# Patient Record
Sex: Female | Born: 1945 | Race: White | Hispanic: No | Marital: Married | Smoking: Never smoker
Health system: Southern US, Community
[De-identification: ages and names within clinical notes are randomized; demographics above are authoritative.]

## PROBLEM LIST (undated history)

## (undated) DIAGNOSIS — Z8489 Family history of other specified conditions: Secondary | ICD-10-CM

## (undated) DIAGNOSIS — K224 Dyskinesia of esophagus: Secondary | ICD-10-CM

## (undated) DIAGNOSIS — R011 Cardiac murmur, unspecified: Secondary | ICD-10-CM

## (undated) DIAGNOSIS — M5030 Other cervical disc degeneration, unspecified cervical region: Secondary | ICD-10-CM

## (undated) DIAGNOSIS — H18509 Unspecified hereditary corneal dystrophies, unspecified eye: Secondary | ICD-10-CM

## (undated) DIAGNOSIS — R1310 Dysphagia, unspecified: Secondary | ICD-10-CM

## (undated) DIAGNOSIS — F419 Anxiety disorder, unspecified: Secondary | ICD-10-CM

## (undated) DIAGNOSIS — B059 Measles without complication: Secondary | ICD-10-CM

## (undated) DIAGNOSIS — R112 Nausea with vomiting, unspecified: Secondary | ICD-10-CM

## (undated) DIAGNOSIS — K219 Gastro-esophageal reflux disease without esophagitis: Secondary | ICD-10-CM

## (undated) DIAGNOSIS — M5440 Lumbago with sciatica, unspecified side: Secondary | ICD-10-CM

## (undated) DIAGNOSIS — H9190 Unspecified hearing loss, unspecified ear: Secondary | ICD-10-CM

## (undated) DIAGNOSIS — G9050 Complex regional pain syndrome I, unspecified: Secondary | ICD-10-CM

## (undated) DIAGNOSIS — G629 Polyneuropathy, unspecified: Secondary | ICD-10-CM

## (undated) DIAGNOSIS — I499 Cardiac arrhythmia, unspecified: Secondary | ICD-10-CM

## (undated) DIAGNOSIS — H409 Unspecified glaucoma: Secondary | ICD-10-CM

## (undated) DIAGNOSIS — Z9889 Other specified postprocedural states: Secondary | ICD-10-CM

## (undated) DIAGNOSIS — J479 Bronchiectasis, uncomplicated: Secondary | ICD-10-CM

## (undated) DIAGNOSIS — Z8719 Personal history of other diseases of the digestive system: Secondary | ICD-10-CM

## (undated) DIAGNOSIS — Z85820 Personal history of malignant melanoma of skin: Secondary | ICD-10-CM

## (undated) DIAGNOSIS — M1990 Unspecified osteoarthritis, unspecified site: Secondary | ICD-10-CM

## (undated) DIAGNOSIS — I781 Nevus, non-neoplastic: Secondary | ICD-10-CM

## (undated) DIAGNOSIS — K5790 Diverticulosis of intestine, part unspecified, without perforation or abscess without bleeding: Secondary | ICD-10-CM

## (undated) DIAGNOSIS — H1850 Unspecified hereditary corneal dystrophies: Secondary | ICD-10-CM

## (undated) DIAGNOSIS — M810 Age-related osteoporosis without current pathological fracture: Secondary | ICD-10-CM

## (undated) DIAGNOSIS — R351 Nocturia: Secondary | ICD-10-CM

## (undated) DIAGNOSIS — I8390 Asymptomatic varicose veins of unspecified lower extremity: Secondary | ICD-10-CM

## (undated) DIAGNOSIS — F4310 Post-traumatic stress disorder, unspecified: Secondary | ICD-10-CM

## (undated) DIAGNOSIS — L719 Rosacea, unspecified: Secondary | ICD-10-CM

## (undated) DIAGNOSIS — Z8619 Personal history of other infectious and parasitic diseases: Secondary | ICD-10-CM

## (undated) DIAGNOSIS — M109 Gout, unspecified: Secondary | ICD-10-CM

## (undated) DIAGNOSIS — R32 Unspecified urinary incontinence: Secondary | ICD-10-CM

## (undated) DIAGNOSIS — E039 Hypothyroidism, unspecified: Secondary | ICD-10-CM

## (undated) DIAGNOSIS — I341 Nonrheumatic mitral (valve) prolapse: Secondary | ICD-10-CM

## (undated) DIAGNOSIS — T4145XA Adverse effect of unspecified anesthetic, initial encounter: Secondary | ICD-10-CM

## (undated) DIAGNOSIS — H269 Unspecified cataract: Secondary | ICD-10-CM

## (undated) DIAGNOSIS — T8859XA Other complications of anesthesia, initial encounter: Secondary | ICD-10-CM

## (undated) HISTORY — DX: Bronchiectasis, uncomplicated: J47.9

## (undated) HISTORY — PX: OTHER SURGICAL HISTORY: SHX169

## (undated) HISTORY — DX: Personal history of malignant melanoma of skin: Z85.820

## (undated) NOTE — *Deleted (*Deleted)
Anaphylactic shock due to food Avoid peanut, tree nuts, egg, milk, fish, shellfish,and pork. In case of an allergic reaction, give Benadryl *** {Blank single:19197::"teaspoonful","teaspoonfuls","capsules"} every {blank single:19197::"4","6"} hours, and if life-threatening symptoms occur, inject with {Blank single:19197::"EpiPen 0.3 mg","EpiPen 0.15 mg","AuviQ 0.3 mg","AuviQ 0.15 mg","AuviQ 0.10 mg"}.  Flexural atopic dermatitis Take a daily bath for 10 minutes, then pat dry and use hydrocortisone 2.5% cream twice a day as needed to red itchy areas. Wait 10 minutes and then use Eucerin cream on the bad areas of eczema and use Eucerin lotion on the other areas.  Allergic rhinitis (dust mite, cat and dog) Continue cetirizine 1 teaspoon twice day as need for itchy skin or runny nose  Celiac disease Continue to avoid gluten and also oats  Please let us know if this treatment plan is not working well for you Schedule follow up appointment in

---

## 1991-02-25 DIAGNOSIS — G9050 Complex regional pain syndrome I, unspecified: Secondary | ICD-10-CM

## 1991-02-25 HISTORY — DX: Complex regional pain syndrome I, unspecified: G90.50

## 2004-02-25 DIAGNOSIS — F4310 Post-traumatic stress disorder, unspecified: Secondary | ICD-10-CM

## 2004-02-25 HISTORY — PX: HIP FRACTURE SURGERY: SHX118

## 2004-02-25 HISTORY — DX: Post-traumatic stress disorder, unspecified: F43.10

## 2013-06-06 ENCOUNTER — Other Ambulatory Visit (HOSPITAL_COMMUNITY): Payer: Self-pay | Admitting: Internal Medicine

## 2013-06-06 DIAGNOSIS — R1310 Dysphagia, unspecified: Secondary | ICD-10-CM

## 2013-06-09 ENCOUNTER — Ambulatory Visit (HOSPITAL_COMMUNITY)
Admission: RE | Admit: 2013-06-09 | Discharge: 2013-06-09 | Disposition: A | Discharge status: Home / Self Care | Payer: Medicare Other | Source: Ambulatory Visit | Attending: Internal Medicine | Admitting: Internal Medicine

## 2013-06-09 DIAGNOSIS — K219 Gastro-esophageal reflux disease without esophagitis: Secondary | ICD-10-CM | POA: Insufficient documentation

## 2013-06-09 DIAGNOSIS — K2289 Other specified disease of esophagus: Secondary | ICD-10-CM | POA: Insufficient documentation

## 2013-06-09 DIAGNOSIS — K228 Other specified diseases of esophagus: Secondary | ICD-10-CM | POA: Insufficient documentation

## 2013-06-09 DIAGNOSIS — K449 Diaphragmatic hernia without obstruction or gangrene: Secondary | ICD-10-CM | POA: Insufficient documentation

## 2013-06-09 DIAGNOSIS — R1310 Dysphagia, unspecified: Secondary | ICD-10-CM

## 2013-08-17 ENCOUNTER — Other Ambulatory Visit (HOSPITAL_COMMUNITY): Payer: Self-pay | Admitting: Internal Medicine

## 2013-08-17 DIAGNOSIS — R1012 Left upper quadrant pain: Secondary | ICD-10-CM

## 2013-08-17 DIAGNOSIS — R1031 Right lower quadrant pain: Secondary | ICD-10-CM

## 2013-08-22 ENCOUNTER — Ambulatory Visit (HOSPITAL_COMMUNITY)
Admission: RE | Admit: 2013-08-22 | Discharge: 2013-08-22 | Disposition: A | Discharge status: Home / Self Care | Payer: Medicare Other | Source: Ambulatory Visit | Attending: Internal Medicine | Admitting: Internal Medicine

## 2013-08-22 DIAGNOSIS — R1012 Left upper quadrant pain: Secondary | ICD-10-CM

## 2013-08-22 DIAGNOSIS — R1031 Right lower quadrant pain: Secondary | ICD-10-CM

## 2013-08-22 DIAGNOSIS — K824 Cholesterolosis of gallbladder: Secondary | ICD-10-CM | POA: Insufficient documentation

## 2013-09-05 ENCOUNTER — Ambulatory Visit: Payer: Medicare Other | Attending: Orthopedic Surgery | Admitting: Physical Therapy

## 2013-09-05 DIAGNOSIS — M545 Low back pain, unspecified: Secondary | ICD-10-CM | POA: Diagnosis not present

## 2013-09-07 ENCOUNTER — Ambulatory Visit: Payer: Medicare Other | Admitting: Physical Therapy

## 2013-09-07 DIAGNOSIS — M545 Low back pain, unspecified: Secondary | ICD-10-CM | POA: Diagnosis not present

## 2013-09-13 ENCOUNTER — Ambulatory Visit: Payer: Medicare Other | Admitting: Physical Therapy

## 2013-09-15 ENCOUNTER — Ambulatory Visit: Payer: Medicare Other | Admitting: Physical Therapy

## 2013-09-15 DIAGNOSIS — M545 Low back pain, unspecified: Secondary | ICD-10-CM | POA: Diagnosis not present

## 2013-09-20 ENCOUNTER — Ambulatory Visit: Payer: Medicare Other | Admitting: Physical Therapy

## 2013-09-20 DIAGNOSIS — M545 Low back pain, unspecified: Secondary | ICD-10-CM | POA: Diagnosis not present

## 2013-09-22 ENCOUNTER — Other Ambulatory Visit: Payer: Self-pay | Admitting: Orthopedic Surgery

## 2013-09-22 ENCOUNTER — Ambulatory Visit: Payer: Medicare Other | Admitting: Physical Therapy

## 2013-09-22 DIAGNOSIS — M545 Low back pain, unspecified: Secondary | ICD-10-CM | POA: Diagnosis not present

## 2013-09-27 ENCOUNTER — Ambulatory Visit: Payer: Medicare Other | Attending: Orthopedic Surgery | Admitting: Physical Therapy

## 2013-09-27 DIAGNOSIS — M545 Low back pain, unspecified: Secondary | ICD-10-CM | POA: Insufficient documentation

## 2013-09-29 ENCOUNTER — Ambulatory Visit: Payer: Medicare Other | Admitting: Physical Therapy

## 2013-09-29 DIAGNOSIS — M545 Low back pain, unspecified: Secondary | ICD-10-CM | POA: Diagnosis not present

## 2013-10-04 ENCOUNTER — Ambulatory Visit: Payer: Medicare Other | Admitting: Physical Therapy

## 2013-10-04 ENCOUNTER — Encounter (HOSPITAL_COMMUNITY): Payer: Medicare Other | Admitting: Pharmacy Technician

## 2013-10-04 DIAGNOSIS — M545 Low back pain, unspecified: Secondary | ICD-10-CM | POA: Diagnosis not present

## 2013-10-06 ENCOUNTER — Ambulatory Visit: Payer: Medicare Other | Admitting: Physical Therapy

## 2013-10-06 DIAGNOSIS — M545 Low back pain, unspecified: Secondary | ICD-10-CM | POA: Diagnosis not present

## 2013-10-06 NOTE — Patient Instructions (Addendum)
Brooke Jimenez  10/06/2013   Your procedure is scheduled on:  10/17/2013    Report to [facility].  Follow the Signs to [place] at  105pm  Call this number if you have problems the morning of surgery: [phone]   Remember:   Do not eat food after midnite.  May have clear liquids until 0830am then npo.    Take these medicines the morning of surgery with A SIP OF WATER:    Do not wear jewelry, make-up or nail polish.  Do not wear lotions, powders, or perfumes.,  deodorant    Do not shave 48 hours prior to surgery.   Do not bring valuables to the hospital.  Contacts, dentures or bridgework may not be worn into surgery.       Patients discharged the day of surgery will not be allowed to drive  home.  Name and phone number of your driver:       Please read over the following fact sheets that you were given: [facility] - Preparing for Surgery Before surgery, you can play an important role.  Because skin is not sterile, your skin needs to be as free of germs as possible.  You can reduce the number of germs on your skin by washing with CHG (chlorahexidine gluconate) soap before surgery.  CHG is an antiseptic cleaner which kills germs and bonds with the skin to continue killing germs even after washing. Please DO NOT use if you have an allergy to CHG or antibacterial soaps.  If your skin becomes reddened/irritated stop using the CHG and inform your nurse when you arrive at Short Stay. Do not shave (including legs and underarms) for at least 48 hours prior to the first CHG shower.  You may shave your face/neck. Please follow these instructions carefully:  1.  Shower with CHG Soap the night before surgery and the  morning of Surgery.  2.  If you choose to wash your hair, wash your hair first as usual with your  normal  shampoo.  3.  After you shampoo, rinse your hair and body thoroughly to remove the  shampoo.                           4.  Use CHG as you would any other  liquid soap.  You can apply chg directly  to the skin and wash                       Gently with a scrungie or clean washcloth.  5.  Apply the CHG Soap to your body ONLY FROM THE NECK DOWN.   Do not use on face/ open                           Wound or open sores. Avoid contact with eyes, ears mouth and genitals (private parts).                       Wash face,  Genitals (private parts) with your normal soap.             6.  Wash thoroughly, paying special attention to the area where your surgery  will be performed.  7.  Thoroughly rinse your body with warm water from the neck down.  8.  DO NOT shower/wash with your normal soap after using and rinsing  off  the CHG Soap.                9.  Pat yourself dry with a clean towel.            10.  Wear clean pajamas.            11.  Place clean sheets on your bed the night of your first shower and do not  sleep with pets. Day of Surgery : Do not apply any lotions/deodorants the morning of surgery.  Please wear clean clothes to the hospital/surgery center.  FAILURE TO FOLLOW THESE INSTRUCTIONS MAY RESULT IN THE CANCELLATION OF YOUR SURGERY PATIENT SIGNATURE_________________________________  NURSE SIGNATURE__________________________________  ________________________________________________________________________   Brooke Jimenez  An incentive spirometer is a tool that can help keep your lungs clear and active. This tool measures how well you are filling your lungs with each breath. Taking long deep breaths may help reverse or decrease the chance of developing breathing (pulmonary) problems (especially infection) following:  A long period of time when you are unable to move or be active. BEFORE THE PROCEDURE   If the spirometer includes an indicator to show your best effort, your nurse or respiratory therapist will set it to a desired goal.  If possible, sit up straight or lean slightly forward. Try not to slouch.  Hold the incentive  spirometer in an upright position. INSTRUCTIONS FOR USE  1. Sit on the edge of your bed if possible, or sit up as far as you can in bed or on a chair. 2. Hold the incentive spirometer in an upright position. 3. Breathe out normally. 4. Place the mouthpiece in your mouth and seal your lips tightly around it. 5. Breathe in slowly and as deeply as possible, raising the piston or the ball toward the top of the column. 6. Hold your breath for 3-5 seconds or for as long as possible. Allow the piston or ball to fall to the bottom of the column. 7. Remove the mouthpiece from your mouth and breathe out normally. 8. Rest for a few seconds and repeat Steps 1 through 7 at least 10 times every 1-2 hours when you are awake. Take your time and take a few normal breaths between deep breaths. 9. The spirometer may include an indicator to show your best effort. Use the indicator as a goal to work toward during each repetition. 10. After each set of 10 deep breaths, practice coughing to be sure your lungs are clear. If you have an incision (the cut made at the time of surgery), support your incision when coughing by placing a pillow or rolled up towels firmly against it. Once you are able to get out of bed, walk around indoors and cough well. You may stop using the incentive spirometer when instructed by your caregiver.  RISKS AND COMPLICATIONS  Take your time so you do not get dizzy or light-headed.  If you are in pain, you may need to take or ask for pain medication before doing incentive spirometry. It is harder to take a deep breath if you are having pain. AFTER USE  Rest and breathe slowly and easily.  It can be helpful to keep track of a log of your progress. Your caregiver can provide you with a simple table to help with this. If you are using the spirometer at home, follow these instructions: [place] IF:   You are having difficultly using the spirometer.  You have trouble using the  spirometer as often as instructed.  Your pain medication is not giving enough relief while using the spirometer.  You develop fever of 100.5 F (38.1 C) or higher. SEEK IMMEDIATE MEDICAL CARE IF:   You cough up bloody sputum that had not been present before.  You develop fever of 102 F (38.9 C) or greater.  You develop worsening pain at or near the incision site. MAKE SURE YOU:   Understand these instructions.  Will watch your condition.  Will get help right away if you are not doing well or get worse. Document Released: 06/23/2006 Document Revised: 05/05/2011 Document Reviewed: 08/24/2006 ExitCare Patient Information 2014 Exit[employer], [place].   ________________________________________________________________________ , coughing and deep breathing exercises, leg exercises               CLEAR LIQUID DIET   Foods Allowed                                                                     Foods Excluded  Coffee and tea, regular and decaf                             liquids that you cannot  Plain Jell-O in any flavor                                             see through such as: Fruit ices (not with fruit pulp)                                     milk, soups, orange juice  Iced Popsicles                                    All solid food Carbonated beverages, regular and diet                                    Cranberry, grape and apple juices Sports drinks like Gatorade Lightly seasoned clear broth or consume(fat free) Sugar, honey syrup  Sample Menu Breakfast                                Lunch                                     Supper Cranberry juice                    Beef broth                            Chicken broth Jell-O  Grape juice                           Apple juice Coffee or tea                        Jell-O                                      Popsicle                                                Coffee or tea                         Coffee or tea  _____________________________________________________________________

## 2013-10-07 ENCOUNTER — Encounter (HOSPITAL_COMMUNITY): Payer: Self-pay

## 2013-10-07 ENCOUNTER — Ambulatory Visit (HOSPITAL_COMMUNITY)
Admission: RE | Admit: 2013-10-07 | Discharge: 2013-10-07 | Disposition: A | Discharge status: Home / Self Care | Payer: Medicare Other | Source: Ambulatory Visit | Attending: Orthopedic Surgery | Admitting: Orthopedic Surgery

## 2013-10-07 ENCOUNTER — Encounter (HOSPITAL_COMMUNITY)
Admission: RE | Admit: 2013-10-07 | Discharge: 2013-10-07 | Disposition: A | Discharge status: Home / Self Care | Payer: Medicare Other | Source: Ambulatory Visit | Attending: Orthopedic Surgery | Admitting: Orthopedic Surgery

## 2013-10-07 DIAGNOSIS — Z01818 Encounter for other preprocedural examination: Secondary | ICD-10-CM | POA: Diagnosis present

## 2013-10-07 HISTORY — DX: Unspecified hereditary corneal dystrophies: H18.50

## 2013-10-07 HISTORY — DX: Post-traumatic stress disorder, unspecified: F43.10

## 2013-10-07 HISTORY — DX: Personal history of other diseases of the digestive system: Z87.19

## 2013-10-07 HISTORY — DX: Gastro-esophageal reflux disease without esophagitis: K21.9

## 2013-10-07 HISTORY — DX: Anxiety disorder, unspecified: F41.9

## 2013-10-07 HISTORY — DX: Unspecified hereditary corneal dystrophies, unspecified eye: H18.509

## 2013-10-07 HISTORY — DX: Nonrheumatic mitral (valve) prolapse: I34.1

## 2013-10-07 HISTORY — DX: Dysphagia, unspecified: R13.10

## 2013-10-07 HISTORY — DX: Unspecified osteoarthritis, unspecified site: M19.90

## 2013-10-07 HISTORY — DX: Hypothyroidism, unspecified: E03.9

## 2013-10-07 LAB — BASIC METABOLIC PANEL
ANION GAP: 11 (ref 5–15)
BUN: 13 mg/dL (ref 6–23)
CHLORIDE: 102 meq/L (ref 96–112)
CO2: 25 mEq/L (ref 19–32)
Calcium: 10.4 mg/dL (ref 8.4–10.5)
Creatinine, Ser: 0.63 mg/dL (ref 0.50–1.10)
GFR calc Af Amer: >90 mL/min (ref >90)
GFR calc non Af Amer: >90 mL/min (ref >90)
Glucose, Bld: 91 mg/dL (ref 70–99)
POTASSIUM: 4.4 meq/L (ref 3.7–5.3)
SODIUM: 138 meq/L (ref 137–147)

## 2013-10-07 LAB — CBC
HCT: 42.9 % (ref 36.0–46.0)
Hemoglobin: 14 g/dL (ref 12.0–15.0)
MCH: 29 pg (ref 26.0–34.0)
MCHC: 32.6 g/dL (ref 30.0–36.0)
MCV: 88.8 fL (ref 78.0–100.0)
Platelets: 258 10*3/uL (ref 150–400)
RBC: 4.83 MIL/uL (ref 3.87–5.11)
RDW: 13.8 % (ref 11.5–15.5)
WBC: 7.5 10*3/uL (ref 4.0–10.5)

## 2013-10-07 NOTE — Progress Notes (Signed)
Please see ultrasound done 06/09/2013 in [facility].    Due to chronic dysphagia

## 2013-10-07 NOTE — Progress Notes (Signed)
Patient has allergy to Penicillin- causes rash .  Ancef ordered preop

## 2013-10-07 NOTE — Progress Notes (Signed)
EKG and CXR done 10/07/13 in [facility].

## 2013-10-11 ENCOUNTER — Ambulatory Visit: Payer: Medicare Other | Admitting: Physical Therapy

## 2013-10-11 DIAGNOSIS — M545 Low back pain, unspecified: Secondary | ICD-10-CM | POA: Diagnosis not present

## 2013-10-12 ENCOUNTER — Ambulatory Visit: Payer: Medicare Other | Admitting: Physical Therapy

## 2013-10-12 DIAGNOSIS — M545 Low back pain, unspecified: Secondary | ICD-10-CM | POA: Diagnosis not present

## 2013-10-16 NOTE — H&P (Signed)
  CC- Brooke Jimenez is a 68 y.o. female who presents with left hip pain  Hip Pain: Patient complains of left hip pain. Onset of the symptoms was several years ago. Inciting event: pinning of left hip fracture. Current symptoms include lateral hip and groin pain whic worsens with lying on left side. She also has mild OA of the hip and an intraarticular injection was performed to help differentiate intra vs extraarticular source of pain. She di not have any improvement withh the injection suggesting that the problem was not coming from her joint.She has palpable tenderness over the greqter trochanter consistent with being from the hip hardware.She presents now for hardware removal of the left hip..   Past Medical History  Diagnosis Date  . Mitral valve prolapse   . Hypothyroidism   . Anxiety   . Urinary frequency   . GERD (gastroesophageal reflux disease)   . H/O hiatal hernia   . Arthritis   . Cancer     melanoma- 1986 and 1997   . PTSD (post-traumatic stress disorder) 2006  . Corneal dystrophy   . Dysphagia     chronic- please see ultrasound done 4/16 15 in [facility]     Past Surgical History  Procedure Laterality Date  . Joint replacement      left hip replacement   . Melanoma surgery       Prior to Admission medications   Medication Sig Start Date End Date Taking? Authorizing Provider  acetaminophen (TYLENOL) 500 MG tablet Take 500 mg by mouth every 6 (six) hours as needed for mild pain.    Historical Provider, MD  clindamycin (CLEOCIN) 150 MG capsule Take 600 mg by mouth once.    Historical Provider, MD  ibuprofen (ADVIL,MOTRIN) 200 MG tablet Take 200 mg by mouth every 6 (six) hours as needed for mild pain or moderate pain.    Historical Provider, MD  levothyroxine (SYNTHROID, LEVOTHROID) 50 MCG tablet Take 50 mcg by mouth daily before breakfast.    Historical Provider, MD  minocycline (MINOCIN,DYNACIN) 50 MG capsule Take 50 mg by mouth 2 (two) times daily.    Historical Provider,  MD    Physical Examination: General appearance - alert, well appearing, and in no distress Mental status - alert, oriented to person, place, and time Chest - clear to auscultation, no wheezes, rales or rhonchi, symmetric air entry Heart - normal rate, regular rhythm, normal S1, S2, no murmurs, rubs, clicks or gallops Abdomen - soft, nontender, nondistended, no masses or organomegaly Neurological - alert, oriented, normal speech, no focal findings or movement disorder noted  A left hip exam was performed. SKIN: intact SWELLING: none WARMTH: no warmth TENDERNESS: maximal at greater trochanter ROM: normal STRENGTH: normal GAIT: antalgic  ASSESSMENT:Left hip pain- Plan removal of hardware (cannulated screws) left hip. Discussed procedure, risks, potential complications and rehab course with patient who elects to proceed.  Plan  Dione Plover. Osha Errico, MD    10/16/2013, 8:55 PM

## 2013-10-17 ENCOUNTER — Ambulatory Visit (HOSPITAL_COMMUNITY): Payer: Medicare Other | Admitting: Anesthesiology

## 2013-10-17 ENCOUNTER — Ambulatory Visit (HOSPITAL_COMMUNITY)
Admission: RE | Admit: 2013-10-17 | Discharge: 2013-10-17 | Disposition: A | Discharge status: Home / Self Care | Payer: Medicare Other | Source: Ambulatory Visit | Attending: Orthopedic Surgery | Admitting: Orthopedic Surgery

## 2013-10-17 ENCOUNTER — Encounter (HOSPITAL_COMMUNITY)
Admission: RE | Disposition: A | Discharge status: Home / Self Care | Payer: Self-pay | Source: Ambulatory Visit | Attending: Orthopedic Surgery

## 2013-10-17 ENCOUNTER — Encounter (HOSPITAL_COMMUNITY): Payer: Medicare Other | Admitting: Anesthesiology

## 2013-10-17 ENCOUNTER — Encounter (HOSPITAL_COMMUNITY): Payer: Self-pay | Admitting: *Deleted

## 2013-10-17 DIAGNOSIS — K449 Diaphragmatic hernia without obstruction or gangrene: Secondary | ICD-10-CM | POA: Insufficient documentation

## 2013-10-17 DIAGNOSIS — F430 Acute stress reaction: Secondary | ICD-10-CM | POA: Insufficient documentation

## 2013-10-17 DIAGNOSIS — Y9210 Unspecified residential institution as the place of occurrence of the external cause: Secondary | ICD-10-CM | POA: Insufficient documentation

## 2013-10-17 DIAGNOSIS — Y831 Surgical operation with implant of artificial internal device as the cause of abnormal reaction of the patient, or of later complication, without mention of misadventure at the time of the procedure: Secondary | ICD-10-CM | POA: Diagnosis not present

## 2013-10-17 DIAGNOSIS — F411 Generalized anxiety disorder: Secondary | ICD-10-CM | POA: Diagnosis not present

## 2013-10-17 DIAGNOSIS — E039 Hypothyroidism, unspecified: Secondary | ICD-10-CM | POA: Insufficient documentation

## 2013-10-17 DIAGNOSIS — I059 Rheumatic mitral valve disease, unspecified: Secondary | ICD-10-CM | POA: Insufficient documentation

## 2013-10-17 DIAGNOSIS — Z79899 Other long term (current) drug therapy: Secondary | ICD-10-CM | POA: Insufficient documentation

## 2013-10-17 DIAGNOSIS — T8489XA Other specified complication of internal orthopedic prosthetic devices, implants and grafts, initial encounter: Secondary | ICD-10-CM | POA: Diagnosis not present

## 2013-10-17 DIAGNOSIS — K219 Gastro-esophageal reflux disease without esophagitis: Secondary | ICD-10-CM | POA: Insufficient documentation

## 2013-10-17 DIAGNOSIS — T8484XA Pain due to internal orthopedic prosthetic devices, implants and grafts, initial encounter: Secondary | ICD-10-CM | POA: Diagnosis present

## 2013-10-17 DIAGNOSIS — M25559 Pain in unspecified hip: Secondary | ICD-10-CM | POA: Insufficient documentation

## 2013-10-17 DIAGNOSIS — T8484XD Pain due to internal orthopedic prosthetic devices, implants and grafts, subsequent encounter: Secondary | ICD-10-CM

## 2013-10-17 HISTORY — DX: Unspecified hearing loss, unspecified ear: H91.90

## 2013-10-17 HISTORY — PX: HARDWARE REMOVAL: SHX979

## 2013-10-17 SURGERY — REMOVAL, HARDWARE
Anesthesia: General | Site: Hip | Laterality: Left

## 2013-10-17 MED ORDER — HYDROMORPHONE HCL PF 1 MG/ML IJ SOLN: 0.2500 mg | INTRAMUSCULAR | Status: DC | PRN

## 2013-10-17 MED ORDER — SUCCINYLCHOLINE CHLORIDE 20 MG/ML IJ SOLN
INTRAMUSCULAR | Status: DC | PRN
  Administered 2013-10-17: 100 mg via INTRAVENOUS

## 2013-10-17 MED ORDER — LIDOCAINE HCL (CARDIAC) 20 MG/ML IV SOLN
INTRAVENOUS | Status: AC
  Filled 2013-10-17: qty 5

## 2013-10-17 MED ORDER — DEXAMETHASONE SODIUM PHOSPHATE 10 MG/ML IJ SOLN: 10.0000 mg | Freq: Once | INTRAMUSCULAR | Status: DC

## 2013-10-17 MED ORDER — SODIUM CHLORIDE 0.9 % IJ SOLN
INTRAMUSCULAR | Status: AC
  Filled 2013-10-17: qty 50

## 2013-10-17 MED ORDER — CEFAZOLIN SODIUM-DEXTROSE 2-3 GM-% IV SOLR
INTRAVENOUS | Status: AC
  Filled 2013-10-17: qty 50

## 2013-10-17 MED ORDER — TRAMADOL HCL 50 MG PO TABS: 50.0000 mg | ORAL_TABLET | Freq: Four times a day (QID) | ORAL | Status: DC | PRN

## 2013-10-17 MED ORDER — MIDAZOLAM HCL 5 MG/5ML IJ SOLN
INTRAMUSCULAR | Status: DC | PRN
  Administered 2013-10-17: 2 mg via INTRAVENOUS

## 2013-10-17 MED ORDER — FENTANYL CITRATE 0.05 MG/ML IJ SOLN
INTRAMUSCULAR | Status: DC | PRN
  Administered 2013-10-17 (×2): 50 ug via INTRAVENOUS

## 2013-10-17 MED ORDER — MIDAZOLAM HCL 2 MG/2ML IJ SOLN
INTRAMUSCULAR | Status: AC
  Filled 2013-10-17: qty 2

## 2013-10-17 MED ORDER — DEXAMETHASONE SODIUM PHOSPHATE 10 MG/ML IJ SOLN
INTRAMUSCULAR | Status: DC | PRN
  Administered 2013-10-17: 10 mg via INTRAVENOUS

## 2013-10-17 MED ORDER — PROPOFOL 10 MG/ML IV BOLUS
INTRAVENOUS | Status: DC | PRN
  Administered 2013-10-17: 150 mg via INTRAVENOUS

## 2013-10-17 MED ORDER — DEXAMETHASONE SODIUM PHOSPHATE 10 MG/ML IJ SOLN
INTRAMUSCULAR | Status: AC
  Filled 2013-10-17: qty 1

## 2013-10-17 MED ORDER — PROPOFOL 10 MG/ML IV BOLUS
INTRAVENOUS | Status: AC
  Filled 2013-10-17: qty 20

## 2013-10-17 MED ORDER — ONDANSETRON HCL 4 MG/2ML IJ SOLN
INTRAMUSCULAR | Status: DC | PRN
  Administered 2013-10-17: 4 mg via INTRAVENOUS

## 2013-10-17 MED ORDER — EPHEDRINE SULFATE 50 MG/ML IJ SOLN
INTRAMUSCULAR | Status: DC | PRN
  Administered 2013-10-17 (×3): 5 mg via INTRAVENOUS

## 2013-10-17 MED ORDER — FENTANYL CITRATE 0.05 MG/ML IJ SOLN
INTRAMUSCULAR | Status: AC
  Filled 2013-10-17: qty 5

## 2013-10-17 MED ORDER — LACTATED RINGERS IV SOLN
INTRAVENOUS | Status: DC | PRN
  Administered 2013-10-17: 15:00:00 via INTRAVENOUS

## 2013-10-17 MED ORDER — ONDANSETRON HCL 4 MG/2ML IJ SOLN
INTRAMUSCULAR | Status: AC
  Filled 2013-10-17: qty 2

## 2013-10-17 MED ORDER — PROMETHAZINE HCL 25 MG/ML IJ SOLN: 6.2500 mg | INTRAMUSCULAR | Status: DC | PRN

## 2013-10-17 MED ORDER — BUPIVACAINE HCL (PF) 0.25 % IJ SOLN
INTRAMUSCULAR | Status: AC
  Filled 2013-10-17: qty 30

## 2013-10-17 MED ORDER — METHOCARBAMOL 500 MG PO TABS: 500.0000 mg | ORAL_TABLET | Freq: Four times a day (QID) | ORAL | Status: DC

## 2013-10-17 MED ORDER — ACETAMINOPHEN 10 MG/ML IV SOLN
1000.0000 mg | Freq: Once | INTRAVENOUS | Status: AC
  Administered 2013-10-17: 1000 mg via INTRAVENOUS
  Filled 2013-10-17: qty 100

## 2013-10-17 MED ORDER — 0.9 % SODIUM CHLORIDE (POUR BTL) OPTIME
TOPICAL | Status: DC | PRN
  Administered 2013-10-17: 1000 mL

## 2013-10-17 MED ORDER — LIDOCAINE HCL (CARDIAC) 20 MG/ML IV SOLN
INTRAVENOUS | Status: DC | PRN
  Administered 2013-10-17: 80 mg via INTRAVENOUS

## 2013-10-17 MED ORDER — CEFAZOLIN SODIUM-DEXTROSE 2-3 GM-% IV SOLR
2.0000 g | INTRAVENOUS | Status: AC
  Administered 2013-10-17: 2 g via INTRAVENOUS

## 2013-10-17 MED ORDER — SODIUM CHLORIDE 0.9 % IV SOLN: INTRAVENOUS | Status: DC

## 2013-10-17 MED ORDER — CHLORHEXIDINE GLUCONATE 4 % EX LIQD: 60.0000 mL | Freq: Once | CUTANEOUS | Status: DC

## 2013-10-17 SURGICAL SUPPLY — 43 items
BANDAGE ELASTIC 6 VELCRO ST LF (GAUZE/BANDAGES/DRESSINGS) IMPLANT
BANDAGE ESMARK 6X9 LF (GAUZE/BANDAGES/DRESSINGS) IMPLANT
BNDG ESMARK 6X9 LF (GAUZE/BANDAGES/DRESSINGS)
CUFF TOURN SGL QUICK 18 (TOURNIQUET CUFF) IMPLANT
CUFF TOURN SGL QUICK 34 (TOURNIQUET CUFF)
CUFF TRNQT CYL 34X4X40X1 (TOURNIQUET CUFF) IMPLANT
DRAPE C-ARM 42X120 X-RAY (DRAPES) IMPLANT
DRAPE C-ARMOR (DRAPES) IMPLANT
DRAPE EXTREMITY T 121X128X90 (DRAPE) ×2 IMPLANT
DRAPE INCISE IOBAN 66X45 STRL (DRAPES) ×2 IMPLANT
DRAPE ORTHO SPLIT 77X108 STRL (DRAPES) ×2
DRAPE SURG ORHT 6 SPLT 77X108 (DRAPES) ×2 IMPLANT
DRSG ADAPTIC 3X8 NADH LF (GAUZE/BANDAGES/DRESSINGS) IMPLANT
DRSG MEPILEX BORDER 4X4 (GAUZE/BANDAGES/DRESSINGS) ×2 IMPLANT
DRSG PAD ABDOMINAL 8X10 ST (GAUZE/BANDAGES/DRESSINGS) IMPLANT
DURAPREP 26ML APPLICATOR (WOUND CARE) ×2 IMPLANT
ELECT REM PT RETURN 9FT ADLT (ELECTROSURGICAL) ×2
ELECTRODE REM PT RTRN 9FT ADLT (ELECTROSURGICAL) ×1 IMPLANT
GAUZE SPONGE 4X4 12PLY STRL (GAUZE/BANDAGES/DRESSINGS) IMPLANT
GLOVE BIO SURGEON STRL SZ7.5 (GLOVE) IMPLANT
GLOVE BIO SURGEON STRL SZ8 (GLOVE) IMPLANT
GLOVE BIOGEL PI IND STRL 8 (GLOVE) ×3 IMPLANT
GLOVE BIOGEL PI INDICATOR 8 (GLOVE) ×3
GOWN STRL REUS W/TWL LRG LVL3 (GOWN DISPOSABLE) ×2 IMPLANT
GOWN STRL REUS W/TWL XL LVL3 (GOWN DISPOSABLE) ×2 IMPLANT
KIT BASIN OR (CUSTOM PROCEDURE TRAY) ×2 IMPLANT
MANIFOLD NEPTUNE II (INSTRUMENTS) ×2 IMPLANT
NEEDLE HYPO 22GX1.5 SAFETY (NEEDLE) ×2 IMPLANT
NS IRRIG 1000ML POUR BTL (IV SOLUTION) ×2 IMPLANT
PACK TOTAL JOINT (CUSTOM PROCEDURE TRAY) ×2 IMPLANT
PADDING CAST COTTON 6X4 STRL (CAST SUPPLIES) IMPLANT
POSITIONER SURGICAL ARM (MISCELLANEOUS) ×2 IMPLANT
STAPLER VISISTAT 35W (STAPLE) IMPLANT
STRIP CLOSURE SKIN 1/2X4 (GAUZE/BANDAGES/DRESSINGS) ×2 IMPLANT
SUT MNCRL AB 4-0 PS2 18 (SUTURE) ×2 IMPLANT
SUT VIC AB 0 CT1 36 (SUTURE) IMPLANT
SUT VIC AB 1 CT1 36 (SUTURE) ×2 IMPLANT
SUT VIC AB 2-0 CT1 27 (SUTURE) ×2
SUT VIC AB 2-0 CT1 TAPERPNT 27 (SUTURE) ×2 IMPLANT
SYRINGE 20CC LL (MISCELLANEOUS) ×2 IMPLANT
TOWEL OR 17X26 10 PK STRL BLUE (TOWEL DISPOSABLE) ×2 IMPLANT
UNDERPAD 30X30 INCONTINENT (UNDERPADS AND DIAPERS) IMPLANT
WATER STERILE IRR 1500ML POUR (IV SOLUTION) IMPLANT

## 2013-10-17 NOTE — Brief Op Note (Signed)
10/17/2013  5:14 PM  PATIENT:  Brooke Jimenez  68 y.o. female  PRE-OPERATIVE DIAGNOSIS:  PAINFUL HARDWARE OF LEFT HIP  POST-OPERATIVE DIAGNOSIS:  Hardware removal of left hip  PROCEDURE:  Procedure(s): HARDWARE REMOVAL LEFT HIP (Left)  SURGEON:  Surgeon(s) and Role:    Gearlean Alf, MD - Primary  PHYSICIAN ASSISTANT:   ASSISTANTS: none   ANESTHESIA:   general  EBL:  Total I/O In: 1000 [I.V.:1000] Out: 25 [Blood:25]  DRAINS: none   LOCAL MEDICATIONS USED:  MARCAINE     COUNTS:  YES  TOURNIQUET:  * No tourniquets in log *  DICTATION: .Other Dictation: Dictation Number [id number]  PLAN OF CARE: Discharge to home after PACU  PATIENT DISPOSITION:  PACU - hemodynamically stable.

## 2013-10-17 NOTE — Interval H&P Note (Signed)
History and Physical Interval Note:  10/17/2013 3:44 PM  Brooke Jimenez  has presented today for surgery, with the diagnosis of PAINFUL HARDWARE OF LEFT HIP  The various methods of treatment have been discussed with the patient and family. After consideration of risks, benefits and other options for treatment, the patient has consented to  Procedure(s): HARDWARE REMOVAL LEFT HIP (Left) as a surgical intervention .  The patient's history has been reviewed, patient examined, no change in status, stable for surgery.  I have reviewed the patient's chart and labs.  Questions were answered to the patient's satisfaction.     Gearlean Alf

## 2013-10-17 NOTE — Progress Notes (Signed)
[facility] is providing the following services: RW  If patient discharges after hours, please call [phone].   Linward Headland 10/17/2013, 4:00 PM

## 2013-10-17 NOTE — Op Note (Signed)
NAMEADDELYN, Brooke Jimenez NO.:  [id number]  MEDICAL RECORD NO.:  [id number]  LOCATION:  [facility]                         FACILITY:  [facility]  PHYSICIAN:  Gaynelle Arabian, M.D.    DATE OF BIRTH:  Jan 30, 1946  DATE OF PROCEDURE:  10/17/2013 DATE OF DISCHARGE:                              OPERATIVE REPORT   PREOPERATIVE DIAGNOSIS:  Painful hardware, left hip.  POSTOPERATIVE DIAGNOSIS:  Painful hardware, left hip.  PROCEDURE:  Hardware removal, left hip.  SURGEON:  Gaynelle Arabian, M.D.  ASSISTANT:  No assistant.  ANESTHESIA:  General.  ESTIMATED BLOOD LOSS:  Minimal.  DRAINS:  None.  COMPLICATIONS:  None.  CONDITION:  Stable to recovery.  BRIEF CLINICAL NOTE:  [name] is a 68 year old female, who had a left hip pinning several years ago due to a femoral neck fracture.  She has had significant hip pain and it has been questionable as to whether it is coming from the joint or related to the hardware.  Intra-articular injection did not provide much benefit, thus it was felt that the pain was hardware related.  We decided to remove the screws to address this issue.  She presents now for hardware removal.  PROCEDURE IN DETAIL:  After successful administration of general anesthetic, the patient was placed in the right lateral decubitus position on the left side up and held with the hip positioner.  The left lower extremity was isolated from perineum with plastic drapes and prepped and draped in usual sterile fashion.  About a 2 inch incision was made over the lateral aspect of the femur starting around the level of the vastus ridge of the femur.  Small incision made with a 10 blade in the subcutaneous tissue to the fascia lata which incised in line with the skin incision.  Two of the screws were palpable.  I identified the screw heads and removed them with a screwdriver.  Third screw was buried in the bone.  I could see a tiny piece of screw and I removed  bone overlying it to get to the screw and that was also removed.  The wound was then subsequently copiously irrigated with saline solution and the fascia lata closed with interrupted #1 Vicryl.  30 mL of 0.25% Marcaine had been injected into the fascia lata, subcu tissues.  Subcu was closed with #1 and 2-0 Vicryl and subcuticular running 4-0 Monocryl.  Incision was then cleaned and dried and Steri- Strips and a sterile dressing applied.  She was then awakened and transported to recovery in stable condition.     Gaynelle Arabian, M.D.     FA/MEDQ  D:  10/17/2013  T:  10/17/2013  Job:  [id number]

## 2013-10-17 NOTE — Transfer of Care (Signed)
Immediate Anesthesia Transfer of Care Note  Patient: Brooke Jimenez  Procedure(s) Performed: Procedure(s): HARDWARE REMOVAL LEFT HIP (Left)  Patient Location: PACU  Anesthesia Type:General  Level of Consciousness: awake, alert , oriented and patient cooperative  Airway & Oxygen Therapy: Patient Spontanous Breathing and Patient connected to face mask oxygen  Post-op Assessment: Report given to PACU RN, Post -op Vital signs reviewed and stable and Patient moving all extremities  Post vital signs: Reviewed and stable  Complications: No apparent anesthesia complications

## 2013-10-17 NOTE — Progress Notes (Signed)
Bilateral hearing aides returned to patient.

## 2013-10-17 NOTE — Anesthesia Preprocedure Evaluation (Addendum)
Anesthesia Evaluation  Patient identified by MRN, date of birth, ID band Patient awake    Reviewed: Allergy & Precautions, H&P , NPO status , Patient's Chart, lab work & pertinent test results  Airway Mallampati: II TM Distance: >3 FB Neck ROM: Full    Dental no notable dental hx.    Pulmonary neg pulmonary ROS,  breath sounds clear to auscultation  Pulmonary exam normal       Cardiovascular negative cardio ROS  Rhythm:Regular Rate:Normal     Neuro/Psych PSYCHIATRIC DISORDERS Anxiety negative neurological ROS     GI/Hepatic Neg liver ROS, hiatal hernia, GERD-  ,  Endo/Other  Hypothyroidism   Renal/GU negative Renal ROS  negative genitourinary   Musculoskeletal negative musculoskeletal ROS (+)   Abdominal   Peds negative pediatric ROS (+)  Hematology negative hematology ROS (+)   Anesthesia Other Findings   Reproductive/Obstetrics negative OB ROS                         Anesthesia Physical Anesthesia Plan  ASA: II  Anesthesia Plan: General   Post-op Pain Management:    Induction: Intravenous  Airway Management Planned: Oral ETT  Additional Equipment:   Intra-op Plan:   Post-operative Plan: Extubation in OR  Informed Consent: I have reviewed the patients History and Physical, chart, labs and discussed the procedure including the risks, benefits and alternatives for the proposed anesthesia with the patient or authorized representative who has indicated his/her understanding and acceptance.   Dental advisory given  Plan Discussed with: CRNA  Anesthesia Plan Comments: (Discussed general and spinal. Plan general. No foley catheter. Plans to go home today.)       Anesthesia Quick Evaluation

## 2013-10-17 NOTE — Anesthesia Postprocedure Evaluation (Signed)
  Anesthesia Post-op Note  Patient: Brooke Jimenez  Procedure(s) Performed: Procedure(s) (LRB): HARDWARE REMOVAL LEFT HIP (Left)  Patient Location: PACU  Anesthesia Type: General  Level of Consciousness: awake and alert   Airway and Oxygen Therapy: Patient Spontanous Breathing  Post-op Pain: mild  Post-op Assessment: Post-op Vital signs reviewed, Patient's Cardiovascular Status Stable, Respiratory Function Stable, Patent Airway and No signs of Nausea or vomiting  Last Vitals:  Filed Vitals:   10/17/13 1832  BP: 140/69  Pulse: 96  Temp:   Resp: 20    Post-op Vital Signs: stable   Complications: No apparent anesthesia complications

## 2013-10-18 ENCOUNTER — Encounter (HOSPITAL_COMMUNITY): Payer: Self-pay | Admitting: Orthopedic Surgery

## 2013-11-04 ENCOUNTER — Ambulatory Visit: Payer: Medicare Other | Attending: Orthopedic Surgery | Admitting: Physical Therapy

## 2013-11-04 DIAGNOSIS — M545 Low back pain, unspecified: Secondary | ICD-10-CM | POA: Insufficient documentation

## 2013-11-08 ENCOUNTER — Ambulatory Visit: Payer: Medicare Other | Admitting: Physical Therapy

## 2013-11-08 DIAGNOSIS — M545 Low back pain, unspecified: Secondary | ICD-10-CM | POA: Diagnosis not present

## 2013-11-10 ENCOUNTER — Ambulatory Visit: Payer: Medicare Other | Admitting: Physical Therapy

## 2013-11-10 DIAGNOSIS — M545 Low back pain, unspecified: Secondary | ICD-10-CM | POA: Diagnosis not present

## 2013-11-14 ENCOUNTER — Ambulatory Visit: Payer: Medicare Other | Admitting: Physical Therapy

## 2013-11-14 DIAGNOSIS — M545 Low back pain, unspecified: Secondary | ICD-10-CM | POA: Diagnosis not present

## 2013-11-17 ENCOUNTER — Ambulatory Visit: Payer: Medicare Other | Admitting: Physical Therapy

## 2013-11-17 DIAGNOSIS — M545 Low back pain, unspecified: Secondary | ICD-10-CM | POA: Diagnosis not present

## 2013-11-21 ENCOUNTER — Ambulatory Visit: Payer: Medicare Other | Admitting: Physical Therapy

## 2013-11-21 DIAGNOSIS — M545 Low back pain, unspecified: Secondary | ICD-10-CM | POA: Diagnosis not present

## 2013-11-23 ENCOUNTER — Encounter: Payer: Medicare Other | Admitting: Physical Therapy

## 2013-11-23 ENCOUNTER — Ambulatory Visit: Payer: Medicare Other | Admitting: Physical Therapy

## 2013-11-23 DIAGNOSIS — M545 Low back pain, unspecified: Secondary | ICD-10-CM | POA: Diagnosis not present

## 2013-11-28 ENCOUNTER — Ambulatory Visit: Payer: Medicare Other | Attending: Orthopedic Surgery | Admitting: Physical Therapy

## 2013-11-28 DIAGNOSIS — M810 Age-related osteoporosis without current pathological fracture: Secondary | ICD-10-CM | POA: Diagnosis not present

## 2013-11-28 DIAGNOSIS — R262 Difficulty in walking, not elsewhere classified: Secondary | ICD-10-CM | POA: Diagnosis not present

## 2013-11-28 DIAGNOSIS — M25552 Pain in left hip: Secondary | ICD-10-CM | POA: Insufficient documentation

## 2013-11-28 DIAGNOSIS — Z9889 Other specified postprocedural states: Secondary | ICD-10-CM | POA: Diagnosis not present

## 2013-11-28 DIAGNOSIS — M1990 Unspecified osteoarthritis, unspecified site: Secondary | ICD-10-CM | POA: Diagnosis not present

## 2013-12-01 ENCOUNTER — Ambulatory Visit: Payer: Medicare Other | Admitting: Physical Therapy

## 2013-12-01 DIAGNOSIS — M25552 Pain in left hip: Secondary | ICD-10-CM | POA: Diagnosis not present

## 2013-12-02 ENCOUNTER — Encounter: Payer: Self-pay | Admitting: *Deleted

## 2013-12-05 ENCOUNTER — Ambulatory Visit: Payer: Medicare Other | Admitting: Physical Therapy

## 2013-12-05 DIAGNOSIS — M25552 Pain in left hip: Secondary | ICD-10-CM | POA: Diagnosis not present

## 2013-12-08 ENCOUNTER — Ambulatory Visit: Payer: Medicare Other | Admitting: Physical Therapy

## 2013-12-08 DIAGNOSIS — M25552 Pain in left hip: Secondary | ICD-10-CM | POA: Diagnosis not present

## 2013-12-09 DIAGNOSIS — E039 Hypothyroidism, unspecified: Secondary | ICD-10-CM | POA: Insufficient documentation

## 2013-12-12 ENCOUNTER — Ambulatory Visit: Payer: Medicare Other | Admitting: Physical Therapy

## 2013-12-12 DIAGNOSIS — M25552 Pain in left hip: Secondary | ICD-10-CM | POA: Diagnosis not present

## 2013-12-15 ENCOUNTER — Ambulatory Visit: Payer: Medicare Other | Admitting: Physical Therapy

## 2013-12-15 DIAGNOSIS — M25552 Pain in left hip: Secondary | ICD-10-CM | POA: Diagnosis not present

## 2013-12-19 ENCOUNTER — Ambulatory Visit: Payer: Medicare Other | Admitting: Physical Therapy

## 2013-12-22 ENCOUNTER — Ambulatory Visit: Payer: Medicare Other | Admitting: Physical Therapy

## 2014-01-11 ENCOUNTER — Encounter (INDEPENDENT_AMBULATORY_CARE_PROVIDER_SITE_OTHER): Payer: Self-pay | Admitting: General Surgery

## 2014-01-11 NOTE — Progress Notes (Signed)
Patient ID: Brooke Jimenez, female   DOB: September 05, 1945, 68 y.o.   MRN: [id number]  Brooke Jimenez 01/11/2014 10:32 AM Location: Brooke [facility] Patient #: [id number] DOB: 1945-10-22 Married / Language: English / Race: White Female History of Present Illness Brooke Hollingshead MD; 01/11/2014 12:55 PM) Patient words: umbil hernia.  The patient is a 68 year old female    Note:She is referred by Dr. Adline Jimenez because of a symptomatic umbilical hernia. She reports that she's had the hernia ever since she was born. More recently, however, it is starting to become uncomfortable as she has been doing PT following left hip Jimenez in August of this year.Marland Kitchen She was seen by Dr. Jerrel Jimenez and referred here to discuss treatment. No symptoms of bowel obstruction. Has some occasional constipation. No difficulty with urination. No chronic cough.  Other Problems Brooke Jimenez, CMA; 01/11/2014 10:32 AM) Anxiety Disorder Arthritis Back Pain Diverticulosis Gastroesophageal Reflux Disease Heart murmur Melanoma Other disease, cancer, significant illness Thyroid Disease Umbilical Hernia Repair  Past Surgical History Brooke Jimenez, [place]; 01/11/2014 10:32 AM) Hip Jimenez Left. Oral Jimenez  Diagnostic Studies History Brooke Jimenez, [place]; 01/11/2014 10:32 AM) Colonoscopy 1-5 years ago Mammogram 1-3 years ago Pap Smear 1-5 years ago  Allergies Brooke Jimenez, [place]; 01/11/2014 10:35 AM) Codeine/Codeine Derivatives Epinephrine & Chlorpheniramine *VASOPRESSORS* Erythromycin (Ophth) *OPHTHALMIC AGENTS* Levaquin *FLUOROQUINOLONES* Penicillins Sulfa Drugs  Medication History Brooke Jimenez, CMA; 01/11/2014 10:35 AM) Levothyroxine Sodium (50MCG Tablet, Oral) Active. Minocycline HCl (50MG  Tablet, Oral) Active.  Social History Brooke Jimenez, [place]; 01/11/2014 10:32 AM) Alcohol use Occasional alcohol use. Caffeine use Coffee. No drug use Tobacco use  Never smoker.  Family History Brooke Jimenez, [place]; 01/11/2014 10:32 AM) Arthritis Mother. Breast Cancer Family Members In General, Mother. Cerebrovascular Accident Mother. Colon Cancer Mother. Heart Disease Family Members In General, Father, Mother. Hypertension Mother. Malignant Neoplasm Of Pancreas Family Members In General. Thyroid problems Mother.  Pregnancy / Birth History Brooke Jimenez, CMA; 01/11/2014 10:32 AM) Age at menarche 23 years. Age of menopause 76-50 Contraceptive History Oral contraceptives. Gravida 2 Maternal age 14-30 Para 2     Review of Systems Brooke Jimenez CMA; 01/11/2014 10:32 AM) General Present- Fatigue. Not Present- Appetite Loss, Chills, Fever, Night Sweats, Weight Gain and Weight Loss. Skin Present- Dryness. Not Present- Change in Wart/Mole, Hives, Jaundice, New Lesions, Non-Healing Wounds, Rash and Ulcer. HEENT Present- Hearing Loss and Wears glasses/contact lenses. Not Present- Earache, Hoarseness, Nose Bleed, Oral Ulcers, Ringing in the Ears, Seasonal Allergies, Sinus Pain, Sore Throat, Visual Disturbances and Yellow Eyes. Respiratory Present- Snoring. Not Present- Bloody sputum, Chronic Cough, Difficulty Breathing and Wheezing. Breast Not Present- Breast Mass, Breast Pain, Nipple Discharge and Skin Changes. Cardiovascular Present- Leg Cramps and Swelling of Extremities. Not Present- Chest Pain, Difficulty Breathing Lying Down, Palpitations, Rapid Heart Rate and Shortness of Breath. Gastrointestinal Present- Abdominal Pain. Not Present- Bloating, Bloody Stool, Change in Bowel Habits, Chronic diarrhea, Constipation, Difficulty Swallowing, Excessive gas, Gets full quickly at meals, Hemorrhoids, Indigestion, Nausea, Rectal Pain and Vomiting. Female Genitourinary Present- Frequency, Nocturia and Urgency. Not Present- Painful Urination and Pelvic Pain. Musculoskeletal Present- Joint Pain, Joint Stiffness and Muscle Pain. Not Present-  Back Pain, Muscle Weakness and Swelling of Extremities. Psychiatric Present- Anxiety. Not Present- Bipolar, Change in Sleep Pattern, Depression, Fearful and Frequent crying. Endocrine Present- Hair Changes. Not Present- Cold Intolerance, Excessive Hunger, Heat Intolerance, Hot flashes and New Diabetes. Hematology Not Present- Easy Bruising, Excessive bleeding, Gland problems, HIV and Persistent Infections.  Vitals Brooke Jimenez CMA; 01/11/2014  10:36 AM) 01/11/2014 10:36 AM Weight: 165 lb Height: 65in Body Surface Area: 1.85 m Body Mass Index: 27.46 kg/m Temp.: 98.36F  Pulse: 94 (Regular)  BP: 160/90 (Sitting, Left Arm, Standard)     Physical Exam Brooke Hollingshead MD; 01/11/2014 12:55 PM)  The physical exam findings are as follows: Note:General: Overweight female in NAD. Pleasant and cooperative.  CV: RRR, no murmur, no JVD.  CHEST: Breath sounds equal and clear. Respirations nonlabored.  ABDOMEN: Soft, nontender, nondistended, no masses, no organomegaly, active bowel sounds, small partially reducible umbilical hernia  MUSCULOSKELETAL: FROM, good muscle tone, no edema, no venous stasis changes  NEUROLOGIC: Alert and oriented, answers questions appropriately, normal gait and station.  PSYCHIATRIC: Normal mood, affect , and behavior.    Assessment & Plan Brooke Hollingshead MD; 04/18/3610 24:49 PM)  UMBILICAL HERNIA WITHOUT OBSTRUCTION AND WITHOUT GANGRENE (553.1  K42.9) Impression: The hernia is symptomatic. We discussed repair and she would like to proceed.  Plan: Umbilical hernia repair with mesh. I have discussed the procedure, risks, and aftercare. The risks include but are not limited to bleeding, infection, wound healing problems, anesthesia, recurrence, cosmetic deformity, injury to intra-abdominal organs. He/she seems to understand and would like to proceed.  Brooke Jimenez

## 2014-01-18 ENCOUNTER — Inpatient Hospital Stay (HOSPITAL_COMMUNITY): Admission: RE | Admit: 2014-01-18 | Payer: Medicare Other | Source: Ambulatory Visit

## 2014-01-23 ENCOUNTER — Ambulatory Visit (HOSPITAL_COMMUNITY): Admission: RE | Admit: 2014-01-23 | Payer: Medicare Other | Source: Ambulatory Visit | Admitting: General Surgery

## 2014-01-23 ENCOUNTER — Encounter (HOSPITAL_COMMUNITY): Admission: RE | Payer: Self-pay | Source: Ambulatory Visit

## 2014-01-23 SURGERY — REPAIR, HERNIA, UMBILICAL, ADULT
Anesthesia: General

## 2014-01-25 ENCOUNTER — Encounter: Payer: Self-pay | Admitting: Cardiology

## 2014-02-01 NOTE — Patient Instructions (Addendum)
Brooke Jimenez  02/01/2014   Your procedure is scheduled on:  02/08/2014    Come thru the [place] Entrance.    Follow the Signs to [place] at 0630       am  Call this number if you have problems the morning of surgery: [phone]   Remember:   Do not eat food or drink liquids after midnight.   Take these medicines the morning of surgery with A SIP OF WATER: Synthroid    Do not wear jewelry, make-up or nail polish.  Do not wear lotions, powders, or perfumes.  deodorant.  Do not shave 48 hours prior to surgery.   Do not bring valuables to the hospital.  Contacts, dentures or bridgework may not be worn into surgery.     Patients discharged the day of surgery will not be allowed to drive  home.  Name and phone number of your driver:       Please read over the following fact sheets that you were given: , coughing and deep breathing exercises, leg exercises            [facility] - Preparing for Surgery Before surgery, you can play an important role.  Because skin is not sterile, your skin needs to be as free of germs as possible.  You can reduce the number of germs on your skin by washing with CHG (chlorahexidine gluconate) soap before surgery.  CHG is an antiseptic cleaner which kills germs and bonds with the skin to continue killing germs even after washing. Please DO NOT use if you have an allergy to CHG or antibacterial soaps.  If your skin becomes reddened/irritated stop using the CHG and inform your nurse when you arrive at Short Stay. Do not shave (including legs and underarms) for at least 48 hours prior to the first CHG shower.  You may shave your face/neck. Please follow these instructions carefully:  1.  Shower with CHG Soap the night before surgery and the  morning of Surgery.  2.  If you choose to wash your hair, wash your hair first as usual with your  normal  shampoo.  3.  After you shampoo, rinse your hair and body thoroughly to remove the  shampoo.                            4.  Use CHG as you would any other liquid soap.  You can apply chg directly  to the skin and wash                       Gently with a scrungie or clean washcloth.  5.  Apply the CHG Soap to your body ONLY FROM THE NECK DOWN.   Do not use on face/ open                           Wound or open sores. Avoid contact with eyes, ears mouth and genitals (private parts).                       Wash face,  Genitals (private parts) with your normal soap.             6.  Wash thoroughly, paying special attention to the area where your surgery  will be performed.  7.  Thoroughly rinse your body with warm water from  the neck down.  8.  DO NOT shower/wash with your normal soap after using and rinsing off  the CHG Soap.                9.  Pat yourself dry with a clean towel.            10.  Wear clean pajamas.            11.  Place clean sheets on your bed the night of your first shower and do not  sleep with pets. Day of Surgery : Do not apply any lotions/deodorants the morning of surgery.  Please wear clean clothes to the hospital/surgery center.  FAILURE TO FOLLOW THESE INSTRUCTIONS MAY RESULT IN THE CANCELLATION OF YOUR SURGERY PATIENT SIGNATURE_________________________________  NURSE SIGNATURE__________________________________  ________________________________________________________________________

## 2014-02-02 ENCOUNTER — Encounter (HOSPITAL_COMMUNITY): Payer: Self-pay

## 2014-02-02 ENCOUNTER — Encounter (HOSPITAL_COMMUNITY)
Admission: RE | Admit: 2014-02-02 | Discharge: 2014-02-02 | Disposition: A | Discharge status: Home / Self Care | Payer: Medicare Other | Source: Ambulatory Visit | Attending: General Surgery | Admitting: General Surgery

## 2014-02-02 DIAGNOSIS — Z01812 Encounter for preprocedural laboratory examination: Secondary | ICD-10-CM | POA: Insufficient documentation

## 2014-02-02 DIAGNOSIS — K429 Umbilical hernia without obstruction or gangrene: Secondary | ICD-10-CM | POA: Insufficient documentation

## 2014-02-02 HISTORY — DX: Cardiac murmur, unspecified: R01.1

## 2014-02-02 HISTORY — DX: Family history of other specified conditions: Z84.89

## 2014-02-02 HISTORY — DX: Polyneuropathy, unspecified: G62.9

## 2014-02-02 HISTORY — DX: Cardiac arrhythmia, unspecified: I49.9

## 2014-02-02 HISTORY — DX: Nocturia: R35.1

## 2014-02-02 LAB — CBC WITH DIFFERENTIAL/PLATELET
Basophils Absolute: 0 10*3/uL (ref 0.0–0.1)
Basophils Relative: 1 % (ref 0–1)
EOS ABS: 0.2 10*3/uL (ref 0.0–0.7)
EOS PCT: 3 % (ref 0–5)
HEMATOCRIT: 40.3 % (ref 36.0–46.0)
Hemoglobin: 12.8 g/dL (ref 12.0–15.0)
LYMPHS ABS: 1.9 10*3/uL (ref 0.7–4.0)
LYMPHS PCT: 30 % (ref 12–46)
MCH: 29 pg (ref 26.0–34.0)
MCHC: 31.8 g/dL (ref 30.0–36.0)
MCV: 91.4 fL (ref 78.0–100.0)
MONO ABS: 0.7 10*3/uL (ref 0.1–1.0)
Monocytes Relative: 12 % (ref 3–12)
Neutro Abs: 3.5 10*3/uL (ref 1.7–7.7)
Neutrophils Relative %: 54 % (ref 43–77)
Platelets: 252 10*3/uL (ref 150–400)
RBC: 4.41 MIL/uL (ref 3.87–5.11)
RDW: 14.1 % (ref 11.5–15.5)
WBC: 6.3 10*3/uL (ref 4.0–10.5)

## 2014-02-02 LAB — COMPREHENSIVE METABOLIC PANEL
ALT: 18 U/L (ref 0–35)
AST: 23 U/L (ref 0–37)
Albumin: 4.1 g/dL (ref 3.5–5.2)
Alkaline Phosphatase: 84 U/L (ref 39–117)
Anion gap: 11 (ref 5–15)
BUN: 16 mg/dL (ref 6–23)
CALCIUM: 10.2 mg/dL (ref 8.4–10.5)
CO2: 25 meq/L (ref 19–32)
Chloride: 105 mEq/L (ref 96–112)
Creatinine, Ser: 0.68 mg/dL (ref 0.50–1.10)
GFR, EST NON AFRICAN AMERICAN: 88 mL/min — AB (ref >90)
GLUCOSE: 95 mg/dL (ref 70–99)
Potassium: 4.5 mEq/L (ref 3.7–5.3)
Sodium: 141 mEq/L (ref 137–147)
Total Bilirubin: 0.2 mg/dL — ABNORMAL LOW (ref 0.3–1.2)
Total Protein: 7.3 g/dL (ref 6.0–8.3)

## 2014-02-02 NOTE — Progress Notes (Signed)
CXR- 10/07/13- EPIC  EKG- 10/07/13-EPIC  EKG- 01/26/14 on chart  DrTilley- LOV- 01/26/2014  On chart

## 2014-02-06 NOTE — Progress Notes (Signed)
Patient called today and wanted to make sure that no students were involved in her surgery like she did with her last surgery in 09/2013.  I informed patient that I would place a new consent on chart and patient could sign a new consent form day of surgery and specifically state that she wanted no students involved in her care.  I also informed patient that she could call surgeon's office and let them be aware of prior to surgery.  Patient voiced understanding.  Patient also wanted to know how she stated it on her OR consent from 09/2013.  I printed OR consent from 09/2013 and placed on chart so patient could see am of surgery when she signs new consent.  Note also placed on front of chart.

## 2014-02-07 ENCOUNTER — Encounter (HOSPITAL_COMMUNITY): Payer: Self-pay | Admitting: Anesthesiology

## 2014-02-07 NOTE — Anesthesia Preprocedure Evaluation (Addendum)
Anesthesia Evaluation  Patient identified by MRN, date of birth, ID band Patient awake    Reviewed: Allergy & Precautions, NPO status , Patient's Chart, lab work & pertinent test results  History of Anesthesia Complications (+) Family history of anesthesia reaction  Airway Mallampati: II  TM Distance: >3 FB Neck ROM: Full    Dental no notable dental hx.    Pulmonary neg pulmonary ROS,  breath sounds clear to auscultation  Pulmonary exam normal       Cardiovascular Exercise Tolerance: Good + dysrhythmias + Valvular Problems/Murmurs MVP Rhythm:Regular Rate:Normal  ECG: 10-17-13: Normal. CXR: 10-17-13: No acute disease.   Neuro/Psych PSYCHIATRIC DISORDERS Anxiety PTSD Neuromuscular disease    GI/Hepatic negative GI ROS, Neg liver ROS,   Endo/Other  Hypothyroidism   Renal/GU negative Renal ROS  negative genitourinary   Musculoskeletal  (+) Arthritis -,   Abdominal   Peds negative pediatric ROS (+)  Hematology negative hematology ROS (+)   Anesthesia Other Findings   Reproductive/Obstetrics negative OB ROS                            Anesthesia Physical Anesthesia Plan  ASA: II  Anesthesia Plan: General   Post-op Pain Management:    Induction: Intravenous  Airway Management Planned:   Additional Equipment:   Intra-op Plan:   Post-operative Plan: Extubation in OR  Informed Consent: I have reviewed the patients History and Physical, chart, labs and discussed the procedure including the risks, benefits and alternatives for the proposed anesthesia with the patient or authorized representative who has indicated his/her understanding and acceptance.   Dental advisory given  Plan Discussed with: CRNA  Anesthesia Plan Comments: (ETT or LMA per Dr. Bertrum Sol needs.)        Anesthesia Quick Evaluation

## 2014-02-08 ENCOUNTER — Encounter (HOSPITAL_COMMUNITY)
Admission: RE | Disposition: A | Discharge status: Home / Self Care | Payer: Self-pay | Source: Ambulatory Visit | Attending: General Surgery

## 2014-02-08 ENCOUNTER — Ambulatory Visit (HOSPITAL_COMMUNITY)
Admission: RE | Admit: 2014-02-08 | Discharge: 2014-02-08 | Disposition: A | Discharge status: Home / Self Care | Payer: Medicare Other | Source: Ambulatory Visit | Attending: General Surgery | Admitting: General Surgery

## 2014-02-08 ENCOUNTER — Encounter (HOSPITAL_COMMUNITY): Payer: Self-pay | Admitting: *Deleted

## 2014-02-08 ENCOUNTER — Ambulatory Visit (HOSPITAL_COMMUNITY): Payer: Medicare Other | Admitting: Anesthesiology

## 2014-02-08 DIAGNOSIS — Z6827 Body mass index (BMI) 27.0-27.9, adult: Secondary | ICD-10-CM | POA: Diagnosis not present

## 2014-02-08 DIAGNOSIS — Z880 Allergy status to penicillin: Secondary | ICD-10-CM | POA: Diagnosis not present

## 2014-02-08 DIAGNOSIS — Z882 Allergy status to sulfonamides status: Secondary | ICD-10-CM | POA: Diagnosis not present

## 2014-02-08 DIAGNOSIS — E079 Disorder of thyroid, unspecified: Secondary | ICD-10-CM | POA: Insufficient documentation

## 2014-02-08 DIAGNOSIS — Z888 Allergy status to other drugs, medicaments and biological substances status: Secondary | ICD-10-CM | POA: Diagnosis not present

## 2014-02-08 DIAGNOSIS — F419 Anxiety disorder, unspecified: Secondary | ICD-10-CM | POA: Insufficient documentation

## 2014-02-08 DIAGNOSIS — E663 Overweight: Secondary | ICD-10-CM | POA: Diagnosis not present

## 2014-02-08 DIAGNOSIS — Z91048 Other nonmedicinal substance allergy status: Secondary | ICD-10-CM | POA: Insufficient documentation

## 2014-02-08 DIAGNOSIS — M1990 Unspecified osteoarthritis, unspecified site: Secondary | ICD-10-CM | POA: Diagnosis not present

## 2014-02-08 DIAGNOSIS — Z803 Family history of malignant neoplasm of breast: Secondary | ICD-10-CM | POA: Diagnosis not present

## 2014-02-08 DIAGNOSIS — Z8261 Family history of arthritis: Secondary | ICD-10-CM | POA: Diagnosis not present

## 2014-02-08 DIAGNOSIS — R011 Cardiac murmur, unspecified: Secondary | ICD-10-CM | POA: Diagnosis not present

## 2014-02-08 DIAGNOSIS — Z885 Allergy status to narcotic agent status: Secondary | ICD-10-CM | POA: Insufficient documentation

## 2014-02-08 DIAGNOSIS — K219 Gastro-esophageal reflux disease without esophagitis: Secondary | ICD-10-CM | POA: Insufficient documentation

## 2014-02-08 DIAGNOSIS — Z8249 Family history of ischemic heart disease and other diseases of the circulatory system: Secondary | ICD-10-CM | POA: Diagnosis not present

## 2014-02-08 DIAGNOSIS — K429 Umbilical hernia without obstruction or gangrene: Secondary | ICD-10-CM | POA: Insufficient documentation

## 2014-02-08 DIAGNOSIS — Z8349 Family history of other endocrine, nutritional and metabolic diseases: Secondary | ICD-10-CM | POA: Insufficient documentation

## 2014-02-08 DIAGNOSIS — C439 Malignant melanoma of skin, unspecified: Secondary | ICD-10-CM | POA: Diagnosis not present

## 2014-02-08 DIAGNOSIS — Z823 Family history of stroke: Secondary | ICD-10-CM | POA: Insufficient documentation

## 2014-02-08 DIAGNOSIS — Z800 Family history of malignant neoplasm of digestive organs: Secondary | ICD-10-CM | POA: Insufficient documentation

## 2014-02-08 DIAGNOSIS — K5790 Diverticulosis of intestine, part unspecified, without perforation or abscess without bleeding: Secondary | ICD-10-CM | POA: Diagnosis not present

## 2014-02-08 HISTORY — PX: UMBILICAL HERNIA REPAIR: SHX196

## 2014-02-08 HISTORY — PX: INSERTION OF MESH: SHX5868

## 2014-02-08 SURGERY — REPAIR, HERNIA, UMBILICAL, ADULT
Anesthesia: General | Site: Abdomen

## 2014-02-08 MED ORDER — ONDANSETRON HCL 4 MG/2ML IJ SOLN
INTRAMUSCULAR | Status: AC
  Filled 2014-02-08: qty 2

## 2014-02-08 MED ORDER — LIDOCAINE HCL (CARDIAC) 20 MG/ML IV SOLN
INTRAVENOUS | Status: DC | PRN
  Administered 2014-02-08: 80 mg via INTRAVENOUS

## 2014-02-08 MED ORDER — FENTANYL CITRATE 0.05 MG/ML IJ SOLN
INTRAMUSCULAR | Status: DC | PRN
  Administered 2014-02-08 (×4): 50 ug via INTRAVENOUS

## 2014-02-08 MED ORDER — DEXAMETHASONE SODIUM PHOSPHATE 10 MG/ML IJ SOLN
INTRAMUSCULAR | Status: AC
  Filled 2014-02-08: qty 1

## 2014-02-08 MED ORDER — SODIUM CHLORIDE 0.9 % IJ SOLN
INTRAMUSCULAR | Status: AC
  Filled 2014-02-08: qty 10

## 2014-02-08 MED ORDER — BUPIVACAINE-EPINEPHRINE (PF) 0.5% -1:200000 IJ SOLN
INTRAMUSCULAR | Status: AC
  Filled 2014-02-08: qty 30

## 2014-02-08 MED ORDER — PROMETHAZINE HCL 25 MG/ML IJ SOLN: 6.2500 mg | INTRAMUSCULAR | Status: DC | PRN

## 2014-02-08 MED ORDER — EPHEDRINE SULFATE 50 MG/ML IJ SOLN
INTRAMUSCULAR | Status: DC | PRN
  Administered 2014-02-08: 10 mg via INTRAVENOUS
  Administered 2014-02-08: 5 mg via INTRAVENOUS

## 2014-02-08 MED ORDER — ATROPINE SULFATE 0.4 MG/ML IJ SOLN
INTRAMUSCULAR | Status: AC
  Filled 2014-02-08: qty 2

## 2014-02-08 MED ORDER — EPHEDRINE SULFATE 50 MG/ML IJ SOLN
INTRAMUSCULAR | Status: AC
  Filled 2014-02-08: qty 1

## 2014-02-08 MED ORDER — LACTATED RINGERS IV SOLN
INTRAVENOUS | Status: DC | PRN
  Administered 2014-02-08: 08:00:00 via INTRAVENOUS

## 2014-02-08 MED ORDER — MIDAZOLAM HCL 2 MG/2ML IJ SOLN
INTRAMUSCULAR | Status: AC
  Filled 2014-02-08: qty 2

## 2014-02-08 MED ORDER — ACETAMINOPHEN 325 MG PO TABS: 650.0000 mg | ORAL_TABLET | ORAL | Status: DC | PRN

## 2014-02-08 MED ORDER — FENTANYL CITRATE 0.05 MG/ML IJ SOLN
INTRAMUSCULAR | Status: AC
  Filled 2014-02-08: qty 5

## 2014-02-08 MED ORDER — LIDOCAINE HCL (CARDIAC) 20 MG/ML IV SOLN
INTRAVENOUS | Status: AC
  Filled 2014-02-08: qty 5

## 2014-02-08 MED ORDER — PROPOFOL 10 MG/ML IV BOLUS
INTRAVENOUS | Status: DC | PRN
  Administered 2014-02-08: 150 mg via INTRAVENOUS

## 2014-02-08 MED ORDER — ACETAMINOPHEN 650 MG RE SUPP
650.0000 mg | RECTAL | Status: DC | PRN
  Filled 2014-02-08: qty 1

## 2014-02-08 MED ORDER — LACTATED RINGERS IV SOLN: INTRAVENOUS | Status: DC

## 2014-02-08 MED ORDER — NEOSTIGMINE METHYLSULFATE 10 MG/10ML IV SOLN
INTRAVENOUS | Status: AC
  Filled 2014-02-08: qty 1

## 2014-02-08 MED ORDER — FENTANYL CITRATE 0.05 MG/ML IJ SOLN
25.0000 ug | INTRAMUSCULAR | Status: DC | PRN
  Administered 2014-02-08 (×2): 25 ug via INTRAVENOUS

## 2014-02-08 MED ORDER — GLYCOPYRROLATE 0.2 MG/ML IJ SOLN
INTRAMUSCULAR | Status: DC | PRN
  Administered 2014-02-08: .6 mg via INTRAVENOUS

## 2014-02-08 MED ORDER — OXYCODONE HCL 5 MG PO TABS: 5.0000 mg | ORAL_TABLET | ORAL | Status: DC | PRN

## 2014-02-08 MED ORDER — ONDANSETRON HCL 4 MG PO TABS: 4.0000 mg | ORAL_TABLET | ORAL | Status: DC | PRN

## 2014-02-08 MED ORDER — BUPIVACAINE HCL 0.5 % IJ SOLN
INTRAMUSCULAR | Status: DC | PRN
  Administered 2014-02-08: 19 mL

## 2014-02-08 MED ORDER — 0.9 % SODIUM CHLORIDE (POUR BTL) OPTIME
TOPICAL | Status: DC | PRN
  Administered 2014-02-08: 1000 mL

## 2014-02-08 MED ORDER — PHENYLEPHRINE 40 MCG/ML (10ML) SYRINGE FOR IV PUSH (FOR BLOOD PRESSURE SUPPORT)
PREFILLED_SYRINGE | INTRAVENOUS | Status: AC
  Filled 2014-02-08: qty 10

## 2014-02-08 MED ORDER — FENTANYL CITRATE 0.05 MG/ML IJ SOLN
INTRAMUSCULAR | Status: AC
  Filled 2014-02-08: qty 2

## 2014-02-08 MED ORDER — DEXAMETHASONE SODIUM PHOSPHATE 10 MG/ML IJ SOLN
INTRAMUSCULAR | Status: DC | PRN
  Administered 2014-02-08: 10 mg via INTRAVENOUS

## 2014-02-08 MED ORDER — GLYCOPYRROLATE 0.2 MG/ML IJ SOLN
INTRAMUSCULAR | Status: AC
  Filled 2014-02-08: qty 3

## 2014-02-08 MED ORDER — SODIUM CHLORIDE 0.9 % IJ SOLN: 3.0000 mL | INTRAMUSCULAR | Status: DC | PRN

## 2014-02-08 MED ORDER — ACETAMINOPHEN 160 MG/5ML PO SOLN
650.0000 mg | Freq: Four times a day (QID) | ORAL | Status: DC | PRN
  Administered 2014-02-08: 650 mg via ORAL
  Filled 2014-02-08 (×2): qty 20.3

## 2014-02-08 MED ORDER — NEOSTIGMINE METHYLSULFATE 10 MG/10ML IV SOLN
INTRAVENOUS | Status: DC | PRN
  Administered 2014-02-08: 4 mg via INTRAVENOUS

## 2014-02-08 MED ORDER — PROPOFOL 10 MG/ML IV BOLUS
INTRAVENOUS | Status: AC
  Filled 2014-02-08: qty 20

## 2014-02-08 MED ORDER — VANCOMYCIN HCL IN DEXTROSE 1-5 GM/200ML-% IV SOLN
1000.0000 mg | INTRAVENOUS | Status: AC
  Administered 2014-02-08: 1000 mg via INTRAVENOUS

## 2014-02-08 MED ORDER — VANCOMYCIN HCL IN DEXTROSE 1-5 GM/200ML-% IV SOLN
INTRAVENOUS | Status: AC
  Filled 2014-02-08: qty 200

## 2014-02-08 MED ORDER — BUPIVACAINE HCL (PF) 0.5 % IJ SOLN
INTRAMUSCULAR | Status: AC
  Filled 2014-02-08: qty 30

## 2014-02-08 MED ORDER — ONDANSETRON HCL 4 MG/2ML IJ SOLN
INTRAMUSCULAR | Status: DC | PRN
Start: 2014-02-08 — End: 2014-02-08
  Administered 2014-02-08: 4 mg via INTRAVENOUS

## 2014-02-08 MED ORDER — MIDAZOLAM HCL 5 MG/5ML IJ SOLN
INTRAMUSCULAR | Status: DC | PRN
Start: 2014-02-08 — End: 2014-02-08
  Administered 2014-02-08: 2 mg via INTRAVENOUS

## 2014-02-08 MED ORDER — SUCCINYLCHOLINE CHLORIDE 20 MG/ML IJ SOLN
INTRAMUSCULAR | Status: DC | PRN
  Administered 2014-02-08: 100 mg via INTRAVENOUS

## 2014-02-08 MED ORDER — PHENYLEPHRINE HCL 10 MG/ML IJ SOLN
INTRAMUSCULAR | Status: DC | PRN
  Administered 2014-02-08 (×2): 40 ug via INTRAVENOUS

## 2014-02-08 MED ORDER — ROCURONIUM BROMIDE 100 MG/10ML IV SOLN
INTRAVENOUS | Status: DC | PRN
  Administered 2014-02-08: 30 mg via INTRAVENOUS

## 2014-02-08 SURGICAL SUPPLY — 37 items
BENZOIN TINCTURE PRP APPL 2/3 (GAUZE/BANDAGES/DRESSINGS) IMPLANT
BLADE HEX COATED 2.75 (ELECTRODE) ×3 IMPLANT
BLADE SURG 15 STRL LF DISP TIS (BLADE) ×2 IMPLANT
BLADE SURG 15 STRL SS (BLADE) ×1
CHLORAPREP W/TINT 26ML (MISCELLANEOUS) ×3 IMPLANT
DECANTER SPIKE VIAL GLASS SM (MISCELLANEOUS) IMPLANT
DERMABOND ADVANCED (GAUZE/BANDAGES/DRESSINGS) ×1
DERMABOND ADVANCED .7 DNX12 (GAUZE/BANDAGES/DRESSINGS) ×2 IMPLANT
DRAPE INCISE IOBAN 66X45 STRL (DRAPES) ×3 IMPLANT
DRAPE LAPAROTOMY T 102X78X121 (DRAPES) ×3 IMPLANT
DRSG TEGADERM 4X4.75 (GAUZE/BANDAGES/DRESSINGS) IMPLANT
ELECT REM PT RETURN 9FT ADLT (ELECTROSURGICAL) ×3
ELECTRODE REM PT RTRN 9FT ADLT (ELECTROSURGICAL) ×2 IMPLANT
GLOVE ECLIPSE 8.0 STRL XLNG CF (GLOVE) ×3 IMPLANT
GLOVE INDICATOR 8.0 STRL GRN (GLOVE) ×3 IMPLANT
GOWN STRL REUS W/TWL XL LVL3 (GOWN DISPOSABLE) ×6 IMPLANT
KIT BASIN OR (CUSTOM PROCEDURE TRAY) ×3 IMPLANT
MESH HERNIA 3X6 (Mesh General) ×3 IMPLANT
NEEDLE HYPO 25X1 1.5 SAFETY (NEEDLE) ×3 IMPLANT
PACK BASIC VI WITH GOWN DISP (CUSTOM PROCEDURE TRAY) ×3 IMPLANT
PAD TELFA 2X3 NADH STRL (GAUZE/BANDAGES/DRESSINGS) ×3 IMPLANT
PENCIL BUTTON HOLSTER BLD 10FT (ELECTRODE) ×3 IMPLANT
SOL PREP POV-IOD 4OZ 10% (MISCELLANEOUS) IMPLANT
SPONGE LAP 4X18 X RAY DECT (DISPOSABLE) ×3 IMPLANT
STRIP CLOSURE SKIN 1/2X4 (GAUZE/BANDAGES/DRESSINGS) IMPLANT
SUT MNCRL AB 4-0 PS2 18 (SUTURE) ×3 IMPLANT
SUT PROLENE 0 CT 2 (SUTURE) ×3 IMPLANT
SUT PROLENE 1 CT 1 30 (SUTURE) IMPLANT
SUT SURG 0 T 19/GS 22 1969 62 (SUTURE) ×3 IMPLANT
SUT VIC AB 3-0 SH 18 (SUTURE) ×3 IMPLANT
SUT VIC AB 3-0 SH 27 (SUTURE)
SUT VIC AB 3-0 SH 27XBRD (SUTURE) IMPLANT
SYR BULB IRRIGATION 50ML (SYRINGE) ×3 IMPLANT
SYR CONTROL 10ML LL (SYRINGE) ×6 IMPLANT
TAPE PAPER 3X10 WHT MICROPORE (GAUZE/BANDAGES/DRESSINGS) ×3 IMPLANT
TOWEL OR 17X26 10 PK STRL BLUE (TOWEL DISPOSABLE) ×3 IMPLANT
YANKAUER SUCT BULB TIP 10FT TU (MISCELLANEOUS) IMPLANT

## 2014-02-08 NOTE — Anesthesia Procedure Notes (Signed)
Procedure Name: Intubation Date/Time: 02/08/2014 8:56 AM Performed by: Carleene Cooper A Pre-anesthesia Checklist: Patient identified, Emergency Drugs available, Suction available, Patient being monitored and Timeout performed Patient Re-evaluated:Patient Re-evaluated prior to inductionOxygen Delivery Method: Circle system utilized Preoxygenation: Pre-oxygenation with 100% oxygen Intubation Type: IV induction Ventilation: Mask ventilation without difficulty Laryngoscope Size: Mac and 4 Grade View: Grade I Tube type: Oral Number of attempts: 1 Airway Equipment and Method: Stylet Placement Confirmation: ETT inserted through vocal cords under direct vision,  positive ETCO2 and breath sounds checked- equal and bilateral Secured at: 21 cm Tube secured with: Tape Dental Injury: Teeth and Oropharynx as per pre-operative assessment

## 2014-02-08 NOTE — Progress Notes (Signed)
Patient c/o dizzines and nausea instructed to hold off on PO's and close eyes and rest 20 so minutes.

## 2014-02-08 NOTE — Progress Notes (Signed)
Patient very tearful upon arrival to short stay from PACU. Emotional support given. She does not want her family to come to her room yet. She is scared of getting nauseated but denies nausea at present.

## 2014-02-08 NOTE — H&P (View-Only) (Signed)
Patient ID: Brooke Jimenez, female   DOB: [birth year], 68 y.o.   MRN: [id number]  Brooke Jimenez 01/11/2014 10:32 AM Location: [facility] Patient #: [id number] DOB: [birth year] Married / Language: English / Race: White Female History of Present Illness Brooke Hollingshead MD; 01/11/2014 12:55 PM) Patient words: umbil hernia.  The patient is a 68 year old female    Note:She is referred by Dr. Adline Mango because of a symptomatic umbilical hernia. She reports that she's had the hernia ever since she was born. More recently, however, it is starting to become uncomfortable as she has been doing PT following left hip surgery in August of this year.Marland Kitchen She was seen by Dr. Jerrel Ivory and referred here to discuss treatment. No symptoms of bowel obstruction. Has some occasional constipation. No difficulty with urination. No chronic cough.  Other Problems Briant Cedar, CMA; 01/11/2014 10:32 AM) Anxiety Disorder Arthritis Back Pain Diverticulosis Gastroesophageal Reflux Disease Heart murmur Melanoma Other disease, cancer, significant illness Thyroid Disease Umbilical Hernia Repair  Past Surgical History Briant Cedar, [place]; 01/11/2014 10:32 AM) Hip Surgery Left. Oral Surgery  Diagnostic Studies History Briant Cedar, [place]; 01/11/2014 10:32 AM) Colonoscopy 1-5 years ago Mammogram 1-3 years ago Pap Smear 1-5 years ago  Allergies Briant Cedar, [place]; 01/11/2014 10:35 AM) Codeine/Codeine Derivatives Epinephrine & Chlorpheniramine *VASOPRESSORS* Erythromycin (Ophth) *OPHTHALMIC AGENTS* Levaquin *FLUOROQUINOLONES* Penicillins Sulfa Drugs  Medication History Briant Cedar, CMA; 01/11/2014 10:35 AM) Levothyroxine Sodium (50MCG Tablet, Oral) Active. Minocycline HCl (50MG  Tablet, Oral) Active.  Social History Briant Cedar, [place]; 01/11/2014 10:32 AM) Alcohol use Occasional alcohol use. Caffeine use Coffee. No drug use Tobacco use  Never smoker.  Family History Briant Cedar, [place]; 01/11/2014 10:32 AM) Arthritis Mother. Breast Cancer Family Members In General, Mother. Cerebrovascular Accident Mother. Colon Cancer Mother. Heart Disease Family Members In General, Father, Mother. Hypertension Mother. Malignant Neoplasm Of Pancreas Family Members In General. Thyroid problems Mother.  Pregnancy / Birth History Briant Cedar, CMA; 01/11/2014 10:32 AM) Age at menarche 62 years. Age of menopause 24-50 Contraceptive History Oral contraceptives. Gravida 2 Maternal age 27-30 Para 2     Review of Systems Briant Cedar CMA; 01/11/2014 10:32 AM) General Present- Fatigue. Not Present- Appetite Loss, Chills, Fever, Night Sweats, Weight Gain and Weight Loss. Skin Present- Dryness. Not Present- Change in Wart/Mole, Hives, Jaundice, New Lesions, Non-Healing Wounds, Rash and Ulcer. HEENT Present- Hearing Loss and Wears glasses/contact lenses. Not Present- Earache, Hoarseness, Nose Bleed, Oral Ulcers, Ringing in the Ears, Seasonal Allergies, Sinus Pain, Sore Throat, Visual Disturbances and Yellow Eyes. Respiratory Present- Snoring. Not Present- Bloody sputum, Chronic Cough, Difficulty Breathing and Wheezing. Breast Not Present- Breast Mass, Breast Pain, Nipple Discharge and Skin Changes. Cardiovascular Present- Leg Cramps and Swelling of Extremities. Not Present- Chest Pain, Difficulty Breathing Lying Down, Palpitations, Rapid Heart Rate and Shortness of Breath. Gastrointestinal Present- Abdominal Pain. Not Present- Bloating, Bloody Stool, Change in Bowel Habits, Chronic diarrhea, Constipation, Difficulty Swallowing, Excessive gas, Gets full quickly at meals, Hemorrhoids, Indigestion, Nausea, Rectal Pain and Vomiting. Female Genitourinary Present- Frequency, Nocturia and Urgency. Not Present- Painful Urination and Pelvic Pain. Musculoskeletal Present- Joint Pain, Joint Stiffness and Muscle Pain. Not Present-  Back Pain, Muscle Weakness and Swelling of Extremities. Psychiatric Present- Anxiety. Not Present- Bipolar, Change in Sleep Pattern, Depression, Fearful and Frequent crying. Endocrine Present- Hair Changes. Not Present- Cold Intolerance, Excessive Hunger, Heat Intolerance, Hot flashes and New Diabetes. Hematology Not Present- Easy Bruising, Excessive bleeding, Gland problems, HIV and Persistent Infections.  Vitals Briant Cedar CMA; 01/11/2014  10:36 AM) 01/11/2014 10:36 AM Weight: 165 lb Height: 65in Body Surface Area: 1.85 m Body Mass Index: 27.46 kg/m Temp.: 98.58F  Pulse: 94 (Regular)  BP: 160/90 (Sitting, Left Arm, Standard)     Physical Exam Brooke Hollingshead MD; 01/11/2014 12:55 PM)  The physical exam findings are as follows: Note:General: Overweight female in NAD. Pleasant and cooperative.  CV: RRR, no murmur, no JVD.  CHEST: Breath sounds equal and clear. Respirations nonlabored.  ABDOMEN: Soft, nontender, nondistended, no masses, no organomegaly, active bowel sounds, small partially reducible umbilical hernia  MUSCULOSKELETAL: FROM, good muscle tone, no edema, no venous stasis changes  NEUROLOGIC: Alert and oriented, answers questions appropriately, normal gait and station.  PSYCHIATRIC: Normal mood, affect , and behavior.    Assessment & Plan Brooke Hollingshead MD; 59/29/2446 28:63 PM)  UMBILICAL HERNIA WITHOUT OBSTRUCTION AND WITHOUT GANGRENE (553.1  K42.9) Impression: The hernia is symptomatic. We discussed repair and she would like to proceed.  Plan: Umbilical hernia repair with mesh. I have discussed the procedure, risks, and aftercare. The risks include but are not limited to bleeding, infection, wound healing problems, anesthesia, recurrence, cosmetic deformity, injury to intra-abdominal organs. He/she seems to understand and would like to proceed.  Jackolyn Confer

## 2014-02-08 NOTE — Discharge Instructions (Addendum)
[facility] _______[facility]  UMBILICAL  HERNIA REPAIR: POST OP INSTRUCTIONS  Always review your discharge instruction sheet given to you by the facility where your surgery was performed. IF YOU HAVE DISABILITY OR FAMILY LEAVE FORMS, YOU MUST BRING THEM TO THE OFFICE FOR PROCESSING.   DO NOT GIVE THEM TO YOUR DOCTOR.  1. A  prescription for pain medication may be given to you upon discharge.  Take your pain medication as prescribed, if needed.  If narcotic pain medicine is not needed, then you may take acetaminophen (Tylenol) or ibuprofen (Advil) as needed. 2. Take your usually prescribed medications unless otherwise directed. 3. If you need a refill on your pain medication, please contact your pharmacy.  They will contact our office to request authorization. Prescriptions will not be filled after 5 pm or on week-ends. 4. You should follow a light diet the first 24 hours after arrival home, such as soup and crackers, etc.  Be sure to include lots of fluids daily.  Resume your normal diet the day after surgery. 5. Most patients will experience some swelling and bruising around the umbilicus.  Ice packs and reclining will help.  Swelling and bruising can take several days to resolve.  6. It is common to experience some constipation if taking pain medication after surgery.  Increasing fluid intake and taking a stool softener (such as Colace) will usually help or prevent this problem from occurring.  A mild laxative (Milk of Magnesia or Miralax) should be taken according to package directions if there are no bowel movements after 48 hours. 7. Unless discharge instructions indicate otherwise, you may remove your bandages 48 hours after surgery, and you may shower at that time.  You may have steri-strips (small skin tapes) in place directly over the incision.  These strips should be left on the skin for 7-10 days.  If your surgeon used skin glue on the incision, you may shower in 24 hours.  The  glue will flake off over the next 2-3 weeks.  Any sutures or staples will be removed at the office during your follow-up visit. 8. ACTIVITIES:  You may resume regular (light) daily activities beginning the next day--such as daily self-care, walking, climbing stairs--gradually increasing activities as tolerated.  You may have sexual intercourse when it is comfortable.  Refrain from any heavy lifting or straining-nothing over 10 pounds for 6 weeks.  a. You may drive when you are no longer taking prescription pain medication, you can comfortably wear a seatbelt, and you can safely maneuver your car and apply brakes. b. RETURN TO WORK:  __________________________________________________________ 9. You should see your doctor in the office for a follow-up appointment approximately 2-3 weeks after your surgery.  Make sure that you call for this appointment within a day or two after you arrive home to insure a convenient appointment time. 10. OTHER INSTRUCTIONS:  __________________________________________________________________________________________________________________________________________________________________________________________  WHEN TO CALL YOUR DOCTOR: 1. Fever over 101.0 2. Inability to urinate 3. Nausea and/or vomiting 4. Extreme swelling or bruising 5. Continued bleeding from incision. 6. Increased pain, redness, or drainage from the incision  The clinic staff is available to answer your questions during regular business hours.  Please dont hesitate to call and ask to speak to one of the nurses for clinical concerns.  If you have a medical emergency, go to the nearest emergency room or call 911.  A surgeon from [facility] is always on call at the hospital   [address] [employer]  [address], [place], [place]  [address] ?  [address], [place], [place]   [address] [phone] ? [phone] ? FAX [phone] Web site:  [email or web address]          General Anesthesia, Care After Refer to this sheet in the next few weeks. These instructions provide you with information on caring for yourself after your procedure. Your health care provider may also give you more specific instructions. Your treatment has been planned according to current medical practices, but problems sometimes occur. Call your health care provider if you have any problems or questions after your procedure. WHAT TO EXPECT AFTER THE PROCEDURE After the procedure, it is typical to experience:  Sleepiness.  Nausea and vomiting. HOME CARE INSTRUCTIONS  For the first 24 hours after general anesthesia:  Have a responsible person with you.  Do not drive a car. If you are alone, do not take public transportation.  Do not drink alcohol.  Do not take medicine that has not been prescribed by your health care provider.  Do not sign important papers or make important decisions.  You may resume a normal diet and activities as directed by your health care provider.  Change bandages (dressings) as directed.  If you have questions or problems that seem related to general anesthesia, call the hospital and ask for the anesthetist or anesthesiologist on call. SEEK MEDICAL CARE IF:  You have nausea and vomiting that continue the day after anesthesia.  You develop a rash. SEEK IMMEDIATE MEDICAL CARE IF:   You have difficulty breathing.  You have chest pain.  You have any allergic problems. Document Released: 05/19/2000 Document Revised: 02/15/2013 Document Reviewed: 08/26/2012 [facility] Patient Information 2015 [place], [place]. This information is not intended to replace advice given to you by your health care provider. Make sure you discuss any questions you have with your health care provider.

## 2014-02-08 NOTE — Op Note (Signed)
Preoperative diagnosis: Umbilical hernia  Postoperative diagnosis: Same  Procedure: Umbilical hernia repair with mesh.  Surgeon: Jackolyn Confer M.D.  Anesthesia: General plus Marcaine (1/2 % plain) local   Indication:  This is a 68 year old female with symptomatic umbilical hernia who presents for repair.  Technique: She was seen in the holding room and then brought to the operating room, placed supine on the operating table, and a general anesthetic was given.  The periumbilical area was sterilely prepped and draped.  Marcaine solution was infiltrated superficially and deep in the periumbilical area. A subumbilical transverse incision was made through the skin and subcutaneous tissue until the fascia was identified. The subcutaneous tissue was mobilized free from the fascia inferiorly and laterally. The umbilical stalk was mobilized and dissected free from the fascia exposing the underlying hernia defect. The hernia sac and tissue within it were reduced back into the abdominal cavity.  The subcutaneous tissue was freed from the fascia for 3-4 cm around the primary defect. The primary defect was then closed with interrupted 0 Surgilon sutures. The sutures were left long.  A piece of polypropylene mesh was brought into the field. The primary repair sutures were threaded up through the mesh and tied down anchoring the mesh directly over the primary repair. The periphery of the mesh was then anchored to the fascia with a running 0 Prolene suture to allow for 3-4 cm of mesh overlap. The excess mesh was trimmed and removed.  Hemostasis was adequate at this time. The umbilicus was reimplanted onto the mesh and fascia with 3-0 Vicryl suture. The subcutaneous tissue was closed over the mesh with a running 3-0 Vicryl suture. The skin was closed with a 4-0 Monocryl subcuticular stitch. Dermabond and a sterile dressing was applied.  She tolerated the procedure well without any apparent complications and  was taken to the recovery room in satisfactory condition.

## 2014-02-08 NOTE — Transfer of Care (Signed)
Immediate Anesthesia Transfer of Care Note  Patient: Brooke Jimenez  Procedure(s) Performed: Procedure(s): HERNIA REPAIR UMBILICAL ADULT WITH MESH (N/A) INSERTION OF MESH  Patient Location: PACU  Anesthesia Type:General  Level of Consciousness: awake, alert , oriented and patient cooperative  Airway & Oxygen Therapy: Patient Spontanous Breathing and Patient connected to face mask oxygen  Post-op Assessment: Report given to PACU RN, Post -op Vital signs reviewed and stable and Patient moving all extremities  Post vital signs: Reviewed and stable  Complications: No apparent anesthesia complications

## 2014-02-08 NOTE — Anesthesia Postprocedure Evaluation (Signed)
  Anesthesia Post-op Note  Patient: Brooke Jimenez  Procedure(s) Performed: Procedure(s) (LRB): HERNIA REPAIR UMBILICAL ADULT WITH MESH (N/A) INSERTION OF MESH  Patient Location: PACU  Anesthesia Type: General  Level of Consciousness: awake and alert   Airway and Oxygen Therapy: Patient Spontanous Breathing  Post-op Pain: mild  Post-op Assessment: Post-op Vital signs reviewed, Patient's Cardiovascular Status Stable, Respiratory Function Stable, Patent Airway and No signs of Nausea or vomiting  Last Vitals:  Filed Vitals:   02/08/14 1123  BP: 157/70  Pulse: 87  Temp: 36.5 C  Resp: 15    Post-op Vital Signs: stable   Complications: No apparent anesthesia complications

## 2014-02-08 NOTE — Interval H&P Note (Signed)
History and Physical Interval Note:  02/08/2014 8:37 AM  Brooke Jimenez  has presented today for surgery, with the diagnosis of UMBILICAL HERNIA  The various methods of treatment have been discussed with the patient and family. After consideration of risks, benefits and other options for treatment, the patient has consented to  Procedure(s): HERNIA REPAIR UMBILICAL ADULT WITH MESH (N/A) as a surgical intervention .  The patient's history has been reviewed, patient examined, no change in status, stable for surgery.  I have reviewed the patient's chart and labs.  Questions were answered to the patient's satisfaction.     Delainey Winstanley Lenna Sciara

## 2014-02-09 ENCOUNTER — Encounter (HOSPITAL_COMMUNITY): Payer: Self-pay | Admitting: General Surgery

## 2014-03-29 NOTE — Progress Notes (Signed)
Please put orders in Epic surgery 04-10-14 pre op 04-03-14 Thanks

## 2014-03-30 ENCOUNTER — Ambulatory Visit: Payer: Self-pay | Admitting: Orthopedic Surgery

## 2014-03-30 NOTE — Progress Notes (Signed)
Preoperative surgical orders have been place into the Epic hospital system for Brooke Jimenez on 03/30/2014, 1:12 PM  by Mickel Crow for surgery on 04-10-2014.  Preop Total Hip - Anterior Approach orders including Experel Injecion, IV Tylenol, and IV Decadron as long as there are no contraindications to the above medications. Arlee Muslim, PA-C

## 2014-04-03 ENCOUNTER — Encounter (HOSPITAL_COMMUNITY): Payer: Self-pay

## 2014-04-03 ENCOUNTER — Encounter (HOSPITAL_COMMUNITY)
Admission: RE | Admit: 2014-04-03 | Discharge: 2014-04-03 | Disposition: A | Discharge status: Home / Self Care | Payer: Medicare Other | Source: Ambulatory Visit | Attending: Orthopedic Surgery | Admitting: Orthopedic Surgery

## 2014-04-03 DIAGNOSIS — M87052 Idiopathic aseptic necrosis of left femur: Secondary | ICD-10-CM | POA: Diagnosis not present

## 2014-04-03 DIAGNOSIS — Z01818 Encounter for other preprocedural examination: Secondary | ICD-10-CM | POA: Diagnosis not present

## 2014-04-03 HISTORY — DX: Diverticulosis of intestine, part unspecified, without perforation or abscess without bleeding: K57.90

## 2014-04-03 HISTORY — DX: Other complications of anesthesia, initial encounter: T88.59XA

## 2014-04-03 HISTORY — DX: Age-related osteoporosis without current pathological fracture: M81.0

## 2014-04-03 HISTORY — DX: Complex regional pain syndrome I, unspecified: G90.50

## 2014-04-03 HISTORY — DX: Other cervical disc degeneration, unspecified cervical region: M50.30

## 2014-04-03 HISTORY — DX: Nevus, non-neoplastic: I78.1

## 2014-04-03 HISTORY — DX: Asymptomatic varicose veins of unspecified lower extremity: I83.90

## 2014-04-03 HISTORY — DX: Other specified postprocedural states: R11.2

## 2014-04-03 HISTORY — DX: Adverse effect of unspecified anesthetic, initial encounter: T41.45XA

## 2014-04-03 HISTORY — DX: Gout, unspecified: M10.9

## 2014-04-03 HISTORY — DX: Personal history of other infectious and parasitic diseases: Z86.19

## 2014-04-03 HISTORY — DX: Measles without complication: B05.9

## 2014-04-03 HISTORY — DX: Unspecified glaucoma: H40.9

## 2014-04-03 HISTORY — DX: Rosacea, unspecified: L71.9

## 2014-04-03 HISTORY — DX: Other specified postprocedural states: Z98.890

## 2014-04-03 HISTORY — DX: Dyskinesia of esophagus: K22.4

## 2014-04-03 HISTORY — DX: Unspecified cataract: H26.9

## 2014-04-03 HISTORY — DX: Unspecified urinary incontinence: R32

## 2014-04-03 HISTORY — DX: Lumbago with sciatica, unspecified side: M54.40

## 2014-04-03 LAB — URINALYSIS, ROUTINE W REFLEX MICROSCOPIC
Bilirubin Urine: NEGATIVE
Glucose, UA: NEGATIVE mg/dL
HGB URINE DIPSTICK: NEGATIVE
KETONES UR: NEGATIVE mg/dL
LEUKOCYTES UA: NEGATIVE
Nitrite: NEGATIVE
PH: 5.5 (ref 5.0–8.0)
Protein, ur: NEGATIVE mg/dL
Specific Gravity, Urine: 1.019 (ref 1.005–1.030)
UROBILINOGEN UA: 0.2 mg/dL (ref 0.0–1.0)

## 2014-04-03 LAB — PROTIME-INR
INR: 0.98 (ref 0.00–1.49)
Prothrombin Time: 13.1 seconds (ref 11.6–15.2)

## 2014-04-03 LAB — SURGICAL PCR SCREEN
MRSA, PCR: NEGATIVE
Staphylococcus aureus: NEGATIVE

## 2014-04-03 LAB — APTT: aPTT: 27 seconds (ref 24–37)

## 2014-04-03 NOTE — Progress Notes (Signed)
   04/03/14 1506  OBSTRUCTIVE SLEEP APNEA  Have you ever been diagnosed with sleep apnea through a sleep study? No  Do you snore loudly (loud enough to be heard through closed doors)?  1  Do you often feel tired, fatigued, or sleepy during the daytime? 1  Has anyone observed you stop breathing during your sleep? 1 ("throat closes in the morning")  Do you have, or are you being treated for high blood pressure? 0  BMI more than 35 kg/m2? 0  Age over 23 years old? 1  Neck circumference greater than 40 cm/16 inches? 0  Gender: 0  Obstructive Sleep Apnea Score 4  Score 4 or greater  Results sent to PCP

## 2014-04-03 NOTE — Progress Notes (Signed)
PTH results 03/30/14 on chart, Ionized Calcium results 03/31/14 on chart, surgery clearance note 03/28/14 Dr. [name] on chart, LOV note 03/27/14 Dr. [name] on chart, CBC with diff 03/27/14 results on chart, CMET results 03/27/14 on chart, EKG 21/1/15 on chart, EKG 10/07/13 on EPIC, Chest x-ray 10/07/13 on EPIC

## 2014-04-03 NOTE — Patient Instructions (Signed)
Kamee Bobst  04/03/2014   Your procedure is scheduled on: Monday 04/10/14  Report to [facility]  Entrance and follow signs to               [place] at 1:00 PM.  Call this number if you have problems the morning of surgery [phone]   Remember:  Do not eat food After Midnight. Clear liquids from midnight until 10:00am on 04/10/14 then nothing.      Take these medicines the morning of surgery with A SIP OF WATER: levothyroxine                                You may not have any metal on your body including hair pins and              piercings  Do not wear jewelry, make-up, lotions, powders or perfumes.             Do not wear nail polish.  Do not shave  48 hours prior to surgery.              Men may shave face and neck.  Do not bring valuables to the hospital. [place].  Contacts, dentures or bridgework may not be worn into surgery.  Leave suitcase in the car. After surgery it may be brought to your room.               Please read over the following fact sheets you were given: MRSA information  _____________________________________________________________________             [facility] - Preparing for Surgery Before surgery, you can play an important role.  Because skin is not sterile, your skin needs to be as free of germs as possible.  You can reduce the number of germs on your skin by washing with CHG (chlorahexidine gluconate) soap before surgery.  CHG is an antiseptic cleaner which kills germs and bonds with the skin to continue killing germs even after washing. Please DO NOT use if you have an allergy to CHG or antibacterial soaps.  If your skin becomes reddened/irritated stop using the CHG and inform your nurse when you arrive at Short Stay. Do not shave (including legs and underarms) for at least 48 hours prior to the first CHG shower.  You may shave your face/neck. Please follow  these instructions carefully:  1.  Shower with CHG Soap the night before surgery and the  morning of Surgery.  2.  If you choose to wash your hair, wash your hair first as usual with your  normal  shampoo.  3.  After you shampoo, rinse your hair and body thoroughly to remove the  shampoo.                            4.  Use CHG as you would any other liquid soap.  You can apply chg directly  to the skin and wash                       Gently with a scrungie or clean washcloth.  5.  Apply the CHG Soap to your body ONLY  FROM THE NECK DOWN.   Do not use on face/ open                           Wound or open sores. Avoid contact with eyes, ears mouth and genitals (private parts).                       Wash face,  Genitals (private parts) with your normal soap.             6.  Wash thoroughly, paying special attention to the area where your surgery  will be performed.  7.  Thoroughly rinse your body with warm water from the neck down.  8.  DO NOT shower/wash with your normal soap after using and rinsing off  the CHG Soap.                9.  Pat yourself dry with a clean towel.            10.  Wear clean pajamas.            11.  Place clean sheets on your bed the night of your first shower and do not  sleep with pets. Day of Surgery : Do not apply any lotions/deodorants the morning of surgery.  Please wear clean clothes to the hospital/surgery center.  FAILURE TO FOLLOW THESE INSTRUCTIONS MAY RESULT IN THE CANCELLATION OF YOUR SURGERY PATIENT SIGNATURE_________________________________  NURSE SIGNATURE__________________________________  ________________________________________________________________________  WHAT IS A BLOOD TRANSFUSION? Blood Transfusion Information  A transfusion is the replacement of blood or some of its parts. Blood is made up of multiple cells which provide different functions.  Red blood cells carry oxygen and are used for blood loss replacement.  White blood cells fight  against infection.  Platelets control bleeding.  Plasma helps clot blood.  Other blood products are available for specialized needs, such as hemophilia or other clotting disorders. BEFORE THE TRANSFUSION  Who gives blood for transfusions?   Healthy volunteers who are fully evaluated to make sure their blood is safe. This is blood bank blood. Transfusion therapy is the safest it has ever been in the practice of medicine. Before blood is taken from a donor, a complete history is taken to make sure that person has no history of diseases nor engages in risky social behavior (examples are intravenous drug use or sexual activity with multiple partners). The donor's travel history is screened to minimize risk of transmitting infections, such as malaria. The donated blood is tested for signs of infectious diseases, such as HIV and hepatitis. The blood is then tested to be sure it is compatible with you in order to minimize the chance of a transfusion reaction. If you or a relative donates blood, this is often done in anticipation of surgery and is not appropriate for emergency situations. It takes many days to process the donated blood. RISKS AND COMPLICATIONS Although transfusion therapy is very safe and saves many lives, the main dangers of transfusion include:  1. Getting an infectious disease. 2. Developing a transfusion reaction. This is an allergic reaction to something in the blood you were given. Every precaution is taken to prevent this. The decision to have a blood transfusion has been considered carefully by your caregiver before blood is given. Blood is not given unless the benefits outweigh the risks. AFTER THE TRANSFUSION  Right after receiving a blood transfusion, you will usually feel much better  and more energetic. This is especially true if your red blood cells have gotten low (anemic). The transfusion raises the level of the red blood cells which carry oxygen, and this usually causes an  energy increase.  The nurse administering the transfusion will monitor you carefully for complications. HOME CARE INSTRUCTIONS  No special instructions are needed after a transfusion. You may find your energy is better. Speak with your caregiver about any limitations on activity for underlying diseases you may have. SEEK MEDICAL CARE IF:   Your condition is not improving after your transfusion.  You develop redness or irritation at the intravenous (IV) site. SEEK IMMEDIATE MEDICAL CARE IF:  Any of the following symptoms occur over the next 12 hours:  Shaking chills.  You have a temperature by mouth above 102 F (38.9 C), not controlled by medicine.  Chest, back, or muscle pain.  People around you feel you are not acting correctly or are confused.  Shortness of breath or difficulty breathing.  Dizziness and fainting.  You get a rash or develop hives.  You have a decrease in urine output.  Your urine turns a dark color or changes to pink, red, or brown. Any of the following symptoms occur over the next 10 days:  You have a temperature by mouth above 102 F (38.9 C), not controlled by medicine.  Shortness of breath.  Weakness after normal activity.  The white part of the eye turns yellow (jaundice).  You have a decrease in the amount of urine or are urinating less often.  Your urine turns a dark color or changes to pink, red, or brown. Document Released: 02/08/2000 Document Revised: 05/05/2011 Document Reviewed: 09/27/2007 ExitCare Patient Information 2014 [place].  _______________________________________________________________________  Incentive Spirometer  An incentive spirometer is a tool that can help keep your lungs clear and active. This tool measures how well you are filling your lungs with each breath. Taking long deep breaths may help reverse or decrease the chance of developing breathing (pulmonary) problems (especially infection) following:  A  long period of time when you are unable to move or be active. BEFORE THE PROCEDURE   If the spirometer includes an indicator to show your best effort, your nurse or respiratory therapist will set it to a desired goal.  If possible, sit up straight or lean slightly forward. Try not to slouch.  Hold the incentive spirometer in an upright position. INSTRUCTIONS FOR USE  3. Sit on the edge of your bed if possible, or sit up as far as you can in bed or on a chair. 4. Hold the incentive spirometer in an upright position. 5. Breathe out normally. 6. Place the mouthpiece in your mouth and seal your lips tightly around it. 7. Breathe in slowly and as deeply as possible, raising the piston or the ball toward the top of the column. 8. Hold your breath for 3-5 seconds or for as long as possible. Allow the piston or ball to fall to the bottom of the column. 9. Remove the mouthpiece from your mouth and breathe out normally. 10. Rest for a few seconds and repeat Steps 1 through 7 at least 10 times every 1-2 hours when you are awake. Take your time and take a few normal breaths between deep breaths. 11. The spirometer may include an indicator to show your best effort. Use the indicator as a goal to work toward during each repetition. 12. After each set of 10 deep breaths, practice coughing to be sure your  lungs are clear. If you have an incision (the cut made at the time of surgery), support your incision when coughing by placing a pillow or rolled up towels firmly against it. Once you are able to get out of bed, walk around indoors and cough well. You may stop using the incentive spirometer when instructed by your caregiver.  RISKS AND COMPLICATIONS  Take your time so you do not get dizzy or light-headed.  If you are in pain, you may need to take or ask for pain medication before doing incentive spirometry. It is harder to take a deep breath if you are having pain. AFTER USE  Rest and breathe slowly and  easily.  It can be helpful to keep track of a log of your progress. Your caregiver can provide you with a simple table to help with this. If you are using the spirometer at home, follow these instructions: [place] IF:   You are having difficultly using the spirometer.  You have trouble using the spirometer as often as instructed.  Your pain medication is not giving enough relief while using the spirometer.  You develop fever of 100.5 F (38.1 C) or higher. SEEK IMMEDIATE MEDICAL CARE IF:   You cough up bloody sputum that had not been present before.  You develop fever of 102 F (38.9 C) or greater.  You develop worsening pain at or near the incision site. MAKE SURE YOU:   Understand these instructions.  Will watch your condition.  Will get help right away if you are not doing well or get worse. Document Released: 06/23/2006 Document Revised: 05/05/2011 Document Reviewed: 08/24/2006 ExitCare Patient Information 2014 [place].   ________________________________________________________________________    CLEAR LIQUID DIET   Foods Allowed                                                                     Foods Excluded  Coffee and tea, regular and decaf                             liquids that you cannot  Plain Jell-O in any flavor                                             see through such as: Fruit ices (not with fruit pulp)                                     milk, soups, orange juice  Iced Popsicles                                    All solid food Carbonated beverages, regular and diet                                    Cranberry, grape and apple juices Sports drinks like Gatorade Lightly  seasoned clear broth or consume(fat free) Sugar, honey syrup  Sample Menu Breakfast                                Lunch                                     Supper Cranberry juice                    Beef broth                            Chicken broth Jell-O                                      Grape juice                           Apple juice Coffee or tea                        Jell-O                                      Popsicle                                                Coffee or tea                        Coffee or tea  _____________________________________________________________________

## 2014-04-04 LAB — ABO/RH: ABO/RH(D): O POS

## 2014-04-08 NOTE — Anesthesia Preprocedure Evaluation (Addendum)
Anesthesia Evaluation  Patient identified by MRN, date of birth, ID band Patient awake    Reviewed: Allergy & Precautions, NPO status , Patient's Chart, lab work & pertinent test results  History of Anesthesia Complications (+) PONV and history of anesthetic complications  Airway Mallampati: III  TM Distance: >3 FB Neck ROM: Full    Dental no notable dental hx. (+) Dental Advisory Given, Poor Dentition   Pulmonary neg pulmonary ROS,  breath sounds clear to auscultation  Pulmonary exam normal       Cardiovascular + Peripheral Vascular Disease negative cardio ROS  + dysrhythmias + Valvular Problems/Murmurs MVP Rhythm:Regular Rate:Normal     Neuro/Psych PSYCHIATRIC DISORDERS Anxiety Depression    GI/Hepatic Neg liver ROS, hiatal hernia, GERD-  Controlled and Medicated,  Endo/Other  Hypothyroidism   Renal/GU negative Renal ROS  negative genitourinary   Musculoskeletal  (+) Arthritis -, Osteoarthritis,    Abdominal   Peds negative pediatric ROS (+)  Hematology negative hematology ROS (+)   Anesthesia Other Findings   Reproductive/Obstetrics negative OB ROS                            Anesthesia Physical Anesthesia Plan  ASA: II  Anesthesia Plan: Spinal   Post-op Pain Management:    Induction:   Airway Management Planned:   Additional Equipment:   Intra-op Plan:   Post-operative Plan: Extubation in OR  Informed Consent: I have reviewed the patients History and Physical, chart, labs and discussed the procedure including the risks, benefits and alternatives for the proposed anesthesia with the patient or authorized representative who has indicated his/her understanding and acceptance.   Dental advisory given  Plan Discussed with: CRNA  Anesthesia Plan Comments:        Anesthesia Quick Evaluation

## 2014-04-09 ENCOUNTER — Ambulatory Visit: Payer: Self-pay | Admitting: Orthopedic Surgery

## 2014-04-09 NOTE — H&P (Signed)
Brooke Jimenez. Bergen DOB: [birth year] Married / Language: English / Race: White Female Date of Admission:  04/10/2014 CC:  Left Hip Pain History of Present Illness The patient is a 69 year old female who comes in for a preoperative History and Physical. The patient is scheduled for a left total hip arthroplasty (anterior approach) to be performed by Dr. Dione Plover. Aluisio, MD at [facility] on 04-10-2014. The patient is being followed for their left hip pain and osteoarthritis. They are several months out from hardware removal of the left hip) out. Symptoms reported today include: pain. The patient feels that they are doing poorly and report their pain level to be moderate. Unfortunately, her pain is persisting. When she rests, she tends to do pretty well, but she is definitely severely limited in what she can and cannot do. She is not doing things she desires because of the pain associated with that. She is at a stage now where she is willing to entertain any options to get her feeling better. She is ready to proceed with left hip replacement at this time. They have been treated conservatively in the past for the above stated problem and despite conservative measures, they continue to have progressive pain and severe functional limitations and dysfunction. They have failed non-operative management including home exercise, medications, and also hardware removal. It is felt that they would benefit from undergoing total joint replacement. Risks and benefits of the procedure have been discussed with the patient and they elect to proceed with surgery. There are no active contraindications to surgery such as ongoing infection or rapidly progressive neurological disease.   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 Erythromycin (Ophth) *OPHTHALMIC AGENTS* Nausea. Levaquin *Fluoroquinolones** Nausea. Sulfa Drugs Rash. Codeine Phosphate *ANALGESICS - OPIOID* Nausea. EPINEPHrine *Vasopressors** Rapid heart rate  Family History Cancer Mother. mother Kidney disease mother Severe allergy mother Cerebrovascular Accident mother and grandmother mothers side Congestive Heart Failure mother and grandmother mothers side Heart Disease mother, father, grandmother mothers side and grandfather fathers side Osteoarthritis mother Hypertension mother and grandmother mothers side  Social History Tobacco / smoke exposure no Previously in rehab no Pain Contract no Exercise Exercises weekly; does individual sport Alcohol use former drinker Marital status married Tobacco use Never smoker. never smoker Number of flights of stairs before winded 1 Current work status retired Children 2 Drug/Alcohol Rehab (Currently) no Illicit drug use no Living situation live with spouse  Medication History Ibuprofen (200MG  Tablet, Oral as needed) Active. Levothyroxine Sodium (50MCG Tablet, Oral) Active. Minocycline HCl (50MG  Tablet, Oral) Active. Vitamin B Complex (Oral as needed) Active. Magnesium Aspartate (Oral as needed) Specific dose unknown -  Active. Calcium Carbonate (Oral as needed) Specific dose unknown - Active. Multivitamin (Oral as needed) Active. Tylenol (1 Oral as needed) Specific dose unknown - Active.  Past Surgical History  Hip Fracture and Surgery left  Review of Systems General Not Present- Chills, Fatigue, Fever, Memory Loss,  Night Sweats, Weight Gain and Weight Loss. Skin Not Present- Eczema, Hives, Itching, Lesions and Rash. HEENT Present- Hearing Loss. Not Present- Dentures, Double Vision, Headache, Tinnitus and Visual Loss. Respiratory Not Present- Allergies, Chronic Cough, Coughing up blood, Shortness of breath at rest and Shortness of breath with exertion. Cardiovascular Present- Palpitations. Not Present- Chest Pain, Difficulty Breathing Lying Down, Murmur, Racing/skipping heartbeats and Swelling. Gastrointestinal Present- Heartburn. Not Present- Abdominal Pain, Bloody Stool, Constipation, Diarrhea, Difficulty Swallowing, Jaundice, Loss of appetitie, Nausea and Vomiting. Female Genitourinary Present- Incontinence, Urinary frequency and Urinating at Night. Not Present- Blood in Urine, Discharge, Flank Pain, Painful Urination, Urgency, Urinary Retention and Weak urinary stream. Musculoskeletal Present- Back Pain (lower), Joint Pain and Morning Stiffness. Not Present- Joint Swelling, Muscle Pain, Muscle Weakness and Spasms. Neurological Not Present- Blackout spells, Difficulty with balance, Dizziness, Paralysis, Tremor and Weakness. Psychiatric Not Present- Insomnia.  Vitals  Weight: 163 lb Height: 65in Weight was reported by patient. Height was reported by patient. Body Surface Area: 1.81 m Body Mass Index: 27.12 kg/m  BP: 144/82 (Sitting, Left Arm, Standard)  Physical Exam General Mental Status -Alert, cooperative and good historian. General Appearance-pleasant, Not in acute distress. Orientation-Oriented X3. Build & Nutrition-Well nourished and Well developed.  Head and  Neck Head-normocephalic, atraumatic . Neck Global Assessment - supple, no bruit auscultated on the right, no bruit auscultated on the left.  Eye Pupil - Bilateral-Regular and Round. Motion - Bilateral-EOMI.  Chest and Lung Exam Auscultation Breath sounds - clear at anterior chest wall and clear at posterior chest wall. Adventitious sounds - No Adventitious sounds.  Cardiovascular Auscultation Rhythm - Regular rate and rhythm. Heart Sounds - S1 WNL and S2 WNL. Murmurs & Other Heart Sounds - Auscultation of the heart reveals - No Murmurs.  Abdomen Palpation/Percussion Tenderness - Abdomen is non-tender to palpation. Rigidity (guarding) - Abdomen is soft. Auscultation Auscultation of the abdomen reveals - Bowel sounds normal.  Female Genitourinary Note: Not done, not pertinent to present illness   Musculoskeletal Note: On examination, she is alert and oriented in no apparent distress. She still walks with a significantly antalgic gait with an abductor lurch on the left. She has limited range of motion of that left hip compared to the right. She has had some slight trochanteric tenderness.  We reviewed her MRI scan. She has a large avascular area in the femoral head which does not appear acute as there is no surrounding edema. She does have a large cyst in the superior acetabulum as well as some bone-on-bone change superolateral.   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

## 2014-04-10 ENCOUNTER — Inpatient Hospital Stay (HOSPITAL_COMMUNITY): Payer: Medicare Other | Admitting: Anesthesiology

## 2014-04-10 ENCOUNTER — Inpatient Hospital Stay (HOSPITAL_COMMUNITY): Payer: Medicare Other

## 2014-04-10 ENCOUNTER — Encounter (HOSPITAL_COMMUNITY)
Admission: RE | Disposition: A | Discharge status: Home / Self Care | Payer: Self-pay | Source: Ambulatory Visit | Attending: Orthopedic Surgery

## 2014-04-10 ENCOUNTER — Encounter (HOSPITAL_COMMUNITY): Payer: Self-pay

## 2014-04-10 ENCOUNTER — Inpatient Hospital Stay (HOSPITAL_COMMUNITY)
Admission: RE | Admit: 2014-04-10 | Discharge: 2014-04-12 | DRG: 470 | Disposition: A | Discharge status: Home / Self Care | Payer: Medicare Other | Source: Ambulatory Visit | Attending: Orthopedic Surgery | Admitting: Orthopedic Surgery

## 2014-04-10 DIAGNOSIS — M169 Osteoarthritis of hip, unspecified: Secondary | ICD-10-CM | POA: Diagnosis present

## 2014-04-10 DIAGNOSIS — H9193 Unspecified hearing loss, bilateral: Secondary | ICD-10-CM | POA: Diagnosis present

## 2014-04-10 DIAGNOSIS — F4310 Post-traumatic stress disorder, unspecified: Secondary | ICD-10-CM | POA: Diagnosis present

## 2014-04-10 DIAGNOSIS — Z882 Allergy status to sulfonamides status: Secondary | ICD-10-CM | POA: Diagnosis not present

## 2014-04-10 DIAGNOSIS — H409 Unspecified glaucoma: Secondary | ICD-10-CM | POA: Diagnosis present

## 2014-04-10 DIAGNOSIS — I739 Peripheral vascular disease, unspecified: Secondary | ICD-10-CM | POA: Diagnosis not present

## 2014-04-10 DIAGNOSIS — M25552 Pain in left hip: Secondary | ICD-10-CM | POA: Diagnosis present

## 2014-04-10 DIAGNOSIS — H547 Unspecified visual loss: Secondary | ICD-10-CM | POA: Diagnosis present

## 2014-04-10 DIAGNOSIS — M87852 Other osteonecrosis, left femur: Secondary | ICD-10-CM | POA: Diagnosis not present

## 2014-04-10 DIAGNOSIS — Z888 Allergy status to other drugs, medicaments and biological substances status: Secondary | ICD-10-CM | POA: Diagnosis not present

## 2014-04-10 DIAGNOSIS — M87052 Idiopathic aseptic necrosis of left femur: Secondary | ICD-10-CM | POA: Diagnosis present

## 2014-04-10 DIAGNOSIS — Z96649 Presence of unspecified artificial hip joint: Secondary | ICD-10-CM

## 2014-04-10 DIAGNOSIS — F419 Anxiety disorder, unspecified: Secondary | ICD-10-CM | POA: Diagnosis present

## 2014-04-10 DIAGNOSIS — Z419 Encounter for procedure for purposes other than remedying health state, unspecified: Secondary | ICD-10-CM

## 2014-04-10 DIAGNOSIS — M545 Low back pain: Secondary | ICD-10-CM | POA: Diagnosis present

## 2014-04-10 DIAGNOSIS — H269 Unspecified cataract: Secondary | ICD-10-CM | POA: Diagnosis present

## 2014-04-10 DIAGNOSIS — Z880 Allergy status to penicillin: Secondary | ICD-10-CM | POA: Diagnosis not present

## 2014-04-10 DIAGNOSIS — Z85820 Personal history of malignant melanoma of skin: Secondary | ICD-10-CM

## 2014-04-10 DIAGNOSIS — E039 Hypothyroidism, unspecified: Secondary | ICD-10-CM | POA: Diagnosis present

## 2014-04-10 DIAGNOSIS — G629 Polyneuropathy, unspecified: Secondary | ICD-10-CM | POA: Diagnosis present

## 2014-04-10 DIAGNOSIS — Z886 Allergy status to analgesic agent status: Secondary | ICD-10-CM

## 2014-04-10 DIAGNOSIS — Z881 Allergy status to other antibiotic agents status: Secondary | ICD-10-CM | POA: Diagnosis not present

## 2014-04-10 DIAGNOSIS — M1612 Unilateral primary osteoarthritis, left hip: Secondary | ICD-10-CM | POA: Diagnosis present

## 2014-04-10 DIAGNOSIS — K219 Gastro-esophageal reflux disease without esophagitis: Secondary | ICD-10-CM | POA: Diagnosis present

## 2014-04-10 DIAGNOSIS — M109 Gout, unspecified: Secondary | ICD-10-CM | POA: Diagnosis present

## 2014-04-10 HISTORY — PX: TOTAL HIP ARTHROPLASTY: SHX124

## 2014-04-10 LAB — TYPE AND SCREEN
ABO/RH(D): O POS
Antibody Screen: NEGATIVE

## 2014-04-10 SURGERY — ARTHROPLASTY, HIP, TOTAL, ANTERIOR APPROACH
Anesthesia: Spinal | Site: Hip | Laterality: Left

## 2014-04-10 MED ORDER — DIPHENHYDRAMINE HCL 12.5 MG/5ML PO ELIX: 12.5000 mg | ORAL_SOLUTION | ORAL | Status: DC | PRN

## 2014-04-10 MED ORDER — LACTATED RINGERS IV SOLN
INTRAVENOUS | Status: DC
  Administered 2014-04-10: 1000 mL via INTRAVENOUS
  Administered 2014-04-10: 16:00:00 via INTRAVENOUS

## 2014-04-10 MED ORDER — VANCOMYCIN HCL IN DEXTROSE 1-5 GM/200ML-% IV SOLN
1000.0000 mg | INTRAVENOUS | Status: AC
  Administered 2014-04-10: 1000 mg via INTRAVENOUS

## 2014-04-10 MED ORDER — ACETAMINOPHEN 650 MG RE SUPP: 650.0000 mg | Freq: Four times a day (QID) | RECTAL | Status: DC | PRN

## 2014-04-10 MED ORDER — HYDROMORPHONE HCL 2 MG PO TABS
2.0000 mg | ORAL_TABLET | ORAL | Status: DC | PRN
  Administered 2014-04-12 (×4): 2 mg via ORAL
  Filled 2014-04-10 (×4): qty 1

## 2014-04-10 MED ORDER — DEXAMETHASONE SODIUM PHOSPHATE 10 MG/ML IJ SOLN
INTRAMUSCULAR | Status: DC | PRN
  Administered 2014-04-10: 10 mg via INTRAVENOUS

## 2014-04-10 MED ORDER — TRAMADOL HCL 50 MG PO TABS
50.0000 mg | ORAL_TABLET | Freq: Four times a day (QID) | ORAL | Status: DC | PRN
  Administered 2014-04-11: 50 mg via ORAL
  Filled 2014-04-10: qty 2

## 2014-04-10 MED ORDER — METOCLOPRAMIDE HCL 5 MG/ML IJ SOLN
INTRAMUSCULAR | Status: DC | PRN
  Administered 2014-04-10: 10 mg via INTRAVENOUS

## 2014-04-10 MED ORDER — PROPOFOL INFUSION 10 MG/ML OPTIME
INTRAVENOUS | Status: DC | PRN
  Administered 2014-04-10: 180 ug/kg/min via INTRAVENOUS

## 2014-04-10 MED ORDER — VANCOMYCIN HCL IN DEXTROSE 1-5 GM/200ML-% IV SOLN
1000.0000 mg | Freq: Two times a day (BID) | INTRAVENOUS | Status: AC
  Administered 2014-04-11: 1000 mg via INTRAVENOUS
  Filled 2014-04-10: qty 200

## 2014-04-10 MED ORDER — DEXAMETHASONE SODIUM PHOSPHATE 10 MG/ML IJ SOLN
10.0000 mg | Freq: Once | INTRAMUSCULAR | Status: AC
  Administered 2014-04-11: 10 mg via INTRAVENOUS
  Filled 2014-04-10: qty 1

## 2014-04-10 MED ORDER — ONDANSETRON HCL 4 MG/2ML IJ SOLN: 4.0000 mg | Freq: Once | INTRAMUSCULAR | Status: DC | PRN

## 2014-04-10 MED ORDER — BUPIVACAINE HCL (PF) 0.5 % IJ SOLN
INTRAMUSCULAR | Status: AC
  Filled 2014-04-10: qty 30

## 2014-04-10 MED ORDER — PROPOFOL 10 MG/ML IV BOLUS
INTRAVENOUS | Status: AC
  Filled 2014-04-10: qty 20

## 2014-04-10 MED ORDER — PHENYLEPHRINE HCL 10 MG/ML IJ SOLN
INTRAMUSCULAR | Status: DC | PRN
  Administered 2014-04-10: 80 ug via INTRAVENOUS
  Administered 2014-04-10: 120 ug via INTRAVENOUS

## 2014-04-10 MED ORDER — DEXTROSE-NACL 5-0.9 % IV SOLN: INTRAVENOUS | Status: DC

## 2014-04-10 MED ORDER — MIDAZOLAM HCL 2 MG/2ML IJ SOLN
INTRAMUSCULAR | Status: AC
  Filled 2014-04-10: qty 2

## 2014-04-10 MED ORDER — BUPIVACAINE HCL (PF) 0.25 % IJ SOLN
INTRAMUSCULAR | Status: AC
  Filled 2014-04-10: qty 30

## 2014-04-10 MED ORDER — DEXAMETHASONE SODIUM PHOSPHATE 10 MG/ML IJ SOLN
INTRAMUSCULAR | Status: AC
  Filled 2014-04-10: qty 1

## 2014-04-10 MED ORDER — BUPIVACAINE LIPOSOME 1.3 % IJ SUSP
20.0000 mL | Freq: Once | INTRAMUSCULAR | Status: DC
  Filled 2014-04-10: qty 20

## 2014-04-10 MED ORDER — POLYETHYLENE GLYCOL 3350 17 G PO PACK
17.0000 g | PACK | Freq: Every day | ORAL | Status: DC | PRN
  Administered 2014-04-11: 17 g via ORAL
  Filled 2014-04-10: qty 1

## 2014-04-10 MED ORDER — RIVAROXABAN 10 MG PO TABS
10.0000 mg | ORAL_TABLET | Freq: Every day | ORAL | Status: DC
Start: 2014-04-11 — End: 2014-04-12
  Administered 2014-04-11 – 2014-04-12 (×2): 10 mg via ORAL
  Filled 2014-04-10 (×3): qty 1

## 2014-04-10 MED ORDER — MIDAZOLAM HCL 5 MG/5ML IJ SOLN
INTRAMUSCULAR | Status: DC | PRN
  Administered 2014-04-10: 2 mg via INTRAVENOUS

## 2014-04-10 MED ORDER — PHENYLEPHRINE 40 MCG/ML (10ML) SYRINGE FOR IV PUSH (FOR BLOOD PRESSURE SUPPORT)
PREFILLED_SYRINGE | INTRAVENOUS | Status: AC
  Filled 2014-04-10: qty 10

## 2014-04-10 MED ORDER — MENTHOL 3 MG MT LOZG
1.0000 | LOZENGE | OROMUCOSAL | Status: DC | PRN
  Filled 2014-04-10: qty 9

## 2014-04-10 MED ORDER — ACETAMINOPHEN 10 MG/ML IV SOLN
1000.0000 mg | Freq: Once | INTRAVENOUS | Status: AC
  Administered 2014-04-10: 1000 mg via INTRAVENOUS
  Filled 2014-04-10: qty 100

## 2014-04-10 MED ORDER — ACETAMINOPHEN 325 MG PO TABS
650.0000 mg | ORAL_TABLET | Freq: Four times a day (QID) | ORAL | Status: DC | PRN
  Administered 2014-04-11: 650 mg via ORAL
  Filled 2014-04-10: qty 2

## 2014-04-10 MED ORDER — FENTANYL CITRATE 0.05 MG/ML IJ SOLN
INTRAMUSCULAR | Status: DC | PRN
  Administered 2014-04-10: 50 ug via INTRAVENOUS

## 2014-04-10 MED ORDER — ACETAMINOPHEN 500 MG PO TABS
1000.0000 mg | ORAL_TABLET | Freq: Four times a day (QID) | ORAL | Status: AC
  Administered 2014-04-10 – 2014-04-11 (×4): 1000 mg via ORAL
  Filled 2014-04-10 (×4): qty 2

## 2014-04-10 MED ORDER — FENTANYL CITRATE 0.05 MG/ML IJ SOLN: 25.0000 ug | INTRAMUSCULAR | Status: DC | PRN

## 2014-04-10 MED ORDER — PROPOFOL 10 MG/ML IV BOLUS
INTRAVENOUS | Status: DC | PRN
  Administered 2014-04-10: 30 mg via INTRAVENOUS

## 2014-04-10 MED ORDER — PHENYLEPHRINE HCL 10 MG/ML IJ SOLN
INTRAMUSCULAR | Status: AC
  Filled 2014-04-10: qty 1

## 2014-04-10 MED ORDER — DOCUSATE SODIUM 100 MG PO CAPS
100.0000 mg | ORAL_CAPSULE | Freq: Two times a day (BID) | ORAL | Status: DC
  Administered 2014-04-10 – 2014-04-12 (×4): 100 mg via ORAL

## 2014-04-10 MED ORDER — BISACODYL 10 MG RE SUPP: 10.0000 mg | Freq: Every day | RECTAL | Status: DC | PRN

## 2014-04-10 MED ORDER — METOCLOPRAMIDE HCL 5 MG/ML IJ SOLN: 5.0000 mg | Freq: Three times a day (TID) | INTRAMUSCULAR | Status: DC | PRN

## 2014-04-10 MED ORDER — ONDANSETRON HCL 4 MG/2ML IJ SOLN
INTRAMUSCULAR | Status: AC
  Filled 2014-04-10: qty 2

## 2014-04-10 MED ORDER — PHENYLEPHRINE HCL 10 MG/ML IJ SOLN
10.0000 mg | INTRAVENOUS | Status: DC | PRN
  Administered 2014-04-10: 25 ug/min via INTRAVENOUS

## 2014-04-10 MED ORDER — PHENOL 1.4 % MT LIQD
1.0000 | OROMUCOSAL | Status: DC | PRN
  Filled 2014-04-10: qty 177

## 2014-04-10 MED ORDER — HYDROMORPHONE HCL 1 MG/ML IJ SOLN: 0.5000 mg | INTRAMUSCULAR | Status: DC | PRN

## 2014-04-10 MED ORDER — BUPIVACAINE HCL (PF) 0.5 % IJ SOLN
INTRAMUSCULAR | Status: DC | PRN
  Administered 2014-04-10: 3 mL

## 2014-04-10 MED ORDER — VANCOMYCIN HCL IN DEXTROSE 1-5 GM/200ML-% IV SOLN
INTRAVENOUS | Status: AC
  Filled 2014-04-10: qty 200

## 2014-04-10 MED ORDER — SODIUM CHLORIDE 0.9 % IJ SOLN
INTRAMUSCULAR | Status: AC
  Filled 2014-04-10: qty 50

## 2014-04-10 MED ORDER — TRANEXAMIC ACID 100 MG/ML IV SOLN
2000.0000 mg | Freq: Once | INTRAVENOUS | Status: DC
  Filled 2014-04-10: qty 20

## 2014-04-10 MED ORDER — ONDANSETRON HCL 4 MG/2ML IJ SOLN
INTRAMUSCULAR | Status: DC | PRN
Start: 2014-04-10 — End: 2014-04-10
  Administered 2014-04-10: 4 mg via INTRAVENOUS

## 2014-04-10 MED ORDER — METHOCARBAMOL 500 MG PO TABS
500.0000 mg | ORAL_TABLET | Freq: Four times a day (QID) | ORAL | Status: DC | PRN
  Administered 2014-04-11: 500 mg via ORAL
  Filled 2014-04-10: qty 1

## 2014-04-10 MED ORDER — KETOROLAC TROMETHAMINE 15 MG/ML IJ SOLN
7.5000 mg | Freq: Four times a day (QID) | INTRAMUSCULAR | Status: AC | PRN
  Administered 2014-04-10 – 2014-04-11 (×2): 7.5 mg via INTRAVENOUS
  Filled 2014-04-10 (×2): qty 1

## 2014-04-10 MED ORDER — FLEET ENEMA 7-19 GM/118ML RE ENEM: 1.0000 | ENEMA | Freq: Once | RECTAL | Status: AC | PRN

## 2014-04-10 MED ORDER — FENTANYL CITRATE 0.05 MG/ML IJ SOLN
INTRAMUSCULAR | Status: AC
  Filled 2014-04-10: qty 2

## 2014-04-10 MED ORDER — METOCLOPRAMIDE HCL 5 MG/ML IJ SOLN
INTRAMUSCULAR | Status: AC
  Filled 2014-04-10: qty 2

## 2014-04-10 MED ORDER — LEVOTHYROXINE SODIUM 50 MCG PO TABS
50.0000 ug | ORAL_TABLET | Freq: Every day | ORAL | Status: DC
  Administered 2014-04-11 – 2014-04-12 (×2): 50 ug via ORAL
  Filled 2014-04-10 (×4): qty 1

## 2014-04-10 MED ORDER — ONDANSETRON HCL 4 MG/2ML IJ SOLN: 4.0000 mg | Freq: Four times a day (QID) | INTRAMUSCULAR | Status: DC | PRN

## 2014-04-10 MED ORDER — ONDANSETRON HCL 4 MG PO TABS
4.0000 mg | ORAL_TABLET | Freq: Four times a day (QID) | ORAL | Status: DC | PRN
  Administered 2014-04-12: 4 mg via ORAL
  Filled 2014-04-10: qty 1

## 2014-04-10 MED ORDER — METOCLOPRAMIDE HCL 10 MG PO TABS: 5.0000 mg | ORAL_TABLET | Freq: Three times a day (TID) | ORAL | Status: DC | PRN

## 2014-04-10 MED ORDER — METHOCARBAMOL 1000 MG/10ML IJ SOLN
500.0000 mg | Freq: Four times a day (QID) | INTRAVENOUS | Status: DC | PRN
  Administered 2014-04-10 – 2014-04-11 (×2): 500 mg via INTRAVENOUS
  Filled 2014-04-10 (×4): qty 5

## 2014-04-10 SURGICAL SUPPLY — 41 items
BAG ZIPLOCK 12X15 (MISCELLANEOUS) IMPLANT
BLADE EXTENDED COATED 6.5IN (ELECTRODE) ×2 IMPLANT
BLADE SAG 18X100X1.27 (BLADE) ×2 IMPLANT
CAPT HIP TOTAL 2 ×2 IMPLANT
COVER PERINEAL POST (MISCELLANEOUS) ×2 IMPLANT
DECANTER SPIKE VIAL GLASS SM (MISCELLANEOUS) ×4 IMPLANT
DEVICE SECURE STRAP 25 ABSORB (INSTRUMENTS) IMPLANT
DRAPE C-ARM 42X120 X-RAY (DRAPES) ×2 IMPLANT
DRAPE STERI IOBAN 125X83 (DRAPES) ×2 IMPLANT
DRAPE U-SHAPE 47X51 STRL (DRAPES) ×6 IMPLANT
DRSG ADAPTIC 3X8 NADH LF (GAUZE/BANDAGES/DRESSINGS) ×2 IMPLANT
DRSG MEPILEX BORDER 4X4 (GAUZE/BANDAGES/DRESSINGS) ×2 IMPLANT
DRSG MEPILEX BORDER 4X8 (GAUZE/BANDAGES/DRESSINGS) ×2 IMPLANT
DURAPREP 26ML APPLICATOR (WOUND CARE) ×2 IMPLANT
ELECT REM PT RETURN 9FT ADLT (ELECTROSURGICAL) ×2
ELECTRODE REM PT RTRN 9FT ADLT (ELECTROSURGICAL) ×1 IMPLANT
EVACUATOR 1/8 PVC DRAIN (DRAIN) ×2 IMPLANT
FACESHIELD WRAPAROUND (MASK) ×8 IMPLANT
GLOVE BIO SURGEON STRL SZ7.5 (GLOVE) ×2 IMPLANT
GLOVE BIO SURGEON STRL SZ8 (GLOVE) ×4 IMPLANT
GLOVE BIOGEL PI IND STRL 8 (GLOVE) ×2 IMPLANT
GLOVE BIOGEL PI INDICATOR 8 (GLOVE) ×2
GOWN STRL REUS W/TWL LRG LVL3 (GOWN DISPOSABLE) ×2 IMPLANT
GOWN STRL REUS W/TWL XL LVL3 (GOWN DISPOSABLE) ×2 IMPLANT
HOLDER FOLEY CATH W/STRAP (MISCELLANEOUS) ×2 IMPLANT
KIT BASIN OR (CUSTOM PROCEDURE TRAY) ×2 IMPLANT
NDL SAFETY ECLIPSE 18X1.5 (NEEDLE) ×1 IMPLANT
NEEDLE HYPO 18GX1.5 SHARP (NEEDLE) ×1
PACK TOTAL JOINT (CUSTOM PROCEDURE TRAY) ×2 IMPLANT
PEN SKIN MARKING BROAD (MISCELLANEOUS) ×2 IMPLANT
STRIP CLOSURE SKIN 1/2X4 (GAUZE/BANDAGES/DRESSINGS) ×4 IMPLANT
SUT ETHIBOND NAB CT1 #1 30IN (SUTURE) ×2 IMPLANT
SUT MNCRL AB 4-0 PS2 18 (SUTURE) ×2 IMPLANT
SUT VIC AB 2-0 CT1 27 (SUTURE) ×2
SUT VIC AB 2-0 CT1 TAPERPNT 27 (SUTURE) ×2 IMPLANT
SUT VLOC 180 0 24IN GS25 (SUTURE) ×2 IMPLANT
SYR 20CC LL (SYRINGE) ×2 IMPLANT
SYR 50ML LL SCALE MARK (SYRINGE) ×2 IMPLANT
TOWEL OR 17X26 10 PK STRL BLUE (TOWEL DISPOSABLE) ×2 IMPLANT
TOWEL OR NON WOVEN STRL DISP B (DISPOSABLE) IMPLANT
TRAY FOLEY CATH 14FRSI W/METER (CATHETERS) ×2 IMPLANT

## 2014-04-10 NOTE — H&P (View-Only) (Signed)
Brooke Jimenez. Brooke Jimenez DOB: 05/08/1945 Married / Language: English / Race: White Female Date of Admission:  04/10/2014 CC:  Left Hip Pain History of Present Illness The patient is a 69 year old female who comes in for a preoperative History and Physical. The patient is scheduled for a left total hip arthroplasty (anterior approach) to be performed by Dr. Dione Plover. Aluisio, MD at [facility] on 04-10-2014. The patient is being followed for their left hip pain and osteoarthritis. They are several months out from hardware removal of the left hip) out. Symptoms reported today include: pain. The patient feels that they are doing poorly and report their pain level to be moderate. Unfortunately, her pain is persisting. When she rests, she tends to do pretty well, but she is definitely severely limited in what she can and cannot do. She is not doing things she desires because of the pain associated with that. She is at a stage now where she is willing to entertain any options to get her feeling better. She is ready to proceed with left hip replacement at this time. They have been treated conservatively in the past for the above stated problem and despite conservative measures, they continue to have progressive pain and severe functional limitations and dysfunction. They have failed non-operative management including home exercise, medications, and also hardware removal. It is felt that they would benefit from undergoing total joint replacement. Risks and benefits of the procedure have been discussed with the patient and they elect to proceed with surgery. There are no active contraindications to surgery such as ongoing infection or rapidly progressive neurological disease.   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 Erythromycin (Ophth) *OPHTHALMIC AGENTS* Nausea. Levaquin *Fluoroquinolones** Nausea. Sulfa Drugs Rash. Codeine Phosphate *ANALGESICS - OPIOID* Nausea. EPINEPHrine *Vasopressors** Rapid heart rate  Family History Cancer Mother. mother Kidney disease mother Severe allergy mother Cerebrovascular Accident mother and grandmother mothers side Congestive Heart Failure mother and grandmother mothers side Heart Disease mother, father, grandmother mothers side and grandfather fathers side Osteoarthritis mother Hypertension mother and grandmother mothers side  Social History Tobacco / smoke exposure no Previously in rehab no Pain Contract no Exercise Exercises weekly; does individual sport Alcohol use former drinker Marital status married Tobacco use Never smoker. never smoker Number of flights of stairs before winded 1 Current work status retired Children 2 Drug/Alcohol Rehab (Currently) no Illicit drug use no Living situation live with spouse  Medication History Ibuprofen (200MG  Tablet, Oral as needed) Active. Levothyroxine Sodium (50MCG Tablet, Oral) Active. Minocycline HCl (50MG  Tablet, Oral) Active. Vitamin B Complex (Oral as needed) Active. Magnesium Aspartate (Oral as needed) Specific dose unknown -  Active. Calcium Carbonate (Oral as needed) Specific dose unknown - Active. Multivitamin (Oral as needed) Active. Tylenol (1 Oral as needed) Specific dose unknown - Active.  Past Surgical History  Hip Fracture and Surgery left  Review of Systems General Not Present- Chills, Fatigue, Fever, Memory Loss,  Night Sweats, Weight Gain and Weight Loss. Skin Not Present- Eczema, Hives, Itching, Lesions and Rash. HEENT Present- Hearing Loss. Not Present- Dentures, Double Vision, Headache, Tinnitus and Visual Loss. Respiratory Not Present- Allergies, Chronic Cough, Coughing up blood, Shortness of breath at rest and Shortness of breath with exertion. Cardiovascular Present- Palpitations. Not Present- Chest Pain, Difficulty Breathing Lying Down, Murmur, Racing/skipping heartbeats and Swelling. Gastrointestinal Present- Heartburn. Not Present- Abdominal Pain, Bloody Stool, Constipation, Diarrhea, Difficulty Swallowing, Jaundice, Loss of appetitie, Nausea and Vomiting. Female Genitourinary Present- Incontinence, Urinary frequency and Urinating at Night. Not Present- Blood in Urine, Discharge, Flank Pain, Painful Urination, Urgency, Urinary Retention and Weak urinary stream. Musculoskeletal Present- Back Pain (lower), Joint Pain and Morning Stiffness. Not Present- Joint Swelling, Muscle Pain, Muscle Weakness and Spasms. Neurological Not Present- Blackout spells, Difficulty with balance, Dizziness, Paralysis, Tremor and Weakness. Psychiatric Not Present- Insomnia.  Vitals  Weight: 163 lb Height: 65in Weight was reported by patient. Height was reported by patient. Body Surface Area: 1.81 m Body Mass Index: 27.12 kg/m  BP: 144/82 (Sitting, Left Arm, Standard)  Physical Exam General Mental Status -Alert, cooperative and good historian. General Appearance-pleasant, Not in acute distress. Orientation-Oriented X3. Build & Nutrition-Well nourished and Well developed.  Head and  Neck Head-normocephalic, atraumatic . Neck Global Assessment - supple, no bruit auscultated on the right, no bruit auscultated on the left.  Eye Pupil - Bilateral-Regular and Round. Motion - Bilateral-EOMI.  Chest and Lung Exam Auscultation Breath sounds - clear at anterior chest wall and clear at posterior chest wall. Adventitious sounds - No Adventitious sounds.  Cardiovascular Auscultation Rhythm - Regular rate and rhythm. Heart Sounds - S1 WNL and S2 WNL. Murmurs & Other Heart Sounds - Auscultation of the heart reveals - No Murmurs.  Abdomen Palpation/Percussion Tenderness - Abdomen is non-tender to palpation. Rigidity (guarding) - Abdomen is soft. Auscultation Auscultation of the abdomen reveals - Bowel sounds normal.  Female Genitourinary Note: Not done, not pertinent to present illness   Musculoskeletal Note: On examination, she is alert and oriented in no apparent distress. She still walks with a significantly antalgic gait with an abductor lurch on the left. She has limited range of motion of that left hip compared to the right. She has had some slight trochanteric tenderness.  We reviewed her MRI scan. She has a large avascular area in the femoral head which does not appear acute as there is no surrounding edema. She does have a large cyst in the superior acetabulum as well as some bone-on-bone change superolateral.   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

## 2014-04-10 NOTE — Anesthesia Postprocedure Evaluation (Signed)
  Anesthesia Post-op Note  Patient: Brooke Jimenez  Procedure(s) Performed: Procedure(s) (LRB): LEFT TOTAL HIP ARTHROPLASTY ANTERIOR APPROACH (Left)  Patient Location: PACU  Anesthesia Type: Spinal  Level of Consciousness: awake and alert   Airway and Oxygen Therapy: Patient Spontanous Breathing  Post-op Pain: mild  Post-op Assessment: Post-op Vital signs reviewed, Patient's Cardiovascular Status Stable, Respiratory Function Stable, Patent Airway and No signs of Nausea or vomiting  Last Vitals:  Filed Vitals:   04/10/14 1636  BP:   Pulse: 64  Temp: 36.4 C  Resp:     Post-op Vital Signs: stable   Complications: No apparent anesthesia complications

## 2014-04-10 NOTE — Anesthesia Procedure Notes (Signed)
Spinal Patient location during procedure: OR Start time: 04/10/2014 2:45 PM Staffing Anesthesiologist: Lauretta Grill JENNETTE Performed by: anesthesiologist  Preanesthetic Checklist Completed: patient identified, site marked, surgical consent, pre-op evaluation, timeout performed, IV checked, risks and benefits discussed and monitors and equipment checked Spinal Block Patient position: sitting Prep: Betadine Patient monitoring: heart rate, continuous pulse ox and blood pressure Location: L3-4 Injection technique: single-shot Needle Needle type: Sprotte  Needle gauge: 24 G Needle length: 9 cm Additional Notes Functioning IV was confirmed and monitors were applied. Sterile prep and drape, including hand hygiene, mask and sterile gloves were used. The patient was positioned and the spine was prepped. The skin was anesthetized with lidocaine.  Free flow of clear CSF was obtained prior to injecting local anesthetic into the CSF.  The spinal needle aspirated freely following injection.  The needle was carefully withdrawn.  The patient tolerated the procedure well. Consent was obtained prior to procedure with all questions answered and concerns addressed. Risks including but not limited to bleeding, infection, nerve damage, paralysis, failed block, inadequate analgesia, allergic reaction, high spinal, itching and headache were discussed and the patient wished to proceed.   Lauretta Grill, MD

## 2014-04-10 NOTE — Op Note (Signed)
OPERATIVE REPORT  PREOPERATIVE DIAGNOSIS: Avascular necrosis of the Left hip.   POSTOPERATIVE DIAGNOSIS: Avascular necrosis of the Left  hip.   PROCEDURE: Left total hip arthroplasty, anterior approach.   SURGEON: Gaynelle Arabian, MD   ASSISTANT: Arlee Muslim, PA-C  ANESTHESIA:  Spinal  ESTIMATED BLOOD LOSS:-350 ml    DRAINS: Hemovac x1.   COMPLICATIONS: None   CONDITION: PACU - hemodynamically stable.   BRIEF CLINICAL NOTE: Brooke Jimenez is a 69 y.o. 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   PROCEDURE IN DETAIL: After successful administration of spinal  anesthetic, the traction boots for the [facility] bed were placed on both  feet and the patient was placed onto the [facility] bed, boots placed into the leg  holders. The Left hip was then isolated from the perineum with plastic  drapes and prepped and draped in the usual sterile fashion. ASIS and  greater trochanter were marked and a oblique incision was made, starting  at about 1 cm lateral and 2 cm distal to the ASIS and coursing towards  the anterior cortex of the femur. The skin was cut with a 10 blade  through subcutaneous tissue to the level of the fascia overlying the  tensor fascia lata muscle. The fascia was then incised in line with the  incision at the junction of the anterior third and posterior 2/3rd. The  muscle was teased off the fascia and then the interval between the TFL  and the rectus was developed. The Hohmann retractor was then placed at  the top of the femoral neck over the capsule. The vessels overlying the  capsule were cauterized and the fat on top of the capsule was removed.  A Hohmann retractor was then placed anterior underneath the rectus  femoris to give exposure to the entire anterior capsule. A T-shaped  capsulotomy was  performed. The edges were tagged and the femoral head  was identified.       Osteophytes are removed off the superior acetabulum.  The femoral neck was then cut in situ with an oscillating saw. Traction  was then applied to the left lower extremity utilizing the [facility]  traction. The femoral head was then removed. Retractors were placed  around the acetabulum and then circumferential removal of the labrum was  performed. Osteophytes were also removed. Reaming starts at 45 mm to  medialize and  Increased in 2 mm increments to 49 mm. We reamed in  approximately 40 degrees of abduction, 20 degrees anteversion. A 50 mm  pinnacle acetabular shell was then impacted in anatomic position under  fluoroscopic guidance with excellent purchase. We did not need to place  any additional dome screws. A 32 mm neutral + 4 marathon liner was then  placed into the acetabular shell.       The femoral lift was then placed along the lateral aspect of the femur  just distal to the vastus ridge. The leg was  externally rotated and capsule  was stripped off the inferior aspect of the femoral neck down to the  level of the lesser trochanter, this was done with electrocautery. The femur was lifted after this was performed. The  leg was then placed and extended in adducted position to essentially delivering the femur. We also removed the capsule superiorly and the  piriformis from  the piriformis fossa to gain excellent exposure of the  proximal femur. Rongeur was used to remove some cancellous bone to get  into the lateral portion of the proximal femur for placement of the  initial starter reamer. The starter broaches was placed  the starter broach  and was shown to go down the center of the canal. Broaching  with the  Corail system was then performed starting at size 8, coursing  Up to size 12. A size 12 had excellent torsional and rotational  and axial stability. The trial standard offset neck was then placed  with a  32 + 1 trial head. The hip was then reduced. We confirmed that  the stem was in the canal both on AP and lateral x-rays. It also has excellent sizing. The hip was reduced with outstanding stability through full extension, full external rotation,  and then flexion in adduction internal rotation. AP pelvis was taken  and the leg lengths were measured and found to be exactly equal. Hip  was then dislocated again and the femoral head and neck removed. The  femoral broach was removed. Size 12 Corail stem with a standard offset  neck was then impacted into the femur following native anteversion. Has  excellent purchase in the canal. Excellent torsional and rotational and  axial stability. It is confirmed to be in the canal on AP and lateral  fluoroscopic views. The 32 + 1 ceramic head was placed and the hip  reduced with outstanding stability. Again AP pelvis was taken and it  confirmed that the leg lengths were equal. The wound was then copiously  irrigated with saline solution and the capsule reattached and repaired  with Ethibond suture.  20 mL of Exparel mixed with 50 mL of saline then additional 20 ml of .25% Bupivicaine injected into the capsule and into the edge of the tensor fascia lata as well as subcutaneous tissue. The fascia overlying the tensor fascia lata was  then closed with a running #1 V-Loc. Subcu was closed with interrupted  2-0 Vicryl and subcuticular running 4-0 Monocryl. Incision was cleaned  and dried. Steri-Strips and a bulky sterile dressing applied. Hemovac  drain was hooked to suction and then he was awakened and transported to  recovery in stable condition.        Please note that a surgical assistant was a medical necessity for this procedure to perform it in a safe and expeditious manner. Assistant was necessary to provide appropriate retraction of vital neurovascular structures and to prevent femoral fracture and allow for anatomic placement of the prosthesis.  Gaynelle Arabian, M.D.

## 2014-04-10 NOTE — Interval H&P Note (Signed)
History and Physical Interval Note:  04/10/2014 2:45 PM  Brooke Jimenez  has presented today for surgery, with the diagnosis of avascular neucrosis of the left hip  The various methods of treatment have been discussed with the patient and family. After consideration of risks, benefits and other options for treatment, the patient has consented to  Procedure(s): LEFT TOTAL HIP ARTHROPLASTY ANTERIOR APPROACH (Left) as a surgical intervention .  The patient's history has been reviewed, patient examined, no change in status, stable for surgery.  I have reviewed the patient's chart and labs.  Questions were answered to the patient's satisfaction.     Gearlean Alf

## 2014-04-10 NOTE — Transfer of Care (Signed)
Immediate Anesthesia Transfer of Care Note  Patient: Brooke Jimenez  Procedure(s) Performed: Procedure(s): LEFT TOTAL HIP ARTHROPLASTY ANTERIOR APPROACH (Left)  Patient Location: PACU  Anesthesia Type:Regional and Spinal  Level of Consciousness: awake, sedated and patient cooperative  Airway & Oxygen Therapy: Patient Spontanous Breathing and Patient connected to face mask oxygen  Post-op Assessment: Report given to RN and Post -op Vital signs reviewed and stable  Post vital signs: Reviewed and stable  Last Vitals:  Filed Vitals:   04/10/14 1244  BP: 138/74  Pulse:   Temp:   Resp: 16    Complications: No apparent anesthesia complications

## 2014-04-11 ENCOUNTER — Encounter (HOSPITAL_COMMUNITY): Payer: Self-pay | Admitting: Orthopedic Surgery

## 2014-04-11 LAB — CBC
HEMATOCRIT: 33.3 % — AB (ref 36.0–46.0)
Hemoglobin: 10.7 g/dL — ABNORMAL LOW (ref 12.0–15.0)
MCH: 29.2 pg (ref 26.0–34.0)
MCHC: 32.1 g/dL (ref 30.0–36.0)
MCV: 90.7 fL (ref 78.0–100.0)
PLATELETS: 187 10*3/uL (ref 150–400)
RBC: 3.67 MIL/uL — ABNORMAL LOW (ref 3.87–5.11)
RDW: 13.9 % (ref 11.5–15.5)
WBC: 8.5 10*3/uL (ref 4.0–10.5)

## 2014-04-11 LAB — BASIC METABOLIC PANEL
Anion gap: 5 (ref 5–15)
BUN: 8 mg/dL (ref 6–23)
CO2: 25 mmol/L (ref 19–32)
Calcium: 9.2 mg/dL (ref 8.4–10.5)
Chloride: 111 mmol/L (ref 96–112)
Creatinine, Ser: 0.67 mg/dL (ref 0.50–1.10)
GFR calc Af Amer: >90 mL/min (ref >90)
GFR calc non Af Amer: 88 mL/min — ABNORMAL LOW (ref >90)
Glucose, Bld: 178 mg/dL — ABNORMAL HIGH (ref 70–99)
POTASSIUM: 3.5 mmol/L (ref 3.5–5.1)
SODIUM: 141 mmol/L (ref 135–145)

## 2014-04-11 MED ORDER — METHOCARBAMOL 500 MG PO TABS: 500.0000 mg | ORAL_TABLET | Freq: Four times a day (QID) | ORAL | Status: DC | PRN

## 2014-04-11 MED ORDER — TRAMADOL HCL 50 MG PO TABS: 50.0000 mg | ORAL_TABLET | Freq: Four times a day (QID) | ORAL | Status: DC | PRN

## 2014-04-11 MED ORDER — MINOCYCLINE HCL 50 MG PO CAPS
50.0000 mg | ORAL_CAPSULE | Freq: Two times a day (BID) | ORAL | Status: DC
  Administered 2014-04-11 – 2014-04-12 (×3): 50 mg via ORAL
  Filled 2014-04-11 (×4): qty 1

## 2014-04-11 MED ORDER — HYDROMORPHONE HCL 2 MG PO TABS: 2.0000 mg | ORAL_TABLET | ORAL | Status: DC | PRN

## 2014-04-11 MED ORDER — RIVAROXABAN 10 MG PO TABS: 10.0000 mg | ORAL_TABLET | Freq: Every day | ORAL | Status: DC

## 2014-04-11 NOTE — Discharge Instructions (Addendum)
°Dr. Frank Aluisio °Total Joint Specialist °[facility] °[address] °[place], [place] [address] °[phone] ° ° ° °ANTERIOR APPROACH TOTAL HIP REPLACEMENT POSTOPERATIVE DIRECTIONS ° ° °Hip Rehabilitation, Guidelines Following Surgery  °The results of a hip operation are greatly improved after range of motion and muscle strengthening exercises. Follow all safety measures which are given to protect your hip. If any of these exercises cause increased pain or swelling in your joint, decrease the amount until you are comfortable again. Then slowly increase the exercises. Call your caregiver if you have problems or questions.  °HOME CARE INSTRUCTIONS  °Most of the following instructions are designed to prevent the dislocation of your new hip.  °Remove items at home which could result in a fall. This includes throw rugs or furniture in walking pathways.  °Continue medications as instructed at time of discharge. °· You may have some home medications which will be placed on hold until you complete the course of blood thinner medication. °· You may start showering once you are discharged home but do not submerge the incision under water. Just pat the incision dry and apply a dry gauze dressing on daily. °Do not put on socks or shoes without following the instructions of your caregivers.  °Sit on high chairs which makes it easier to stand.  °Sit on chairs with arms. Use the chair arms to help push yourself up when arising.  °Keep your leg on the side of the operation out in front of you when standing up.  °Arrange for the use of a toilet seat elevator so you are not sitting low.   °· Walk with walker as instructed.  °You may resume a sexual relationship in one month or when given the OK by your caregiver.  °Use walker as long as suggested by your caregivers.  °You may put full weight on your legs and walk as much as is comfortable. °Avoid periods of inactivity such as sitting longer than an hour  when not asleep. This helps prevent blood clots.  °You may return to work once you are cleared by your surgeon.  °Do not drive a car for 6 weeks or until released by your surgeon.  °Do not drive while taking narcotics.  °Wear elastic stockings for three weeks following surgery during the day but you may remove then at night.  °Make sure you keep all of your appointments after your operation with all of your doctors and caregivers. You should call the office at the above phone number and make an appointment for approximately two weeks after the date of your surgery. °Change the dressing daily and reapply a dry dressing each time. °Please pick up a stool softener and laxative for home use as long as you are requiring pain medications. °· ICE to the affected hip every three hours for 30 minutes at a time and then as needed for pain and swelling.  Continue to use ice on the hip for pain and swelling from surgery. You may notice swelling that will progress down to the foot and ankle.  This is normal after surgery.  Elevate the leg when you are not up walking on it.   °It is important for you to complete the blood thinner medication as prescribed by your doctor. °· Continue to use the breathing machine which will help keep your temperature down.  It is common for your temperature to cycle up and down following surgery, especially at night when you are not up moving around   and exerting yourself.  The breathing machine keeps your lungs expanded and your temperature down. ° °RANGE OF MOTION AND STRENGTHENING EXERCISES  °These exercises are designed to help you keep full movement of your hip joint. Follow your caregiver's or physical therapist's instructions. Perform all exercises about fifteen times, three times per day or as directed. Exercise both hips, even if you have had only one joint replacement. These exercises can be done on a training (exercise) mat, on the floor, on a table or on a bed. Use whatever works the best  and is most comfortable for you. Use music or television while you are exercising so that the exercises are a pleasant break in your day. This will make your life better with the exercises acting as a break in routine you can look forward to.  °Lying on your back, slowly slide your foot toward your buttocks, raising your knee up off the floor. Then slowly slide your foot back down until your leg is straight again.  °Lying on your back spread your legs as far apart as you can without causing discomfort.  °Lying on your side, raise your upper leg and foot straight up from the floor as far as is comfortable. Slowly lower the leg and repeat.  °Lying on your back, tighten up the muscle in the front of your thigh (quadriceps muscles). You can do this by keeping your leg straight and trying to raise your heel off the floor. This helps strengthen the largest muscle supporting your knee.  °Lying on your back, tighten up the muscles of your buttocks both with the legs straight and with the knee bent at a comfortable angle while keeping your heel on the floor.  ° °SKILLED REHAB INSTRUCTIONS: °If the patient is transferred to a skilled rehab facility following release from the hospital, a list of the current medications will be sent to the facility for the patient to continue.  When discharged from the skilled rehab facility, please have the facility set up the patient's Home Health Physical Therapy prior to being released. Also, the skilled facility will be responsible for providing the patient with their medications at time of release from the facility to include their pain medication, the muscle relaxants, and their blood thinner medication. If the patient is still at the rehab facility at time of the two week follow up appointment, the skilled rehab facility will also need to assist the patient in arranging follow up appointment in our office and any transportation needs. ° °MAKE SURE YOU:  °Understand these instructions.    °Will watch your condition.  °Will get help right away if you are not doing well or get worse. ° °Pick up stool softner and laxative for home use following surgery while on pain medications. °Do not submerge incision under water. °Please use good hand washing techniques while changing dressing each day. °May shower starting three days after surgery. °Please use a clean towel to pat the incision dry following showers. °Continue to use ice for pain and swelling after surgery. °Do not use any lotions or creams on the incision until instructed by your surgeon. °Total Hip Protocol. ° °Take Xarelto for two and a half more weeks, then discontinue Xarelto. °Once the patient has completed the blood thinner regimen, then take a Baby 81 mg Aspirin daily for three more weeks. ° ° °Postoperative Constipation Protocol ° °Constipation - defined medically as fewer than three stools per week and severe constipation as less than one stool per week. ° °  One of the most common issues patients have following surgery is constipation.  Even if you have a regular bowel pattern at home, your normal regimen is likely to be disrupted due to multiple reasons following surgery.  Combination of anesthesia, postoperative narcotics, change in appetite and fluid intake all can affect your bowels.  In order to avoid complications following surgery, here are some recommendations in order to help you during your recovery period. ° °Colace (docusate) - Pick up an over-the-counter form of Colace or another stool softener and take twice a day as long as you are requiring postoperative pain medications.  Take with a full glass of water daily.  If you experience loose stools or diarrhea, hold the colace until you stool forms back up.  If your symptoms do not get better within 1 week or if they get worse, check with your doctor. ° °Dulcolax (bisacodyl) - Pick up over-the-counter and take as directed by the product packaging as needed to assist with the  movement of your bowels.  Take with a full glass of water.  Use this product as needed if not relieved by Colace only.  ° °MiraLax (polyethylene glycol) - Pick up over-the-counter to have on hand.  MiraLax is a solution that will increase the amount of water in your bowels to assist with bowel movements.  Take as directed and can mix with a glass of water, juice, soda, coffee, or tea.  Take if you go more than two days without a movement. °Do not use MiraLax more than once per day. Call your doctor if you are still constipated or irregular after using this medication for 7 days in a row. ° °If you continue to have problems with postoperative constipation, please contact the office for further assistance and recommendations.  If you experience "the worst abdominal pain ever" or develop nausea or vomiting, please contact the office immediatly for further recommendations for treatment. ° ° °Information on my medicine - XARELTO® (Rivaroxaban) ° °This medication education was reviewed with me or my healthcare representative as part of my discharge preparation.  The pharmacist that spoke with me during my hospital stay was:  Gadhia, Jigna M, RPH ° °Why was Xarelto® prescribed for you? °Xarelto® was prescribed for you to reduce the risk of blood clots forming after orthopedic surgery. The medical term for these abnormal blood clots is venous thromboembolism (VTE). ° °What do you need to know about xarelto® ? °Take your Xarelto® ONCE DAILY at the same time every day. °You may take it either with or without food. ° °If you have difficulty swallowing the tablet whole, you may crush it and mix in applesauce just prior to taking your dose. ° °Take Xarelto® exactly as prescribed by your doctor and DO NOT stop taking Xarelto® without talking to the doctor who prescribed the medication.  Stopping without other VTE prevention medication to take the place of Xarelto® may increase your risk of developing a clot. ° °After discharge,  you should have regular check-up appointments with your healthcare provider that is prescribing your Xarelto®.   ° °What do you do if you miss a dose? °If you miss a dose, take it as soon as you remember on the same day then continue your regularly scheduled once daily regimen the next day. Do not take two doses of Xarelto® on the same day.  ° °Important Safety Information °A possible side effect of Xarelto® is bleeding. You should call your healthcare provider right away if you experience   any of the following: °? Bleeding from an injury or your nose that does not stop. °? Unusual colored urine (red or dark brown) or unusual colored stools (red or black). °? Unusual bruising for unknown reasons. °? A serious fall or if you hit your head (even if there is no bleeding). ° °Some medicines may interact with Xarelto® and might increase your risk of bleeding while on Xarelto®. To help avoid this, consult your healthcare provider or pharmacist prior to using any new prescription or non-prescription medications, including herbals, vitamins, non-steroidal anti-inflammatory drugs (NSAIDs) and supplements. ° °This website has more information on Xarelto®: www.xarelto.com. ° ° ° °

## 2014-04-11 NOTE — Evaluation (Signed)
Physical Therapy Evaluation Patient Details Name: Brooke Jimenez MRN: [id number] DOB: 1945-11-26 Today's Date: 04/11/2014   History of Present Illness  [name]  Clinical Impression  Patient tolerated very well. Patient will benefit from PT to address problems listed in note below.    Follow Up Recommendations Home health PT;Supervision/Assistance - 24 hour    Equipment Recommendations  None recommended by PT    Recommendations for Other Services       Precautions / Restrictions Precautions Precautions: Fall Restrictions Weight Bearing Restrictions: No      Mobility  Bed Mobility Overal bed mobility: Needs Assistance Bed Mobility: Rolling Rolling: Supervision         General bed mobility comments: pt requested to  roll and then sit up, required no assistance  Transfers Overall transfer level: Needs assistance Equipment used: Rolling walker (2 wheeled) Transfers: Sit to/from Stand Sit to Stand: Min guard         General transfer comment: cues for hand and L leg position  Ambulation/Gait Ambulation/Gait assistance: Min assist Ambulation Distance (Feet): 225 Feet Assistive device: Rolling walker (2 wheeled) Gait Pattern/deviations: Step-to pattern;Step-through pattern;Antalgic     General Gait Details: cues for sequence  Stairs            Wheelchair Mobility    Modified Rankin (Stroke Patients Only)       Balance                                             Pertinent Vitals/Pain Pain Assessment: 0-10 Pain Score: 1  Pain Location: L thigh Pain Descriptors / Indicators: Burning Pain Intervention(s): Monitored during session;Ice applied    Home Living Family/patient expects to be discharged to:: Private residence Living Arrangements: Spouse/significant other Available Help at Discharge: Family Type of Home: House Home Access: Stairs to enter Entrance Stairs-Rails: Psychiatric nurse of Steps: 3 Home  Layout: One level Home Equipment: Environmental consultant - 2 wheels      Prior Function Level of Independence: Independent               Hand Dominance        Extremity/Trunk Assessment               Lower Extremity Assessment: LLE deficits/detail   LLE Deficits / Details: able to flex hip w/out assistance     Communication   Communication: No difficulties  Cognition Arousal/Alertness: Awake/alert Behavior During Therapy: WFL for tasks assessed/performed                        General Comments      Exercises Total Joint Exercises Ankle Circles/Pumps: AROM;Both;10 reps;Supine Short Arc Quad: AROM;Left;10 reps;Supine Heel Slides: AROM;Left;10 reps;Supine Hip ABduction/ADduction: AROM;Left;10 reps;Supine Long Arc Quad: AROM;Left;10 reps;Seated      Assessment/Plan    PT Assessment Patient needs continued PT services  PT Diagnosis Difficulty walking   PT Problem List Decreased strength;Decreased range of motion;Decreased activity tolerance;Decreased mobility;Decreased knowledge of precautions;Decreased safety awareness;Decreased knowledge of use of DME  PT Treatment Interventions DME instruction;Gait training;Stair training;Functional mobility training;Therapeutic activities;Therapeutic exercise;Patient/family education   PT Goals (Current goals can be found in the Care Plan section) Acute Rehab PT Goals Patient Stated Goal: to walk without pain PT Goal Formulation: With patient/family Time For Goal Achievement: 04/15/14 Potential to Achieve Goals: Good    Frequency 7X/week  Barriers to discharge        Co-evaluation               End of Session   Activity Tolerance: Patient tolerated treatment well Patient left: in chair;with call bell/phone within reach Nurse Communication: Mobility status         Time: 5361-4431 PT Time Calculation (min) (ACUTE ONLY): 37 min   Charges:   PT Evaluation $Initial PT Evaluation Tier I: 1 Procedure PT  Treatments $Gait Training: 8-22 mins   PT G Codes:        Brooke Jimenez 04/11/2014, 11:14 AM

## 2014-04-11 NOTE — Progress Notes (Signed)
Physical Therapy Treatment Patient Details Name: Shariya Gaster MRN: [id number] DOB: [birth year] Today's Date: 04/11/2014    History of Present Illness [name]    PT Comments    Patient is progressing very well. No c/o pain.  Follow Up Recommendations  Home health PT;Supervision/Assistance - 24 hour     Equipment Recommendations  None recommended by PT    Recommendations for Other Services       Precautions / Restrictions Precautions Precautions: Fall    Mobility  Bed Mobility   Bed Mobility: Sit to Supine       Sit to supine: Min assist   General bed mobility comments: assist with legs onto bed.  Transfers   Equipment used: Rolling walker (2 wheeled) Transfers: Sit to/from Stand Sit to Stand: Supervision         General transfer comment: min cues for hand placement and LE management.   Ambulation/Gait Ambulation/Gait assistance: Supervision Ambulation Distance (Feet): 200 Feet   Gait Pattern/deviations: Step-through pattern;Antalgic     General Gait Details: cues for sequence   Stairs            Wheelchair Mobility    Modified Rankin (Stroke Patients Only)       Balance                                    Cognition Arousal/Alertness: Awake/alert                          Exercises      General Comments        Pertinent Vitals/Pain Pain Score: 1  Pain Location: L hip Pain Descriptors / Indicators: Sore Pain Intervention(s): Monitored during session;Ice applied    Home Living                      Prior Function            PT Goals (current goals can now be found in the care plan section) Progress towards PT goals: Progressing toward goals    Frequency  7X/week    PT Plan Current plan remains appropriate    Co-evaluation             End of Session   Activity Tolerance: Patient tolerated treatment well Patient left: in bed;with call bell/phone within reach     Time:  1403-1420 PT Time Calculation (min) (ACUTE ONLY): 17 min  Charges:  $Gait Training: 8-22 mins                    G Codes:      [name] 04/11/2014, 6:34 PM

## 2014-04-11 NOTE — Progress Notes (Addendum)
Subjective: 1 Day Post-Op Procedure(s) (LRB): LEFT TOTAL HIP ARTHROPLASTY ANTERIOR APPROACH (Left) Patient reports pain as mild and moderate.   Patient seen in rounds with Dr. Wynelle Link. Husband in room. Patient is well, but has had some minor complaints of pain in the hip, requiring pain medications We will start therapy today.  Plan is to go Home after hospital stay.  Objective: Vital signs in last 24 hours: Temp:  [97.5 F (36.4 C)-98.7 F (37.1 C)] 97.9 F (36.6 C) (02/16 0616) Pulse Rate:  [64-91] 65 (02/16 0616) Resp:  [14-18] 18 (02/16 0752) BP: (105-138)/(53-92) 111/74 mmHg (02/16 0616) SpO2:  [97 %-100 %] 97 % (02/16 0752) Weight:  [74.39 kg (164 lb)] 74.39 kg (164 lb) (02/15 1744)  Intake/Output from previous day:  Intake/Output Summary (Last 24 hours) at 04/11/14 0810 Last data filed at 04/11/14 0600  Gross per 24 hour  Intake 4163.75 ml  Output   5075 ml  Net -911.25 ml    Labs:  Recent Labs  04/11/14 0451  HGB 10.7*    Recent Labs  04/11/14 0451  WBC 8.5  RBC 3.67*  HCT 33.3*  PLT 187    Recent Labs  04/11/14 0451  NA 141  K 3.5  CL 111  CO2 25  BUN 8  CREATININE 0.67  GLUCOSE 178*  CALCIUM 9.2   No results for input(s): LABPT, INR in the last 72 hours.  EXAM General - Patient is Alert, Appropriate and Oriented Extremity - Neurovascular intact Sensation intact distally Dorsiflexion/Plantar flexion intact Dressing - dressing C/D/I Motor Function - intact, moving foot and toes well on exam.  Hemovac pulled without difficulty.  Past Medical History  Diagnosis Date  . Mitral valve prolapse   . Hypothyroidism   . Anxiety   . H/O hiatal hernia   . History of melanoma     melanoma- 1986 and 1997   . PTSD (post-traumatic stress disorder) 2006  . Corneal dystrophy   . Dysphagia     chronic- please see ultrasound done 4/16 15 in [facility]   . Hearing loss     bilateral due to nerve loss. wears hearing aides  . Family history of  adverse reaction to anesthesia     mother slow to wake up   . Dysrhythmia     sometimes patient has extra beats per patient   . Heart murmur     no problem   . Nocturia   . Arthritis     arthritis in hip   . Peripheral neuropathy     feet  . GERD (gastroesophageal reflux disease)     under control  . Esophageal dysmotility   . Low back pain with sciatica   . DDD (degenerative disc disease), cervical     and lower back  . Glaucoma     "suspect"  . Cataract     beginning  . Varicose veins   . Spider veins   . Diverticulosis   . Osteoporosis   . Urinary incontinence     occasional  . Gout     one finger x1 attack  . Measles     hx of  . History of chicken pox   . Acne rosacea   . Complication of anesthesia     "really sore throat"  . PONV (postoperative nausea and vomiting)   . RSD (reflex sympathetic dystrophy) 1993    Assessment/Plan: 1 Day Post-Op Procedure(s) (LRB): LEFT TOTAL HIP ARTHROPLASTY ANTERIOR APPROACH (Left) Principal Problem:  Avascular necrosis of femur head, left Active Problems:   OA (osteoarthritis) of hip  Estimated body mass index is 26.48 kg/(m^2) as calculated from the following:   Height as of this encounter: 5\' 6"  (1.676 m).   Weight as of this encounter: 74.39 kg (164 lb). Advance diet Up with therapy Plan for discharge tomorrow Discharge home with home health  DVT Prophylaxis - Xarelto Weight Bearing As Tolerated left Leg Hemovac Pulled Begin Therapy  Patient is asking about her minocycline which she takes for her rosacea.  Will restart at this time.  In order to satisfy SCIP issues, documenting that she is taking this for other preexisting condition and not related to her recent surgery.  Arlee Muslim, PA-C Orthopaedic Surgery 04/11/2014, 8:10 AM

## 2014-04-11 NOTE — Evaluation (Signed)
Occupational Therapy Evaluation Patient Details Name: Brooke Jimenez MRN: [id number] DOB: 03/12/1945 Today's Date: 04/11/2014    History of Present Illness [name]   Clinical Impression   Pt doing well and overall at min to mod assist with ADL. Will follow to progress ADL independence for return home with spouse.     Follow Up Recommendations  No OT follow up;Supervision/Assistance - 24 hour    Equipment Recommendations  None recommended by OT    Recommendations for Other Services       Precautions / Restrictions Precautions Precautions: Fall Restrictions Weight Bearing Restrictions: No      Mobility Bed Mobility             Transfers Overall transfer level: Needs assistance Equipment used: Rolling walker (2 wheeled) Transfers: Sit to/from Stand Sit to Stand: Min assist         General transfer comment: min cues for hand placement and LE management.     Balance                                            ADL Overall ADL's : Needs assistance/impaired Eating/Feeding: Independent;Sitting   Grooming: Wash/dry hands;Set up;Sitting   Upper Body Bathing: Set up;Sitting   Lower Body Bathing: Minimal assistance;Sit to/from stand   Upper Body Dressing : Set up;Sitting   Lower Body Dressing: Moderate assistance;Sit to/from stand   Toilet Transfer: Minimal assistance;Ambulation;BSC;RW   Toileting- Clothing Manipulation and Hygiene: Minimal assistance;Sit to/from stand         General ADL Comments: Educated pt on AE options for LB self care. She has a reacher and shoe horn and would like to do as much as she can for herself. She needs min assist and mod cues for hand placement and LE management for functional transfers.       Vision     Perception     Praxis      Pertinent Vitals/Pain Pain Assessment: 0-10 Pain Score: 1  Pain Location: L thigh Pain Descriptors / Indicators: Burning Pain Intervention(s): Monitored during  session;Ice applied     Hand Dominance     Extremity/Trunk Assessment Upper Extremity Assessment Upper Extremity Assessment: Overall WFL for tasks assessed          Communication Communication Communication: No difficulties   Cognition Arousal/Alertness: Awake/alert Behavior During Therapy: WFL for tasks assessed/performed Overall Cognitive Status: Within Functional Limits for tasks assessed                     General Comments       Exercises       Shoulder Instructions      Home Living Family/patient expects to be discharged to:: Private residence Living Arrangements: Spouse/significant other Available Help at Discharge: Family Type of Home: House Home Access: Stairs to enter Technical brewer of Steps: 3 Entrance Stairs-Rails: Right;Left Home Layout: One level     Bathroom Shower/Tub: Occupational psychologist: Standard     Home Equipment: Environmental consultant - 2 wheels;Bedside commode;Adaptive equipment Adaptive Equipment: Sock aid;Long-handled shoe horn        Prior Functioning/Environment Level of Independence: Independent             OT Diagnosis: Generalized weakness   OT Problem List: Decreased strength;Decreased knowledge of use of DME or AE   OT Treatment/Interventions: Self-care/ADL training;Patient/family education;Therapeutic activities;DME and/or AE  instruction    OT Goals(Current goals can be found in the care plan section) Acute Rehab OT Goals Patient Stated Goal: move better OT Goal Formulation: With patient Time For Goal Achievement: 04/18/14 Potential to Achieve Goals: Good  OT Frequency: Min 2X/week   Barriers to D/C:            Co-evaluation              End of Session Equipment Utilized During Treatment: Rolling walker  Activity Tolerance: Patient tolerated treatment well Patient left: in chair;with call bell/phone within reach   Time: 1045-1110 OT Time Calculation (min): 25 min Charges:  OT  General Charges $OT Visit: 1 Procedure OT Evaluation $Initial OT Evaluation Tier I: 1 Procedure OT Treatments $Therapeutic Activity: 8-22 mins G-Codes:    Jules Schick  [phone] 04/11/2014, 11:47 AM

## 2014-04-11 NOTE — Care Management (Signed)
CARE MANAGEMENT NOTE 04/11/2014  Patient:  Elbert,Ysidra   Account Number:  [id number]  Date Initiated:  04/11/2014  Documentation initiated by:  DAVIS,RHONDA  Subjective/Objective Assessment:   Left total hip arthroplasty, anterior approach.     Action/Plan:   hhc   Anticipated DC Date:  04/12/2014   Anticipated DC Plan:  [place]  In-house referral  NA      DC Planning Services  CM consult      [facility] Choice  NA   Choice offered to / List presented to:  C-1 Patient   DME arranged  NA      DME agency  NA     [facility] arranged  HH-2 PT      [place]   Status of service:  In process, will continue to follow Medicare Important Message given?   (If response is "NO", the following Medicare IM given date fields will be blank) Date Medicare IM given:   Medicare IM given by:   Date Additional Medicare IM given:   Additional Medicare IM given by:    Discharge Disposition:    Per UR Regulation:  Reviewed for med. necessity/level of care/duration of stay  If discussed at [place] of Stay Meetings, dates discussed:    Comments:  Suanne Marker Davis,RN,BSN,CCM

## 2014-04-11 NOTE — Progress Notes (Signed)
Utilization review completed.  

## 2014-04-12 LAB — CBC
HCT: 32.1 % — ABNORMAL LOW (ref 36.0–46.0)
HEMOGLOBIN: 10.3 g/dL — AB (ref 12.0–15.0)
MCH: 29.3 pg (ref 26.0–34.0)
MCHC: 32.1 g/dL (ref 30.0–36.0)
MCV: 91.2 fL (ref 78.0–100.0)
Platelets: 198 10*3/uL (ref 150–400)
RBC: 3.52 MIL/uL — AB (ref 3.87–5.11)
RDW: 14.2 % (ref 11.5–15.5)
WBC: 11.2 10*3/uL — AB (ref 4.0–10.5)

## 2014-04-12 LAB — BASIC METABOLIC PANEL
ANION GAP: 6 (ref 5–15)
BUN: 8 mg/dL (ref 6–23)
CHLORIDE: 111 mmol/L (ref 96–112)
CO2: 26 mmol/L (ref 19–32)
CREATININE: 0.55 mg/dL (ref 0.50–1.10)
Calcium: 9 mg/dL (ref 8.4–10.5)
GFR calc Af Amer: >90 mL/min (ref >90)
GFR calc non Af Amer: >90 mL/min (ref >90)
GLUCOSE: 111 mg/dL — AB (ref 70–99)
Potassium: 3.5 mmol/L (ref 3.5–5.1)
Sodium: 143 mmol/L (ref 135–145)

## 2014-04-12 MED ORDER — ONDANSETRON HCL 4 MG PO TABS: 4.0000 mg | ORAL_TABLET | ORAL | Status: DC | PRN

## 2014-04-12 NOTE — Progress Notes (Signed)
Subjective: 2 Days Post-Op Procedure(s) (LRB): LEFT TOTAL HIP ARTHROPLASTY ANTERIOR APPROACH (Left) Patient reports pain as mild and moderate.   Patient seen in rounds for Dr. Wynelle Link.  Requiring Zofran with pain meds. Patient is well, but has had some minor complaints of pain in the hip, requiring pain medications Patient is ready to go home today  Objective: Vital signs in last 24 hours: Temp:  [98.3 F (36.8 C)-100.1 F (37.8 C)] 100.1 F (37.8 C) (02/17 0459) Pulse Rate:  [68-96] 84 (02/17 0459) Resp:  [16-18] 16 (02/17 0800) BP: (110-145)/(56-66) 136/56 mmHg (02/17 0459) SpO2:  [95 %-99 %] 95 % (02/17 0459)  Intake/Output from previous day:  Intake/Output Summary (Last 24 hours) at 04/12/14 1054 Last data filed at 04/12/14 0900  Gross per 24 hour  Intake   1080 ml  Output   2600 ml  Net  -1520 ml    Intake/Output this shift: Total I/O In: 240 [P.O.:240] Out: 500 [Urine:500]  Labs:  Recent Labs  04/11/14 0451 04/12/14 0448  HGB 10.7* 10.3*    Recent Labs  04/11/14 0451 04/12/14 0448  WBC 8.5 11.2*  RBC 3.67* 3.52*  HCT 33.3* 32.1*  PLT 187 198    Recent Labs  04/11/14 0451 04/12/14 0448  NA 141 143  K 3.5 3.5  CL 111 111  CO2 25 26  BUN 8 8  CREATININE 0.67 0.55  GLUCOSE 178* 111*  CALCIUM 9.2 9.0   No results for input(s): LABPT, INR in the last 72 hours.  EXAM: General - Patient is Alert, Appropriate and Oriented Extremity - Neurovascular intact Sensation intact distally Dorsiflexion/Plantar flexion intact Incision - clean, dry, no drainage Motor Function - intact, moving foot and toes well on exam.   Assessment/Plan: 2 Days Post-Op Procedure(s) (LRB): LEFT TOTAL HIP ARTHROPLASTY ANTERIOR APPROACH (Left) Procedure(s) (LRB): LEFT TOTAL HIP ARTHROPLASTY ANTERIOR APPROACH (Left) Past Medical History  Diagnosis Date  . Mitral valve prolapse   . Hypothyroidism   . Anxiety   . H/O hiatal hernia   . History of melanoma    melanoma- 1986 and 1997   . PTSD (post-traumatic stress disorder) 2006  . Corneal dystrophy   . Dysphagia     chronic- please see ultrasound done 4/16 15 in [facility]   . Hearing loss     bilateral due to nerve loss. wears hearing aides  . Family history of adverse reaction to anesthesia     mother slow to wake up   . Dysrhythmia     sometimes patient has extra beats per patient   . Heart murmur     no problem   . Nocturia   . Arthritis     arthritis in hip   . Peripheral neuropathy     feet  . GERD (gastroesophageal reflux disease)     under control  . Esophageal dysmotility   . Low back pain with sciatica   . DDD (degenerative disc disease), cervical     and lower back  . Glaucoma     "suspect"  . Cataract     beginning  . Varicose veins   . Spider veins   . Diverticulosis   . Osteoporosis   . Urinary incontinence     occasional  . Gout     one finger x1 attack  . Measles     hx of  . History of chicken pox   . Acne rosacea   . Complication of anesthesia     "really  sore throat"  . PONV (postoperative nausea and vomiting)   . RSD (reflex sympathetic dystrophy) 1993   Principal Problem:   Avascular necrosis of femur head, left Active Problems:   OA (osteoarthritis) of hip  Estimated body mass index is 26.48 kg/(m^2) as calculated from the following:   Height as of this encounter: 5\' 6"  (1.676 m).   Weight as of this encounter: 74.39 kg (164 lb). Up with therapy Diet - Regular diet Follow up - in 2 weeks Activity - WBAT Disposition - Home Condition Upon Discharge - Good D/C Meds - See DC Summary DVT Prophylaxis - Xarelto  Arlee Muslim, PA-C Orthopaedic Surgery 04/12/2014, 10:54 AM

## 2014-04-12 NOTE — Progress Notes (Signed)
Pt to d/c home with [facility]. No DME needs. AVS reviewed and "My Chart" discussed with pt. Pt capable of verbalizing medications, dressing changes, signs and symptoms of infection, and follow-up appointments. Remains hemodynamically stable. No signs and symptoms of distress. Educated pt to return to ER in the case of SOB, dizziness, or chest pain.

## 2014-04-12 NOTE — Progress Notes (Signed)
Physical Therapy Treatment Patient Details Name: Brooke Jimenez MRN: [id number] DOB: [birth year] Today's Date: 04/12/2014    History of Present Illness [name]    PT Comments    Patient reports that she had an increase ion pain last PM. After  practicing steps pt c/o feeling dizzy-" like i will pass out." Chair brought up. BP 133/94. sats 100, HR 62. Will allow rest and lunch and practice walking again.  Follow Up Recommendations  Home health PT;Supervision/Assistance - 24 hour     Equipment Recommendations  None recommended by PT    Recommendations for Other Services       Precautions / Restrictions Precautions Precautions: Fall    Mobility  Bed Mobility Overal bed mobility: Needs Assistance Bed Mobility: Sidelying to Sit Rolling: Min assist Sidelying to sit: Min assist Supine to sit: Min assist     General bed mobility comments: assist with legs off of the bed..and into the bed.  Transfers Overall transfer level: Needs assistance Equipment used: Rolling walker (2 wheeled) Transfers: Sit to/from Stand Sit to Stand: Min assist         General transfer comment: from bed and [facility]  Ambulation/Gait Ambulation/Gait assistance: Min assist Ambulation Distance (Feet): 50 Feet Assistive device: Rolling walker (2 wheeled) Gait Pattern/deviations: Step-through pattern;Step-to pattern;Antalgic;Decreased step length - left     General Gait Details: cues for sequence   Stairs Stairs: Yes Stairs assistance: Mod assist Stair Management: One rail Right;Two rails;One rail Left;Step to pattern;Backwards;Forwards Number of Stairs: 3 General stair comments: practiced forward and backward with the RW. with spouse present.  Wheelchair Mobility    Modified Rankin (Stroke Patients Only)       Balance                                    Cognition   Behavior During Therapy: Anxious                        Exercises      General Comments         Pertinent Vitals/Pain Pain Score: 6  Pain Location: L hip Pain Descriptors / Indicators: Aching Pain Intervention(s): Limited activity within patient's tolerance;Monitored during session;Premedicated before session;Ice applied    Home Living                      Prior Function            PT Goals (current goals can now be found in the care plan section) Progress towards PT goals: Progressing toward goals    Frequency       PT Plan Current plan remains appropriate    Co-evaluation             End of Session   Activity Tolerance: Patient limited by fatigue;Treatment limited secondary to medical complications (Comment) (pt c/o feeling like she was going to pass out, chair brought up.) Patient left: in bed     Time: 1159-1231 PT Time Calculation (min) (ACUTE ONLY): 32 min  Charges:  $Gait Training: 23-37 mins                    G Codes:      [name] 04/12/2014, 2:14 PM

## 2014-04-12 NOTE — Discharge Summary (Signed)
Physician Discharge Summary   Patient ID: Brooke Jimenez MRN: [id number] DOB/AGE: [birth year] 69 y.o.  Admit date: 04/10/2014 Discharge date: 04/12/2014  Primary Diagnosis:  Avascular necrosis of the Left hip.  Admission Diagnoses:  Past Medical History  Diagnosis Date  . Mitral valve prolapse   . Hypothyroidism   . Anxiety   . H/O hiatal hernia   . History of melanoma     melanoma- 1986 and 1997   . PTSD (post-traumatic stress disorder) 2006  . Corneal dystrophy   . Dysphagia     chronic- please see ultrasound done 4/16 15 in [facility]   . Hearing loss     bilateral due to nerve loss. wears hearing aides  . Family history of adverse reaction to anesthesia     mother slow to wake up   . Dysrhythmia     sometimes patient has extra beats per patient   . Heart murmur     no problem   . Nocturia   . Arthritis     arthritis in hip   . Peripheral neuropathy     feet  . GERD (gastroesophageal reflux disease)     under control  . Esophageal dysmotility   . Low back pain with sciatica   . DDD (degenerative disc disease), cervical     and lower back  . Glaucoma     "suspect"  . Cataract     beginning  . Varicose veins   . Spider veins   . Diverticulosis   . Osteoporosis   . Urinary incontinence     occasional  . Gout     one finger x1 attack  . Measles     hx of  . History of chicken pox   . Acne rosacea   . Complication of anesthesia     "really sore throat"  . PONV (postoperative nausea and vomiting)   . RSD (reflex sympathetic dystrophy) 1993   Discharge Diagnoses:   Principal Problem:   Avascular necrosis of femur head, left Active Problems:   OA (osteoarthritis) of hip  Estimated body mass index is 26.48 kg/(m^2) as calculated from the following:   Height as of this encounter: _0  (1.676 m).   Weight as of this encounter: 74.39 kg (164 lb).  Procedure(s) (LRB): LEFT TOTAL HIP ARTHROPLASTY ANTERIOR APPROACH (Left)   Consults: None  HPI: Brooke Jimenez is a 69 y.o. female who has advanced end-  stage arthritis of her Left hip with progressively worsening pain and  dysfunction.The patient has failed nonoperative management and presents for  total hip arthroplasty.   Laboratory Data: Admission on 04/10/2014, Discharged on 04/12/2014  Component Date Value Ref Range Status  . WBC 04/11/2014 8.5  4.0 - 10.5 K/uL Final  . RBC 04/11/2014 3.67* 3.87 - 5.11 MIL/uL Final  . Hemoglobin 04/11/2014 10.7* 12.0 - 15.0 g/dL Final  . HCT 04/11/2014 33.3* 36.0 - 46.0 % Final  . MCV 04/11/2014 90.7  78.0 - 100.0 fL Final  . MCH 04/11/2014 29.2  26.0 - 34.0 pg Final  . MCHC 04/11/2014 32.1  30.0 - 36.0 g/dL Final  . RDW 04/11/2014 13.9  11.5 - 15.5 % Final  . Platelets 04/11/2014 187  150 - 400 K/uL Final  . Sodium 04/11/2014 141  135 - 145 mmol/L Final  . Potassium 04/11/2014 3.5  3.5 - 5.1 mmol/L Final  . Chloride 04/11/2014 111  96 - 112 mmol/L Final  . CO2 04/11/2014 25  19 - 32 mmol/L  Final  . Glucose, Bld 04/11/2014 178* 70 - 99 mg/dL Final  . BUN 04/11/2014 8  6 - 23 mg/dL Final  . Creatinine, Ser 04/11/2014 0.67  0.50 - 1.10 mg/dL Final  . Calcium 04/11/2014 9.2  8.4 - 10.5 mg/dL Final  . GFR calc non Af Amer 04/11/2014 88* >90 mL/min Final  . GFR calc Af Amer 04/11/2014 >90  >90 mL/min Final   Comment: (NOTE) The eGFR has been calculated using the CKD EPI equation. This calculation has not been validated in all clinical situations. eGFR's persistently <90 mL/min signify possible Chronic Kidney Disease.   . Anion gap 04/11/2014 5  5 - 15 Final  . WBC 04/12/2014 11.2* 4.0 - 10.5 K/uL Final  . RBC 04/12/2014 3.52* 3.87 - 5.11 MIL/uL Final  . Hemoglobin 04/12/2014 10.3* 12.0 - 15.0 g/dL Final  . HCT 04/12/2014 32.1* 36.0 - 46.0 % Final  . MCV 04/12/2014 91.2  78.0 - 100.0 fL Final  . MCH 04/12/2014 29.3  26.0 - 34.0 pg Final  . MCHC 04/12/2014 32.1  30.0 - 36.0 g/dL Final  . RDW 04/12/2014 14.2  11.5 - 15.5 % Final  .  Platelets 04/12/2014 198  150 - 400 K/uL Final  . Sodium 04/12/2014 143  135 - 145 mmol/L Final  . Potassium 04/12/2014 3.5  3.5 - 5.1 mmol/L Final  . Chloride 04/12/2014 111  96 - 112 mmol/L Final  . CO2 04/12/2014 26  19 - 32 mmol/L Final  . Glucose, Bld 04/12/2014 111* 70 - 99 mg/dL Final  . BUN 04/12/2014 8  6 - 23 mg/dL Final  . Creatinine, Ser 04/12/2014 0.55  0.50 - 1.10 mg/dL Final  . Calcium 04/12/2014 9.0  8.4 - 10.5 mg/dL Final  . GFR calc non Af Amer 04/12/2014 >90  >90 mL/min Final  . GFR calc Af Amer 04/12/2014 >90  >90 mL/min Final   Comment: (NOTE) The eGFR has been calculated using the CKD EPI equation. This calculation has not been validated in all clinical situations. eGFR's persistently <90 mL/min signify possible Chronic Kidney Disease.   Georgiann Hahn gap 04/12/2014 6  5 - 15 Final  Hospital Outpatient Visit on 04/03/2014  Component Date Value Ref Range Status  . MRSA, PCR 04/03/2014 NEGATIVE  NEGATIVE Final  . Staphylococcus aureus 04/03/2014 NEGATIVE  NEGATIVE Final   Comment:        The Xpert SA Assay (FDA approved for NASAL specimens in patients over 15 years of age), is one component of a comprehensive surveillance program.  Test performance has been validated by [facility] for patients greater than or equal to 45 year old. It is not intended to diagnose infection nor to guide or monitor treatment.   Marland Kitchen aPTT 04/03/2014 27  24 - 37 seconds Final  . Prothrombin Time 04/03/2014 13.1  11.6 - 15.2 seconds Final  . INR 04/03/2014 0.98  0.00 - 1.49 Final  . Color, Urine 04/03/2014 YELLOW  YELLOW Final  . APPearance 04/03/2014 CLEAR  CLEAR Final  . Specific Gravity, Urine 04/03/2014 1.019  1.005 - 1.030 Final  . pH 04/03/2014 5.5  5.0 - 8.0 Final  . Glucose, UA 04/03/2014 NEGATIVE  NEGATIVE mg/dL Final  . Hgb urine dipstick 04/03/2014 NEGATIVE  NEGATIVE Final  . Bilirubin Urine 04/03/2014 NEGATIVE  NEGATIVE Final  . Ketones, ur 04/03/2014 NEGATIVE  NEGATIVE  mg/dL Final  . Protein, ur 04/03/2014 NEGATIVE  NEGATIVE mg/dL Final  . Urobilinogen, UA 04/03/2014 0.2  0.0 - 1.0  mg/dL Final  . Nitrite 04/03/2014 NEGATIVE  NEGATIVE Final  . Leukocytes, UA 04/03/2014 NEGATIVE  NEGATIVE Final   MICROSCOPIC NOT DONE ON URINES WITH NEGATIVE PROTEIN, BLOOD, LEUKOCYTES, NITRITE, OR GLUCOSE <1000 mg/dL.  . ABO/RH(D) 04/03/2014 O POS   Final  . Antibody Screen 04/03/2014 NEG   Final  . Sample Expiration 04/03/2014 04/13/2014   Final  . ABO/RH(D) 04/03/2014 O POS   Final     X-Rays:Dg Pelvis Portable  04/10/2014   CLINICAL DATA:  Hip arthroplasty  EXAM: DG C-ARM 1-60 MIN - NRPT [facility]; PORTABLE PELVIS 1-2 VIEWS  COMPARISON:  Portable exam 1641 hours without priors for comparison.  FLUOROSCOPY TIME:  Radiation Exposure Index (as provided by the fluoroscopic device): Not provided  If the device does not provide the exposure index:  Fluoroscopy Time:  0 minutes 12 seconds  Number of Acquired Images:  None  FINDINGS: Diffuse osseous demineralization.  Acetabular and femoral components of a LEFT hip prosthesis are identified.  No acute fracture or dislocation.  SI joints and RIGHT hip joint spaces preserved.  Pelvis intact.  Postsurgical changes of the soft tissues in LEFT hip region with note of a surgical drain.  IMPRESSION: LEFT hip prosthesis and osseous demineralization without acute abnormalities.   Electronically Signed   By: Lavonia Dana M.D.   On: 04/10/2014 17:40   Dg C-arm 1-60 Min-no Report  04/10/2014   CLINICAL DATA:  Hip arthroplasty  EXAM: DG C-ARM 1-60 MIN - NRPT [facility]; PORTABLE PELVIS 1-2 VIEWS  COMPARISON:  Portable exam 1641 hours without priors for comparison.  FLUOROSCOPY TIME:  Radiation Exposure Index (as provided by the fluoroscopic device): Not provided  If the device does not provide the exposure index:  Fluoroscopy Time:  0 minutes 12 seconds  Number of Acquired Images:  None  FINDINGS: Diffuse osseous demineralization.  Acetabular and femoral  components of a LEFT hip prosthesis are identified.  No acute fracture or dislocation.  SI joints and RIGHT hip joint spaces preserved.  Pelvis intact.  Postsurgical changes of the soft tissues in LEFT hip region with note of a surgical drain.  IMPRESSION: LEFT hip prosthesis and osseous demineralization without acute abnormalities.   Electronically Signed   By: Lavonia Dana M.D.   On: 04/10/2014 17:40    EKG: Orders placed or performed during the hospital encounter of 10/17/13  . EKG 12-Lead  . EKG 12-Lead     Hospital Course: Patient was admitted to [facility] and taken to the OR and underwent the above state procedure without complications.  Patient tolerated the procedure well and was later transferred to the recovery room and then to the orthopaedic floor for postoperative care.  They were given PO and IV analgesics for pain control following their surgery.  They were given 24 hours of postoperative antibiotics of  Anti-infectives    Start     Dose/Rate Route Frequency Ordered Stop   04/11/14 1000  minocycline (MINOCIN,DYNACIN) capsule 50 mg  Status:  Discontinued     50 mg Oral 2 times daily 04/11/14 0815 04/12/14 1732   04/11/14 0600  vancomycin (VANCOCIN) IVPB 1000 mg/200 mL premix     1,000 mg 200 mL/hr over 60 Minutes Intravenous On call to O.R. 04/10/14 1421 04/10/14 1524   04/11/14 0200  vancomycin (VANCOCIN) IVPB 1000 mg/200 mL premix     1,000 mg 200 mL/hr over 60 Minutes Intravenous Every 12 hours 04/10/14 1750 04/11/14 0240     and started on DVT  prophylaxis in the form of Xarelto.   PT and OT were ordered for total hip protocol.  The patient was allowed to be WBAT with therapy. Discharge planning was consulted to help with postop disposition and equipment needs.  Patient had a decent night on the evening of surgery.  They started to get up OOB with therapy on day one.  Hemovac drain was pulled without difficulty.  Continued to work with therapy into day two.  Dressing  was changed on day two and the incision was healing well.  Patient was seen in rounds and was ready to go home.  Diet - Regular diet Follow up - in 2 weeks Activity - WBAT Disposition - Home Condition Upon Discharge - Good D/C Meds - See DC Summary DVT Prophylaxis - Xarelto      Discharge Instructions    Call MD / Call 911    Complete by:  As directed   If you experience chest pain or shortness of breath, CALL 911 and be transported to the hospital emergency room.  If you develope a fever above 101 F, pus (white drainage) or increased drainage or redness at the wound, or calf pain, call your surgeon's office.     Change dressing    Complete by:  As directed   You may change your dressing dressing daily with sterile 4 x 4 inch gauze dressing and paper tape.  Do not submerge the incision under water.     Constipation Prevention    Complete by:  As directed   Drink plenty of fluids.  Prune juice may be helpful.  You may use a stool softener, such as Colace (over the counter) 100 mg twice a day.  Use MiraLax (over the counter) for constipation as needed.     Diet - low sodium heart healthy    Complete by:  As directed      Discharge instructions    Complete by:  As directed   Pick up stool softner and laxative for home use following surgery while on pain medications. Do not submerge incision under water. Please use good hand washing techniques while changing dressing each day. May shower starting three days after surgery. Please use a clean towel to pat the incision dry following showers. Continue to use ice for pain and swelling after surgery. Do not use any lotions or creams on the incision until instructed by your surgeon.  Total Hip Protocol.  Take Xarelto for two and a half more weeks, then discontinue Xarelto. Once the patient has completed the blood thinner regimen, then take a Baby 81 mg Aspirin daily for three more weeks.  Postoperative Constipation Protocol  Constipation  - defined medically as fewer than three stools per week and severe constipation as less than one stool per week.  One of the most common issues patients have following surgery is constipation.  Even if you have a regular bowel pattern at home, your normal regimen is likely to be disrupted due to multiple reasons following surgery.  Combination of anesthesia, postoperative narcotics, change in appetite and fluid intake all can affect your bowels.  In order to avoid complications following surgery, here are some recommendations in order to help you during your recovery period.  Colace (docusate) - Pick up an over-the-counter form of Colace or another stool softener and take twice a day as long as you are requiring postoperative pain medications.  Take with a full glass of water daily.  If you experience loose stools or  diarrhea, hold the colace until you stool forms back up.  If your symptoms do not get better within 1 week or if they get worse, check with your doctor.  Dulcolax (bisacodyl) - Pick up over-the-counter and take as directed by the product packaging as needed to assist with the movement of your bowels.  Take with a full glass of water.  Use this product as needed if not relieved by Colace only.   MiraLax (polyethylene glycol) - Pick up over-the-counter to have on hand.  MiraLax is a solution that will increase the amount of water in your bowels to assist with bowel movements.  Take as directed and can mix with a glass of water, juice, soda, coffee, or tea.  Take if you go more than two days without a movement. Do not use MiraLax more than once per day. Call your doctor if you are still constipated or irregular after using this medication for 7 days in a row.  If you continue to have problems with postoperative constipation, please contact the office for further assistance and recommendations.  If you experience "the worst abdominal pain ever" or develop nausea or vomiting, please contact the  office immediatly for further recommendations for treatment.     Do not sit on low chairs, stoools or toilet seats, as it may be difficult to get up from low surfaces    Complete by:  As directed      Driving restrictions    Complete by:  As directed   No driving until released by the physician.     Increase activity slowly as tolerated    Complete by:  As directed      Lifting restrictions    Complete by:  As directed   No lifting until released by the physician.     Patient may shower    Complete by:  As directed   You may shower without a dressing once there is no drainage.  Do not wash over the wound.  If drainage remains, do not shower until drainage stops.     TED hose    Complete by:  As directed   Use stockings (TED hose) for 3 weeks on both leg(s).  You may remove them at night for sleeping.     Weight bearing as tolerated    Complete by:  As directed   Laterality:  left  Extremity:  Lower            Medication List    STOP taking these medications        b complex vitamins tablet     calcium carbonate 500 MG chewable tablet  Commonly known as:  TUMS - dosed in mg elemental calcium     CHEWABLES MULTIVITAMIN Chew     ibuprofen 200 MG tablet  Commonly known as:  ADVIL,MOTRIN     oxyCODONE 5 MG immediate release tablet  Commonly known as:  Oxy IR/ROXICODONE      TAKE these medications        acetaminophen 500 MG tablet  Commonly known as:  TYLENOL  Take 500 mg by mouth every 6 (six) hours as needed for mild pain.     clindamycin 150 MG capsule  Commonly known as:  CLEOCIN  Take 600 mg by mouth. One hour before dental appointments     COLACE PO  Take 1 tablet by mouth 3 (three) times daily as needed.     HYDROmorphone 2 MG tablet  Commonly known as:  DILAUDID  Take 1-2 tablets (2-4 mg total) by mouth every 4 (four) hours as needed for moderate pain or severe pain.     levothyroxine 50 MCG tablet  Commonly known as:  SYNTHROID, LEVOTHROID  Take 50  mcg by mouth daily before breakfast.     Magnesium 250 MG Tabs  Take 125 mg by mouth daily.     methocarbamol 500 MG tablet  Commonly known as:  ROBAXIN  Take 1 tablet (500 mg total) by mouth every 6 (six) hours as needed for muscle spasms.     minocycline 50 MG capsule  Commonly known as:  MINOCIN,DYNACIN  Take 50 mg by mouth 2 (two) times daily.     MURO 128 5 % ophthalmic ointment  Generic drug:  sodium chloride  Place 1 application into both eyes at bedtime.     ondansetron 4 MG tablet  Commonly known as:  ZOFRAN  Take 1 tablet (4 mg total) by mouth every 4 (four) hours as needed for nausea.     rivaroxaban 10 MG Tabs tablet  Commonly known as:  XARELTO  - Take 1 tablet (10 mg total) by mouth daily with breakfast. Take Xarelto for two and a half more weeks, then discontinue Xarelto.  - Once the patient has completed the blood thinner regimen, then take a Baby 81 mg Aspirin daily for three more weeks.     traMADol 50 MG tablet  Commonly known as:  ULTRAM  Take 1-2 tablets (50-100 mg total) by mouth every 6 (six) hours as needed (mild pain).       Follow-up Information    Follow up with Gearlean Alf, MD. Schedule an appointment as soon as possible for a visit on 04/25/2014.   Specialty:  Orthopedic Surgery   Why:  Call office at [phone] to set up appointment with Dr. Denman George information:   [address] [place] [address] [phone]       Follow up with [facility].   Why:  home health physical therapy   Contact information:   [address] [place] [address] [phone]       Signed: Arlee Muslim, PA-C Orthopaedic Surgery 04/26/2014, 9:23 PM

## 2014-04-12 NOTE — Care Management Note (Signed)
    Page 1 of 2   04/12/2014     11:57:00 AM CARE MANAGEMENT NOTE 04/12/2014  Patient:  Brooke Jimenez,Brooke Jimenez   Account Number:  [id number]  Date Initiated:  04/11/2014  Documentation initiated by:  DAVIS,RHONDA  Subjective/Objective Assessment:   Left total hip arthroplasty, anterior approach.     Action/Plan:   hhc   Anticipated DC Date:  04/12/2014   Anticipated DC Plan:  [place]  In-house referral  NA      DC Planning Services  CM consult      [facility] Choice  NA   Choice offered to / List presented to:  C-1 Patient   DME arranged  NA      DME agency  NA     [place] arranged  HH-2 PT      [place]   Status of service:  Completed, signed off Medicare Important Message given?   (If response is "NO", the following Medicare IM given date fields will be blank) Date Medicare IM given:   Medicare IM given by:   Date Additional Medicare IM given:   Additional Medicare IM given by:    Discharge Disposition:  [place]  Per UR Regulation:  Reviewed for med. necessity/level of care/duration of stay  If discussed at [place] of Stay Meetings, dates discussed:    Comments:  04/12/14 09:15 CM met with pt in room to offer choice of home health agency. Pt chooses [facility] to render HHPT.  Pt has RW at home and does not need 3n1.  Address and contact information verified by pt.  Referral emailed to [employer], Tim.  No other CM needs were communicated.  Mariane Masters, BSN, CM [phone].  Rhonda Davis,RN,BSN,CCM

## 2014-04-12 NOTE — Progress Notes (Signed)
Physical Therapy Treatment Patient Details Name: Brooke Jimenez MRN: [id number] DOB: 01-05-1946 Today's Date: 04/12/2014    History of Present Illness Brooke Jimenez    PT Comments    Patient feeling better, not dizzy, moving much better. Ready for DC.  Follow Up Recommendations  Home health PT;Supervision/Assistance - 24 hour     Equipment Recommendations  None recommended by PT    Recommendations for Other Services       Precautions / Restrictions Precautions Precautions: Fall    Mobility  Bed Mobility Overal bed mobility: Needs Assistance Bed Mobility: Sidelying to Sit Rolling: Min assist Sidelying to sit: Min assist Supine to sit: Min assist     General bed mobility comments: assist with legs off of the bed.  Transfers Overall transfer level: Needs assistance Equipment used: Rolling walker (2 wheeled) Transfers: Sit to/from Stand Sit to Stand: Min assist         General transfer comment: from bed and [facility]  Ambulation/Gait Ambulation/Gait assistance: Min assist Ambulation Distance (Feet): 20 Feet (x2) Assistive device: Rolling walker (2 wheeled) Gait Pattern/deviations: Step-through pattern     General Gait Details: cues for sequence   Stairs Stairs: Yes Stairs assistance: Mod assist Stair Management: One rail Right;Two rails;One rail Left;Step to pattern;Backwards;Forwards Number of Stairs: 3 General stair comments: practiced forward and backward with the RW. with spouse present.  Wheelchair Mobility    Modified Rankin (Stroke Patients Only)       Balance                                    Cognition   Behavior During Therapy: Anxious                        Exercises      General Comments        Pertinent Vitals/Pain Pain Score: 6  Pain Location: Brooke hip Pain Descriptors / Indicators: Aching Pain Intervention(s): Limited activity within patient's tolerance;Monitored during session;Premedicated before  session;Ice applied    Home Living                      Prior Function            PT Goals (current goals can now be found in the care plan section) Progress towards PT goals: Progressing toward goals    Frequency       PT Plan Current plan remains appropriate    Co-evaluation             End of Session   Activity Tolerance: Patient tolerated treatment well Patient left: in chair;with family/visitor present;with nursing/sitter in room     Time: 9977-4142 PT Time Calculation (min) (ACUTE ONLY): 13 min  Charges:  $Gait Training: 8-22 mins                    G Codes:      Brooke Jimenez 04/12/2014, 2:20 PM

## 2014-04-12 NOTE — Progress Notes (Signed)
Occupational Therapy Treatment Patient Details Name: Brooke Jimenez MRN: [id number] DOB: 1946-01-22 Today's Date: 04/12/2014    History of present illness [name]   OT comments  Pt was fatigued after practicing 3in1 transfer, bathing, dressing tasks and use of AE. Discussed importance of taking plenty of rest breaks and breaking up tasks into smaller steps. Feel pt will do fine at home with spouse assist and increased time to complete tasks. Pt pleased with AE practice for this session. Educated further on where to obtain AE. Pt supposed to d/c today.   Follow Up Recommendations  No OT follow up;Supervision/Assistance - 24 hour    Equipment Recommendations  None recommended by OT    Recommendations for Other Services      Precautions / Restrictions Precautions Precautions: Fall Restrictions Weight Bearing Restrictions: No       Mobility Bed Mobility                  Transfers Overall transfer level: Needs assistance Equipment used: Rolling walker (2 wheeled) Transfers: Sit to/from Stand Sit to Stand: Min guard         General transfer comment: verbal cues for hand placement.    Balance                                   ADL                           Toilet Transfer: Min guard;Ambulation;BSC;RW   Toileting- Water quality scientist and Hygiene: Min guard;Sit to/from stand         General ADL Comments: Pt was fatigued after practicing 3in1 transfer and bathing/dressing so pt declined practicing stepping in and out of the shower. Demonstrated the technique for shower transfer and pt/spouse verbalize understanding of how to perform it when pt is ready to shower. Advised pt to wait a day or two extra to shower as she fatigues easily and is limited by hip, back and stomach discomfort/pain. Discussed at length how to arrange 3in1 in shower for safety and not have to bring in the walker into the shower due to space limitations. Pt used the  reacher to don pants and underwear and sock aid to don socks with min assist and mod cues. Feel pt will improve with practice with AE.       Vision                     Perception     Praxis      Cognition   Behavior During Therapy: WFL for tasks assessed/performed Overall Cognitive Status: Within Functional Limits for tasks assessed                       Extremity/Trunk Assessment               Exercises     Shoulder Instructions       General Comments      Pertinent Vitals/ Pain       Pain Assessment: 0-10 Pain Score: 6  Pain Location: L hip Pain Descriptors / Indicators: Aching Pain Intervention(s): Repositioned;Ice applied  Home Living  Prior Functioning/Environment              Frequency Min 2X/week     Progress Toward Goals  OT Goals(current goals can now be found in the care plan section)  Progress towards OT goals: Progressing toward goals     Plan Discharge plan remains appropriate    Co-evaluation                 End of Session Equipment Utilized During Treatment: Rolling walker   Activity Tolerance Patient limited by fatigue   Patient Left in chair;with call bell/phone within reach;with family/visitor present   Nurse Communication          Time: 1021-1173 OT Time Calculation (min): 53 min  Charges: OT General Charges $OT Visit: 1 Procedure OT Treatments $Self Care/Home Management : 23-37 mins $Therapeutic Activity: 23-37 mins  Jules Schick  [phone] 04/12/2014, 10:02 AM

## 2014-05-03 ENCOUNTER — Encounter: Payer: Self-pay | Admitting: Physical Therapy

## 2014-05-03 ENCOUNTER — Ambulatory Visit: Payer: Medicare Other | Attending: Orthopedic Surgery | Admitting: Physical Therapy

## 2014-05-03 DIAGNOSIS — R262 Difficulty in walking, not elsewhere classified: Secondary | ICD-10-CM | POA: Insufficient documentation

## 2014-05-03 DIAGNOSIS — M25551 Pain in right hip: Secondary | ICD-10-CM

## 2014-05-03 NOTE — Therapy (Signed)
[place], [place], [address] Phone: [phone]   Fax:  [phone]  Physical Therapy Evaluation  Patient Details  Name: Brooke Jimenez MRN: [id number] Date of Birth: [birth year] Referring Provider:  Gaynelle Arabian, MD  Encounter Date: 05/03/2014      PT End of Session - 05/03/14 1626    Visit Number 1   Number of Visits 12   Date for PT Re-Evaluation 07/02/14   PT Start Time 1524   PT Stop Time 1630   PT Time Calculation (min) 66 min      Past Medical History  Diagnosis Date  . Mitral valve prolapse   . Hypothyroidism   . Anxiety   . H/O hiatal hernia   . History of melanoma     melanoma- 1986 and 1997   . PTSD (post-traumatic stress disorder) 2006  . Corneal dystrophy   . Dysphagia     chronic- please see ultrasound done 4/16 15 in [facility]   . Hearing loss     bilateral due to nerve loss. wears hearing aides  . Family history of adverse reaction to anesthesia     mother slow to wake up   . Dysrhythmia     sometimes patient has extra beats per patient   . Heart murmur     no problem   . Nocturia   . Arthritis     arthritis in hip   . Peripheral neuropathy     feet  . GERD (gastroesophageal reflux disease)     under control  . Esophageal dysmotility   . Low back pain with sciatica   . DDD (degenerative disc disease), cervical     and lower back  . Glaucoma     "suspect"  . Cataract     beginning  . Varicose veins   . Spider veins   . Diverticulosis   . Osteoporosis   . Urinary incontinence     occasional  . Gout     one finger x1 attack  . Measles     hx of  . History of chicken pox   . Acne rosacea   . Complication of anesthesia     "really sore throat"  . PONV (postoperative nausea and vomiting)   . RSD (reflex sympathetic dystrophy) 1993    Past Surgical History  Procedure Laterality Date  . Melanoma surgery   1986, 1997  . Hardware removal Left  10/17/2013    Procedure: HARDWARE REMOVAL LEFT HIP;  Surgeon: Gearlean Alf, MD;  Location: [facility];  Service: Orthopedics;  Laterality: Left;  . Umbilical hernia repair N/A 02/08/2014    Procedure: HERNIA REPAIR UMBILICAL ADULT WITH MESH;  Surgeon: Jackolyn Confer, MD;  Location: [facility];  Service: General;  Laterality: N/A;  . Insertion of mesh  02/08/2014    Procedure: INSERTION OF MESH;  Surgeon: Jackolyn Confer, MD;  Location: [facility];  Service: General;;  . Hip fracture surgery Left 2006    3 screws  . Total hip arthroplasty Left 04/10/2014    Procedure: LEFT TOTAL HIP ARTHROPLASTY ANTERIOR APPROACH;  Surgeon: Gearlean Alf, MD;  Location: [facility];  Service: Orthopedics;  Laterality: Left;    There were no vitals taken for this visit.  Visit Diagnosis:  Right hip pain - Plan: PT plan of care cert/re-cert  Difficulty walking - Plan: PT plan of care cert/re-cert      Subjective Assessment - 05/03/14 1532  Symptoms Patient had a left THR on 03/10/14.  Reports that she has left hip pain, weakness and difficulty walking.  She had some home PT.  Reports some increased pain in the left hip with some of the home PT exercises.   Pertinent History HOH, lumbar DDD, had PT here after hardware removal last year in the left hip   Limitations Sitting;Standing;Walking;House hold activities   How long can you sit comfortably? 20   How long can you stand comfortably? 5   How long can you walk comfortably? 3   Patient Stated Goals walk without any assistive device, and go up stairs step over step and have no pain   Currently in Pain? Yes   Pain Score 5    Pain Location Hip   Pain Orientation Left   Pain Descriptors / Indicators Aching   Pain Type Surgical pain   Pain Onset 1 to 4 weeks ago   Pain Frequency Constant   Aggravating Factors  standing on the left leg pain will increase to 9/10   Pain Relieving Factors rest and gentle motions will help   Effect of Pain on Daily Activities limits  everything          [facility] PT Assessment - 05/03/14 0001    Assessment   Medical Diagnosis S/P left THR   Onset Date 03/10/14   Next MD Visit 05/16/14   Prior Therapy home PT   Precautions   Precautions Anterior Hip   Restrictions   Weight Bearing Restrictions No   Balance Screen   Has the patient fallen in the past 6 months No   Has the patient had a decrease in activity level because of a fear of falling?  No   Is the patient reluctant to leave their home because of a fear of falling?  No   Home Environment   Living Enviornment Private residence   Living Arrangements Spouse/significant other   Type of [place] to enter   Additional Comments has stairs at home   Prior Function   Level of [place] Independent with basic ADLs;Independent with homemaking with ambulation   Leisure was doing some exercises at Y   AROM   Right Hip Flexion --   Right Hip ABduction --   Left Hip Flexion 60   Left Hip ABduction 10   Strength   Overall Strength --  left hip strength 3+/5 with pain   Palpation   Palpation scar is tight and tender, tender along the thigh anteriorly and laterally   Transfers   Transfers Sit to Stand   Sit to Stand With upper extremity assist   Ambulation/Gait   Ambulation/Gait Yes   Ambulation/Gait Assistance 6: Modified independent (Device/Increase time)   Assistive device Straight cane   Gait Pattern Step-to pattern   Gait Comments trendelenberg on the left with stance phase, has fear when stepping onto the left leg   Timed Up and Go Test   Normal TUG (seconds) 25                  [facility] Adult PT Treatment/Exercise - 05/03/14 0001    Knee/Hip Exercises: Machines for Strengthening   Cybex Knee Extension 5#   Cybex Knee Flexion 20#   Cybex Leg Press no weight   Total Gym Leg Press NUSTEP Level 4 x 5 minutes                PT Education - 05/03/14 1626  Education provided Yes   Education Details educated on  very small bridges and supine marching   Person(s) Educated Patient   Methods Explanation;Demonstration   Comprehension Verbalized understanding          PT Short Term Goals - 05-20-2014 1645    PT SHORT TERM GOAL #1   Title independent with HEP   Time 2   Period Weeks   Status New           PT Long Term Goals - 2014/05/20 1645    PT LONG TERM GOAL #1   Title go up and down stairs step over step   Time 8   Period Weeks   Status New   PT LONG TERM GOAL #2   Title decrease pain 50%   Time 8   Period Weeks   Status New   PT LONG TERM GOAL #3   Title walk without assistive device for all distances   Time 8   Period Weeks   Status New   PT LONG TERM GOAL #4   Title increase ROM of the left hip flexion to 90 degrees while standing               Plan - 05-20-2014 1627    Clinical Impression Statement Patient with Left THR on 02/24/14 with an anterior approach.  She hsa a recent history of a hernia surgery and past back pain.  She would like to return to walking without assistive device and do all ADL's without difficulty   Pt will benefit from skilled therapeutic intervention in order to improve on the following deficits Abnormal gait;Decreased activity tolerance;Decreased balance;Decreased endurance;Decreased mobility;Decreased strength;Decreased range of motion;Difficulty walking;Impaired flexibility;Pain   Rehab Potential Good   PT Frequency 3x / week   PT Duration 4 weeks   PT Treatment/Interventions Electrical Stimulation;Moist Heat;Ultrasound;Gait training;Stair training;Therapeutic activities;Balance training;Therapeutic exercise;Patient/family education;Manual techniques   PT Next Visit Plan Add exercises, gait   Consulted and Agree with Plan of Care Patient          G-Codes - 05/20/2014 1649    Functional Assessment Tool Used FOTO   Functional Limitation Mobility: Walking and moving around   Mobility: Walking and Moving Around Current Status [phone]) At least  60 percent but less than 80 percent impaired, limited or restricted       Problem List Patient Active Problem List   Diagnosis Date Noted  . Avascular necrosis of femur head, left 04/10/2014  . OA (osteoarthritis) of hip 04/10/2014  . Painful orthopaedic hardware 10/17/2013    Sumner Boast, PT 2014/05/20, 5:01 PM  [place], [place], [address] Phone: [phone]   Fax:  [phone]

## 2014-05-08 ENCOUNTER — Ambulatory Visit: Payer: Medicare Other | Admitting: Physical Therapy

## 2014-05-08 ENCOUNTER — Encounter: Payer: Self-pay | Admitting: Physical Therapy

## 2014-05-08 DIAGNOSIS — M25551 Pain in right hip: Secondary | ICD-10-CM | POA: Diagnosis not present

## 2014-05-08 DIAGNOSIS — R262 Difficulty in walking, not elsewhere classified: Secondary | ICD-10-CM

## 2014-05-08 NOTE — Therapy (Signed)
[place], [place], [address] Phone: [phone]   Fax:  [phone]  Physical Therapy Treatment  Patient Details  Name: Brooke Jimenez MRN: [id number] Date of Birth: 09/09/1945 Referring Provider:  Gaynelle Arabian, MD  Encounter Date: 05/08/2014      PT End of Session - 05/08/14 1750    Visit Number 2   Number of Visits 12   Date for PT Re-Evaluation 07/02/14   PT Start Time 1702   PT Stop Time 1752   PT Time Calculation (min) 50 min      Past Medical History  Diagnosis Date  . Mitral valve prolapse   . Hypothyroidism   . Anxiety   . H/O hiatal hernia   . History of melanoma     melanoma- 1986 and 1997   . PTSD (post-traumatic stress disorder) 2006  . Corneal dystrophy   . Dysphagia     chronic- please see ultrasound done 4/16 15 in [facility]   . Hearing loss     bilateral due to nerve loss. wears hearing aides  . Family history of adverse reaction to anesthesia     mother slow to wake up   . Dysrhythmia     sometimes patient has extra beats per patient   . Heart murmur     no problem   . Nocturia   . Arthritis     arthritis in hip   . Peripheral neuropathy     feet  . GERD (gastroesophageal reflux disease)     under control  . Esophageal dysmotility   . Low back pain with sciatica   . DDD (degenerative disc disease), cervical     and lower back  . Glaucoma     "suspect"  . Cataract     beginning  . Varicose veins   . Spider veins   . Diverticulosis   . Osteoporosis   . Urinary incontinence     occasional  . Gout     one finger x1 attack  . Measles     hx of  . History of chicken pox   . Acne rosacea   . Complication of anesthesia     "really sore throat"  . PONV (postoperative nausea and vomiting)   . RSD (reflex sympathetic dystrophy) 1993    Past Surgical History  Procedure Laterality Date  . Melanoma surgery   1986, 1997  . Hardware removal Left  10/17/2013    Procedure: HARDWARE REMOVAL LEFT HIP;  Surgeon: Gearlean Alf, MD;  [facility]: [facility];  Service: Orthopedics;  Laterality: Left;  . Umbilical hernia repair N/A 02/08/2014    Procedure: HERNIA REPAIR UMBILICAL ADULT WITH MESH;  Surgeon: Jackolyn Confer, MD;  Location: [facility];  Service: General;  Laterality: N/A;  . Insertion of mesh  02/08/2014    Procedure: INSERTION OF MESH;  Surgeon: Jackolyn Confer, MD;  Location: [facility];  Service: General;;  . Hip fracture surgery Left 2006    3 screws  . Total hip arthroplasty Left 04/10/2014    Procedure: LEFT TOTAL HIP ARTHROPLASTY ANTERIOR APPROACH;  Surgeon: Gearlean Alf, MD;  [facility];  Service: Orthopedics;  Laterality: Left;    There were no vitals filed for this visit.  Visit Diagnosis:  Right hip pain  Difficulty walking      Subjective Assessment - 05/08/14 1713    Symptoms I was pretty active over the weekend and was really sore,  in the hip and in the groin   Pertinent History HOH, lumbar DDD, had PT here after hardware removal last year in the left hip   Limitations Sitting;Standing;Walking;House hold activities   Patient Stated Goals walk without any assistive device, and go up stairs step over step and have no pain                       [facility] Adult PT Treatment/Exercise - 05/08/14 0001    Bed Mobility   Bed Mobility --  worked with patient on mobility and problem solving   Knee/Hip Exercises: Hydrologist 20 seconds;4 reps   Piriformis Stretch 20 seconds;4 reps   Knee/Hip Exercises: Machines for Strengthening   Cybex Knee Extension 5#   Cybex Knee Flexion 25# 2x15   Total Gym Leg Press NUSTEP Level 6 x 5 minutes   Knee/Hip Exercises: Standing   Heel Raises 2 sets;15 reps   Knee Flexion 2 sets;15 reps  2# ankle weight   Hip ADduction 2 sets;10 reps  2# ankle weights                  PT Short Term Goals - 05/03/14 1645    PT SHORT TERM GOAL #1    Title independent with HEP   Time 2   Period Weeks   Status New           PT Long Term Goals - 05/03/14 1645    PT LONG TERM GOAL #1   Title go up and down stairs step over step   Time 8   Period Weeks   Status New   PT LONG TERM GOAL #2   Title decrease pain 50%   Time 8   Period Weeks   Status New   PT LONG TERM GOAL #3   Title walk without assistive device for all distances   Time 8   Period Weeks   Status New   PT LONG TERM GOAL #4   Title increase ROM of the left hip flexion to 90 degrees while standing               Plan - 05/08/14 1750    Clinical Impression Statement Had left THR anterior approach on 04/10/14.  She also had a recent hernia surgery.     Pt will benefit from skilled therapeutic intervention in order to improve on the following deficits Abnormal gait;Decreased activity tolerance;Decreased balance;Decreased endurance;Decreased mobility;Decreased strength;Decreased range of motion;Difficulty walking;Impaired flexibility;Pain   PT Frequency 3x / week   PT Duration 4 weeks   PT Treatment/Interventions Electrical Stimulation;Moist Heat;Ultrasound;Gait training;Stair training;Therapeutic activities;Balance training;Therapeutic exercise;Patient/family education;Manual techniques   PT Next Visit Plan Add exercises, gait   Consulted and Agree with Plan of Care Patient        Problem List Patient Active Problem List   Diagnosis Date Noted  . Avascular necrosis of femur head, left 04/10/2014  . OA (osteoarthritis) of hip 04/10/2014  . Painful orthopaedic hardware 10/17/2013    Sumner Boast, PT 05/08/2014, 5:53 PM  [place] [address] [place], [place], [address] Phone: [phone]   Fax:  [phone]

## 2014-05-10 ENCOUNTER — Ambulatory Visit: Payer: Medicare Other | Admitting: Physical Therapy

## 2014-05-10 ENCOUNTER — Encounter: Payer: Self-pay | Admitting: Physical Therapy

## 2014-05-10 DIAGNOSIS — R262 Difficulty in walking, not elsewhere classified: Secondary | ICD-10-CM

## 2014-05-10 DIAGNOSIS — M25551 Pain in right hip: Secondary | ICD-10-CM | POA: Diagnosis not present

## 2014-05-10 NOTE — Therapy (Signed)
[place], [place], [address] Phone: [phone]   Fax:  [phone]  Physical Therapy Treatment  Patient Details  Name: Brooke Jimenez MRN: [id number] Date of Birth: 1945/11/06 Referring Provider:  Gaynelle Arabian, MD  Encounter Date: 05/10/2014      PT End of Session - 05/10/14 5929    Visit Number 3   Number of Visits 12   Date for PT Re-Evaluation 07/02/14   PT Start Time 2446   PT Stop Time 1746   PT Time Calculation (min) 42 min      Past Medical History  Diagnosis Date  . Mitral valve prolapse   . Hypothyroidism   . Anxiety   . H/O hiatal hernia   . History of melanoma     melanoma- 1986 and 1997   . PTSD (post-traumatic stress disorder) 2006  . Corneal dystrophy   . Dysphagia     chronic- please see ultrasound done 4/16 15 in [facility]   . Hearing loss     bilateral due to nerve loss. wears hearing aides  . Family history of adverse reaction to anesthesia     mother slow to wake up   . Dysrhythmia     sometimes patient has extra beats per patient   . Heart murmur     no problem   . Nocturia   . Arthritis     arthritis in hip   . Peripheral neuropathy     feet  . GERD (gastroesophageal reflux disease)     under control  . Esophageal dysmotility   . Low back pain with sciatica   . DDD (degenerative disc disease), cervical     and lower back  . Glaucoma     "suspect"  . Cataract     beginning  . Varicose veins   . Spider veins   . Diverticulosis   . Osteoporosis   . Urinary incontinence     occasional  . Gout     one finger x1 attack  . Measles     hx of  . History of chicken pox   . Acne rosacea   . Complication of anesthesia     "really sore throat"  . PONV (postoperative nausea and vomiting)   . RSD (reflex sympathetic dystrophy) 1993    Past Surgical History  Procedure Laterality Date  . Melanoma surgery   1986, 1997  . Hardware removal Left  10/17/2013    Procedure: HARDWARE REMOVAL LEFT HIP;  Surgeon: Gearlean Alf, MD;  [facility]: [facility];  Service: Orthopedics;  Laterality: Left;  . Umbilical hernia repair N/A 02/08/2014    Procedure: HERNIA REPAIR UMBILICAL ADULT WITH MESH;  Surgeon: Jackolyn Confer, MD;  Location: [facility];  Service: General;  Laterality: N/A;  . Insertion of mesh  02/08/2014    Procedure: INSERTION OF MESH;  Surgeon: Jackolyn Confer, MD;  Location: [facility];  Service: General;;  . Hip fracture surgery Left 2006    3 screws  . Total hip arthroplasty Left 04/10/2014    Procedure: LEFT TOTAL HIP ARTHROPLASTY ANTERIOR APPROACH;  Surgeon: Gearlean Alf, MD;  [facility];  Service: Orthopedics;  Laterality: Left;    There were no vitals filed for this visit.  Visit Diagnosis:  Right hip pain  Difficulty walking      Subjective Assessment - 05/10/14 1707    Symptoms I was very sore after last treatment, left groin, back and  knee   Pertinent History HOH, lumbar DDD, had PT here after hardware removal last year in the left hip   Limitations Sitting;Standing;Walking;House hold activities   Patient Stated Goals walk without any assistive device, and go up stairs step over step and have no pain   Currently in Pain? Yes   Pain Score 3    Pain Location Hip   Pain Orientation Left   Pain Descriptors / Indicators Aching   Pain Type Surgical pain   Pain Onset 1 to 4 weeks ago                       [facility] Adult PT Treatment/Exercise - 05/10/14 0001    Knee/Hip Exercises: Machines for Strengthening   Cybex Knee Extension 5#   Cybex Knee Flexion 25# 2x15   Cybex Leg Press no weight   Total Gym Leg Press NUSTEP Level 5 x 5 minutes   Knee/Hip Exercises: Standing   Stairs reciprocally 1 flight, 4 " step ups fwd and side to side   Walking with Sports Cord black tband fwd    Gait Training side stepping   Other Standing Knee Exercises 2# hip abduction and marching   Other Standing Knee Exercises  sitting ball b/n knees isometric squeeze                  PT Short Term Goals - 05/03/14 1645    PT SHORT TERM GOAL #1   Title independent with HEP   Time 2   Period Weeks   Status New           PT Long Term Goals - 05/10/14 1749    PT LONG TERM GOAL #1   Title go up and down stairs step over step   Status Partially Met   PT LONG TERM GOAL #2   Title decrease pain 50%   Status On-going   PT LONG TERM GOAL #3   Title walk without assistive device for all distances   Status On-going               Plan - 05/10/14 1748    Clinical Impression Statement Patient was sore after last treatment, progress with strength and function, bigger steps, be careful with recent hernia surgery and back pain   Pt will benefit from skilled therapeutic intervention in order to improve on the following deficits Abnormal gait;Decreased activity tolerance;Decreased balance;Decreased endurance;Decreased mobility;Decreased strength;Decreased range of motion;Difficulty walking;Impaired flexibility;Pain   Rehab Potential Good   PT Frequency 3x / week   PT Duration 3 weeks   PT Treatment/Interventions Electrical Stimulation;Moist Heat;Ultrasound;Gait training;Stair training;Therapeutic activities;Balance training;Therapeutic exercise;Patient/family education;Manual techniques   PT Next Visit Plan Add exercises, gait   Consulted and Agree with Plan of Care Patient        Problem List Patient Active Problem List   Diagnosis Date Noted  . Avascular necrosis of femur head, left 04/10/2014  . OA (osteoarthritis) of hip 04/10/2014  . Painful orthopaedic hardware 10/17/2013    Sumner Boast, PT 05/10/2014, 5:50 PM  [place], [place], [address] Phone: [phone]   Fax:  [phone]

## 2014-05-15 ENCOUNTER — Ambulatory Visit: Payer: Medicare Other | Admitting: Physical Therapy

## 2014-05-15 DIAGNOSIS — M25551 Pain in right hip: Secondary | ICD-10-CM | POA: Diagnosis not present

## 2014-05-15 DIAGNOSIS — R262 Difficulty in walking, not elsewhere classified: Secondary | ICD-10-CM

## 2014-05-15 NOTE — Therapy (Signed)
[place], [place], [address] Phone: [phone]   Fax:  [phone]  Physical Therapy Treatment  Patient Details  Name: Brooke Jimenez MRN: [id number] Date of Birth: 1945-04-15 Referring Provider:  Gaynelle Arabian, MD  Encounter Date: 05/15/2014      PT End of Session - 05/15/14 1604    Visit Number 4   Number of Visits 12   Date for PT Re-Evaluation 07/02/14   PT Start Time 0323   PT Stop Time 0420   PT Time Calculation (min) 57 min      Past Medical History  Diagnosis Date  . Mitral valve prolapse   . Hypothyroidism   . Anxiety   . H/O hiatal hernia   . History of melanoma     melanoma- 1986 and 1997   . PTSD (post-traumatic stress disorder) 2006  . Corneal dystrophy   . Dysphagia     chronic- please see ultrasound done 4/16 15 in [facility]   . Hearing loss     bilateral due to nerve loss. wears hearing aides  . Family history of adverse reaction to anesthesia     mother slow to wake up   . Dysrhythmia     sometimes patient has extra beats per patient   . Heart murmur     no problem   . Nocturia   . Arthritis     arthritis in hip   . Peripheral neuropathy     feet  . GERD (gastroesophageal reflux disease)     under control  . Esophageal dysmotility   . Low back pain with sciatica   . DDD (degenerative disc disease), cervical     and lower back  . Glaucoma     "suspect"  . Cataract     beginning  . Varicose veins   . Spider veins   . Diverticulosis   . Osteoporosis   . Urinary incontinence     occasional  . Gout     one finger x1 attack  . Measles     hx of  . History of chicken pox   . Acne rosacea   . Complication of anesthesia     "really sore throat"  . PONV (postoperative nausea and vomiting)   . RSD (reflex sympathetic dystrophy) 1993    Past Surgical History  Procedure Laterality Date  . Melanoma surgery   1986, 1997  . Hardware removal Left  10/17/2013    Procedure: HARDWARE REMOVAL LEFT HIP;  Surgeon: Gearlean Alf, MD;  [facility]: [facility];  Service: Orthopedics;  Laterality: Left;  . Umbilical hernia repair N/A 02/08/2014    Procedure: HERNIA REPAIR UMBILICAL ADULT WITH MESH;  Surgeon: Jackolyn Confer, MD;  Location: [facility];  Service: General;  Laterality: N/A;  . Insertion of mesh  02/08/2014    Procedure: INSERTION OF MESH;  Surgeon: Jackolyn Confer, MD;  Location: [facility];  Service: General;;  . Hip fracture surgery Left 2006    3 screws  . Total hip arthroplasty Left 04/10/2014    Procedure: LEFT TOTAL HIP ARTHROPLASTY ANTERIOR APPROACH;  Surgeon: Gearlean Alf, MD;  [facility]: [facility];  Service: Orthopedics;  Laterality: Left;    There were no vitals filed for this visit.  Visit Diagnosis:  Right hip pain  Difficulty walking      Subjective Assessment - 05/15/14 1535    Symptoms Lt shin is bothering her with walking without cane.  Pertinent History HOH, lumbar DDD, had PT here after hardware removal last year in the left hip   Limitations Sitting;Standing;Walking;House hold activities   How long can you sit comfortably? 20   How long can you stand comfortably? 5   Currently in Pain? No/denies                       [facility] Adult PT Treatment/Exercise - 05/15/14 0001    Ambulation/Gait   Ambulation/Gait Yes   Ambulation/Gait Assistance 4: Min guard;5: Supervision   Ambulation/Gait Assistance Details 50 X 2   Gait Pattern Step-through pattern;Decreased trunk rotation  step lenght increased with eyes up   Exercises   Exercises Knee/Hip   Knee/Hip Exercises: Stretches   Active Hamstring Stretch 2 reps   Knee/Hip Exercises: Machines for Strengthening   Cybex Knee Extension 5#    Cybex Knee Flexion 25#   Knee/Hip Exercises: Standing   Forward Lunges Both;2 sets;10 reps  functional lunges short step   Side Lunges Both;2 sets;10 reps   Modalities   Modalities Moist Heat   Moist Heat Therapy    Number Minutes Moist Heat 15 Minutes   Moist Heat Location Other (comment)  lumbar                  PT Short Term Goals - 05/03/14 1645    PT SHORT TERM GOAL #1   Title independent with HEP   Time 2   Period Weeks   Status New           PT Long Term Goals - 05/10/14 1749    PT LONG TERM GOAL #1   Title go up and down stairs step over step   Status Partially Met   PT LONG TERM GOAL #2   Title decrease pain 50%   Status On-going   PT LONG TERM GOAL #3   Title walk without assistive device for all distances   Status On-going               Problem List Patient Active Problem List   Diagnosis Date Noted  . Avascular necrosis of femur head, left 04/10/2014  . OA (osteoarthritis) of hip 04/10/2014  . Painful orthopaedic hardware 10/17/2013     Natividad Brood, PTA  05/15/2014, 4:05 PM  [place], [place], [address] Phone: [phone]   Fax:  [phone]

## 2014-05-18 ENCOUNTER — Ambulatory Visit: Payer: Medicare Other | Admitting: Physical Therapy

## 2014-05-18 DIAGNOSIS — R262 Difficulty in walking, not elsewhere classified: Secondary | ICD-10-CM

## 2014-05-18 DIAGNOSIS — M25551 Pain in right hip: Secondary | ICD-10-CM | POA: Diagnosis not present

## 2014-05-18 NOTE — Therapy (Signed)
[place], [place], [address] Phone: [phone]   Fax:  [phone]  Physical Therapy Treatment  Patient Details  Name: Brooke Jimenez MRN: [id number] Date of Birth: [birth year] Referring Provider:  Gaynelle Arabian, MD  Encounter Date: 05/18/2014      PT End of Session - 05/18/14 1541    Visit Number 5   Number of Visits 12   Date for PT Re-Evaluation 07/02/14   PT Start Time 1503   PT Stop Time 1541   PT Time Calculation (min) 38 min   Activity Tolerance Patient tolerated treatment well   Behavior During Therapy [facility] for tasks assessed/performed      Past Medical History  Diagnosis Date  . Mitral valve prolapse   . Hypothyroidism   . Anxiety   . H/O hiatal hernia   . History of melanoma     melanoma- 1986 and 1997   . PTSD (post-traumatic stress disorder) 2006  . Corneal dystrophy   . Dysphagia     chronic- please see ultrasound done 4/16 15 in [facility]   . Hearing loss     bilateral due to nerve loss. wears hearing aides  . Family history of adverse reaction to anesthesia     mother slow to wake up   . Dysrhythmia     sometimes patient has extra beats per patient   . Heart murmur     no problem   . Nocturia   . Arthritis     arthritis in hip   . Peripheral neuropathy     feet  . GERD (gastroesophageal reflux disease)     under control  . Esophageal dysmotility   . Low back pain with sciatica   . DDD (degenerative disc disease), cervical     and lower back  . Glaucoma     "suspect"  . Cataract     beginning  . Varicose veins   . Spider veins   . Diverticulosis   . Osteoporosis   . Urinary incontinence     occasional  . Gout     one finger x1 attack  . Measles     hx of  . History of chicken pox   . Acne rosacea   . Complication of anesthesia     "really sore throat"  . PONV (postoperative nausea and vomiting)   . RSD (reflex sympathetic dystrophy) 1993     Past Surgical History  Procedure Laterality Date  . Melanoma surgery   1986, 1997  . Hardware removal Left 10/17/2013    Procedure: HARDWARE REMOVAL LEFT HIP;  Surgeon: Gearlean Alf, MD;  [facility];  Service: Orthopedics;  Laterality: Left;  . Umbilical hernia repair N/A 02/08/2014    Procedure: HERNIA REPAIR UMBILICAL ADULT WITH MESH;  Surgeon: Jackolyn Confer, MD;  [facility];  Service: General;  Laterality: N/A;  . Insertion of mesh  02/08/2014    Procedure: INSERTION OF MESH;  Surgeon: Jackolyn Confer, MD;  [facility];  Service: General;;  . Hip fracture surgery Left 2006    3 screws  . Total hip arthroplasty Left 04/10/2014    Procedure: LEFT TOTAL HIP ARTHROPLASTY ANTERIOR APPROACH;  Surgeon: Gearlean Alf, MD;  [facility];  Service: Orthopedics;  Laterality: Left;    There were no vitals filed for this visit.  Visit Diagnosis:  Right hip pain  Difficulty walking      Subjective Assessment -  05/18/14 1509    Symptoms Saw Alusio on Tues; MD pleased with progress so far   Pertinent History HOH, lumbar DDD, had PT here after hardware removal last year in the left hip   Limitations Sitting;Standing;Walking;House hold activities   Currently in Pain? Yes   Pain Score 3    Pain Location Hip  and knee   Pain Orientation Left   Pain Descriptors / Indicators Aching                       [facility] Adult PT Treatment/Exercise - 05/18/14 1510    Knee/Hip Exercises: Aerobic   Stationary Bike NuStep Level 6 x 6 min   Knee/Hip Exercises: Machines for Strengthening   Cybex Knee Extension 10# 2x10   Cybex Knee Flexion 25# 2x10   Cybex Leg Press Plate 1; 7O47   Knee/Hip Exercises: Standing   SLS marching with min A   Other Standing Knee Exercises hip flexion, abdct, ext 2x10 with 2#   Other Standing Knee Exercises resistive walking with red theraband laterally/backwards                  PT Short Term Goals - 05/03/14 1645     PT SHORT TERM GOAL #1   Title independent with HEP   Time 2   Period Weeks   Status New           PT Long Term Goals - 05/10/14 1749    PT LONG TERM GOAL #1   Title go up and down stairs step over step   Status Partially Met   PT LONG TERM GOAL #2   Title decrease pain 50%   Status On-going   PT LONG TERM GOAL #3   Title walk without assistive device for all distances   Status On-going               Plan - 05/18/14 1541    Clinical Impression Statement Pt tolerated session well, reports fatigue after exercises.  Declined modalities.   PT Next Visit Plan progress strengthening and gait        Problem List Patient Active Problem List   Diagnosis Date Noted  . Avascular necrosis of femur head, left 04/10/2014  . OA (osteoarthritis) of hip 04/10/2014  . Painful orthopaedic hardware 10/17/2013   Laureen Abrahams, PT, DPT 05/18/2014 3:42 PM  [place], [place], [address] Phone: [phone]   Fax:  [phone]

## 2014-05-22 ENCOUNTER — Encounter: Payer: Self-pay | Admitting: Physical Therapy

## 2014-05-22 ENCOUNTER — Ambulatory Visit: Payer: Medicare Other | Admitting: Physical Therapy

## 2014-05-22 DIAGNOSIS — M25551 Pain in right hip: Secondary | ICD-10-CM | POA: Diagnosis not present

## 2014-05-22 DIAGNOSIS — R262 Difficulty in walking, not elsewhere classified: Secondary | ICD-10-CM

## 2014-05-22 NOTE — Therapy (Signed)
[place], [place], [address] Phone: [phone]   Fax:  [phone]  Physical Therapy Treatment  Patient Details  Name: Brooke Jimenez MRN: [id number] Date of Birth: 11-27-1945 Referring Provider:  Gaynelle Arabian, MD  Encounter Date: 05/22/2014      PT End of Session - 05/22/14 1622    Visit Number 6   Number of Visits 12   Date for PT Re-Evaluation 07/02/14   PT Start Time 1532   PT Stop Time 5093   PT Time Calculation (min) 52 min      Past Medical History  Diagnosis Date  . Mitral valve prolapse   . Hypothyroidism   . Anxiety   . H/O hiatal hernia   . History of melanoma     melanoma- 1986 and 1997   . PTSD (post-traumatic stress disorder) 2006  . Corneal dystrophy   . Dysphagia     chronic- please see ultrasound done 4/16 15 in [facility]   . Hearing loss     bilateral due to nerve loss. wears hearing aides  . Family history of adverse reaction to anesthesia     mother slow to wake up   . Dysrhythmia     sometimes patient has extra beats per patient   . Heart murmur     no problem   . Nocturia   . Arthritis     arthritis in hip   . Peripheral neuropathy     feet  . GERD (gastroesophageal reflux disease)     under control  . Esophageal dysmotility   . Low back pain with sciatica   . DDD (degenerative disc disease), cervical     and lower back  . Glaucoma     "suspect"  . Cataract     beginning  . Varicose veins   . Spider veins   . Diverticulosis   . Osteoporosis   . Urinary incontinence     occasional  . Gout     one finger x1 attack  . Measles     hx of  . History of chicken pox   . Acne rosacea   . Complication of anesthesia     "really sore throat"  . PONV (postoperative nausea and vomiting)   . RSD (reflex sympathetic dystrophy) 1993    Past Surgical History  Procedure Laterality Date  . Melanoma surgery   1986, 1997  . Hardware removal Left  10/17/2013    Procedure: HARDWARE REMOVAL LEFT HIP;  Surgeon: Gearlean Alf, MD;  [facility];  Service: Orthopedics;  Laterality: Left;  . Umbilical hernia repair N/A 02/08/2014    Procedure: HERNIA REPAIR UMBILICAL ADULT WITH MESH;  Surgeon: Jackolyn Confer, MD;  [facility]: [facility];  Service: General;  Laterality: N/A;  . Insertion of mesh  02/08/2014    Procedure: INSERTION OF MESH;  Surgeon: Jackolyn Confer, MD;  [facility];  Service: General;;  . Hip fracture surgery Left 2006    3 screws  . Total hip arthroplasty Left 04/10/2014    Procedure: LEFT TOTAL HIP ARTHROPLASTY ANTERIOR APPROACH;  Surgeon: Gearlean Alf, MD;  [facility];  Service: Orthopedics;  Laterality: Left;    There were no vitals filed for this visit.  Visit Diagnosis:  Right hip pain  Difficulty walking      Subjective Assessment - 05/22/14 1541    Symptoms A little sore after last treatment, I am now driving  Pertinent History HOH, lumbar DDD, had PT here after hardware removal last year in the left hip   Limitations Sitting;Standing;Walking;House hold activities   Patient Stated Goals walk without any assistive device, and go up stairs step over step and have no pain   Currently in Pain? Yes   Pain Score 2    Pain Location Hip   Pain Orientation Left   Pain Descriptors / Indicators Aching   Pain Type Surgical pain   Pain Onset 1 to 4 weeks ago   Pain Frequency Intermittent   Aggravating Factors  sitting, standing and walking   Pain Relieving Factors rest                       [facility] Adult PT Treatment/Exercise - 05/22/14 0001    Ambulation/Gait   Ambulation/Gait Assistance Details 150 x 2   Gait Pattern Decreased stride length   Knee/Hip Exercises: Aerobic   Stationary Bike NuStep Level 6 x 6 min   Knee/Hip Exercises: Machines for Strengthening   Cybex Knee Extension 10# 2x10   Cybex Knee Flexion 25# 2x10   Cybex Leg Press 30# 2x10   Knee/Hip Exercises: Standing    SLS marching with min A   Other Standing Knee Exercises hip flexion, abdct, ext 2x10 with 2#   Other Standing Knee Exercises resisted gait all directions   Knee/Hip Exercises: Seated   Other Seated Knee Exercises ball b/n knees squeeze                  PT Short Term Goals - 05/03/14 1645    PT SHORT TERM GOAL #1   Title independent with HEP   Time 2   Period Weeks   Status New           PT Long Term Goals - 05/10/14 1749    PT LONG TERM GOAL #1   Title go up and down stairs step over step   Status Partially Met   PT LONG TERM GOAL #2   Title decrease pain 50%   Status On-going   PT LONG TERM GOAL #3   Title walk without assistive device for all distances   Status On-going               Plan - 05/22/14 1623    Clinical Impression Statement Still with weakness int he hips, she has difficulty maintaining normal gait as she tends to take a large step and a shuffling step, and is unsteady   Pt will benefit from skilled therapeutic intervention in order to improve on the following deficits Abnormal gait;Decreased activity tolerance;Decreased balance;Decreased endurance;Decreased mobility;Decreased strength;Decreased range of motion;Difficulty walking;Impaired flexibility;Pain   Rehab Potential Good   PT Frequency 3x / week   PT Duration 2 weeks   PT Treatment/Interventions Electrical Stimulation;Moist Heat;Ultrasound;Gait training;Stair training;Therapeutic activities;Balance training;Therapeutic exercise;Patient/family education;Manual techniques   PT Next Visit Plan progress strengthening and gait   Consulted and Agree with Plan of Care Patient        Problem List Patient Active Problem List   Diagnosis Date Noted  . Avascular necrosis of femur head, left 04/10/2014  . OA (osteoarthritis) of hip 04/10/2014  . Painful orthopaedic hardware 10/17/2013    Sumner Boast, PT 05/22/2014, 4:25 PM  [place], [place], [address] Phone: [phone]   Fax:  [phone]

## 2014-05-24 ENCOUNTER — Ambulatory Visit: Payer: Medicare Other | Admitting: Physical Therapy

## 2014-05-24 ENCOUNTER — Encounter: Payer: Self-pay | Admitting: Physical Therapy

## 2014-05-24 DIAGNOSIS — M25551 Pain in right hip: Secondary | ICD-10-CM

## 2014-05-24 DIAGNOSIS — R262 Difficulty in walking, not elsewhere classified: Secondary | ICD-10-CM

## 2014-05-24 NOTE — Therapy (Signed)
[place], [place], [address] Phone: [phone]   Fax:  [phone]  Physical Therapy Treatment  Patient Details  Name: Brooke Jimenez MRN: [id number] Date of Birth: 08/08/1945 Referring Provider:  Gaynelle Arabian, MD  Encounter Date: 05/24/2014      PT End of Session - 05/24/14 1619    Visit Number 7   Date for PT Re-Evaluation 07/02/14   PT Start Time 4270   PT Stop Time 1630   PT Time Calculation (min) 56 min      Past Medical History  Diagnosis Date  . Mitral valve prolapse   . Hypothyroidism   . Anxiety   . H/O hiatal hernia   . History of melanoma     melanoma- 1986 and 1997   . PTSD (post-traumatic stress disorder) 2006  . Corneal dystrophy   . Dysphagia     chronic- please see ultrasound done 4/16 15 in [facility]   . Hearing loss     bilateral due to nerve loss. wears hearing aides  . Family history of adverse reaction to anesthesia     mother slow to wake up   . Dysrhythmia     sometimes patient has extra beats per patient   . Heart murmur     no problem   . Nocturia   . Arthritis     arthritis in hip   . Peripheral neuropathy     feet  . GERD (gastroesophageal reflux disease)     under control  . Esophageal dysmotility   . Low back pain with sciatica   . DDD (degenerative disc disease), cervical     and lower back  . Glaucoma     "suspect"  . Cataract     beginning  . Varicose veins   . Spider veins   . Diverticulosis   . Osteoporosis   . Urinary incontinence     occasional  . Gout     one finger x1 attack  . Measles     hx of  . History of chicken pox   . Acne rosacea   . Complication of anesthesia     "really sore throat"  . PONV (postoperative nausea and vomiting)   . RSD (reflex sympathetic dystrophy) 1993    Past Surgical History  Procedure Laterality Date  . Melanoma surgery   1986, 1997  . Hardware removal Left 10/17/2013    Procedure:  HARDWARE REMOVAL LEFT HIP;  Surgeon: Gearlean Alf, MD;  [facility];  Service: Orthopedics;  Laterality: Left;  . Umbilical hernia repair N/A 02/08/2014    Procedure: HERNIA REPAIR UMBILICAL ADULT WITH MESH;  Surgeon: Jackolyn Confer, MD;  [facility];  Service: General;  Laterality: N/A;  . Insertion of mesh  02/08/2014    Procedure: INSERTION OF MESH;  Surgeon: Jackolyn Confer, MD;  [facility];  Service: General;;  . Hip fracture surgery Left 2006    3 screws  . Total hip arthroplasty Left 04/10/2014    Procedure: LEFT TOTAL HIP ARTHROPLASTY ANTERIOR APPROACH;  Surgeon: Gearlean Alf, MD;  [facility];  Service: Orthopedics;  Laterality: Left;    There were no vitals filed for this visit.  Visit Diagnosis:  Right hip pain  Difficulty walking      Subjective Assessment - 05/24/14 1535    Symptoms I was very sore after the last treatment, pain in the lft buttock   Pertinent History  HOH, lumbar DDD, had PT here after hardware removal last year in the left hip   Limitations Sitting;Standing;Walking;House hold activities   Patient Stated Goals walk without any assistive device, and go up stairs step over step and have no pain   Currently in Pain? Yes   Pain Score 5    Pain Location Buttocks   Pain Orientation Left   Pain Descriptors / Indicators Aching   Pain Onset 1 to 4 weeks ago                       [facility] Adult PT Treatment/Exercise - 05/24/14 0001    Ambulation/Gait   Ambulation/Gait Assistance Details 700 feet   Assistive device Straight cane   Ambulation Surface Unlevel;Paved   High Level Balance   High Level Balance Activities Side stepping;Backward walking;Tandem walking;Weight-shifting turns   Knee/Hip Exercises: Stretches   Active Hamstring Stretch 2 reps;20 seconds   Knee/Hip Exercises: Aerobic   Stationary Bike NuStep Level 6 x 6 min   Knee/Hip Exercises: Machines for Strengthening   Cybex Knee Extension 10# 3x10   Cybex  Knee Flexion 25# 3x10   Cybex Leg Press 30# 2x10   Knee/Hip Exercises: Standing   Hip ADduction 2 sets;10 reps   Moist Heat Therapy   Number Minutes Moist Heat 15 Minutes   Moist Heat Location Other (comment)   Electrical Stimulation   Electrical Stimulation Location left buttock   Electrical Stimulation Parameters IFC   Electrical Stimulation Goals Pain                  PT Short Term Goals - 05/03/14 1645    PT SHORT TERM GOAL #1   Title independent with HEP   Time 2   Period Weeks   Status New           PT Long Term Goals - 05/10/14 1749    PT LONG TERM GOAL #1   Title go up and down stairs step over step   Status Partially Met   PT LONG TERM GOAL #2   Title decrease pain 50%   Status On-going   PT LONG TERM GOAL #3   Title walk without assistive device for all distances   Status On-going               Plan - 05/24/14 1621    Clinical Impression Statement Some difficulty iwth walking   Pt will benefit from skilled therapeutic intervention in order to improve on the following deficits Abnormal gait;Decreased activity tolerance;Decreased balance;Decreased endurance;Decreased mobility;Decreased strength;Decreased range of motion;Difficulty walking;Impaired flexibility;Pain   Rehab Potential Good   PT Frequency 3x / week   PT Duration 2 weeks        Problem List Patient Active Problem List   Diagnosis Date Noted  . Avascular necrosis of femur head, left 04/10/2014  . OA (osteoarthritis) of hip 04/10/2014  . Painful orthopaedic hardware 10/17/2013    Sumner Boast, PT 05/24/2014, 4:23 PM  [place], [place], [address] Phone: [phone]   Fax:  [phone]

## 2014-05-30 ENCOUNTER — Emergency Department (HOSPITAL_COMMUNITY)
Admission: EM | Admit: 2014-05-30 | Discharge: 2014-05-30 | Disposition: A | Discharge status: Home / Self Care | Payer: Medicare Other | Attending: Emergency Medicine | Admitting: Emergency Medicine

## 2014-05-30 ENCOUNTER — Encounter (HOSPITAL_COMMUNITY): Payer: Self-pay

## 2014-05-30 ENCOUNTER — Ambulatory Visit: Payer: Medicare Other | Admitting: Physical Therapy

## 2014-05-30 DIAGNOSIS — Z8619 Personal history of other infectious and parasitic diseases: Secondary | ICD-10-CM | POA: Diagnosis not present

## 2014-05-30 DIAGNOSIS — S60562A Insect bite (nonvenomous) of left hand, initial encounter: Secondary | ICD-10-CM | POA: Diagnosis present

## 2014-05-30 DIAGNOSIS — W57XXXA Bitten or stung by nonvenomous insect and other nonvenomous arthropods, initial encounter: Secondary | ICD-10-CM | POA: Insufficient documentation

## 2014-05-30 DIAGNOSIS — F419 Anxiety disorder, unspecified: Secondary | ICD-10-CM | POA: Diagnosis not present

## 2014-05-30 DIAGNOSIS — Z880 Allergy status to penicillin: Secondary | ICD-10-CM | POA: Insufficient documentation

## 2014-05-30 DIAGNOSIS — Y998 Other external cause status: Secondary | ICD-10-CM | POA: Diagnosis not present

## 2014-05-30 DIAGNOSIS — Y9289 Other specified places as the place of occurrence of the external cause: Secondary | ICD-10-CM | POA: Insufficient documentation

## 2014-05-30 DIAGNOSIS — H409 Unspecified glaucoma: Secondary | ICD-10-CM | POA: Diagnosis not present

## 2014-05-30 DIAGNOSIS — R011 Cardiac murmur, unspecified: Secondary | ICD-10-CM | POA: Insufficient documentation

## 2014-05-30 DIAGNOSIS — Z79899 Other long term (current) drug therapy: Secondary | ICD-10-CM | POA: Diagnosis not present

## 2014-05-30 DIAGNOSIS — H9190 Unspecified hearing loss, unspecified ear: Secondary | ICD-10-CM | POA: Insufficient documentation

## 2014-05-30 DIAGNOSIS — Z792 Long term (current) use of antibiotics: Secondary | ICD-10-CM | POA: Diagnosis not present

## 2014-05-30 DIAGNOSIS — M1990 Unspecified osteoarthritis, unspecified site: Secondary | ICD-10-CM | POA: Diagnosis not present

## 2014-05-30 DIAGNOSIS — Z8719 Personal history of other diseases of the digestive system: Secondary | ICD-10-CM | POA: Insufficient documentation

## 2014-05-30 DIAGNOSIS — Y9389 Activity, other specified: Secondary | ICD-10-CM | POA: Insufficient documentation

## 2014-05-30 DIAGNOSIS — Z85820 Personal history of malignant melanoma of skin: Secondary | ICD-10-CM | POA: Insufficient documentation

## 2014-05-30 DIAGNOSIS — E039 Hypothyroidism, unspecified: Secondary | ICD-10-CM | POA: Diagnosis not present

## 2014-05-30 NOTE — ED Provider Notes (Signed)
CSN: [id number]     Arrival date & time 05/30/14  1202 History  This chart was scribed for non-physician practitioner, Waynetta Pean, working with Pamella Pert, MD by Molli Posey, ED Scribe. This patient was seen in room WTR6/WTR6 and the patient's care was started at 3:10 PM.   Chief Complaint  Patient presents with  . Insect Bite   The history is provided by the patient. No language interpreter was used.   HPI Comments: Brooke Jimenez is a 69 y.o. female with a history of hypothyroidism, GERD and arthritis who presents to the Emergency Department complaining of an insect bite on the top of her left hand that she noticed this morning. Pt reports associated swelling to the area and says that she has pain when she moves her fingers or makes a fist but has no pain at rest. She says she is unsure what type of insect bit her. She never saw the insect. She denies itching.  Pt states she has taken no medications PTA. Pt reports no alleviating factors at this time. She denies any itching to the area or any trouble breathing.  Past Medical History  Diagnosis Date  . Mitral valve prolapse   . Hypothyroidism   . Anxiety   . H/O hiatal hernia   . History of melanoma     melanoma- 1986 and 1997   . PTSD (post-traumatic stress disorder) 2006  . Corneal dystrophy   . Dysphagia     chronic- please see ultrasound done 4/16 15 in [facility]   . Hearing loss     bilateral due to nerve loss. wears hearing aides  . Family history of adverse reaction to anesthesia     mother slow to wake up   . Dysrhythmia     sometimes patient has extra beats per patient   . Heart murmur     no problem   . Nocturia   . Arthritis     arthritis in hip   . Peripheral neuropathy     feet  . GERD (gastroesophageal reflux disease)     under control  . Esophageal dysmotility   . Low back pain with sciatica   . DDD (degenerative disc disease), cervical     and lower back  . Glaucoma     "suspect"  . Cataract      beginning  . Varicose veins   . Spider veins   . Diverticulosis   . Osteoporosis   . Urinary incontinence     occasional  . Gout     one finger x1 attack  . Measles     hx of  . History of chicken pox   . Acne rosacea   . Complication of anesthesia     "really sore throat"  . PONV (postoperative nausea and vomiting)   . RSD (reflex sympathetic dystrophy) 1993   Past Surgical History  Procedure Laterality Date  . Melanoma surgery   1986, 1997  . Hardware removal Left 10/17/2013    Procedure: HARDWARE REMOVAL LEFT HIP;  Surgeon: Gearlean Alf, MD;  Location: [facility];  Service: Orthopedics;  Laterality: Left;  . Umbilical hernia repair N/A 02/08/2014    Procedure: HERNIA REPAIR UMBILICAL ADULT WITH MESH;  Surgeon: Jackolyn Confer, MD;  Location: [facility];  Service: General;  Laterality: N/A;  . Insertion of mesh  02/08/2014    Procedure: INSERTION OF MESH;  Surgeon: Jackolyn Confer, MD;  Location: [facility];  Service: General;;  . Hip fracture surgery  Left 2006    3 screws  . Total hip arthroplasty Left 04/10/2014    Procedure: LEFT TOTAL HIP ARTHROPLASTY ANTERIOR APPROACH;  Surgeon: Gearlean Alf, MD;  Location: [facility];  Service: Orthopedics;  Laterality: Left;   Family History  Problem Relation Age of Onset  . Cancer Mother   . Heart failure Father    History  Substance Use Topics  . Smoking status: Never Smoker   . Smokeless tobacco: Never Used  . Alcohol Use: No   OB History    No data available     Review of Systems  Constitutional: Negative for fever.  Respiratory: Negative for shortness of breath.   Gastrointestinal: Negative for abdominal pain.  Skin: Positive for color change and rash. Negative for wound.      Allergies  Codeine; Doxycycline; Epinephrine; Erythromycin; Levaquin; Adhesive; Penicillins; and Sulfa antibiotics  Home Medications   Prior to Admission medications   Medication Sig Start Date End Date Taking? Authorizing Provider  acetaminophen  (TYLENOL) 500 MG tablet Take 500 mg by mouth every 6 (six) hours as needed for mild pain.   Yes Historical Provider, MD  clindamycin (CLEOCIN) 150 MG capsule Take 600 mg by mouth. One hour before dental appointments   Yes Historical Provider, MD  Docusate Sodium (COLACE PO) Take 1 tablet by mouth 2 (two) times daily.    Yes Historical Provider, MD  ibuprofen (ADVIL,MOTRIN) 200 MG tablet Take 200 mg by mouth 2 (two) times daily as needed for headache or moderate pain.   Yes Historical Provider, MD  levothyroxine (SYNTHROID, LEVOTHROID) 50 MCG tablet Take 50 mcg by mouth daily before breakfast.   Yes Historical Provider, MD  Magnesium 250 MG TABS Take 250 mg by mouth daily.    Yes Historical Provider, MD  minocycline (MINOCIN,DYNACIN) 50 MG capsule Take 50 mg by mouth 2 (two) times daily.   Yes Historical Provider, MD  Multiple Vitamin (MULTIVITAMIN WITH MINERALS) TABS tablet Take 1 tablet by mouth daily.   Yes Historical Provider, MD  sodium chloride (MURO 128) 5 % ophthalmic ointment Place 1 application into both eyes at bedtime.   Yes Historical Provider, MD  HYDROmorphone (DILAUDID) 2 MG tablet Take 1-2 tablets (2-4 mg total) by mouth every 4 (four) hours as needed for moderate pain or severe pain. 04/11/14   Arlee Muslim, PA-C  methocarbamol (ROBAXIN) 500 MG tablet Take 1 tablet (500 mg total) by mouth every 6 (six) hours as needed for muscle spasms. Patient not taking: Reported on 05/30/2014 04/11/14   Arlee Muslim, PA-C  ondansetron (ZOFRAN) 4 MG tablet Take 1 tablet (4 mg total) by mouth every 4 (four) hours as needed for nausea. Patient not taking: Reported on 05/30/2014 04/12/14   Arlee Muslim, PA-C  rivaroxaban (XARELTO) 10 MG TABS tablet Take 1 tablet (10 mg total) by mouth daily with breakfast. Take Xarelto for two and a half more weeks, then discontinue Xarelto. Once the patient has completed the blood thinner regimen, then take a Baby 81 mg Aspirin daily for three more weeks. Patient not  taking: Reported on 05/30/2014 04/11/14   Arlee Muslim, PA-C  traMADol (ULTRAM) 50 MG tablet Take 1-2 tablets (50-100 mg total) by mouth every 6 (six) hours as needed (mild pain). Patient not taking: Reported on 05/30/2014 04/11/14   Arlee Muslim, PA-C   BP 155/81 mmHg  Pulse 80  Temp(Src) 98 F (36.7 C) (Oral)  Resp 16  SpO2 96% Physical Exam  Constitutional: She is oriented to person,  place, and time. She appears well-developed and well-nourished. No distress.  Nontoxic appearing.  HENT:  Head: Normocephalic and atraumatic.  Eyes: Conjunctivae are normal. Pupils are equal, round, and reactive to light. Right eye exhibits no discharge. Left eye exhibits no discharge.  Neck: Neck supple. No tracheal deviation present.  Cardiovascular: Normal rate, regular rhythm, normal heart sounds and intact distal pulses.   Bilateral radial pulses are intact. Capillary refill is normal in her bilateral digits.   Pulmonary/Chest: Effort normal and breath sounds normal. No respiratory distress. She has no wheezes. She has no rales.  Abdominal: Soft. She exhibits no distension. There is no tenderness.  Musculoskeletal: Normal range of motion.  The patient has full and normal range of motion of her left hand and wrist.  Lymphadenopathy:    She has no cervical adenopathy.  Neurological: She is alert and oriented to person, place, and time. Coordination normal.  Skin: Skin is warm and dry. She is not diaphoretic. There is erythema.  There is erythema and mild edema overlying the patient's dorsal aspect of her left hand. There is no evidence of insect bite. The area is mildly tender to palpation.  Psychiatric: She has a normal mood and affect. Her behavior is normal.  Nursing note and vitals reviewed.   ED Course  Procedures   DIAGNOSTIC STUDIES: Oxygen Saturation is 97% on RA, normal by my interpretation.    COORDINATION OF CARE: 3:17 PM Discussed treatment plan with pt at bedside and pt agreed to  plan.   Labs Review Labs Reviewed - No data to display  Imaging Review No results found.   EKG Interpretation None      Filed Vitals:   05/30/14 1221 05/30/14 1535  BP: 155/81   Pulse: 84 80  Temp: 98 F (36.7 C)   TempSrc: Oral   Resp: 16   SpO2: 97% 96%     MDM   Meds given in ED:  Medications - No data to display  Discharge Medication List as of 05/30/2014  3:31 PM      Final diagnoses:  Insect bite hand, left, initial encounter   This is a 69 year old female who presents to the emergency department complaining of swelling and redness over the dorsal aspect of her left hand. Patient reports she was working in a closet earlier today and then a while later noticed that her left hand was slightly swollen and red. She believes she was bitten by an insect however she never saw the insect. On exam the patient is afebrile and nontoxic appearing. She has mild edema and erythema overlying the dorsal aspect of her left hand. The patient reports to me that this has slightly improved since she first noticed it. The area is mildly tender to palpation. She has good capillary refill and normal range of motion of her hand and wrist. There is no obvious sign of insect bite. This does not appear to be cellulitis. She has an atypical presentation for this to be an infection or cellulitis.  I discussed this patient with Dr. Aline Brochure who came and evaluated the patient. Plan is to use rest, ice and ibuprofen to help with her pain and swelling. The patient advises that she has an appointment with her primary care doctor tomorrow. Was decided we would hold off on any antibiotics and have her be reevaluated tomorrow by her primary care doctor. Strict return precautions provided. I advised patient to return if she has worsening pain or the area of redness increases.  I advised the patient to follow-up with their primary care provider tomorrow. I advised the patient to return to the emergency department  with new or worsening symptoms or new concerns. The patient verbalized understanding and agreement with plan.   I personally performed the services described in this documentation, which was scribed in my presence. The recorded information has been reviewed and is accurate.  This patient was discussed with and evaluated by Dr. Aline Brochure who agrees with assessment and plan.      Waynetta Pean, PA-C 05/30/14 [id number]  Pamella Pert, MD 05/31/14 0001

## 2014-05-30 NOTE — ED Notes (Signed)
Patient states she noticed her left hand was swollen and discolored after reaching into a closet. Patient denies any itching, but does c/o soreness of the left hand.

## 2014-05-30 NOTE — Discharge Instructions (Signed)
Please follow-up with your primary care provider tomorrow as discussed. If this worsens please return sooner. Please keep an eye on the area of redness and return if this worsens. Use ice and elevation for the next several days as tolerable.  Insect Bite Mosquitoes, flies, fleas, bedbugs, and many other insects can bite. Insect bites are different from insect stings. A sting is when venom is injected into the skin. Some insect bites can transmit infectious diseases. SYMPTOMS  Insect bites usually turn red, swell, and itch for 2 to 4 days. They often go away on their own. TREATMENT  Your caregiver may prescribe antibiotic medicines if a bacterial infection develops in the bite. HOME CARE INSTRUCTIONS  Do not scratch the bite area.  Keep the bite area clean and dry. Wash the bite area thoroughly with soap and water.  Put ice or cool compresses on the bite area.  Put ice in a plastic bag.  Place a towel between your skin and the bag.  Leave the ice on for 20 minutes, 4 times a day for the first 2 to 3 days, or as directed.  You may apply a baking soda paste, cortisone cream, or calamine lotion to the bite area as directed by your caregiver. This can help reduce itching and swelling.  Only take over-the-counter or prescription medicines as directed by your caregiver.  If you are given antibiotics, take them as directed. Finish them even if you start to feel better. You may need a tetanus shot if:  You cannot remember when you had your last tetanus shot.  You have never had a tetanus shot.  The injury broke your skin. If you get a tetanus shot, your arm may swell, get red, and feel warm to the touch. This is common and not a problem. If you need a tetanus shot and you choose not to have one, there is a rare chance of getting tetanus. Sickness from tetanus can be serious. SEEK IMMEDIATE MEDICAL CARE IF:   You have increased pain, redness, or swelling in the bite area.  You see a red  line on the skin coming from the bite.  You have a fever.  You have joint pain.  You have a headache or neck pain.  You have unusual weakness.  You have a rash.  You have chest pain or shortness of breath.  You have abdominal pain, nausea, or vomiting.  You feel unusually tired or sleepy. MAKE SURE YOU:   Understand these instructions.  Will watch your condition.  Will get help right away if you are not doing well or get worse. Document Released: 03/20/2004 Document Revised: 05/05/2011 Document Reviewed: 09/11/2010 [facility] Patient Information 2015 [place], [place]. This information is not intended to replace advice given to you by your health care provider. Make sure you discuss any questions you have with your health care provider.

## 2014-05-31 ENCOUNTER — Ambulatory Visit: Payer: Medicare Other | Attending: Orthopedic Surgery | Admitting: Physical Therapy

## 2014-05-31 ENCOUNTER — Encounter: Payer: Self-pay | Admitting: Physical Therapy

## 2014-05-31 DIAGNOSIS — M25551 Pain in right hip: Secondary | ICD-10-CM | POA: Diagnosis present

## 2014-05-31 DIAGNOSIS — R262 Difficulty in walking, not elsewhere classified: Secondary | ICD-10-CM

## 2014-05-31 NOTE — Therapy (Signed)
[place], [place], [address] Phone: [phone]   Fax:  [phone]  Physical Therapy Treatment  Patient Details  Name: Trang Bouse MRN: [id number] Date of Birth: 1945-04-10 Referring Provider:  Gaynelle Arabian, MD  Encounter Date: 05/31/2014      PT End of Session - 05/31/14 1620    Visit Number 8   Date for PT Re-Evaluation 07/02/14   PT Start Time 1632      Past Medical History  Diagnosis Date  . Mitral valve prolapse   . Hypothyroidism   . Anxiety   . H/O hiatal hernia   . History of melanoma     melanoma- 1986 and 1997   . PTSD (post-traumatic stress disorder) 2006  . Corneal dystrophy   . Dysphagia     chronic- please see ultrasound done 4/16 15 in [facility]   . Hearing loss     bilateral due to nerve loss. wears hearing aides  . Family history of adverse reaction to anesthesia     mother slow to wake up   . Dysrhythmia     sometimes patient has extra beats per patient   . Heart murmur     no problem   . Nocturia   . Arthritis     arthritis in hip   . Peripheral neuropathy     feet  . GERD (gastroesophageal reflux disease)     under control  . Esophageal dysmotility   . Low back pain with sciatica   . DDD (degenerative disc disease), cervical     and lower back  . Glaucoma     "suspect"  . Cataract     beginning  . Varicose veins   . Spider veins   . Diverticulosis   . Osteoporosis   . Urinary incontinence     occasional  . Gout     one finger x1 attack  . Measles     hx of  . History of chicken pox   . Acne rosacea   . Complication of anesthesia     "really sore throat"  . PONV (postoperative nausea and vomiting)   . RSD (reflex sympathetic dystrophy) 1993    Past Surgical History  Procedure Laterality Date  . Melanoma surgery   1986, 1997  . Hardware removal Left 10/17/2013    Procedure: HARDWARE REMOVAL LEFT HIP;  Surgeon: Gearlean Alf, MD;   Location: [facility];  Service: Orthopedics;  Laterality: Left;  . Umbilical hernia repair N/A 02/08/2014    Procedure: HERNIA REPAIR UMBILICAL ADULT WITH MESH;  Surgeon: Jackolyn Confer, MD;  Location: [facility];  Service: General;  Laterality: N/A;  . Insertion of mesh  02/08/2014    Procedure: INSERTION OF MESH;  Surgeon: Jackolyn Confer, MD;  Location: [facility];  Service: General;;  . Hip fracture surgery Left 2006    3 screws  . Total hip arthroplasty Left 04/10/2014    Procedure: LEFT TOTAL HIP ARTHROPLASTY ANTERIOR APPROACH;  Surgeon: Gearlean Alf, MD;  Location: [facility];  Service: Orthopedics;  Laterality: Left;    There were no vitals filed for this visit.  Visit Diagnosis:  Right hip pain  Difficulty walking      Subjective Assessment - 05/31/14 1529    Subjective I felt better after the last treatment   Pertinent History HOH, lumbar DDD, had PT here after hardware removal last year in the left hip   Limitations Sitting;Standing;Walking;House hold  activities   Patient Stated Goals walk without any assistive device, and go up stairs step over step and have no pain   Currently in Pain? Yes   Pain Score 2    Pain Location Hip   Pain Orientation Left   Pain Descriptors / Indicators Aching;Sore   Pain Type Acute pain   Pain Onset 1 to 4 weeks ago   Pain Frequency Intermittent   Aggravating Factors  A lot of activity   Pain Relieving Factors the heat really felt good after last time                       [facility] Adult PT Treatment/Exercise - 05/31/14 0001    Ambulation/Gait   Ambulation/Gait Assistance Details 500   Assistive device None   Gait Pattern Decreased stride length   Gait Comments cues needed for step length   High Level Balance   High Level Balance Activities Side stepping;Backward walking;Tandem walking;Weight-shifting turns   Knee/Hip Exercises: Stretches   Passive Hamstring Stretch 20 seconds;4 reps   Piriformis Stretch 20 seconds;4 reps    Knee/Hip Exercises: Aerobic   Stationary Bike NuStep Level 6 x 6 min   Knee/Hip Exercises: Machines for Strengthening   Cybex Knee Extension 10# 3x10   Cybex Knee Flexion 25# 3x10   Cybex Leg Press 30# 2x10   Knee/Hip Exercises: Standing   Hip ADduction Strengthening;Both;2 sets;10 reps  5#   Gait Training practice on the stairs   Other Standing Knee Exercises resisted gait all directions   Knee/Hip Exercises: Sidelying   Other Sidelying Knee Exercises hip flexor stretch   Moist Heat Therapy   Number Minutes Moist Heat 15 Minutes   Moist Heat Location Other (comment)   Electrical Stimulation   Electrical Stimulation Location left buttock   Electrical Stimulation Parameters IFC   Electrical Stimulation Goals Pain                  PT Short Term Goals - 05/03/14 1645    PT SHORT TERM GOAL #1   Title independent with HEP   Time 2   Period Weeks   Status New           PT Long Term Goals - 05/10/14 1749    PT LONG TERM GOAL #1   Title go up and down stairs step over step   Status Partially Met   PT LONG TERM GOAL #2   Title decrease pain 50%   Status On-going   PT LONG TERM GOAL #3   Title walk without assistive device for all distances   Status On-going               Plan - 05/31/14 1621    Clinical Impression Statement doing better, when concentrating on gait she does worse, needs to just go and move   Pt will benefit from skilled therapeutic intervention in order to improve on the following deficits Abnormal gait;Decreased activity tolerance;Decreased balance;Decreased endurance;Decreased mobility;Decreased strength;Decreased range of motion;Difficulty walking;Impaired flexibility;Pain   PT Next Visit Plan progress strengthening and gait        Problem List Patient Active Problem List   Diagnosis Date Noted  . Avascular necrosis of femur head, left 04/10/2014  . OA (osteoarthritis) of hip 04/10/2014  . Painful orthopaedic hardware 10/17/2013     Sumner Boast, PT 05/31/2014, 4:22 PM  [place], [place], [address] Phone: [phone]  Fax:  [phone]

## 2014-06-01 ENCOUNTER — Encounter: Payer: Self-pay | Admitting: Physical Therapy

## 2014-06-01 ENCOUNTER — Ambulatory Visit: Payer: Medicare Other | Admitting: Physical Therapy

## 2014-06-01 DIAGNOSIS — M25551 Pain in right hip: Secondary | ICD-10-CM | POA: Diagnosis not present

## 2014-06-01 DIAGNOSIS — R262 Difficulty in walking, not elsewhere classified: Secondary | ICD-10-CM

## 2014-06-01 NOTE — Therapy (Signed)
[place], [place], [address] Phone: [phone]   Fax:  [phone]  Physical Therapy Treatment  Patient Details  Name: Brooke Jimenez MRN: [id number] Date of Birth: Nov 29, 1945 Referring Provider:  Gaynelle Arabian, MD  Encounter Date: 06/01/2014      PT End of Session - 06/01/14 1541    Visit Number 9   Date for PT Re-Evaluation 07/02/14   PT Start Time 1450   PT Stop Time 1555   PT Time Calculation (min) 65 min      Past Medical History  Diagnosis Date  . Mitral valve prolapse   . Hypothyroidism   . Anxiety   . H/O hiatal hernia   . History of melanoma     melanoma- 1986 and 1997   . PTSD (post-traumatic stress disorder) 2006  . Corneal dystrophy   . Dysphagia     chronic- please see ultrasound done 4/16 15 in [facility]   . Hearing loss     bilateral due to nerve loss. wears hearing aides  . Family history of adverse reaction to anesthesia     mother slow to wake up   . Dysrhythmia     sometimes patient has extra beats per patient   . Heart murmur     no problem   . Nocturia   . Arthritis     arthritis in hip   . Peripheral neuropathy     feet  . GERD (gastroesophageal reflux disease)     under control  . Esophageal dysmotility   . Low back pain with sciatica   . DDD (degenerative disc disease), cervical     and lower back  . Glaucoma     "suspect"  . Cataract     beginning  . Varicose veins   . Spider veins   . Diverticulosis   . Osteoporosis   . Urinary incontinence     occasional  . Gout     one finger x1 attack  . Measles     hx of  . History of chicken pox   . Acne rosacea   . Complication of anesthesia     "really sore throat"  . PONV (postoperative nausea and vomiting)   . RSD (reflex sympathetic dystrophy) 1993    Past Surgical History  Procedure Laterality Date  . Melanoma surgery   1986, 1997  . Hardware removal Left 10/17/2013    Procedure:  HARDWARE REMOVAL LEFT HIP;  Surgeon: Gearlean Alf, MD;  [facility]: [facility];  Service: Orthopedics;  Laterality: Left;  . Umbilical hernia repair N/A 02/08/2014    Procedure: HERNIA REPAIR UMBILICAL ADULT WITH MESH;  Surgeon: Jackolyn Confer, MD;  Location: [facility];  Service: General;  Laterality: N/A;  . Insertion of mesh  02/08/2014    Procedure: INSERTION OF MESH;  Surgeon: Jackolyn Confer, MD;  Location: [facility];  Service: General;;  . Hip fracture surgery Left 2006    3 screws  . Total hip arthroplasty Left 04/10/2014    Procedure: LEFT TOTAL HIP ARTHROPLASTY ANTERIOR APPROACH;  Surgeon: Gearlean Alf, MD;  [facility];  Service: Orthopedics;  Laterality: Left;    There were no vitals filed for this visit.  Visit Diagnosis:  Right hip pain  Difficulty walking      Subjective Assessment - 06/01/14 1451    Subjective I was fine after last treatment, but then I took the wrong medication and could not sleep,  now I don't feel so good.   Pertinent History HOH, lumbar DDD, had PT here after hardware removal last year in the left hip   Patient Stated Goals walk without any assistive device, and go up stairs step over step and have no pain                       [facility] Adult PT Treatment/Exercise - 06/01/14 0001    Ambulation/Gait   Ambulation/Gait Assistance Details 400   Assistive device None   Gait Comments cues needed for step length   Knee/Hip Exercises: Stretches   Passive Hamstring Stretch 20 seconds;4 reps   Knee/Hip Exercises: Aerobic   Stationary Bike Level 0 x 5 minutes   Knee/Hip Exercises: Machines for Strengthening   Cybex Knee Extension 10# 3x10   Cybex Knee Flexion 25# 3x10   Cybex Leg Press 30# 2x10   Total Gym Leg Press NUSTEP Level 5 x 5 minutes   Knee/Hip Exercises: Standing   Other Standing Knee Exercises hip flexion, abdct, ext 2x10 with 4#   Knee/Hip Exercises: Seated   Other Seated Knee Exercises ball b/n knees squeeze   Moist Heat  Therapy   Number Minutes Moist Heat 15 Minutes   Moist Heat Location Other (comment)   Electrical Stimulation   Electrical Stimulation Location left buttock   Electrical Stimulation Parameters IFC   Electrical Stimulation Goals Pain                  PT Short Term Goals - 05/03/14 1645    PT SHORT TERM GOAL #1   Title independent with HEP   Time 2   Period Weeks   Status New           PT Long Term Goals - 05/10/14 1749    PT LONG TERM GOAL #1   Title go up and down stairs step over step   Status Partially Met   PT LONG TERM GOAL #2   Title decrease pain 50%   Status On-going   PT LONG TERM GOAL #3   Title walk without assistive device for all distances   Status On-going               Plan - 06/01/14 1542    Clinical Impression Statement First few steps are very antalgic on the left, has difficulty doing correctly   Pt will benefit from skilled therapeutic intervention in order to improve on the following deficits Abnormal gait;Decreased activity tolerance;Decreased balance;Decreased endurance;Decreased mobility;Decreased strength;Decreased range of motion;Difficulty walking;Impaired flexibility;Pain   PT Next Visit Plan progress strengthening and gait   Consulted and Agree with Plan of Care Patient        Problem List Patient Active Problem List   Diagnosis Date Noted  . Avascular necrosis of femur head, left 04/10/2014  . OA (osteoarthritis) of hip 04/10/2014  . Painful orthopaedic hardware 10/17/2013    Sumner Boast, PT 06/01/2014, 4:11 PM  [place], [place], [address] Phone: [phone]   Fax:  [phone]

## 2014-06-05 ENCOUNTER — Ambulatory Visit: Payer: Medicare Other | Admitting: Physical Therapy

## 2014-06-05 ENCOUNTER — Encounter: Payer: Self-pay | Admitting: Physical Therapy

## 2014-06-05 DIAGNOSIS — M25551 Pain in right hip: Secondary | ICD-10-CM

## 2014-06-05 DIAGNOSIS — R262 Difficulty in walking, not elsewhere classified: Secondary | ICD-10-CM

## 2014-06-05 NOTE — Therapy (Signed)
[place], [place], [address] Phone: [phone]   Fax:  [phone]  Physical Therapy Treatment  Patient Details  Name: Brooke Jimenez MRN: [id number] Date of Birth: [birth year] Referring Provider:  Gaynelle Arabian, MD  Encounter Date: 06/05/2014      PT End of Session - 06/05/14 1152    Visit Number 10   Date for PT Re-Evaluation 07/02/14   PT Start Time 1102   PT Stop Time 1153   PT Time Calculation (min) 51 min      Past Medical History  Diagnosis Date  . Mitral valve prolapse   . Hypothyroidism   . Anxiety   . H/O hiatal hernia   . History of melanoma     melanoma- 1986 and 1997   . PTSD (post-traumatic stress disorder) 2006  . Corneal dystrophy   . Dysphagia     chronic- please see ultrasound done 4/16 15 in [facility]   . Hearing loss     bilateral due to nerve loss. wears hearing aides  . Family history of adverse reaction to anesthesia     mother slow to wake up   . Dysrhythmia     sometimes patient has extra beats per patient   . Heart murmur     no problem   . Nocturia   . Arthritis     arthritis in hip   . Peripheral neuropathy     feet  . GERD (gastroesophageal reflux disease)     under control  . Esophageal dysmotility   . Low back pain with sciatica   . DDD (degenerative disc disease), cervical     and lower back  . Glaucoma     "suspect"  . Cataract     beginning  . Varicose veins   . Spider veins   . Diverticulosis   . Osteoporosis   . Urinary incontinence     occasional  . Gout     one finger x1 attack  . Measles     hx of  . History of chicken pox   . Acne rosacea   . Complication of anesthesia     "really sore throat"  . PONV (postoperative nausea and vomiting)   . RSD (reflex sympathetic dystrophy) 1993    Past Surgical History  Procedure Laterality Date  . Melanoma surgery   1986, 1997  . Hardware removal Left 10/17/2013    Procedure:  HARDWARE REMOVAL LEFT HIP;  Surgeon: Gearlean Alf, MD;  [facility];  Service: Orthopedics;  Laterality: Left;  . Umbilical hernia repair N/A 02/08/2014    Procedure: HERNIA REPAIR UMBILICAL ADULT WITH MESH;  Surgeon: Jackolyn Confer, MD;  [facility];  Service: General;  Laterality: N/A;  . Insertion of mesh  02/08/2014    Procedure: INSERTION OF MESH;  Surgeon: Jackolyn Confer, MD;  [facility];  Service: General;;  . Hip fracture surgery Left 2006    3 screws  . Total hip arthroplasty Left 04/10/2014    Procedure: LEFT TOTAL HIP ARTHROPLASTY ANTERIOR APPROACH;  Surgeon: Gearlean Alf, MD;  [facility];  Service: Orthopedics;  Laterality: Left;    There were no vitals filed for this visit.  Visit Diagnosis:  Right hip pain  Difficulty walking      Subjective Assessment - 06/05/14 1150    Subjective I had a great weekend, some soreness but not bad   Patient Stated Goals walk without  any assistive device, and go up stairs step over step and have no pain                       [facility] Adult PT Treatment/Exercise - 06/11/2014 0001    Ambulation/Gait   Ambulation/Gait Assistance Details 350   Assistive device None   Gait Pattern Decreased stride length   Ambulation Surface Level   High Level Balance   High Level Balance Activities Side stepping;Backward walking;Tandem walking;Weight-shifting turns   Knee/Hip Exercises: Machines for Strengthening   Cybex Knee Extension 10# 3x10   Cybex Knee Flexion 25# 3x10   Cybex Leg Press 30# 2x10   Total Gym Leg Press NUSTEP Level 5 x 5 minutes   Knee/Hip Exercises: Standing   Other Standing Knee Exercises hip flexion, abdct, ext 2x10 with 4#   Other Standing Knee Exercises resisted gait all directions   Knee/Hip Exercises: Seated   Other Seated Knee Exercises ball b/n knees squeeze   Knee/Hip Exercises: Sidelying   Other Sidelying Knee Exercises clams 2x15                  PT Short Term Goals  - 05/03/14 1645    PT SHORT TERM GOAL #1   Title independent with HEP   Time 2   Period Weeks   Status New           PT Long Term Goals - 05/10/14 1749    PT LONG TERM GOAL #1   Title go up and down stairs step over step   Status Partially Met   PT LONG TERM GOAL #2   Title decrease pain 50%   Status On-going   PT LONG TERM GOAL #3   Title walk without assistive device for all distances   Status On-going               Plan - June 11, 2014 1153    Clinical Impression Statement Much improved gait, less "wobble"   Pt will benefit from skilled therapeutic intervention in order to improve on the following deficits Abnormal gait;Decreased activity tolerance;Decreased balance;Decreased endurance;Decreased mobility;Decreased strength;Decreased range of motion;Difficulty walking;Impaired flexibility;Pain   Rehab Potential Good   PT Frequency 2x / week   PT Duration 2 weeks   PT Treatment/Interventions Electrical Stimulation;Moist Heat;Ultrasound;Gait training;Stair training;Therapeutic activities;Balance training;Therapeutic exercise;Patient/family education;Manual techniques   PT Next Visit Plan continue to work on functional strength and gait   Consulted and Agree with Plan of Care Patient          G-Codes - 06/11/14 1156    Functional Assessment Tool Used FOTO   Functional Limitation Mobility: Walking and moving around   Mobility: Walking and Moving Around Goal Status [phone]) At least 40 percent but less than 60 percent impaired, limited or restricted   Mobility: Walking and Moving Around Discharge Status [phone]) At least 40 percent but less than 60 percent impaired, limited or restricted      Problem List Patient Active Problem List   Diagnosis Date Noted  . Avascular necrosis of femur head, left 04/10/2014  . OA (osteoarthritis) of hip 04/10/2014  . Painful orthopaedic hardware 10/17/2013    Sumner Boast, PT 06/11/2014, 11:57 AM  [place] [address] [place], [place], [address] Phone: [phone]   Fax:  [phone]

## 2014-06-07 ENCOUNTER — Encounter: Payer: Self-pay | Admitting: Physical Therapy

## 2014-06-07 ENCOUNTER — Ambulatory Visit: Payer: Medicare Other | Admitting: Physical Therapy

## 2014-06-07 DIAGNOSIS — R262 Difficulty in walking, not elsewhere classified: Secondary | ICD-10-CM

## 2014-06-07 DIAGNOSIS — M25551 Pain in right hip: Secondary | ICD-10-CM | POA: Diagnosis not present

## 2014-06-07 NOTE — Therapy (Signed)
[place], [place], [address] Phone: [phone]   Fax:  [phone]  Physical Therapy Treatment  Patient Details  Name: Brooke Jimenez MRN: [id number] Date of Birth: Apr 21, 1945 Referring Provider:  Gaynelle Arabian, MD  Encounter Date: 06/07/2014      PT End of Session - 06/07/14 1554    Visit Number 11   Date for PT Re-Evaluation 07/02/14   PT Start Time 6962   PT Stop Time 1540   PT Time Calculation (min) 53 min      Past Medical History  Diagnosis Date  . Mitral valve prolapse   . Hypothyroidism   . Anxiety   . H/O hiatal hernia   . History of melanoma     melanoma- 1986 and 1997   . PTSD (post-traumatic stress disorder) 2006  . Corneal dystrophy   . Dysphagia     chronic- please see ultrasound done 4/16 15 in [facility]   . Hearing loss     bilateral due to nerve loss. wears hearing aides  . Family history of adverse reaction to anesthesia     mother slow to wake up   . Dysrhythmia     sometimes patient has extra beats per patient   . Heart murmur     no problem   . Nocturia   . Arthritis     arthritis in hip   . Peripheral neuropathy     feet  . GERD (gastroesophageal reflux disease)     under control  . Esophageal dysmotility   . Low back pain with sciatica   . DDD (degenerative disc disease), cervical     and lower back  . Glaucoma     "suspect"  . Cataract     beginning  . Varicose veins   . Spider veins   . Diverticulosis   . Osteoporosis   . Urinary incontinence     occasional  . Gout     one finger x1 attack  . Measles     hx of  . History of chicken pox   . Acne rosacea   . Complication of anesthesia     "really sore throat"  . PONV (postoperative nausea and vomiting)   . RSD (reflex sympathetic dystrophy) 1993    Past Surgical History  Procedure Laterality Date  . Melanoma surgery   1986, 1997  . Hardware removal Left 10/17/2013    Procedure:  HARDWARE REMOVAL LEFT HIP;  Surgeon: Gearlean Alf, MD;  [facility];  Service: Orthopedics;  Laterality: Left;  . Umbilical hernia repair N/A 02/08/2014    Procedure: HERNIA REPAIR UMBILICAL ADULT WITH MESH;  Surgeon: Jackolyn Confer, MD;  [facility];  Service: General;  Laterality: N/A;  . Insertion of mesh  02/08/2014    Procedure: INSERTION OF MESH;  Surgeon: Jackolyn Confer, MD;  [facility];  Service: General;;  . Hip fracture surgery Left 2006    3 screws  . Total hip arthroplasty Left 04/10/2014    Procedure: LEFT TOTAL HIP ARTHROPLASTY ANTERIOR APPROACH;  Surgeon: Gearlean Alf, MD;  [facility];  Service: Orthopedics;  Laterality: Left;    There were no vitals filed for this visit.  Visit Diagnosis:  Right hip pain  Difficulty walking      Subjective Assessment - 06/07/14 1452    Subjective A little sore after last treatment, also used stairs last night that were steep.   Pertinent  History HOH, lumbar DDD, had PT here after hardware removal last year in the left hip   Patient Stated Goals walk without any assistive device, and go up stairs step over step and have no pain   Currently in Pain? Yes   Pain Score 2    Pain Location Hip   Pain Orientation Left   Pain Descriptors / Indicators Aching;Sore   Pain Type Acute pain   Pain Onset 1 to 4 weeks ago   Aggravating Factors  stairs   Pain Relieving Factors rest and the heat help                       [facility] Adult PT Treatment/Exercise - 06/07/14 0001    Ambulation/Gait   Ambulation/Gait Assistance Details 500   Gait Comments also did step over step up and down the stairs   High Level Balance   High Level Balance Activities Side stepping;Backward walking;Tandem walking;Weight-shifting turns   Knee/Hip Exercises: Machines for Strengthening   Cybex Knee Extension 10# 3x10   Cybex Knee Flexion 25# 3x10   Cybex Leg Press 30# 2x10   Total Gym Leg Press NUSTEP Level 6 x 6 minutes    Knee/Hip Exercises: Standing   Walking with Sports Cord black tband fwd    Gait Training practice on the stairs   Other Standing Knee Exercises resisted gait all directions   Knee/Hip Exercises: Seated   Other Seated Knee Exercises ball b/n knees squeeze   Knee/Hip Exercises: Sidelying   Other Sidelying Knee Exercises clams 2x15                  PT Short Term Goals - 05/03/14 1645    PT SHORT TERM GOAL #1   Title independent with HEP   Time 2   Period Weeks   Status New           PT Long Term Goals - 05/10/14 1749    PT LONG TERM GOAL #1   Title go up and down stairs step over step   Status Partially Met   PT LONG TERM GOAL #2   Title decrease pain 50%   Status On-going   PT LONG TERM GOAL #3   Title walk without assistive device for all distances   Status On-going               Plan - 06/07/14 1555    Clinical Impression Statement Much less gait deviation.  Very good on the stairs   Rehab Potential Good   PT Frequency 2x / week   PT Duration 2 weeks   PT Next Visit Plan continue to work on functional strength and gait   Consulted and Agree with Plan of Care Patient        Problem List Patient Active Problem List   Diagnosis Date Noted  . Avascular necrosis of femur head, left 04/10/2014  . OA (osteoarthritis) of hip 04/10/2014  . Painful orthopaedic hardware 10/17/2013    Sumner Boast, PT 06/07/2014, 3:57 PM  [place] [address] [place], [place], [address] Phone: [phone]   Fax:  [phone]

## 2014-06-12 ENCOUNTER — Ambulatory Visit: Payer: Medicare Other | Admitting: Physical Therapy

## 2014-06-12 ENCOUNTER — Encounter: Payer: Self-pay | Admitting: Physical Therapy

## 2014-06-12 DIAGNOSIS — M25551 Pain in right hip: Secondary | ICD-10-CM | POA: Diagnosis not present

## 2014-06-12 DIAGNOSIS — R262 Difficulty in walking, not elsewhere classified: Secondary | ICD-10-CM

## 2014-06-12 NOTE — Therapy (Signed)
[place], [place], [address] Phone: [phone]   Fax:  [phone]  Physical Therapy Treatment  Patient Details  Name: Brooke Jimenez MRN: [id number] Date of Birth: 11-12-1945 Referring Provider:  Karleen Hampshire., MD  Encounter Date: 06/12/2014      PT End of Session - 06/12/14 1801    Visit Number 12   Date for PT Re-Evaluation 07/02/14   PT Start Time 1093   PT Stop Time 2355   PT Time Calculation (min) 57 min      Past Medical History  Diagnosis Date  . Mitral valve prolapse   . Hypothyroidism   . Anxiety   . H/O hiatal hernia   . History of melanoma     melanoma- 1986 and 1997   . PTSD (post-traumatic stress disorder) 2006  . Corneal dystrophy   . Dysphagia     chronic- please see ultrasound done 4/16 15 in [facility]   . Hearing loss     bilateral due to nerve loss. wears hearing aides  . Family history of adverse reaction to anesthesia     mother slow to wake up   . Dysrhythmia     sometimes patient has extra beats per patient   . Heart murmur     no problem   . Nocturia   . Arthritis     arthritis in hip   . Peripheral neuropathy     feet  . GERD (gastroesophageal reflux disease)     under control  . Esophageal dysmotility   . Low back pain with sciatica   . DDD (degenerative disc disease), cervical     and lower back  . Glaucoma     "suspect"  . Cataract     beginning  . Varicose veins   . Spider veins   . Diverticulosis   . Osteoporosis   . Urinary incontinence     occasional  . Gout     one finger x1 attack  . Measles     hx of  . History of chicken pox   . Acne rosacea   . Complication of anesthesia     "really sore throat"  . PONV (postoperative nausea and vomiting)   . RSD (reflex sympathetic dystrophy) 1993    Past Surgical History  Procedure Laterality Date  . Melanoma surgery   1986, 1997  . Hardware removal Left 10/17/2013    Procedure:  HARDWARE REMOVAL LEFT HIP;  Surgeon: Gearlean Alf, MD;  [facility];  Service: Orthopedics;  Laterality: Left;  . Umbilical hernia repair N/A 02/08/2014    Procedure: HERNIA REPAIR UMBILICAL ADULT WITH MESH;  Surgeon: Jackolyn Confer, MD;  [facility];  Service: General;  Laterality: N/A;  . Insertion of mesh  02/08/2014    Procedure: INSERTION OF MESH;  Surgeon: Jackolyn Confer, MD;  [facility];  Service: General;;  . Hip fracture surgery Left 2006    3 screws  . Total hip arthroplasty Left 04/10/2014    Procedure: LEFT TOTAL HIP ARTHROPLASTY ANTERIOR APPROACH;  Surgeon: Gearlean Alf, MD;  [facility];  Service: Orthopedics;  Laterality: Left;    There were no vitals filed for this visit.  Visit Diagnosis:  Right hip pain  Difficulty walking      Subjective Assessment - 06/12/14 1702    Subjective I did well until Saturday morning and then had pain up to 8-9/10.  She reports  that she sat a lot on Friday   Patient Stated Goals walk without any assistive device, and go up stairs step over step and have no pain   Currently in Pain? Yes   Pain Score 2    Pain Location Hip   Pain Orientation Left   Pain Descriptors / Indicators Aching   Pain Type Acute pain                         [facility] Adult PT Treatment/Exercise - 06/12/14 0001    High Level Balance   High Level Balance Activities Side stepping;Backward walking;Tandem walking;Weight-shifting turns   Knee/Hip Exercises: Stretches   Piriformis Stretch 20 seconds;4 reps   Knee/Hip Exercises: Aerobic   Elliptical 2 minutes   Knee/Hip Exercises: Machines for Strengthening   Cybex Knee Extension 10# 3x10   Cybex Knee Flexion 25# 3x10   Cybex Leg Press 30# 2x10   Total Gym Leg Press NUSTEP Level 6 x 6 minutes   Knee/Hip Exercises: Standing   Gait Training practice on the stairs   Knee/Hip Exercises: Seated   Other Seated Knee Exercises ball b/n knees squeeze   Knee/Hip Exercises:  Sidelying   Other Sidelying Knee Exercises clams 2x15                  PT Short Term Goals - 05/03/14 1645    PT SHORT TERM GOAL #1   Title independent with HEP   Time 2   Period Weeks   Status New           PT Long Term Goals - 05/10/14 1749    PT LONG TERM GOAL #1   Title go up and down stairs step over step   Status Partially Met   PT LONG TERM GOAL #2   Title decrease pain 50%   Status On-going   PT LONG TERM GOAL #3   Title walk without assistive device for all distances   Status On-going               Plan - 06/12/14 1803    Clinical Impression Statement Some pain in the left thigh   Consulted and Agree with Plan of Care Patient        Problem List Patient Active Problem List   Diagnosis Date Noted  . Avascular necrosis of femur head, left 04/10/2014  . OA (osteoarthritis) of hip 04/10/2014  . Painful orthopaedic hardware 10/17/2013    Sumner Boast, PT 06/12/2014, 6:04 PM  [place] [address] [place], [place], [address] Phone: [phone]   Fax:  [phone]

## 2014-06-14 ENCOUNTER — Ambulatory Visit: Payer: Medicare Other | Admitting: Physical Therapy

## 2014-06-14 ENCOUNTER — Encounter: Payer: Self-pay | Admitting: Physical Therapy

## 2014-06-14 DIAGNOSIS — M25551 Pain in right hip: Secondary | ICD-10-CM | POA: Diagnosis not present

## 2014-06-14 DIAGNOSIS — R262 Difficulty in walking, not elsewhere classified: Secondary | ICD-10-CM

## 2014-06-14 NOTE — Therapy (Signed)
[facility] [place], [place], [address] Phone: [phone]   Fax:  [phone]  Physical Therapy Treatment  Patient Details  Name: Brooke Jimenez MRN: [id number] Date of Birth: 02-Oct-1945 Referring Provider:  Gaynelle Arabian, MD  Encounter Date: 06/14/2014      PT End of Session - 06/14/14 1703    Visit Number 13   Date for PT Re-Evaluation 07/02/14   PT Start Time 7858   PT Stop Time 8502   PT Time Calculation (min) 45 min      Past Medical History  Diagnosis Date  . Mitral valve prolapse   . Hypothyroidism   . Anxiety   . H/O hiatal hernia   . History of melanoma     melanoma- 1986 and 1997   . PTSD (post-traumatic stress disorder) 2006  . Corneal dystrophy   . Dysphagia     chronic- please see ultrasound done 4/16 15 in [facility]   . Hearing loss     bilateral due to nerve loss. wears hearing aides  . Family history of adverse reaction to anesthesia     mother slow to wake up   . Dysrhythmia     sometimes patient has extra beats per patient   . Heart murmur     no problem   . Nocturia   . Arthritis     arthritis in hip   . Peripheral neuropathy     feet  . GERD (gastroesophageal reflux disease)     under control  . Esophageal dysmotility   . Low back pain with sciatica   . DDD (degenerative disc disease), cervical     and lower back  . Glaucoma     "suspect"  . Cataract     beginning  . Varicose veins   . Spider veins   . Diverticulosis   . Osteoporosis   . Urinary incontinence     occasional  . Gout     one finger x1 attack  . Measles     hx of  . History of chicken pox   . Acne rosacea   . Complication of anesthesia     "really sore throat"  . PONV (postoperative nausea and vomiting)   . RSD (reflex sympathetic dystrophy) 1993    Past Surgical History  Procedure Laterality Date  . Melanoma surgery   1986, 1997  . Hardware removal Left 10/17/2013    Procedure:  HARDWARE REMOVAL LEFT HIP;  Surgeon: Gearlean Alf, MD;  Location: [facility];  Service: Orthopedics;  Laterality: Left;  . Umbilical hernia repair N/A 02/08/2014    Procedure: HERNIA REPAIR UMBILICAL ADULT WITH MESH;  Surgeon: Jackolyn Confer, MD;  Location: [facility];  Service: General;  Laterality: N/A;  . Insertion of mesh  02/08/2014    Procedure: INSERTION OF MESH;  Surgeon: Jackolyn Confer, MD;  Location: [facility];  Service: General;;  . Hip fracture surgery Left 2006    3 screws  . Total hip arthroplasty Left 04/10/2014    Procedure: LEFT TOTAL HIP ARTHROPLASTY ANTERIOR APPROACH;  Surgeon: Gearlean Alf, MD;  Location: [facility];  Service: Orthopedics;  Laterality: Left;    There were no vitals filed for this visit.  Visit Diagnosis:  Right hip pain  Difficulty walking      Subjective Assessment - 06/14/14 1540    Subjective I have felt good.   Currently in Pain? Yes   Pain Score 1  Pain Location Hip   Pain Orientation Left   Pain Descriptors / Indicators Aching   Pain Type Acute pain   Pain Onset 1 to 4 weeks ago   Pain Frequency Intermittent   Aggravating Factors  walking, stairs   Pain Relieving Factors rest, heat                         [facility] Adult PT Treatment/Exercise - Jun 20, 2014 0001    High Level Balance   High Level Balance Activities Side stepping;Backward walking;Tandem walking;Weight-shifting turns   Knee/Hip Exercises: Stretches   Passive Hamstring Stretch 3 reps;30 seconds   Quad Stretch 3 reps;20 seconds   Hip Flexor Stretch 20 seconds;3 reps   Piriformis Stretch 20 seconds;4 reps   Knee/Hip Exercises: Aerobic   Elliptical 2 minutes   Knee/Hip Exercises: Machines for Strengthening   Cybex Knee Extension 10# 3x10   Cybex Knee Flexion 25# 3x10   Cybex Leg Press 30# 2x10   Total Gym Leg Press NUSTEP Level 6 x 6 minutes   Knee/Hip Exercises: Standing   Other Standing Knee Exercises 5# flexion, abduction and extension                   PT Short Term Goals - 05/03/14 1645    PT SHORT TERM GOAL #1   Title independent with HEP   Time 2   Period Weeks   Status New           PT Long Term Goals - 05/10/14 1749    PT LONG TERM GOAL #1   Title go up and down stairs step over step   Status Partially Met   PT LONG TERM GOAL #2   Title decrease pain 50%   Status On-going   PT LONG TERM GOAL #3   Title walk without assistive device for all distances   Status On-going               Plan - 06/20/14 1704    Clinical Impression Statement Felt better, less pain, and better walking   PT Next Visit Plan continue to work on functional strength and gait          G-Codes - 2014/06/20 0847    Functional Assessment Tool Used FOTO   Functional Limitation Mobility: Walking and moving around   Mobility: Walking and Moving Around Current Status [phone]) At least 40 percent but less than 60 percent impaired, limited or restricted   Mobility: Walking and Moving Around Goal Status [phone]) At least 40 percent but less than 60 percent impaired, limited or restricted      Problem List Patient Active Problem List   Diagnosis Date Noted  . Avascular necrosis of femur head, left 04/10/2014  . OA (osteoarthritis) of hip 04/10/2014  . Painful orthopaedic hardware 10/17/2013    Sumner Boast, PT Jun 20, 2014, 5:05 PM  [facility] [address] [place], [place], [address] Phone: [phone]   Fax:  [phone]

## 2014-06-19 ENCOUNTER — Ambulatory Visit: Payer: Medicare Other | Admitting: Physical Therapy

## 2014-06-19 ENCOUNTER — Encounter: Payer: Self-pay | Admitting: Physical Therapy

## 2014-06-19 DIAGNOSIS — R262 Difficulty in walking, not elsewhere classified: Secondary | ICD-10-CM

## 2014-06-19 DIAGNOSIS — M25551 Pain in right hip: Secondary | ICD-10-CM

## 2014-06-19 NOTE — Therapy (Signed)
[facility] [place], [place], [address] Phone: [phone]   Fax:  [phone]  Physical Therapy Treatment  Patient Details  Name: Brooke Jimenez MRN: [id number] Date of Birth: 09-19-1945 Referring Provider:  Gaynelle Arabian, MD  Encounter Date: 06/19/2014      PT End of Session - 06/19/14 1746    Visit Number 14   PT Start Time 6659   PT Stop Time 9357   PT Time Calculation (min) 49 min      Past Medical History  Diagnosis Date  . Mitral valve prolapse   . Hypothyroidism   . Anxiety   . H/O hiatal hernia   . History of melanoma     melanoma- 1986 and 1997   . PTSD (post-traumatic stress disorder) 2006  . Corneal dystrophy   . Dysphagia     chronic- please see ultrasound done 4/16 15 in [facility]   . Hearing loss     bilateral due to nerve loss. wears hearing aides  . Family history of adverse reaction to anesthesia     mother slow to wake up   . Dysrhythmia     sometimes patient has extra beats per patient   . Heart murmur     no problem   . Nocturia   . Arthritis     arthritis in hip   . Peripheral neuropathy     feet  . GERD (gastroesophageal reflux disease)     under control  . Esophageal dysmotility   . Low back pain with sciatica   . DDD (degenerative disc disease), cervical     and lower back  . Glaucoma     "suspect"  . Cataract     beginning  . Varicose veins   . Spider veins   . Diverticulosis   . Osteoporosis   . Urinary incontinence     occasional  . Gout     one finger x1 attack  . Measles     hx of  . History of chicken pox   . Acne rosacea   . Complication of anesthesia     "really sore throat"  . PONV (postoperative nausea and vomiting)   . RSD (reflex sympathetic dystrophy) 1993    Past Surgical History  Procedure Laterality Date  . Melanoma surgery   1986, 1997  . Hardware removal Left 10/17/2013    Procedure: HARDWARE REMOVAL LEFT HIP;  Surgeon: Gearlean Alf, MD;  [facility];  Service: Orthopedics;  Laterality: Left;  . Umbilical hernia repair N/A 02/08/2014    Procedure: HERNIA REPAIR UMBILICAL ADULT WITH MESH;  Surgeon: Jackolyn Confer, MD;  [facility]: [facility];  Service: General;  Laterality: N/A;  . Insertion of mesh  02/08/2014    Procedure: INSERTION OF MESH;  Surgeon: Jackolyn Confer, MD;  [facility]: [facility];  Service: General;;  . Hip fracture surgery Left 2006    3 screws  . Total hip arthroplasty Left 04/10/2014    Procedure: LEFT TOTAL HIP ARTHROPLASTY ANTERIOR APPROACH;  Surgeon: Gearlean Alf, MD;  [facility];  Service: Orthopedics;  Laterality: Left;    There were no vitals filed for this visit.  Visit Diagnosis:  Right hip pain  Difficulty walking      Subjective Assessment - 06/19/14 1655    Subjective Not bad.  Not doing too bad.  Feeling better and stronger, walked over a 1/4 mile on Saturday and I was really sore  for the next day   Currently in Pain? No/denies                         [facility] Adult PT Treatment/Exercise - 06/19/14 0001    High Level Balance   High Level Balance Activities Side stepping;Backward walking;Tandem walking;Weight-shifting turns   Knee/Hip Exercises: Stretches   Passive Hamstring Stretch 3 reps;30 seconds   Quad Stretch 3 reps;20 seconds   Hip Flexor Stretch 20 seconds;3 reps   Piriformis Stretch 20 seconds;4 reps   Knee/Hip Exercises: Aerobic   Elliptical 3 minutes   Knee/Hip Exercises: Machines for Strengthening   Cybex Knee Extension 10# 3x10   Cybex Knee Flexion 25# 3x10   Cybex Leg Press 40# 2x10   Total Gym Leg Press NUSTEP Level 6 x 6 minutes   Knee/Hip Exercises: Standing   Walking with Sports Cord black tband fwd backward and side stepping   Other Standing Knee Exercises 5# flexion, abduction and extension   Knee/Hip Exercises: Seated   Other Seated Knee Exercises iso ball crunches   Knee/Hip Exercises: Sidelying   Other Sidelying Knee  Exercises hip flexor stretch                  PT Short Term Goals - 05/03/14 1645    PT SHORT TERM GOAL #1   Title independent with HEP   Time 2   Period Weeks   Status New           PT Long Term Goals - 05/10/14 1749    PT LONG TERM GOAL #1   Title go up and down stairs step over step   Status Partially Met   PT LONG TERM GOAL #2   Title decrease pain 50%   Status On-going   PT LONG TERM GOAL #3   Title walk without assistive device for all distances   Status On-going               Plan - 06/19/14 1746    Clinical Impression Statement Patient remains tight, I feel that we will focus on function and flexibility and look to D/C next week   PT Next Visit Plan continue to work on functional strength and gait   Consulted and Agree with Plan of Care Patient        Problem List Patient Active Problem List   Diagnosis Date Noted  . Avascular necrosis of femur head, left 04/10/2014  . OA (osteoarthritis) of hip 04/10/2014  . Painful orthopaedic hardware 10/17/2013    Sumner Boast, PT 06/19/2014, 5:48 PM  [facility] [address] [place], [place], [address] Phone: [phone]   Fax:  [phone]

## 2014-06-21 ENCOUNTER — Encounter: Payer: Medicare Other | Admitting: Physical Therapy

## 2014-06-22 ENCOUNTER — Ambulatory Visit: Payer: Medicare Other | Admitting: Physical Therapy

## 2014-06-28 ENCOUNTER — Encounter: Payer: Self-pay | Admitting: Physical Therapy

## 2014-06-28 ENCOUNTER — Ambulatory Visit: Payer: Medicare Other | Attending: Orthopedic Surgery | Admitting: Physical Therapy

## 2014-06-28 DIAGNOSIS — R262 Difficulty in walking, not elsewhere classified: Secondary | ICD-10-CM | POA: Diagnosis not present

## 2014-06-28 DIAGNOSIS — M25551 Pain in right hip: Secondary | ICD-10-CM

## 2014-06-28 NOTE — Therapy (Signed)
[facility], [place], [address] Phone: [phone]   Fax:  [phone]  Physical Therapy Treatment  Patient Details  Name: Brooke Jimenez MRN: [id number] Date of Birth: 03-27-1945 Referring Provider:  Gaynelle Arabian, MD  Encounter Date: 06/28/2014      PT End of Session - 06/28/14 1615    Visit Number 15   PT Start Time 1572   PT Stop Time 6203   PT Time Calculation (min) 60 min      Past Medical History  Diagnosis Date  . Mitral valve prolapse   . Hypothyroidism   . Anxiety   . H/O hiatal hernia   . History of melanoma     melanoma- 1986 and 1997   . PTSD (post-traumatic stress disorder) 2006  . Corneal dystrophy   . Dysphagia     chronic- please see ultrasound done 4/16 15 in [facility]   . Hearing loss     bilateral due to nerve loss. wears hearing aides  . Family history of adverse reaction to anesthesia     mother slow to wake up   . Dysrhythmia     sometimes patient has extra beats per patient   . Heart murmur     no problem   . Nocturia   . Arthritis     arthritis in hip   . Peripheral neuropathy     feet  . GERD (gastroesophageal reflux disease)     under control  . Esophageal dysmotility   . Low back pain with sciatica   . DDD (degenerative disc disease), cervical     and lower back  . Glaucoma     "suspect"  . Cataract     beginning  . Varicose veins   . Spider veins   . Diverticulosis   . Osteoporosis   . Urinary incontinence     occasional  . Gout     one finger x1 attack  . Measles     hx of  . History of chicken pox   . Acne rosacea   . Complication of anesthesia     "really sore throat"  . PONV (postoperative nausea and vomiting)   . RSD (reflex sympathetic dystrophy) 1993    Past Surgical History  Procedure Laterality Date  . Melanoma surgery   1986, 1997  . Hardware removal Left 10/17/2013    Procedure: HARDWARE REMOVAL LEFT HIP;  Surgeon: Gearlean Alf, MD;  [facility]: [facility];  Service: Orthopedics;  Laterality: Left;  . Umbilical hernia repair N/A 02/08/2014    Procedure: HERNIA REPAIR UMBILICAL ADULT WITH MESH;  Surgeon: Jackolyn Confer, MD;  [facility]: [facility];  Service: General;  Laterality: N/A;  . Insertion of mesh  02/08/2014    Procedure: INSERTION OF MESH;  Surgeon: Jackolyn Confer, MD;  [facility];  Service: General;;  . Hip fracture surgery Left 2006    3 screws  . Total hip arthroplasty Left 04/10/2014    Procedure: LEFT TOTAL HIP ARTHROPLASTY ANTERIOR APPROACH;  Surgeon: Gearlean Alf, MD;  [facility];  Service: Orthopedics;  Laterality: Left;    There were no vitals filed for this visit.  Visit Diagnosis:  Difficulty walking - Plan: PT plan of care cert/re-cert  Right hip pain - Plan: PT plan of care cert/re-cert      Subjective Assessment - 06/28/14 1458    Subjective I am doing better, I have less pain.  Saw  the MD and he is very pleased, I do not go back to him for over 6 months   Currently in Pain? No/denies                         [facility] Adult PT Treatment/Exercise - Jun 29, 2014 0001    Ambulation/Gait   Ambulation/Gait Assistance Details 750   Gait Comments minimal cues for step length and the antalgic gait    High Level Balance   High Level Balance Activities Side stepping;Backward walking;Tandem walking;Weight-shifting turns   Knee/Hip Exercises: Stretches   Passive Hamstring Stretch 3 reps;30 seconds   Quad Stretch 3 reps;20 seconds   Hip Flexor Stretch 20 seconds;3 reps   Piriformis Stretch 20 seconds;4 reps   Knee/Hip Exercises: Aerobic   Elliptical 4 minutes   Knee/Hip Exercises: Machines for Strengthening   Total Gym Leg Press NUSTEP Level 6 x 6 minutes   Knee/Hip Exercises: Standing   Other Standing Knee Exercises 5# flexion, abduction and extension                PT Education - 29-Jun-2014 1614    Education provided Yes   Education Details reviewed all  gym and HEP, safety, weights, reps and sets, how to add and what to be aware of   Person(s) Educated Patient   Methods Explanation;Demonstration   Comprehension Verbalized understanding       All Goals met. PT will discharge           G-Codes - 29-Jun-2014 1725    Functional Assessment Tool Used FOTO   Functional Limitation Mobility: Walking and moving around   Mobility: Walking and Moving Around Current Status [phone]) At least 20 percent but less than 40 percent impaired, limited or restricted   Mobility: Walking and Moving Around Goal Status [phone]) At least 40 percent but less than 60 percent impaired, limited or restricted   Mobility: Walking and Moving Around Discharge Status [phone]) At least 20 percent but less than 40 percent impaired, limited or restricted      Problem List Patient Active Problem List   Diagnosis Date Noted  . Avascular necrosis of femur head, left 04/10/2014  . OA (osteoarthritis) of hip 04/10/2014  . Painful orthopaedic hardware 10/17/2013    Sumner Boast, PT Jun 29, 2014, 5:27 PM  [place], [place], [address] Phone: [phone]   Fax:  [phone]

## 2014-09-09 ENCOUNTER — Emergency Department (INDEPENDENT_AMBULATORY_CARE_PROVIDER_SITE_OTHER)
Admission: EM | Admit: 2014-09-09 | Discharge: 2014-09-09 | Disposition: A | Discharge status: Home / Self Care | Payer: Medicare Other | Source: Home / Self Care | Attending: Emergency Medicine | Admitting: Emergency Medicine

## 2014-09-09 ENCOUNTER — Encounter (HOSPITAL_COMMUNITY): Payer: Self-pay | Admitting: Emergency Medicine

## 2014-09-09 DIAGNOSIS — R21 Rash and other nonspecific skin eruption: Secondary | ICD-10-CM | POA: Diagnosis not present

## 2014-09-09 MED ORDER — DOXYCYCLINE HYCLATE 100 MG PO CAPS: 100.0000 mg | ORAL_CAPSULE | Freq: Two times a day (BID) | ORAL | Status: DC

## 2014-09-09 NOTE — ED Notes (Signed)
C/o rash on right thigh onset this am that she noticed when shaving... Rash is subsiding Denies fevers, chills Taking Minocycline Alert, no signs of acute distress

## 2014-09-09 NOTE — Discharge Instructions (Signed)
With the rash and fatigue and joint aches, we are going to treat you presumptively for Lyme disease. Take doxycycline 1 pill twice a day for 10 days. Do not take the minocycline while you are taking doxycycline. Follow-up with your primary care doctor in 2 weeks for recheck.

## 2014-09-09 NOTE — ED Provider Notes (Signed)
CSN: [id number]     Arrival date & time 09/09/14  1359 History   First MD Initiated Contact with Patient 09/09/14 1500     Chief Complaint  Patient presents with  . Rash   (Consider location/radiation/quality/duration/timing/severity/associated sxs/prior Treatment) HPI  She is a 69 year old woman here for evaluation of rash. She states she was shaving her legs in the shower this morning when she noticed a circular red rash on her right side. There is central clearing. It has gradually improved over the course of the day. She denies any itching. No known tick bite, but she was working in the yard of an abandoned house 5 days ago. She also reports increased fatigue and joint aches over the last several days. No fevers or chills.  She does take minocycline 50 mg twice a day for acne rosacea.  Past Medical History  Diagnosis Date  . Mitral valve prolapse   . Hypothyroidism   . Anxiety   . H/O hiatal hernia   . History of melanoma     melanoma- 1986 and 1997   . PTSD (post-traumatic stress disorder) 2006  . Corneal dystrophy   . Dysphagia     chronic- please see ultrasound done 4/16 15 in [facility]   . Hearing loss     bilateral due to nerve loss. wears hearing aides  . Family history of adverse reaction to anesthesia     mother slow to wake up   . Dysrhythmia     sometimes patient has extra beats per patient   . Heart murmur     no problem   . Nocturia   . Arthritis     arthritis in hip   . Peripheral neuropathy     feet  . GERD (gastroesophageal reflux disease)     under control  . Esophageal dysmotility   . Low back pain with sciatica   . DDD (degenerative disc disease), cervical     and lower back  . Glaucoma     "suspect"  . Cataract     beginning  . Varicose veins   . Spider veins   . Diverticulosis   . Osteoporosis   . Urinary incontinence     occasional  . Gout     one finger x1 attack  . Measles     hx of  . History of chicken pox   . Acne rosacea   .  Complication of anesthesia     "really sore throat"  . PONV (postoperative nausea and vomiting)   . RSD (reflex sympathetic dystrophy) 1993   Past Surgical History  Procedure Laterality Date  . Melanoma surgery   1986, 1997  . Hardware removal Left 10/17/2013    Procedure: HARDWARE REMOVAL LEFT HIP;  Surgeon: Gearlean Alf, MD;  [facility];  Service: Orthopedics;  Laterality: Left;  . Umbilical hernia repair N/A 02/08/2014    Procedure: HERNIA REPAIR UMBILICAL ADULT WITH MESH;  Surgeon: Jackolyn Confer, MD;  [facility];  Service: General;  Laterality: N/A;  . Insertion of mesh  02/08/2014    Procedure: INSERTION OF MESH;  Surgeon: Jackolyn Confer, MD;  [facility];  Service: General;;  . Hip fracture surgery Left 2006    3 screws  . Total hip arthroplasty Left 04/10/2014    Procedure: LEFT TOTAL HIP ARTHROPLASTY ANTERIOR APPROACH;  Surgeon: Gearlean Alf, MD;  [facility];  Service: Orthopedics;  Laterality: Left;   Family History  Problem Relation Age of  Onset  . Cancer Mother   . Heart failure Father    History  Substance Use Topics  . Smoking status: Never Smoker   . Smokeless tobacco: Never Used  . Alcohol Use: No   OB History    No data available     Review of Systems As in history of present illness Allergies  Codeine; Doxycycline; Epinephrine; Erythromycin; Levaquin; Adhesive; Penicillins; and Sulfa antibiotics  Home Medications   Prior to Admission medications   Medication Sig Start Date End Date Taking? Authorizing Provider  clindamycin (CLEOCIN) 150 MG capsule Take 600 mg by mouth. One hour before dental appointments   Yes Historical Provider, MD  levothyroxine (SYNTHROID, LEVOTHROID) 50 MCG tablet Take 50 mcg by mouth daily before breakfast.   Yes Historical Provider, MD  minocycline (MINOCIN,DYNACIN) 50 MG capsule Take 50 mg by mouth 2 (two) times daily.   Yes Historical Provider, MD  acetaminophen (TYLENOL) 500 MG tablet Take 500 mg  by mouth every 6 (six) hours as needed for mild pain.    Historical Provider, MD  Docusate Sodium (COLACE PO) Take 1 tablet by mouth 2 (two) times daily.     Historical Provider, MD  doxycycline (VIBRAMYCIN) 100 MG capsule Take 1 capsule (100 mg total) by mouth 2 (two) times daily. 09/09/14   Melony Overly, MD  HYDROmorphone (DILAUDID) 2 MG tablet Take 1-2 tablets (2-4 mg total) by mouth every 4 (four) hours as needed for moderate pain or severe pain. 04/11/14   Arlee Muslim, PA-C  ibuprofen (ADVIL,MOTRIN) 200 MG tablet Take 200 mg by mouth 2 (two) times daily as needed for headache or moderate pain.    Historical Provider, MD  Magnesium 250 MG TABS Take 250 mg by mouth daily.     Historical Provider, MD  methocarbamol (ROBAXIN) 500 MG tablet Take 1 tablet (500 mg total) by mouth every 6 (six) hours as needed for muscle spasms. Patient not taking: Reported on 05/30/2014 04/11/14   Arlee Muslim, PA-C  Multiple Vitamin (MULTIVITAMIN WITH MINERALS) TABS tablet Take 1 tablet by mouth daily.    Historical Provider, MD  ondansetron (ZOFRAN) 4 MG tablet Take 1 tablet (4 mg total) by mouth every 4 (four) hours as needed for nausea. Patient not taking: Reported on 05/30/2014 04/12/14   Arlee Muslim, PA-C  rivaroxaban (XARELTO) 10 MG TABS tablet Take 1 tablet (10 mg total) by mouth daily with breakfast. Take Xarelto for two and a half more weeks, then discontinue Xarelto. Once the patient has completed the blood thinner regimen, then take a Baby 81 mg Aspirin daily for three more weeks. Patient not taking: Reported on 05/30/2014 04/11/14   Arlee Muslim, PA-C  sodium chloride (MURO 128) 5 % ophthalmic ointment Place 1 application into both eyes at bedtime.    Historical Provider, MD  traMADol (ULTRAM) 50 MG tablet Take 1-2 tablets (50-100 mg total) by mouth every 6 (six) hours as needed (mild pain). Patient not taking: Reported on 05/30/2014 04/11/14   Arlee Muslim, PA-C   BP 149/73 mmHg  Pulse 79  Temp(Src) 98.1 F (36.7  C) (Oral)  Resp 16  SpO2 97% Physical Exam  Constitutional: She is oriented to person, place, and time. She appears well-developed and well-nourished. No distress.  Cardiovascular: Normal rate.   Pulmonary/Chest: Effort normal.  Neurological: She is alert and oriented to person, place, and time.  Skin:  Faint erythema on right anterior thigh. No visualized tick.    ED Course  Procedures (including critical care  time) Labs Review Labs Reviewed  B. BURGDORFI ANTIBODIES    Imaging Review No results found.   MDM   1. Rash    Will check Lyme titers. Given rash with associated fatigue and joint aches, will treat presumptively with doxycycline 100 mg twice a day for 10 days. Recommended follow-up with PCP in 2 weeks for recheck.    Melony Overly, MD 09/09/14 [phone]

## 2014-09-10 LAB — B. BURGDORFI ANTIBODIES: B burgdorferi Ab IgG+IgM: <0.91 {ISR} (ref 0.00–0.90)

## 2014-09-15 NOTE — ED Notes (Signed)
Final report of titer negative/non-suggestive of diseases. Left message on patient's VM

## 2014-09-21 NOTE — ED Notes (Signed)
Patient called to discuss lab findings

## 2014-10-24 IMAGING — CR DG CHEST 2V
2 series · 2 of 2 positions shown · non-contrast
Comparison: None.

CLINICAL DATA: Preop.  Left hip orthopedic hardware rib.

EXAM:
CHEST  2 VIEW

[w chest pa]
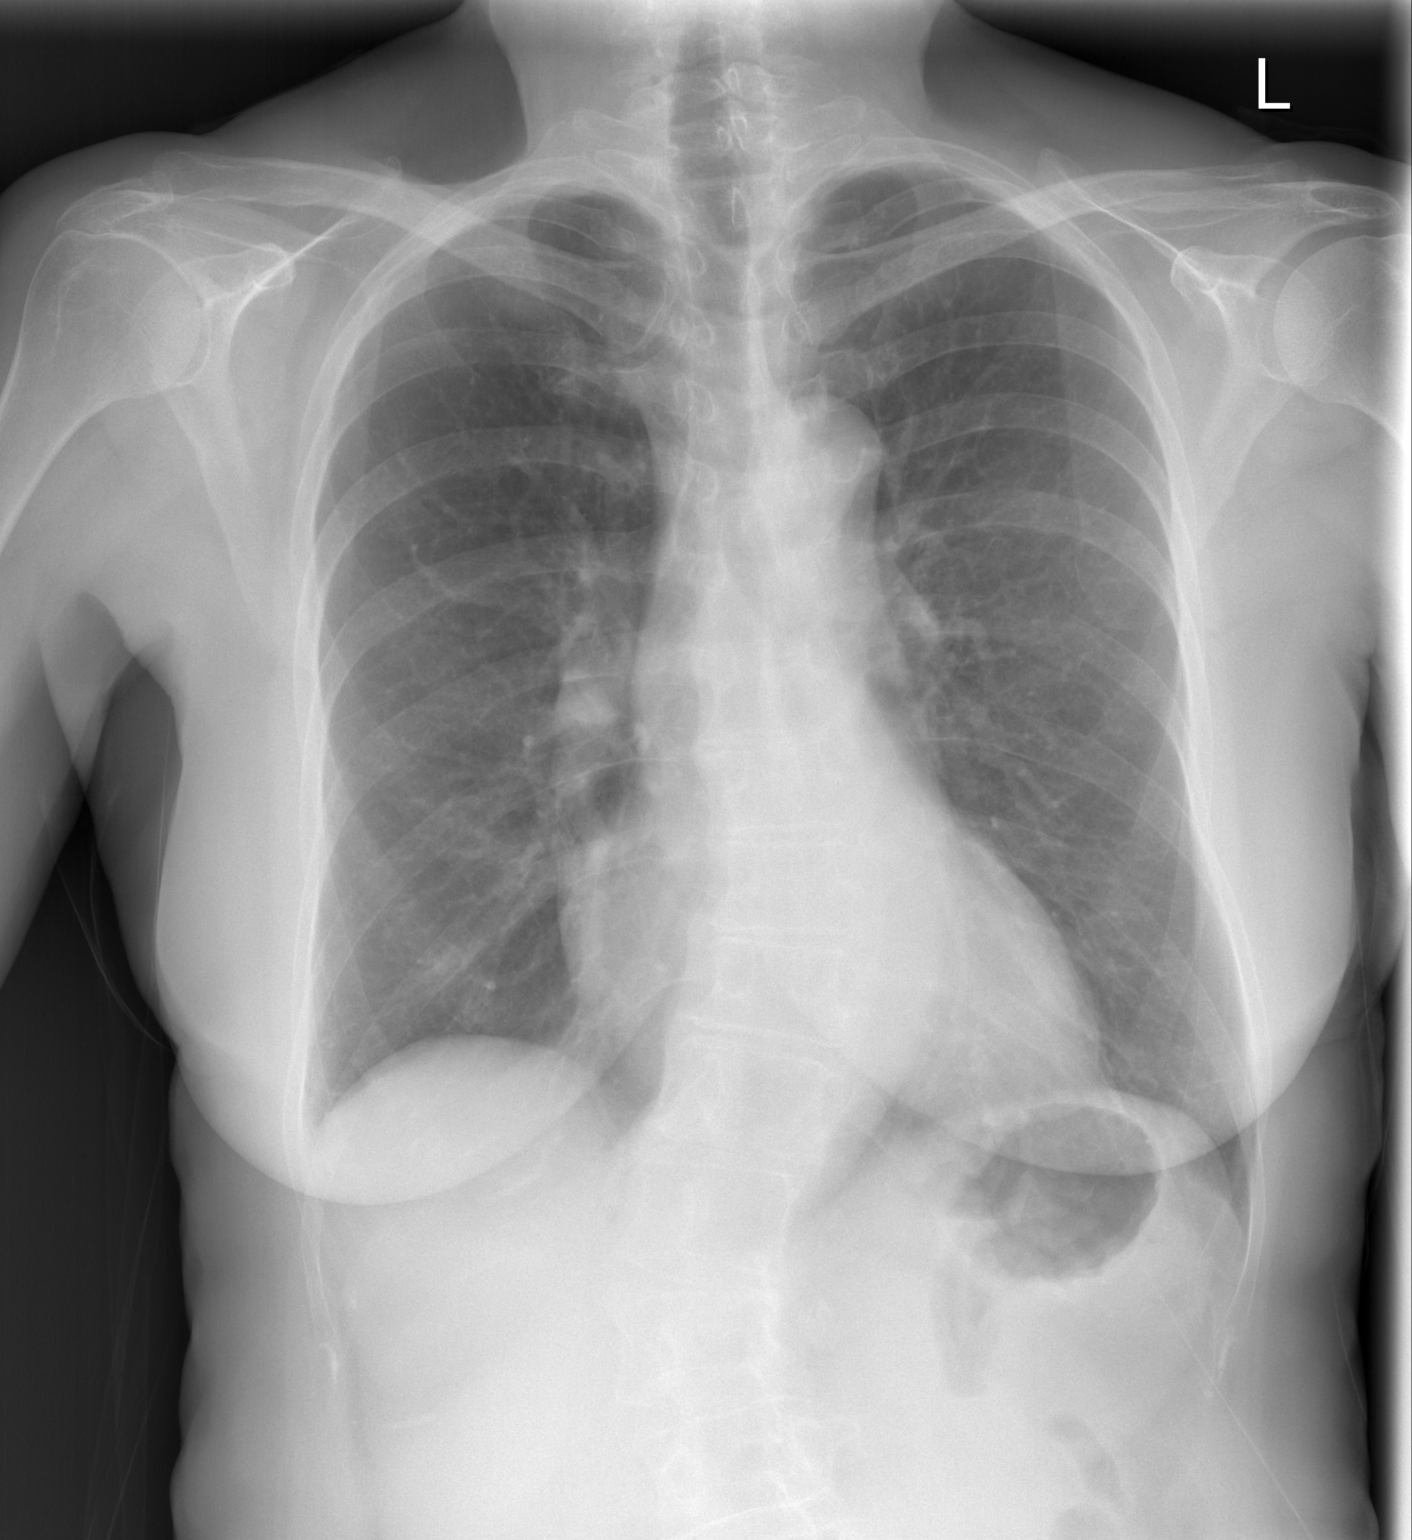

[w chest lat]
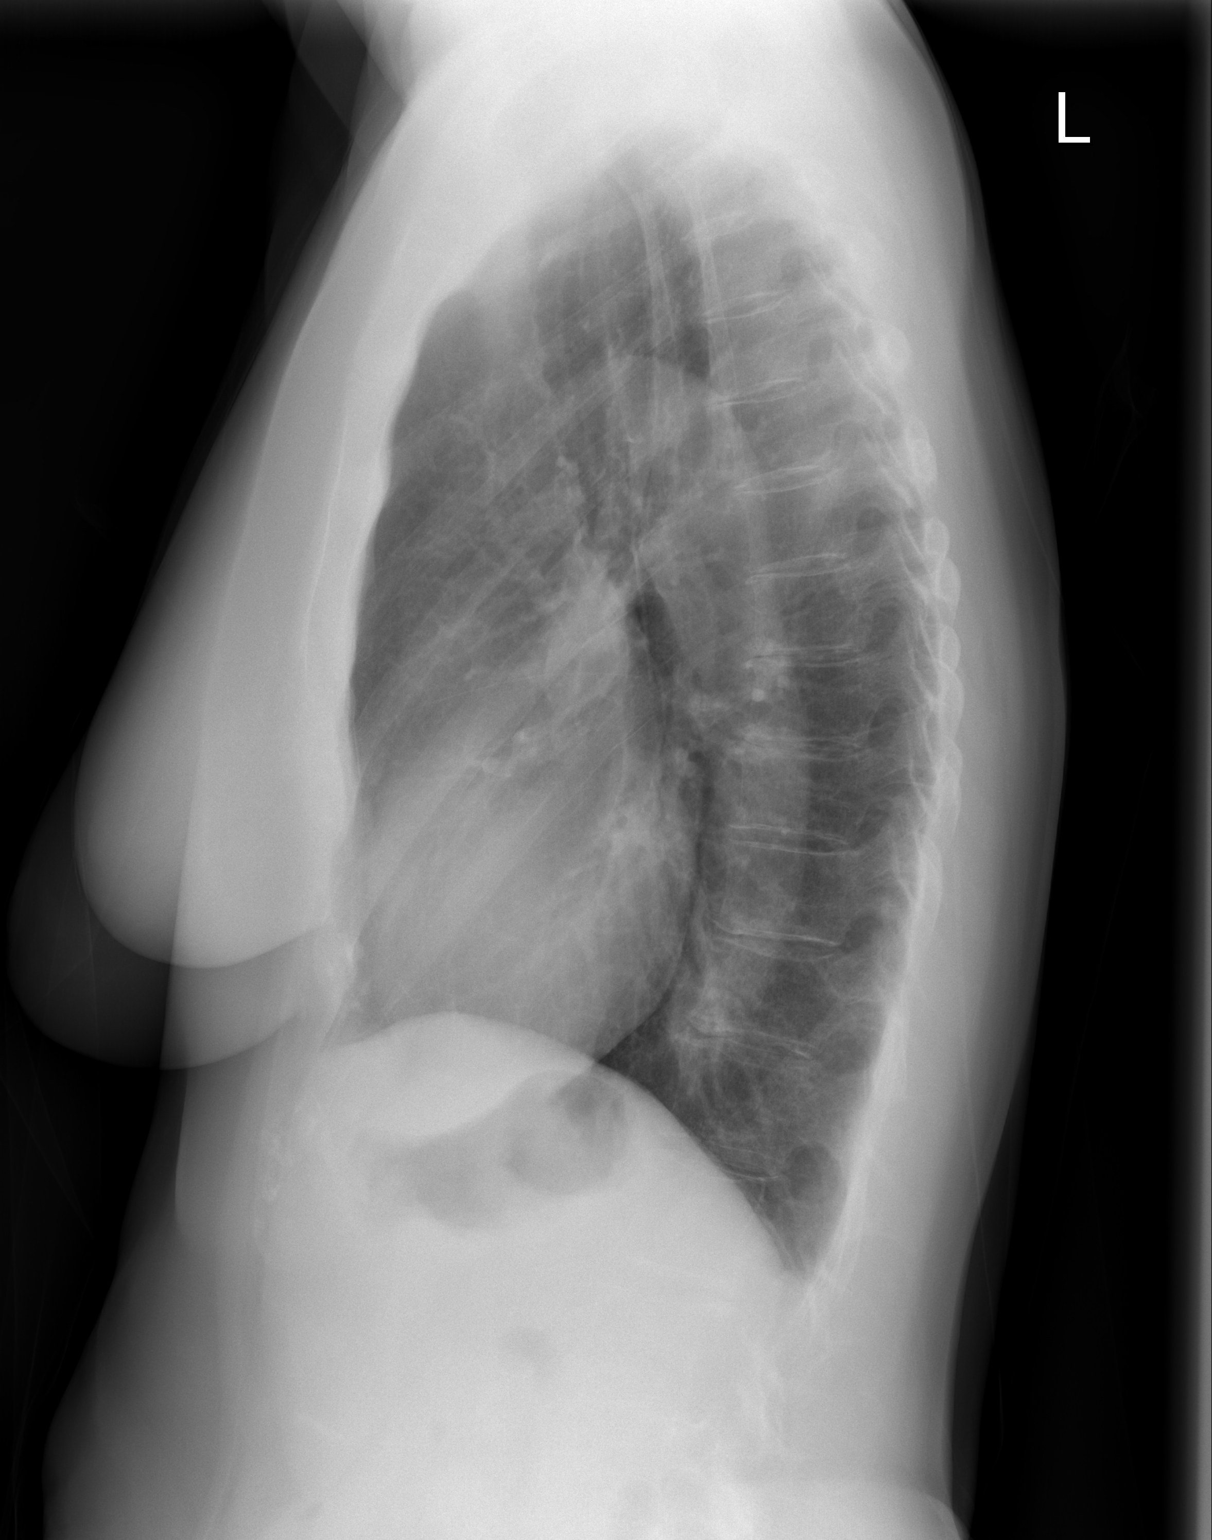

[2 of 2 positions shown; findings below may reference images not displayed]

FINDINGS: Cardiac silhouette is normal in size. Aorta is mildly tortuous. No
mediastinal or hilar masses. No evidence of adenopathy.

Clear lungs.  No pleural effusion or pneumothorax.

Bony thorax is demineralized. There is a scoliosis of the lower
thoracic and upper lumbar spine.
IMPRESSION: No active cardiopulmonary disease.

## 2015-04-02 HISTORY — PX: PARATHYROIDECTOMY: SHX19

## 2015-04-27 ENCOUNTER — Encounter (HOSPITAL_COMMUNITY): Payer: Self-pay

## 2015-04-27 ENCOUNTER — Encounter (HOSPITAL_COMMUNITY)
Admission: RE | Admit: 2015-04-27 | Discharge: 2015-04-27 | Disposition: A | Discharge status: Home / Self Care | Payer: Medicare Other | Source: Ambulatory Visit | Attending: Pulmonary Disease | Admitting: Pulmonary Disease

## 2015-04-27 VITALS — BP 124/70 | HR 85 | Resp 18 | Ht 65.0 in | Wt 172.2 lb

## 2015-04-27 DIAGNOSIS — J471 Bronchiectasis with (acute) exacerbation: Secondary | ICD-10-CM

## 2015-04-27 NOTE — Progress Notes (Signed)
Pulmonary Individual Treatment Plan  Patient Details  Name: Brooke Jimenez MRN: [id number] Date of Birth: November 24, 1945 Referring Provider:  Karleen Hampshire., MD  Initial Encounter Date:   Visit Diagnosis: Bronchiectasis with acute exacerbation [facility])  Patient's Home Medications on Admission:   Current outpatient prescriptions:  .  acetaminophen (TYLENOL) 500 MG tablet, Take 500 mg by mouth every 6 (six) hours as needed for mild pain., Disp: , Rfl:  .  B Complex Vitamins (VITAMIN-B COMPLEX PO), Take 1 tablet by mouth daily., Disp: , Rfl:  .  calcium carbonate (TUMS - DOSED IN MG ELEMENTAL CALCIUM) 500 MG chewable tablet, Chew 3 tablets by mouth daily., Disp: , Rfl:  .  clindamycin (CLEOCIN) 150 MG capsule, Take 600 mg by mouth. One hour before dental appointments, Disp: , Rfl:  .  ibuprofen (ADVIL,MOTRIN) 200 MG tablet, Take 200 mg by mouth 2 (two) times daily as needed for headache or moderate pain., Disp: , Rfl:  .  levothyroxine (SYNTHROID, LEVOTHROID) 50 MCG tablet, Take 50 mcg by mouth daily before breakfast., Disp: , Rfl:  .  Lutein 20 MG TABS, Take 1 tablet by mouth daily., Disp: , Rfl:  .  Magnesium 250 MG TABS, Take 250 mg by mouth daily. , Disp: , Rfl:  .  minocycline (MINOCIN,DYNACIN) 50 MG capsule, Take 50 mg by mouth 2 (two) times daily., Disp: , Rfl:  .  Multiple Vitamin (MULTIVITAMIN WITH MINERALS) TABS tablet, Take 1 tablet by mouth daily., Disp: , Rfl:   Past Medical History: Past Medical History  Diagnosis Date  . Mitral valve prolapse   . Hypothyroidism   . Anxiety   . H/O hiatal hernia   . History of melanoma     melanoma- 1986 and 1997   . PTSD (post-traumatic stress disorder) 2006  . Corneal dystrophy   . Dysphagia     chronic- please see ultrasound done 4/16 15 in [facility]   . Hearing loss     bilateral due to nerve loss. wears hearing aides  . Family history of adverse reaction to anesthesia     mother slow to wake up   . Dysrhythmia     sometimes patient has  extra beats per patient   . Heart murmur     no problem   . Nocturia   . Arthritis     arthritis in hip   . Peripheral neuropathy (HCC)     feet  . GERD (gastroesophageal reflux disease)     under control  . Esophageal dysmotility   . Low back pain with sciatica   . DDD (degenerative disc disease), cervical     and lower back  . Glaucoma     "suspect"  . Cataract     beginning  . Varicose veins   . Spider veins   . Diverticulosis   . Osteoporosis   . Urinary incontinence     occasional  . Gout     one finger x1 attack  . Measles     hx of  . History of chicken pox   . Acne rosacea   . Complication of anesthesia     "really sore throat"  . PONV (postoperative nausea and vomiting)   . RSD (reflex sympathetic dystrophy) 1993    Tobacco Use: History  Smoking status  . Never Smoker   Smokeless tobacco  . Never Used    Labs:     Recent Review Flowsheet Data    There is no flowsheet data to  display.      Capillary Blood Glucose: No results found for: GLUCAP   ADL UCSD:   Pulmonary Function Assessment:     Pulmonary Function Assessment - 04/27/15 1512    Breath   Bilateral Breath Sounds Clear   Shortness of Breath Yes;Limiting activity      Exercise Target Goals:    Exercise Program Goal: Individual exercise prescription set with THRR, safety & activity barriers. Participant demonstrates ability to understand and report RPE using BORG scale, to self-measure pulse accurately, and to acknowledge the importance of the exercise prescription.  Exercise Prescription Goal: Starting with aerobic activity 30 plus minutes a day, 3 days per week for initial exercise prescription. Provide home exercise prescription and guidelines that participant acknowledges understanding prior to discharge.  Activity Barriers & Risk Stratification:     Activity Barriers & Cardiac Risk Stratification - 04/27/15 1511    Activity Barriers & Cardiac Risk Stratification    Activity Barriers Deconditioning;Left Hip Replacement;Shortness of Breath      6 Minute Walk:     6 Minute Walk      05/01/15 1628       6 Minute Walk   Phase Initial     Distance 1408 feet     Walk Time 6 minutes     # of Rest Breaks 0     MPH 2.7     METS 3.12     RPE 13     Perceived Dyspnea  2     VO2 Peak 11.1     Symptoms No     Resting HR 75 bpm     Resting BP 118/72 mmHg     Max Ex. HR 111 bpm     Max Ex. BP 144/86 mmHg     Pre/Post BP   Baseline BP 118/72 mmHg     6 Minute BP 144/86 mmHg     2 Minute Post BP 114/80 mmHg     Pre/Post BP? Yes     Interval HR   Baseline HR 75     1 Minute HR 106     2 Minute HR 109     3 Minute HR 107     4 Minute HR 108     5 Minute HR 111     6 Minute HR 110     2 Minute Post HR 85     Interval Heart Rate? Yes     Interval Oxygen   Interval Oxygen? Yes     Baseline Oxygen Saturation % 98 %     Baseline Liters of Oxygen 0 L  Room Air     1 Minute Oxygen Saturation % 98 %     1 Minute Liters of Oxygen 0 L     2 Minute Oxygen Saturation % 98 %     2 Minute Liters of Oxygen 0 L     3 Minute Oxygen Saturation % 98 %     3 Minute Liters of Oxygen 0 L     4 Minute Oxygen Saturation % 98 %     4 Minute Liters of Oxygen 0 L     5 Minute Oxygen Saturation % 98 %     5 Minute Liters of Oxygen 0 L     6 Minute Oxygen Saturation % 98 %     6 Minute Liters of Oxygen 0 L     2 Minute Post Oxygen Saturation % 100 %     2 Minute  Post Liters of Oxygen 0 L        Initial Exercise Prescription:     Initial Exercise Prescription - 05/01/15 1600    Date of Initial Exercise Prescription   Date 05/01/15   Oxygen   Oxygen --  Room Air   Bike   Level 0.9   Minutes 15   NuStep   Level 1   Minutes 15   METs 2   Track   Laps 8   Minutes 15   Prescription Details   Frequency (times per week) 2   Duration Progress to 45 minutes of aerobic exercise without signs/symptoms of physical distress   Intensity   THRR 40-80% of  Max Heartrate 60-120   Ratings of Perceived Exertion 11-13   Perceived Dyspnea 0-4   Progression   Progression Continue to progress workloads to maintain intensity without signs/symptoms of physical distress.   Resistance Training   Training Prescription Yes   Weight Orange Bands   Reps 10-12      Perform Capillary Blood Glucose checks as needed.  Exercise Prescription Changes:   Discharge Exercise Prescription (Final Exercise Prescription Changes):    Nutrition:  Target Goals: Understanding of nutrition guidelines, daily intake of sodium 1500mg , cholesterol 200mg , calories 30% from fat and 7% or less from saturated fats, daily to have 5 or more servings of fruits and vegetables.  Biometrics:     Pre Biometrics - 04/27/15 1610    Pre Biometrics   Grip Strength --       Nutrition Therapy Plan and Nutrition Goals:   Nutrition Discharge: Rate Your Plate Scores:   Psychosocial: Target Goals: Acknowledge presence or absence of depression, maximize coping skills, provide positive support system. Participant is able to verbalize types and ability to use techniques and skills needed for reducing stress and depression.  Initial Review & Psychosocial Screening:     Initial Psych Review & Screening - 04/27/15 1531    Screening Interventions   Interventions --      Quality of Life Scores:   PHQ-9:     Recent Review Flowsheet Data    Depression screen [facility] 2/9 04/27/2015   Decreased Interest 0   Down, Depressed, Hopeless 0   PHQ - 2 Score 0      Psychosocial Evaluation and Intervention:     Psychosocial Evaluation - 04/27/15 1532    Psychosocial Evaluation & Interventions   Interventions Encouraged to exercise with the program and follow exercise prescription   Comments Patient encouraged to exercise for mental and physical health to assist with a better quality of sleep.      Psychosocial Re-Evaluation:  Education: Education Goals: Education classes  will be provided on a weekly basis, covering required topics. Participant will state understanding/return demonstration of topics presented.  Learning Barriers/Preferences:     Learning Barriers/Preferences - 04/27/15 1512    Learning Barriers/Preferences   Learning Barriers None   Learning Preferences Written Material      Education Topics: Risk Factor Reduction:  -Group instruction that is supported by a PowerPoint presentation. Instructor discusses the definition of a risk factor, different risk factors for pulmonary disease, and how the heart and lungs work together.     Nutrition for Pulmonary Patient:  -Group instruction provided by PowerPoint slides, verbal discussion, and written materials to support subject matter. The instructor gives an explanation and review of healthy diet recommendations, which includes a discussion on weight management, recommendations for fruit and vegetable consumption, as well as protein, fluid,  caffeine, fiber, sodium, sugar, and alcohol. Tips for eating when patients are short of breath are discussed.   Pursed Lip Breathing:  -Group instruction that is supported by demonstration and informational handouts. Instructor discusses the benefits of pursed lip and diaphragmatic breathing and detailed demonstration on how to preform both.     Oxygen Safety:  -Group instruction provided by PowerPoint, verbal discussion, and written material to support subject matter. There is an overview of "What is Oxygen" and "Why do we need it".  Instructor also reviews how to create a safe environment for oxygen use, the importance of using oxygen as prescribed, and the risks of noncompliance. There is a brief discussion on traveling with oxygen and resources the patient may utilize.   Oxygen Equipment:  -Group instruction provided by [facility] Staff utilizing handouts, written materials, and equipment demonstrations.   Signs and Symptoms:  -Group instruction  provided by written material and verbal discussion to support subject matter. Warning signs and symptoms of infection, stroke, and heart attack are reviewed and when to call the physician/911 reinforced. Tips for preventing the spread of infection discussed.   Advanced Directives:  -Group instruction provided by verbal instruction and written material to support subject matter. Instructor reviews Advanced Directive laws and proper instruction for filling out document.   Pulmonary Video:  -Group video education that reviews the importance of medication and oxygen compliance, exercise, good nutrition, pulmonary hygiene, and pursed lip and diaphragmatic breathing for the pulmonary patient.   Exercise for the Pulmonary Patient:  -Group instruction that is supported by a PowerPoint presentation. Instructor discusses benefits of exercise, core components of exercise, frequency, duration, and intensity of an exercise routine, importance of utilizing pulse oximetry during exercise, safety while exercising, and options of places to exercise outside of rehab.     Pulmonary Medications:  -Verbally interactive group education provided by instructor with focus on inhaled medications and proper administration.   Anatomy and Physiology of the Respiratory System and Intimacy:  -Group instruction provided by PowerPoint, verbal discussion, and written material to support subject matter. Instructor reviews respiratory cycle and anatomical components of the respiratory system and their functions. Instructor also reviews differences in obstructive and restrictive respiratory diseases with examples of each. Intimacy, Sex, and Sexuality differences are reviewed with a discussion on how relationships can change when diagnosed with pulmonary disease. Common sexual concerns are reviewed.   Knowledge Questionnaire Score:   Personal Goals and Risk Factors at Admission:     Personal Goals and Risk Factors at  Admission - 04/27/15 1517    Core Components/Risk Factors/Patient Goals on Admission    Weight Management Yes   Intervention Weight Management: Develop a combined nutrition and exercise program designed to reach desired caloric intake, while maintaining appropriate intake of nutrient and fiber, sodium and fats, and appropriate energy expenditure required for the weight goal.;Weight Management: Provide education and appropriate resources to help participant work on and attain dietary goals.   Admit Weight 172 lb 2.9 oz (78.1 kg)   Expected Outcomes Short Term: Continue to assess and modify interventions until short term weight is achieved.;Long Term: Adherence to nutrition and physical activity/exercise program aimed toward attainment of established weight goal.   Sedentary Yes   Intervention Provide advice, education, support and counseling about physical activity/exercise needs.;Develop an individualized exercise prescription for aerobic and resistive training based on initial evaluation findings, risk stratification, comorbidities and participant's personal goals.   Expected Outcomes Achievement of increased cardiorespiratory fitness and enhanced flexibility, muscular endurance  and strength shown through measurements of functional capaciy and personal statement of participant.   Improve shortness of breath with ADL's Yes   Intervention Provide education, individualized exercise plan and daily activity instruction to help decrease symptoms of SOB with activities of daily living.   Expected Outcomes Short Term: Achieves a reduction of symptoms when performing activities of daily living.   Develop more efficient breathing techniques such as purse lipped breathing and diaphragmatic breathing; and practicing self-pacing with activity Yes   Intervention Provide education, demonstration and support about specific breathing techniuqes utilized for more efficient breathing. Include techniques such as pursed  lipped breathing, diaphragmatic breathing and self-pacing activity.   Expected Outcomes Short Term: Participant will be able to demonstrate and use breathing techniques as needed throughout daily activities.   Stress Yes   Personal Goal Other Yes   Personal Goal Patient hopes to improve her quality and quantity of sleep each night   Intervention help patient establish an exercise routein to induce physical and mental health, encourage patient to develop a consitant nighttime routein with a definite bedtime and awake time, encourage patient to decrease lighting in bedroom   Expected Outcomes patient reports a better nights sleep      Personal Goals and Risk Factors Review:    Personal Goals Discharge (Final Personal Goals and Risk Factors Review):    ITP Comments:   Comments:  Brooke Jimenez 70 y.o. female Pulmonary Rehab Orientation Note Patient arrived today in Cardiac and Pulmonary Rehab for orientation to Pulmonary Rehab. She ambulated from [place] parking with minimal shortness of breath. Denied rest breaks. She has not been prescribed home oxygen for use. Color good, skin warm and dry. Patient is oriented to time and place. Patient's medical history, psychosocial health, and medications reviewed. Psychosocial assessment reveals pt lives with their spouse. Pt is currently retired. Pt hobbies include reading and cooking. She hopes to be able to travel [place] once she has rebuilt her stamina and strength. Pt reports her stress level is low. Areas of stress/anxiety include Health.  Pt does not exhibit signs of depression.Pt shows good  coping skills with positive outlook . She is  offered emotional support and reassurance. Will continue to monitor and evaluate progress toward psychosocial goal(s) of remaining positive about her future health. Physical assessment reveals heart rate is normal, breath sounds clear to auscultation, no wheezes, rales, or rhonchi. Grip strength equal, strong. Distal  pulses palpable. Patient reports she does take medications as prescribed. Patient states she follows a Regular diet. The patient reports no specific efforts to gain or lose weight.. Patient's weight will be monitored closely. Demonstration and practice of PLB using pulse oximeter. Patient able to return demonstration satisfactorily. Safety and hand hygiene in the exercise area reviewed with patient. Patient voices understanding of the information reviewed. Department expectations discussed with patient and achievable goals were set. The patient shows enthusiasm about attending the program and we look forward to working with this nice lady. The patient is scheduled for a 6 min walk test on Tuesday 3/7 at 3:30 and to begin exercise on Thursday 3/9 in the 1030 class.

## 2015-05-01 ENCOUNTER — Encounter (HOSPITAL_COMMUNITY)
Admission: RE | Admit: 2015-05-01 | Discharge: 2015-05-01 | Disposition: A | Discharge status: Home / Self Care | Payer: Medicare Other | Source: Ambulatory Visit | Attending: Pulmonary Disease | Admitting: Pulmonary Disease

## 2015-05-01 DIAGNOSIS — J471 Bronchiectasis with (acute) exacerbation: Secondary | ICD-10-CM | POA: Diagnosis not present

## 2015-05-03 ENCOUNTER — Encounter (HOSPITAL_COMMUNITY)
Admission: RE | Admit: 2015-05-03 | Discharge: 2015-05-03 | Disposition: A | Discharge status: Home / Self Care | Payer: Medicare Other | Source: Ambulatory Visit | Attending: Pulmonary Disease | Admitting: Pulmonary Disease

## 2015-05-03 VITALS — Wt 174.4 lb

## 2015-05-03 DIAGNOSIS — J471 Bronchiectasis with (acute) exacerbation: Secondary | ICD-10-CM

## 2015-05-03 NOTE — Progress Notes (Signed)
Daily Session Note  Patient Details  Name: Brooke Jimenez MRN: [id number] Date of Birth: 01/22/1946 Referring Provider:  Karleen Hampshire., MD  Encounter Date: 05/03/2015  Check In:     Session Check In - 05/03/15 1027    Check-In   Location [facility]   Staff Present Rosebud Poles, RN, Luisa Hart, RN, Levie Heritage, MA, ACSM RCEP, Exercise Physiologist;Annedrea Rosezella [place], RN, [facility]   Supervising physician immediately available to respond to emergencies [facility] immediately available   Physician(s)  Dr. Waldron Labs   Medication changes reported     No   Fall or balance concerns reported    No   Warm-up and Cool-down Performed as group-led instruction   Resistance Training Performed Yes   VAD Patient? No   Pain Assessment   Currently in Pain? No/denies      Capillary Blood Glucose: No results found for this or any previous visit (from the past 24 hour(s)).      Exercise Prescription Changes - 05/03/15 1200    Exercise Review   Progression No   Response to Exercise   Blood Pressure (Admit) 108/72 mmHg   Blood Pressure (Exercise) 160/84 mmHg   Blood Pressure (Exit) 108/60 mmHg   Heart Rate (Admit) 74 bpm   Heart Rate (Exercise) 86 bpm   Heart Rate (Exit) 83 bpm   Oxygen Saturation (Admit) 94 %   Oxygen Saturation (Exercise) 96 %   Oxygen Saturation (Exit) 97 %   Rating of Perceived Exertion (Exercise) 12   Perceived Dyspnea (Exercise) 2   Duration Progress to 45 minutes of aerobic exercise without signs/symptoms of physical distress   Intensity Other (comment)   Progression   Progression --  40-80% HRR   Resistance Training   Training Prescription Yes   Weight Orange Bands   Reps 10-12   Interval Training   Interval Training No   NuStep   Level 1   Minutes 15   Track   Laps 12   Minutes 15     Goals Met:  Proper associated with RPD/PD & O2 Sat Exercise tolerated well Queuing for purse lip breathing Strength training  completed today  Goals Unmet:  Not Applicable  Comments: Service time is from 1030to 1215    Dr. Rush Farmer is Medical Director for Pulmonary Rehab at [facility].

## 2015-05-08 ENCOUNTER — Encounter (HOSPITAL_COMMUNITY)
Admission: RE | Admit: 2015-05-08 | Discharge: 2015-05-08 | Disposition: A | Discharge status: Home / Self Care | Payer: Medicare Other | Source: Ambulatory Visit | Attending: Pulmonary Disease | Admitting: Pulmonary Disease

## 2015-05-08 ENCOUNTER — Encounter (HOSPITAL_COMMUNITY): Payer: Medicare Other

## 2015-05-08 DIAGNOSIS — J471 Bronchiectasis with (acute) exacerbation: Secondary | ICD-10-CM | POA: Diagnosis not present

## 2015-05-08 NOTE — Progress Notes (Signed)
Daily Session Note  Patient Details  Name: Brooke Jimenez MRN: [id number] Date of Birth: December 07, 1945 Referring Provider:  Karleen Hampshire., MD  Encounter Date: 05/08/2015  Check In:   Capillary Blood Glucose: No results found for this or any previous visit (from the past 24 hour(s)).      Exercise Prescription Changes - 05/08/15 1600    Exercise Review   Progression Yes   Response to Exercise   Blood Pressure (Admit) 140/60 mmHg   Blood Pressure (Exercise) 154/74 mmHg   Blood Pressure (Exit) 126/84 mmHg   Heart Rate (Admit) 93 bpm   Heart Rate (Exercise) 100 bpm   Heart Rate (Exit) 86 bpm   Oxygen Saturation (Admit) 96 %   Oxygen Saturation (Exercise) 92 %   Oxygen Saturation (Exit) 97 %   Rating of Perceived Exertion (Exercise) 12   Perceived Dyspnea (Exercise) 1   Duration Progress to 45 minutes of aerobic exercise without signs/symptoms of physical distress   Intensity THRR unchanged   Progression   Progression Continue to progress workloads to maintain intensity without signs/symptoms of physical distress.   Resistance Training   Training Prescription Yes   Weight Orange Bands   Reps 10-12   Interval Training   Interval Training No   NuStep   Level 2   Minutes 15   METs 1.5   Arm Ergometer   Level 1   Minutes 15   Track   Laps 12   Minutes 15     Goals Met:  Proper associated with RPD/PD & O2 Sat Exercise tolerated well Queuing for purse lip breathing Strength training completed today  Goals Unmet:  Not Applicable  Comments: Service time is from 1330 to 1530    Dr. Rush Farmer is Medical Director for Pulmonary Rehab at [facility].

## 2015-05-10 ENCOUNTER — Encounter (HOSPITAL_COMMUNITY)
Admission: RE | Admit: 2015-05-10 | Discharge: 2015-05-10 | Disposition: A | Discharge status: Home / Self Care | Payer: Medicare Other | Source: Ambulatory Visit | Attending: Pulmonary Disease | Admitting: Pulmonary Disease

## 2015-05-10 DIAGNOSIS — J471 Bronchiectasis with (acute) exacerbation: Secondary | ICD-10-CM | POA: Diagnosis not present

## 2015-05-10 NOTE — Progress Notes (Signed)
Daily Session Note  Patient Details  Name: Alley Neils MRN: [id number] Date of Birth: [birth year] Referring Provider:  Karleen Hampshire., MD  Encounter Date: 05/10/2015  Check In:     Session Check In - 05/10/15 1056    Check-In   Location [facility]   Staff Present Rosebud Poles, RN, Fletcher Anon, M.Ed., RD, LDN, CDE, Clinical Nutritionist Lamar Sprinkles, MA, ACSM RCEP, Exercise Physiologist;Yamir Carignan Ysidro Evert, Felipe Drone, RN, MHA;Portia Rollene Rotunda, RN, BSN   Supervising physician immediately available to respond to emergencies [facility] immediately available   Physician(s) Dr. Jerilee Hoh   Medication changes reported     No   Fall or balance concerns reported    No   Warm-up and Cool-down Performed as group-led instruction   Resistance Training Performed Yes   VAD Patient? No   Pain Assessment   Currently in Pain? No/denies   Multiple Pain Sites No      Capillary Blood Glucose: No results found for this or any previous visit (from the past 24 hour(s)).      Exercise Prescription Changes - 05/10/15 1300    Exercise Review   Progression No   Response to Exercise   Blood Pressure (Admit) 116/70 mmHg   Blood Pressure (Exercise) 136/86 mmHg   Blood Pressure (Exit) 118/60 mmHg   Heart Rate (Admit) 74 bpm   Heart Rate (Exercise) 105 bpm   Heart Rate (Exit) 79 bpm   Oxygen Saturation (Admit) 94 %   Oxygen Saturation (Exercise) 98 %   Oxygen Saturation (Exit) 92 %   Rating of Perceived Exertion (Exercise) 11   Perceived Dyspnea (Exercise) 1   Duration Progress to 45 minutes of aerobic exercise without signs/symptoms of physical distress   Intensity THRR unchanged   Progression   Progression Continue to progress workloads to maintain intensity without signs/symptoms of physical distress.   Resistance Training   Training Prescription Yes   Weight orange bands   Reps 10-12   Interval Training   Interval Training No   NuStep   Level 2   Minutes 15   METs 1.7   Track   Laps 15   Minutes 15     Goals Met:  Queuing for purse lip breathing No report of cardiac concerns or symptoms Strength training completed today  Goals Unmet:  Not Applicable  Comments:Service time is from 1030 to 1240    Dr. Rush Farmer is Medical Director for Pulmonary Rehab at [facility].

## 2015-05-15 ENCOUNTER — Encounter (HOSPITAL_COMMUNITY)
Admission: RE | Admit: 2015-05-15 | Discharge: 2015-05-15 | Disposition: A | Discharge status: Home / Self Care | Payer: Medicare Other | Source: Ambulatory Visit | Attending: Pulmonary Disease | Admitting: Pulmonary Disease

## 2015-05-15 DIAGNOSIS — J471 Bronchiectasis with (acute) exacerbation: Secondary | ICD-10-CM | POA: Diagnosis not present

## 2015-05-15 NOTE — Progress Notes (Signed)
Daily Session Note  Patient Details  Name: Brooke Jimenez MRN: [id number] Date of Birth: [birth year] Referring Provider:  Karleen Hampshire., MD  Encounter Date: 05/15/2015  Check In:     Session Check In - 05/15/15 1225    Check-In   Location [facility]   Staff Present Rosebud Poles, RN, BSN;Ramon Dredge, RN, MHA;Portia Rollene Rotunda, RN, Levie Heritage, MA, ACSM RCEP, Exercise Physiologist   Supervising physician immediately available to respond to emergencies [facility] immediately available   Physician(s) Dr. Marily Memos   Fall or balance concerns reported    No   Warm-up and Cool-down Performed as group-led instruction   Resistance Training Performed Yes   VAD Patient? No   Pain Assessment   Currently in Pain? No/denies   Multiple Pain Sites No      Capillary Blood Glucose: No results found for this or any previous visit (from the past 24 hour(s)).      Exercise Prescription Changes - 05/15/15 1100    Exercise Review   Progression Yes   Response to Exercise   Blood Pressure (Admit) 120/64 mmHg   Blood Pressure (Exercise) 120/72 mmHg   Blood Pressure (Exit) 102/60 mmHg   Heart Rate (Admit) 82 bpm   Heart Rate (Exercise) 92 bpm   Heart Rate (Exit) 82 bpm   Oxygen Saturation (Admit) 96 %   Oxygen Saturation (Exercise) 94 %   Oxygen Saturation (Exit) 95 %   Rating of Perceived Exertion (Exercise) 8   Perceived Dyspnea (Exercise) 1   Symptoms none   Comments Reviewed home exercise on 05/15/15   Frequency Add 1 additional day to program exercise sessions.   Duration Progress to 45 minutes of aerobic exercise without signs/symptoms of physical distress   Intensity THRR unchanged   Progression   Progression Continue to progress workloads to maintain intensity without signs/symptoms of physical distress.   Resistance Training   Training Prescription Yes   Weight orange bands   Reps 10-12   Interval Training   Interval Training No   NuStep   Level 2   Minutes 15   METs 1.6   Arm Ergometer   Level 1   Minutes 15   Track   Laps 15   Minutes 15   Home Exercise Plan   Plans to continue exercise at [employer] (comment)  [facility] and walk at home     Goals Met:  Exercise tolerated well Personal goals reviewed  Goals Unmet:  Not Applicable  Comments: I have reviewed a Home Exercise Prescription with Brooke Jimenez . Brooke Jimenez is walking some currently exercising at home.  The patient was advised to walk 2-3   days a week for 30-45 minutes.  Brooke Jimenez and I discussed how to progress their exercise prescription.  The patient stated that their goals were to be able to walk further more easily and able to do dance aerobics.  She also wants to improve her ability to do ADLs at home.  The patient stated that they understand the exercise prescription.  We reviewed exercise guidelines, target heart rate during exercise, oxygen use, weather, home pulse oximeter, endpoints for exercise, and goals.  Patient is encouraged to come to me with any questions. I will continue to follow up with the patient to assist them with progression and safety.  [id number] Service time is from 1030 to 1215     Dr. Rush Farmer is Medical Director for Pulmonary Rehab at [facility].

## 2015-05-17 ENCOUNTER — Encounter (HOSPITAL_COMMUNITY)
Admission: RE | Admit: 2015-05-17 | Discharge: 2015-05-17 | Disposition: A | Discharge status: Home / Self Care | Payer: Medicare Other | Source: Ambulatory Visit | Attending: Pulmonary Disease | Admitting: Pulmonary Disease

## 2015-05-17 VITALS — Wt 172.6 lb

## 2015-05-17 DIAGNOSIS — J471 Bronchiectasis with (acute) exacerbation: Secondary | ICD-10-CM | POA: Diagnosis not present

## 2015-05-17 NOTE — Progress Notes (Signed)
Daily Session Note  Patient Details  Name: Brooke Jimenez MRN: [id number] Date of Birth: June 09, 1945 Referring Provider:  Karleen Hampshire., MD  Encounter Date: 05/17/2015  Check In:     Session Check In - 05/17/15 1040    Check-In   Location [facility]   Staff Present Rosebud Poles, RN, Luisa Hart, RN, BSN;Ramon Dredge, RN, MHA;Jessica Luan Pulling, MA, ACSM RCEP, Exercise Physiologist;Maria Venetia Maxon, RN, BSN   Physician(s) Dr. Waldron Labs   Medication changes reported     No   Fall or balance concerns reported    No   Warm-up and Cool-down Performed as group-led instruction   Resistance Training Performed Yes   VAD Patient? No   Pain Assessment   Currently in Pain? No/denies   Multiple Pain Sites No      Capillary Blood Glucose: No results found for this or any previous visit (from the past 24 hour(s)).      Exercise Prescription Changes - 05/17/15 1200    Exercise Review   Progression No   Response to Exercise   Blood Pressure (Admit) 114/72 mmHg   Blood Pressure (Exercise) 132/70 mmHg   Blood Pressure (Exit) 120/74 mmHg   Heart Rate (Admit) 75 bpm   Heart Rate (Exercise) 102 bpm   Heart Rate (Exit) 78 bpm   Oxygen Saturation (Admit) 98 %   Oxygen Saturation (Exercise) 96 %   Oxygen Saturation (Exit) 95 %   Rating of Perceived Exertion (Exercise) 12   Perceived Dyspnea (Exercise) 1   Frequency Add 1 additional day to program exercise sessions.   Duration Progress to 45 minutes of aerobic exercise without signs/symptoms of physical distress   Intensity THRR unchanged   Progression   Progression Continue to progress workloads to maintain intensity without signs/symptoms of physical distress.   Resistance Training   Training Prescription Yes   Weight orange bands   Reps 10-12   Interval Training   Interval Training No   NuStep   Level 2   Minutes 15   METs 2   Arm Ergometer   Level 1   Minutes 15     Goals Met:  Exercise tolerated  well Queuing for purse lip breathing Strength training completed today  Goals Unmet:  Not Applicable  Comments: Service time is from 1030 to [place] attended the meditation and Mindfullness class today. Dr. Rush Farmer is Medical Director for Pulmonary Rehab at [facility].

## 2015-05-22 ENCOUNTER — Encounter (HOSPITAL_COMMUNITY)
Admission: RE | Admit: 2015-05-22 | Discharge: 2015-05-22 | Disposition: A | Discharge status: Home / Self Care | Payer: Medicare Other | Source: Ambulatory Visit | Attending: Pulmonary Disease | Admitting: Pulmonary Disease

## 2015-05-22 VITALS — Wt 174.4 lb

## 2015-05-22 DIAGNOSIS — J471 Bronchiectasis with (acute) exacerbation: Secondary | ICD-10-CM | POA: Diagnosis not present

## 2015-05-22 NOTE — Progress Notes (Addendum)
Pulmonary Individual Treatment Plan  Patient Details  Name: Brooke Jimenez MRN: [id number] Date of Birth: [birth year] Referring Provider:  Karleen Hampshire., MD  Initial Encounter Date:       Pulmonary Rehab Walk Test from 05/01/2015 in [place]   Date  05/01/15      Visit Diagnosis: Bronchiectasis with acute exacerbation ([place])  Patient's Home Medications on Admission:   Current outpatient prescriptions:  .  acetaminophen (TYLENOL) 500 MG tablet, Take 500 mg by mouth every 6 (six) hours as needed for mild pain., Disp: , Rfl:  .  B Complex Vitamins (VITAMIN-B COMPLEX PO), Take 1 tablet by mouth daily., Disp: , Rfl:  .  calcium carbonate (TUMS - DOSED IN MG ELEMENTAL CALCIUM) 500 MG chewable tablet, Chew 3 tablets by mouth daily., Disp: , Rfl:  .  clindamycin (CLEOCIN) 150 MG capsule, Take 600 mg by mouth. One hour before dental appointments, Disp: , Rfl:  .  ibuprofen (ADVIL,MOTRIN) 200 MG tablet, Take 200 mg by mouth 2 (two) times daily as needed for headache or moderate pain., Disp: , Rfl:  .  levothyroxine (SYNTHROID, LEVOTHROID) 50 MCG tablet, Take 50 mcg by mouth daily before breakfast., Disp: , Rfl:  .  Lutein 20 MG TABS, Take 1 tablet by mouth daily., Disp: , Rfl:  .  Magnesium 250 MG TABS, Take 250 mg by mouth daily. , Disp: , Rfl:  .  minocycline (MINOCIN,DYNACIN) 50 MG capsule, Take 50 mg by mouth 2 (two) times daily., Disp: , Rfl:  .  Multiple Vitamin (MULTIVITAMIN WITH MINERALS) TABS tablet, Take 1 tablet by mouth daily., Disp: , Rfl:   Past Medical History: Past Medical History  Diagnosis Date  . Mitral valve prolapse   . Hypothyroidism   . Anxiety   . H/O hiatal hernia   . History of melanoma     melanoma- 1986 and 1997   . PTSD (post-traumatic stress disorder) 2006  . Corneal dystrophy   . Dysphagia     chronic- please see ultrasound done 4/16 15 in [facility]   . Hearing loss     bilateral due to nerve loss. wears hearing aides  . Family  history of adverse reaction to anesthesia     mother slow to wake up   . Dysrhythmia     sometimes patient has extra beats per patient   . Heart murmur     no problem   . Nocturia   . Arthritis     arthritis in hip   . Peripheral neuropathy (HCC)     feet  . GERD (gastroesophageal reflux disease)     under control  . Esophageal dysmotility   . Low back pain with sciatica   . DDD (degenerative disc disease), cervical     and lower back  . Glaucoma     "suspect"  . Cataract     beginning  . Varicose veins   . Spider veins   . Diverticulosis   . Osteoporosis   . Urinary incontinence     occasional  . Gout     one finger x1 attack  . Measles     hx of  . History of chicken pox   . Acne rosacea   . Complication of anesthesia     "really sore throat"  . PONV (postoperative nausea and vomiting)   . RSD (reflex sympathetic dystrophy) 1993    Tobacco Use: History  Smoking status  . Never Smoker  Smokeless tobacco  . Never Used    Labs: Recent Review Flowsheet Data    There is no flowsheet data to display.      Capillary Blood Glucose: No results found for: GLUCAP   ADL UCSD:     Pulmonary Assessment Scores      05/10/15 1443       ADL UCSD   SOB Score total 9        Pulmonary Function Assessment:     Pulmonary Function Assessment - 04/27/15 1512    Breath   Bilateral Breath Sounds Clear   Shortness of Breath Yes;Limiting activity      Exercise Target Goals:    Exercise Program Goal: Individual exercise prescription set with THRR, safety & activity barriers. Participant demonstrates ability to understand and report RPE using BORG scale, to self-measure pulse accurately, and to acknowledge the importance of the exercise prescription.  Exercise Prescription Goal: Starting with aerobic activity 30 plus minutes a day, 3 days per week for initial exercise prescription. Provide home exercise prescription and guidelines that participant  acknowledges understanding prior to discharge.  Activity Barriers & Risk Stratification:     Activity Barriers & Cardiac Risk Stratification - 04/27/15 1511    Activity Barriers & Cardiac Risk Stratification   Activity Barriers Deconditioning;Left Hip Replacement;Shortness of Breath      6 Minute Walk:     6 Minute Walk      05/01/15 1628       6 Minute Walk   Phase Initial     Distance 1408 feet     Walk Time 6 minutes     # of Rest Breaks 0     MPH 2.7     METS 3.12     RPE 13     Perceived Dyspnea  2     VO2 Peak 11.1     Symptoms No     Resting HR 75 bpm     Resting BP 118/72 mmHg     Max Ex. HR 111 bpm     Max Ex. BP 144/86 mmHg     2 Minute Post BP 114/80 mmHg     Interval HR   Baseline HR 75     1 Minute HR 106     2 Minute HR 109     3 Minute HR 107     4 Minute HR 108     5 Minute HR 111     6 Minute HR 110     2 Minute Post HR 85     Interval Heart Rate? Yes     Interval Oxygen   Interval Oxygen? Yes     Baseline Oxygen Saturation % 98 %     Baseline Liters of Oxygen 0 L  Room Air     1 Minute Oxygen Saturation % 98 %     1 Minute Liters of Oxygen 0 L     2 Minute Oxygen Saturation % 98 %     2 Minute Liters of Oxygen 0 L     3 Minute Oxygen Saturation % 98 %     3 Minute Liters of Oxygen 0 L     4 Minute Oxygen Saturation % 98 %     4 Minute Liters of Oxygen 0 L     5 Minute Oxygen Saturation % 98 %     5 Minute Liters of Oxygen 0 L     6 Minute Oxygen Saturation % 98 %  6 Minute Liters of Oxygen 0 L     2 Minute Post Oxygen Saturation % 100 %     2 Minute Post Liters of Oxygen 0 L     Pre/Post BP   Baseline BP 118/72 mmHg     6 Minute BP 144/86 mmHg     Pre/Post BP? Yes        Initial Exercise Prescription:     Initial Exercise Prescription - 05/01/15 1600    Date of Initial Exercise Prescription   Date 05/01/15   Oxygen   Oxygen --  Room Air   Bike   Level 0.9   Minutes 15   NuStep   Level 1   Minutes 15   METs 2    Track   Laps 8   Minutes 15   Prescription Details   Frequency (times per week) 2   Duration Progress to 45 minutes of aerobic exercise without signs/symptoms of physical distress   Intensity   THRR 40-80% of Max Heartrate 60-120   Ratings of Perceived Exertion 11-13   Perceived Dyspnea 0-4   Progression   Progression Continue to progress workloads to maintain intensity without signs/symptoms of physical distress.   Resistance Training   Training Prescription Yes   Weight Orange Bands   Reps 10-12      Perform Capillary Blood Glucose checks as needed.  Exercise Prescription Changes:     Exercise Prescription Changes      05/03/15 1200 05/08/15 1600 05/10/15 1300 05/15/15 1100 05/17/15 1200   Exercise Review   Progression No Yes No Yes No   Response to Exercise   Blood Pressure (Admit) 108/72 mmHg 140/60 mmHg 116/70 mmHg 120/64 mmHg 114/72 mmHg   Blood Pressure (Exercise) 160/84 mmHg 154/74 mmHg 136/86 mmHg 120/72 mmHg 132/70 mmHg   Blood Pressure (Exit) 108/60 mmHg 126/84 mmHg 118/60 mmHg 102/60 mmHg 120/74 mmHg   Heart Rate (Admit) 74 bpm 93 bpm 74 bpm 82 bpm 75 bpm   Heart Rate (Exercise) 86 bpm 100 bpm 105 bpm 92 bpm 102 bpm   Heart Rate (Exit) 83 bpm 86 bpm 79 bpm 82 bpm 78 bpm   Oxygen Saturation (Admit) 94 % 96 % 94 % 96 % 98 %   Oxygen Saturation (Exercise) 96 % 92 % 98 % 94 % 96 %   Oxygen Saturation (Exit) 97 % 97 % 92 % 95 % 95 %   Rating of Perceived Exertion (Exercise) 12 12 11 8 12    Perceived Dyspnea (Exercise) 2 1 1 1 1    Symptoms    none    Comments    Reviewed home exercise on 05/15/15    Duration Progress to 45 minutes of aerobic exercise without signs/symptoms of physical distress Progress to 45 minutes of aerobic exercise without signs/symptoms of physical distress Progress to 45 minutes of aerobic exercise without signs/symptoms of physical distress Progress to 45 minutes of aerobic exercise without signs/symptoms of physical distress Progress to 45  minutes of aerobic exercise without signs/symptoms of physical distress   Intensity Other (comment) THRR unchanged THRR unchanged THRR unchanged THRR unchanged   Progression   Progression --  40-80% HRR Continue to progress workloads to maintain intensity without signs/symptoms of physical distress. Continue to progress workloads to maintain intensity without signs/symptoms of physical distress. Continue to progress workloads to maintain intensity without signs/symptoms of physical distress. Continue to progress workloads to maintain intensity without signs/symptoms of physical distress.   Resistance Training   Training Prescription Yes  Yes Yes Yes Yes   Weight Orange Bands Orange Bands orange bands orange bands orange bands   Reps 10-12 10-12 10-12 10-12 10-12   Interval Training   Interval Training No No No No No   NuStep   Level 1 2 2 2 2    Minutes 15 15 15 15 15    METs  1.5 1.7 1.6 2   Arm Ergometer   Level  1  1 1    Minutes  15  15 15    Track   Laps 12 12 15 15     Minutes 15 15 15 15     Home Exercise Plan   Plans to continue exercise at    [employer] (comment)  [facility] and walk at home    Frequency    Add 1 additional day to program exercise sessions. Add 1 additional day to program exercise sessions.     05/22/15 1200           Exercise Review   Progression Yes       Response to Exercise   Blood Pressure (Admit) 124/70 mmHg       Blood Pressure (Exercise) 140/80 mmHg       Blood Pressure (Exit) 130/66 mmHg       Heart Rate (Admit) 81 bpm       Heart Rate (Exercise) 81 bpm       Heart Rate (Exit) 88 bpm       Oxygen Saturation (Admit) 98 %       Oxygen Saturation (Exercise) 97 %       Oxygen Saturation (Exit) 96 %       Rating of Perceived Exertion (Exercise) 12       Perceived Dyspnea (Exercise) 1       Duration Progress to 45 minutes of aerobic exercise without signs/symptoms of physical distress       Intensity THRR unchanged       Progression    Progression Continue to progress workloads to maintain intensity without signs/symptoms of physical distress.       Resistance Training   Training Prescription Yes       Weight orange bands       Reps 10-12       Interval Training   Interval Training No       NuStep   Level 2       Minutes 15       METs 2.2       Arm Ergometer   Level 3       Minutes 15       Track   Laps 5  nutrition consult       Minutes 5  nutrition consult for additional 10 min of exercise time          Exercise Comments:     Exercise Comments      05/21/15 1421           Exercise Comments Doing well with exercise tolerance, will continue to follow exericse progression          Discharge Exercise Prescription (Final Exercise Prescription Changes):     Exercise Prescription Changes - 05/22/15 1200    Exercise Review   Progression Yes   Response to Exercise   Blood Pressure (Admit) 124/70 mmHg   Blood Pressure (Exercise) 140/80 mmHg   Blood Pressure (Exit) 130/66 mmHg   Heart Rate (Admit) 81 bpm   Heart Rate (Exercise) 81 bpm   Heart Rate (Exit)  88 bpm   Oxygen Saturation (Admit) 98 %   Oxygen Saturation (Exercise) 97 %   Oxygen Saturation (Exit) 96 %   Rating of Perceived Exertion (Exercise) 12   Perceived Dyspnea (Exercise) 1   Duration Progress to 45 minutes of aerobic exercise without signs/symptoms of physical distress   Intensity THRR unchanged   Progression   Progression Continue to progress workloads to maintain intensity without signs/symptoms of physical distress.   Resistance Training   Training Prescription Yes   Weight orange bands   Reps 10-12   Interval Training   Interval Training No   NuStep   Level 2   Minutes 15   METs 2.2   Arm Ergometer   Level 3   Minutes 15   Track   Laps 5  nutrition consult   Minutes 5  nutrition consult for additional 10 min of exercise time       Nutrition:  Target Goals: Understanding of nutrition guidelines, daily intake of  sodium <1572m, cholesterol <2025m calories 30% from fat and 7% or less from saturated fats, daily to have 5 or more servings of fruits and vegetables.  Biometrics:     Pre Biometrics - 04/27/15 1610    Pre Biometrics   Grip Strength --       Nutrition Therapy Plan and Nutrition Goals:     Nutrition Therapy & Goals - 05/22/15 1214    Nutrition Therapy   Diet 1200-1500 kcal weight loss   Personal Nutrition Goals   Personal Goal #1 Wt loss of 6-12 lb at graduation from Pulmonary Rehab. Pt goal wt is 160-161 lb.   Intervention Plan   Intervention Prescribe, educate and counsel regarding individualized specific dietary modifications aiming towards targeted core components such as weight, hypertension, lipid management, diabetes, heart failure and other comorbidities.;Nutrition handout(s) given to patient.   Expected Outcomes Short Term Goal: Understand basic principles of dietary content, such as calories, fat, sodium, cholesterol and nutrients.;Long Term Goal: Adherence to prescribed nutrition plan.      Nutrition Discharge: Rate Your Plate Scores:     Nutrition Assessments - 05/22/15 1214    Rate Your Plate Scores   Pre Score 63      Psychosocial: Target Goals: Acknowledge presence or absence of depression, maximize coping skills, provide positive support system. Participant is able to verbalize types and ability to use techniques and skills needed for reducing stress and depression.  Initial Review & Psychosocial Screening:     Initial Psych Review & Screening - 04/27/15 1531    Screening Interventions   Interventions --      Quality of Life Scores:     Quality of Life - 05/10/15 1442    Quality of Life Scores   Health/Function Pre 24.86 %   Socioeconomic Pre 24 %   Psych/Spiritual Pre 25.2 %   Family Pre 23.63 %   GLOBAL Pre 24.55 %      PHQ-9:     Recent Review Flowsheet Data    [facility]   Decreased Interest 0   Down,  Depressed, Hopeless 0   PHQ - 2 Score 0      Psychosocial Evaluation and Intervention:     Psychosocial Evaluation - 04/27/15 1532    Psychosocial Evaluation & Interventions   Interventions Encouraged to exercise with the program and follow exercise prescription   Comments Patient encouraged to exercise for mental and physical health to assist with a better quality of sleep.  Psychosocial Re-Evaluation:     Psychosocial Re-Evaluation      05/22/15 1422           Psychosocial Re-Evaluation   Interventions Stress management education;Relaxation education;Encouraged to attend Pulmonary Rehabilitation for the exercise       Comments patient attended meditation and mindfulness education and encouraged to use at home for stress reduction and relaxation       Continued Psychosocial Services Needed Yes         Education: Education Goals: Education classes will be provided on a weekly basis, covering required topics. Participant will state understanding/return demonstration of topics presented.  Learning Barriers/Preferences:     Learning Barriers/Preferences - 04/27/15 1512    Learning Barriers/Preferences   Learning Barriers None   Learning Preferences Written Material      Education Topics: Risk Factor Reduction:  -Group instruction that is supported by a PowerPoint presentation. Instructor discusses the definition of a risk factor, different risk factors for pulmonary disease, and how the heart and lungs work together.            PULMONARY REHAB OTHER RESPIRATORY from 05/03/2015 in [place]   Date  05/03/15   Educator  EP   Instruction Review Code  2- meets goals/outcomes      Nutrition for Pulmonary Patient:  -Group instruction provided by PowerPoint slides, verbal discussion, and written materials to support subject matter. The instructor gives an explanation and review of healthy diet recommendations, which includes a  discussion on weight management, recommendations for fruit and vegetable consumption, as well as protein, fluid, caffeine, fiber, sodium, sugar, and alcohol. Tips for eating when patients are short of breath are discussed.      PULMONARY REHAB OTHER RESPIRATORY from 05/10/2015 in [place]   Date  05/10/15   Educator  RD   Instruction Review Code  2- meets goals/outcomes      Pursed Lip Breathing:  -Group instruction that is supported by demonstration and informational handouts. Instructor discusses the benefits of pursed lip and diaphragmatic breathing and detailed demonstration on how to preform both.     Oxygen Safety:  -Group instruction provided by PowerPoint, verbal discussion, and written material to support subject matter. There is an overview of "What is Oxygen" and "Why do we need it".  Instructor also reviews how to create a safe environment for oxygen use, the importance of using oxygen as prescribed, and the risks of noncompliance. There is a brief discussion on traveling with oxygen and resources the patient may utilize.   Oxygen Equipment:  -Group instruction provided by [facility] Staff utilizing handouts, written materials, and equipment demonstrations.   Signs and Symptoms:  -Group instruction provided by written material and verbal discussion to support subject matter. Warning signs and symptoms of infection, stroke, and heart attack are reviewed and when to call the physician/911 reinforced. Tips for preventing the spread of infection discussed.   Advanced Directives:  -Group instruction provided by verbal instruction and written material to support subject matter. Instructor reviews Advanced Directive laws and proper instruction for filling out document.   Pulmonary Video:  -Group video education that reviews the importance of medication and oxygen compliance, exercise, good nutrition, pulmonary hygiene, and pursed lip and  diaphragmatic breathing for the pulmonary patient.   Exercise for the Pulmonary Patient:  -Group instruction that is supported by a PowerPoint presentation. Instructor discusses benefits of exercise, core components of exercise, frequency, duration, and intensity of  an exercise routine, importance of utilizing pulse oximetry during exercise, safety while exercising, and options of places to exercise outside of rehab.     Pulmonary Medications:  -Verbally interactive group education provided by instructor with focus on inhaled medications and proper administration.   Anatomy and Physiology of the Respiratory System and Intimacy:  -Group instruction provided by PowerPoint, verbal discussion, and written material to support subject matter. Instructor reviews respiratory cycle and anatomical components of the respiratory system and their functions. Instructor also reviews differences in obstructive and restrictive respiratory diseases with examples of each. Intimacy, Sex, and Sexuality differences are reviewed with a discussion on how relationships can change when diagnosed with pulmonary disease. Common sexual concerns are reviewed.   Knowledge Questionnaire Score:     Knowledge Questionnaire Score - 05/10/15 1442    Knowledge Questionnaire Score   Pre Score 10/13      Core Components/Risk Factors/Patient Goals at Admission:     Personal Goals and Risk Factors at Admission - 05/22/15 1423    Core Components/Risk Factors/Patient Goals on Admission   Increase Strength and Stamina Yes   Intervention Provide advice, education, support and counseling about physical activity/exercise needs.;Develop an individualized exercise prescription for aerobic and resistive training based on initial evaluation findings, risk stratification, comorbidities and participant's personal goals.   Expected Outcomes Achievement of increased cardiorespiratory fitness and enhanced flexibility, muscular endurance and  strength shown through measurements of functional capacity and personal statement of participant.      Core Components/Risk Factors/Patient Goals Review:      Goals and Risk Factor Review      05/22/15 1428           Core Components/Risk Factors/Patient Goals Review   Personal Goals Review Weight Management/Obesity;Sedentary;Increase Strength and Stamina;Improve shortness of breath with ADL's;Other;Stress;Develop more efficient breathing techniques such as purse lipped breathing and diaphragmatic breathing and practicing self-pacing with activity.       Review see "comments" sections on ITP       Expected Outcomes see Admission expected outcomes          Core Components/Risk Factors/Patient Goals at Discharge (Final Review):      Goals and Risk Factor Review - 05/22/15 1428    Core Components/Risk Factors/Patient Goals Review   Personal Goals Review Weight Management/Obesity;Sedentary;Increase Strength and Stamina;Improve shortness of breath with ADL's;Other;Stress;Develop more efficient breathing techniques such as purse lipped breathing and diaphragmatic breathing and practicing self-pacing with activity.   Review see "comments" sections on ITP   Expected Outcomes see Admission expected outcomes      ITP Comments:     ITP Comments      05/08/15 1635           ITP Comments see chart for paper documentation          Comments: Brooke Jimenez's weight remains the same as at admission. She had a nutrition consult with RD to discuss weight loss goals. She is now exercising 1 day at home and continues to exercise 2X/week in pulmonary rehab. She states her stamina and strength are beginning to improve. She described more fatigue at todays exercise session but contributes that to having to have her husband at the airport at 3:30am and could not fall back asleep upon her return home. She is utilizing pursed lip breathing to decrease her SOB with exertion with ADLs. As she learns more  about her disease, her stress level regarding her diagnosis of bronchiectasis is easing. She is still in the process of  developing a bedtime routein to assist with better sleep quality. She is encouraged to continue exercising to improve her physical and mental health.

## 2015-05-22 NOTE — Progress Notes (Signed)
Brooke Jimenez 70 y.o. female Nutrition Note Spoke with pt. Pt is overweight and wants to lose wt. Pt is in the preparation phase of change. Pt states she has previously done the [employer]. Healthy weight loss programs discussed. Wt loss tips reviewed. Pt is making healthy food choices the majority of the time. Pt feels her barrier to wt loss includes portion control and eating out frequently. Pt's Rate Your Plate results reviewed with pt. Pt many salty foods; uses some canned food.  Pt does not add salt to food.  The role of sodium in lung disease reviewed with pt. Pt expressed understanding of the information reviewed via feedback method.    No results found for: HGBA1C  Nutrition Diagnosis ? Food-and nutrition-related knowledge deficit related to lack of exposure to information as related to diagnosis of pulmonary disease ? Overweight related to excessive energy intake as evidenced by a BMI of 28.7  Nutrition Rx/Estimated Daily Nutr Needs for wt loss 1200-1500 Kcal  65-75 gm protein   1500 mg or less sodium Nutrition Intervention ? Pt's individual nutrition plan and goals reviewed with pt. ? Benefits of adopting healthy eating habits discussed when pt's Rate Your Plate reviewed. ? Handout given for 1200 kcal, 5 day menu ideas ? Pt to attend the Nutrition and Lung Disease class - met ? Continual client-centered nutrition education by RD, as part of interdisciplinary care. Goal(s) 1. Identify food quantities necessary to achieve wt loss of  -2# per week to a goal wt of 161-166 lb at graduation from pulmonary rehab. Monitor and Evaluate progress toward nutrition goal with team.   Derek Mound, M.Ed, RD, LDN, CDE 05/22/2015 12:24 PM

## 2015-05-22 NOTE — Progress Notes (Signed)
Daily Session Note  Patient Details  Name: Brooke Jimenez MRN: [id number] Date of Birth: August 20, 1945 Referring Provider:  Karleen Hampshire., MD  Encounter Date: 05/22/2015  Check In:     Session Check In - 05/22/15 1409    Check-In   Location [facility]   Staff Present Rosebud Poles, RN, BSN;Ramon Dredge, RN, MHA;Helyn Schwan Rollene Rotunda, RN, Levie Heritage, MA, ACSM RCEP, Exercise Physiologist   Supervising physician immediately available to respond to emergencies [facility] immediately available   Physician(s) Dr. Marily Memos   Medication changes reported     No   Fall or balance concerns reported    No   Warm-up and Cool-down Performed as group-led instruction   Resistance Training Performed Yes   VAD Patient? No   Pain Assessment   Currently in Pain? No/denies      Capillary Blood Glucose: No results found for this or any previous visit (from the past 24 hour(s)).      Exercise Prescription Changes - 05/22/15 1200    Exercise Review   Progression Yes   Response to Exercise   Blood Pressure (Admit) 124/70 mmHg   Blood Pressure (Exercise) 140/80 mmHg   Blood Pressure (Exit) 130/66 mmHg   Heart Rate (Admit) 81 bpm   Heart Rate (Exercise) 81 bpm   Heart Rate (Exit) 88 bpm   Oxygen Saturation (Admit) 98 %   Oxygen Saturation (Exercise) 97 %   Oxygen Saturation (Exit) 96 %   Rating of Perceived Exertion (Exercise) 12   Perceived Dyspnea (Exercise) 1   Duration Progress to 45 minutes of aerobic exercise without signs/symptoms of physical distress   Intensity THRR unchanged   Progression   Progression Continue to progress workloads to maintain intensity without signs/symptoms of physical distress.   Resistance Training   Training Prescription Yes   Weight orange bands   Reps 10-12   Interval Training   Interval Training No   NuStep   Level 2   Minutes 15   METs 2.2   Arm Ergometer   Level 3   Minutes 15   Track   Laps 5  nutrition consult    Minutes 5  nutrition consult for additional 10 min of exercise time     Goals Met:  Improved SOB with ADL's Using PLB without cueing & demonstrates good technique Changing diet to healthy choices, watching portion sizes Exercise tolerated well Personal goals reviewed No report of cardiac concerns or symptoms  Goals Unmet:  Not Applicable  Comments: Service time is from 1030 to 1200   Dr. Rush Farmer is Medical Director for Pulmonary Rehab at [facility].

## 2015-05-24 ENCOUNTER — Encounter (HOSPITAL_COMMUNITY)
Admission: RE | Admit: 2015-05-24 | Discharge: 2015-05-24 | Disposition: A | Discharge status: Home / Self Care | Payer: Medicare Other | Source: Ambulatory Visit | Attending: Pulmonary Disease | Admitting: Pulmonary Disease

## 2015-05-24 VITALS — Wt 173.1 lb

## 2015-05-24 DIAGNOSIS — J471 Bronchiectasis with (acute) exacerbation: Secondary | ICD-10-CM | POA: Diagnosis not present

## 2015-05-24 NOTE — Progress Notes (Signed)
Daily Session Note  Patient Details  Name: Brooke Jimenez MRN: [id number] Date of Birth: [birth year] Referring Provider:  Karleen Hampshire., MD  Encounter Date: 05/24/2015  Check In:     Session Check In - 05/24/15 1345    Check-In   Location [facility]   Staff Present Rosebud Poles, RN, BSN;Lisa Ysidro Evert, RN;Xanthe Couillard, MS, ACSM RCEP, Exercise Physiologist;Portia Rollene Rotunda, RN, BSN   Supervising physician immediately available to respond to emergencies [facility] immediately available   Physician(s) Dr. Marily Memos   Medication changes reported     No   Fall or balance concerns reported    No   Warm-up and Cool-down Performed as group-led instruction   Resistance Training Performed Yes   VAD Patient? No   Pain Assessment   Currently in Pain? No/denies   Multiple Pain Sites No      Capillary Blood Glucose: No results found for this or any previous visit (from the past 24 hour(s)).      Exercise Prescription Changes - 05/24/15 1300    Exercise Review   Progression No   Response to Exercise   Blood Pressure (Admit) 110/70 mmHg   Blood Pressure (Exercise) 130/74 mmHg   Blood Pressure (Exit) 104/60 mmHg   Heart Rate (Admit) 80 bpm   Heart Rate (Exercise) 93 bpm   Heart Rate (Exit) 93 bpm   Oxygen Saturation (Admit) 97 %   Oxygen Saturation (Exercise) 95 %   Oxygen Saturation (Exit) 96 %   Rating of Perceived Exertion (Exercise) 11   Perceived Dyspnea (Exercise) 2   Duration Progress to 45 minutes of aerobic exercise without signs/symptoms of physical distress   Intensity THRR unchanged   Progression   Progression Continue to progress workloads to maintain intensity without signs/symptoms of physical distress.   Resistance Training   Training Prescription Yes   Weight orange   Reps 10-12   Interval Training   Interval Training No   Oxygen   Oxygen --  room air   Arm Ergometer   Level 2   Minutes 15   Track   Laps 15   Minutes 15     Goals  Met:  Improved SOB with ADL's Using PLB without cueing & demonstrates good technique Exercise tolerated well No report of cardiac concerns or symptoms Strength training completed today  Goals Unmet:  Not Applicable  Comments: Service time is from 10:30am to 12:20pm    Dr. Rush Farmer is Medical Director for Pulmonary Rehab at [facility].

## 2015-05-29 ENCOUNTER — Encounter (HOSPITAL_COMMUNITY)
Admission: RE | Admit: 2015-05-29 | Discharge: 2015-05-29 | Disposition: A | Discharge status: Home / Self Care | Payer: Medicare Other | Source: Ambulatory Visit | Attending: Pulmonary Disease | Admitting: Pulmonary Disease

## 2015-05-29 VITALS — Wt 174.8 lb

## 2015-05-29 DIAGNOSIS — J471 Bronchiectasis with (acute) exacerbation: Secondary | ICD-10-CM | POA: Insufficient documentation

## 2015-05-29 NOTE — Progress Notes (Signed)
Daily Session Note  Patient Details  Name: Brooke Jimenez MRN: [id number] Date of Birth: 1946/01/11 Referring Provider:  Karleen Hampshire., MD  Encounter Date: 05/29/2015  Check In:     Session Check In - 05/29/15 1023    Check-In   Location [facility]   Staff Present Rodney Langton, Felipe Drone, RN, MHA;Portia Rollene Rotunda, RN, Levie Heritage, MA, ACSM RCEP, Exercise Physiologist   Supervising physician immediately available to respond to emergencies [facility] immediately available   Physician(s) Dr. Marily Memos   Medication changes reported     No   Fall or balance concerns reported    No   Warm-up and Cool-down Performed as group-led instruction   Resistance Training Performed Yes   VAD Patient? No   Pain Assessment   Currently in Pain? No/denies   Multiple Pain Sites No      Capillary Blood Glucose: No results found for this or any previous visit (from the past 24 hour(s)).      Exercise Prescription Changes - 05/29/15 1200    Exercise Review   Progression Yes   Response to Exercise   Blood Pressure (Admit) 124/78 mmHg   Blood Pressure (Exercise) 160/86 mmHg   Blood Pressure (Exit) 120/64 mmHg   Heart Rate (Admit) 84 bpm   Heart Rate (Exercise) 92 bpm   Heart Rate (Exit) 88 bpm   Oxygen Saturation (Admit) 97 %   Oxygen Saturation (Exercise) 98 %   Oxygen Saturation (Exit) 96 %   Rating of Perceived Exertion (Exercise) 12   Perceived Dyspnea (Exercise) 1.5   Duration Progress to 45 minutes of aerobic exercise without signs/symptoms of physical distress   Intensity THRR unchanged   Progression   Progression Continue to progress workloads to maintain intensity without signs/symptoms of physical distress.   Resistance Training   Training Prescription Yes   Weight orange bands   Reps 10-12   Interval Training   Interval Training No   NuStep   Level 3   Minutes 15   METs 2.2   Arm Ergometer   Level 3   Minutes 15   Track   Laps 14    Minutes 15     Goals Met:  Exercise tolerated well No report of cardiac concerns or symptoms Strength training completed today  Goals Unmet:  Not Applicable  Comments: Service time is from 1030 to 1210    Dr. Rush Farmer is Medical Director for Pulmonary Rehab at [facility]

## 2015-05-31 ENCOUNTER — Encounter (HOSPITAL_COMMUNITY)
Admission: RE | Admit: 2015-05-31 | Discharge: 2015-05-31 | Disposition: A | Discharge status: Home / Self Care | Payer: Medicare Other | Source: Ambulatory Visit | Attending: Pulmonary Disease | Admitting: Pulmonary Disease

## 2015-05-31 VITALS — Wt 174.4 lb

## 2015-05-31 DIAGNOSIS — J471 Bronchiectasis with (acute) exacerbation: Secondary | ICD-10-CM

## 2015-05-31 NOTE — Progress Notes (Signed)
Daily Session Note  Patient Details  Name: Brooke Jimenez MRN: [id number] Date of Birth: 03/12/1945 Referring Provider:  Karleen Hampshire., MD  Encounter Date: 05/31/2015  Check In:     Session Check In - 05/31/15 1212    Check-In   [facility]   Staff Present Rosebud Poles, RN, BSN;Molly diVincenzo, MS, ACSM RCEP, Exercise Physiologist;Basir Niven Ysidro Evert, RN;Portia Rollene Rotunda, RN, BSN   Supervising physician immediately available to respond to emergencies [facility] immediately available   Physician(s) Dr. Loleta Books   Medication changes reported     No   Fall or balance concerns reported    No   Warm-up and Cool-down Performed as group-led instruction   Resistance Training Performed Yes   VAD Patient? No   Pain Assessment   Currently in Pain? No/denies   Multiple Pain Sites No      Capillary Blood Glucose: No results found for this or any previous visit (from the past 24 hour(s)).      Exercise Prescription Changes - 05/31/15 1300    Exercise Review   Progression Yes   Response to Exercise   Blood Pressure (Admit) 120/74 mmHg   Blood Pressure (Exercise) 132/76 mmHg   Blood Pressure (Exit) 120/80 mmHg   Heart Rate (Admit) 81 bpm   Heart Rate (Exercise) 96 bpm   Heart Rate (Exit) 81 bpm   Oxygen Saturation (Admit) 96 %   Oxygen Saturation (Exercise) 94 %   Oxygen Saturation (Exit) 94 %   Rating of Perceived Exertion (Exercise) 12   Perceived Dyspnea (Exercise) 2   Duration Progress to 45 minutes of aerobic exercise without signs/symptoms of physical distress   Intensity THRR unchanged   Progression   Progression Continue to progress workloads to maintain intensity without signs/symptoms of physical distress.   Resistance Training   Training Prescription Yes   Weight orange bands   Reps 10-12   Interval Training   Interval Training No   Arm Ergometer   Level 3   Minutes 15   Track   Laps 16   Minutes 15     Goals Met:  Exercise tolerated  well No report of cardiac concerns or symptoms Strength training completed today  Goals Unmet:  Not Applicable  Comments: **Service time is from 1030 to 1230    Dr. Rush Farmer is Medical Director for Pulmonary Rehab at [facility].

## 2015-06-05 ENCOUNTER — Encounter (HOSPITAL_COMMUNITY)
Admission: RE | Admit: 2015-06-05 | Discharge: 2015-06-05 | Disposition: A | Discharge status: Home / Self Care | Payer: Medicare Other | Source: Ambulatory Visit | Attending: Pulmonary Disease | Admitting: Pulmonary Disease

## 2015-06-05 VITALS — Wt 173.7 lb

## 2015-06-05 DIAGNOSIS — J471 Bronchiectasis with (acute) exacerbation: Secondary | ICD-10-CM | POA: Diagnosis not present

## 2015-06-05 NOTE — Progress Notes (Signed)
Daily Session Note  Patient Details  Name: Brooke Jimenez MRN: [id number] Date of Birth: 05-Feb-1946 Referring Provider:  Rush Farmer, MD  Encounter Date: 06/05/2015  Check In:     Session Check In - 06/05/15 1106    Check-In   [facility]   Staff Present Rodney Langton, RN;Portia Rollene Rotunda, RN, Deland Pretty, MS, ACSM CEP, Exercise Physiologist;Molly diVincenzo, MS, ACSM RCEP, Exercise Physiologist   Supervising physician immediately available to respond to emergencies [facility] immediately available   Physician(s) Dr. Marily Memos   Medication changes reported     No   Fall or balance concerns reported    No   Warm-up and Cool-down Performed as group-led instruction   Resistance Training Performed Yes   VAD Patient? No   Pain Assessment   Currently in Pain? No/denies   Multiple Pain Sites No      Capillary Blood Glucose: No results found for this or any previous visit (from the past 24 hour(s)).      Exercise Prescription Changes - 06/05/15 1200    Exercise Review   Progression Yes   Response to Exercise   Blood Pressure (Admit) 132/78 mmHg   Blood Pressure (Exercise) 130/82 mmHg   Blood Pressure (Exit) 115/76 mmHg   Heart Rate (Admit) 77 bpm   Heart Rate (Exercise) 106 bpm   Heart Rate (Exit) 78 bpm   Oxygen Saturation (Admit) 95 %   Oxygen Saturation (Exercise) 96 %   Oxygen Saturation (Exit) 94 %   Rating of Perceived Exertion (Exercise) 12   Perceived Dyspnea (Exercise) 1   Duration Progress to 45 minutes of aerobic exercise without signs/symptoms of physical distress   Intensity THRR unchanged   Progression   Progression Continue to progress workloads to maintain intensity without signs/symptoms of physical distress.   Resistance Training   Training Prescription Yes   Weight orange bands   Reps 10-12   Interval Training   Interval Training No   NuStep   Level 4   Minutes 15   METs 2.2   Arm Ergometer   Level 3   Minutes 15   Track   Laps 17   Minutes 15     Goals Met:  Exercise tolerated well No report of cardiac concerns or symptoms Strength training completed today  Goals Unmet:  Not Applicable  Comments: Service time is from 1030 to 1205     Dr. Rush Farmer is Medical Director for Pulmonary Rehab at [facility].

## 2015-06-07 ENCOUNTER — Encounter (HOSPITAL_COMMUNITY)
Admission: RE | Admit: 2015-06-07 | Discharge: 2015-06-07 | Disposition: A | Discharge status: Home / Self Care | Payer: Medicare Other | Source: Ambulatory Visit | Attending: Pulmonary Disease | Admitting: Pulmonary Disease

## 2015-06-07 VITALS — Wt 173.9 lb

## 2015-06-07 DIAGNOSIS — J471 Bronchiectasis with (acute) exacerbation: Secondary | ICD-10-CM

## 2015-06-07 NOTE — Progress Notes (Signed)
Daily Session Note  Patient Details  Name: Brooke Jimenez MRN: [id number] Date of Birth: 1945/04/19 Referring Provider:    Encounter Date: 06/07/2015  Check In:     Session Check In - 06/07/15 1244    Check-In   Location [facility]   Staff Present Su Hilt, MS, ACSM RCEP, Exercise Physiologist;Portia Rollene Rotunda, Therapist, sports, BSN;Ramon Dredge, RN, MHA;Maria Whitaker, RN, BSN   Supervising physician immediately available to respond to emergencies [facility] immediately available   Physician(s) Dr. Aggie Moats   Medication changes reported     No   Fall or balance concerns reported    No   Warm-up and Cool-down Performed as group-led instruction   Resistance Training Performed Yes   VAD Patient? No   Pain Assessment   Currently in Pain? No/denies   Multiple Pain Sites No      Capillary Blood Glucose: No results found for this or any previous visit (from the past 24 hour(s)).      Exercise Prescription Changes - 06/07/15 1200    Exercise Review   Progression No   Response to Exercise   Blood Pressure (Admit) 124/64 mmHg   Blood Pressure (Exercise) 128/70 mmHg   Blood Pressure (Exit) 110/70 mmHg   Heart Rate (Admit) 83 bpm   Heart Rate (Exercise) 97 bpm   Heart Rate (Exit) 87 bpm   Oxygen Saturation (Admit) 99 %   Oxygen Saturation (Exercise) 96 %   Oxygen Saturation (Exit) 94 %   Rating of Perceived Exertion (Exercise) 12   Perceived Dyspnea (Exercise) 1   Duration Progress to 45 minutes of aerobic exercise without signs/symptoms of physical distress   Intensity THRR unchanged   Progression   Progression Continue to progress workloads to maintain intensity without signs/symptoms of physical distress.   Resistance Training   Training Prescription Yes   Weight blue bands   Reps 10-12   Interval Training   Interval Training No   NuStep   Level 4   Minutes 15   METs 2.5   Track   Laps 15   Minutes 15     Goals Met:  Using PLB without  cueing & demonstrates good technique Exercise tolerated well No report of cardiac concerns or symptoms Strength training completed today  Goals Unmet:  Not Applicable  Comments: Service time is from 1030 to 1215    Dr. Rush Farmer is Medical Director for Pulmonary Rehab at [facility].

## 2015-06-12 ENCOUNTER — Encounter (HOSPITAL_COMMUNITY)
Admission: RE | Admit: 2015-06-12 | Discharge: 2015-06-12 | Disposition: A | Discharge status: Home / Self Care | Payer: Medicare Other | Source: Ambulatory Visit | Attending: Pulmonary Disease | Admitting: Pulmonary Disease

## 2015-06-12 DIAGNOSIS — J471 Bronchiectasis with (acute) exacerbation: Secondary | ICD-10-CM | POA: Diagnosis not present

## 2015-06-12 NOTE — Progress Notes (Signed)
Daily Session Note  Patient Details  Name: Brooke Jimenez MRN: [id number] Date of Birth: 18-Jan-1946 Referring Provider:    Encounter Date: 06/12/2015  Check In:     Session Check In - 06/12/15 1220    Check-In   Location [facility]   Staff Present Rosebud Poles, RN, Luisa Hart, RN, BSN;Ramon Dredge, RN, MHA;Molly diVincenzo, MS, ACSM RCEP, Exercise Physiologist   Supervising physician immediately available to respond to emergencies [facility] immediately available   Physician(s) Dr. Marily Memos   Medication changes reported     No   Fall or balance concerns reported    No   Warm-up and Cool-down Performed as group-led instruction   Resistance Training Performed Yes   VAD Patient? No   Pain Assessment   Currently in Pain? No/denies   Multiple Pain Sites No      Capillary Blood Glucose: No results found for this or any previous visit (from the past 24 hour(s)).      Exercise Prescription Changes - 06/12/15 1200    Response to Exercise   Blood Pressure (Admit) 120/66 mmHg   Blood Pressure (Exercise) 120/68 mmHg   Blood Pressure (Exit) 110/76 mmHg   Heart Rate (Admit) 87 bpm   Heart Rate (Exercise) 105 bpm   Heart Rate (Exit) 84 bpm   Oxygen Saturation (Admit) 96 %   Oxygen Saturation (Exercise) 98 %   Oxygen Saturation (Exit) 98 %   Rating of Perceived Exertion (Exercise) 13   Perceived Dyspnea (Exercise) 1   Duration Progress to 45 minutes of aerobic exercise without signs/symptoms of physical distress   Intensity THRR unchanged   Progression   Progression Continue to progress workloads to maintain intensity without signs/symptoms of physical distress.   Resistance Training   Training Prescription Yes   Weight blue bands   Reps 10-12   Interval Training   Interval Training No   NuStep   Level 4   Minutes 15   METs 2.2   Arm Ergometer   Level 3   Minutes 15   Track   Laps 12   Minutes 15     Goals Met:  Independence  with exercise equipment Improved SOB with ADL's Exercise tolerated well Strength training completed today  Goals Unmet:  Not Applicable  Comments: Service time is from 1030 to 1210   Dr. Rush Farmer is Medical Director for Pulmonary Rehab at [facility].

## 2015-06-14 ENCOUNTER — Encounter (HOSPITAL_COMMUNITY)
Admission: RE | Admit: 2015-06-14 | Discharge: 2015-06-14 | Disposition: A | Discharge status: Home / Self Care | Payer: Medicare Other | Source: Ambulatory Visit | Attending: Pulmonary Disease | Admitting: Pulmonary Disease

## 2015-06-14 VITALS — Wt 175.5 lb

## 2015-06-14 DIAGNOSIS — J471 Bronchiectasis with (acute) exacerbation: Secondary | ICD-10-CM

## 2015-06-14 NOTE — Progress Notes (Signed)
Daily Session Note  Patient Details  Name: Brooke Jimenez MRN: [id number] Date of Birth: 1945/09/08 Referring Provider:    Encounter Date: 06/14/2015  Check In:     Session Check In - 06/14/15 1106    Check-In   Location [facility]   Staff Present Rosebud Poles, RN, BSN;Molly diVincenzo, MS, ACSM RCEP, Exercise Physiologist;Veron Senner Ysidro Evert, RN;Portia Rollene Rotunda, RN, BSN   Supervising physician immediately available to respond to emergencies [facility] immediately available   Physician(s) Dr. Marily Memos   Medication changes reported     No   Fall or balance concerns reported    No   Warm-up and Cool-down Performed as group-led instruction   Resistance Training Performed Yes   VAD Patient? No   Pain Assessment   Currently in Pain? No/denies   Multiple Pain Sites No      Capillary Blood Glucose: No results found for this or any previous visit (from the past 24 hour(s)).      Exercise Prescription Changes - 06/14/15 1200    Exercise Review   Progression Yes   Response to Exercise   Blood Pressure (Admit) 104/60 mmHg   Blood Pressure (Exercise) 150/80 mmHg   Blood Pressure (Exit) 126/79 mmHg   Heart Rate (Admit) 80 bpm   Heart Rate (Exercise) 99 bpm   Heart Rate (Exit) 81 bpm   Oxygen Saturation (Admit) 96 %   Oxygen Saturation (Exercise) 96 %   Oxygen Saturation (Exit) 92 %   Rating of Perceived Exertion (Exercise) 12   Perceived Dyspnea (Exercise) 0   Duration Progress to 45 minutes of aerobic exercise without signs/symptoms of physical distress   Intensity THRR unchanged   Progression   Progression Continue to progress workloads to maintain intensity without signs/symptoms of physical distress.   Resistance Training   Training Prescription Yes   Weight blue bands   Reps 10-12   Interval Training   Interval Training No   NuStep   Level 5   Minutes 15   Arm Ergometer   Level 4   Minutes 15     Goals Met:  Exercise tolerated well No report of  cardiac concerns or symptoms Strength training completed today  Goals Unmet:  Not Applicable  Comments: Service time is from 1030 to 1230    Dr. Rush Farmer is Medical Director for Pulmonary Rehab at [facility]

## 2015-06-14 NOTE — Progress Notes (Signed)
Pulmonary Individual Treatment Plan  Patient Details  Name: Brooke Jimenez MRN: [id number] Date of Birth: 06/25/1945 Referring Provider:    Initial Encounter Date:       Pulmonary Rehab Walk Test from 05/01/2015 in [place]   Date  05/01/15      Visit Diagnosis: No diagnosis found.  Patient's Home Medications on Admission:   Current outpatient prescriptions:  .  acetaminophen (TYLENOL) 500 MG tablet, Take 500 mg by mouth every 6 (six) hours as needed for mild pain., Disp: , Rfl:  .  B Complex Vitamins (VITAMIN-B COMPLEX PO), Take 1 tablet by mouth daily., Disp: , Rfl:  .  calcium carbonate (TUMS - DOSED IN MG ELEMENTAL CALCIUM) 500 MG chewable tablet, Chew 3 tablets by mouth daily., Disp: , Rfl:  .  clindamycin (CLEOCIN) 150 MG capsule, Take 600 mg by mouth. One hour before dental appointments, Disp: , Rfl:  .  ibuprofen (ADVIL,MOTRIN) 200 MG tablet, Take 200 mg by mouth 2 (two) times daily as needed for headache or moderate pain., Disp: , Rfl:  .  levothyroxine (SYNTHROID, LEVOTHROID) 50 MCG tablet, Take 50 mcg by mouth daily before breakfast., Disp: , Rfl:  .  Lutein 20 MG TABS, Take 1 tablet by mouth daily., Disp: , Rfl:  .  Magnesium 250 MG TABS, Take 250 mg by mouth daily. , Disp: , Rfl:  .  minocycline (MINOCIN,DYNACIN) 50 MG capsule, Take 50 mg by mouth 2 (two) times daily., Disp: , Rfl:  .  Multiple Vitamin (MULTIVITAMIN WITH MINERALS) TABS tablet, Take 1 tablet by mouth daily., Disp: , Rfl:   Past Medical History: Past Medical History  Diagnosis Date  . Mitral valve prolapse   . Hypothyroidism   . Anxiety   . H/O hiatal hernia   . History of melanoma     melanoma- 1986 and 1997   . PTSD (post-traumatic stress disorder) 2006  . Corneal dystrophy   . Dysphagia     chronic- please see ultrasound done 4/16 15 in [facility]   . Hearing loss     bilateral due to nerve loss. wears hearing aides  . Family history of adverse reaction to anesthesia      mother slow to wake up   . Dysrhythmia     sometimes patient has extra beats per patient   . Heart murmur     no problem   . Nocturia   . Arthritis     arthritis in hip   . Peripheral neuropathy (HCC)     feet  . GERD (gastroesophageal reflux disease)     under control  . Esophageal dysmotility   . Low back pain with sciatica   . DDD (degenerative disc disease), cervical     and lower back  . Glaucoma     "suspect"  . Cataract     beginning  . Varicose veins   . Spider veins   . Diverticulosis   . Osteoporosis   . Urinary incontinence     occasional  . Gout     one finger x1 attack  . Measles     hx of  . History of chicken pox   . Acne rosacea   . Complication of anesthesia     "really sore throat"  . PONV (postoperative nausea and vomiting)   . RSD (reflex sympathetic dystrophy) 1993    Tobacco Use: History  Smoking status  . Never Smoker   Smokeless tobacco  . Never  Used    Labs: Recent Review Flowsheet Data    There is no flowsheet data to display.      Capillary Blood Glucose: No results found for: GLUCAP   ADL UCSD:     Pulmonary Assessment Scores      05/10/15 1443       ADL UCSD   SOB Score total 9        Pulmonary Function Assessment:     Pulmonary Function Assessment - 04/27/15 1512    Breath   Bilateral Breath Sounds Clear   Shortness of Breath Yes;Limiting activity      Exercise Target Goals:    Exercise Program Goal: Individual exercise prescription set with THRR, safety & activity barriers. Participant demonstrates ability to understand and report RPE using BORG scale, to self-measure pulse accurately, and to acknowledge the importance of the exercise prescription.  Exercise Prescription Goal: Starting with aerobic activity 30 plus minutes a day, 3 days per week for initial exercise prescription. Provide home exercise prescription and guidelines that participant acknowledges understanding prior to  discharge.  Activity Barriers & Risk Stratification:     Activity Barriers & Cardiac Risk Stratification - 04/27/15 1511    Activity Barriers & Cardiac Risk Stratification   Activity Barriers Deconditioning;Left Hip Replacement;Shortness of Breath      6 Minute Walk:     6 Minute Walk      05/01/15 1628       6 Minute Walk   Phase Initial     Distance 1408 feet     Walk Time 6 minutes     # of Rest Breaks 0     MPH 2.7     METS 3.12     RPE 13     Perceived Dyspnea  2     VO2 Peak 11.1     Symptoms No     Resting HR 75 bpm     Resting BP 118/72 mmHg     Max Ex. HR 111 bpm     Max Ex. BP 144/86 mmHg     2 Minute Post BP 114/80 mmHg     Interval HR   Baseline HR 75     1 Minute HR 106     2 Minute HR 109     3 Minute HR 107     4 Minute HR 108     5 Minute HR 111     6 Minute HR 110     2 Minute Post HR 85     Interval Heart Rate? Yes     Interval Oxygen   Interval Oxygen? Yes     Baseline Oxygen Saturation % 98 %     Baseline Liters of Oxygen 0 L  Room Air     1 Minute Oxygen Saturation % 98 %     1 Minute Liters of Oxygen 0 L     2 Minute Oxygen Saturation % 98 %     2 Minute Liters of Oxygen 0 L     3 Minute Oxygen Saturation % 98 %     3 Minute Liters of Oxygen 0 L     4 Minute Oxygen Saturation % 98 %     4 Minute Liters of Oxygen 0 L     5 Minute Oxygen Saturation % 98 %     5 Minute Liters of Oxygen 0 L     6 Minute Oxygen Saturation % 98 %     6 Minute  Liters of Oxygen 0 L     2 Minute Post Oxygen Saturation % 100 %     2 Minute Post Liters of Oxygen 0 L     Pre/Post BP   Baseline BP 118/72 mmHg     6 Minute BP 144/86 mmHg     Pre/Post BP? Yes        Initial Exercise Prescription:     Initial Exercise Prescription - 05/01/15 1600    Date of Initial Exercise RX and Referring Provider   Date 05/01/15   Oxygen   Oxygen --  Room Air   Bike   Level 0.9   Minutes 15   NuStep   Level 1   Minutes 15   METs 2   Track   Laps 8    Minutes 15   Prescription Details   Frequency (times per week) 2   Duration Progress to 45 minutes of aerobic exercise without signs/symptoms of physical distress   Intensity   THRR 40-80% of Max Heartrate 60-120   Ratings of Perceived Exertion 11-13   Perceived Dyspnea 0-4   Progression   Progression Continue to progress workloads to maintain intensity without signs/symptoms of physical distress.   Resistance Training   Training Prescription Yes   Weight Orange Bands   Reps 10-12      Perform Capillary Blood Glucose checks as needed.  Exercise Prescription Changes:     Exercise Prescription Changes      05/03/15 1200 05/08/15 1600 05/10/15 1300 05/15/15 1100 05/17/15 1200   Exercise Review   Progression No Yes No Yes No   Response to Exercise   Blood Pressure (Admit) 108/72 mmHg 140/60 mmHg 116/70 mmHg 120/64 mmHg 114/72 mmHg   Blood Pressure (Exercise) 160/84 mmHg 154/74 mmHg 136/86 mmHg 120/72 mmHg 132/70 mmHg   Blood Pressure (Exit) 108/60 mmHg 126/84 mmHg 118/60 mmHg 102/60 mmHg 120/74 mmHg   Heart Rate (Admit) 74 bpm 93 bpm 74 bpm 82 bpm 75 bpm   Heart Rate (Exercise) 86 bpm 100 bpm 105 bpm 92 bpm 102 bpm   Heart Rate (Exit) 83 bpm 86 bpm 79 bpm 82 bpm 78 bpm   Oxygen Saturation (Admit) 94 % 96 % 94 % 96 % 98 %   Oxygen Saturation (Exercise) 96 % 92 % 98 % 94 % 96 %   Oxygen Saturation (Exit) 97 % 97 % 92 % 95 % 95 %   Rating of Perceived Exertion (Exercise) _0 Perceived Dyspnea (Exercise) _1 Symptoms    none    Comments    Reviewed home exercise on 05/15/15    Duration Progress to 45 minutes of aerobic exercise without signs/symptoms of physical distress Progress to 45 minutes of aerobic exercise without signs/symptoms of physical distress Progress to 45 minutes of aerobic exercise without signs/symptoms of physical distress Progress to 45 minutes of aerobic exercise without signs/symptoms of physical distress Progress to 45 minutes of aerobic  exercise without signs/symptoms of physical distress   Intensity Other (comment) THRR unchanged THRR unchanged THRR unchanged THRR unchanged   Progression   Progression --  40-80% HRR Continue to progress workloads to maintain intensity without signs/symptoms of physical distress. Continue to progress workloads to maintain intensity without signs/symptoms of physical distress. Continue to progress workloads to maintain intensity without signs/symptoms of physical distress. Continue to progress workloads to maintain intensity without signs/symptoms of physical distress.   Horticulturist, commercial Prescription  _0    Weight _1    Reps 10-12 10-12 10-12 10-12 10-12   Interval Training   Interval Training _2    NuStep   Level _3 Minutes _4 METs  1.5 1.7 1.6 2   Arm Ergometer   Level  _5 Minutes  _6 Track   Laps _7 Minutes _8 Home Exercise Plan   Plans to continue exercise at    [employer] (comment)  [facility] and walk at home    Frequency    Add 1 additional day to program exercise sessions. Add 1 additional day to program exercise sessions.     05/22/15 1200 05/24/15 1300 05/29/15 1200 05/31/15 1300 06/05/15 1200   Exercise Review   Progression Yes No Yes Yes Yes   Response to Exercise   Blood Pressure (Admit) 124/70 mmHg 110/70 mmHg 124/78 mmHg 120/74 mmHg 132/78 mmHg   Blood Pressure (Exercise) 140/80 mmHg 130/74 mmHg 160/86 mmHg 132/76 mmHg 130/82 mmHg   Blood Pressure (Exit) 130/66 mmHg 104/60 mmHg 120/64 mmHg 120/80 mmHg 115/76 mmHg   Heart Rate (Admit) 81 bpm 80 bpm 84 bpm 81 bpm 77 bpm   Heart Rate (Exercise) 81 bpm 93 bpm 92 bpm 96 bpm 106 bpm   Heart Rate (Exit) 88 bpm 93 bpm 88 bpm 81 bpm 78 bpm   Oxygen Saturation (Admit) 98 % 97 % 97 % 96 % 95 %   Oxygen Saturation (Exercise) 97 % 95 % 98 % 94 % 96 %    Oxygen Saturation (Exit) 96 % 96 % 96 % 94 % 94 %   Rating of Perceived Exertion (Exercise) _9 Perceived Dyspnea (Exercise) 1 2 1._10 Duration Progress to 45 minutes of aerobic exercise without signs/symptoms of physical distress Progress to 45 minutes of aerobic exercise without signs/symptoms of physical distress Progress to 45 minutes of aerobic exercise without signs/symptoms of physical distress Progress to 45 minutes of aerobic exercise without signs/symptoms of physical distress Progress to 45 minutes of aerobic exercise without signs/symptoms of physical distress   Intensity _11    Progression   Progression Continue to progress workloads to maintain intensity without signs/symptoms of physical distress. Continue to progress workloads to maintain intensity without signs/symptoms of physical distress. Continue to progress workloads to maintain intensity without signs/symptoms of physical distress. Continue to progress workloads to maintain intensity without signs/symptoms of physical distress. Continue to progress workloads to maintain intensity without signs/symptoms of physical distress.   Resistance Training   Training Prescription _12    Weight orange bands orange orange bands orange bands orange bands   Reps 10-12 10-12 10-12 10-12 10-12   Interval Training   Interval Training _13    Oxygen   Oxygen  --  room air      NuStep   Level _14 Minutes _15 METs 2.2  2.2  2.2   Arm Ergometer   Level _16 Minutes _17 Track   Laps 5  nutrition  consult '15 14 16 17   '$ Minutes 5  nutrition consult for additional 10 min of exercise time '15 15 15 15     '$ 06/07/15 1200 06/12/15 1200         Exercise Review   Progression No       Response to Exercise   Blood Pressure (Admit) 124/64 mmHg 120/66 mmHg      Blood Pressure (Exercise) 128/70 mmHg  120/68 mmHg      Blood Pressure (Exit) 110/70 mmHg 110/76 mmHg      Heart Rate (Admit) 83 bpm 87 bpm      Heart Rate (Exercise) 97 bpm 105 bpm      Heart Rate (Exit) 87 bpm 84 bpm      Oxygen Saturation (Admit) 99 % 96 %      Oxygen Saturation (Exercise) 96 % 98 %      Oxygen Saturation (Exit) 94 % 98 %      Rating of Perceived Exertion (Exercise) 12 13      Perceived Dyspnea (Exercise) 1 1      Duration Progress to 45 minutes of aerobic exercise without signs/symptoms of physical distress Progress to 45 minutes of aerobic exercise without signs/symptoms of physical distress      Intensity THRR unchanged THRR unchanged      Progression   Progression Continue to progress workloads to maintain intensity without signs/symptoms of physical distress. Continue to progress workloads to maintain intensity without signs/symptoms of physical distress.      Resistance Training   Training Prescription Yes Yes      Weight blue bands blue bands      Reps 10-12 10-12      Interval Training   Interval Training No No      NuStep   Level 4 4      Minutes 15 15      METs 2.5 2.2      Arm Ergometer   Level  3      Minutes  15      Track   Laps 15 12      Minutes 15 15         Exercise Comments:     Exercise Comments      05/21/15 1421 06/14/15 0828         Exercise Comments Doing well with exercise tolerance, will continue to follow exericse progression Doing well with exercise tolerance, will continue to follow exercise progression.         Discharge Exercise Prescription (Final Exercise Prescription Changes):     Exercise Prescription Changes - 06/12/15 1200    Response to Exercise   Blood Pressure (Admit) 120/66 mmHg   Blood Pressure (Exercise) 120/68 mmHg   Blood Pressure (Exit) 110/76 mmHg   Heart Rate (Admit) 87 bpm   Heart Rate (Exercise) 105 bpm   Heart Rate (Exit) 84 bpm   Oxygen Saturation (Admit) 96 %   Oxygen Saturation (Exercise) 98 %   Oxygen Saturation (Exit) 98 %    Rating of Perceived Exertion (Exercise) 13   Perceived Dyspnea (Exercise) 1   Duration Progress to 45 minutes of aerobic exercise without signs/symptoms of physical distress   Intensity THRR unchanged   Progression   Progression Continue to progress workloads to maintain intensity without signs/symptoms of physical distress.   Resistance Training   Training Prescription Yes   Weight blue bands   Reps 10-12   Interval Training   Interval Training No   NuStep  Level 4   Minutes 15   METs 2.2   Arm Ergometer   Level 3   Minutes 15   Track   Laps 12   Minutes 15       Nutrition:  Target Goals: Understanding of nutrition guidelines, daily intake of sodium '1500mg'$ , cholesterol '200mg'$ , calories 30% from fat and 7% or less from saturated fats, daily to have 5 or more servings of fruits and vegetables.  Biometrics:     Pre Biometrics - 04/27/15 1610    Pre Biometrics   Grip Strength --       Nutrition Therapy Plan and Nutrition Goals:     Nutrition Therapy & Goals - 05/22/15 1525    Personal Nutrition Goals   Personal Goal #1 Wt loss of 6-12 lb at graduation from Pulmonary Rehab. Pt goal wt is 161-166 lb.      Nutrition Discharge: Rate Your Plate Scores:     Nutrition Assessments - 05/22/15 1214    Rate Your Plate Scores   Pre Score 63      Psychosocial: Target Goals: Acknowledge presence or absence of depression, maximize coping skills, provide positive support system. Participant is able to verbalize types and ability to use techniques and skills needed for reducing stress and depression.  Initial Review & Psychosocial Screening:     Initial Psych Review & Screening - 04/27/15 1531    Screening Interventions   Interventions --      Quality of Life Scores:     Quality of Life - 05/10/15 1442    Quality of Life Scores   Health/Function Pre 24.86 %   Socioeconomic Pre 24 %   Psych/Spiritual Pre 25.2 %   Family Pre 23.63 %   GLOBAL Pre 24.55 %       PHQ-9:     Recent Review Flowsheet Data    Depression screen [facility] 2/9 04/27/2015   Decreased Interest 0   Down, Depressed, Hopeless 0   PHQ - 2 Score 0      Psychosocial Evaluation and Intervention:     Psychosocial Evaluation - 04/27/15 1532    Psychosocial Evaluation & Interventions   Interventions Encouraged to exercise with the program and follow exercise prescription   Comments Patient encouraged to exercise for mental and physical health to assist with a better quality of sleep.      Psychosocial Re-Evaluation:     Psychosocial Re-Evaluation      05/22/15 1422 06/14/15 0758         Psychosocial Re-Evaluation   Interventions Stress management education;Relaxation education;Encouraged to attend Pulmonary Rehabilitation for the exercise Encouraged to attend Pulmonary Rehabilitation for the exercise      Comments patient attended meditation and mindfulness education and encouraged to use at home for stress reduction and relaxation       Continued Psychosocial Services Needed Yes         Education: Education Goals: Education classes will be provided on a weekly basis, covering required topics. Participant will state understanding/return demonstration of topics presented.  Learning Barriers/Preferences:     Learning Barriers/Preferences - 04/27/15 1512    Learning Barriers/Preferences   Learning Barriers None   Learning Preferences Written Material      Education Topics: Risk Factor Reduction:  -Group instruction that is supported by a PowerPoint presentation. Instructor discusses the definition of a risk factor, different risk factors for pulmonary disease, and how the heart and lungs work together.  PULMONARY REHAB OTHER RESPIRATORY from 06/07/2015 in [place]   Date  05/03/15   Educator  EP   Instruction Review Code  2- meets goals/outcomes      Nutrition for Pulmonary Patient:  -Group instruction provided  by PowerPoint slides, verbal discussion, and written materials to support subject matter. The instructor gives an explanation and review of healthy diet recommendations, which includes a discussion on weight management, recommendations for fruit and vegetable consumption, as well as protein, fluid, caffeine, fiber, sodium, sugar, and alcohol. Tips for eating when patients are short of breath are discussed.      PULMONARY REHAB OTHER RESPIRATORY from 06/07/2015 in [place]   Date  05/10/15   Educator  RD   Instruction Review Code  2- meets goals/outcomes      Pursed Lip Breathing:  -Group instruction that is supported by demonstration and informational handouts. Instructor discusses the benefits of pursed lip and diaphragmatic breathing and detailed demonstration on how to preform both.     Oxygen Safety:  -Group instruction provided by PowerPoint, verbal discussion, and written material to support subject matter. There is an overview of "What is Oxygen" and "Why do we need it".  Instructor also reviews how to create a safe environment for oxygen use, the importance of using oxygen as prescribed, and the risks of noncompliance. There is a brief discussion on traveling with oxygen and resources the patient may utilize.      PULMONARY REHAB OTHER RESPIRATORY from 06/07/2015 in [place]   Date  05/31/15   Educator  RN   Instruction Review Code  2- meets goals/outcomes      Oxygen Equipment:  -Group instruction provided by [facility] Staff utilizing handouts, written materials, and equipment demonstrations.   Signs and Symptoms:  -Group instruction provided by written material and verbal discussion to support subject matter. Warning signs and symptoms of infection, stroke, and heart attack are reviewed and when to call the physician/911 reinforced. Tips for preventing the spread of infection discussed.   Advanced Directives:   -Group instruction provided by verbal instruction and written material to support subject matter. Instructor reviews Advanced Directive laws and proper instruction for filling out document.   Pulmonary Video:  -Group video education that reviews the importance of medication and oxygen compliance, exercise, good nutrition, pulmonary hygiene, and pursed lip and diaphragmatic breathing for the pulmonary patient.   Exercise for the Pulmonary Patient:  -Group instruction that is supported by a PowerPoint presentation. Instructor discusses benefits of exercise, core components of exercise, frequency, duration, and intensity of an exercise routine, importance of utilizing pulse oximetry during exercise, safety while exercising, and options of places to exercise outside of rehab.     Pulmonary Medications:  -Verbally interactive group education provided by instructor with focus on inhaled medications and proper administration.      PULMONARY REHAB OTHER RESPIRATORY from 06/07/2015 in [place]   Date  05/24/15   Educator  Pharm D   Instruction Review Code  2- meets goals/outcomes      Anatomy and Physiology of the Respiratory System and Intimacy:  -Group instruction provided by PowerPoint, verbal discussion, and written material to support subject matter. Instructor reviews respiratory cycle and anatomical components of the respiratory system and their functions. Instructor also reviews differences in obstructive and restrictive respiratory diseases with examples of each. Intimacy, Sex, and Sexuality differences are reviewed with a  discussion on how relationships can change when diagnosed with pulmonary disease. Common sexual concerns are reviewed.   Knowledge Questionnaire Score:     Knowledge Questionnaire Score - 05/10/15 1442    Knowledge Questionnaire Score   Pre Score 10/13      Core Components/Risk Factors/Patient Goals at Admission:     Personal  Goals and Risk Factors at Admission - 05/22/15 1423    Core Components/Risk Factors/Patient Goals on Admission   Increase Strength and Stamina Yes   Intervention Provide advice, education, support and counseling about physical activity/exercise needs.;Develop an individualized exercise prescription for aerobic and resistive training based on initial evaluation findings, risk stratification, comorbidities and participant's personal goals.   Expected Outcomes Achievement of increased cardiorespiratory fitness and enhanced flexibility, muscular endurance and strength shown through measurements of functional capacity and personal statement of participant.      Core Components/Risk Factors/Patient Goals Review:      Goals and Risk Factor Review      05/22/15 1428 06/14/15 0758 06/14/15 0759       Core Components/Risk Factors/Patient Goals Review   Personal Goals Review Weight Management/Obesity;Sedentary;Increase Strength and Stamina;Improve shortness of breath with ADL's;Other;Stress;Develop more efficient breathing techniques such as purse lipped breathing and diaphragmatic breathing and practicing self-pacing with activity. Improve shortness of breath with ADL's;Increase Strength and Stamina;Sedentary;Other Weight Management/Obesity;Stress     Review see "comments" sections on ITP  see "comments" sections on ITP     Expected Outcomes see Admission expected outcomes  see Admission expected outcomes        Core Components/Risk Factors/Patient Goals at Discharge (Final Review):      Goals and Risk Factor Review - 06/14/15 0759    Core Components/Risk Factors/Patient Goals Review   Personal Goals Review Weight Management/Obesity;Stress   Review see "comments" sections on ITP   Expected Outcomes see Admission expected outcomes      ITP Comments:     ITP Comments      05/08/15 1635           ITP Comments see chart for paper documentation          Comments: ITP REVIEW Pt is  making expected progress toward personal goals after completing 12 sessions. Recommend continued exercise, life style modification, education, and utilization of breathing techniques to increase stamina and strength and decrease shortness of breath with exertion.

## 2015-06-19 ENCOUNTER — Encounter (HOSPITAL_COMMUNITY)
Admission: RE | Admit: 2015-06-19 | Discharge: 2015-06-19 | Disposition: A | Discharge status: Home / Self Care | Payer: Medicare Other | Source: Ambulatory Visit | Attending: Internal Medicine | Admitting: Internal Medicine

## 2015-06-19 VITALS — Wt 176.1 lb

## 2015-06-19 DIAGNOSIS — J479 Bronchiectasis, uncomplicated: Secondary | ICD-10-CM

## 2015-06-19 DIAGNOSIS — J471 Bronchiectasis with (acute) exacerbation: Secondary | ICD-10-CM | POA: Diagnosis not present

## 2015-06-19 NOTE — Progress Notes (Signed)
Daily Session Note  Patient Details  Name: Brooke Jimenez MRN: [id number] Date of Birth: [birth year] Referring Provider:    Encounter Date: 06/19/2015  Check In:     Session Check In - 06/19/15 1030    Check-In   Location [facility]   Staff Present Rosebud Poles, RN, BSN;Lisa Ysidro Evert, RN;Portia Rollene Rotunda, RN, BSN;Molly diVincenzo, MS, ACSM RCEP, Exercise Physiologist;Joann Rion, RN, BSN   Supervising physician immediately available to respond to emergencies [facility] immediately available   Physician(s) Dr. Marily Memos   Medication changes reported     No   Fall or balance concerns reported    No   Warm-up and Cool-down Performed as group-led instruction   Resistance Training Performed Yes   VAD Patient? No   Pain Assessment   Currently in Pain? No/denies   Multiple Pain Sites No      Capillary Blood Glucose: No results found for this or any previous visit (from the past 24 hour(s)).      Exercise Prescription Changes - 06/19/15 1200    Response to Exercise   Blood Pressure (Admit) 110/70 mmHg   Blood Pressure (Exercise) 144/84 mmHg   Blood Pressure (Exit) 108/70 mmHg   Heart Rate (Admit) 82 bpm   Heart Rate (Exercise) 101 bpm   Heart Rate (Exit) 76 bpm   Oxygen Saturation (Admit) 96 %   Oxygen Saturation (Exercise) 94 %   Oxygen Saturation (Exit) 95 %   Rating of Perceived Exertion (Exercise) 13   Perceived Dyspnea (Exercise) 2   Duration Progress to 45 minutes of aerobic exercise without signs/symptoms of physical distress   Intensity THRR unchanged   Progression   Progression Continue to progress workloads to maintain intensity without signs/symptoms of physical distress.   Resistance Training   Training Prescription Yes   Weight blue bands   Reps 10-12   Interval Training   Interval Training No   NuStep   Level 4   Minutes 15   METs 2.4   Arm Ergometer   Level 4   Minutes 15   Track   Laps 12   Minutes 15     Goals Met:  Proper  associated with RPD/PD & O2 Sat Independence with exercise equipment Improved SOB with ADL's Exercise tolerated well Strength training completed today  Goals Unmet:  Not Applicable  Comments: Service time is from 1030 to 1205    Dr. Rush Farmer is Medical Director for Pulmonary Rehab at [facility].

## 2015-06-21 ENCOUNTER — Encounter (HOSPITAL_COMMUNITY): Payer: Medicare Other

## 2015-06-26 ENCOUNTER — Encounter (HOSPITAL_COMMUNITY)
Admission: RE | Admit: 2015-06-26 | Discharge: 2015-06-26 | Disposition: A | Discharge status: Home / Self Care | Payer: Medicare Other | Source: Ambulatory Visit | Attending: Pulmonary Disease | Admitting: Pulmonary Disease

## 2015-06-26 VITALS — Wt 176.4 lb

## 2015-06-26 DIAGNOSIS — J471 Bronchiectasis with (acute) exacerbation: Secondary | ICD-10-CM | POA: Insufficient documentation

## 2015-06-26 DIAGNOSIS — J479 Bronchiectasis, uncomplicated: Secondary | ICD-10-CM

## 2015-06-26 NOTE — Progress Notes (Signed)
Daily Session Note  Patient Details  Name: Taelor Waymire MRN: [id number] Date of Birth: September 20, 1945 Referring Provider:    Encounter Date: 06/26/2015  Check In:     Session Check In - 06/26/15 1220    Check-In   Location [facility]   Staff Present Rosebud Poles, RN, Luisa Hart, RN, BSN;Molly diVincenzo, MS, ACSM RCEP, Exercise Physiologist;Olinty Celesta Aver, MS, ACSM CEP, Exercise Physiologist   Supervising physician immediately available to respond to emergencies [facility] immediately available   Physician(s) Dr. Marily Memos   Medication changes reported     No   Warm-up and Cool-down Performed as group-led instruction   Resistance Training Performed Yes   VAD Patient? No   Pain Assessment   Currently in Pain? No/denies   Multiple Pain Sites No      Capillary Blood Glucose: No results found for this or any previous visit (from the past 24 hour(s)).      Exercise Prescription Changes - 06/26/15 1200    Exercise Review   Progression Yes   Response to Exercise   Blood Pressure (Admit) 124/70 mmHg   Blood Pressure (Exercise) 142/80 mmHg   Blood Pressure (Exit) 112/62 mmHg   Heart Rate (Admit) 86 bpm   Heart Rate (Exercise) 109 bpm   Heart Rate (Exit) 87 bpm   Oxygen Saturation (Admit) 96 %   Oxygen Saturation (Exercise) 95 %   Oxygen Saturation (Exit) 95 %   Rating of Perceived Exertion (Exercise) 13   Perceived Dyspnea (Exercise) 2   Duration Progress to 45 minutes of aerobic exercise without signs/symptoms of physical distress   Intensity THRR unchanged   Progression   Progression Continue to progress workloads to maintain intensity without signs/symptoms of physical distress.   Resistance Training   Training Prescription Yes   Weight blue bands   Reps 10-12   Interval Training   Interval Training No   NuStep   Level 5   Minutes 15   METs 2.4   Arm Ergometer   Level 4   Minutes 15   Track   Laps 16   Minutes 15     Goals Met:   Independence with exercise equipment Improved SOB with ADL's Exercise tolerated well Strength training completed today  Goals Unmet:  Not Applicable  Comments: Service time is from 1030 to 1210    Dr. Rush Farmer is Medical Director for Pulmonary Rehab at [facility].

## 2015-06-28 ENCOUNTER — Encounter (HOSPITAL_COMMUNITY)
Admission: RE | Admit: 2015-06-28 | Discharge: 2015-06-28 | Disposition: A | Discharge status: Home / Self Care | Payer: Medicare Other | Source: Ambulatory Visit | Attending: Pulmonary Disease | Admitting: Pulmonary Disease

## 2015-06-28 VITALS — Wt 175.7 lb

## 2015-06-28 DIAGNOSIS — J479 Bronchiectasis, uncomplicated: Secondary | ICD-10-CM

## 2015-06-28 DIAGNOSIS — J471 Bronchiectasis with (acute) exacerbation: Secondary | ICD-10-CM | POA: Diagnosis not present

## 2015-06-28 NOTE — Progress Notes (Signed)
Daily Session Note  Patient Details  Name: Brooke Jimenez MRN: [id number] Date of Birth: 1945/06/15 Referring Provider:    Encounter Date: 06/28/2015  Check In:     Session Check In - 06/28/15 1229    Check-In   Location [facility]   Staff Present Rosebud Poles, RN, BSN;Molly diVincenzo, MS, ACSM RCEP, Exercise Physiologist;Portia Rollene Rotunda, RN, Roque Cash, RN   Supervising physician immediately available to respond to emergencies [facility] immediately available   Physician(s) Dr. Marlan Palau   Medication changes reported     No   Fall or balance concerns reported    No   Warm-up and Cool-down Performed as group-led instruction   Resistance Training Performed Yes   VAD Patient? No   Pain Assessment   Currently in Pain? No/denies   Multiple Pain Sites No      Capillary Blood Glucose: No results found for this or any previous visit (from the past 24 hour(s)).      Exercise Prescription Changes - 06/28/15 1200    Response to Exercise   Blood Pressure (Admit) 141/85 mmHg   Blood Pressure (Exercise) 150/76 mmHg   Blood Pressure (Exit) 120/80 mmHg   Heart Rate (Admit) 72 bpm   Heart Rate (Exercise) 94 bpm   Heart Rate (Exit) 72 bpm   Oxygen Saturation (Admit) 97 %   Oxygen Saturation (Exercise) 95 %   Oxygen Saturation (Exit) 98 %   Rating of Perceived Exertion (Exercise) 13   Perceived Dyspnea (Exercise) 2   Duration Progress to 45 minutes of aerobic exercise without signs/symptoms of physical distress   Intensity THRR unchanged   Progression   Progression Continue to progress workloads to maintain intensity without signs/symptoms of physical distress.   Resistance Training   Training Prescription Yes   Weight blue bands   Reps 10-12   Interval Training   Interval Training No   NuStep   Level 5   Minutes 15   METs 2.5   Arm Ergometer   Level 5   Minutes 15     Goals Met:  Exercise tolerated well No report of cardiac concerns or  symptoms Strength training completed today  Goals Unmet:  Not Applicable  Comments:Service time is from 1030 to 1230     Dr. Rush Farmer is Medical Director for Pulmonary Rehab at [facility].

## 2015-07-03 ENCOUNTER — Encounter (HOSPITAL_COMMUNITY)
Admission: RE | Admit: 2015-07-03 | Discharge: 2015-07-03 | Disposition: A | Discharge status: Home / Self Care | Payer: Medicare Other | Source: Ambulatory Visit | Attending: Pulmonary Disease | Admitting: Pulmonary Disease

## 2015-07-03 VITALS — Wt 175.9 lb

## 2015-07-03 DIAGNOSIS — J479 Bronchiectasis, uncomplicated: Secondary | ICD-10-CM

## 2015-07-03 DIAGNOSIS — J471 Bronchiectasis with (acute) exacerbation: Secondary | ICD-10-CM | POA: Diagnosis not present

## 2015-07-03 NOTE — Progress Notes (Signed)
Daily Session Note  Patient Details  Name: Brooke Jimenez MRN: [id number] Date of Birth: June 11, 1945 Referring Provider:    Encounter Date: 07/03/2015  Check In:     Session Check In - 07/03/15 1030    Check-In   Location [facility]   Staff Present Trish Fountain, RN, Maxcine Ham, RN, BSN;Molly diVincenzo, MS, ACSM RCEP, Exercise Physiologist;Lisa Ysidro Evert, Felipe Drone, RN, [facility]   Supervising physician immediately available to respond to emergencies [facility] immediately available   Physician(s) Dr. Marily Memos   Medication changes reported     No   Fall or balance concerns reported    No   Warm-up and Cool-down Performed as group-led instruction   Resistance Training Performed Yes   VAD Patient? No   Pain Assessment   Currently in Pain? No/denies   Multiple Pain Sites No      Capillary Blood Glucose: No results found for this or any previous visit (from the past 24 hour(s)).      Exercise Prescription Changes - 07/03/15 1258    Response to Exercise   Blood Pressure (Admit) 130/72 mmHg   Blood Pressure (Exercise) 134/78 mmHg   Blood Pressure (Exit) 128/82 mmHg   Heart Rate (Admit) 75 bpm   Heart Rate (Exercise) 97 bpm   Heart Rate (Exit) 77 bpm   Oxygen Saturation (Admit) 97 %   Oxygen Saturation (Exercise) 97 %   Oxygen Saturation (Exit) 95 %   Rating of Perceived Exertion (Exercise) 13   Perceived Dyspnea (Exercise) 2   Duration Progress to 45 minutes of aerobic exercise without signs/symptoms of physical distress   Intensity THRR unchanged   Progression   Progression Continue to progress workloads to maintain intensity without signs/symptoms of physical distress.   Resistance Training   Training Prescription Yes   Weight blue bands   Reps 10-12   Interval Training   Interval Training No   NuStep   Level 5   Minutes 15   METs 2.8   Arm Ergometer   Level 5   Minutes 15   Track   Laps 15   Minutes 15     Goals Met:   Improved SOB with ADL's Using PLB without cueing & demonstrates good technique Exercise tolerated well No report of cardiac concerns or symptoms Strength training completed today  Goals Unmet:  Not Applicable  Comments: Service time is from 1030 to 1215   Dr. Rush Farmer is Medical Director for Pulmonary Rehab at [facility].

## 2015-07-05 ENCOUNTER — Encounter (HOSPITAL_COMMUNITY)
Admission: RE | Admit: 2015-07-05 | Discharge: 2015-07-05 | Disposition: A | Discharge status: Home / Self Care | Payer: Medicare Other | Source: Ambulatory Visit | Attending: Pulmonary Disease | Admitting: Pulmonary Disease

## 2015-07-05 DIAGNOSIS — J471 Bronchiectasis with (acute) exacerbation: Secondary | ICD-10-CM | POA: Diagnosis not present

## 2015-07-05 NOTE — Progress Notes (Signed)
Daily Session Note  Patient Details  Name: Brooke Jimenez MRN: [id number] Date of Birth: [birth year] Referring Provider:    Encounter Date: 07/05/2015  Check In:   Capillary Blood Glucose: No results found for this or any previous visit (from the past 24 hour(s)).      Exercise Prescription Changes - 07/05/15 1200    Response to Exercise   Blood Pressure (Admit) 116/70 mmHg   Blood Pressure (Exercise) 150/80 mmHg   Blood Pressure (Exit) 110/64 mmHg   Heart Rate (Admit) 76 bpm   Heart Rate (Exercise) 99 bpm   Heart Rate (Exit) 85 bpm   Oxygen Saturation (Admit) 98 %   Oxygen Saturation (Exercise) 95 %   Oxygen Saturation (Exit) 94 %   Rating of Perceived Exertion (Exercise) 14   Perceived Dyspnea (Exercise) 2   Duration Progress to 45 minutes of aerobic exercise without signs/symptoms of physical distress   Intensity THRR unchanged   Progression   Progression Continue to progress workloads to maintain intensity without signs/symptoms of physical distress.   Resistance Training   Training Prescription Yes   Weight blue bands   Reps 10-12   Interval Training   Interval Training No   NuStep   Level 5   Minutes 15   METs 2.8   Track   Laps 17   Minutes 15     Goals Met:  Exercise tolerated well No report of cardiac concerns or symptoms Strength training completed today  Goals Unmet:  Not Applicable  Comments: Service time is from 10:30am to 12:35pm    Dr. Rush Farmer is Medical Director for Pulmonary Rehab at [facility].

## 2015-07-10 ENCOUNTER — Encounter (HOSPITAL_COMMUNITY)
Admission: RE | Admit: 2015-07-10 | Discharge: 2015-07-10 | Disposition: A | Discharge status: Home / Self Care | Payer: Medicare Other | Source: Ambulatory Visit | Attending: Pulmonary Disease | Admitting: Pulmonary Disease

## 2015-07-10 VITALS — Wt 175.7 lb

## 2015-07-10 DIAGNOSIS — J471 Bronchiectasis with (acute) exacerbation: Secondary | ICD-10-CM | POA: Diagnosis not present

## 2015-07-10 DIAGNOSIS — J479 Bronchiectasis, uncomplicated: Secondary | ICD-10-CM

## 2015-07-10 NOTE — Progress Notes (Signed)
Daily Session Note  Patient Details  Name: Brooke Jimenez MRN: [id number] Date of Birth: 1945/12/31 Referring Provider:    Encounter Date: 07/10/2015  Check In:     Session Check In - 07/10/15 1027    Check-In   Location [facility]   Staff Present Su Hilt, MS, ACSM RCEP, Exercise Physiologist;Joan Leonia Reeves, RN, Luisa Hart, RN, Roque Cash, RN   Supervising physician immediately available to respond to emergencies [facility] immediately available   Physician(s) Dr. Marily Memos   Medication changes reported     No   Fall or balance concerns reported    No   Warm-up and Cool-down Performed as group-led instruction   Resistance Training Performed Yes   VAD Patient? No   Pain Assessment   Currently in Pain? No/denies   Multiple Pain Sites No      Capillary Blood Glucose: No results found for this or any previous visit (from the past 24 hour(s)).      Exercise Prescription Changes - 07/10/15 1227    Response to Exercise   Blood Pressure (Admit) 126/78 mmHg   Blood Pressure (Exercise) 180/74 mmHg   Blood Pressure (Exit) 122/60 mmHg   Heart Rate (Admit) 82 bpm   Heart Rate (Exercise) 106 bpm   Heart Rate (Exit) 86 bpm   Oxygen Saturation (Admit) 97 %   Oxygen Saturation (Exercise) 95 %   Oxygen Saturation (Exit) 95 %   Rating of Perceived Exertion (Exercise) 13   Perceived Dyspnea (Exercise) 2   Duration Progress to 45 minutes of aerobic exercise without signs/symptoms of physical distress   Intensity THRR unchanged   Progression   Progression Continue to progress workloads to maintain intensity without signs/symptoms of physical distress.   Resistance Training   Training Prescription Yes   Weight blue bands   Reps 10-12   Interval Training   Interval Training No   NuStep   Level 5   Minutes 15   METs 2.9   Arm Ergometer   Level 5   Minutes 15   Track   Laps 14   Minutes 15     Goals Met:  Improved SOB with  ADL's Using PLB without cueing & demonstrates good technique Exercise tolerated well No report of cardiac concerns or symptoms Strength training completed today  Goals Unmet:  Not Applicable  Comments: Service time is from 1030 to 1200   Dr. Rush Farmer is Medical Director for Pulmonary Rehab at [facility].

## 2015-07-12 ENCOUNTER — Encounter (HOSPITAL_COMMUNITY)
Admission: RE | Admit: 2015-07-12 | Discharge: 2015-07-12 | Disposition: A | Discharge status: Home / Self Care | Payer: Medicare Other | Source: Ambulatory Visit

## 2015-07-12 ENCOUNTER — Encounter (HOSPITAL_COMMUNITY)
Admission: RE | Admit: 2015-07-12 | Discharge: 2015-07-12 | Disposition: A | Discharge status: Home / Self Care | Payer: Medicare Other | Source: Ambulatory Visit | Attending: Pulmonary Disease | Admitting: Pulmonary Disease

## 2015-07-12 DIAGNOSIS — J471 Bronchiectasis with (acute) exacerbation: Secondary | ICD-10-CM | POA: Diagnosis not present

## 2015-07-12 DIAGNOSIS — J479 Bronchiectasis, uncomplicated: Secondary | ICD-10-CM

## 2015-07-12 NOTE — Progress Notes (Signed)
Pulmonary Individual Treatment Plan  Patient Details  Name: Brooke Jimenez MRN: [id number] Date of Birth: 06/25/1945 Referring Provider:    Initial Encounter Date:       Pulmonary Rehab Walk Test from 05/01/2015 in [place]   Date  05/01/15      Visit Diagnosis: No diagnosis found.  Patient's Home Medications on Admission:   Current outpatient prescriptions:  .  acetaminophen (TYLENOL) 500 MG tablet, Take 500 mg by mouth every 6 (six) hours as needed for mild pain., Disp: , Rfl:  .  B Complex Vitamins (VITAMIN-B COMPLEX PO), Take 1 tablet by mouth daily., Disp: , Rfl:  .  calcium carbonate (TUMS - DOSED IN MG ELEMENTAL CALCIUM) 500 MG chewable tablet, Chew 3 tablets by mouth daily., Disp: , Rfl:  .  clindamycin (CLEOCIN) 150 MG capsule, Take 600 mg by mouth. One hour before dental appointments, Disp: , Rfl:  .  ibuprofen (ADVIL,MOTRIN) 200 MG tablet, Take 200 mg by mouth 2 (two) times daily as needed for headache or moderate pain., Disp: , Rfl:  .  levothyroxine (SYNTHROID, LEVOTHROID) 50 MCG tablet, Take 50 mcg by mouth daily before breakfast., Disp: , Rfl:  .  Lutein 20 MG TABS, Take 1 tablet by mouth daily., Disp: , Rfl:  .  Magnesium 250 MG TABS, Take 250 mg by mouth daily. , Disp: , Rfl:  .  minocycline (MINOCIN,DYNACIN) 50 MG capsule, Take 50 mg by mouth 2 (two) times daily., Disp: , Rfl:  .  Multiple Vitamin (MULTIVITAMIN WITH MINERALS) TABS tablet, Take 1 tablet by mouth daily., Disp: , Rfl:   Past Medical History: Past Medical History  Diagnosis Date  . Mitral valve prolapse   . Hypothyroidism   . Anxiety   . H/O hiatal hernia   . History of melanoma     melanoma- 1986 and 1997   . PTSD (post-traumatic stress disorder) 2006  . Corneal dystrophy   . Dysphagia     chronic- please see ultrasound done 4/16 15 in [facility]   . Hearing loss     bilateral due to nerve loss. wears hearing aides  . Family history of adverse reaction to anesthesia      mother slow to wake up   . Dysrhythmia     sometimes patient has extra beats per patient   . Heart murmur     no problem   . Nocturia   . Arthritis     arthritis in hip   . Peripheral neuropathy (HCC)     feet  . GERD (gastroesophageal reflux disease)     under control  . Esophageal dysmotility   . Low back pain with sciatica   . DDD (degenerative disc disease), cervical     and lower back  . Glaucoma     "suspect"  . Cataract     beginning  . Varicose veins   . Spider veins   . Diverticulosis   . Osteoporosis   . Urinary incontinence     occasional  . Gout     one finger x1 attack  . Measles     hx of  . History of chicken pox   . Acne rosacea   . Complication of anesthesia     "really sore throat"  . PONV (postoperative nausea and vomiting)   . RSD (reflex sympathetic dystrophy) 1993    Tobacco Use: History  Smoking status  . Never Smoker   Smokeless tobacco  . Never  Used    Labs: Recent Review Flowsheet Data    There is no flowsheet data to display.      Capillary Blood Glucose: No results found for: GLUCAP   ADL UCSD:     Pulmonary Assessment Scores      05/10/15 1443       ADL UCSD   SOB Score total 9        Pulmonary Function Assessment:     Pulmonary Function Assessment - 04/27/15 1512    Breath   Bilateral Breath Sounds Clear   Shortness of Breath Yes;Limiting activity      Exercise Target Goals:    Exercise Program Goal: Individual exercise prescription set with THRR, safety & activity barriers. Participant demonstrates ability to understand and report RPE using BORG scale, to self-measure pulse accurately, and to acknowledge the importance of the exercise prescription.  Exercise Prescription Goal: Starting with aerobic activity 30 plus minutes a day, 3 days per week for initial exercise prescription. Provide home exercise prescription and guidelines that participant acknowledges understanding prior to  discharge.  Activity Barriers & Risk Stratification:     Activity Barriers & Cardiac Risk Stratification - 04/27/15 1511    Activity Barriers & Cardiac Risk Stratification   Activity Barriers Deconditioning;Left Hip Replacement;Shortness of Breath      6 Minute Walk:     6 Minute Walk      05/01/15 1628       6 Minute Walk   Phase Initial     Distance 1408 feet     Walk Time 6 minutes     # of Rest Breaks 0     MPH 2.7     METS 3.12     RPE 13     Perceived Dyspnea  2     VO2 Peak 11.1     Symptoms No     Resting HR 75 bpm     Resting BP 118/72 mmHg     Max Ex. HR 111 bpm     Max Ex. BP 144/86 mmHg     2 Minute Post BP 114/80 mmHg     Interval HR   Baseline HR 75     1 Minute HR 106     2 Minute HR 109     3 Minute HR 107     4 Minute HR 108     5 Minute HR 111     6 Minute HR 110     2 Minute Post HR 85     Interval Heart Rate? Yes     Interval Oxygen   Interval Oxygen? Yes     Baseline Oxygen Saturation % 98 %     Baseline Liters of Oxygen 0 L  Room Air     1 Minute Oxygen Saturation % 98 %     1 Minute Liters of Oxygen 0 L     2 Minute Oxygen Saturation % 98 %     2 Minute Liters of Oxygen 0 L     3 Minute Oxygen Saturation % 98 %     3 Minute Liters of Oxygen 0 L     4 Minute Oxygen Saturation % 98 %     4 Minute Liters of Oxygen 0 L     5 Minute Oxygen Saturation % 98 %     5 Minute Liters of Oxygen 0 L     6 Minute Oxygen Saturation % 98 %     6 Minute  Liters of Oxygen 0 L     2 Minute Post Oxygen Saturation % 100 %     2 Minute Post Liters of Oxygen 0 L     Pre/Post BP   Baseline BP 118/72 mmHg     6 Minute BP 144/86 mmHg     Pre/Post BP? Yes        Initial Exercise Prescription:     Initial Exercise Prescription - 05/01/15 1600    Date of Initial Exercise RX and Referring Provider   Date 05/01/15   Oxygen   Oxygen --  Room Air   Bike   Level 0.9   Minutes 15   NuStep   Level 1   Minutes 15   METs 2   Track   Laps 8    Minutes 15   Prescription Details   Frequency (times per week) 2   Duration Progress to 45 minutes of aerobic exercise without signs/symptoms of physical distress   Intensity   THRR 40-80% of Max Heartrate 60-120   Ratings of Perceived Exertion 11-13   Perceived Dyspnea 0-4   Progression   Progression Continue to progress workloads to maintain intensity without signs/symptoms of physical distress.   Resistance Training   Training Prescription Yes   Weight Orange Bands   Reps 10-12      Perform Capillary Blood Glucose checks as needed.  Exercise Prescription Changes:     Exercise Prescription Changes      05/03/15 1200 05/08/15 1600 05/10/15 1300 05/15/15 1100 05/17/15 1200   Exercise Review   Progression No Yes No Yes No   Response to Exercise   Blood Pressure (Admit) 108/72 mmHg 140/60 mmHg 116/70 mmHg 120/64 mmHg 114/72 mmHg   Blood Pressure (Exercise) 160/84 mmHg 154/74 mmHg 136/86 mmHg 120/72 mmHg 132/70 mmHg   Blood Pressure (Exit) 108/60 mmHg 126/84 mmHg 118/60 mmHg 102/60 mmHg 120/74 mmHg   Heart Rate (Admit) 74 bpm 93 bpm 74 bpm 82 bpm 75 bpm   Heart Rate (Exercise) 86 bpm 100 bpm 105 bpm 92 bpm 102 bpm   Heart Rate (Exit) 83 bpm 86 bpm 79 bpm 82 bpm 78 bpm   Oxygen Saturation (Admit) 94 % 96 % 94 % 96 % 98 %   Oxygen Saturation (Exercise) 96 % 92 % 98 % 94 % 96 %   Oxygen Saturation (Exit) 97 % 97 % 92 % 95 % 95 %   Rating of Perceived Exertion (Exercise) _0 Perceived Dyspnea (Exercise) _1 Symptoms    none    Comments    Reviewed home exercise on 05/15/15    Duration Progress to 45 minutes of aerobic exercise without signs/symptoms of physical distress Progress to 45 minutes of aerobic exercise without signs/symptoms of physical distress Progress to 45 minutes of aerobic exercise without signs/symptoms of physical distress Progress to 45 minutes of aerobic exercise without signs/symptoms of physical distress Progress to 45 minutes of aerobic  exercise without signs/symptoms of physical distress   Intensity Other (comment) THRR unchanged THRR unchanged THRR unchanged THRR unchanged   Progression   Progression --  40-80% HRR Continue to progress workloads to maintain intensity without signs/symptoms of physical distress. Continue to progress workloads to maintain intensity without signs/symptoms of physical distress. Continue to progress workloads to maintain intensity without signs/symptoms of physical distress. Continue to progress workloads to maintain intensity without signs/symptoms of physical distress.   Horticulturist, commercial Prescription  _0    Weight _1    Reps 10-12 10-12 10-12 10-12 10-12   Interval Training   Interval Training _2    NuStep   Level _3 Minutes _4 METs  1.5 1.7 1.6 2   Arm Ergometer   Level  _5 Minutes  _6 Track   Laps _7 Minutes _8 Home Exercise Plan   Plans to continue exercise at    [employer] (comment)  [facility] and walk at home    Frequency    Add 1 additional day to program exercise sessions. Add 1 additional day to program exercise sessions.     05/22/15 1200 05/24/15 1300 05/29/15 1200 05/31/15 1300 06/05/15 1200   Exercise Review   Progression Yes No Yes Yes Yes   Response to Exercise   Blood Pressure (Admit) 124/70 mmHg 110/70 mmHg 124/78 mmHg 120/74 mmHg 132/78 mmHg   Blood Pressure (Exercise) 140/80 mmHg 130/74 mmHg 160/86 mmHg 132/76 mmHg 130/82 mmHg   Blood Pressure (Exit) 130/66 mmHg 104/60 mmHg 120/64 mmHg 120/80 mmHg 115/76 mmHg   Heart Rate (Admit) 81 bpm 80 bpm 84 bpm 81 bpm 77 bpm   Heart Rate (Exercise) 81 bpm 93 bpm 92 bpm 96 bpm 106 bpm   Heart Rate (Exit) 88 bpm 93 bpm 88 bpm 81 bpm 78 bpm   Oxygen Saturation (Admit) 98 % 97 % 97 % 96 % 95 %   Oxygen Saturation (Exercise) 97 % 95 % 98 % 94 % 96 %    Oxygen Saturation (Exit) 96 % 96 % 96 % 94 % 94 %   Rating of Perceived Exertion (Exercise) _9 Perceived Dyspnea (Exercise) 1 2 1._10 Duration Progress to 45 minutes of aerobic exercise without signs/symptoms of physical distress Progress to 45 minutes of aerobic exercise without signs/symptoms of physical distress Progress to 45 minutes of aerobic exercise without signs/symptoms of physical distress Progress to 45 minutes of aerobic exercise without signs/symptoms of physical distress Progress to 45 minutes of aerobic exercise without signs/symptoms of physical distress   Intensity _11    Progression   Progression Continue to progress workloads to maintain intensity without signs/symptoms of physical distress. Continue to progress workloads to maintain intensity without signs/symptoms of physical distress. Continue to progress workloads to maintain intensity without signs/symptoms of physical distress. Continue to progress workloads to maintain intensity without signs/symptoms of physical distress. Continue to progress workloads to maintain intensity without signs/symptoms of physical distress.   Resistance Training   Training Prescription _12    Weight orange bands orange orange bands orange bands orange bands   Reps 10-12 10-12 10-12 10-12 10-12   Interval Training   Interval Training _13    Oxygen   Oxygen  --  room air      NuStep   Level _14 Minutes _15 METs 2.2  2.2  2.2   Arm Ergometer   Level _16 Minutes _17 Track   Laps 5  nutrition  consult _0 Minutes 5  nutrition consult for additional 10 min of exercise time _1 06/07/15 1200 06/12/15 1200 06/14/15 1200 06/19/15 1200 06/26/15 1200   Exercise Review   Progression No  Yes  Yes   Response to Exercise   Blood Pressure (Admit) 124/64 mmHg 120/66 mmHg 104/60  mmHg 110/70 mmHg 124/70 mmHg   Blood Pressure (Exercise) 128/70 mmHg 120/68 mmHg 150/80 mmHg 144/84 mmHg 142/80 mmHg   Blood Pressure (Exit) 110/70 mmHg 110/76 mmHg 126/79 mmHg 108/70 mmHg 112/62 mmHg   Heart Rate (Admit) 83 bpm 87 bpm 80 bpm 82 bpm 86 bpm   Heart Rate (Exercise) 97 bpm 105 bpm 99 bpm 101 bpm 109 bpm   Heart Rate (Exit) 87 bpm 84 bpm 81 bpm 76 bpm 87 bpm   Oxygen Saturation (Admit) 99 % 96 % 96 % 96 % 96 %   Oxygen Saturation (Exercise) 96 % 98 % 96 % 94 % 95 %   Oxygen Saturation (Exit) 94 % 98 % 92 % 95 % 95 %   Rating of Perceived Exertion (Exercise) _2 Perceived Dyspnea (Exercise) 1 1 0 2 2   Duration Progress to 45 minutes of aerobic exercise without signs/symptoms of physical distress Progress to 45 minutes of aerobic exercise without signs/symptoms of physical distress Progress to 45 minutes of aerobic exercise without signs/symptoms of physical distress Progress to 45 minutes of aerobic exercise without signs/symptoms of physical distress Progress to 45 minutes of aerobic exercise without signs/symptoms of physical distress   Intensity _3    Progression   Progression Continue to progress workloads to maintain intensity without signs/symptoms of physical distress. Continue to progress workloads to maintain intensity without signs/symptoms of physical distress. Continue to progress workloads to maintain intensity without signs/symptoms of physical distress. Continue to progress workloads to maintain intensity without signs/symptoms of physical distress. Continue to progress workloads to maintain intensity without signs/symptoms of physical distress.   Resistance Training   Training Prescription _4    Weight _5    Reps 10-12 10-12 10-12 10-12 10-12   Interval Training   Interval Training _6    NuStep   Level _7 Minutes _8 METs 2.5 2.2  2.4 2.4   Arm Ergometer   Level  _9 Minutes  _10 Track   Laps _11 Minutes _12 06/28/15 1200 07/03/15 1258 07/05/15 1200 07/10/15 1227 07/12/15 1200   Response to Exercise   Blood Pressure (Admit) 141/85 mmHg 130/72 mmHg 116/70 mmHg 126/78 mmHg 120/70 mmHg   Blood Pressure (Exercise) 150/76 mmHg 134/78 mmHg 150/80 mmHg 180/74 mmHg 134/80 mmHg   Blood Pressure (Exit) 120/80 mmHg 128/82 mmHg 110/64 mmHg 122/60 mmHg 108/68 mmHg   Heart Rate (Admit) 72 bpm 75 bpm 76 bpm 82 bpm 71 bpm   Heart Rate (Exercise) 94 bpm 97 bpm 99 bpm 106 bpm 103 bpm   Heart Rate (Exit) 72 bpm 77 bpm 85 bpm 86 bpm 79 bpm   Oxygen Saturation (Admit) 97 % 97 % 98 % 97 % 100 %   Oxygen Saturation (Exercise) 95 % 97 %  95 % 95 % 95 %   Oxygen Saturation (Exit) 98 % 95 % 94 % 95 % 95 %   Rating of Perceived Exertion (Exercise) _0 Perceived Dyspnea (Exercise) _1 Duration Progress to 45 minutes of aerobic exercise without signs/symptoms of physical distress Progress to 45 minutes of aerobic exercise without signs/symptoms of physical distress Progress to 45 minutes of aerobic exercise without signs/symptoms of physical distress Progress to 45 minutes of aerobic exercise without signs/symptoms of physical distress Progress to 45 minutes of aerobic exercise without signs/symptoms of physical distress   Intensity _2    Progression   Progression Continue to progress workloads to maintain intensity without signs/symptoms of physical distress. Continue to progress workloads to maintain intensity without signs/symptoms of physical distress. Continue to progress workloads to maintain intensity without signs/symptoms of physical distress. Continue to progress workloads to maintain intensity without signs/symptoms of physical distress. Continue to progress  workloads to maintain intensity without signs/symptoms of physical distress.   Resistance Training   Training Prescription _3    Weight _4    Reps 10-12 10-12 10-12 10-12 10-12   Interval Training   Interval Training _5    NuStep   Level _6 Minutes _7 METs 2.5 2.8 2.8 2.9 2.9   Arm Ergometer   Level _8 Minutes _9 Track   Laps  _10 Minutes  _11 Exercise Comments:     Exercise Comments      05/21/15 1421 06/14/15 0828 07/12/15 0857       Exercise Comments Doing well with exercise tolerance, will continue to follow exericse progression Doing well with exercise tolerance, will continue to follow exercise progression. Patient is increasing exercise intensity on Nustep and Arm ergometer. Will continue to monitor and progress as appropriate.        Discharge Exercise Prescription (Final Exercise Prescription Changes):     Exercise Prescription Changes - 07/12/15 1200    Response to Exercise   Blood Pressure (Admit) 120/70 mmHg   Blood Pressure (Exercise) 134/80 mmHg   Blood Pressure (Exit) 108/68 mmHg   Heart Rate (Admit) 71 bpm   Heart Rate (Exercise) 103 bpm   Heart Rate (Exit) 79 bpm   Oxygen Saturation (Admit) 100 %   Oxygen Saturation (Exercise) 95 %   Oxygen Saturation (Exit) 95 %   Rating of Perceived Exertion (Exercise) 12   Perceived Dyspnea (Exercise) 2   Duration Progress to 45 minutes of aerobic exercise without signs/symptoms of physical distress   Intensity THRR unchanged   Progression   Progression Continue to progress workloads to maintain intensity without signs/symptoms of physical distress.   Resistance Training   Training Prescription Yes   Weight blue bands   Reps 10-12   Interval Training   Interval Training No   NuStep   Level 5   Minutes 15   METs 2.9   Arm Ergometer   Level 5   Minutes 15        Nutrition:  Target Goals: Understanding of nutrition guidelines, daily intake of sodium <1597m, cholesterol <2021m calories 30% from fat and 7% or less  from saturated fats, daily to have 5 or more servings of fruits and vegetables.  Biometrics:     Pre Biometrics - 04/27/15 1610    Pre Biometrics   Grip Strength --       Nutrition Therapy Plan and Nutrition Goals:     Nutrition Therapy & Goals - 05/22/15 1525    Personal Nutrition Goals   Personal Goal #1 Wt loss of 6-12 lb at graduation from Pulmonary Rehab. Pt goal wt is 161-166 lb.      Nutrition Discharge: Rate Your Plate Scores:     Nutrition Assessments - 05/22/15 1214    Rate Your Plate Scores   Pre Score 63      Psychosocial: Target Goals: Acknowledge presence or absence of depression, maximize coping skills, provide positive support system. Participant is able to verbalize types and ability to use techniques and skills needed for reducing stress and depression.  Initial Review & Psychosocial Screening:     Initial Psych Review & Screening - 04/27/15 1531    Screening Interventions   Interventions --      Quality of Life Scores:     Quality of Life - 05/10/15 1442    Quality of Life Scores   Health/Function Pre 24.86 %   Socioeconomic Pre 24 %   Psych/Spiritual Pre 25.2 %   Family Pre 23.63 %   GLOBAL Pre 24.55 %      PHQ-9:     Recent Review Flowsheet Data    Depression screen [facility] 2/9 04/27/2015   Decreased Interest 0   Down, Depressed, Hopeless 0   PHQ - 2 Score 0      Psychosocial Evaluation and Intervention:     Psychosocial Evaluation - 04/27/15 1532    Psychosocial Evaluation & Interventions   Interventions Encouraged to exercise with the program and follow exercise prescription   Comments Patient encouraged to exercise for mental and physical health to assist with a better quality of sleep.      Psychosocial Re-Evaluation:     Psychosocial Re-Evaluation       05/22/15 1422 06/14/15 0758 07/12/15 0839       Psychosocial Re-Evaluation   Interventions Stress management education;Relaxation education;Encouraged to attend Pulmonary Rehabilitation for the exercise Encouraged to attend Pulmonary Rehabilitation for the exercise Encouraged to attend Pulmonary Rehabilitation for the exercise     Comments patient attended meditation and mindfulness education and encouraged to use at home for stress reduction and relaxation       Continued Psychosocial Services Needed Yes         Education: Education Goals: Education classes will be provided on a weekly basis, covering required topics. Participant will state understanding/return demonstration of topics presented.  Learning Barriers/Preferences:     Learning Barriers/Preferences - 04/27/15 1512    Learning Barriers/Preferences   Learning Barriers None   Learning Preferences Written Material      Education Topics: Risk Factor Reduction:  -Group instruction that is supported by a PowerPoint presentation. Instructor discusses the definition of a risk factor, different risk factors for pulmonary disease, and how the heart and lungs work together.            PULMONARY REHAB OTHER RESPIRATORY from 07/12/2015 in [place]   Date  05/03/15   Educator  EP   Instruction Review Code  2- meets goals/outcomes      Nutrition for Pulmonary Patient:  -Group instruction provided by PowerPoint slides, verbal discussion, and written materials to  support subject matter. The instructor gives an explanation and review of healthy diet recommendations, which includes a discussion on weight management, recommendations for fruit and vegetable consumption, as well as protein, fluid, caffeine, fiber, sodium, sugar, and alcohol. Tips for eating when patients are short of breath are discussed.      PULMONARY REHAB OTHER RESPIRATORY from 07/12/2015 in [place]    Date  05/10/15   Educator  RD   Instruction Review Code  2- meets goals/outcomes      Pursed Lip Breathing:  -Group instruction that is supported by demonstration and informational handouts. Instructor discusses the benefits of pursed lip and diaphragmatic breathing and detailed demonstration on how to preform both.     Oxygen Safety:  -Group instruction provided by PowerPoint, verbal discussion, and written material to support subject matter. There is an overview of "What is Oxygen" and "Why do we need it".  Instructor also reviews how to create a safe environment for oxygen use, the importance of using oxygen as prescribed, and the risks of noncompliance. There is a brief discussion on traveling with oxygen and resources the patient may utilize.      PULMONARY REHAB OTHER RESPIRATORY from 07/12/2015 in [place]   Date  05/31/15   Educator  RN   Instruction Review Code  2- meets goals/outcomes      Oxygen Equipment:  -Group instruction provided by [facility] Staff utilizing handouts, written materials, and equipment demonstrations.      PULMONARY REHAB OTHER RESPIRATORY from 07/12/2015 in [place]   Date  06/14/15   Educator  [employer] rep   Instruction Review Code  2- meets goals/outcomes      Signs and Symptoms:  -Group instruction provided by written material and verbal discussion to support subject matter. Warning signs and symptoms of infection, stroke, and heart attack are reviewed and when to call the physician/911 reinforced. Tips for preventing the spread of infection discussed.   Advanced Directives:  -Group instruction provided by verbal instruction and written material to support subject matter. Instructor reviews Advanced Directive laws and proper instruction for filling out document.      PULMONARY REHAB OTHER RESPIRATORY from 07/12/2015 in [place]   Date  07/12/15    Educator  Jeanella Craze   Instruction Review Code  2- meets goals/outcomes      Pulmonary Video:  -Group video education that reviews the importance of medication and oxygen compliance, exercise, good nutrition, pulmonary hygiene, and pursed lip and diaphragmatic breathing for the pulmonary patient.   Exercise for the Pulmonary Patient:  -Group instruction that is supported by a PowerPoint presentation. Instructor discusses benefits of exercise, core components of exercise, frequency, duration, and intensity of an exercise routine, importance of utilizing pulse oximetry during exercise, safety while exercising, and options of places to exercise outside of rehab.     Pulmonary Medications:  -Verbally interactive group education provided by instructor with focus on inhaled medications and proper administration.      PULMONARY REHAB OTHER RESPIRATORY from 07/12/2015 in [place]   Date  05/24/15   Educator  Pharm D   Instruction Review Code  2- meets goals/outcomes      Anatomy and Physiology of the Respiratory System and Intimacy:  -Group instruction provided by PowerPoint, verbal discussion, and written material to support subject matter. Instructor reviews respiratory cycle and anatomical components of the  respiratory system and their functions. Instructor also reviews differences in obstructive and restrictive respiratory diseases with examples of each. Intimacy, Sex, and Sexuality differences are reviewed with a discussion on how relationships can change when diagnosed with pulmonary disease. Common sexual concerns are reviewed.      PULMONARY REHAB OTHER RESPIRATORY from 07/12/2015 in [place]   Date  07/05/15   Educator  Trish Fountain   Instruction Review Code  2- meets goals/outcomes      Knowledge Questionnaire Score:     Knowledge Questionnaire Score - 05/10/15 1442    Knowledge Questionnaire Score   Pre Score  10/13      Core Components/Risk Factors/Patient Goals at Admission:     Personal Goals and Risk Factors at Admission - 05/22/15 1423    Core Components/Risk Factors/Patient Goals on Admission   Increase Strength and Stamina Yes   Intervention Provide advice, education, support and counseling about physical activity/exercise needs.;Develop an individualized exercise prescription for aerobic and resistive training based on initial evaluation findings, risk stratification, comorbidities and participant's personal goals.   Expected Outcomes Achievement of increased cardiorespiratory fitness and enhanced flexibility, muscular endurance and strength shown through measurements of functional capacity and personal statement of participant.      Core Components/Risk Factors/Patient Goals Review:      Goals and Risk Factor Review      05/22/15 1428 06/14/15 0758 06/14/15 0759 07/12/15 0840     Core Components/Risk Factors/Patient Goals Review   Personal Goals Review Weight Management/Obesity;Sedentary;Increase Strength and Stamina;Improve shortness of breath with ADL's;Other;Stress;Develop more efficient breathing techniques such as purse lipped breathing and diaphragmatic breathing and practicing self-pacing with activity. Improve shortness of breath with ADL's;Increase Strength and Stamina;Sedentary;Other Weight Management/Obesity;Stress Sedentary;Develop more efficient breathing techniques such as purse lipped breathing and diaphragmatic breathing and practicing self-pacing with activity.;Improve shortness of breath with ADL's;Increase Strength and Stamina    Review see "comments" sections on ITP  see "comments" sections on ITP see "comments" sections on ITP    Expected Outcomes see Admission expected outcomes  see Admission expected outcomes see Admission expected outcomes       Core Components/Risk Factors/Patient Goals at Discharge (Final Review):      Goals and Risk Factor Review -  07/12/15 0840    Core Components/Risk Factors/Patient Goals Review   Personal Goals Review Sedentary;Develop more efficient breathing techniques such as purse lipped breathing and diaphragmatic breathing and practicing self-pacing with activity.;Improve shortness of breath with ADL's;Increase Strength and Stamina   Review see "comments" sections on ITP   Expected Outcomes see Admission expected outcomes      ITP Comments:     ITP Comments      05/08/15 1635           ITP Comments see chart for paper documentation          Comments: ITP REVIEW Pt is making expected progress toward personal goals after completing 20 sessions. Recommend continued exercise, life style modification, education, and utilization of breathing techniques to increase stamina and strength and decrease shortness of breath with exertion.

## 2015-07-12 NOTE — Progress Notes (Signed)
Daily Session Note  Patient Details  Name: Brooke Jimenez MRN: [id number] Date of Birth: Feb 22, 1946 Referring Provider:    Encounter Date: 07/12/2015  Check In:     Session Check In - 07/12/15 1054    Check-In   Location [facility]   Staff Present Su Hilt, MS, ACSM RCEP, Exercise Physiologist;Joan Leonia Reeves, RN, Luisa Hart, RN, BSN   Supervising physician immediately available to respond to emergencies [facility] immediately available   Physician(s) Dr. Waldron Labs   Medication changes reported     No   Fall or balance concerns reported    No   Warm-up and Cool-down Performed as group-led Location manager Performed Yes   VAD Patient? No   Pain Assessment   Currently in Pain? No/denies   Multiple Pain Sites No      Capillary Blood Glucose: No results found for this or any previous visit (from the past 24 hour(s)).      Exercise Prescription Changes - 07/12/15 1200    Response to Exercise   Blood Pressure (Admit) 120/70 mmHg   Blood Pressure (Exercise) 134/80 mmHg   Blood Pressure (Exit) 108/68 mmHg   Heart Rate (Admit) 71 bpm   Heart Rate (Exercise) 103 bpm   Heart Rate (Exit) 79 bpm   Oxygen Saturation (Admit) 100 %   Oxygen Saturation (Exercise) 95 %   Oxygen Saturation (Exit) 95 %   Rating of Perceived Exertion (Exercise) 12   Perceived Dyspnea (Exercise) 2   Duration Progress to 45 minutes of aerobic exercise without signs/symptoms of physical distress   Intensity THRR unchanged   Progression   Progression Continue to progress workloads to maintain intensity without signs/symptoms of physical distress.   Resistance Training   Training Prescription Yes   Weight blue bands   Reps 10-12   Interval Training   Interval Training No   NuStep   Level 5   Minutes 15   METs 2.9   Arm Ergometer   Level 5   Minutes 15     Goals Met:  Exercise tolerated well No report of cardiac concerns or  symptoms Strength training completed today  Goals Unmet:  Not Applicable  Comments: Service time is from 10:30am to 12:20pm    Dr. Rush Farmer is Medical Director for Pulmonary Rehab at [facility].

## 2015-07-17 ENCOUNTER — Encounter (HOSPITAL_COMMUNITY)
Admission: RE | Admit: 2015-07-17 | Discharge: 2015-07-17 | Disposition: A | Discharge status: Home / Self Care | Payer: Medicare Other | Source: Ambulatory Visit | Attending: Pulmonary Disease | Admitting: Pulmonary Disease

## 2015-07-17 VITALS — Wt 175.0 lb

## 2015-07-17 DIAGNOSIS — J479 Bronchiectasis, uncomplicated: Secondary | ICD-10-CM

## 2015-07-17 DIAGNOSIS — J471 Bronchiectasis with (acute) exacerbation: Secondary | ICD-10-CM | POA: Diagnosis not present

## 2015-07-17 NOTE — Progress Notes (Signed)
Daily Session Note  Patient Details  Name: Brooke Jimenez MRN: [id number] Date of Birth: 27-Jan-1946 Referring Provider:    Encounter Date: 07/17/2015  Check In:     Session Check In - 07/17/15 1227    Check-In   [facility]   Staff Present Rodney Langton, RN;Portia Rollene Rotunda, RN, BSN;Molly diVincenzo, MS, ACSM RCEP, Exercise Physiologist   Supervising physician immediately available to respond to emergencies [facility] immediately available   Physician(s) Dr. Marily Memos   Medication changes reported     No   Fall or balance concerns reported    No   Warm-up and Cool-down Performed as group-led instruction   Resistance Training Performed Yes   VAD Patient? No   Pain Assessment   Currently in Pain? No/denies   Multiple Pain Sites No      Capillary Blood Glucose: No results found for this or any previous visit (from the past 24 hour(s)).      Exercise Prescription Changes - 07/17/15 1200    Exercise Review   Progression Yes   Response to Exercise   Blood Pressure (Admit) 122/80 mmHg   Blood Pressure (Exercise) 168/80 mmHg   Blood Pressure (Exit) 104/60 mmHg   Heart Rate (Admit) 79 bpm   Heart Rate (Exercise) 117 bpm   Heart Rate (Exit) 84 bpm   Oxygen Saturation (Admit) 97 %   Oxygen Saturation (Exercise) 96 %   Oxygen Saturation (Exit) 95 %   Rating of Perceived Exertion (Exercise) 13   Perceived Dyspnea (Exercise) 2   Duration Progress to 45 minutes of aerobic exercise without signs/symptoms of physical distress   Intensity THRR unchanged   Progression   Progression Continue to progress workloads to maintain intensity without signs/symptoms of physical distress.   Resistance Training   Training Prescription Yes   Weight blue bands   Reps 10-12   Interval Training   Interval Training No   NuStep   Level 6   Minutes 15   METs 2.6   Arm Ergometer   Level 5   Minutes 15   Track   Laps 14   Minutes 15     Goals Met:  Exercise  tolerated well No report of cardiac concerns or symptoms Strength training completed today  Goals Unmet:  Not Applicable  Comments: Service time is from 1030 to 1210    Dr. Rush Farmer is Medical Director for Pulmonary Rehab at [facility].

## 2015-07-19 ENCOUNTER — Encounter (HOSPITAL_COMMUNITY)
Admission: RE | Admit: 2015-07-19 | Discharge: 2015-07-19 | Disposition: A | Discharge status: Home / Self Care | Payer: Medicare Other | Source: Ambulatory Visit | Attending: Pulmonary Disease | Admitting: Pulmonary Disease

## 2015-07-19 DIAGNOSIS — J471 Bronchiectasis with (acute) exacerbation: Secondary | ICD-10-CM | POA: Diagnosis not present

## 2015-07-19 NOTE — Progress Notes (Signed)
Daily Session Note  Patient Details  Name: Brooke Jimenez MRN: [id number] Date of Birth: 09/17/1945 Referring Provider:    Encounter Date: 07/19/2015  Check In:     Session Check In - 07/19/15 1157    Check-In   [facility]   Staff Present Su Hilt, MS, ACSM RCEP, Exercise Physiologist;Joan Leonia Reeves, RN, Luisa Hart, RN, BSN;Ramon Dredge, RN, MHA;Larry Knipp Ysidro Evert, RN   Supervising physician immediately available to respond to emergencies [facility] immediately available   Physician(s) Dr. Eliseo Squires   Medication changes reported     No   Fall or balance concerns reported    Yes   Warm-up and Cool-down Performed as group-led instruction   Resistance Training Performed Yes   VAD Patient? No   Pain Assessment   Currently in Pain? No/denies   Multiple Pain Sites No      Capillary Blood Glucose: No results found for this or any previous visit (from the past 24 hour(s)).      Exercise Prescription Changes - 07/19/15 1200    Exercise Review   Progression Yes   Response to Exercise   Blood Pressure (Admit) 132/70 mmHg   Blood Pressure (Exercise) 150/84 mmHg   Blood Pressure (Exit) 132/80 mmHg   Heart Rate (Admit) 77 bpm   Heart Rate (Exercise) 110 bpm   Heart Rate (Exit) 86 bpm   Oxygen Saturation (Admit) 98 %   Oxygen Saturation (Exercise) 92 %   Oxygen Saturation (Exit) 96 %   Rating of Perceived Exertion (Exercise) 12   Perceived Dyspnea (Exercise) 2   Duration Progress to 45 minutes of aerobic exercise without signs/symptoms of physical distress   Intensity THRR unchanged   Progression   Progression Continue to progress workloads to maintain intensity without signs/symptoms of physical distress.   Resistance Training   Training Prescription Yes   Weight blue bands   Reps 10-12   Interval Training   Interval Training No   Arm Ergometer   Level 6   Minutes 15   Track   Laps 14   Minutes 15     Goals Met:  Exercise  tolerated well No report of cardiac concerns or symptoms Strength training completed today  Goals Unmet:  Not Applicable  Comments: Service time is from 1030 to 1200    Dr. Rush Farmer is Medical Director for Pulmonary Rehab at [facility].

## 2015-07-24 ENCOUNTER — Encounter (HOSPITAL_COMMUNITY)
Admission: RE | Admit: 2015-07-24 | Discharge: 2015-07-24 | Disposition: A | Discharge status: Home / Self Care | Payer: Medicare Other | Source: Ambulatory Visit | Attending: Pulmonary Disease | Admitting: Pulmonary Disease

## 2015-07-24 VITALS — Wt 173.7 lb

## 2015-07-24 DIAGNOSIS — J479 Bronchiectasis, uncomplicated: Secondary | ICD-10-CM

## 2015-07-24 DIAGNOSIS — J471 Bronchiectasis with (acute) exacerbation: Secondary | ICD-10-CM | POA: Diagnosis not present

## 2015-07-24 NOTE — Progress Notes (Signed)
Daily Session Note  Patient Details  Name: Brooke Jimenez MRN: [id number] Date of Birth: 1945-11-06 Referring Provider:    Encounter Date: 07/24/2015  Check In:     Session Check In - 07/24/15 1045    Check-In   [facility]   Staff Present Rosebud Poles, RN, BSN;Molly diVincenzo, MS, ACSM RCEP, Exercise Physiologist;Annedrea Rosezella [place], RN, MHA;Jaleya Pebley Rollene Rotunda, RN, BSN   Supervising physician immediately available to respond to emergencies [facility] immediately available   Physician(s) Dr. Marily Memos   Medication changes reported     No   Fall or balance concerns reported    No   Warm-up and Cool-down Performed as group-led instruction   Resistance Training Performed Yes   VAD Patient? No   Pain Assessment   Currently in Pain? No/denies   Multiple Pain Sites No      Capillary Blood Glucose: No results found for this or any previous visit (from the past 24 hour(s)).      Exercise Prescription Changes - 07/24/15 1212    Response to Exercise   Blood Pressure (Admit) 118/72 mmHg   Blood Pressure (Exercise) 140/68 mmHg   Blood Pressure (Exit) 120/70 mmHg   Heart Rate (Admit) 75 bpm   Heart Rate (Exercise) 95 bpm   Heart Rate (Exit) 83 bpm   Oxygen Saturation (Admit) 97 %   Oxygen Saturation (Exercise) 96 %   Oxygen Saturation (Exit) 94 %   Rating of Perceived Exertion (Exercise) 12   Perceived Dyspnea (Exercise) 2   Duration Progress to 45 minutes of aerobic exercise without signs/symptoms of physical distress   Intensity THRR unchanged   Progression   Progression Continue to progress workloads to maintain intensity without signs/symptoms of physical distress.   Resistance Training   Training Prescription Yes   Weight blue bands   Reps 10-12   Interval Training   Interval Training No   NuStep   Level 6   Minutes 15   METs 2.7   Arm Ergometer   Level 6   Minutes 15   Track   Laps 13   Minutes 15     Goals Met:  Independence  with exercise equipment Improved SOB with ADL's Using PLB without cueing & demonstrates good technique Exercise tolerated well No report of cardiac concerns or symptoms Strength training completed today  Goals Unmet:  Not Applicable  Comments: Service time is from 1030 to 1205   Dr. Rush Farmer is Medical Director for Pulmonary Rehab at [facility].

## 2015-07-26 ENCOUNTER — Encounter (HOSPITAL_COMMUNITY)
Admission: RE | Admit: 2015-07-26 | Discharge: 2015-07-26 | Disposition: A | Discharge status: Home / Self Care | Payer: Medicare Other | Source: Ambulatory Visit | Attending: Pulmonary Disease | Admitting: Pulmonary Disease

## 2015-07-26 DIAGNOSIS — J479 Bronchiectasis, uncomplicated: Secondary | ICD-10-CM

## 2015-07-26 DIAGNOSIS — J471 Bronchiectasis with (acute) exacerbation: Secondary | ICD-10-CM | POA: Diagnosis not present

## 2015-07-31 ENCOUNTER — Encounter (HOSPITAL_COMMUNITY): Payer: Medicare Other

## 2015-08-02 ENCOUNTER — Encounter (HOSPITAL_COMMUNITY): Payer: Medicare Other

## 2015-08-06 NOTE — Progress Notes (Signed)
Discharge Summary  Patient Details  Name: Brooke Jimenez MRN: [id number] Date of Birth: 1946-01-10 Referring Provider:     Number of Visits: 24  Reason for Discharge:  Patient reached a stable level of exercise. Patient independent in their exercise.  Smoking History:  History  Smoking status  . Never Smoker   Smokeless tobacco  . Never Used    Diagnosis:  Bronchiectasis without complication ([place])  ADL UCSD:     Pulmonary Assessment Scores      05/10/15 1443       ADL UCSD   SOB Score total 9        Initial Exercise Prescription:     Initial Exercise Prescription - 05/01/15 1600    Date of Initial Exercise RX and Referring Provider   Date 05/01/15   Oxygen   Oxygen --  Room Air   Bike   Level 0.9   Minutes 15   NuStep   Level 1   Minutes 15   METs 2   Track   Laps 8   Minutes 15   Prescription Details   Frequency (times per week) 2   Duration Progress to 45 minutes of aerobic exercise without signs/symptoms of physical distress   Intensity   THRR 40-80% of Max Heartrate 60-120   Ratings of Perceived Exertion 11-13   Perceived Dyspnea 0-4   Progression   Progression Continue to progress workloads to maintain intensity without signs/symptoms of physical distress.   Resistance Training   Training Prescription Yes   Weight Orange Bands   Reps 10-12      Discharge Exercise Prescription (Final Exercise Prescription Changes):     Exercise Prescription Changes - 07/24/15 1212    Response to Exercise   Blood Pressure (Admit) 118/72 mmHg   Blood Pressure (Exercise) 140/68 mmHg   Blood Pressure (Exit) 120/70 mmHg   Heart Rate (Admit) 75 bpm   Heart Rate (Exercise) 95 bpm   Heart Rate (Exit) 83 bpm   Oxygen Saturation (Admit) 97 %   Oxygen Saturation (Exercise) 96 %   Oxygen Saturation (Exit) 94 %   Rating of Perceived Exertion (Exercise) 12   Perceived Dyspnea (Exercise) 2   Duration Progress to 45 minutes of aerobic exercise without  signs/symptoms of physical distress   Intensity THRR unchanged   Progression   Progression Continue to progress workloads to maintain intensity without signs/symptoms of physical distress.   Resistance Training   Training Prescription Yes   Weight blue bands   Reps 10-12   Interval Training   Interval Training No   NuStep   Level 6   Minutes 15   METs 2.7   Arm Ergometer   Level 6   Minutes 15   Track   Laps 13   Minutes 15      Functional Capacity:     6 Minute Walk      05/01/15 1628 07/26/15 1604     6 Minute Walk   Phase Initial     Distance 1408 feet 1725 feet    Walk Time 6 minutes 6 minutes    # of Rest Breaks 0 0    MPH 2.7     METS 3.12     RPE 13 13    Perceived Dyspnea  2 3    VO2 Peak 11.1     Symptoms No     Resting HR 75 bpm 86 bpm    Resting BP 118/72 mmHg 110/70 mmHg    Max  Ex. HR 111 bpm 130 bpm    Max Ex. BP 144/86 mmHg 170/80 mmHg    2 Minute Post BP 114/80 mmHg 140/80 mmHg    Interval HR   Baseline HR 75     1 Minute HR 106     2 Minute HR 109     3 Minute HR 107     4 Minute HR 108     5 Minute HR 111     6 Minute HR 110     2 Minute Post HR 85     Interval Heart Rate? Yes     Interval Oxygen   Interval Oxygen? Yes     Baseline Oxygen Saturation % 98 %     Baseline Liters of Oxygen 0 L  Room Air     1 Minute Oxygen Saturation % 98 %     1 Minute Liters of Oxygen 0 L     2 Minute Oxygen Saturation % 98 %     2 Minute Liters of Oxygen 0 L     3 Minute Oxygen Saturation % 98 %     3 Minute Liters of Oxygen 0 L     4 Minute Oxygen Saturation % 98 %     4 Minute Liters of Oxygen 0 L     5 Minute Oxygen Saturation % 98 %     5 Minute Liters of Oxygen 0 L     6 Minute Oxygen Saturation % 98 %     6 Minute Liters of Oxygen 0 L     2 Minute Post Oxygen Saturation % 100 %     2 Minute Post Liters of Oxygen 0 L     Pre/Post BP   Baseline BP 118/72 mmHg     6 Minute BP 144/86 mmHg     Pre/Post BP? Yes        Psychological, QOL,  Others - Outcomes: PHQ 2/9: Depression screen PHQ 2/9 04/27/2015  Decreased Interest 0  Down, Depressed, Hopeless 0  PHQ - 2 Score 0    Quality of Life:     Quality of Life - 05/10/15 1442    Quality of Life Scores   Health/Function Pre 24.86 %   Socioeconomic Pre 24 %   Psych/Spiritual Pre 25.2 %   Family Pre 23.63 %   GLOBAL Pre 24.55 %      Personal Goals: Goals established at orientation with interventions provided to work toward goal.     Personal Goals and Risk Factors at Admission - 05/22/15 1423    Core Components/Risk Factors/Patient Goals on Admission   Increase Strength and Stamina Yes   Intervention Provide advice, education, support and counseling about physical activity/exercise needs.;Develop an individualized exercise prescription for aerobic and resistive training based on initial evaluation findings, risk stratification, comorbidities and participant's personal goals.   Expected Outcomes Achievement of increased cardiorespiratory fitness and enhanced flexibility, muscular endurance and strength shown through measurements of functional capacity and personal statement of participant.       Personal Goals Discharge:     Goals and Risk Factor Review      05/22/15 1428 06/14/15 0758 06/14/15 0759 07/12/15 0840 07/31/15 1510   Core Components/Risk Factors/Patient Goals Review   Personal Goals Review Weight Management/Obesity;Sedentary;Increase Strength and Stamina;Improve shortness of breath with ADL's;Other;Stress;Develop more efficient breathing techniques such as purse lipped breathing and diaphragmatic breathing and practicing self-pacing with activity. Improve shortness of breath with ADL's;Increase Strength and Stamina;Sedentary;Other Weight Management/Obesity;Stress  Sedentary;Develop more efficient breathing techniques such as purse lipped breathing and diaphragmatic breathing and practicing self-pacing with activity.;Improve shortness of breath with  ADL's;Increase Strength and Stamina Weight Management/Obesity   Review see "comments" sections on ITP  see "comments" sections on ITP see "comments" sections on ITP Pt wt stable during Pulmonary Rehab. Wt loss goal not met   Expected Outcomes see Admission expected outcomes  see Admission expected outcomes see Admission expected outcomes see Admission expected outcomes     08/02/15 0851           Core Components/Risk Factors/Patient Goals Review   Personal Goals Review Sedentary;Increase Strength and Stamina;Improve shortness of breath with ADL's;Other;Develop more efficient breathing techniques such as purse lipped breathing and diaphragmatic breathing and practicing self-pacing with activity.       Review patient has met all of goals identified       Expected Outcomes see Admission expected outcomes          Nutrition & Weight - Outcomes:     Pre Biometrics - 04/27/15 1610    Pre Biometrics   Grip Strength --       Nutrition:     Nutrition Therapy & Goals - 05/22/15 1525    Personal Nutrition Goals   Personal Goal #1 Wt loss of 6-12 lb at graduation from Pulmonary Rehab. Pt goal wt is 161-166 lb.      Nutrition Discharge:     Nutrition Assessments - 07/31/15 1507    Rate Your Plate Scores   Pre Score 63   Post Score --  post-survey not returned      Education Questionnaire Score:     Knowledge Questionnaire Score - 05/10/15 1442    Knowledge Questionnaire Score   Pre Score 10/13      Patient met all of her personal goals. She did not return post program homework which includes quality of life survey, UCSD, and Education Questionnaire.

## 2015-08-07 ENCOUNTER — Encounter (HOSPITAL_COMMUNITY): Payer: Medicare Other

## 2015-08-09 ENCOUNTER — Encounter (HOSPITAL_COMMUNITY): Payer: Medicare Other

## 2015-08-14 ENCOUNTER — Encounter: Payer: Self-pay | Admitting: Allergy and Immunology

## 2015-08-14 ENCOUNTER — Encounter (HOSPITAL_COMMUNITY): Payer: Medicare Other

## 2015-08-14 ENCOUNTER — Institutional Professional Consult (permissible substitution): Payer: Medicare Other | Admitting: Internal Medicine

## 2015-08-14 ENCOUNTER — Ambulatory Visit (INDEPENDENT_AMBULATORY_CARE_PROVIDER_SITE_OTHER): Payer: Medicare Other | Admitting: Allergy and Immunology

## 2015-08-14 VITALS — BP 130/70 | HR 83 | Temp 98.3°F | Resp 16 | Ht 63.5 in | Wt 171.0 lb

## 2015-08-14 DIAGNOSIS — J479 Bronchiectasis, uncomplicated: Secondary | ICD-10-CM | POA: Diagnosis not present

## 2015-08-14 DIAGNOSIS — J40 Bronchitis, not specified as acute or chronic: Secondary | ICD-10-CM | POA: Diagnosis not present

## 2015-08-14 DIAGNOSIS — J310 Chronic rhinitis: Secondary | ICD-10-CM

## 2015-08-14 DIAGNOSIS — J329 Chronic sinusitis, unspecified: Secondary | ICD-10-CM | POA: Diagnosis not present

## 2015-08-14 MED ORDER — FLUTICASONE PROPIONATE 50 MCG/ACT NA SUSP: NASAL | Status: DC

## 2015-08-14 NOTE — Patient Instructions (Addendum)
Bronchiectasis ([place]) We will rule out alpha-1 antitrypsin deficiency and immunodeficiency with labs.  The following labs have been ordered: Alpha-1 antitrypsin levels, IgG, IgA, IgM, as well as tetanus IgG and pneumococcal IgG titers. Post-vaccination titers will be drawn to assess response if IgG and pre-vaccination titers are low.    The patient will be called with further recommendations and follow-up instructions once the labs have returned.  Chronic rhinitis Non-allergic rhinitis.  All seasonal and perennial aeroallergen skin tests are negative despite a positive histamine control.  Intranasal steroids and intranasal antihistamines are effective for symptoms associated with non-allergic rhinitis, whereas second generation antihistamines such as cetirizine, loratadine and fexofenadine have been found to be ineffective for this condition.  A prescription has been provided for fluticasone nasal spray, one spray per nostril 1-2 times daily as needed. Proper nasal spray technique has been discussed and demonstrated.  I have also recommended nasal saline spray (i.e., Simply Saline) or nasal saline lavage (i.e., NeilMed) as needed prior to medicated nasal sprays.    When lab results have returned the patient will be called with further recommendations and follow up structure and's.

## 2015-08-14 NOTE — Progress Notes (Addendum)
New Patient Note  RE: Brooke Jimenez MRN: [id number] DOB: 02/05/1946 Date of Office Visit: 08/14/2015  Referring provider: Karleen Hampshire., MD Primary care provider: Adline Mango, MD  Chief Complaint: Breathing Problem; Nasal Congestion; and Sinus Problem   History of present illness: HPI Comments: Brooke Jimenez is a 70 y.o. female presenting today for her initial consultation.  She reports that she was diagnosed with bronchiectasis in February 2017.  She experiences dyspnea with mild/moderate exertion.  She is under the care of a pulmonologist at [facility] and is followed by a respiratory therapist.  She is born premature and had recurrent bronchitis and croup throughout childhood.  She was diagnosed with COPD at one point, though she has never been a cigarette smoker.  However, she was exposed to secondhand cigarette smoke in her household growing up as well as during her marriage.  She was diagnosed with pneumonia in 2004, has had multiple sinus infections and urinary tract infections in the past.  She frequently experiences nasal congestion, rhinorrhea, postnasal drainage, irritated throat, and occasional sinus pressure over the forehead and cheekbones. No significant seasonal symptom variation has been noted nor have specific environmental triggers been identified. She is curious if there is anything, from an allergy standpoint, that can slow or halt the progression of the bronchiectasis.   Assessment and plan: Bronchiectasis ([place]) We will rule out alpha-1 antitrypsin deficiency and immunodeficiency with labs.  The following labs have been ordered: Alpha-1 antitrypsin levels, IgG, IgA, IgM, as well as tetanus IgG and pneumococcal IgG titers. Post-vaccination titers will be drawn to assess response if IgG and pre-vaccination titers are low.    The patient will be called with further recommendations and follow-up instructions once the labs have returned.  Chronic rhinitis Non-allergic  rhinitis.  All seasonal and perennial aeroallergen skin tests are negative despite a positive histamine control.  Intranasal steroids and intranasal antihistamines are effective for symptoms associated with non-allergic rhinitis, whereas second generation antihistamines such as cetirizine, loratadine and fexofenadine have been found to be ineffective for this condition.  A prescription has been provided for fluticasone nasal spray, one spray per nostril 1-2 times daily as needed. Proper nasal spray technique has been discussed and demonstrated.  I have also recommended nasal saline spray (i.e., Simply Saline) or nasal saline lavage (i.e., NeilMed) as needed prior to medicated nasal sprays.    Meds ordered this encounter  Medications  . fluticasone (FLONASE) 50 MCG/ACT nasal spray    Sig: USE ONE SPRAY IN EACH NOSTRIL ONCE OR TWICE DAILY    Dispense:  16 g    Refill:  5    Diagnositics: Spirometry: FVC was 1.37 L (48% predicted) and FEV1 was 1.17 L (53% predicted).  Severe restriction.   Please see scanned spirometry results for details. Epicutaneous testing: Negative despite a positive histamine control. Intradermal testing: Negative despite a positive histamine control.    Physical examination: Blood pressure 130/70, pulse 83, temperature 98.3 F (36.8 C), temperature source Oral, resp. rate 16, height 5' 3.5" (1.613 m), weight 171 lb (77.565 kg), SpO2 98 %.  General: Alert, interactive, in no acute distress. HEENT: TMs pearly gray, turbinates moderately edematous without discharge, post-pharynx erythematous. Neck: Supple without lymphadenopathy. Lungs: Mildly decreased breath sounds bilaterally without wheezing, rhonchi or rales. CV: Normal S1, S2 without murmurs. Abdomen: Nondistended, nontender. Skin: Warm and dry, without lesions or rashes. Extremities:  No clubbing, cyanosis or edema. Neuro:   Grossly intact.  Review of systems:  Review of Systems  Constitutional: Negative  for fever, chills and weight loss.  HENT: Positive for congestion. Negative for nosebleeds.   Eyes: Negative for blurred vision.  Respiratory: Positive for cough and shortness of breath. Negative for hemoptysis.   Cardiovascular: Negative for chest pain.  Gastrointestinal: Negative for diarrhea and constipation.  Genitourinary: Negative for dysuria.  Musculoskeletal: Negative for myalgias and joint pain.  Skin: Negative for itching and rash.  Neurological: Positive for headaches. Negative for dizziness.  Endo/Heme/Allergies: Does not bruise/bleed easily.    Past medical history:  Past Medical History  Diagnosis Date  . Mitral valve prolapse   . Hypothyroidism   . Anxiety   . H/O hiatal hernia   . History of melanoma     melanoma- 1986 and 1997   . PTSD (post-traumatic stress disorder) 2006  . Corneal dystrophy   . Dysphagia     chronic- please see ultrasound done 4/16 15 in [facility]   . Hearing loss     bilateral due to nerve loss. wears hearing aides  . Family history of adverse reaction to anesthesia     mother slow to wake up   . Dysrhythmia     sometimes patient has extra beats per patient   . Heart murmur     no problem   . Nocturia   . Arthritis     arthritis in hip   . Peripheral neuropathy (HCC)     feet  . GERD (gastroesophageal reflux disease)     under control  . Esophageal dysmotility   . Low back pain with sciatica   . DDD (degenerative disc disease), cervical     and lower back  . Glaucoma     "suspect"  . Cataract     beginning  . Varicose veins   . Spider veins   . Diverticulosis   . Osteoporosis   . Urinary incontinence     occasional  . Gout     one finger x1 attack  . Measles     hx of  . History of chicken pox   . Acne rosacea   . Complication of anesthesia     "really sore throat"  . PONV (postoperative nausea and vomiting)   . RSD (reflex sympathetic dystrophy) 1993  . Bronchiectasis [facility])     Past surgical history:  Past Surgical  History  Procedure Laterality Date  . Melanoma surgery   1986, 1997  . Hardware removal Left 10/17/2013    Procedure: HARDWARE REMOVAL LEFT HIP;  Surgeon: Gearlean Alf, MD;  Location: [facility];  Service: Orthopedics;  Laterality: Left;  . Umbilical hernia repair N/A 02/08/2014    Procedure: HERNIA REPAIR UMBILICAL ADULT WITH MESH;  Surgeon: Jackolyn Confer, MD;  Location: [facility];  Service: General;  Laterality: N/A;  . Insertion of mesh  02/08/2014    Procedure: INSERTION OF MESH;  Surgeon: Jackolyn Confer, MD;  Location: [facility];  Service: General;;  . Hip fracture surgery Left 2006    3 screws  . Total hip arthroplasty Left 04/10/2014    Procedure: LEFT TOTAL HIP ARTHROPLASTY ANTERIOR APPROACH;  Surgeon: Gearlean Alf, MD;  Location: [facility];  Service: Orthopedics;  Laterality: Left;  . Parathyroidectomy  04/02/15    Family history: Family History  Problem Relation Age of Onset  . Cancer Mother   . Hypothyroidism Mother   . Congestive Heart Failure Mother   . Hypertension Mother   . Heart failure Father     Social history: Social  History   Social History  . Marital Status: Married    Spouse Name: N/A  . Number of Children: N/A  . Years of Education: N/A   Occupational History  . Not on file.   Social History Main Topics  . Smoking status: Never Smoker   . Smokeless tobacco: Never Used  . Alcohol Use: No  . Drug Use: No  . Sexual Activity: Not on file   Other Topics Concern  . Not on file   Social History Narrative   Environmental History: The patient lives in a house with hardwood floors throughout and carpeting in the bedroom, central air/heat.  There are 2 cats in the house which have access to her bedroom.  She is a nonsmoker but has been exposed to significant secondhand cigarette smoke throughout her lifetime.    Medication List       This list is accurate as of: 08/14/15  6:46 PM.  Always use your most recent med list.               acetaminophen 500  MG tablet  Commonly known as:  TYLENOL  Take 500 mg by mouth every 6 (six) hours as needed for mild pain.     calcium carbonate 500 MG chewable tablet  Commonly known as:  TUMS - dosed in mg elemental calcium  Chew 3 tablets by mouth daily.     CALCIUM PO  Take by mouth.     clindamycin 150 MG capsule  Commonly known as:  CLEOCIN  Take 600 mg by mouth. One hour before dental appointments     fluticasone 50 MCG/ACT nasal spray  Commonly known as:  FLONASE  USE ONE SPRAY IN EACH NOSTRIL ONCE OR TWICE DAILY     ibuprofen 200 MG tablet  Commonly known as:  ADVIL,MOTRIN  Take 200 mg by mouth 2 (two) times daily as needed for headache or moderate pain.     levothyroxine 50 MCG tablet  Commonly known as:  SYNTHROID, LEVOTHROID  Take 50 mcg by mouth daily before breakfast.     Lutein 20 MG Tabs  Take 1 tablet by mouth daily.     Magnesium 250 MG Tabs  Take 250 mg by mouth daily.     multivitamin with minerals Tabs tablet  Take 1 tablet by mouth daily.     VITAMIN D-3 PO  Take by mouth.     VITAMIN-B COMPLEX PO  Take 1 tablet by mouth daily.        Known medication allergies: Allergies  Allergen Reactions  . Codeine Nausea And Vomiting    DILAUDID AND TRAMADOL ARE OKAY.   Marland Kitchen Doxycycline Nausea And Vomiting  . Epinephrine     Heart pounds very hard  . Erythromycin Other (See Comments)    Abdominal pain   . Guaifenesin Nausea Only  . Levaquin [Levofloxacin In D5w] Nausea And Vomiting  . Neosporin  [Neomycin-Bacitracin Zn-Polymyx]     Other reaction(s): Other (See Comments) Blisters, Erythema  . Adhesive [Tape] Rash  . Nickel Rash  . Nsaids Nausea And Vomiting  . Penicillins Rash  . Sulfa Antibiotics Rash    I appreciate the opportunity to take part in Brooke Jimenez's care. Please do not hesitate to contact me with questions.  Sincerely,   R. Edgar Frisk, MD

## 2015-08-14 NOTE — Assessment & Plan Note (Signed)
Non-allergic rhinitis.  All seasonal and perennial aeroallergen skin tests are negative despite a positive histamine control.  Intranasal steroids and intranasal antihistamines are effective for symptoms associated with non-allergic rhinitis, whereas second generation antihistamines such as cetirizine, loratadine and fexofenadine have been found to be ineffective for this condition.  A prescription has been provided for fluticasone nasal spray, one spray per nostril 1-2 times daily as needed. Proper nasal spray technique has been discussed and demonstrated.  I have also recommended nasal saline spray (i.e., Simply Saline) or nasal saline lavage (i.e., NeilMed) as needed prior to medicated nasal sprays.

## 2015-08-14 NOTE — Assessment & Plan Note (Addendum)
We will rule out alpha-1 antitrypsin deficiency and immunodeficiency with labs.  The following labs have been ordered: Alpha-1 antitrypsin levels, IgG, IgA, IgM, as well as tetanus IgG and pneumococcal IgG titers. Post-vaccination titers will be drawn to assess response if IgG and pre-vaccination titers are low.    The patient will be called with further recommendations and follow-up instructions once the labs have returned.

## 2015-08-15 ENCOUNTER — Telehealth: Payer: Self-pay | Admitting: Allergy and Immunology

## 2015-08-15 NOTE — Telephone Encounter (Signed)
Pt called and wants a nurse to call her she seen Dr.Bobbitt yesterday and he is sending her for labs  And she wanted to know if the test could have any thing to do with  Her been on doxycycline and finish on June 10. She is going for IgG,IgA,IgM,IgE. Tetanus antibody IgG, Pneumococcal IM at [facility]. [phone]. Also need to let you know that she had place to come up on her arm,.

## 2015-08-16 ENCOUNTER — Encounter (HOSPITAL_COMMUNITY): Payer: Medicare Other

## 2015-08-16 NOTE — Telephone Encounter (Signed)
Pt is calling to get an update from her message on yesterday. Pt is very concerned.  Please Advise

## 2015-08-16 NOTE — Telephone Encounter (Signed)
Informed patient that the delayed reaction on the arm is not significant and she is not allergic to these things, that she may apply some Hydrocortisone cream and take an antihistamine for the itching. Patient states that she didn't finish the Doxycycline until June 12 and she had read that it could lower the results to the blood test that Dr. Verlin Fester requested. I informed the patient that if she would like that she could wait an additional week before having the blood work drawn. Patient agreed and will have blood test drawn next week.

## 2015-08-21 ENCOUNTER — Encounter (HOSPITAL_COMMUNITY): Payer: Medicare Other

## 2015-08-23 ENCOUNTER — Encounter (HOSPITAL_COMMUNITY): Payer: Medicare Other

## 2015-08-28 ENCOUNTER — Encounter (HOSPITAL_COMMUNITY): Payer: Medicare Other

## 2015-08-30 ENCOUNTER — Encounter: Payer: Self-pay | Admitting: Pulmonary Disease

## 2015-08-30 ENCOUNTER — Encounter (INDEPENDENT_AMBULATORY_CARE_PROVIDER_SITE_OTHER): Payer: Self-pay

## 2015-08-30 ENCOUNTER — Ambulatory Visit (INDEPENDENT_AMBULATORY_CARE_PROVIDER_SITE_OTHER): Payer: Medicare Other | Admitting: Pulmonary Disease

## 2015-08-30 VITALS — BP 126/78 | HR 94 | Ht 63.5 in | Wt 172.0 lb

## 2015-08-30 DIAGNOSIS — J479 Bronchiectasis, uncomplicated: Secondary | ICD-10-CM | POA: Diagnosis not present

## 2015-08-30 DIAGNOSIS — K224 Dyskinesia of esophagus: Secondary | ICD-10-CM | POA: Diagnosis not present

## 2015-08-30 LAB — IGG, IGA, IGM
IgA/Immunoglobulin A, Serum: 152 mg/dL (ref 87–352)
IgG (Immunoglobin G), Serum: 863 mg/dL (ref 700–1600)
IgM (Immunoglobulin M), Srm: 16 mg/dL — ABNORMAL LOW (ref 26–217)

## 2015-08-30 LAB — PNEUMOCOCCAL IM (14 SEROTYPE)
Pneumo Ab Type 1*: <0.3 ug/mL — ABNORMAL LOW (ref >1.3)
Pneumo Ab Type 12 (12F)*: <0.3 ug/mL — ABNORMAL LOW (ref >1.3)
Pneumo Ab Type 14*: 2.6 ug/mL (ref >1.3)
Pneumo Ab Type 19 (19F)*: >34.9 ug/mL (ref >1.3)
Pneumo Ab Type 23 (23F)*: 2.2 ug/mL (ref >1.3)
Pneumo Ab Type 26 (6B)*: 0.7 ug/mL — ABNORMAL LOW (ref >1.3)
Pneumo Ab Type 3*: 2.4 ug/mL (ref >1.3)
Pneumo Ab Type 4*: 1.1 ug/mL — ABNORMAL LOW (ref >1.3)
Pneumo Ab Type 51 (7F)*: 2.8 ug/mL (ref >1.3)
Pneumo Ab Type 56 (18C)*: 2 ug/mL (ref >1.3)
Pneumo Ab Type 57 (19A)*: >23.2 ug/mL (ref >1.3)
Pneumo Ab Type 68 (9V)*: 0.7 ug/mL — ABNORMAL LOW (ref >1.3)
Pneumo Ab Type 8*: <0.3 ug/mL — ABNORMAL LOW (ref >1.3)
Pneumo Ab Type 9 (9N)*: 0.3 ug/mL — ABNORMAL LOW (ref >1.3)

## 2015-08-30 LAB — IGE: IgE (Immunoglobulin E), Serum: 5 IU/mL (ref 0–100)

## 2015-08-30 LAB — TETANUS ANTIBODY, IGG: Tetanus Ab, IgG: 4.28 IU/mL (ref <0.10)

## 2015-08-30 LAB — ALPHA-1 ANTITRYPSIN PHENOTYPE: A-1 Antitrypsin: 132 mg/dL (ref 90–200)

## 2015-08-30 NOTE — Progress Notes (Signed)
Subjective:    Patient ID: Brooke Jimenez, female    DOB: [birth year], 70 y.o.   MRN: [id number]  HPI Chief Complaint  Patient presents with  . Advice Only    Pt was dx'ed with bronchiectasis in February 2017, has been going to pulm rehab for this.  Pt would like to establish with pulmonologist for this problem.      Brooke Jimenez has been referred to me for evaluation of bronchiectasis.  She knows that she has bronchiectasis since February 2017 when she was being evaluated for a parathyroidectomy.  She tells me that in August 2015 she had some chest X-rays around the time of some orthopedic surgery and an umbilical hernia repair. Those apparently were read as normal.  In the Spring of 2016 around the time of hip surgery she noticed dyspnea with physical therapy.  This persisted over the year and she talked to her PCP who evaluated with a 6 minute walk and a pulmonary referral at [facility].  She had PFTs and a CXR which showed "severe scarring".  She was diagnosed with COPD and was prescribed some inhalers.  Around this time she was diagnosed with hyperparathyroidism.  Anesthesiology felt she needed pulmonary evaluation and she had PFTs and was cleared for surgery.  She ended up having a CT scan that showed (she thinks) bronchiectasis.    She was born prematurely and had bronchitis "croup" when she was a child.  She had frequently had bronchitis but no pneumonia.  No tuberculus.  Her uncle had emphysema, but none with bronchiectasis.  She never smoked, but her father smoked around her as a child.  She had pneumonia in 2004 and was treated as an outpatient.  She has had bronchitis but she doesn't recall it being a recurrent or severe problem.    She has worked in Neurosurgeon and counseling.  Never worked directly with chemicals or toxic fumes that she is aware of.  She moved to the [place] of [place] in [place] which she says has a lot of air pollution.  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  She recalls cleaning a bathroom once with scrubbing bubbles and feeling worse with this, but not particularly with a breathing problem.  She has had some dust exposure associated with wallpaper and tile changes in her home.    She has had problems with esophageal dysmotility over the years and GERD. She has had an upper endoscopy in the past and was told that her biopsies were normal.  She was told she had a hiatal hernia.  She recalls a period in 2015 when she had a lot of choking "strangling" and she had a barium swallow evaluation 05/2013 (esophogram) that showed silent aspiration.    Past Medical History  Diagnosis Date  . Mitral valve prolapse   . Hypothyroidism   . Anxiety   . H/O hiatal hernia   . History of melanoma     melanoma- 1986 and 1997   . PTSD (post-traumatic stress disorder) 2006  . Corneal dystrophy   . Dysphagia     chronic- please see ultrasound done 4/16 15 in [facility]   . Hearing loss     bilateral due to nerve loss. wears hearing aides  . Family history of adverse reaction to anesthesia     mother slow to wake up   . Dysrhythmia     sometimes patient has extra beats per patient   . Heart murmur  no problem   . Nocturia   . Arthritis     arthritis in hip   . Peripheral neuropathy (HCC)     feet  . GERD (gastroesophageal reflux disease)     under control  . Esophageal dysmotility   . Low back pain with sciatica   . DDD (degenerative disc disease), cervical     and lower back  . Glaucoma     "suspect"  . Cataract     beginning  . Varicose veins   . Spider veins   . Diverticulosis   . Osteoporosis   . Urinary incontinence     occasional  . Gout     one finger x1 attack  . Measles     hx of  . History of chicken pox   . Acne rosacea   . Complication of anesthesia     "really sore throat"  . PONV (postoperative nausea and vomiting)   . RSD (reflex sympathetic dystrophy) 1993  . Bronchiectasis ([place])      Family History    Problem Relation Age of Onset  . Cancer Mother   . Hypothyroidism Mother   . Congestive Heart Failure Mother   . Hypertension Mother   . Heart failure Father      Social History   Social History  . Marital Status: Married    Spouse Name: N/A  . Number of Children: N/A  . Years of Education: N/A   Occupational History  . Not on file.   Social History Main Topics  . Smoking status: Never Smoker   . Smokeless tobacco: Never Used  . Alcohol Use: No  . Drug Use: No  . Sexual Activity: Not on file   Other Topics Concern  . Not on file   Social History Narrative     Allergies  Allergen Reactions  . Codeine Nausea And Vomiting    DILAUDID AND TRAMADOL ARE OKAY.   Marland Kitchen Epinephrine     Heart pounds very hard  . Erythromycin Other (See Comments)    Abdominal pain   . Guaifenesin Nausea Only  . Levaquin [Levofloxacin In D5w] Nausea And Vomiting  . Minocycline     Skin discoloration on ankles per pt  . Neosporin  [Neomycin-Bacitracin Zn-Polymyx]     Other reaction(s): Other (See Comments) Blisters, Erythema  . Adhesive [Tape] Rash  . Nickel Rash  . Nsaids Nausea And Vomiting  . Penicillins Rash  . Sulfa Antibiotics Rash     Outpatient Prescriptions Prior to Visit  Medication Sig Dispense Refill  . acetaminophen (TYLENOL) 500 MG tablet Take 500 mg by mouth every 6 (six) hours as needed for mild pain.    . B Complex Vitamins (VITAMIN-B COMPLEX PO) Take 1 tablet by mouth daily.    . calcium carbonate (TUMS - DOSED IN MG ELEMENTAL CALCIUM) 500 MG chewable tablet Chew 3 tablets by mouth daily as needed.     Marland Kitchen CALCIUM PO Take 1 tablet by mouth daily.     . Cholecalciferol (VITAMIN D-3 PO) Take by mouth.    . clindamycin (CLEOCIN) 150 MG capsule Take 600 mg by mouth. One hour before dental appointments    . ibuprofen (ADVIL,MOTRIN) 200 MG tablet Take 200 mg by mouth 2 (two) times daily as needed for headache or moderate pain.    Marland Kitchen levothyroxine (SYNTHROID, LEVOTHROID) 50 MCG  tablet Take 50 mcg by mouth daily before breakfast.    . Lutein 20 MG TABS Take 1 tablet by mouth  daily.    . Magnesium 250 MG TABS Take 250 mg by mouth daily.     . Multiple Vitamin (MULTIVITAMIN WITH MINERALS) TABS tablet Take 1 tablet by mouth daily.    . fluticasone (FLONASE) 50 MCG/ACT nasal spray USE ONE SPRAY IN EACH NOSTRIL ONCE OR TWICE DAILY (Patient not taking: Reported on 08/30/2015) 16 g 5   No facility-administered medications prior to visit.       Review of Systems  Constitutional: Negative for fever and unexpected weight change.  HENT: Positive for congestion. Negative for dental problem, ear pain, nosebleeds, postnasal drip, rhinorrhea, sinus pressure, sneezing, sore throat and trouble swallowing.   Eyes: Negative for redness and itching.  Respiratory: Positive for cough and shortness of breath. Negative for chest tightness and wheezing.   Cardiovascular: Negative for palpitations and leg swelling.  Gastrointestinal: Negative for nausea and vomiting.  Genitourinary: Negative for dysuria.  Musculoskeletal: Negative for joint swelling.  Skin: Negative for rash.  Neurological: Negative for headaches.  Hematological: Does not bruise/bleed easily.  Psychiatric/Behavioral: Negative for dysphoric mood. The patient is not nervous/anxious.        Objective:   Physical Exam Filed Vitals:   08/30/15 1655  BP: 126/78  Pulse: 94  Height: 5' 3.5" (1.613 m)  Weight: 172 lb (78.019 kg)  SpO2: 96%    Gen: well appearing, no acute distress HENT: NCAT, OP clear, neck supple without masses Eyes: PERRL, EOMi Lymph: no cervical lymphadenopathy PULM: Crackles bases bilaterally, no wheezing, good air movement CV: RRR, no mgr, no JVD GI: BS+, soft, nontender, no hsm Derm: no rash or skin breakdown MSK: normal bulk and tone Neuro: A&Ox4, CN II-XII intact, strength 5/5 in all 4 extremities Psyche: normal mood and affect       Assessment & Plan:  Bronchiectasis without acute  exacerbation (HCC) A CT scan at [facility] showed evidence of mild bronchiectasis in the bases of her lungs with a patulous esophagus. She had moderate airflow obstruction on pulmonary function testing at that time. Fortunately, despite having bronchiectasis this doesn't seem to have caused much problems that she reports no significant chest congestion or mucus production and no recurrent episodes of bronchitis or pneumonia in the last few years. However, she has had bronchitis and pneumonia throughout her life but it doesn't seem to have been a recurrent problem recently.  In general, she has mild bronchiectasis.  As to the cause, I think the most likely cause is recurrent and silent aspiration as this was witnessed on a barium swallow test in May 2014. She's had a negative speech therapy evaluation at [facility].  I reviewed the records from her recent evaluation by allergy and immunology where she had no significant evidence of an underlying immunodeficiency with the exception of a mildly low IgM. She should follow-up with immunology regarding this. Her alpha-1 test was negative that time. Because of a lack of family history of bronchiectasis the likelihood of cystic fibrosis is very low.  Plan: I explained to her today that I think the primary focus from today's visit should be prevention of any sort of worsening of this. As I believe that this is due to her recurrent aspiration in the setting of what sounds like pharyngeal dysphagia and esophageal dysmotility I recommend that she see a speech therapist and an esophagus specialist. We will make consultation request for both. She can follow-up with me on an as-needed basis.  Greater than 50 minutes were spent in direct consultation  with this patient and over 60 minute visit today.     Current outpatient prescriptions:  .  acetaminophen (TYLENOL) 500 MG tablet, Take 500 mg by mouth every 6 (six) hours as needed for mild  pain., Disp: , Rfl:  .  B Complex Vitamins (VITAMIN-B COMPLEX PO), Take 1 tablet by mouth daily., Disp: , Rfl:  .  calcium carbonate (TUMS - DOSED IN MG ELEMENTAL CALCIUM) 500 MG chewable tablet, Chew 3 tablets by mouth daily as needed. , Disp: , Rfl:  .  CALCIUM PO, Take 1 tablet by mouth daily. , Disp: , Rfl:  .  Cholecalciferol (VITAMIN D-3 PO), Take by mouth., Disp: , Rfl:  .  clindamycin (CLEOCIN) 150 MG capsule, Take 600 mg by mouth. One hour before dental appointments, Disp: , Rfl:  .  ibuprofen (ADVIL,MOTRIN) 200 MG tablet, Take 200 mg by mouth 2 (two) times daily as needed for headache or moderate pain., Disp: , Rfl:  .  levothyroxine (SYNTHROID, LEVOTHROID) 50 MCG tablet, Take 50 mcg by mouth daily before breakfast., Disp: , Rfl:  .  Lutein 20 MG TABS, Take 1 tablet by mouth daily., Disp: , Rfl:  .  Magnesium 250 MG TABS, Take 250 mg by mouth daily. , Disp: , Rfl:  .  Multiple Vitamin (MULTIVITAMIN WITH MINERALS) TABS tablet, Take 1 tablet by mouth daily., Disp: , Rfl:  .  sodium chloride (MURO 128) 2 % ophthalmic solution, Place 1 drop into both eyes at bedtime., Disp: , Rfl:

## 2015-08-30 NOTE — Assessment & Plan Note (Addendum)
A CT scan at [facility] showed evidence of mild bronchiectasis in the bases of her lungs with a patulous esophagus. She had moderate airflow obstruction on pulmonary function testing at that time. Fortunately, despite having bronchiectasis this doesn't seem to have caused much problems that she reports no significant chest congestion or mucus production and no recurrent episodes of bronchitis or pneumonia in the last few years. However, she has had bronchitis and pneumonia throughout her life but it doesn't seem to have been a recurrent problem recently.  In general, she has mild bronchiectasis.  As to the cause, I think the most likely cause is recurrent and silent aspiration as this was witnessed on a barium swallow test in May 2014. She's had a negative speech therapy evaluation at [facility].  I reviewed the records from her recent evaluation by allergy and immunology where she had no significant evidence of an underlying immunodeficiency with the exception of a mildly low IgM. She should follow-up with immunology regarding this. Her alpha-1 test was negative that time. Because of a lack of family history of bronchiectasis the likelihood of cystic fibrosis is very low.  Plan: I explained to her today that I think the primary focus from today's visit should be prevention of any sort of worsening of this. As I believe that this is due to her recurrent aspiration in the setting of what sounds like pharyngeal dysphagia and esophageal dysmotility I recommend that she see a speech therapist and an esophagus specialist. We will make consultation request for both. She can follow-up with me on an as-needed basis.  Greater than 50 minutes were spent in direct consultation with this patient and over 60 minute visit today.

## 2015-08-30 NOTE — Patient Instructions (Signed)
We will refer you to gastroenterology for esophageal dysmotility We will refer you to speech therapy for modified barium swallow test Follow-up with [place] if you develop respiratory symptoms

## 2015-08-31 ENCOUNTER — Other Ambulatory Visit (HOSPITAL_COMMUNITY): Payer: Self-pay | Admitting: Pulmonary Disease

## 2015-08-31 DIAGNOSIS — R1310 Dysphagia, unspecified: Secondary | ICD-10-CM

## 2015-09-03 ENCOUNTER — Telehealth: Payer: Self-pay | Admitting: *Deleted

## 2015-09-03 NOTE — Telephone Encounter (Signed)
Left message for patient to call office.  

## 2015-09-03 NOTE — Telephone Encounter (Signed)
-----   Message from Adelina Mings, MD sent at 09/03/2015  1:45 PM EDT ----- If the patient has not received Pneumovax in the past 2 years, have her receive Pneumovax from her primary care physician and then recheck pneumococcal titers 6 weeks later.  Thanks.

## 2015-09-03 NOTE — Telephone Encounter (Signed)
Patient has had vaccine within last 2 years Dr Verlin Fester please advise

## 2015-09-03 NOTE — Telephone Encounter (Signed)
No need to repeat the Pneumovax.  Alpha-1 antitrypsin was normal and IgG level is normal.  This does not appear to be an immunodeficiency.

## 2015-09-04 NOTE — Telephone Encounter (Signed)
Patient informed. 

## 2015-09-05 ENCOUNTER — Ambulatory Visit (HOSPITAL_COMMUNITY)
Admission: RE | Admit: 2015-09-05 | Discharge: 2015-09-05 | Disposition: A | Discharge status: Home / Self Care | Payer: Medicare Other | Source: Ambulatory Visit | Attending: Pulmonary Disease | Admitting: Pulmonary Disease

## 2015-09-05 DIAGNOSIS — J479 Bronchiectasis, uncomplicated: Secondary | ICD-10-CM

## 2015-09-05 DIAGNOSIS — K224 Dyskinesia of esophagus: Secondary | ICD-10-CM | POA: Insufficient documentation

## 2015-09-05 DIAGNOSIS — R1310 Dysphagia, unspecified: Secondary | ICD-10-CM

## 2015-09-07 ENCOUNTER — Other Ambulatory Visit: Payer: Self-pay

## 2015-09-07 DIAGNOSIS — R1310 Dysphagia, unspecified: Secondary | ICD-10-CM

## 2015-09-07 NOTE — Progress Notes (Signed)
Brooke Jimenez

## 2015-09-12 ENCOUNTER — Telehealth: Payer: Self-pay | Admitting: Pulmonary Disease

## 2015-09-12 NOTE — Telephone Encounter (Signed)
Called spoke with pt. She reports at her OV, Dr. Lake Bells mentioned a GI doc for her to see. She does not recall the name and it is not in the OV note. Pt reports she was also told by Dr. Anastasia Pall nurse that she remembers him mentioning a GI doc for her to see but did not recall name either. Please advise Dr. Lake Bells thanks

## 2015-09-13 ENCOUNTER — Ambulatory Visit: Payer: Medicare Other

## 2015-09-14 ENCOUNTER — Telehealth: Payer: Self-pay | Admitting: Gastroenterology

## 2015-09-14 NOTE — Telephone Encounter (Signed)
Called spoke with pt. Reviewed BQ's recs. She voiced understanding and had no further questions.

## 2015-09-14 NOTE — Telephone Encounter (Signed)
Nandigam

## 2015-09-18 ENCOUNTER — Encounter: Payer: Self-pay | Admitting: Gastroenterology

## 2015-09-18 NOTE — Telephone Encounter (Signed)
Dr. Silverio Decamp reviewed records and has accepted patient. Ok to schedule OV. Appointment scheduled.

## 2015-12-03 ENCOUNTER — Encounter (INDEPENDENT_AMBULATORY_CARE_PROVIDER_SITE_OTHER): Payer: Self-pay

## 2015-12-03 ENCOUNTER — Ambulatory Visit (INDEPENDENT_AMBULATORY_CARE_PROVIDER_SITE_OTHER): Payer: Medicare Other | Admitting: Gastroenterology

## 2015-12-03 ENCOUNTER — Encounter: Payer: Self-pay | Admitting: Gastroenterology

## 2015-12-03 VITALS — BP 120/70 | HR 80 | Ht 63.5 in | Wt 170.6 lb

## 2015-12-03 DIAGNOSIS — K824 Cholesterolosis of gallbladder: Secondary | ICD-10-CM | POA: Diagnosis not present

## 2015-12-03 DIAGNOSIS — R1310 Dysphagia, unspecified: Secondary | ICD-10-CM | POA: Diagnosis not present

## 2015-12-03 NOTE — Patient Instructions (Signed)
You have been scheduled for an abdominal ultrasound at [facility] (1st floor of hospital) on 12/11/2015 at 8:30am. Please arrive 15 minutes prior to your appointment for registration. Make certain not to have anything to eat or drink After midnight  prior to your appointment. Should you need to reschedule your appointment, please contact radiology at [phone]. This test typically takes about 30 minutes to perform.    You have been scheduled for an esophageal manometry/PH Impedance  at [facility] on 12/17/2015 at 8:30am. Please arrive 30 minutes prior to your procedure for registration. You will need to go to outpatient registration (1st floor of the hospital) first. Make certain to bring your insurance cards as well as a complete list of medications.  Please remember the following:  1) Do not take any muscle relaxants, xanax (alprazolam) or ativan for 1 day prior to your     test as well as the day of the test.  2) Nothing to eat or drink after 12:00 midnight on the night before your test.  3) Hold all diabetic medications/insulin the morning of the test. You may eat and take             your medications after the test.  4) Hold Stomach meds 7 days prior to test  It will take at least 2 weeks to receive the results of this test from your physician. ------------------------------------------ ABOUT ESOPHAGEAL MANOMETRY Esophageal manometry (muh-NOM-uh-tree) is a test that gauges how well your esophagus works. Your esophagus is the long, muscular tube that connects your throat to your stomach. Esophageal manometry measures the rhythmic muscle contractions (peristalsis) that occur in your esophagus when you swallow. Esophageal manometry also measures the coordination and force exerted by the muscles of your esophagus.  During esophageal manometry, a thin, flexible tube (catheter) that contains sensors is passed through your nose, down your esophagus and into your stomach.  Esophageal manometry can be helpful in diagnosing some mostly uncommon disorders that affect your esophagus.  Why it's done Esophageal manometry is used to evaluate the movement (motility) of food through the esophagus and into the stomach. The test measures how well the circular bands of muscle (sphincters) at the top and bottom of your esophagus open and close, as well as the pressure, strength and pattern of the wave of esophageal muscle contractions that moves food along.  What you can expect Esophageal manometry is an outpatient procedure done without sedation. Most people tolerate it well. You may be asked to change into a hospital gown before the test starts.  During esophageal manometry  While you are sitting up, a member of your health care team sprays your throat with a numbing medication or puts numbing gel in your nose or both.  A catheter is guided through your nose into your esophagus. The catheter may be sheathed in a water-filled sleeve. It doesn't interfere with your breathing. However, your eyes may water, and you may gag. You may have a slight nosebleed from irritation.  After the catheter is in place, you may be asked to lie on your back on an exam table, or you may be asked to remain seated.  You then swallow small sips of water. As you do, a computer connected to the catheter records the pressure, strength and pattern of your esophageal muscle contractions.  During the test, you'll be asked to breathe slowly and smoothly, remain as still as possible, and swallow only when you're asked to do so.  A member  of your health care team may move the catheter down into your stomach while the catheter continues its measurements.  The catheter then is slowly withdrawn. The test usually lasts 20 to 30 minutes.  After esophageal manometry  When your esophageal manometry is complete, you may return to your normal activities  This test typically takes 30-45 minutes to  complete. ________________________________________________________________________________

## 2015-12-11 ENCOUNTER — Ambulatory Visit (HOSPITAL_COMMUNITY)
Admission: RE | Admit: 2015-12-11 | Discharge: 2015-12-11 | Disposition: A | Discharge status: Home / Self Care | Payer: Medicare Other | Source: Ambulatory Visit | Attending: Gastroenterology | Admitting: Gastroenterology

## 2015-12-11 DIAGNOSIS — K824 Cholesterolosis of gallbladder: Secondary | ICD-10-CM | POA: Diagnosis not present

## 2015-12-17 ENCOUNTER — Ambulatory Visit (HOSPITAL_COMMUNITY)
Admission: RE | Admit: 2015-12-17 | Discharge: 2015-12-17 | Disposition: A | Discharge status: Home / Self Care | Payer: Medicare Other | Source: Ambulatory Visit | Attending: Gastroenterology | Admitting: Gastroenterology

## 2015-12-17 ENCOUNTER — Encounter (HOSPITAL_COMMUNITY)
Admission: RE | Disposition: A | Discharge status: Home / Self Care | Payer: Self-pay | Source: Ambulatory Visit | Attending: Gastroenterology

## 2015-12-17 DIAGNOSIS — R1310 Dysphagia, unspecified: Secondary | ICD-10-CM | POA: Insufficient documentation

## 2015-12-17 HISTORY — PX: PH IMPEDANCE STUDY: SHX5565

## 2015-12-17 HISTORY — PX: ESOPHAGEAL MANOMETRY: SHX5429

## 2015-12-17 SURGERY — MANOMETRY, ESOPHAGUS

## 2015-12-17 MED ORDER — LIDOCAINE VISCOUS 2 % MT SOLN
OROMUCOSAL | Status: AC
  Filled 2015-12-17: qty 15

## 2015-12-17 SURGICAL SUPPLY — 2 items
FACESHIELD LNG OPTICON STERILE (SAFETY) IMPLANT
GLOVE BIO SURGEON STRL SZ8 (GLOVE) ×4 IMPLANT

## 2015-12-17 NOTE — Progress Notes (Signed)
Esophageal Manometry done per protocol. Pt tolerated well and without complication. PH with impedance probe placed at 41cm without difficulty. Pt instructed on digitrapper use and study using teach back. Pt verbalized understanding. Pt will return 945 or after on 12/18/2015 to [facility] endoscopy unit.  Report to be sent to Dr.Nandigam

## 2015-12-19 ENCOUNTER — Encounter (HOSPITAL_COMMUNITY): Payer: Self-pay | Admitting: Gastroenterology

## 2015-12-25 DIAGNOSIS — R1310 Dysphagia, unspecified: Secondary | ICD-10-CM

## 2016-01-07 NOTE — Progress Notes (Signed)
Brooke Jimenez    [id number]    09-24-1945  Primary Care Physician:Jimenez,SARA, MD  Referring Physician: Cleone Slim. [name], MD [id number] [place], [place] [address]  Chief complaint:  Dysphagia  HPI: 70 year old female with history of bronchiectasis and obstructive lung disease followed by Dr. Lake Bells at [facility] pulmonology. Patient reports to have intermittent difficulty swallowing especially with pills and has persistent cough. She does report having postnasal drip intermittently and sinus problems. No difficulty with swallowing liquids or solids. He has globus sensation back of her throat. Barium esophagram in April 2015 showed silent aspiration into the tracheobronchial tree and also mild presbyesophagus and small hiatal hernia Recent modified barium swallow in July 2017 showed no pharyngeal residue post swallow and trace penetration of thin liquids when patient was asked to consume large consecutive boluses. No recent weight loss. EGD in September 2016 was unremarkable with biopsies negative for eosinophilic esophagitis, no obvious hiatal hernia. Colonoscopy showed left-sided diverticulosis otherwise negative study.   Outpatient Encounter Prescriptions as of 12/03/2015  Medication Sig  . acetaminophen (TYLENOL) 500 MG tablet Take 500 mg by mouth every 6 (six) hours as needed for mild pain.  . B Complex Vitamins (VITAMIN-B COMPLEX PO) Take 1 tablet by mouth daily.  . calcium carbonate (TUMS - DOSED IN MG ELEMENTAL CALCIUM) 500 MG chewable tablet Chew 3 tablets by mouth daily as needed.   Marland Kitchen CALCIUM PO Take 1 tablet by mouth daily.   . clindamycin (CLEOCIN) 150 MG capsule Take 600 mg by mouth. One hour before dental appointments  . ibuprofen (ADVIL,MOTRIN) 200 MG tablet Take 200 mg by mouth 2 (two) times daily as needed for headache or moderate pain.  Marland Kitchen levothyroxine (SYNTHROID, LEVOTHROID) 50 MCG tablet Take 75 mcg by mouth daily before breakfast.   . Lutein 20 MG  TABS Take 1 tablet by mouth daily.  . Multiple Vitamin (MULTIVITAMIN WITH MINERALS) TABS tablet Take 1 tablet by mouth daily.  . [DISCONTINUED] Cholecalciferol (VITAMIN D-3 PO) Take by mouth.  . [DISCONTINUED] Magnesium 250 MG TABS Take 250 mg by mouth daily.   . [DISCONTINUED] sodium chloride (MURO 128) 2 % ophthalmic solution Place 1 drop into both eyes at bedtime.   No facility-administered encounter medications on file as of 12/03/2015.     Allergies as of 12/03/2015 - Review Complete 12/03/2015  Allergen Reaction Noted  . Codeine Nausea And Vomiting 10/07/2013  . Epinephrine  10/17/2013  . Erythromycin Other (See Comments) 10/07/2013  . Guaifenesin Nausea Only 04/27/2015  . Levaquin [levofloxacin in d5w] Nausea And Vomiting 10/07/2013  . Minocycline  08/30/2015  . Neosporin  [neomycin-bacitracin zn-polymyx]  04/27/2015  . Adhesive [tape] Rash 10/04/2013  . Nickel Rash 04/27/2015  . Nsaids Nausea And Vomiting 04/27/2015  . Penicillins Rash 10/07/2013  . Sulfa antibiotics Rash 10/07/2013    Past Medical History:  Diagnosis Date  . Acne rosacea   . Anxiety   . Arthritis    arthritis in hip   . Bronchiectasis ([place])   . Cataract    beginning  . Complication of anesthesia    "really sore throat"  . Corneal dystrophy   . DDD (degenerative disc disease), cervical    and lower back  . Diverticulosis   . Dysphagia    chronic- please see ultrasound done 4/16 15 in [facility]   . Dysrhythmia    sometimes patient has extra beats per patient   . Esophageal dysmotility   .  Family history of adverse reaction to anesthesia    mother slow to wake up   . GERD (gastroesophageal reflux disease)    under control  . Glaucoma    "suspect"  . Gout    one finger x1 attack  . H/O hiatal hernia   . 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 wears hearing aides  . Heart murmur    no problem   . History of chicken pox   . History of melanoma    melanoma- 1986 and 1997   .  Hypothyroidism   . Low back pain with sciatica   . Measles    hx of  . Mitral valve prolapse   . Nocturia   . Osteoporosis   . Peripheral neuropathy (HCC)    feet  . PONV (postoperative nausea and vomiting)   . PTSD (post-traumatic stress disorder) 2006  . RSD (reflex sympathetic dystrophy) 1993  . Spider veins   . Urinary incontinence    occasional  . Varicose veins     Past Surgical History:  Procedure Laterality Date  . ESOPHAGEAL MANOMETRY N/A 12/17/2015   Procedure: ESOPHAGEAL MANOMETRY (EM);  Surgeon: Mauri Pole, MD;  Location: [facility];  Service: Endoscopy;  Laterality: N/A;  . HARDWARE REMOVAL Left 10/17/2013   Procedure: HARDWARE REMOVAL LEFT HIP;  Surgeon: Gearlean Alf, MD;  Location: [facility];  Service: Orthopedics;  Laterality: Left;  . HIP FRACTURE SURGERY Left 2006   3 screws  . INSERTION OF MESH  02/08/2014   Procedure: INSERTION OF MESH;  Surgeon: Jackolyn Confer, MD;  Location: [facility];  Service: General;;  . melanoma surgery   1986, 1997  . PARATHYROIDECTOMY  04/02/15  . [place] IMPEDANCE STUDY N/A 12/17/2015   Procedure: [place] IMPEDANCE STUDY;  Surgeon: Mauri Pole, MD;  Location: [facility];  Service: Endoscopy;  Laterality: N/A;  . TOTAL HIP ARTHROPLASTY Left 04/10/2014   Procedure: LEFT TOTAL HIP ARTHROPLASTY ANTERIOR APPROACH;  Surgeon: Gearlean Alf, MD;  Location: [facility];  Service: Orthopedics;  Laterality: Left;  . UMBILICAL HERNIA REPAIR N/A 02/08/2014   Procedure: HERNIA REPAIR UMBILICAL ADULT WITH MESH;  Surgeon: Jackolyn Confer, MD;  Location: [facility];  Service: General;  Laterality: N/A;    Family History  Problem Relation Age of Onset  . Cancer Mother   . Hypothyroidism Mother   . Congestive Heart Failure Mother   . Hypertension Mother   . Heart failure Father     Social History   Social History  . Marital status: Married    Spouse name: N/A  . Number of children: N/A  . Years of education: N/A   Occupational History    . Not on file.   Social History Main Topics  . Smoking status: Never Smoker  . Smokeless tobacco: Never Used  . Alcohol use No  . Drug use: No  . Sexual activity: Not on file   Other Topics Concern  . Not on file   Social History Narrative  . No narrative on file      Review of systems: Review of Systems  Constitutional: Negative for fever and chills.  HENT: Negative.   Eyes: Negative for blurred vision.  Respiratory: Positive for cough, shortness of breath and wheezing.   Cardiovascular: Negative for chest pain and palpitations.  Gastrointestinal: as per HPI Genitourinary: Negative for dysuria, urgency, frequency and hematuria.  Musculoskeletal: Negative for myalgias, back pain and joint pain.  Skin: Negative for itching  and rash.  Neurological: Negative for dizziness, tremors, focal weakness, seizures and loss of consciousness.  Endo/Heme/Allergies: Positive for seasonal allergies.  Psychiatric/Behavioral: Negative for depression, suicidal ideas and hallucinations.  All other systems reviewed and are negative.   Physical Exam: Vitals:   12/03/15 1416  BP: 120/70  Pulse: 80   Body mass index is 29.75 kg/m. Gen:      No acute distress HEENT:  EOMI, sclera anicteric Neck:     No masses; no thyromegaly Lungs:    Clear to auscultation bilaterally; normal respiratory effort CV:         Regular rate and rhythm; no murmurs Abd:      + bowel sounds; soft, non-tender; no palpable masses, no distension Ext:    No edema; adequate peripheral perfusion Skin:      Warm and dry; no rash Neuro: alert and oriented x 3 Psych: normal mood and affect  Data Reviewed:  Reviewed labs, radiology imaging, old records and pertinent past GI work up   Assessment and Plan/Recommendations: 70 year old female with history of chronic dysphagia, bronchiectasis here for evaluation Patient does have a complement of oropharyngeal dysphagia with laryngeal penetration on swallowing large  amount of thin liquids on consecutive boluses Patient has tried multiple PPI with no difference in her symptoms We'll schedule for esophageal manometry and 24 pH impedance off PPI She has history of gallbladder polyp, agile for abdominal ultrasound for surveillance Due for colonoscopy in 2021 for colorectal cancerscreening  Greater than 50% of the time used for counseling as well as treatment plan and follow-up. She had multiple questions which were answered to her satisfaction  Damaris Hippo , MD [phone] Mon-Fri 8a-5p [phone] after 5p, weekends, holidays  CC: Karleen Hampshire., MD

## 2016-01-28 ENCOUNTER — Telehealth: Payer: Self-pay | Admitting: Gastroenterology

## 2016-01-28 NOTE — Telephone Encounter (Signed)
Please schedule for a office visit for evaluation. 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

## 2016-01-28 NOTE — Telephone Encounter (Signed)
Brooke Jimenez is scheduled to come in for a follow up of her mano and u/s of the abdomen tomorrow.  She is calling with complaints of RUQ pain that is intense and then not too bad. Waxes and wanes. She says the discomfort is not new, but the intensity and duration are. She vomited repeatedly on Sunday. She has had this pain since. Sharp and awakened her early in the morning. Took Ibuprofen. Helped enough to help her go back to sleep. She has eaten a banana, crackers, rice and broth over the past several hours without increasing the pain or vomiting. She is being still in her. Any suggestions? I have asked her to try heat to the area.

## 2016-01-28 NOTE — Telephone Encounter (Signed)
Patient advised.

## 2016-01-29 ENCOUNTER — Ambulatory Visit (INDEPENDENT_AMBULATORY_CARE_PROVIDER_SITE_OTHER): Payer: Medicare Other | Admitting: Gastroenterology

## 2016-01-29 ENCOUNTER — Encounter: Payer: Self-pay | Admitting: Gastroenterology

## 2016-01-29 VITALS — BP 100/60 | HR 60 | Ht <= 58 in | Wt 167.5 lb

## 2016-01-29 DIAGNOSIS — R1011 Right upper quadrant pain: Secondary | ICD-10-CM

## 2016-01-29 DIAGNOSIS — M7918 Myalgia, other site: Secondary | ICD-10-CM

## 2016-01-29 DIAGNOSIS — M791 Myalgia: Secondary | ICD-10-CM

## 2016-01-29 NOTE — Progress Notes (Signed)
Brooke Jimenez    [id number]    [birth year]  Primary Care Physician:FURR,SARA, MD  Referring Physician: Cleone Slim. [name], MD [id number] [place], [place] [address]  Chief complaint:  Abdominal pain  HPI: 70 year old female with history of bronchiectasis and obstructive lung disease , last seen in office in October 2017 is here for follow-up visit with complaints of abdominal pain . She was last seen with complaints of intermittent dysphagia and globus sensation . Underwent esophageal manometry and 24-hour pH impedance which showed normal esophageal motility and no evidence of increased gastroesophageal acid reflux events .Barium esophagram in April 2015 showed silent aspiration into the tracheobronchial tree and also mild presbyesophagus and small hiatal hernia. Recent modified barium swallow in July 2017 showed no pharyngeal residue post swallow and trace penetration of thin liquids when patient was asked to consume large consecutive boluses. No recent weight loss. EGD in September 2016 was unremarkable with biopsies negative for eosinophilic esophagitis, no obvious hiatal hernia. Colonoscopy showed left-sided diverticulosis otherwise negative study.   Abdominal pain started after she had intractable vomiting over the weekend. Patient was out walking Friday night for festival of lights and reports not eating or drinking anything whole day.  Large breakfast the following day and started having nausea and vomiting. No diarrhea, melena or blood per rectum. Nausea and vomiting has resolved since, lasted less than 24 hours.   Outpatient Encounter Prescriptions as of 01/29/2016  Medication Sig  . acetaminophen (TYLENOL) 500 MG tablet Take 500 mg by mouth every 6 (six) hours as needed for mild pain.  . B Complex Vitamins (VITAMIN-B COMPLEX PO) Take 1 tablet by mouth daily.  . calcium carbonate (TUMS - DOSED IN MG ELEMENTAL CALCIUM) 500 MG chewable tablet Chew 3 tablets by mouth  daily as needed.   Marland Kitchen CALCIUM PO Take 1 tablet by mouth daily.   . clindamycin (CLEOCIN) 150 MG capsule Take 600 mg by mouth. One hour before dental appointments  . ibuprofen (ADVIL,MOTRIN) 200 MG tablet Take 200 mg by mouth 2 (two) times daily as needed for headache or moderate pain.  Marland Kitchen levothyroxine (SYNTHROID, LEVOTHROID) 75 MCG tablet Take 75 mcg by mouth daily before breakfast.  . Lutein 20 MG TABS Take 1 tablet by mouth daily.  . Multiple Vitamin (MULTIVITAMIN WITH MINERALS) TABS tablet Take 1 tablet by mouth daily.  . [DISCONTINUED] levothyroxine (SYNTHROID, LEVOTHROID) 50 MCG tablet Take 75 mcg by mouth daily before breakfast.    No facility-administered encounter medications on file as of 01/29/2016.     Allergies as of 01/29/2016 - Review Complete 12/17/2015  Allergen Reaction Noted  . Codeine Nausea And Vomiting 10/07/2013  . Epinephrine  10/17/2013  . Erythromycin Other (See Comments) 10/07/2013  . Guaifenesin Nausea Only 04/27/2015  . Levaquin [levofloxacin in d5w] Nausea And Vomiting 10/07/2013  . Minocycline  08/30/2015  . Neosporin  [neomycin-bacitracin zn-polymyx]  04/27/2015  . Adhesive [tape] Rash 10/04/2013  . Nickel Rash 04/27/2015  . Nsaids Nausea And Vomiting 04/27/2015  . Penicillins Rash 10/07/2013  . Sulfa antibiotics Rash 10/07/2013    Past Medical History:  Diagnosis Date  . Acne rosacea   . Anxiety   . Arthritis    arthritis in hip   . Bronchiectasis ([place])   . Cataract    beginning  . Complication of anesthesia    "really sore throat"  . Corneal dystrophy   . DDD (degenerative disc disease), cervical  and lower back  . Diverticulosis   . Dysphagia    chronic- please see ultrasound done 4/16 15 in [facility]   . Dysrhythmia    sometimes patient has extra beats per patient   . Esophageal dysmotility   . Family history of adverse reaction to anesthesia    mother slow to wake up   . GERD (gastroesophageal reflux disease)    under control  .  Glaucoma    "suspect"  . Gout    one finger x1 attack  . H/O hiatal hernia   . 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 wears hearing aides  . Heart murmur    no problem   . History of chicken pox   . History of melanoma    melanoma- 1986 and 1997   . Hypothyroidism   . Low back pain with sciatica   . Measles    hx of  . Mitral valve prolapse   . Nocturia   . Osteoporosis   . Peripheral neuropathy (HCC)    feet  . PONV (postoperative nausea and vomiting)   . PTSD (post-traumatic stress disorder) 2006  . RSD (reflex sympathetic dystrophy) 1993  . Spider veins   . Urinary incontinence    occasional  . Varicose veins     Past Surgical History:  Procedure Laterality Date  . ESOPHAGEAL MANOMETRY N/A 12/17/2015   Procedure: ESOPHAGEAL MANOMETRY (EM);  Surgeon: Mauri Pole, MD;  Location: [facility];  Service: Endoscopy;  Laterality: N/A;  . HARDWARE REMOVAL Left 10/17/2013   Procedure: HARDWARE REMOVAL LEFT HIP;  Surgeon: Gearlean Alf, MD;  Location: [facility];  Service: Orthopedics;  Laterality: Left;  . HIP FRACTURE SURGERY Left 2006   3 screws  . INSERTION OF MESH  02/08/2014   Procedure: INSERTION OF MESH;  Surgeon: Jackolyn Confer, MD;  Location: [facility];  Service: General;;  . melanoma surgery   1986, 1997  . PARATHYROIDECTOMY  04/02/15  . [place] IMPEDANCE STUDY N/A 12/17/2015   Procedure: [place] IMPEDANCE STUDY;  Surgeon: Mauri Pole, MD;  Location: [facility];  Service: Endoscopy;  Laterality: N/A;  . TOTAL HIP ARTHROPLASTY Left 04/10/2014   Procedure: LEFT TOTAL HIP ARTHROPLASTY ANTERIOR APPROACH;  Surgeon: Gearlean Alf, MD;  Location: [facility];  Service: Orthopedics;  Laterality: Left;  . UMBILICAL HERNIA REPAIR N/A 02/08/2014   Procedure: HERNIA REPAIR UMBILICAL ADULT WITH MESH;  Surgeon: Jackolyn Confer, MD;  Location: [facility];  Service: General;  Laterality: N/A;    Family History  Problem Relation Age of Onset  . Cancer Mother   . Hypothyroidism  Mother   . Congestive Heart Failure Mother   . Hypertension Mother   . Heart failure Father     Social History   Social History  . Marital status: Married    Spouse name: N/A  . Number of children: N/A  . Years of education: N/A   Occupational History  . Not on file.   Social History Main Topics  . Smoking status: Never Smoker  . Smokeless tobacco: Never Used  . Alcohol use No  . Drug use: No  . Sexual activity: Not on file   Other Topics Concern  . Not on file   Social History Narrative  . No narrative on file      Review of systems: Review of Systems  Constitutional: Negative for fever and chills.  HENT: Negative.   Eyes: Negative for blurred vision.  Respiratory: Positive for  cough, shortness of breath and wheezing.   Cardiovascular: Negative for chest pain and palpitations.  Gastrointestinal: as per HPI Genitourinary: Negative for dysuria, urgency, frequency and hematuria.  Musculoskeletal: Positive for myalgias, back pain and joint pain.  Skin: Negative for itching and rash.  Neurological: Negative for dizziness, tremors, focal weakness, seizures and loss of consciousness.  Endo/Heme/Allergies: Negative for seasonal allergies.  Psychiatric/Behavioral: Negative for depression, suicidal ideas and hallucinations.  All other systems reviewed and are negative.   Physical Exam: Vitals:   01/29/16 1450  BP: 100/60  Pulse: 60   Body mass index is 41.14 kg/m. Gen:      No acute distress HEENT:  EOMI, sclera anicteric Neck:     No masses; no thyromegaly Lungs:    Clear to auscultation bilaterally; normal respiratory effort CV:         Regular rate and rhythm; no murmurs Abd:      + bowel sounds; soft, non-tender; no palpable masses, no distension Ext:    No edema; adequate peripheral perfusion Skin:      Warm and dry; no rash Neuro: alert and oriented x 3 Psych: normal mood and affect  Data Reviewed:  Reviewed labs, radiology imaging, old records and  pertinent past GI work up   Assessment and Plan/Recommendations:  70 year old female with bronchiectasis and COPD here for follow-up visit after an acute episode of nausea and vomiting likely viral gastroenteritis or secondary to his severe dehydration and exhaustion, has since resolved Abdominal pain in the right upper quadrant and epigastric region likely secondary to recurrent episodes of vomiting, musculoskeletal in nature Advised patient to use alternating heat and cold pack; topical analgesic and anti-inflammatory cream If continues to have persistent pain in the next 2-4 weeks,  will consider further workup  15 minutes was spent face-to-face with the patient. Greater than 50% of the time used for counseling as well as treatment plan and follow-up. She had multiple questions which were answered to her satisfaction  Damaris Hippo , MD [phone] Mon-Fri 8a-5p [phone] after 5p, weekends, holidays  CC: Karleen Hampshire., MD

## 2016-01-29 NOTE — Patient Instructions (Signed)
Call in 1 month if pain is worse, we will consider a CT scan at that time

## 2016-02-12 ENCOUNTER — Ambulatory Visit: Payer: Medicare Other | Admitting: Gastroenterology

## 2016-04-01 ENCOUNTER — Ambulatory Visit (INDEPENDENT_AMBULATORY_CARE_PROVIDER_SITE_OTHER): Payer: Medicare Other | Admitting: Adult Health

## 2016-04-01 ENCOUNTER — Encounter: Payer: Self-pay | Admitting: Adult Health

## 2016-04-01 DIAGNOSIS — J479 Bronchiectasis, uncomplicated: Secondary | ICD-10-CM | POA: Diagnosis not present

## 2016-04-01 DIAGNOSIS — Z20828 Contact with and (suspected) exposure to other viral communicable diseases: Secondary | ICD-10-CM | POA: Diagnosis not present

## 2016-04-01 MED ORDER — OSELTAMIVIR PHOSPHATE 75 MG PO CAPS: 75.0000 mg | ORAL_CAPSULE | Freq: Every day | ORAL | 0 refills | Status: AC

## 2016-04-01 NOTE — Assessment & Plan Note (Signed)
Appears stable  Cont w/ GERD/Aspiration prevention  No sign of active flu or flare of bronchiectasis  Cont to control for triggers  Plan  Continue on current regimen

## 2016-04-01 NOTE — Progress Notes (Signed)
@Patient  ID: Brooke Jimenez, female    DOB: 1945-05-04, 71 y.o.   MRN: [id number]  Chief Complaint  Patient presents with  . Acute Visit    Husband has flu     Referring provider: Karleen Hampshire., MD  HPI: 71 yo female seen for pulmonary consult for bronchiectasis in 08/2015  TEST  [facility] scan at [facility] showed evidence of mild bronchiectasis in the bases of her lungs with a patulous esophagus. She had moderate airflow obstruction on pulmonary function testing at that time silent aspiration as this was witnessed on a barium swallow test in May 2014. She's had a negative speech therapy evaluation at [facility]. allergy and immunology where she had no significant evidence of an underlying immunodeficiency with the exception of a mildly low IgM. She should follow-up with immunology regarding this. Her alpha-1 test was negative that time. Because of a lack of family history of bronchiectasis the likelihood of cystic fibrosis is very low.  04/01/2016 Acute OV  Pt presents today per recommendations of her doctor. Her husband was dx with Influenza yesterday , started on Tamiflu . He had 3 days of URI sx prior to visit. She is very concerned about developing the flu . She was recommended to start on prophylaxtic Tamiflu . She has not fever, body aches or discolored mucus. Good appetite w/ no n/v/d.   Overall her breathing has been doing better over last 6 months. Does have some occasional cough and post nasal drainage.   She had the carpet removed in her home and feels this has helped .    Allergies  Allergen Reactions  . Codeine Nausea And Vomiting    DILAUDID AND TRAMADOL ARE OKAY.   Marland Kitchen Epinephrine     Heart pounds very hard  . Erythromycin Other (See Comments)    Abdominal pain   . Guaifenesin Nausea Only  . Levaquin [Levofloxacin In D5w] Nausea And Vomiting  . Minocycline     Skin discoloration on ankles per pt  . Neosporin  [Neomycin-Bacitracin Zn-Polymyx]    Other reaction(s): Other (See Comments) Blisters, Erythema  . Adhesive [Tape] Rash  . Nickel Rash  . Nsaids     Stomach aches  . Penicillins Rash  . Sulfa Antibiotics Rash    Immunization History  Administered Date(s) Administered  . Influenza Split 10/31/2014, 10/31/2015  . Pneumococcal Conjugate-13 10/31/2014    Past Medical History:  Diagnosis Date  . Acne rosacea   . Anxiety   . Arthritis    arthritis in hip   . Bronchiectasis ([place])   . Cataract    beginning  . Complication of anesthesia    "really sore throat"  . Corneal dystrophy   . DDD (degenerative disc disease), cervical    and lower back  . Diverticulosis   . Dysphagia    chronic- please see ultrasound done 4/16 15 in [facility]   . Dysrhythmia    sometimes patient has extra beats per patient   . Esophageal dysmotility   . Family history of adverse reaction to anesthesia    mother slow to wake up   . GERD (gastroesophageal reflux disease)    under control  . Glaucoma    "suspect"  . Gout    one finger x1 attack  . H/O hiatal hernia   . 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 wears hearing aides  . Heart murmur    no problem   . History of chicken  pox   . History of melanoma    melanoma- 1986 and 1997   . Hypothyroidism   . Low back pain with sciatica   . Measles    hx of  . Mitral valve prolapse   . Nocturia   . Osteoporosis   . Peripheral neuropathy (HCC)    feet  . PONV (postoperative nausea and vomiting)   . PTSD (post-traumatic stress disorder) 2006  . RSD (reflex sympathetic dystrophy) 1993  . Spider veins   . Urinary incontinence    occasional  . Varicose veins     Tobacco History: History  Smoking Status  . Never Smoker  Smokeless Tobacco  . Never Used   Counseling given: Not Answered   Outpatient Encounter Prescriptions as of 04/01/2016  Medication Sig  . acetaminophen (TYLENOL) 500 MG tablet Take 500 mg by mouth every 6 (six) hours as needed for mild pain.  . B  Complex Vitamins (VITAMIN-B COMPLEX PO) Take 1 tablet by mouth daily.  . calcium carbonate (TUMS - DOSED IN MG ELEMENTAL CALCIUM) 500 MG chewable tablet Chew 3 tablets by mouth daily as needed.   Marland Kitchen CALCIUM PO Take 1 tablet by mouth daily.   . clindamycin (CLEOCIN) 150 MG capsule Take 600 mg by mouth. One hour before dental appointments  . ibuprofen (ADVIL,MOTRIN) 200 MG tablet Take 200 mg by mouth 2 (two) times daily as needed for headache or moderate pain.  Marland Kitchen levothyroxine (SYNTHROID, LEVOTHROID) 75 MCG tablet Take 75 mcg by mouth daily before breakfast.  . Lutein 20 MG TABS Take 1 tablet by mouth daily.  . Multiple Vitamin (MULTIVITAMIN WITH MINERALS) TABS tablet Take 1 tablet by mouth daily.  Marland Kitchen oseltamivir (TAMIFLU) 75 MG capsule Take 1 capsule (75 mg total) by mouth daily.   No facility-administered encounter medications on file as of 04/01/2016.      Review of Systems  Constitutional:   No  weight loss, night sweats,  Fevers, chills, fatigue, or  lassitude.  HEENT:   No headaches,  Difficulty swallowing,  Tooth/dental problems, or  Sore throat,                No sneezing, itching, ear ache, nasal congestion, post nasal drip,   CV:  No chest pain,  Orthopnea, PND, swelling in lower extremities, anasarca, dizziness, palpitations, syncope.   GI  No heartburn, indigestion, abdominal pain, nausea, vomiting, diarrhea, change in bowel habits, loss of appetite, bloody stools.   Resp: No shortness of breath with exertion or at rest.  No excess mucus, no productive cough,  No non-productive cough,  No coughing up of blood.  No change in color of mucus.  No wheezing.  No chest wall deformity  Skin: no rash or lesions.  GU: no dysuria, change in color of urine, no urgency or frequency.  No flank pain, no hematuria   MS:  No joint pain or swelling.  No decreased range of motion.  No back pain.    Physical Exam  BP 128/76 (BP Location: Left Arm, Cuff Size: Normal)   Pulse 75   Temp 99.5  F (37.5 C) (Oral)   Ht 5' 3.5" (1.613 m)   Wt 171 lb 12.8 oz (77.9 kg)   SpO2 99%   BMI 29.96 kg/m   GEN: A/Ox3; pleasant , NAD, well nourished    HEENT:  [place]/AT,  EACs-clear, TMs-wnl, NOSE-clear, THROAT-clear, no lesions, no postnasal drip or exudate noted.   NECK:  Supple w/ fair ROM; no JVD;  normal carotid impulses w/o bruits; no thyromegaly or nodules palpated; no lymphadenopathy.    RESP  Clear  P & A; w/o, wheezes/ rales/ or rhonchi. no accessory muscle use, no dullness to percussion  CARD:  RRR, no m/r/g, no peripheral edema, pulses intact, no cyanosis or clubbing.  GI:   Soft & nt; nml bowel sounds; no organomegaly or masses detected.   Musco: Warm bil, no deformities or joint swelling noted.   Neuro: alert, no focal deficits noted.    Skin: Warm, no lesions or rashes  Psych:  No change in mood or affect. No depression or anxiety.  No memory loss.  Lab Results: BNP No results found for: BNP  ProBNP No results found for: PROBNP  Imaging: No results found.   Assessment & Plan:   Bronchiectasis ([place]) Appears stable  Cont w/ GERD/Aspiration prevention  No sign of active flu or flare of bronchiectasis  Cont to control for triggers  Plan  Continue on current regimen   Exposure to influenza Discussed Flu symptoms and supportive care and prevention  She would like to start Tamiflu , pt education on Tamiflu given  Plan  tamiflu 75mg  daily x 7 d.      Rexene Edison, NP 04/01/2016

## 2016-04-01 NOTE — Assessment & Plan Note (Signed)
Discussed Flu symptoms and supportive care and prevention  She would like to start Tamiflu , pt education on Tamiflu given  Plan  tamiflu 75mg  daily x 7 d.

## 2016-04-01 NOTE — Progress Notes (Signed)
Reviewed, agree with this plan of care 

## 2016-04-01 NOTE — Patient Instructions (Signed)
Begin Tamiflu 75mg  daily for 7 days -take w/ food.  Push fluids  Follow up Dr. Lake Bells in 6 months and As needed   Please contact office for sooner follow up if symptoms do not improve or worsen or seek emergency care

## 2016-04-10 ENCOUNTER — Telehealth: Payer: Self-pay | Admitting: Adult Health

## 2016-04-10 NOTE — Telephone Encounter (Signed)
Spoke with pt. She is aware of TP's recommendations. Nothing further was needed.

## 2016-04-10 NOTE — Telephone Encounter (Signed)
Stop Tamiflu .  May take 3 day of Zyrtec for allergic reaction  Make sure she has no difficulty breathing , swelling of lips.tongue Bonnita Nasuti.  Push fluids  Call back if not improivng  Please contact office for sooner follow up if symptoms do not improve or worsen or seek emergency care

## 2016-04-10 NOTE — Telephone Encounter (Signed)
Called and spoke to pt. Pt states she was given Tamiflu on 2.6.18. Pt states she was having itching on her nose and around her mouth, with minimal redness. Pt denies increase in SOB, swelling CP/tightness, dizziness. Pt states her s/s started on 2.13.18. Pt was given 10 capsule by the pharmacy but the rx only states to take Tamiflu daily for 7 days. Called and spoke pharmacist, Opal Sidles, at [facility] and was advised the pt was given 10 tbs to take daily. Opal Sidles states the Tamiflu comes in a prefilled pack and there isnt a way for them to dispense only 7 capsules.   Tammy please advise if you would like for pt to take last Tamiflu capsule today 04/10/16 and advise on pt's recent s/s on her nose and mouth. Thanks.

## 2016-06-02 ENCOUNTER — Encounter: Payer: Self-pay | Admitting: Pulmonary Disease

## 2016-06-02 ENCOUNTER — Ambulatory Visit (INDEPENDENT_AMBULATORY_CARE_PROVIDER_SITE_OTHER): Payer: Medicare Other | Admitting: Pulmonary Disease

## 2016-06-02 DIAGNOSIS — J479 Bronchiectasis, uncomplicated: Secondary | ICD-10-CM | POA: Diagnosis not present

## 2016-06-02 NOTE — Assessment & Plan Note (Signed)
Mild disease with out exacerbation.  Likely due to recurrent aspiration, possibly post prandial based on swallowing evaluation.     She has significant anxiety about her lung disease. I reassured her today that she has mild lung disease at best.    Plan: f/u in one year Stay active Hand hygiene encouraged

## 2016-06-02 NOTE — Progress Notes (Signed)
Subjective:    Patient ID: Brooke Jimenez, female    DOB: [birth year], 71 y.o.   MRN: [id number]  Synopsis: Referred in 2017 for bronchiectasis in the setting of likely chronic aspiration and dysphagia.  HPI  Chief Complaint  Patient presents with  . Follow-up    Breathing is okay since seeing TP in Feb. Occassionally SOB and mucus production with cough    Brooke Jimenez had significant flu exposure in February and took tamiflu for it.  She says that she has been doing well.  She notes that when she walks she will sometimes have mild dyspnea.  She notes intermittent dyspnea when she climbs a flight of stairs. She notes tightness in her chest intermittently.  She says that she can't detect any triggers in the cause for the chest tightness.    Past Medical History:  Diagnosis Date  . Acne rosacea   . Anxiety   . Arthritis    arthritis in hip   . Bronchiectasis ([place])   . Cataract    beginning  . Complication of anesthesia    "really sore throat"  . Corneal dystrophy   . DDD (degenerative disc disease), cervical    and lower back  . Diverticulosis   . Dysphagia    chronic- please see ultrasound done 4/16 15 in [facility]   . Dysrhythmia    sometimes patient has extra beats per patient   . Esophageal dysmotility   . Family history of adverse reaction to anesthesia    mother slow to wake up   . GERD (gastroesophageal reflux disease)    under control  . Glaucoma    "suspect"  . Gout    one finger x1 attack  . H/O hiatal hernia   . 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 wears hearing aides  . Heart murmur    no problem   . History of chicken pox   . History of melanoma    melanoma- 1986 and 1997   . Hypothyroidism   . Low back pain with sciatica   . Measles    hx of  . Mitral valve prolapse   . Nocturia   . Osteoporosis   . Peripheral neuropathy (HCC)    feet  . PONV (postoperative nausea and vomiting)   . PTSD (post-traumatic stress disorder) 2006  . RSD (reflex  sympathetic dystrophy) 1993  . Spider veins   . Urinary incontinence    occasional  . Varicose veins      Review of Systems     Objective:   Physical Exam Vitals:   06/02/16 1153  BP: 120/80  Pulse: 73  SpO2: 100%  Weight: 172 lb 6.4 oz (78.2 kg)  Height: 5' 3.5" (1.613 m)   RA  Gen: well appearing HENT: OP clear, TM's clear, neck supple PULM: CTA B, normal percussion CV: RRR, no mgr, trace edema GI: BS+, soft, nontender Derm: no cyanosis or rash Psyche: normal mood and affect   Records from her last visit with [place] reviewed where she was treated with tamiflu for a flu exposure.  2017 CT chest [facility] reviewed showing mild bronchiectasis in the bases, some mucus impaction  Modified barium swallow test reviewed showing no pharyngeal aspiration but likely postprandial aspiration  2017 spirometry flow volume loop from [facility] reviewed showing airflow obstruction but no volume parameters listed.     Assessment & Plan:  Bronchiectasis ([place]) Mild disease with out exacerbation.  Likely  due to recurrent aspiration, possibly post prandial based on swallowing evaluation.     She has significant anxiety about her lung disease. I reassured her today that she has mild lung disease at best.    Plan: f/u in one year Stay active Hand hygiene encouraged  > 50% of this 30 minute visit spent answering numerous questions face to face   Current Outpatient Prescriptions:  .  acetaminophen (TYLENOL) 500 MG tablet, Take 500 mg by mouth every 6 (six) hours as needed for mild pain., Disp: , Rfl:  .  B Complex Vitamins (VITAMIN-B COMPLEX PO), Take 1 tablet by mouth daily., Disp: , Rfl:  .  calcium carbonate (TUMS - DOSED IN MG ELEMENTAL CALCIUM) 500 MG chewable tablet, Chew 3 tablets by mouth daily as needed. , Disp: , Rfl:  .  CALCIUM PO, Take 1 tablet by mouth daily. , Disp: , Rfl:  .  clindamycin (CLEOCIN) 150 MG capsule, Take 600 mg by mouth.  One hour before dental appointments, Disp: , Rfl:  .  ibuprofen (ADVIL,MOTRIN) 200 MG tablet, Take 200 mg by mouth 2 (two) times daily as needed for headache or moderate pain., Disp: , Rfl:  .  levothyroxine (SYNTHROID, LEVOTHROID) 75 MCG tablet, Take 75 mcg by mouth daily before breakfast., Disp: , Rfl:  .  Lutein 20 MG TABS, Take 1 tablet by mouth daily., Disp: , Rfl:  .  Multiple Vitamin (MULTIVITAMIN WITH MINERALS) TABS tablet, Take 1 tablet by mouth daily., Disp: , Rfl:

## 2016-06-02 NOTE — Patient Instructions (Signed)
Stay active Practice good hand hygiene when you are out in public Follow up in one year or sooner if needed

## 2016-07-25 IMAGING — CR DG PORTABLE PELVIS
1 series · 1 of 1 positions shown · non-contrast
Comparison: Portable exam 4734 hours without priors for comparison.

CLINICAL DATA: Hip arthroplasty

EXAM:
DG C-ARM 1-60 MIN - NRPT [facility]; PORTABLE PELVIS 1-2 VIEWS

[AP]
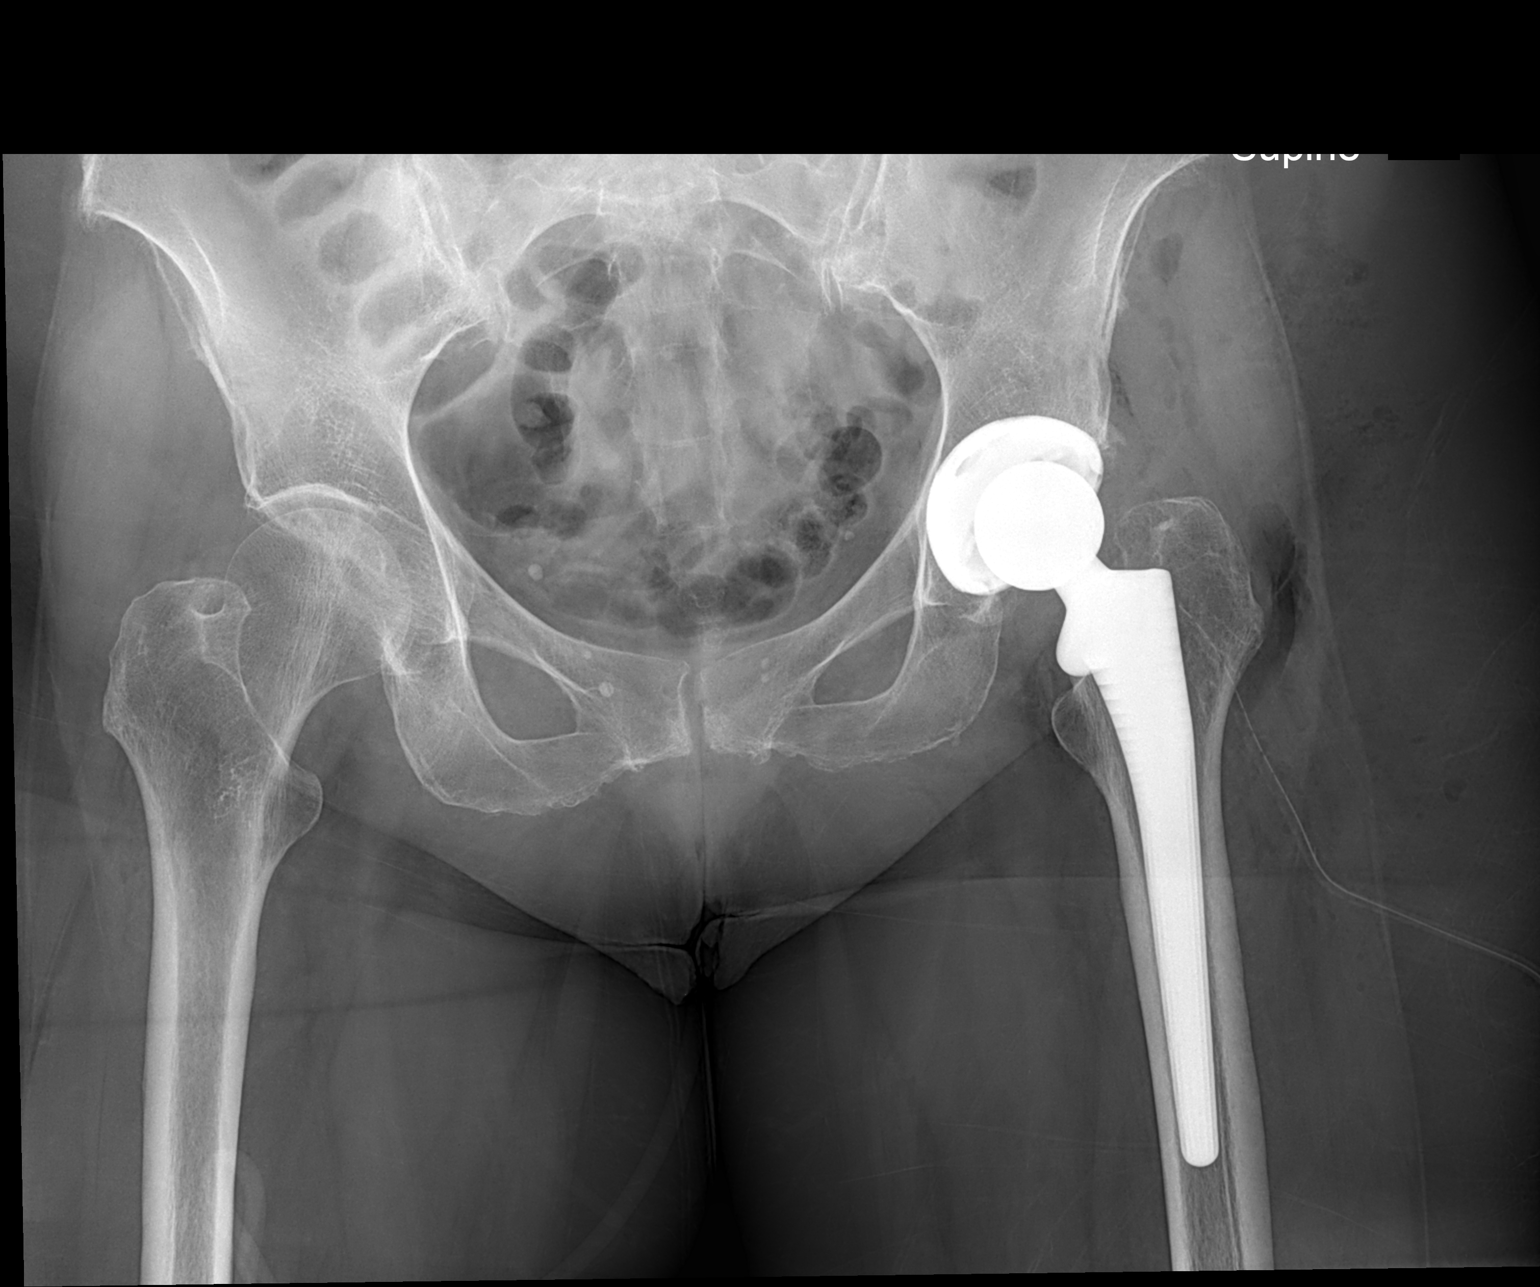

[1 of 1 positions shown; findings below may reference images not displayed]

FLUOROSCOPY TIME:  Radiation Exposure Index (as provided by the
fluoroscopic device): Not provided

If the device does not provide the exposure index:

Fluoroscopy Time:  0 minutes 12 seconds

Number of Acquired Images:  None
FINDINGS: Diffuse osseous demineralization.

Acetabular and femoral components of a LEFT hip prosthesis are
identified.

No acute fracture or dislocation.

SI joints and RIGHT hip joint spaces preserved.

Pelvis intact.

Postsurgical changes of the soft tissues in LEFT hip region with
note of a surgical drain.
IMPRESSION: LEFT hip prosthesis and osseous demineralization without acute
abnormalities.

## 2016-10-13 ENCOUNTER — Encounter (HOSPITAL_COMMUNITY): Payer: Self-pay | Admitting: Emergency Medicine

## 2016-10-13 ENCOUNTER — Emergency Department (HOSPITAL_COMMUNITY)
Admission: EM | Admit: 2016-10-13 | Discharge: 2016-10-13 | Disposition: A | Discharge status: Home / Self Care | Payer: Medicare Other | Attending: Emergency Medicine | Admitting: Emergency Medicine

## 2016-10-13 ENCOUNTER — Emergency Department (HOSPITAL_COMMUNITY): Payer: Medicare Other

## 2016-10-13 DIAGNOSIS — R112 Nausea with vomiting, unspecified: Secondary | ICD-10-CM | POA: Diagnosis present

## 2016-10-13 DIAGNOSIS — Z79899 Other long term (current) drug therapy: Secondary | ICD-10-CM | POA: Insufficient documentation

## 2016-10-13 DIAGNOSIS — B029 Zoster without complications: Secondary | ICD-10-CM | POA: Insufficient documentation

## 2016-10-13 DIAGNOSIS — R1032 Left lower quadrant pain: Secondary | ICD-10-CM | POA: Diagnosis not present

## 2016-10-13 DIAGNOSIS — R197 Diarrhea, unspecified: Secondary | ICD-10-CM | POA: Diagnosis not present

## 2016-10-13 LAB — COMPREHENSIVE METABOLIC PANEL
ALBUMIN: 4.4 g/dL (ref 3.5–5.0)
ALK PHOS: 40 U/L (ref 38–126)
ALT: 26 U/L (ref 14–54)
ANION GAP: 9 (ref 5–15)
AST: 33 U/L (ref 15–41)
BUN: 7 mg/dL (ref 6–20)
CALCIUM: 9 mg/dL (ref 8.9–10.3)
CO2: 25 mmol/L (ref 22–32)
Chloride: 102 mmol/L (ref 101–111)
Creatinine, Ser: 0.85 mg/dL (ref 0.44–1.00)
GFR calc Af Amer: >60 mL/min (ref >60)
GFR calc non Af Amer: >60 mL/min (ref >60)
Glucose, Bld: 107 mg/dL — ABNORMAL HIGH (ref 65–99)
POTASSIUM: 3.4 mmol/L — AB (ref 3.5–5.1)
SODIUM: 136 mmol/L (ref 135–145)
Total Bilirubin: 0.6 mg/dL (ref 0.3–1.2)
Total Protein: 7.2 g/dL (ref 6.5–8.1)

## 2016-10-13 LAB — CBC
HCT: 41.2 % (ref 36.0–46.0)
HEMOGLOBIN: 14.1 g/dL (ref 12.0–15.0)
MCH: 30.2 pg (ref 26.0–34.0)
MCHC: 34.2 g/dL (ref 30.0–36.0)
MCV: 88.2 fL (ref 78.0–100.0)
Platelets: 219 10*3/uL (ref 150–400)
RBC: 4.67 MIL/uL (ref 3.87–5.11)
RDW: 14.5 % (ref 11.5–15.5)
WBC: 6.3 10*3/uL (ref 4.0–10.5)

## 2016-10-13 LAB — URINALYSIS, ROUTINE W REFLEX MICROSCOPIC
Bilirubin Urine: NEGATIVE
Glucose, UA: NEGATIVE mg/dL
HGB URINE DIPSTICK: NEGATIVE
Ketones, ur: 5 mg/dL — AB
Leukocytes, UA: NEGATIVE
NITRITE: NEGATIVE
Protein, ur: NEGATIVE mg/dL
SPECIFIC GRAVITY, URINE: 1.004 — AB (ref 1.005–1.030)
pH: 6 (ref 5.0–8.0)

## 2016-10-13 LAB — LIPASE, BLOOD: LIPASE: 22 U/L (ref 11–51)

## 2016-10-13 MED ORDER — SODIUM CHLORIDE 0.9 % IV BOLUS (SEPSIS)
1000.0000 mL | Freq: Once | INTRAVENOUS | Status: AC
  Administered 2016-10-13: 1000 mL via INTRAVENOUS

## 2016-10-13 MED ORDER — IOPAMIDOL (ISOVUE-300) INJECTION 61%
100.0000 mL | Freq: Once | INTRAVENOUS | Status: AC | PRN
  Administered 2016-10-13: 100 mL via INTRAVENOUS

## 2016-10-13 MED ORDER — ONDANSETRON HCL 4 MG/2ML IJ SOLN
4.0000 mg | Freq: Once | INTRAMUSCULAR | Status: AC
  Administered 2016-10-13: 4 mg via INTRAVENOUS
  Filled 2016-10-13: qty 2

## 2016-10-13 MED ORDER — ONDANSETRON 4 MG PO TBDP: 4.0000 mg | ORAL_TABLET | Freq: Three times a day (TID) | ORAL | 0 refills | Status: DC | PRN

## 2016-10-13 MED ORDER — IOPAMIDOL (ISOVUE-300) INJECTION 61%
INTRAVENOUS | Status: AC
  Filled 2016-10-13: qty 100

## 2016-10-13 MED ORDER — GABAPENTIN 300 MG PO CAPS
300.0000 mg | ORAL_CAPSULE | Freq: Once | ORAL | Status: AC
  Administered 2016-10-13: 300 mg via ORAL
  Filled 2016-10-13: qty 1

## 2016-10-13 MED ORDER — DOXYCYCLINE HYCLATE 100 MG PO CAPS: 100.0000 mg | ORAL_CAPSULE | Freq: Two times a day (BID) | ORAL | 0 refills | Status: AC

## 2016-10-13 MED ORDER — GABAPENTIN 100 MG PO CAPS: 300.0000 mg | ORAL_CAPSULE | Freq: Two times a day (BID) | ORAL | 0 refills | Status: DC

## 2016-10-13 MED ORDER — ACETAMINOPHEN 325 MG PO TABS
650.0000 mg | ORAL_TABLET | Freq: Once | ORAL | Status: AC
  Administered 2016-10-13: 650 mg via ORAL
  Filled 2016-10-13: qty 2

## 2016-10-13 NOTE — Discharge Instructions (Signed)
There were no signs of diverticulitis on the CT scan today. Your lab results today were also reassuring. You do have evidence of the shingles infection. It is thought that you may have a skin infection at the same time.  Please stop taking the Cipro and Flagyl. We will be starting a different antibiotic, Doxycycline, for the possible skin infection.  Continue taking the valacyclovir. May use Tylenol or the Neurontin for pain. Zofran as needed for nausea.  Follow-up with your PCP for recheck in about 3 days. Return to the ED should symptoms worsen.

## 2016-10-13 NOTE — ED Notes (Signed)
Provided patient apple juice and graham crackers.

## 2016-10-13 NOTE — ED Provider Notes (Signed)
[facility] DEPT Provider Note   CSN: [id number] Arrival date & time: 10/13/16  1210     History   Chief Complaint Chief Complaint  Patient presents with  . Emesis  . Diarrhea    HPI Brooke Jimenez is a 71 y.o. female.  HPI   Brooke Jimenez is a 71 y.o. female, with a history of anxiety, GERD, and mitral valve prolapse, presenting to the ED with nausea, diarrhea, and abdominal pain beginning August 15. Was seen by her PCP on the same day and diagnosed with mild diverticulosis versus mild diverticulitis via CT scan. Patient was prescribed Cipro, Flagyl, and phenergan and states she has been compliant. Diarrhea began after starting Cipro and Flagyl. She continues to have left lower quadrant abdominal pain and diarrhea that she feels may be worsening. She endorses between 4 and 7 soft stools a day. Also endorses vomiting and fever. She states she has been on largely a clear liquid diet, but did drink some milk and eat some eggs over the last couple days.   Patient also endorses a diagnosis of shingles that was made on Saturday, August 18. This rash covers the left lower quadrant of the abdomen, left flank, and left lower back. She states she feels as though it has been worsening. She has been taking Valacyclovir.   Her left lower quadrant and left flank pain is 7/10, described as "just uncomfortable," radiates around to the left lower back at times.   Denies hematochezia/melena, shortness of breath, chest pain, vision changes, headache, neuro deficits, dizziness, or any other complaints.  Has previously seen Dr. Silverio Decamp for her GERD.    Past Medical History:  Diagnosis Date  . Acne rosacea   . Anxiety   . Arthritis    arthritis in hip   . Bronchiectasis ([place])   . Cataract    beginning  . Complication of anesthesia    "really sore throat"  . Corneal dystrophy   . DDD (degenerative disc disease), cervical    and lower back  . Diverticulosis   . Dysphagia    chronic-  please see ultrasound done 4/16 15 in [facility]   . Dysrhythmia    sometimes patient has extra beats per patient   . Esophageal dysmotility   . Family history of adverse reaction to anesthesia    mother slow to wake up   . GERD (gastroesophageal reflux disease)    under control  . Glaucoma    "suspect"  . Gout    one finger x1 attack  . H/O hiatal hernia   . 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 wears hearing aides  . Heart murmur    no problem   . History of chicken pox   . History of melanoma    melanoma- 1986 and 1997   . Hypothyroidism   . Low back pain with sciatica   . Measles    hx of  . Mitral valve prolapse   . Nocturia   . Osteoporosis   . Peripheral neuropathy    feet  . PONV (postoperative nausea and vomiting)   . PTSD (post-traumatic stress disorder) 2006  . RSD (reflex sympathetic dystrophy) 1993  . Spider veins   . Urinary incontinence    occasional  . Varicose veins     Patient Active Problem List   Diagnosis Date Noted  . Exposure to influenza 04/01/2016  . Dysphagia   . Bronchiectasis without acute exacerbation ([place]) 08/30/2015  . Bronchiectasis ([place])  08/14/2015  . Chronic rhinitis 08/14/2015  . Avascular necrosis of femur head, left ([place]) 04/10/2014  . OA (osteoarthritis) of hip 04/10/2014  . Painful orthopaedic hardware [facility]) 10/17/2013    Past Surgical History:  Procedure Laterality Date  . ESOPHAGEAL MANOMETRY N/A 12/17/2015   Procedure: ESOPHAGEAL MANOMETRY (EM);  Surgeon: Mauri Pole, MD;  [facility];  Service: Endoscopy;  Laterality: N/A;  . HARDWARE REMOVAL Left 10/17/2013   Procedure: HARDWARE REMOVAL LEFT HIP;  Surgeon: Gearlean Alf, MD;  Location: [facility];  Service: Orthopedics;  Laterality: Left;  . HIP FRACTURE SURGERY Left 2006   3 screws  . INSERTION OF MESH  02/08/2014   Procedure: INSERTION OF MESH;  Surgeon: Jackolyn Confer, MD;  Location: [facility];  Service: General;;  . melanoma surgery   1986, 1997    . PARATHYROIDECTOMY  04/02/15  . [place] IMPEDANCE STUDY N/A 12/17/2015   Procedure: [place] IMPEDANCE STUDY;  Surgeon: Mauri Pole, MD;  [facility];  Service: Endoscopy;  Laterality: N/A;  . TOTAL HIP ARTHROPLASTY Left 04/10/2014   Procedure: LEFT TOTAL HIP ARTHROPLASTY ANTERIOR APPROACH;  Surgeon: Gearlean Alf, MD;  [facility];  Service: Orthopedics;  Laterality: Left;  . UMBILICAL HERNIA REPAIR N/A 02/08/2014   Procedure: HERNIA REPAIR UMBILICAL ADULT WITH MESH;  Surgeon: Jackolyn Confer, MD;  [facility];  Service: General;  Laterality: N/A;    OB History    No data available       Home Medications    Prior to Admission medications   Medication Sig Start Date End Date Taking? Authorizing Provider  acetaminophen (TYLENOL) 500 MG tablet Take 500 mg by mouth every 6 (six) hours as needed for mild pain.   Yes [provider]  levothyroxine (SYNTHROID, LEVOTHROID) 75 MCG tablet Take 75 mcg by mouth daily before breakfast.   Yes [provider]  Multiple Vitamin (MULTIVITAMIN WITH MINERALS) TABS tablet Take 1 tablet by mouth daily.   Yes [provider]  valACYclovir (VALTREX) 1000 MG tablet Take 1,000 mg by mouth 3 (three) times daily. 10/11/16  Yes [provider]  calcium carbonate (TUMS - DOSED IN MG ELEMENTAL CALCIUM) 500 MG chewable tablet Chew 3 tablets by mouth daily as needed for indigestion or heartburn.     [provider]  clindamycin (CLEOCIN) 150 MG capsule Take 600 mg by mouth. One hour before dental appointments    [provider]  doxycycline (VIBRAMYCIN) 100 MG capsule Take 1 capsule (100 mg total) by mouth 2 (two) times daily. 10/13/16 10/18/16  Persis Graffius, Raquel Sarna C, PA-C  gabapentin (NEURONTIN) 100 MG capsule Take 3 capsules (300 mg total) by mouth 2 (two) times daily. 10/13/16 10/18/16  Arielys Wandersee, Raquel Sarna C, PA-C  ibuprofen (ADVIL,MOTRIN) 200 MG tablet Take 200 mg by mouth 2 (two) times daily as needed for headache  or moderate pain.    [provider]  ondansetron (ZOFRAN ODT) 4 MG disintegrating tablet Take 1 tablet (4 mg total) by mouth every 8 (eight) hours as needed for nausea or vomiting. 10/13/16   Gaetano Romberger C, PA-C  promethazine (PHENERGAN) 25 MG tablet take 1 tablet by mouth every 6 hours if needed for nausea 10/08/16   [provider]    Family History Family History  Problem Relation Age of Onset  . Cancer Mother   . Hypothyroidism Mother   . Congestive Heart Failure Mother   . Hypertension Mother   . Heart failure Father  Social History Social History  Substance Use Topics  . Smoking status: Never Smoker  . Smokeless tobacco: Never Used  . Alcohol use No     Allergies   Codeine; Epinephrine; Erythromycin; Guaifenesin; Levaquin [levofloxacin in d5w]; Minocycline; Neosporin  [neomycin-bacitracin zn-polymyx]; Adhesive [tape]; Nickel; Nsaids; Penicillins; and Sulfa antibiotics   Review of Systems Review of Systems  Constitutional: Positive for fever.  Gastrointestinal: Positive for abdominal pain, diarrhea, nausea and vomiting. Negative for blood in stool.  Genitourinary: Negative for difficulty urinating and dysuria.  Skin: Positive for rash.  Neurological: Negative for dizziness and light-headedness.  All other systems reviewed and are negative.    Physical Exam Updated Vital Signs BP (!) 168/78 (BP Location: Right Arm)   Pulse 94   Temp 98.8 F (37.1 C) (Oral)   Resp 16   SpO2 100%   Physical Exam  Constitutional: She appears well-developed and well-nourished. No distress.  HENT:  Head: Normocephalic and atraumatic.  Eyes: Conjunctivae are normal.  Neck: Neck supple.  Cardiovascular: Normal rate, regular rhythm, normal heart sounds and intact distal pulses.   Pulmonary/Chest: Effort normal and breath sounds normal. No respiratory distress.  Abdominal: Soft. Bowel sounds are increased. There is tenderness in the left upper quadrant and left  lower quadrant. There is no guarding.  Musculoskeletal: She exhibits no edema.  Lymphadenopathy:    She has no cervical adenopathy.  Neurological: She is alert.  Skin: Skin is warm and dry. Capillary refill takes less than 2 seconds. Rash noted. Rash is vesicular. She is not diaphoretic.  Erythematous, vesicular rash to the left lower abdomen and suprapubic region, extending around the left flank and left lower back. Lesions also extend along the left lateral buttocks and left lateral upper thigh. Does not cross the midline. Dermatomal distribution.  No noted facial, scalp, or periorbital lesions. No lesions inside the ear canal.  Psychiatric: She has a normal mood and affect. Her behavior is normal.  Nursing note and vitals reviewed.        ED Treatments / Results  Labs (all labs ordered are listed, but only abnormal results are displayed) Labs Reviewed  COMPREHENSIVE METABOLIC PANEL - Abnormal; Notable for the following:       Result Value   Potassium 3.4 (*)    Glucose, Bld 107 (*)    All other components within normal limits  URINALYSIS, ROUTINE W REFLEX MICROSCOPIC - Abnormal; Notable for the following:    Specific Gravity, Urine 1.004 (*)    Ketones, ur 5 (*)    All other components within normal limits  LIPASE, BLOOD  CBC    EKG  EKG Interpretation None       Radiology Ct Abdomen Pelvis W Contrast  Result Date: 10/13/2016 CLINICAL DATA:  Abdominal pain, diverticulitis EXAM: CT ABDOMEN AND PELVIS WITH CONTRAST TECHNIQUE: Multidetector CT imaging of the abdomen and pelvis was performed using the standard protocol following bolus administration of intravenous contrast. CONTRAST:  170mL ISOVUE-300 IOPAMIDOL (ISOVUE-300) INJECTION 61% COMPARISON:  None. FINDINGS: Lower chest: Mild linear/ platelike atelectasis in the medial right lower lobe. Hepatobiliary: Liver is notable for a 2.1 cm hypoenhancing lesion in segment 4B (series 2/ image 24), favored to reflect a benign  hemangioma. Gallbladder is notable for possible layering gallbladder sludge, but no associated gallbladder wall thickening or inflammatory changes. Pancreas: Within normal limits. Spleen: Within normal limits. Adrenals/Urinary Tract: Adrenal glands are within normal limits. 3 mm nonobstructing right lower pole renal calculus (series 2/ image 36). Left kidney is  within normal limits. No hydronephrosis. Low-lying bladder. Stomach/Bowel: Stomach is notable for a tiny hiatal hernia. No evidence of bowel obstruction. Appendix is not discretely visualized. No colonic wall thickening or inflammatory changes to suggest acute diverticulitis. Vascular/Lymphatic: No evidence of abdominal aortic aneurysm. Atherosclerotic calcifications of the abdominal aorta and branch vessels. No suspicious abdominopelvic lymphadenopathy. Reproductive: Uterus is within normal limits. Bilateral ovaries are within normal limits. Other: No abdominopelvic ascites. Musculoskeletal: S-shaped thoracolumbar scoliosis with mild degenerative changes. IMPRESSION: No findings to suggest acute diverticulitis. No evidence of bowel obstruction. 3 mm nonobstructing right lower pole renal calculus. No hydronephrosis. Low-lying bladder/cystocele at rest. Additional ancillary findings as above. Electronically Signed   By: Julian Hy M.D.   On: 10/13/2016 15:35    Procedures Procedures (including critical care time)  Medications Ordered in ED Medications  sodium chloride 0.9 % bolus 1,000 mL (0 mLs Intravenous Stopped 10/13/16 1550)  ondansetron (ZOFRAN) injection 4 mg (4 mg Intravenous Given 10/13/16 1459)  iopamidol (ISOVUE-300) 61 % injection 100 mL (100 mLs Intravenous Contrast Given 10/13/16 1513)  gabapentin (NEURONTIN) capsule 300 mg (300 mg Oral Given 10/13/16 1726)  acetaminophen (TYLENOL) tablet 650 mg (650 mg Oral Given 10/13/16 1725)     Initial Impression / Assessment and Plan / ED Course  I have reviewed the triage vital signs and  the nursing notes.  Pertinent labs & imaging results that were available during my care of the patient were reviewed by me and considered in my medical decision making (see chart for details).  Clinical Course as of Oct 13 2325  [facility] Oct 13, 2016  1450 Patient endorses no change in her symptoms.   [SJ]  6712 Patient requests temperature measurement. Found to be 101.76F.  [SJ]    Clinical Course User Index [SJ] Arvon Schreiner C, PA-C    Patient presents with complaint of continued abdominal pain, diarrhea, and worsening rash. Suspect it is possible patient was experiencing shingles pain prior to her break out and that the Cipro and Flagyl may have been causing her other symptoms, such as diarrhea. Patient does present with a fever, which may stem from a possible cellulitis surrounding her shingles outbreak. Close PCP followup. The patient was given instructions for home care as well as return precautions. Patient voices understanding of these instructions, accepts the plan, and is comfortable with discharge.  Findings and plan of care discussed with Antony Blackbird, MD. Dr. Sherry Ruffing personally evaluated and examined this patient.   Final Clinical Impressions(s) / ED Diagnoses   Final diagnoses:  Herpes zoster without complication    New Prescriptions Discharge Medication List as of 10/13/2016  5:44 PM    START taking these medications   Details  doxycycline (VIBRAMYCIN) 100 MG capsule Take 1 capsule (100 mg total) by mouth 2 (two) times daily., Starting Mon 10/13/2016, Until Sat 10/18/2016, Print    gabapentin (NEURONTIN) 100 MG capsule Take 3 capsules (300 mg total) by mouth 2 (two) times daily., Starting Mon 10/13/2016, Until Sat 10/18/2016, Print    ondansetron (ZOFRAN ODT) 4 MG disintegrating tablet Take 1 tablet (4 mg total) by mouth every 8 (eight) hours as needed for nausea or vomiting., Starting Mon 10/13/2016, Print         Kimmora Risenhoover, [place] C, PA-C 10/13/16 2333    Tegeler, Gwenyth Allegra, MD 10/16/16 1023

## 2016-10-13 NOTE — ED Triage Notes (Signed)
Pt states she was dx with diverticulitis and shingles last week and has had N/V/D since but today it became worse. Taking cipro/flagyl at home. Alert and oriented.

## 2016-12-17 ENCOUNTER — Ambulatory Visit: Payer: Medicare Other | Attending: Physical Medicine and Rehabilitation | Admitting: Physical Therapy

## 2016-12-17 ENCOUNTER — Encounter: Payer: Self-pay | Admitting: Physical Therapy

## 2016-12-17 DIAGNOSIS — M545 Low back pain, unspecified: Secondary | ICD-10-CM

## 2016-12-17 DIAGNOSIS — M6281 Muscle weakness (generalized): Secondary | ICD-10-CM | POA: Insufficient documentation

## 2016-12-17 DIAGNOSIS — M542 Cervicalgia: Secondary | ICD-10-CM

## 2016-12-17 DIAGNOSIS — K224 Dyskinesia of esophagus: Secondary | ICD-10-CM | POA: Diagnosis present

## 2016-12-17 NOTE — Therapy (Signed)
[address] [place], [place], [address] Phone: [phone]   Fax:  [phone]  Physical Therapy Evaluation  Patient Details  Name: Brooke Jimenez MRN: [id number] Date of Birth: Mar 17, 1945 Referring Provider: Nelva Bush  Encounter Date: 12/17/2016      PT End of Session - 12/17/16 1435    Visit Number 1   Date for PT Re-Evaluation 02/16/17   PT Start Time 1400   PT Stop Time 1455   PT Time Calculation (min) 55 min   Activity Tolerance Patient tolerated treatment well   Behavior During Therapy [facility] for tasks assessed/performed      Past Medical History:  Diagnosis Date  . Acne rosacea   . Anxiety   . Arthritis    arthritis in hip   . Bronchiectasis ([place])   . Cataract    beginning  . Complication of anesthesia    "really sore throat"  . Corneal dystrophy   . DDD (degenerative disc disease), cervical    and lower back  . Diverticulosis   . Dysphagia    chronic- please see ultrasound done 4/16 15 in [facility]   . Dysrhythmia    sometimes patient has extra beats per patient   . Esophageal dysmotility   . Family history of adverse reaction to anesthesia    mother slow to wake up   . GERD (gastroesophageal reflux disease)    under control  . Glaucoma    "suspect"  . Gout    one finger x1 attack  . H/O hiatal hernia   . 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 wears hearing aides  . Heart murmur    no problem   . History of chicken pox   . History of melanoma    melanoma- 1986 and 1997   . Hypothyroidism   . Low back pain with sciatica   . Measles    hx of  . Mitral valve prolapse   . Nocturia   . Osteoporosis   . Peripheral neuropathy    feet  . PONV (postoperative nausea and vomiting)   . PTSD (post-traumatic stress disorder) 2006  . RSD (reflex sympathetic dystrophy) 1993  . Spider veins   . Urinary incontinence    occasional  . Varicose veins     Past Surgical History:   Procedure Laterality Date  . ESOPHAGEAL MANOMETRY N/A 12/17/2015   Procedure: ESOPHAGEAL MANOMETRY (EM);  Surgeon: Mauri Pole, MD;  [facility];  Service: Endoscopy;  Laterality: N/A;  . HARDWARE REMOVAL Left 10/17/2013   Procedure: HARDWARE REMOVAL LEFT HIP;  Surgeon: Gearlean Alf, MD;  [facility];  Service: Orthopedics;  Laterality: Left;  . HIP FRACTURE SURGERY Left 2006   3 screws  . INSERTION OF MESH  02/08/2014   Procedure: INSERTION OF MESH;  Surgeon: Jackolyn Confer, MD;  [facility];  Service: General;;  . melanoma surgery   1986, 1997  . PARATHYROIDECTOMY  04/02/15  . [place] IMPEDANCE STUDY N/A 12/17/2015   Procedure: [place] IMPEDANCE STUDY;  Surgeon: Mauri Pole, MD;  [facility];  Service: Endoscopy;  Laterality: N/A;  . TOTAL HIP ARTHROPLASTY Left 04/10/2014   Procedure: LEFT TOTAL HIP ARTHROPLASTY ANTERIOR APPROACH;  Surgeon: Gearlean Alf, MD;  [facility];  Service: Orthopedics;  Laterality: Left;  . UMBILICAL HERNIA REPAIR N/A 02/08/2014   Procedure: HERNIA REPAIR UMBILICAL ADULT WITH MESH;  Surgeon: Jackolyn Confer, MD;  [facility];  Service: General;  Laterality: N/A;    There were no vitals filed for this visit.       Subjective Assessment - 12/17/16 1406    Subjective Patient reports neck and back pain for most of the year, she is unsure of a cause.  X-rays show DDD of the cervical and lumbar area   Limitations Reading;Lifting;Walking;House hold activities   Patient Stated Goals have less pain   Currently in Pain? Yes   Pain Score 7    Pain Location Back  neck   Pain Orientation Lower   Pain Descriptors / Indicators Aching   Pain Type Acute pain   Pain Onset More than a month ago   Pain Frequency Constant   Aggravating Factors  activity will increase the pain to a 10/10, sitting long periods   Pain Relieving Factors reports that she takes a lot of OTC pain meds, she does report having a bad case of the  shingles this year   Effect of Pain on Daily Activities "limits all ADL's"            [facility] PT Assessment - 12/17/16 0001      Assessment   Medical Diagnosis low back pain, cervicalgia   Referring Provider Ramos   Onset Date/Surgical Date 11/17/16   Prior Therapy no     Precautions   Precaution Comments left THR     Balance Screen   Has the patient fallen in the past 6 months No   Has the patient had a decrease in activity level because of a fear of falling?  No   Is the patient reluctant to leave their home because of a fear of falling?  No     Home Environment   Additional Comments does her own housework, has stairs     Prior Function   Level of Independence Independent   Vocation Retired   Leisure no exercise and reports that she feels very weak     Posture/Postural Control   Posture Comments fwd head, rounded shoulders     ROM / Strength   AROM / PROM / Strength AROM;Strength     AROM   Overall AROM Comments Cervical ROM flexion and rotation decreased 25%, extension and side bending decreased 75% with tightness, Lumbar ROM decreased 50% with c/o tightness     Strength   Overall Strength Comments shoulders 4/5, LE's 4-/5 with ache after the tests     Palpation   Palpation comment she is very tight in the parapsinals cervical - lumbar, some tenderness into the SI areas     Ambulation/Gait   Gait Comments mild unsteady gait, she walked 220 feet and her O2 dropped from 98% to 93%            Objective measurements completed on examination: See above findings.          [facility] Adult PT Treatment/Exercise - 12/17/16 0001      Modalities   Modalities Moist Heat;Electrical Stimulation     Moist Heat Therapy   Number Minutes Moist Heat 15 Minutes   Moist Heat Location Lumbar Spine;Cervical     Electrical Stimulation   Electrical Stimulation Location L/S area   Electrical Stimulation Action IFC   Electrical Stimulation Parameters supine   Electrical  Stimulation Goals Pain                PT Education - 12/17/16 1435    Education provided Yes   Education Details Wms flexion  Person(s) Educated Patient   Methods Explanation;Demonstration;Handout   Comprehension Verbalized understanding          PT Short Term Goals - 12/17/16 1523      PT SHORT TERM GOAL #1   Title independent with HEP   Time 2   Period Weeks   Status New           PT Long Term Goals - 12/17/16 1523      PT LONG TERM GOAL #1   Title go up and down stairs step over step   Time 8   Period Weeks   Status New     PT LONG TERM GOAL #2   Title decrease pain 50%   Time 8   Period Weeks   Status New     PT LONG TERM GOAL #3   Title walk 100 feet without difficulty   Time 8   Period Weeks   Status New     PT LONG TERM GOAL #4   Title increase ROM of the cervical and lumbar spine 25%   Time 8   Period Weeks   Status New                Plan - 12/17/16 1438    Clinical Impression Statement Patient reports some back and neck pain over the past year, she has had a bout of shingles.  She reports some difficulty walking and doing her housework.  Her cervical and lumbar ROM was decreased with c/o tightness, has spasms in the neck and lumbar areas.  Has a weak core.  Needs some strength overall.   History and Personal Factors relevant to plan of care: HOH, THR, COPD   Clinical Presentation Stable   Clinical Decision Making Low   Rehab Potential Good   PT Frequency 3x / week   PT Duration 8 weeks   PT Treatment/Interventions ADLs/Self Care Home Management;Cryotherapy;Electrical Stimulation;Moist Heat;Ultrasound;Traction;Therapeutic activities;Therapeutic exercise;Gait training;Balance training;Patient/family education;Manual techniques   PT Next Visit Plan start gym activities, work on overall and core strength   Consulted and Agree with Plan of Care Patient      Patient will benefit from skilled therapeutic intervention in order  to improve the following deficits and impairments:  Cardiopulmonary status limiting activity, Decreased activity tolerance, Decreased balance, Decreased mobility, Decreased strength, Improper body mechanics, Impaired flexibility, Pain, Increased muscle spasms, Difficulty walking, Decreased range of motion  Visit Diagnosis: Acute bilateral low back pain without sciatica - Plan: PT plan of care cert/re-cert  Cervicalgia - Plan: PT plan of care cert/re-cert  Muscle weakness (generalized) - Plan: PT plan of care cert/re-cert      G-Codes - 76/19/50 1535    Functional Assessment Tool Used (Outpatient Only) FOTO 59% limitation   Functional Limitation Mobility: Walking and moving around   Mobility: Walking and Moving Around Current Status ([id number]) At least 40 percent but less than 60 percent impaired, limited or restricted   Mobility: Walking and Moving Around Goal Status [phone]) At least 20 percent but less than 40 percent impaired, limited or restricted       Problem List Patient Active Problem List   Diagnosis Date Noted  . Exposure to influenza 04/01/2016  . Dysphagia   . Bronchiectasis without acute exacerbation ([place]) 08/30/2015  . Bronchiectasis ([place]) 08/14/2015  . Chronic rhinitis 08/14/2015  . Avascular necrosis of femur head, left ([place]) 04/10/2014  . OA (osteoarthritis) of hip 04/10/2014  . Painful orthopaedic hardware ([place]) 10/17/2013    Sumner Boast., PT  12/17/2016, 3:39 PM  [address] [place], [place], [address] Phone: [phone]   Fax:  [phone]  Name: Brooke Jimenez MRN: [id number] Date of Birth: [birth year]

## 2016-12-18 ENCOUNTER — Encounter: Payer: Self-pay | Admitting: Physical Therapy

## 2016-12-18 ENCOUNTER — Ambulatory Visit: Payer: Medicare Other | Admitting: Physical Therapy

## 2016-12-18 DIAGNOSIS — M545 Low back pain, unspecified: Secondary | ICD-10-CM

## 2016-12-18 DIAGNOSIS — M6281 Muscle weakness (generalized): Secondary | ICD-10-CM

## 2016-12-18 NOTE — Therapy (Signed)
[address] [place], [place], [address] Phone: [phone]   Fax:  [phone]  Physical Therapy Treatment  Patient Details  Name: Brooke Jimenez MRN: [id number] Date of Birth: 1945/06/15 Referring Provider: Nelva Bush  Encounter Date: 12/18/2016      PT End of Session - 12/18/16 1605    Visit Number 2   Date for PT Re-Evaluation 02/16/17   PT Start Time 3149   PT Stop Time 1630   PT Time Calculation (min) 52 min      Past Medical History:  Diagnosis Date  . Acne rosacea   . Anxiety   . Arthritis    arthritis in hip   . Bronchiectasis ([place])   . Cataract    beginning  . Complication of anesthesia    "really sore throat"  . Corneal dystrophy   . DDD (degenerative disc disease), cervical    and lower back  . Diverticulosis   . Dysphagia    chronic- please see ultrasound done 4/16 15 in [facility]   . Dysrhythmia    sometimes patient has extra beats per patient   . Esophageal dysmotility   . Family history of adverse reaction to anesthesia    mother slow to wake up   . GERD (gastroesophageal reflux disease)    under control  . Glaucoma    "suspect"  . Gout    one finger x1 attack  . H/O hiatal hernia   . 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 wears hearing aides  . Heart murmur    no problem   . History of chicken pox   . History of melanoma    melanoma- 1986 and 1997   . Hypothyroidism   . Low back pain with sciatica   . Measles    hx of  . Mitral valve prolapse   . Nocturia   . Osteoporosis   . Peripheral neuropathy    feet  . PONV (postoperative nausea and vomiting)   . PTSD (post-traumatic stress disorder) 2006  . RSD (reflex sympathetic dystrophy) 1993  . Spider veins   . Urinary incontinence    occasional  . Varicose veins     Past Surgical History:  Procedure Laterality Date  . ESOPHAGEAL MANOMETRY N/A 12/17/2015   Procedure: ESOPHAGEAL MANOMETRY (EM);  Surgeon:  Mauri Pole, MD;  [facility];  Service: Endoscopy;  Laterality: N/A;  . HARDWARE REMOVAL Left 10/17/2013   Procedure: HARDWARE REMOVAL LEFT HIP;  Surgeon: Gearlean Alf, MD;  [facility];  Service: Orthopedics;  Laterality: Left;  . HIP FRACTURE SURGERY Left 2006   3 screws  . INSERTION OF MESH  02/08/2014   Procedure: INSERTION OF MESH;  Surgeon: Jackolyn Confer, MD;  [facility];  Service: General;;  . melanoma surgery   1986, 1997  . PARATHYROIDECTOMY  04/02/15  . [place] IMPEDANCE STUDY N/A 12/17/2015   Procedure: [place] IMPEDANCE STUDY;  Surgeon: Mauri Pole, MD;  [facility];  Service: Endoscopy;  Laterality: N/A;  . TOTAL HIP ARTHROPLASTY Left 04/10/2014   Procedure: LEFT TOTAL HIP ARTHROPLASTY ANTERIOR APPROACH;  Surgeon: Gearlean Alf, MD;  [facility];  Service: Orthopedics;  Laterality: Left;  . UMBILICAL HERNIA REPAIR N/A 02/08/2014   Procedure: HERNIA REPAIR UMBILICAL ADULT WITH MESH;  Surgeon: Jackolyn Confer, MD;  [facility];  Service: General;  Laterality: N/A;    There were no vitals filed for  this visit.      Subjective Assessment - 12/18/16 1539    Subjective did ex 1 time without issue   Currently in Pain? Yes   Pain Score 5    Pain Location Back                         [facility] Adult PT Treatment/Exercise - 12/18/16 0001      Exercises   Exercises Lumbar;Knee/Hip     Lumbar Exercises: Aerobic   Stationary Bike Nustep L 3 6 min     Lumbar Exercises: Standing   Other Standing Lumbar Exercises red tband scap stab 15 times- 3 ways    Other Standing Lumbar Exercises red tband hip ext and abd 15 each     Lumbar Exercises: Supine   Ab Set 15 reps   Clam 15 reps   Clam Limitations red tband   Other Supine Lumbar Exercises ball squeeze 15   Other Supine Lumbar Exercises bridge with ball, KTC and obl     Modalities   Modalities Moist Heat;Electrical Stimulation     Moist Heat Therapy   Number  Minutes Moist Heat 15 Minutes   Moist Heat Location Lumbar Spine;Cervical     Electrical Stimulation   Electrical Stimulation Location L/S area   Electrical Stimulation Action IFC   Electrical Stimulation Parameters supine   Electrical Stimulation Goals Pain                PT Education - January 03, 2017 1435    Education provided Yes   Education Details Wms flexion   Person(s) Educated Patient   Methods Explanation;Demonstration;Handout   Comprehension Verbalized understanding          PT Short Term Goals - 01/03/17 1523      PT SHORT TERM GOAL #1   Title independent with HEP   Time 2   Period Weeks   Status New           PT Long Term Goals - 01-03-17 1523      PT LONG TERM GOAL #1   Title go up and down stairs step over step   Time 8   Period Weeks   Status New     PT LONG TERM GOAL #2   Title decrease pain 50%   Time 8   Period Weeks   Status New     PT LONG TERM GOAL #3   Title walk 100 feet without difficulty   Time 8   Period Weeks   Status New     PT LONG TERM GOAL #4   Title increase ROM of the cervical and lumbar spine 25%   Time 8   Period Weeks   Status New               Plan - 12/18/16 1605    Clinical Impression Statement pt tolerated all interventions well, c/o fatigue but no pain. cuing needed for posture and control of ex   PT Next Visit Plan  work on overall and core strength      Patient will benefit from skilled therapeutic intervention in order to improve the following deficits and impairments:  Cardiopulmonary status limiting activity, Decreased activity tolerance, Decreased balance, Decreased mobility, Decreased strength, Improper body mechanics, Impaired flexibility, Pain, Increased muscle spasms, Difficulty walking, Decreased range of motion  Visit Diagnosis: Acute bilateral low back pain without sciatica  Muscle weakness (generalized)       G-Codes - January 03, 2017 1535  Functional Assessment Tool Used  (Outpatient Only) FOTO 59% limitation   Functional Limitation Mobility: Walking and moving around   Mobility: Walking and Moving Around Current Status [phone]) At least 40 percent but less than 60 percent impaired, limited or restricted   Mobility: Walking and Moving Around Goal Status [phone]) At least 20 percent but less than 40 percent impaired, limited or restricted      Problem List Patient Active Problem List   Diagnosis Date Noted  . Exposure to influenza 04/01/2016  . Dysphagia   . Bronchiectasis without acute exacerbation ([place]) 08/30/2015  . Bronchiectasis ([place]) 08/14/2015  . Chronic rhinitis 08/14/2015  . Avascular necrosis of femur head, left ([place]) 04/10/2014  . OA (osteoarthritis) of hip 04/10/2014  . Painful orthopaedic hardware [facility]) 10/17/2013    Sekou Zuckerman,ANGIE PTA 12/18/2016, 4:07 PM  [place] [address] [place], [place], [address] Phone: [phone]   Fax:  [phone]  Name: Brooke Jimenez MRN: [id number] Date of Birth: Nov 13, 1945

## 2016-12-22 ENCOUNTER — Ambulatory Visit: Payer: Medicare Other | Admitting: Physical Therapy

## 2016-12-22 ENCOUNTER — Encounter: Payer: Self-pay | Admitting: Physical Therapy

## 2016-12-22 DIAGNOSIS — M6281 Muscle weakness (generalized): Secondary | ICD-10-CM

## 2016-12-22 DIAGNOSIS — M542 Cervicalgia: Secondary | ICD-10-CM

## 2016-12-22 DIAGNOSIS — M545 Low back pain, unspecified: Secondary | ICD-10-CM

## 2016-12-22 NOTE — Therapy (Signed)
[place], [place], [address] Phone: [phone]   Fax:  [phone]  Physical Therapy Treatment  Patient Details  Name: Phillipa Morden MRN: [id number] Date of Birth: [birth year] Referring Provider: Nelva Bush  Encounter Date: 12/22/2016      PT End of Session - 12/22/16 7026    Visit Number 3   Date for PT Re-Evaluation 02/16/17   PT Start Time 1430   PT Stop Time 1531   PT Time Calculation (min) 61 min   Activity Tolerance Patient tolerated treatment well   Behavior During Therapy [facility] for tasks assessed/performed      Past Medical History:  Diagnosis Date  . Acne rosacea   . Anxiety   . Arthritis    arthritis in hip   . Bronchiectasis ([place])   . Cataract    beginning  . Complication of anesthesia    "really sore throat"  . Corneal dystrophy   . DDD (degenerative disc disease), cervical    and lower back  . Diverticulosis   . Dysphagia    chronic- please see ultrasound done 4/16 15 in [facility]   . Dysrhythmia    sometimes patient has extra beats per patient   . Esophageal dysmotility   . Family history of adverse reaction to anesthesia    mother slow to wake up   . GERD (gastroesophageal reflux disease)    under control  . Glaucoma    "suspect"  . Gout    one finger x1 attack  . H/O hiatal hernia   . 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 wears hearing aides  . Heart murmur    no problem   . History of chicken pox   . History of melanoma    melanoma- 1986 and 1997   . Hypothyroidism   . Low back pain with sciatica   . Measles    hx of  . Mitral valve prolapse   . Nocturia   . Osteoporosis   . Peripheral neuropathy    feet  . PONV (postoperative nausea and vomiting)   . PTSD (post-traumatic stress disorder) 2006  . RSD (reflex sympathetic dystrophy) 1993  . Spider veins   . Urinary incontinence    occasional  . Varicose veins     Past Surgical History:   Procedure Laterality Date  . ESOPHAGEAL MANOMETRY N/A 12/17/2015   Procedure: ESOPHAGEAL MANOMETRY (EM);  Surgeon: Mauri Pole, MD;  [facility];  Service: Endoscopy;  Laterality: N/A;  . HARDWARE REMOVAL Left 10/17/2013   Procedure: HARDWARE REMOVAL LEFT HIP;  Surgeon: Gearlean Alf, MD;  [facility];  Service: Orthopedics;  Laterality: Left;  . HIP FRACTURE SURGERY Left 2006   3 screws  . INSERTION OF MESH  02/08/2014   Procedure: INSERTION OF MESH;  Surgeon: Jackolyn Confer, MD;  [facility];  Service: General;;  . melanoma surgery   1986, 1997  . PARATHYROIDECTOMY  04/02/15  . [place] IMPEDANCE STUDY N/A 12/17/2015   Procedure: [place] IMPEDANCE STUDY;  Surgeon: Mauri Pole, MD;  [facility];  Service: Endoscopy;  Laterality: N/A;  . TOTAL HIP ARTHROPLASTY Left 04/10/2014   Procedure: LEFT TOTAL HIP ARTHROPLASTY ANTERIOR APPROACH;  Surgeon: Gearlean Alf, MD;  [facility];  Service: Orthopedics;  Laterality: Left;  . UMBILICAL HERNIA REPAIR N/A 02/08/2014   Procedure: HERNIA REPAIR UMBILICAL ADULT WITH MESH;  Surgeon: Jackolyn Confer, MD;  [facility];  Service: General;  Laterality: N/A;    There were no vitals filed for this visit.      Subjective Assessment - 12/22/16 1431    Subjective No new issues    Currently in Pain? Yes   Pain Score 6    Pain Location Back                         [facility] Adult PT Treatment/Exercise - 12/22/16 0001      Exercises   Exercises Lumbar;Knee/Hip     Lumbar Exercises: Aerobic   Stationary Bike Nustep L 3 6 min     Lumbar Exercises: Standing   Row Theraband;10 reps;Both  x2   Theraband Level (Row) Level 2 (Red)   Shoulder Extension 10 reps;Theraband;Both  x2   Theraband Level (Shoulder Extension) Level 2 (Red)   Other Standing Lumbar Exercises Shoulder ER yellow tband ; Horiz abd yellow tband x15   Other Standing Lumbar Exercises red tband hip ext and abd 15 each      Lumbar Exercises: Supine   Ab Set 10 reps;3 seconds   Clam 15 reps   Clam Limitations red tband   Bridge Compliant;10 reps;2 seconds   Other Supine Lumbar Exercises ball squeeze 15   Other Supine Lumbar Exercises bridge with ball, KTC and obl     Modalities   Modalities Moist Heat;Electrical Stimulation     Moist Heat Therapy   Number Minutes Moist Heat 15 Minutes   Moist Heat Location Lumbar Spine;Cervical     Electrical Stimulation   Electrical Stimulation Location L/S area   Electrical Stimulation Action IFC   Electrical Stimulation Parameters supine    Electrical Stimulation Goals Pain                  PT Short Term Goals - 12/17/16 1523      PT SHORT TERM GOAL #1   Title independent with HEP   Time 2   Period Weeks   Status New           PT Long Term Goals - 12/17/16 1523      PT LONG TERM GOAL #1   Title go up and down stairs step over step   Time 8   Period Weeks   Status New     PT LONG TERM GOAL #2   Title decrease pain 50%   Time 8   Period Weeks   Status New     PT LONG TERM GOAL #3   Title walk 100 feet without difficulty   Time 8   Period Weeks   Status New     PT LONG TERM GOAL #4   Title increase ROM of the cervical and lumbar spine 25%   Time 8   Period Weeks   Status New               Plan - 12/22/16 1516    Clinical Impression Statement Cueing given for postural control. Pt not compliant with HEP because she lost her paper. Pt given copy of her HEP. No reports of increase pain, she does reports some hip fatigue with the anchoring LE with hip strengthening interventions.   Rehab Potential Good   PT Frequency 3x / week   PT Duration 8 weeks   PT Treatment/Interventions ADLs/Self Care Home Management;Cryotherapy;Electrical Stimulation;Moist Heat;Ultrasound;Traction;Therapeutic activities;Therapeutic exercise;Gait training;Balance training;Patient/family education;Manual techniques   PT Next Visit Plan  work on  overall and core  strength      Patient will benefit from skilled therapeutic intervention in order to improve the following deficits and impairments:  Cardiopulmonary status limiting activity, Decreased activity tolerance, Decreased balance, Decreased mobility, Decreased strength, Improper body mechanics, Impaired flexibility, Pain, Increased muscle spasms, Difficulty walking, Decreased range of motion  Visit Diagnosis: Acute bilateral low back pain without sciatica  Muscle weakness (generalized)  Cervicalgia     Problem List Patient Active Problem List   Diagnosis Date Noted  . Exposure to influenza 04/01/2016  . Dysphagia   . Bronchiectasis without acute exacerbation ([place]) 08/30/2015  . Bronchiectasis ([place]) 08/14/2015  . Chronic rhinitis 08/14/2015  . Avascular necrosis of femur head, left ([place]) 04/10/2014  . OA (osteoarthritis) of hip 04/10/2014  . Painful orthopaedic hardware [facility]) 10/17/2013    Scot Jun, PTA 12/22/2016, 3:18 PM  [place], [place], [address] Phone: [phone]   Fax:  [phone]  Name: Iceis Knab MRN: [id number] Date of Birth: [birth year]

## 2016-12-24 ENCOUNTER — Ambulatory Visit: Payer: Medicare Other | Admitting: Physical Therapy

## 2016-12-24 ENCOUNTER — Encounter: Payer: Self-pay | Admitting: Physical Therapy

## 2016-12-24 DIAGNOSIS — M545 Low back pain, unspecified: Secondary | ICD-10-CM

## 2016-12-24 DIAGNOSIS — K224 Dyskinesia of esophagus: Secondary | ICD-10-CM

## 2016-12-24 DIAGNOSIS — M6281 Muscle weakness (generalized): Secondary | ICD-10-CM

## 2016-12-24 DIAGNOSIS — M542 Cervicalgia: Secondary | ICD-10-CM

## 2016-12-24 NOTE — Therapy (Signed)
[place], [place], [address] Phone: [phone]   Fax:  [phone]  Physical Therapy Treatment  Patient Details  Name: Brooke Jimenez MRN: [id number] Date of Birth: 07-Oct-1945 Referring Provider: Nelva Bush  Encounter Date: 12/24/2016      PT End of Session - 12/24/16 1518    Visit Number 4   Date for PT Re-Evaluation 02/16/17   PT Start Time 3818   PT Stop Time 1534   PT Time Calculation (min) 55 min   Activity Tolerance Patient tolerated treatment well   Behavior During Therapy [facility] for tasks assessed/performed      Past Medical History:  Diagnosis Date  . Acne rosacea   . Anxiety   . Arthritis    arthritis in hip   . Bronchiectasis ([place])   . Cataract    beginning  . Complication of anesthesia    "really sore throat"  . Corneal dystrophy   . DDD (degenerative disc disease), cervical    and lower back  . Diverticulosis   . Dysphagia    chronic- please see ultrasound done 4/16 15 in [facility]   . Dysrhythmia    sometimes patient has extra beats per patient   . Esophageal dysmotility   . Family history of adverse reaction to anesthesia    mother slow to wake up   . GERD (gastroesophageal reflux disease)    under control  . Glaucoma    "suspect"  . Gout    one finger x1 attack  . H/O hiatal hernia   . 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 wears hearing aides  . Heart murmur    no problem   . History of chicken pox   . History of melanoma    melanoma- 1986 and 1997   . Hypothyroidism   . Low back pain with sciatica   . Measles    hx of  . Mitral valve prolapse   . Nocturia   . Osteoporosis   . Peripheral neuropathy    feet  . PONV (postoperative nausea and vomiting)   . PTSD (post-traumatic stress disorder) 2006  . RSD (reflex sympathetic dystrophy) 1993  . Spider veins   . Urinary incontinence    occasional  . Varicose veins     Past Surgical History:   Procedure Laterality Date  . ESOPHAGEAL MANOMETRY N/A 12/17/2015   Procedure: ESOPHAGEAL MANOMETRY (EM);  Surgeon: Mauri Pole, MD;  [facility];  Service: Endoscopy;  Laterality: N/A;  . HARDWARE REMOVAL Left 10/17/2013   Procedure: HARDWARE REMOVAL LEFT HIP;  Surgeon: Gearlean Alf, MD;  [facility];  Service: Orthopedics;  Laterality: Left;  . HIP FRACTURE SURGERY Left 2006   3 screws  . INSERTION OF MESH  02/08/2014   Procedure: INSERTION OF MESH;  Surgeon: Jackolyn Confer, MD;  [facility];  Service: General;;  . melanoma surgery   1986, 1997  . PARATHYROIDECTOMY  04/02/15  . [place] IMPEDANCE STUDY N/A 12/17/2015   Procedure: [place] IMPEDANCE STUDY;  Surgeon: Mauri Pole, MD;  [facility];  Service: Endoscopy;  Laterality: N/A;  . TOTAL HIP ARTHROPLASTY Left 04/10/2014   Procedure: LEFT TOTAL HIP ARTHROPLASTY ANTERIOR APPROACH;  Surgeon: Gearlean Alf, MD;  [facility];  Service: Orthopedics;  Laterality: Left;  . UMBILICAL HERNIA REPAIR N/A 02/08/2014   Procedure: HERNIA REPAIR UMBILICAL ADULT WITH MESH;  Surgeon: Jackolyn Confer, MD;  [facility];  Service: General;  Laterality: N/A;    There were no vitals filed for this visit.      Subjective Assessment - 12/24/16 1440    Subjective "I think I feel a little bit better"   Currently in Pain? Yes   Pain Score 6    Pain Location Back   Pain Orientation Lower                         [facility] Adult PT Treatment/Exercise - 12/24/16 0001      Lumbar Exercises: Aerobic   Stationary Bike Nustep L 3 6 min     Lumbar Exercises: Standing   Other Standing Lumbar Exercises Shoulder ER yellow tband ; Horiz abd yellow tband x15; $lb shruggs x15    Other Standing Lumbar Exercises red tband hip ext and abd 15 each     Lumbar Exercises: Supine   Clam 15 reps   Clam Limitations green tband    Bridge Compliant;2 seconds;15 reps  with ball squeeze   Other Supine Lumbar  Exercises ball squeeze andr   Other Supine Lumbar Exercises bridge with ball, KTC and obl     Modalities   Modalities Moist Heat;Electrical Stimulation     Moist Heat Therapy   Number Minutes Moist Heat 15 Minutes   Moist Heat Location Lumbar Spine;Cervical     Electrical Stimulation   Electrical Stimulation Location L/S area   Electrical Stimulation Action IFC   Electrical Stimulation Parameters supine   Electrical Stimulation Goals Pain                  PT Short Term Goals - 12/17/16 1523      PT SHORT TERM GOAL #1   Title independent with HEP   Time 2   Period Weeks   Status New           PT Long Term Goals - 12/17/16 1523      PT LONG TERM GOAL #1   Title go up and down stairs step over step   Time 8   Period Weeks   Status New     PT LONG TERM GOAL #2   Title decrease pain 50%   Time 8   Period Weeks   Status New     PT LONG TERM GOAL #3   Title walk 100 feet without difficulty   Time 8   Period Weeks   Status New     PT LONG TERM GOAL #4   Title increase ROM of the cervical and lumbar spine 25%   Time 8   Period Weeks   Status New               Plan - 12/24/16 1519    Clinical Impression Statement Pt ~ 10 minutes late with today's treatment. No issues reported during today's session. Reports that she has been compliant with HEP.   Rehab Potential Good   PT Frequency 3x / week   PT Duration 8 weeks   PT Treatment/Interventions ADLs/Self Care Home Management;Cryotherapy;Electrical Stimulation;Moist Heat;Ultrasound;Traction;Therapeutic activities;Therapeutic exercise;Gait training;Balance training;Patient/family education;Manual techniques   PT Next Visit Plan  work on overall and core strength      Patient will benefit from skilled therapeutic intervention in order to improve the following deficits and impairments:  Cardiopulmonary status limiting activity, Decreased activity tolerance, Decreased balance, Decreased mobility,  Decreased strength, Improper body mechanics, Impaired flexibility, Pain, Increased muscle spasms, Difficulty walking, Decreased range  of motion  Visit Diagnosis: Acute bilateral low back pain without sciatica  Muscle weakness (generalized)  Cervicalgia  Esophageal dysmotility     Problem List Patient Active Problem List   Diagnosis Date Noted  . Exposure to influenza 04/01/2016  . Dysphagia   . Bronchiectasis without acute exacerbation ([place]) 08/30/2015  . Bronchiectasis ([place]) 08/14/2015  . Chronic rhinitis 08/14/2015  . Avascular necrosis of femur head, left ([place]) 04/10/2014  . OA (osteoarthritis) of hip 04/10/2014  . Painful orthopaedic hardware [facility]) 10/17/2013    Scot Jun, PTA 12/24/2016, 3:21 PM  [place], [place], [address] Phone: [phone]   Fax:  [phone]  Name: Brooke Jimenez MRN: [id number] Date of Birth: 03-03-1945

## 2016-12-25 ENCOUNTER — Ambulatory Visit (INDEPENDENT_AMBULATORY_CARE_PROVIDER_SITE_OTHER): Payer: Medicare Other | Admitting: Neurology

## 2016-12-25 ENCOUNTER — Encounter: Payer: Self-pay | Admitting: Neurology

## 2016-12-25 VITALS — BP 134/80 | HR 76 | Resp 18 | Ht 63.5 in | Wt 171.5 lb

## 2016-12-25 DIAGNOSIS — B0229 Other postherpetic nervous system involvement: Secondary | ICD-10-CM

## 2016-12-25 DIAGNOSIS — G629 Polyneuropathy, unspecified: Secondary | ICD-10-CM

## 2016-12-25 DIAGNOSIS — R208 Other disturbances of skin sensation: Secondary | ICD-10-CM | POA: Diagnosis not present

## 2016-12-25 MED ORDER — LIDOCAINE 5 % EX OINT: TOPICAL_OINTMENT | CUTANEOUS | 5 refills | Status: DC

## 2016-12-25 NOTE — Patient Instructions (Signed)
I'd like to see if you can increase your gabapentin to 600-800 mg daily as follows:  1 in the morning, 1 around noon, 2-3 in the evening and 2-3 at night.   Apply about 1 inch of lidocaine ointment to the left flank twice a day

## 2016-12-25 NOTE — Progress Notes (Signed)
[facility]  PATIENT: Brooke Jimenez DOB: [birth year]  REFERRING DOCTOR OR PCP:  Adline Mango, MD SOURCE: Patient and notes from Dr. [name]  _________________________________   HISTORICAL  CHIEF COMPLAINT:  Chief Complaint  Patient presents with  . Postherpetic Neuralgia    Had shingles (back, left side), in August 2018.  C/O continued pain left flank region. She also c/o numbness in toes and feet, onset 2001./fim    HISTORY OF PRESENT ILLNESS:  I had the pleasure seeing you patient, Brooke Jimenez, [facility] for neurologic consultation regarding her post herpetic neuralgia pain and polyneuropathy.  Around 10/13/2016, she had the onset of a left flank rash going towards the groin that was associated with an intense burning pain.  The pain felt deeper inside of her abdomen and at first diverticulitis was considered (she has diverticulosis) but CT scan did not verify.    She does still have some deeper pain but this is better.    The shingles pain has improved but is still intense.  She started with gabapentin 200 mg po bid and then 200-100-200 and eventually up to 1500 mg daily. The high dose made her drowsy.     More recently she has backed off to just 200 mg at night.   She also took 4000 mg Tylenol daily but liver enzymes were elevated and she stopped.    She never used patches or ointment.    She was prescribed some compounded ointment but never took it.     She has had bilateral foot pain x years.    She has a long history of bunions and also has numbness and tingling bilaterally since 2001    Around 2005, she was diagnosed with polyneuropathy clinically, but never had a NCV/EMG.    Painful tingling is absent much of the day but is worse when she wears tight shoes.    She notes the sheets rubbing across her toes is sometimes uncomfortable.  In general, numbness is worse than pain.   She feels the painful tingling and numbness has slowly progressed  over the past few years.      She is otherwise fairly healthy except for bronchiectasis and history of left hip fracture with painful hardware removal a few years later.       REVIEW OF SYSTEMS: Constitutional: No fevers, chills, sweats, or change in appetite.   Insomnia Eyes: No visual changes, double vision, eye pain Ear, nose and throat: No hearing loss, ear pain, nasal congestion, sore throat Cardiovascular: No chest pain, palpitations Respiratory: No shortness of breath at rest or with exertion.   No wheezes GastrointestinaI: No nausea, vomiting, diarrhea, abdominal pain, fecal incontinence Genitourinary: No dysuria, urinary retention or frequency.  No nocturia. Musculoskeletal: No neck pain, back pain Integumentary: No rash, pruritus, skin lesions Neurological: as above Psychiatric: No depression at this time.  No anxiety Endocrine: No palpitations, diaphoresis, change in appetite, change in weigh or increased thirst Hematologic/Lymphatic: No anemia, purpura, petechiae. Allergic/Immunologic: No itchy/runny eyes, nasal congestion, recent allergic reactions, rashes  ALLERGIES: Allergies  Allergen Reactions  . Codeine Nausea And Vomiting    DILAUDID AND TRAMADOL ARE OKAY.   Marland Kitchen Epinephrine     Heart pounds very hard  . Erythromycin Other (See Comments)    Abdominal pain   . Guaifenesin Nausea Only  . Levaquin [Levofloxacin In D5w] Nausea And Vomiting  . Minocycline     Skin discoloration on ankles per pt  . Neosporin  [Neomycin-Bacitracin Zn-Polymyx]  Other reaction(s): Other (See Comments) Blisters, Erythema  . Adhesive [Tape] Rash  . Nickel Rash  . Nsaids     Stomach aches  . Penicillins Rash  . Sulfa Antibiotics Rash    HOME MEDICATIONS:  Current Outpatient Prescriptions:  .  calcium carbonate (TUMS - DOSED IN MG ELEMENTAL CALCIUM) 500 MG chewable tablet, Chew 3 tablets by mouth daily as needed for indigestion or heartburn. , Disp: , Rfl:  .  clindamycin  (CLEOCIN) 150 MG capsule, Take 600 mg by mouth. One hour before dental appointments, Disp: , Rfl:  .  levothyroxine (SYNTHROID, LEVOTHROID) 75 MCG tablet, Take 75 mcg by mouth daily before breakfast., Disp: , Rfl:  .  Multiple Vitamin (MULTIVITAMIN WITH MINERALS) TABS tablet, Take 1 tablet by mouth daily., Disp: , Rfl:  .  gabapentin (NEURONTIN) 100 MG capsule, Take 3 capsules (300 mg total) by mouth 2 (two) times daily., Disp: 30 capsule, Rfl: 0  PAST MEDICAL HISTORY: Past Medical History:  Diagnosis Date  . Acne rosacea   . Anxiety   . Arthritis    arthritis in hip   . Bronchiectasis ([place])   . Cataract    beginning  . Complication of anesthesia    "really sore throat"  . Corneal dystrophy   . DDD (degenerative disc disease), cervical    and lower back  . Diverticulosis   . Dysphagia    chronic- please see ultrasound done 4/16 15 in [facility]   . Dysrhythmia    sometimes patient has extra beats per patient   . Esophageal dysmotility   . Family history of adverse reaction to anesthesia    mother slow to wake up   . GERD (gastroesophageal reflux disease)    under control  . Glaucoma    "suspect"  . Gout    one finger x1 attack  . H/O hiatal hernia   . 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 wears hearing aides  . Heart murmur    no problem   . History of chicken pox   . History of melanoma    melanoma- 1986 and 1997   . Hypothyroidism   . Low back pain with sciatica   . Measles    hx of  . Mitral valve prolapse   . Nocturia   . Osteoporosis   . Peripheral neuropathy    feet  . PONV (postoperative nausea and vomiting)   . PTSD (post-traumatic stress disorder) 2006  . RSD (reflex sympathetic dystrophy) 1993  . Spider veins   . Urinary incontinence    occasional  . Varicose veins     PAST SURGICAL HISTORY: Past Surgical History:  Procedure Laterality Date  . ESOPHAGEAL MANOMETRY N/A 12/17/2015   Procedure: ESOPHAGEAL MANOMETRY (EM);  Surgeon: Mauri Pole, MD;  [facility]: [facility];  Service: Endoscopy;  Laterality: N/A;  . HARDWARE REMOVAL Left 10/17/2013   Procedure: HARDWARE REMOVAL LEFT HIP;  Surgeon: Gearlean Alf, MD;  Location: [facility];  Service: Orthopedics;  Laterality: Left;  . HIP FRACTURE SURGERY Left 2006   3 screws  . INSERTION OF MESH  02/08/2014   Procedure: INSERTION OF MESH;  Surgeon: Jackolyn Confer, MD;  Location: [facility];  Service: General;;  . melanoma surgery   1986, 1997  . PARATHYROIDECTOMY  04/02/15  . [place] IMPEDANCE STUDY N/A 12/17/2015   Procedure: [place] IMPEDANCE STUDY;  Surgeon: Mauri Pole, MD;  [facility];  Service: Endoscopy;  Laterality: N/A;  .  TOTAL HIP ARTHROPLASTY Left 04/10/2014   Procedure: LEFT TOTAL HIP ARTHROPLASTY ANTERIOR APPROACH;  Surgeon: Gearlean Alf, MD;  [facility];  Service: Orthopedics;  Laterality: Left;  . UMBILICAL HERNIA REPAIR N/A 02/08/2014   Procedure: HERNIA REPAIR UMBILICAL ADULT WITH MESH;  Surgeon: Jackolyn Confer, MD;  [facility];  Service: General;  Laterality: N/A;    FAMILY HISTORY: Family History  Problem Relation Age of Onset  . Cancer Mother   . Hypothyroidism Mother   . Congestive Heart Failure Mother   . Hypertension Mother   . Heart failure Father     SOCIAL HISTORY:  Social History   Social History  . Marital status: Married    Spouse name: N/A  . Number of children: N/A  . Years of education: N/A   Occupational History  . Not on file.   Social History Main Topics  . Smoking status: Never Smoker  . Smokeless tobacco: Never Used  . Alcohol use No  . Drug use: No  . Sexual activity: Not on file   Other Topics Concern  . Not on file   Social History Narrative  . No narrative on file     PHYSICAL EXAM  Vitals:   12/25/16 1024  BP: 134/80  Pulse: 76  Resp: 18  Weight: 171 lb 8 oz (77.8 kg)  Height: 5' 3.5" (1.613 m)    Body mass index is 29.9 kg/m.   General: The patient is well-developed and  well-nourished and in no acute distress   Neck: The neck is supple, no carotid bruits are noted.  The neck is nontender.  Cardiovascular: The heart has a regular rate and rhythm with a normal S1 and S2. There were no murmurs, gallops or rubs. Lungs are clear to auscultation.  Skin: There is a rash involving the +/- T11-L1 dermatomes on the left distant with a recovering shingles rash  Musculoskeletal:  Back is nontender  Neurologic Exam  Mental status: The patient is alert and oriented x 3 at the time of the examination. The patient has apparent normal recent and remote memory, with an apparently normal attention span and concentration ability.   Speech is normal.  Cranial nerves: Extraocular movements are full. Pupils are equal, round, and reactive to light and accomodation.  Visual fields are full.  Facial symmetry is present. There is good facial sensation to soft touch bilaterally.Facial strength is normal.  Trapezius and sternocleidomastoid strength is normal. No dysarthria is noted.  The tongue is midline, and the patient has symmetric elevation of the soft palate. No obvious hearing deficits are noted.  Motor:  Muscle bulk is normal.   Tone is normal. Strength is  5 / 5 in all 4 extremities.   Sensory: There is reduced sensation to vibration at toes compared to the ankles and knees. Sensation to touch and pain are normal in the feet and arms.   There is mild hyperpathia and allodynia over the skin involved in the shingles rash.  Coordination: Cerebellar testing reveals good finger-nose-finger and heel-to-shin bilaterally.  Gait and station: Station is normal.   Gait is normal. Tandem gait is normal for age. Romberg is negative.   Reflexes: Deep tendon reflexes are symmetric and normal bilaterally.   Plantar responses are flexor.    DIAGNOSTIC DATA (LABS, IMAGING, TESTING) - I reviewed patient records, labs, notes, testing and imaging myself where available.  Lab Results    Component Value Date   WBC 6.3 10/13/2016   HGB 14.1  10/13/2016   HCT 41.2 10/13/2016   MCV 88.2 10/13/2016   PLT 219 10/13/2016      Component Value Date/Time   NA 136 10/13/2016 1307   K 3.4 (L) 10/13/2016 1307   CL 102 10/13/2016 1307   CO2 25 10/13/2016 1307   GLUCOSE 107 (H) 10/13/2016 1307   BUN 7 10/13/2016 1307   CREATININE 0.85 10/13/2016 1307   CALCIUM 9.0 10/13/2016 1307   PROT 7.2 10/13/2016 1307   ALBUMIN 4.4 10/13/2016 1307   AST 33 10/13/2016 1307   ALT 26 10/13/2016 1307   ALKPHOS 40 10/13/2016 1307   BILITOT 0.6 10/13/2016 1307   GFRNONAA >60 10/13/2016 1307   GFRAA >60 10/13/2016 1307       ASSESSMENT AND PLAN  Postherpetic neuralgia  Dysesthesia  Polyneuropathy - Plan: NCV with EMG(electromyography), Sedimentation rate, Cryoglobulin, Multiple Myeloma Panel (SPEP&IFE w/QIG)    In summary, Teliyah Royal is a 71 year old woman with postherpetic neuralgia who also has a long history of toe numbness and could have a superimposed mild polyneuropathy.   To help with the postherpetic neuralgic pain I will try to get her titrated up to a higher dose of gabapentin. I recommended that she slowly build up to a dose of 100 mg in the a.m., 100 mg around noon, 200-300 milligrams in the evening and 200-300 milligrams at bedtime.   Additionally I gave her a prescription for lidocaine ointment at 1 inch twice a day.    She may have a mild polyneuropathy and we will evaluate this further with some blood work and an NCV/EMG. She has an uncomfortable sensation at times but not a severe amount of pain. Gabapentin would probably help those symptoms as well.  She will return to see me for the NCV/EMG and call sooner if there are new or worsening neurologic symptoms.  Thank you for asking me to see Ms. Huckeba. Please let me know if I can be of further assistance with her or other patients in the future.   Richard A. Felecia Shelling, MD, [facility] 51/08/15, 49:44 AM Certified in  Neurology, Clinical Neurophysiology, Sleep Medicine, Pain Medicine and Neuroimaging  [facility] [address], [place], [place] [address] [phone]

## 2016-12-26 ENCOUNTER — Ambulatory Visit: Payer: Medicare Other | Attending: Physical Medicine and Rehabilitation | Admitting: Physical Therapy

## 2016-12-26 ENCOUNTER — Encounter: Payer: Self-pay | Admitting: Physical Therapy

## 2016-12-26 DIAGNOSIS — M545 Low back pain, unspecified: Secondary | ICD-10-CM

## 2016-12-26 DIAGNOSIS — M542 Cervicalgia: Secondary | ICD-10-CM | POA: Diagnosis present

## 2016-12-26 DIAGNOSIS — M6281 Muscle weakness (generalized): Secondary | ICD-10-CM | POA: Insufficient documentation

## 2016-12-26 NOTE — Therapy (Signed)
[place], [place], [address] Phone: [phone]   Fax:  [phone]  Physical Therapy Treatment  Patient Details  Name: Brooke Jimenez MRN: [id number] Date of Birth: May 16, 1945 Referring Provider: Nelva Bush  Encounter Date: 12/26/2016      PT End of Session - 12/26/16 1049    Visit Number 5   Date for PT Re-Evaluation 02/16/17   PT Start Time 5465   PT Stop Time 1110   PT Time Calculation (min) 55 min      Past Medical History:  Diagnosis Date  . Acne rosacea   . Anxiety   . Arthritis    arthritis in hip   . Bronchiectasis ([place])   . Cataract    beginning  . Complication of anesthesia    "really sore throat"  . Corneal dystrophy   . DDD (degenerative disc disease), cervical    and lower back  . Diverticulosis   . Dysphagia    chronic- please see ultrasound done 4/16 15 in [facility]   . Dysrhythmia    sometimes patient has extra beats per patient   . Esophageal dysmotility   . Family history of adverse reaction to anesthesia    mother slow to wake up   . GERD (gastroesophageal reflux disease)    under control  . Glaucoma    "suspect"  . Gout    one finger x1 attack  . H/O hiatal hernia   . 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 wears hearing aides  . Heart murmur    no problem   . History of chicken pox   . History of melanoma    melanoma- 1986 and 1997   . Hypothyroidism   . Low back pain with sciatica   . Measles    hx of  . Mitral valve prolapse   . Nocturia   . Osteoporosis   . Peripheral neuropathy    feet  . PONV (postoperative nausea and vomiting)   . PTSD (post-traumatic stress disorder) 2006  . RSD (reflex sympathetic dystrophy) 1993  . Spider veins   . Urinary incontinence    occasional  . Varicose veins     Past Surgical History:  Procedure Laterality Date  . ESOPHAGEAL MANOMETRY N/A 12/17/2015   Procedure: ESOPHAGEAL MANOMETRY (EM);  Surgeon:  Mauri Pole, MD;  [facility];  Service: Endoscopy;  Laterality: N/A;  . HARDWARE REMOVAL Left 10/17/2013   Procedure: HARDWARE REMOVAL LEFT HIP;  Surgeon: Gearlean Alf, MD;  [facility];  Service: Orthopedics;  Laterality: Left;  . HIP FRACTURE SURGERY Left 2006   3 screws  . INSERTION OF MESH  02/08/2014   Procedure: INSERTION OF MESH;  Surgeon: Jackolyn Confer, MD;  [facility];  Service: General;;  . melanoma surgery   1986, 1997  . PARATHYROIDECTOMY  04/02/15  . [place] IMPEDANCE STUDY N/A 12/17/2015   Procedure: [place] IMPEDANCE STUDY;  Surgeon: Mauri Pole, MD;  [facility];  Service: Endoscopy;  Laterality: N/A;  . TOTAL HIP ARTHROPLASTY Left 04/10/2014   Procedure: LEFT TOTAL HIP ARTHROPLASTY ANTERIOR APPROACH;  Surgeon: Gearlean Alf, MD;  [facility];  Service: Orthopedics;  Laterality: Left;  . UMBILICAL HERNIA REPAIR N/A 02/08/2014   Procedure: HERNIA REPAIR UMBILICAL ADULT WITH MESH;  Surgeon: Jackolyn Confer, MD;  [facility];  Service: General;  Laterality: N/A;    There were no vitals filed for  this visit.      Subjective Assessment - 12/26/16 1022    Subjective doing okay- I feellike I just stay sore in all my joints   Currently in Pain? Yes   Pain Score 6    Pain Location Back                         [facility] Adult PT Treatment/Exercise - 12/26/16 0001      Lumbar Exercises: Aerobic   Stationary Bike Nustep L 5 6 min     Lumbar Exercises: Machines for Strengthening   Cybex Lumbar Extension black tband 2 sets 10   Other Lumbar Machine Exercise seated row and lat pull 15# 2 sets 10     Lumbar Exercises: Seated   Sit to Stand 10 reps  no UE wt ball with chest press   Sit to Stand Limitations add ball squeeze 15, clams and  hip flexion 15 green tband     Knee/Hip Exercises: Machines for Strengthening   Cybex Knee Extension 10# 2 sets 10   Cybex Knee Flexion 15# 2 sets 10     Knee/Hip Exercises:  Standing   Other Standing Knee Exercises ball push ups on wall with hip ext   Other Standing Knee Exercises wt ball obl 15 each     Moist Heat Therapy   Number Minutes Moist Heat 15 Minutes   Moist Heat Location Lumbar Spine;Cervical     Electrical Stimulation   Electrical Stimulation Location L/S area   Electrical Stimulation Action IFC   Electrical Stimulation Parameters supine   Electrical Stimulation Goals Pain                  PT Short Term Goals - 12/26/16 1047      PT SHORT TERM GOAL #1   Title independent with HEP   Status Achieved           PT Long Term Goals - 12/26/16 1047      PT LONG TERM GOAL #1   Title go up and down stairs step over step   Status Not Met     PT LONG TERM GOAL #2   Title decrease pain 50%   Status Not Met     PT LONG TERM GOAL #3   Title walk 100 feet without difficulty   Status Achieved               Plan - 12/26/16 1047    Clinical Impression Statement STG met. Progressing with LTGs. Pt tolerated addition of add'l strengtheing ex well today/   PT Treatment/Interventions ADLs/Self Care Home Management;Cryotherapy;Electrical Stimulation;Moist Heat;Ultrasound;Traction;Therapeutic activities;Therapeutic exercise;Gait training;Balance training;Patient/family education;Manual techniques   PT Next Visit Plan  work on overall and core strength      Patient will benefit from skilled therapeutic intervention in order to improve the following deficits and impairments:  Cardiopulmonary status limiting activity, Decreased activity tolerance, Decreased balance, Decreased mobility, Decreased strength, Improper body mechanics, Impaired flexibility, Pain, Increased muscle spasms, Difficulty walking, Decreased range of motion  Visit Diagnosis: Acute bilateral low back pain without sciatica  Muscle weakness (generalized)  Cervicalgia     Problem List Patient Active Problem List   Diagnosis Date Noted  . Postherpetic  neuralgia 12/25/2016  . Dysesthesia 12/25/2016  . Polyneuropathy 12/25/2016  . Exposure to influenza 04/01/2016  . Dysphagia   . Bronchiectasis without acute exacerbation ([place]) 08/30/2015  . Bronchiectasis ([place]) 08/14/2015  . Chronic rhinitis 08/14/2015  .  Avascular necrosis of femur head, left ([place]) 04/10/2014  . OA (osteoarthritis) of hip 04/10/2014  . Painful orthopaedic hardware ([place]) 10/17/2013    PAYSEUR,ANGIE PTA 12/26/2016, 10:50 AM  [place], [place], [address] Phone: [phone]   Fax:  [phone]  Name: Leylanie Woodmansee MRN: [id number] Date of Birth: August 01, 1945

## 2016-12-29 ENCOUNTER — Ambulatory Visit: Payer: Medicare Other | Admitting: Physical Therapy

## 2016-12-29 LAB — SEDIMENTATION RATE: Sed Rate: 3 mm/hr (ref 0–40)

## 2016-12-29 LAB — CRYOGLOBULIN

## 2016-12-29 LAB — MULTIPLE MYELOMA PANEL, SERUM
ALBUMIN SERPL ELPH-MCNC: 4 g/dL (ref 2.9–4.4)
ALPHA 1: 0.2 g/dL (ref 0.0–0.4)
ALPHA2 GLOB SERPL ELPH-MCNC: 0.8 g/dL (ref 0.4–1.0)
Albumin/Glob SerPl: 1.4 (ref 0.7–1.7)
B-Globulin SerPl Elph-Mcnc: 1 g/dL (ref 0.7–1.3)
GLOBULIN, TOTAL: 2.9 g/dL (ref 2.2–3.9)
Gamma Glob SerPl Elph-Mcnc: 0.9 g/dL (ref 0.4–1.8)
IGG (IMMUNOGLOBIN G), SERUM: 944 mg/dL (ref 700–1600)
IGM (IMMUNOGLOBULIN M), SRM: 19 mg/dL — AB (ref 26–217)
IgA/Immunoglobulin A, Serum: 196 mg/dL (ref 64–422)
TOTAL PROTEIN: 6.9 g/dL (ref 6.0–8.5)

## 2016-12-30 ENCOUNTER — Ambulatory Visit: Payer: Medicare Other | Admitting: Physical Therapy

## 2016-12-30 ENCOUNTER — Telehealth: Payer: Self-pay | Admitting: *Deleted

## 2016-12-30 DIAGNOSIS — M545 Low back pain, unspecified: Secondary | ICD-10-CM

## 2016-12-30 DIAGNOSIS — M6281 Muscle weakness (generalized): Secondary | ICD-10-CM

## 2016-12-30 DIAGNOSIS — M542 Cervicalgia: Secondary | ICD-10-CM

## 2016-12-30 NOTE — Telephone Encounter (Signed)
LMOM that lab work done in our office is fine.  She does not need to return this call unless she has questions/fim 

## 2016-12-30 NOTE — Telephone Encounter (Signed)
-----   Message from Britt Bottom, MD sent at 12/30/2016  9:06 AM EST ----- Please let her know that the lab work was fine.

## 2016-12-30 NOTE — Therapy (Signed)
[place], [place], [address] Phone: [phone]   Fax:  [phone]  Physical Therapy Treatment  Patient Details  Name: Brooke Jimenez MRN: [id number] Date of Birth: Dec 02, 1945 Referring Provider: Nelva Bush   Encounter Date: 12/30/2016  PT End of Session - 12/30/16 1430    Visit Number  6    Date for PT Re-Evaluation  02/16/17    PT Start Time  1400    PT Stop Time  1457    PT Time Calculation (min)  57 min       Past Medical History:  Diagnosis Date  . Acne rosacea   . Anxiety   . Arthritis    arthritis in hip   . Bronchiectasis ([place])   . Cataract    beginning  . Complication of anesthesia    "really sore throat"  . Corneal dystrophy   . DDD (degenerative disc disease), cervical    and lower back  . Diverticulosis   . Dysphagia    chronic- please see ultrasound done 4/16 15 in [facility]   . Dysrhythmia    sometimes patient has extra beats per patient   . Esophageal dysmotility   . Family history of adverse reaction to anesthesia    mother slow to wake up   . GERD (gastroesophageal reflux disease)    under control  . Glaucoma    "suspect"  . Gout    one finger x1 attack  . H/O hiatal hernia   . 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 wears hearing aides  . Heart murmur    no problem   . History of chicken pox   . History of melanoma    melanoma- 1986 and 1997   . Hypothyroidism   . Low back pain with sciatica   . Measles    hx of  . Mitral valve prolapse   . Nocturia   . Osteoporosis   . Peripheral neuropathy    feet  . PONV (postoperative nausea and vomiting)   . PTSD (post-traumatic stress disorder) 2006  . RSD (reflex sympathetic dystrophy) 1993  . Spider veins   . Urinary incontinence    occasional  . Varicose veins     Past Surgical History:  Procedure Laterality Date  . HIP FRACTURE SURGERY Left 2006   3 screws  . melanoma surgery   1986, 1997  .  PARATHYROIDECTOMY  04/02/15    There were no vitals filed for this visit.  Subjective Assessment - 12/30/16 1406    Subjective  left side is really bothering me,might still be shingles    Currently in Pain?  Yes    Pain Score  7     Pain Location  Back                      [facility] Adult PT Treatment/Exercise - 12/30/16 0001      Lumbar Exercises: Aerobic   Stationary Bike  Nustep L 5 7 min      Lumbar Exercises: Supine   Ab Set  10 reps;3 seconds    Clam  15 reps    Clam Limitations  green tband     Other Supine Lumbar Exercises  ball squeeze add    Other Supine Lumbar Exercises  bridge with ball, KTC and obl      Knee/Hip Exercises: Machines for Strengthening   Cybex Knee Extension  10# 2 sets  10    Cybex Knee Flexion  15# 2 sets 10      Moist Heat Therapy   Number Minutes Moist Heat  15 Minutes    Moist Heat Location  Lumbar Spine;Cervical      Electrical Stimulation   Electrical Stimulation Location  L/S area    Electrical Stimulation Action  IFC    Electrical Stimulation Parameters  supine    Electrical Stimulation Goals  Pain               PT Short Term Goals - 12/26/16 1047      PT SHORT TERM GOAL #1   Title  independent with HEP    Status  Achieved        PT Long Term Goals - 12/26/16 1047      PT LONG TERM GOAL #1   Title  go up and down stairs step over step    Status  Not Met      PT LONG TERM GOAL #2   Title  decrease pain 50%    Status  Not Met      PT LONG TERM GOAL #3   Title  walk 100 feet without difficulty    Status  Achieved            Plan - 12/30/16 1431    Clinical Impression Statement  pt c/o left side pain throughout session, cuing for correct tech with ex to decrease compensation    PT Treatment/Interventions  ADLs/Self Care Home Management;Cryotherapy;Electrical Stimulation;Moist Heat;Ultrasound;Traction;Therapeutic activities;Therapeutic exercise;Gait training;Balance training;Patient/family  education;Manual techniques    PT Next Visit Plan   work on overall and core strength       Patient will benefit from skilled therapeutic intervention in order to improve the following deficits and impairments:  Cardiopulmonary status limiting activity, Decreased activity tolerance, Decreased balance, Decreased mobility, Decreased strength, Improper body mechanics, Impaired flexibility, Pain, Increased muscle spasms, Difficulty walking, Decreased range of motion  Visit Diagnosis: Acute bilateral low back pain without sciatica  Muscle weakness (generalized)  Cervicalgia     Problem List Patient Active Problem List   Diagnosis Date Noted  . Postherpetic neuralgia 12/25/2016  . Dysesthesia 12/25/2016  . Polyneuropathy 12/25/2016  . Exposure to influenza 04/01/2016  . Dysphagia   . Bronchiectasis without acute exacerbation ([place]) 08/30/2015  . Bronchiectasis ([place]) 08/14/2015  . Chronic rhinitis 08/14/2015  . Avascular necrosis of femur head, left ([place]) 04/10/2014  . OA (osteoarthritis) of hip 04/10/2014  . Painful orthopaedic hardware [facility]) 10/17/2013    Syerra Abdelrahman,ANGIE PTA 12/30/2016, 2:33 PM  [place], [place], [address] Phone: [phone]   Fax:  [phone]  Name: Brooke Jimenez MRN: [id number] Date of Birth: Apr 24, 1945

## 2016-12-31 ENCOUNTER — Encounter: Payer: Self-pay | Admitting: Physical Therapy

## 2016-12-31 ENCOUNTER — Ambulatory Visit: Payer: Medicare Other | Admitting: Physical Therapy

## 2016-12-31 DIAGNOSIS — M545 Low back pain, unspecified: Secondary | ICD-10-CM

## 2016-12-31 DIAGNOSIS — M6281 Muscle weakness (generalized): Secondary | ICD-10-CM

## 2016-12-31 DIAGNOSIS — M542 Cervicalgia: Secondary | ICD-10-CM

## 2016-12-31 NOTE — Therapy (Signed)
[place], [place], [address] Phone: [phone]   Fax:  [phone]  Physical Therapy Treatment  Patient Details  Name: Brooke Jimenez MRN: [id number] Date of Birth: [birth year] Referring Provider: Nelva Bush   Encounter Date: 12/31/2016  PT End of Session - 12/31/16 1607    Visit Number  7    Date for PT Re-Evaluation  02/16/17    PT Start Time  1017    PT Stop Time  1627    PT Time Calculation (min)  57 min    Activity Tolerance  Patient tolerated treatment well    Behavior During Therapy  [facility] for tasks assessed/performed       Past Medical History:  Diagnosis Date  . Acne rosacea   . Anxiety   . Arthritis    arthritis in hip   . Bronchiectasis ([place])   . Cataract    beginning  . Complication of anesthesia    "really sore throat"  . Corneal dystrophy   . DDD (degenerative disc disease), cervical    and lower back  . Diverticulosis   . Dysphagia    chronic- please see ultrasound done 4/16 15 in EPIC   . Dysrhythmia    sometimes patient has extra beats per patient   . Esophageal dysmotility   . Family history of adverse reaction to anesthesia    mother slow to wake up   . GERD (gastroesophageal reflux disease)    under control  . Glaucoma    "suspect"  . Gout    one finger x1 attack  . H/O hiatal hernia   . 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 wears hearing aides  . Heart murmur    no problem   . History of chicken pox   . History of melanoma    melanoma- 1986 and 1997   . Hypothyroidism   . Low back pain with sciatica   . Measles    hx of  . Mitral valve prolapse   . Nocturia   . Osteoporosis   . Peripheral neuropathy    feet  . PONV (postoperative nausea and vomiting)   . PTSD (post-traumatic stress disorder) 2006  . RSD (reflex sympathetic dystrophy) 1993  . Spider veins   . Urinary incontinence    occasional  . Varicose veins     Past Surgical  History:  Procedure Laterality Date  . HIP FRACTURE SURGERY Left 2006   3 screws  . melanoma surgery   1986, 1997  . PARATHYROIDECTOMY  04/02/15    There were no vitals filed for this visit.  Subjective Assessment - 12/31/16 1533    Subjective  Continues to c/o left anterior lateral hip and abdomen pain    Currently in Pain?  Yes    Pain Score  6     Pain Location  Abdomen    Pain Orientation  Left;Anterior;Lateral    Aggravating Factors   unsure                      [facility] Adult PT Treatment/Exercise - 12/31/16 0001      Lumbar Exercises: Aerobic   Stationary Bike  Nustep L 5 7 min      Lumbar Exercises: Machines for Strengthening   Other Lumbar Machine Exercise  seated row and lat pull 15# 2 sets 10      Lumbar Exercises: Supine   Other Supine Lumbar  Exercises  ball squeeze add      Knee/Hip Exercises: Machines for Strengthening   Cybex Knee Extension  10# 2 sets 10    Cybex Knee Flexion  20# 2x10    Cybex Leg Press  20# 2x10      Knee/Hip Exercises: Standing   Other Standing Knee Exercises  wt ball obl 15 each      Knee/Hip Exercises: Supine   Other Supine Knee/Hip Exercises  feet on ball K2C, trunk rotation, small bridges and isometric abdominals      Moist Heat Therapy   Moist Heat Location  Lumbar Spine;Cervical      Electrical Stimulation   Electrical Stimulation Location  L/S area    Electrical Stimulation Action  IFC    Electrical Stimulation Parameters  supine     Electrical Stimulation Goals  Pain               PT Short Term Goals - 12/26/16 1047      PT SHORT TERM GOAL #1   Title  independent with HEP    Status  Achieved        PT Long Term Goals - 12/31/16 1608      PT LONG TERM GOAL #1   Title  go up and down stairs step over step    Status  On-going      PT LONG TERM GOAL #2   Title  decrease pain 50%    Status  On-going            Plan - 12/31/16 1607    Clinical Impression Statement  Patient without  c/o increased pain during the session, she tolerated the exercises well , needing some cues to hold posture and activate the abs    PT Next Visit Plan   work on overall and core strength    Consulted and Agree with Plan of Care  Patient       Patient will benefit from skilled therapeutic intervention in order to improve the following deficits and impairments:     Visit Diagnosis: Acute bilateral low back pain without sciatica  Muscle weakness (generalized)  Cervicalgia     Problem List Patient Active Problem List   Diagnosis Date Noted  . Postherpetic neuralgia 12/25/2016  . Dysesthesia 12/25/2016  . Polyneuropathy 12/25/2016  . Exposure to influenza 04/01/2016  . Dysphagia   . Bronchiectasis without acute exacerbation ([place]) 08/30/2015  . Bronchiectasis ([place]) 08/14/2015  . Chronic rhinitis 08/14/2015  . Avascular necrosis of femur head, left ([place]) 04/10/2014  . OA (osteoarthritis) of hip 04/10/2014  . Painful orthopaedic hardware [facility]) 10/17/2013    Sumner Boast., PT 12/31/2016, 4:09 PM  [place], [place], [address] Phone: [phone]   Fax:  [phone]  Name: Caylee Vlachos MRN: [id number] Date of Birth: November 06, 1945

## 2017-01-02 ENCOUNTER — Ambulatory Visit: Payer: Medicare Other | Admitting: Physical Therapy

## 2017-01-02 ENCOUNTER — Encounter: Payer: Self-pay | Admitting: Physical Therapy

## 2017-01-02 DIAGNOSIS — M6281 Muscle weakness (generalized): Secondary | ICD-10-CM

## 2017-01-02 DIAGNOSIS — M545 Low back pain, unspecified: Secondary | ICD-10-CM

## 2017-01-02 DIAGNOSIS — M542 Cervicalgia: Secondary | ICD-10-CM

## 2017-01-02 NOTE — Therapy (Signed)
[place], [place], [address] Phone: [phone]   Fax:  [phone]  Physical Therapy Treatment  Patient Details  Name: Delanee Xin MRN: [id number] Date of Birth: [birth year] Referring Provider: Nelva Bush   Encounter Date: 01/02/2017  PT End of Session - 01/02/17 1147    Visit Number  8    Date for PT Re-Evaluation  02/16/17    PT Start Time  1059    PT Stop Time  1157    PT Time Calculation (min)  58 min    Activity Tolerance  Patient tolerated treatment well    Behavior During Therapy  [facility] for tasks assessed/performed       Past Medical History:  Diagnosis Date  . Acne rosacea   . Anxiety   . Arthritis    arthritis in hip   . Bronchiectasis ([place])   . Cataract    beginning  . Complication of anesthesia    "really sore throat"  . Corneal dystrophy   . DDD (degenerative disc disease), cervical    and lower back  . Diverticulosis   . Dysphagia    chronic- please see ultrasound done 4/16 15 in EPIC   . Dysrhythmia    sometimes patient has extra beats per patient   . Esophageal dysmotility   . Family history of adverse reaction to anesthesia    mother slow to wake up   . GERD (gastroesophageal reflux disease)    under control  . Glaucoma    "suspect"  . Gout    one finger x1 attack  . H/O hiatal hernia   . 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 wears hearing aides  . Heart murmur    no problem   . History of chicken pox   . History of melanoma    melanoma- 1986 and 1997   . Hypothyroidism   . Low back pain with sciatica   . Measles    hx of  . Mitral valve prolapse   . Nocturia   . Osteoporosis   . Peripheral neuropathy    feet  . PONV (postoperative nausea and vomiting)   . PTSD (post-traumatic stress disorder) 2006  . RSD (reflex sympathetic dystrophy) 1993  . Spider veins   . Urinary incontinence    occasional  . Varicose veins     Past Surgical  History:  Procedure Laterality Date  . HIP FRACTURE SURGERY Left 2006   3 screws  . melanoma surgery   1986, 1997  . PARATHYROIDECTOMY  04/02/15    There were no vitals filed for this visit.  Subjective Assessment - 01/02/17 1059    Subjective  Reports that she is doing better today, less pain.    Currently in Pain?  Yes    Pain Score  3     Pain Location  Abdomen    Pain Orientation  Left;Anterior    Pain Descriptors / Indicators  Aching                      [facility] Adult PT Treatment/Exercise - 01/02/17 0001      Lumbar Exercises: Aerobic   Stationary Bike  Nustep L 5 7 min      Lumbar Exercises: Machines for Strengthening   Other Lumbar Machine Exercise  seated row and lat pull 15# 2 sets 10      Lumbar Exercises: Standing   Other Standing Lumbar  Exercises  back to wall weighted ball overhead lift      Lumbar Exercises: Supine   Other Supine Lumbar Exercises  ball squeeze add      Knee/Hip Exercises: Machines for Strengthening   Cybex Knee Extension  10# 2 sets 10    Cybex Knee Flexion  25# 2x10    Cybex Leg Press  20# 3x10    Other Machine  5# straight arm extension, scapular retraction, standing 2# hip flexion and extension      Moist Heat Therapy   Moist Heat Location  Lumbar Spine;Cervical      Electrical Stimulation   Electrical Stimulation Location  L/S area    Electrical Stimulation Action  IFC    Electrical Stimulation Parameters  supine    Electrical Stimulation Goals  Pain               PT Short Term Goals - 01/02/17 1149      PT SHORT TERM GOAL #1   Title  independent with HEP    Status  Achieved        PT Long Term Goals - 12/31/16 1608      PT LONG TERM GOAL #1   Title  go up and down stairs step over step    Status  On-going      PT LONG TERM GOAL #2   Title  decrease pain 50%    Status  On-going            Plan - 01/02/17 1147    Clinical Impression Statement  Patient doing well, reporting no increased  pain with any activities, the overhead ball lift and the hip flexion exercises were difficult    PT Next Visit Plan   work on overall and core strength    Consulted and Agree with Plan of Care  Patient       Patient will benefit from skilled therapeutic intervention in order to improve the following deficits and impairments:  Cardiopulmonary status limiting activity, Decreased activity tolerance, Decreased balance, Decreased mobility, Decreased strength, Improper body mechanics, Impaired flexibility, Pain, Increased muscle spasms, Difficulty walking, Decreased range of motion  Visit Diagnosis: Acute bilateral low back pain without sciatica  Muscle weakness (generalized)  Cervicalgia     Problem List Patient Active Problem List   Diagnosis Date Noted  . Postherpetic neuralgia 12/25/2016  . Dysesthesia 12/25/2016  . Polyneuropathy 12/25/2016  . Exposure to influenza 04/01/2016  . Dysphagia   . Bronchiectasis without acute exacerbation ([place]) 08/30/2015  . Bronchiectasis ([place]) 08/14/2015  . Chronic rhinitis 08/14/2015  . Avascular necrosis of femur head, left ([place]) 04/10/2014  . OA (osteoarthritis) of hip 04/10/2014  . Painful orthopaedic hardware [facility]) 10/17/2013    Sumner Boast., PT 01/02/2017, 11:49 AM  [place], [place], [address] Phone: [phone]   Fax:  [phone]  Name: Brandey Vandalen MRN: [id number] Date of Birth: 1945/10/06

## 2017-01-05 ENCOUNTER — Ambulatory Visit: Payer: Medicare Other | Admitting: Physical Therapy

## 2017-01-06 ENCOUNTER — Telehealth: Payer: Self-pay | Admitting: Neurology

## 2017-01-06 NOTE — Telephone Encounter (Addendum)
Pt called she needs directions on how to titrate up on gabapentin. Pt also has questions about labs

## 2017-01-07 ENCOUNTER — Encounter: Payer: Self-pay | Admitting: Physical Therapy

## 2017-01-07 ENCOUNTER — Ambulatory Visit: Payer: Medicare Other | Admitting: Physical Therapy

## 2017-01-07 DIAGNOSIS — M6281 Muscle weakness (generalized): Secondary | ICD-10-CM

## 2017-01-07 DIAGNOSIS — M545 Low back pain, unspecified: Secondary | ICD-10-CM

## 2017-01-07 DIAGNOSIS — M542 Cervicalgia: Secondary | ICD-10-CM

## 2017-01-07 NOTE — Telephone Encounter (Signed)
Lengthy conversation (15 min.) regarding RAS recommendation to slowly increase Gabapentin.  Instructions on how to do that. Pt. provided conflicting information (requsted info on titrating up on Gabapentin, then sts. pain is already improved, doesn't need to increase Gabapentin, then asking if titrating up would be helpful.  I reviewed ov note with her, explained why mult. smaller doses of Gabapentin during the day would help, how titrating up would hopefully help her avoid the side effects of drowsiness, fogginess.  Reviewed plan for EMG/NCV.  Reviewed labwork.  Pt. repeatedly expressed concern regarding ESR of 3.  I have explained this is wnl, which is good.  The worry would be if ESR was high./fim

## 2017-01-07 NOTE — Therapy (Signed)
[place], [place], [address] Phone: [phone]   Fax:  [phone]  Physical Therapy Treatment  Patient Details  Name: Brooke Jimenez MRN: [id number] Date of Birth: 1945-07-08 Referring Provider: Nelva Bush   Encounter Date: 01/07/2017  PT End of Session - 01/07/17 1616    Visit Number  9    Date for PT Re-Evaluation  02/16/17    PT Start Time  1530    PT Stop Time  1631    PT Time Calculation (min)  61 min    Activity Tolerance  Patient tolerated treatment well    Behavior During Therapy  [facility] for tasks assessed/performed       Past Medical History:  Diagnosis Date  . Acne rosacea   . Anxiety   . Arthritis    arthritis in hip   . Bronchiectasis ([place])   . Cataract    beginning  . Complication of anesthesia    "really sore throat"  . Corneal dystrophy   . DDD (degenerative disc disease), cervical    and lower back  . Diverticulosis   . Dysphagia    chronic- please see ultrasound done 4/16 15 in EPIC   . Dysrhythmia    sometimes patient has extra beats per patient   . Esophageal dysmotility   . Family history of adverse reaction to anesthesia    mother slow to wake up   . GERD (gastroesophageal reflux disease)    under control  . Glaucoma    "suspect"  . Gout    one finger x1 attack  . H/O hiatal hernia   . 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 wears hearing aides  . Heart murmur    no problem   . History of chicken pox   . History of melanoma    melanoma- 1986 and 1997   . Hypothyroidism   . Low back pain with sciatica   . Measles    hx of  . Mitral valve prolapse   . Nocturia   . Osteoporosis   . Peripheral neuropathy    feet  . PONV (postoperative nausea and vomiting)   . PTSD (post-traumatic stress disorder) 2006  . RSD (reflex sympathetic dystrophy) 1993  . Spider veins   . Urinary incontinence    occasional  . Varicose veins     Past Surgical  History:  Procedure Laterality Date  . HIP FRACTURE SURGERY Left 2006   3 screws  . melanoma surgery   1986, 1997  . PARATHYROIDECTOMY  04/02/15    There were no vitals filed for this visit.  Subjective Assessment - 01/07/17 1535    Subjective  Patient reports some increased pain recently, reports that she is taking Gabapentin but does not like how she feels in the AM    Currently in Pain?  Yes    Pain Score  5     Pain Location  Back    Pain Orientation  Lower    Pain Descriptors / Indicators  Aching    Aggravating Factors   housework                      [facility] Adult PT Treatment/Exercise - 01/07/17 0001      Lumbar Exercises: Aerobic   Stationary Bike  Nustep L 5 7 min      Lumbar Exercises: Machines for Strengthening   Other Lumbar Machine Exercise  seated  row and lat pull 15# 2 sets 10    Other Lumbar Machine Exercise  5# AR press 10x each way      Lumbar Exercises: Standing   Other Standing Lumbar Exercises  back to wall weighted ball overhead lift      Lumbar Exercises: Supine   Other Supine Lumbar Exercises  bridge with ball, KTC and obl      Knee/Hip Exercises: Machines for Strengthening   Cybex Knee Extension  10# 2 sets 10    Cybex Knee Flexion  25# 2x10    Cybex Leg Press  20# 3x10    Other Machine  5# straight arm extension, scapular retraction, standing 2# hip flexion and extension      Moist Heat Therapy   Moist Heat Location  Lumbar Spine;Hip;Cervical      Electrical Stimulation   Electrical Stimulation Location  left hip anterior and posterior    Electrical Stimulation Action  IFC    Electrical Stimulation Parameters  supine    Electrical Stimulation Goals  Pain               PT Short Term Goals - 01/02/17 1149      PT SHORT TERM GOAL #1   Title  independent with HEP    Status  Achieved        PT Long Term Goals - 01/07/17 1617      PT LONG TERM GOAL #1   Title  go up and down stairs step over step    Status   Partially Met      PT LONG TERM GOAL #2   Title  decrease pain 50%    Status  Partially Met            Plan - 01/07/17 1616    Clinical Impression Statement  Patient reported some knee pain/soreness after the last session, replicated the last session without any increase of weights and she had no issues while here.  Needs core work    PT Next Visit Plan   work on overall and core Armed forces operational officer with Plan of Care  Patient       Patient will benefit from skilled therapeutic intervention in order to improve the following deficits and impairments:  Cardiopulmonary status limiting activity, Decreased activity tolerance, Decreased balance, Decreased mobility, Decreased strength, Improper body mechanics, Impaired flexibility, Pain, Increased muscle spasms, Difficulty walking, Decreased range of motion  Visit Diagnosis: Acute bilateral low back pain without sciatica  Muscle weakness (generalized)  Cervicalgia     Problem List Patient Active Problem List   Diagnosis Date Noted  . Postherpetic neuralgia 12/25/2016  . Dysesthesia 12/25/2016  . Polyneuropathy 12/25/2016  . Exposure to influenza 04/01/2016  . Dysphagia   . Bronchiectasis without acute exacerbation ([place]) 08/30/2015  . Bronchiectasis ([place]) 08/14/2015  . Chronic rhinitis 08/14/2015  . Avascular necrosis of femur head, left ([place]) 04/10/2014  . OA (osteoarthritis) of hip 04/10/2014  . Painful orthopaedic hardware [facility]) 10/17/2013    Sumner Boast., PT 01/07/2017, 4:18 PM  [place], [place], [address] Phone: [phone]   Fax:  [phone]  Name: Brooke Jimenez MRN: [id number] Date of Birth: May 24, 1945

## 2017-01-09 ENCOUNTER — Ambulatory Visit: Payer: Medicare Other | Admitting: Physical Therapy

## 2017-01-09 ENCOUNTER — Encounter: Payer: Self-pay | Admitting: Physical Therapy

## 2017-01-09 DIAGNOSIS — M6281 Muscle weakness (generalized): Secondary | ICD-10-CM

## 2017-01-09 DIAGNOSIS — M545 Low back pain, unspecified: Secondary | ICD-10-CM

## 2017-01-09 DIAGNOSIS — M542 Cervicalgia: Secondary | ICD-10-CM

## 2017-01-09 NOTE — Therapy (Signed)
[place], [place], [address] Phone: [phone]   Fax:  [phone]  Physical Therapy Treatment  Patient Details  Name: Brooke Jimenez MRN: [id number] Date of Birth: 1945-08-07 Referring Provider: Nelva Bush   Encounter Date: 01/09/2017  PT End of Session - 01/09/17 1002    Visit Number  10    Date for PT Re-Evaluation  02/16/17    PT Start Time  0930    PT Stop Time  1025    PT Time Calculation (min)  55 min       Past Medical History:  Diagnosis Date  . Acne rosacea   . Anxiety   . Arthritis    arthritis in hip   . Bronchiectasis ([place])   . Cataract    beginning  . Complication of anesthesia    "really sore throat"  . Corneal dystrophy   . DDD (degenerative disc disease), cervical    and lower back  . Diverticulosis   . Dysphagia    chronic- please see ultrasound done 4/16 15 in EPIC   . Dysrhythmia    sometimes patient has extra beats per patient   . Esophageal dysmotility   . Family history of adverse reaction to anesthesia    mother slow to wake up   . GERD (gastroesophageal reflux disease)    under control  . Glaucoma    "suspect"  . Gout    one finger x1 attack  . H/O hiatal hernia   . 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 wears hearing aides  . Heart murmur    no problem   . History of chicken pox   . History of melanoma    melanoma- 1986 and 1997   . Hypothyroidism   . Low back pain with sciatica   . Measles    hx of  . Mitral valve prolapse   . Nocturia   . Osteoporosis   . Peripheral neuropathy    feet  . PONV (postoperative nausea and vomiting)   . PTSD (post-traumatic stress disorder) 2006  . RSD (reflex sympathetic dystrophy) 1993  . Spider veins   . Urinary incontinence    occasional  . Varicose veins     Past Surgical History:  Procedure Laterality Date  . ESOPHAGEAL MANOMETRY ([facility]) N/A 12/17/2015   Performed by Mauri Pole, MD  at [place]  . HARDWARE REMOVAL LEFT HIP Left 10/17/2013   Performed by Gearlean Alf, MD at [facility] ORS  . HERNIA REPAIR UMBILICAL ADULT WITH MESH N/A 02/08/2014   Performed by Jackolyn Confer, MD at [facility] ORS  . HIP FRACTURE SURGERY Left 2006   3 screws  . INSERTION OF MESH  02/08/2014   Performed by Jackolyn Confer, MD at [facility] ORS  . LEFT TOTAL HIP ARTHROPLASTY ANTERIOR APPROACH Left 04/10/2014   Performed by Gearlean Alf, MD at [facility] ORS  . melanoma surgery   1986, 1997  . PARATHYROIDECTOMY  04/02/15  . [place] IMPEDANCE STUDY N/A 12/17/2015   Performed by Mauri Pole, MD at [facility] ENDOSCOPY    There were no vitals filed for this visit.  Subjective Assessment - 01/09/17 0932    Subjective  feel lighter on my feet and stronger, pain comes and goes and shingle pain is still an issue. neck has been bad this week    Currently in Pain?  Yes    Pain Score  5  Pain Location  Neck         [facility] PT Assessment - 01/09/17 0001      AROM   Overall AROM Comments  cerv and lumb ROM decreased 25%                  [facility] Adult PT Treatment/Exercise - 01/09/17 0001      Exercises   Exercises  Neck      Neck Exercises: Theraband   Shoulder Extension  15 reps;Red    Rows  15 reps;Red    Shoulder External Rotation  15 reps;Red    Horizontal ADduction  15 reps;Red      Neck Exercises: Standing   Neck Retraction  10 reps;3 secs with ball on wall    Wall Push Ups  15 reps with ball    Other Standing Exercises  4# shruggs and rolls 15 each      Lumbar Exercises: Aerobic   Stationary Bike  Nustep L 5 6 min    UBE (Upper Arm Bike)  L 3 2 fwd/2 back      Lumbar Exercises: Machines for Strengthening   Other Lumbar Machine Exercise  seated row and lat pull 20# 2 sets 12      Moist Heat Therapy   Number Minutes Moist Heat  15 Minutes    Moist Heat Location  Cervical;Lumbar Spine      Electrical Stimulation   Electrical Stimulation Location  cerv/trap and lumb     Electrical Stimulation Action  premod    Electrical Stimulation Parameters  supine    Electrical Stimulation Goals  Pain               PT Short Term Goals - 01/02/17 1149      PT SHORT TERM GOAL #1   Title  independent with HEP    Status  Achieved        PT Long Term Goals - 01/09/17 0933      PT LONG TERM GOAL #1   Title  go up and down stairs step over step    Status  Partially Met      PT LONG TERM GOAL #2   Title  decrease pain 50%    Baseline  20% and verb feeling stronger    Status  Partially Met      PT LONG TERM GOAL #3   Title  walk 100 feet without difficulty    Status  Achieved      PT LONG TERM GOAL #4   Title  increase ROM of the cervical and lumbar spine 25%    Status  Achieved            Plan - 01/09/17 1002    Clinical Impression Statement  pt vague with symptoms and they change, refers alot to shingle pain. pt feeling stronger and toelrating ex well but needs cuing to engage core. Porgressing with goals    PT Treatment/Interventions  ADLs/Self Care Home Management;Cryotherapy;Electrical Stimulation;Moist Heat;Ultrasound;Traction;Therapeutic activities;Therapeutic exercise;Gait training;Balance training;Patient/family education;Manual techniques    PT Next Visit Plan   work on overall and core strength       Patient will benefit from skilled therapeutic intervention in order to improve the following deficits and impairments:  Cardiopulmonary status limiting activity, Decreased activity tolerance, Decreased balance, Decreased mobility, Decreased strength, Improper body mechanics, Impaired flexibility, Pain, Increased muscle spasms, Difficulty walking, Decreased range of motion  Visit Diagnosis: Acute bilateral low back pain without sciatica  Muscle weakness (  generalized)  Cervicalgia     Problem List Patient Active Problem List   Diagnosis Date Noted  . Postherpetic neuralgia 12/25/2016  . Dysesthesia 12/25/2016  . Polyneuropathy  12/25/2016  . Exposure to influenza 04/01/2016  . Dysphagia   . Bronchiectasis without acute exacerbation ([place]) 08/30/2015  . Bronchiectasis ([place]) 08/14/2015  . Chronic rhinitis 08/14/2015  . Avascular necrosis of femur head, left ([place]) 04/10/2014  . OA (osteoarthritis) of hip 04/10/2014  . Painful orthopaedic hardware [facility]) 10/17/2013    Charee Tumblin,ANGIE PTA 01/09/2017, 10:05 AM  [place], [place], [address] Phone: [phone]   Fax:  [phone]  Name: Brooke Jimenez MRN: [id number] Date of Birth: 1945-03-15

## 2017-01-12 ENCOUNTER — Ambulatory Visit: Payer: Medicare Other | Admitting: Physical Therapy

## 2017-01-12 ENCOUNTER — Encounter: Payer: Self-pay | Admitting: Physical Therapy

## 2017-01-12 DIAGNOSIS — M545 Low back pain, unspecified: Secondary | ICD-10-CM

## 2017-01-12 DIAGNOSIS — M542 Cervicalgia: Secondary | ICD-10-CM

## 2017-01-12 DIAGNOSIS — M6281 Muscle weakness (generalized): Secondary | ICD-10-CM

## 2017-01-12 NOTE — Therapy (Signed)
[place], [place], [address] Phone: [phone]   Fax:  [phone]  Physical Therapy Treatment  Patient Details  Name: Brooke Jimenez MRN: [id number] Date of Birth: [birth year] Referring Provider: Nelva Bush   Encounter Date: 01/12/2017  PT End of Session - 01/12/17 1539    Visit Number  11    Date for PT Re-Evaluation  02/16/17    PT Start Time  1527    PT Stop Time  1630    PT Time Calculation (min)  63 min    Activity Tolerance  Patient tolerated treatment well    Behavior During Therapy  [facility] for tasks assessed/performed       Past Medical History:  Diagnosis Date  . Acne rosacea   . Anxiety   . Arthritis    arthritis in hip   . Bronchiectasis ([place])   . Cataract    beginning  . Complication of anesthesia    "really sore throat"  . Corneal dystrophy   . DDD (degenerative disc disease), cervical    and lower back  . Diverticulosis   . Dysphagia    chronic- please see ultrasound done 4/16 15 in EPIC   . Dysrhythmia    sometimes patient has extra beats per patient   . Esophageal dysmotility   . Family history of adverse reaction to anesthesia    mother slow to wake up   . GERD (gastroesophageal reflux disease)    under control  . Glaucoma    "suspect"  . Gout    one finger x1 attack  . H/O hiatal hernia   . 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 wears hearing aides  . Heart murmur    no problem   . History of chicken pox   . History of melanoma    melanoma- 1986 and 1997   . Hypothyroidism   . Low back pain with sciatica   . Measles    hx of  . Mitral valve prolapse   . Nocturia   . Osteoporosis   . Peripheral neuropathy    feet  . PONV (postoperative nausea and vomiting)   . PTSD (post-traumatic stress disorder) 2006  . RSD (reflex sympathetic dystrophy) 1993  . Spider veins   . Urinary incontinence    occasional  . Varicose veins     Past Surgical  History:  Procedure Laterality Date  . ESOPHAGEAL MANOMETRY (EM) N/A 12/17/2015   Performed by Mauri Pole, MD at [facility]  . HARDWARE REMOVAL LEFT HIP Left 10/17/2013   Performed by Gearlean Alf, MD at [facility] ORS  . HERNIA REPAIR UMBILICAL ADULT WITH MESH N/A 02/08/2014   Performed by Jackolyn Confer, MD at [facility] ORS  . HIP FRACTURE SURGERY Left 2006   3 screws  . INSERTION OF MESH  02/08/2014   Performed by Jackolyn Confer, MD at [facility] ORS  . LEFT TOTAL HIP ARTHROPLASTY ANTERIOR APPROACH Left 04/10/2014   Performed by Gearlean Alf, MD at [facility] ORS  . melanoma surgery   1986, 1997  . PARATHYROIDECTOMY  04/02/15  . [place] IMPEDANCE STUDY N/A 12/17/2015   Performed by Mauri Pole, MD at [facility] ENDOSCOPY    There were no vitals filed for this visit.  Subjective Assessment - 01/12/17 1528    Subjective  Patient reports that she was active on the weekend and did more walking, reports that she is having more low  back pain and some in the right side today as well    Currently in Pain?  Yes    Pain Score  7     Pain Location  Back    Pain Orientation  Lower;Right;Left    Pain Descriptors / Indicators  Aching;Sore    Aggravating Factors   standing and walking                      [facility] Adult PT Treatment/Exercise - 01/12/17 0001      Lumbar Exercises: Aerobic   Stationary Bike  Nustep L 5 6 min    UBE (Upper Arm Bike)  L 3 2 fwd/2 back      Lumbar Exercises: Machines for Strengthening   Other Lumbar Machine Exercise  seated row and lat pull 20# 2 sets 12    Other Lumbar Machine Exercise  5# AR press 10x each way      Lumbar Exercises: Standing   Other Standing Lumbar Exercises  back to wall weighted ball overhead lift      Knee/Hip Exercises: Machines for Strengthening   Cybex Knee Extension  10# 2 sets 10    Cybex Knee Flexion  25# 2x10    Cybex Leg Press  20# 3x10    Other Machine  5# straight arm extension, scapular retraction, standing 2# hip flexion  and extension      Moist Heat Therapy   Number Minutes Moist Heat  15 Minutes    Moist Heat Location  Cervical;Lumbar Spine      Electrical Stimulation   Electrical Stimulation Location  lumbar     Electrical Stimulation Action  IFC    Electrical Stimulation Parameters  supine    Electrical Stimulation Goals  Pain               PT Short Term Goals - 01/02/17 1149      PT SHORT TERM GOAL #1   Title  independent with HEP    Status  Achieved        PT Long Term Goals - 01/09/17 0933      PT LONG TERM GOAL #1   Title  go up and down stairs step over step    Status  Partially Met      PT LONG TERM GOAL #2   Title  decrease pain 50%    Baseline  20% and verb feeling stronger    Status  Partially Met      PT LONG TERM GOAL #3   Title  walk 100 feet without difficulty    Status  Achieved      PT LONG TERM GOAL #4   Title  increase ROM of the cervical and lumbar spine 25%    Status  Achieved            Plan - 01/12/17 1539    Clinical Impression Statement  Patient reports pain today in the right hip and low back.  She reports unsure of why but does say she did a lot of walking and standing over the weekend    PT Next Visit Plan   work on overall and core strength    Consulted and Agree with Plan of Care  Patient       Patient will benefit from skilled therapeutic intervention in order to improve the following deficits and impairments:  Cardiopulmonary status limiting activity, Decreased activity tolerance, Decreased balance, Decreased mobility, Decreased strength, Improper body mechanics, Impaired flexibility, Pain,   Increased muscle spasms, Difficulty walking, Decreased range of motion  Visit Diagnosis: Acute bilateral low back pain without sciatica  Muscle weakness (generalized)  Cervicalgia     Problem List Patient Active Problem List   Diagnosis Date Noted  . Postherpetic neuralgia 12/25/2016  . Dysesthesia 12/25/2016  . Polyneuropathy  12/25/2016  . Exposure to influenza 04/01/2016  . Dysphagia   . Bronchiectasis without acute exacerbation (HCC) 08/30/2015  . Bronchiectasis (HCC) 08/14/2015  . Chronic rhinitis 08/14/2015  . Avascular necrosis of femur head, left (HCC) 04/10/2014  . OA (osteoarthritis) of hip 04/10/2014  . Painful orthopaedic hardware (HCC) 10/17/2013    ALBRIGHT,MICHAEL W., PT 01/12/2017, 4:14 PM  [facility] [address] [place], [place], [address] Phone: [phone]   Fax:  [phone]  Name: Jadi Kerner MRN: [id number] Date of Birth: 01/09/1946   

## 2017-01-13 ENCOUNTER — Ambulatory Visit: Payer: Medicare Other | Admitting: Physical Therapy

## 2017-01-19 ENCOUNTER — Encounter: Payer: Self-pay | Admitting: Physical Therapy

## 2017-01-19 ENCOUNTER — Ambulatory Visit: Payer: Medicare Other | Admitting: Physical Therapy

## 2017-01-19 DIAGNOSIS — M545 Low back pain, unspecified: Secondary | ICD-10-CM

## 2017-01-19 DIAGNOSIS — M542 Cervicalgia: Secondary | ICD-10-CM

## 2017-01-19 DIAGNOSIS — M6281 Muscle weakness (generalized): Secondary | ICD-10-CM

## 2017-01-19 NOTE — Therapy (Signed)
[place] [address] [place], [place], [address] Phone: [phone]   Fax:  [phone]  Physical Therapy Treatment  Patient Details  Name: Brooke Jimenez MRN: [id number] Date of Birth: August 20, 1945 Referring Provider: Nelva Bush   Encounter Date: 01/19/2017  PT End of Session - 01/19/17 8616    Visit Number  12    Date for PT Re-Evaluation  02/16/17    PT Start Time  1315    PT Stop Time  1416    PT Time Calculation (min)  61 min    Activity Tolerance  Patient tolerated treatment well    Behavior During Therapy  [facility] for tasks assessed/performed       Past Medical History:  Diagnosis Date  . Acne rosacea   . Anxiety   . Arthritis    arthritis in hip   . Bronchiectasis ([place])   . Cataract    beginning  . Complication of anesthesia    "really sore throat"  . Corneal dystrophy   . DDD (degenerative disc disease), cervical    and lower back  . Diverticulosis   . Dysphagia    chronic- please see ultrasound done 4/16 15 in EP[facility]   . Dysrhythmia    sometimes patient has extra beats per patient   . Esophageal dysmotility   . Family history of adverse reaction to anesthesia    mother slow to wake up   . GERD (gastroesophageal reflux disease)    under control  . Glaucoma    "suspect"  . Gout    one finger x1 attack  . H/O hiatal hernia   . 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 wears hearing aides  . Heart murmur    no problem   . History of chicken pox   . History of melanoma    melanoma- 1986 and 1997   . Hypothyroidism   . Low back pain with sciatica   . Measles    hx of  . Mitral valve prolapse   . Nocturia   . Osteoporosis   . Peripheral neuropathy    feet  . PONV (postoperative nausea and vomiting)   . PTSD (post-traumatic stress disorder) 2006  . RSD (reflex sympathetic dystrophy) 1993  . Spider veins   . Urinary incontinence    occasional  . Varicose veins     Past Surgical  History:  Procedure Laterality Date  . ESOPHAGEAL MANOMETRY N/A 12/17/2015   Procedure: ESOPHAGEAL MANOMETRY (EM);  Surgeon: Mauri Pole, MD;  Location: [facility] ENDOSCOPY;  Service: Endoscopy;  Laterality: N/A;  . HARDWARE REMOVAL Left 10/17/2013   Procedure: HARDWARE REMOVAL LEFT HIP;  Surgeon: Gearlean Alf, MD;  Location: [facility] ORS;  Service: Orthopedics;  Laterality: Left;  . HIP FRACTURE SURGERY Left 2006   3 screws  . INSERTION OF MESH  02/08/2014   Procedure: INSERTION OF MESH;  Surgeon: Jackolyn Confer, MD;  Location: [facility] ORS;  Service: General;;  . melanoma surgery   1986, 1997  . PARATHYROIDECTOMY  04/02/15  . [place] IMPEDANCE STUDY N/A 12/17/2015   Procedure: [place] IMPEDANCE STUDY;  Surgeon: Mauri Pole, MD;  Location: [facility] ENDOSCOPY;  Service: Endoscopy;  Laterality: N/A;  . TOTAL HIP ARTHROPLASTY Left 04/10/2014   Procedure: LEFT TOTAL HIP ARTHROPLASTY ANTERIOR APPROACH;  Surgeon: Gearlean Alf, MD;  Location: [facility] ORS;  Service: Orthopedics;  Laterality: Left;  . UMBILICAL HERNIA REPAIR N/A 02/08/2014   Procedure: HERNIA REPAIR  UMBILICAL ADULT WITH MESH;  Surgeon: Jackolyn Confer, MD;  Location: [facility] ORS;  Service: General;  Laterality: N/A;    There were no vitals filed for this visit.  Subjective Assessment - 01/19/17 1312    Subjective  Patient reports that she was up and cooking a lot and sat on some bad seating areas, she reports that she was very sore in the low back and the hips     Currently in Pain?  Yes    Pain Score  7     Pain Location  Back    Pain Orientation  Lower    Aggravating Factors   standing, sitting on poor surfaces                      [facility] Adult PT Treatment/Exercise - 01/19/17 0001      Lumbar Exercises: Aerobic   Stationary Bike  Nustep L 5 6 min    UBE (Upper Arm Bike)  L 3 2 fwd/2 back      Lumbar Exercises: Machines for Strengthening   Other Lumbar Machine Exercise  seated row and lat pull 20# 2 sets 12    Other Lumbar  Machine Exercise  5# AR press 10x each way, yellow tband hip extension and abduction      Lumbar Exercises: Standing   Other Standing Lumbar Exercises  weighted ball carry      Knee/Hip Exercises: Machines for Strengthening   Cybex Knee Extension  10# 2 sets 10    Cybex Knee Flexion  25# 2x10    Cybex Leg Press  30# 3x10, 20# 2x5 single legs    Other Machine  5# straight arm extension, scapular retraction, standing 2# hip flexion and extension      Moist Heat Therapy   Number Minutes Moist Heat  15 Minutes    Moist Heat Location  Cervical;Lumbar Spine      Electrical Stimulation   Electrical Stimulation Location  lumbar     Electrical Stimulation Action  IFC    Electrical Stimulation Parameters  supine    Electrical Stimulation Goals  Pain               PT Short Term Goals - 01/02/17 1149      PT SHORT TERM GOAL #1   Title  independent with HEP    Status  Achieved        PT Long Term Goals - 01/09/17 0933      PT LONG TERM GOAL #1   Title  go up and down stairs step over step    Status  Partially Met      PT LONG TERM GOAL #2   Title  decrease pain 50%    Baseline  20% and verb feeling stronger    Status  Partially Met      PT LONG TERM GOAL #3   Title  walk 100 feet without difficulty    Status  Achieved      PT LONG TERM GOAL #4   Title  increase ROM of the cervical and lumbar spine 25%    Status  Achieved            Plan - 01/19/17 1445    Clinical Impression Statement  Patient with a little more pain in the low back today due to stnading and cooking over the weekend.  She has some weakness of the core.    PT Next Visit Plan   work on overall  and core strength    Consulted and Agree with Plan of Care  Patient       Patient will benefit from skilled therapeutic intervention in order to improve the following deficits and impairments:  Cardiopulmonary status limiting activity, Decreased activity tolerance, Decreased balance, Decreased mobility,  Decreased strength, Improper body mechanics, Impaired flexibility, Pain, Increased muscle spasms, Difficulty walking, Decreased range of motion  Visit Diagnosis: Acute bilateral low back pain without sciatica  Muscle weakness (generalized)  Cervicalgia     Problem List Patient Active Problem List   Diagnosis Date Noted  . Postherpetic neuralgia 12/25/2016  . Dysesthesia 12/25/2016  . Polyneuropathy 12/25/2016  . Exposure to influenza 04/01/2016  . Dysphagia   . Bronchiectasis without acute exacerbation ([place]) 08/30/2015  . Bronchiectasis ([place]) 08/14/2015  . Chronic rhinitis 08/14/2015  . Avascular necrosis of femur head, left ([place]) 04/10/2014  . OA (osteoarthritis) of hip 04/10/2014  . Painful orthopaedic hardware [facility]) 10/17/2013    Sumner Boast., PT 01/19/2017, 2:47 PM  [place], [place], [address] Phone: [phone]   Fax:  [phone]  Name: Brooke Jimenez MRN: [id number] Date of Birth: 1945/09/08

## 2017-01-21 ENCOUNTER — Encounter: Payer: Self-pay | Admitting: Physical Therapy

## 2017-01-21 ENCOUNTER — Ambulatory Visit: Payer: Medicare Other | Admitting: Physical Therapy

## 2017-01-21 DIAGNOSIS — M542 Cervicalgia: Secondary | ICD-10-CM

## 2017-01-21 DIAGNOSIS — M545 Low back pain, unspecified: Secondary | ICD-10-CM

## 2017-01-21 DIAGNOSIS — M6281 Muscle weakness (generalized): Secondary | ICD-10-CM

## 2017-01-21 NOTE — Therapy (Signed)
[place] [address] [place], [place], [address] Phone: [phone]   Fax:  [phone]  Physical Therapy Treatment  Patient Details  Name: Brooke Jimenez MRN: [id number] Date of Birth: 1946-01-24 Referring Provider: Nelva Bush   Encounter Date: 01/21/2017  PT End of Session - 01/21/17 1619    Visit Number  13    Date for PT Re-Evaluation  02/16/17    PT Start Time  1526    PT Stop Time  1630    PT Time Calculation (min)  64 min    Activity Tolerance  Patient tolerated treatment well    Behavior During Therapy  [facility] for tasks assessed/performed       Past Medical History:  Diagnosis Date  . Acne rosacea   . Anxiety   . Arthritis    arthritis in hip   . Bronchiectasis ([place])   . Cataract    beginning  . Complication of anesthesia    "really sore throat"  . Corneal dystrophy   . DDD (degenerative disc disease), cervical    and lower back  . Diverticulosis   . Dysphagia    chronic- please see ultrasound done 4/16 15 in EP[facility]   . Dysrhythmia    sometimes patient has extra beats per patient   . Esophageal dysmotility   . Family history of adverse reaction to anesthesia    mother slow to wake up   . GERD (gastroesophageal reflux disease)    under control  . Glaucoma    "suspect"  . Gout    one finger x1 attack  . H/O hiatal hernia   . 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 wears hearing aides  . Heart murmur    no problem   . History of chicken pox   . History of melanoma    melanoma- 1986 and 1997   . Hypothyroidism   . Low back pain with sciatica   . Measles    hx of  . Mitral valve prolapse   . Nocturia   . Osteoporosis   . Peripheral neuropathy    feet  . PONV (postoperative nausea and vomiting)   . PTSD (post-traumatic stress disorder) 2006  . RSD (reflex sympathetic dystrophy) 1993  . Spider veins   . Urinary incontinence    occasional  . Varicose veins     Past Surgical  History:  Procedure Laterality Date  . ESOPHAGEAL MANOMETRY N/A 12/17/2015   Procedure: ESOPHAGEAL MANOMETRY (EM);  Surgeon: Mauri Pole, MD;  [facility];  Service: Endoscopy;  Laterality: N/A;  . HARDWARE REMOVAL Left 10/17/2013   Procedure: HARDWARE REMOVAL LEFT HIP;  Surgeon: Gearlean Alf, MD;  [facility] ORS;  Service: Orthopedics;  Laterality: Left;  . HIP FRACTURE SURGERY Left 2006   3 screws  . INSERTION OF MESH  02/08/2014   Procedure: INSERTION OF MESH;  Surgeon: Jackolyn Confer, MD;  [facility] ORS;  Service: General;;  . melanoma surgery   1986, 1997  . PARATHYROIDECTOMY  04/02/15  . [place] IMPEDANCE STUDY N/A 12/17/2015   Procedure: [place] IMPEDANCE STUDY;  Surgeon: Mauri Pole, MD;  [facility]OS[facility];  Service: Endoscopy;  Laterality: N/A;  . TOTAL HIP ARTHROPLASTY Left 04/10/2014   Procedure: LEFT TOTAL HIP ARTHROPLASTY ANTERIOR APPROACH;  Surgeon: Gearlean Alf, MD;  [facility] ORS;  Service: Orthopedics;  Laterality: Left;  . UMBILICAL HERNIA REPAIR N/A 02/08/2014   Procedure: HERNIA REPAIR  UMBILICAL ADULT WITH MESH;  Surgeon: Jackolyn Confer, MD;  [facility]: [facility] ORS;  Service: General;  Laterality: N/A;    There were no vitals filed for this visit.  Subjective Assessment - 01/21/17 1532    Subjective  Patient reports that she is sore after PT treatment.  REports she feels stiff in the low back    Currently in Pain?  Yes    Pain Score  6     Pain Location  Back    Pain Orientation  Lower                      [facility] Adult PT Treatment/Exercise - 01/21/17 0001      Neck Exercises: Theraband   Shoulder Extension  20 reps;Red    Shoulder External Rotation  20 reps;Red      Neck Exercises: Standing   Other Standing Exercises  4# shruggs and rolls 15 each    Other Standing Exercises  corner stretch      Lumbar Exercises: Aerobic   Stationary Bike  Nustep L 5 6 min    UBE (Upper Arm Bike)  L 3 2 fwd/2 back      Lumbar  Exercises: Machines for Strengthening   Other Lumbar Machine Exercise  seated row and lat pull 20# 2 sets 12    Other Lumbar Machine Exercise  5# AR press 10x each way, yellow tband hip extension and abduction      Lumbar Exercises: Standing   Other Standing Lumbar Exercises  weighted ball carry      Knee/Hip Exercises: Machines for Strengthening   Cybex Knee Extension  15# 2 sets 10    Cybex Knee Flexion  35# 2x10    Cybex Leg Press  30# 3x10, 20# 2x5 single legs    Other Machine  5# straight arm extension, scapular retraction, standing 2# hip flexion and extension      Moist Heat Therapy   Number Minutes Moist Heat  15 Minutes    Moist Heat Location  Cervical;Lumbar Spine      Electrical Stimulation   Electrical Stimulation Location  lumbar     Electrical Stimulation Action  IFC    Electrical Stimulation Parameters  supine    Electrical Stimulation Goals  Pain               PT Short Term Goals - 01/02/17 1149      PT SHORT TERM GOAL #1   Title  independent with HEP    Status  Achieved        PT Long Term Goals - 01/09/17 0933      PT LONG TERM GOAL #1   Title  go up and down stairs step over step    Status  Partially Met      PT LONG TERM GOAL #2   Title  decrease pain 50%    Baseline  20% and verb feeling stronger    Status  Partially Met      PT LONG TERM GOAL #3   Title  walk 100 feet without difficulty    Status  Achieved      PT LONG TERM GOAL #4   Title  increase ROM of the cervical and lumbar spine 25%    Status  Achieved            Plan - 01/21/17 1620    Clinical Impression Statement  Patient overall doing better, has a hard time using and setting abdominals.  She is weak in th ehips    PT Next Visit Plan   work on overall and core strength    Consulted and Agree with Plan of Care  Patient       Patient will benefit from skilled therapeutic intervention in order to improve the following deficits and impairments:  Cardiopulmonary  status limiting activity, Decreased activity tolerance, Decreased balance, Decreased mobility, Decreased strength, Improper body mechanics, Impaired flexibility, Pain, Increased muscle spasms, Difficulty walking, Decreased range of motion  Visit Diagnosis: Acute bilateral low back pain without sciatica  Muscle weakness (generalized)  Cervicalgia     Problem List Patient Active Problem List   Diagnosis Date Noted  . Postherpetic neuralgia 12/25/2016  . Dysesthesia 12/25/2016  . Polyneuropathy 12/25/2016  . Exposure to influenza 04/01/2016  . Dysphagia   . Bronchiectasis without acute exacerbation ([place]) 08/30/2015  . Bronchiectasis ([place]) 08/14/2015  . Chronic rhinitis 08/14/2015  . Avascular necrosis of femur head, left ([place]) 04/10/2014  . OA (osteoarthritis) of hip 04/10/2014  . Painful orthopaedic hardware [facility]) 10/17/2013    Sumner Boast., PT 01/21/2017, 4:46 PM  [place], [place], [address] Phone: [phone]   Fax:  [phone]  Name: Brooke Jimenez MRN: [id number] Date of Birth: August 01, 1945

## 2017-01-23 ENCOUNTER — Ambulatory Visit: Payer: Medicare Other | Admitting: Physical Therapy

## 2017-01-23 ENCOUNTER — Encounter: Payer: Self-pay | Admitting: Physical Therapy

## 2017-01-23 DIAGNOSIS — M545 Low back pain, unspecified: Secondary | ICD-10-CM

## 2017-01-23 DIAGNOSIS — M6281 Muscle weakness (generalized): Secondary | ICD-10-CM

## 2017-01-23 DIAGNOSIS — M542 Cervicalgia: Secondary | ICD-10-CM

## 2017-01-23 NOTE — Therapy (Signed)
[place], [place], [address] Phone: [phone]   Fax:  [phone]  Physical Therapy Treatment  Patient Details  Name: Brooke Jimenez MRN: [id number] Date of Birth: [birth year] Referring Provider: Nelva Bush   Encounter Date: 01/23/2017  PT End of Session - 01/23/17 1036    Visit Number  14    Date for PT Re-Evaluation  02/16/17    PT Start Time  0928    PT Stop Time  1036    PT Time Calculation (min)  68 min    Activity Tolerance  Patient tolerated treatment well    Behavior During Therapy  [facility] for tasks assessed/performed       Past Medical History:  Diagnosis Date  . Acne rosacea   . Anxiety   . Arthritis    arthritis in hip   . Bronchiectasis ([place])   . Cataract    beginning  . Complication of anesthesia    "really sore throat"  . Corneal dystrophy   . DDD (degenerative disc disease), cervical    and lower back  . Diverticulosis   . Dysphagia    chronic- please see ultrasound done 4/16 15 in EPIC   . Dysrhythmia    sometimes patient has extra beats per patient   . Esophageal dysmotility   . Family history of adverse reaction to anesthesia    mother slow to wake up   . GERD (gastroesophageal reflux disease)    under control  . Glaucoma    "suspect"  . Gout    one finger x1 attack  . H/O hiatal hernia   . 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 wears hearing aides  . Heart murmur    no problem   . History of chicken pox   . History of melanoma    melanoma- 1986 and 1997   . Hypothyroidism   . Low back pain with sciatica   . Measles    hx of  . Mitral valve prolapse   . Nocturia   . Osteoporosis   . Peripheral neuropathy    feet  . PONV (postoperative nausea and vomiting)   . PTSD (post-traumatic stress disorder) 2006  . RSD (reflex sympathetic dystrophy) 1993  . Spider veins   . Urinary incontinence    occasional  . Varicose veins     Past Surgical  History:  Procedure Laterality Date  . ESOPHAGEAL MANOMETRY N/A 12/17/2015   Procedure: ESOPHAGEAL MANOMETRY (EM);  Surgeon: Mauri Pole, MD;  [facility];  Service: Endoscopy;  Laterality: N/A;  . HARDWARE REMOVAL Left 10/17/2013   Procedure: HARDWARE REMOVAL LEFT HIP;  Surgeon: Gearlean Alf, MD;  [facility];  Service: Orthopedics;  Laterality: Left;  . HIP FRACTURE SURGERY Left 2006   3 screws  . INSERTION OF MESH  02/08/2014   Procedure: INSERTION OF MESH;  Surgeon: Jackolyn Confer, MD;  [facility];  Service: General;;  . melanoma surgery   1986, 1997  . PARATHYROIDECTOMY  04/02/15  . [place] IMPEDANCE STUDY N/A 12/17/2015   Procedure: [place] IMPEDANCE STUDY;  Surgeon: Mauri Pole, MD;  [facility];  Service: Endoscopy;  Laterality: N/A;  . TOTAL HIP ARTHROPLASTY Left 04/10/2014   Procedure: LEFT TOTAL HIP ARTHROPLASTY ANTERIOR APPROACH;  Surgeon: Gearlean Alf, MD;  [facility];  Service: Orthopedics;  Laterality: Left;  . UMBILICAL HERNIA REPAIR N/A 02/08/2014   Procedure: HERNIA REPAIR  UMBILICAL ADULT WITH MESH;  Surgeon: Jackolyn Confer, MD;  [facility];  Service: General;  Laterality: N/A;    There were no vitals filed for this visit.  Subjective Assessment - 01/23/17 0940    Subjective  I get up and I am so stiff    Currently in Pain?  Yes    Pain Score  5     Pain Location  Back    Pain Orientation  Lower    Pain Descriptors / Indicators  Tightness                      [facility] Adult PT Treatment/Exercise - 01/23/17 0001      Ambulation/Gait   Gait Comments  gait around the building working on steady pace and cues for breathing      Neck Exercises: Standing   Other Standing Exercises  4# shruggs and rolls 15 each, sidebending    Other Standing Exercises  corner stretch      Lumbar Exercises: Aerobic   Stationary Bike  Nustep L 5 6 min    UBE (Upper Arm Bike)  L 3 2 fwd/2 back      Lumbar Exercises:  Machines for Strengthening   Other Lumbar Machine Exercise  seated row and lat pull 20# 2 sets 12    Other Lumbar Machine Exercise  5# hip abductiona nd extension      Knee/Hip Exercises: Stretches   Other Knee/Hip Stretches  lumbar rotation ande sidebending stretches      Knee/Hip Exercises: Machines for Strengthening   Cybex Leg Press  30# 3x10, 20# 2x5 single legs      Knee/Hip Exercises: Standing   Other Standing Knee Exercises  ball push ups on wall with hip ext    Other Standing Knee Exercises  wt ball obl 15 each      Moist Heat Therapy   Number Minutes Moist Heat  15 Minutes    Moist Heat Location  Cervical;Lumbar Spine      Electrical Stimulation   Electrical Stimulation Location  lumbar     Electrical Stimulation Action  IFC    Electrical Stimulation Parameters  supine    Electrical Stimulation Goals  Pain               PT Short Term Goals - 01/02/17 1149      PT SHORT TERM GOAL #1   Title  independent with HEP    Status  Achieved        PT Long Term Goals - 01/23/17 1039      PT LONG TERM GOAL #1   Title  go up and down stairs step over step    Status  Achieved            Plan - 01/23/17 1036    Clinical Impression Statement  Patient was very short of breath with walk around the building, her O2 sat was 98%.  She does have some limitations in side bending and trunk rotation, has some scoliosis that may affect this.    PT Next Visit Plan   work on overall and core strength and flexibility    Consulted and Agree with Plan of Care  Patient       Patient will benefit from skilled therapeutic intervention in order to improve the following deficits and impairments:  Cardiopulmonary status limiting activity, Decreased activity tolerance, Decreased balance, Decreased mobility, Decreased strength, Improper body mechanics, Impaired flexibility, Pain, Increased muscle spasms, Difficulty  walking, Decreased range of motion  Visit Diagnosis: Acute  bilateral low back pain without sciatica  Muscle weakness (generalized)  Cervicalgia     Problem List Patient Active Problem List   Diagnosis Date Noted  . Postherpetic neuralgia 12/25/2016  . Dysesthesia 12/25/2016  . Polyneuropathy 12/25/2016  . Exposure to influenza 04/01/2016  . Dysphagia   . Bronchiectasis without acute exacerbation ([place]) 08/30/2015  . Bronchiectasis ([place]) 08/14/2015  . Chronic rhinitis 08/14/2015  . Avascular necrosis of femur head, left ([place]) 04/10/2014  . OA (osteoarthritis) of hip 04/10/2014  . Painful orthopaedic hardware [facility]) 10/17/2013    Sumner Boast., PT 01/23/2017, 10:39 AM  [place], [place], [address] Phone: [phone]   Fax:  [phone]  Name: Brooke Jimenez MRN: [id number] Date of Birth: Mar 15, 1945

## 2017-01-26 ENCOUNTER — Encounter: Payer: Self-pay | Admitting: Physical Therapy

## 2017-01-26 ENCOUNTER — Ambulatory Visit: Payer: Medicare Other | Attending: Physical Medicine and Rehabilitation | Admitting: Physical Therapy

## 2017-01-26 DIAGNOSIS — M545 Low back pain, unspecified: Secondary | ICD-10-CM

## 2017-01-26 DIAGNOSIS — M542 Cervicalgia: Secondary | ICD-10-CM | POA: Insufficient documentation

## 2017-01-26 DIAGNOSIS — M6281 Muscle weakness (generalized): Secondary | ICD-10-CM

## 2017-01-26 NOTE — Therapy (Signed)
[place], [place], [address] Phone: [phone]   Fax:  [phone]  Physical Therapy Treatment  Patient Details  Name: Brooke Jimenez MRN: [id number] Date of Birth: 02-Oct-1945 Referring Provider: Nelva Bush   Encounter Date: 01/26/2017  PT End of Session - 01/26/17 1619    Visit Number  15    Date for PT Re-Evaluation  02/16/17    PT Start Time  9371    PT Stop Time  1615    PT Time Calculation (min)  45 min    Activity Tolerance  Patient tolerated treatment well    Behavior During Therapy  [facility] for tasks assessed/performed       Past Medical History:  Diagnosis Date  . Acne rosacea   . Anxiety   . Arthritis    arthritis in hip   . Bronchiectasis ([place])   . Cataract    beginning  . Complication of anesthesia    "really sore throat"  . Corneal dystrophy   . DDD (degenerative disc disease), cervical    and lower back  . Diverticulosis   . Dysphagia    chronic- please see ultrasound done 4/16 15 in EPIC   . Dysrhythmia    sometimes patient has extra beats per patient   . Esophageal dysmotility   . Family history of adverse reaction to anesthesia    mother slow to wake up   . GERD (gastroesophageal reflux disease)    under control  . Glaucoma    "suspect"  . Gout    one finger x1 attack  . H/O hiatal hernia   . 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 wears hearing aides  . Heart murmur    no problem   . History of chicken pox   . History of melanoma    melanoma- 1986 and 1997   . Hypothyroidism   . Low back pain with sciatica   . Measles    hx of  . Mitral valve prolapse   . Nocturia   . Osteoporosis   . Peripheral neuropathy    feet  . PONV (postoperative nausea and vomiting)   . PTSD (post-traumatic stress disorder) 2006  . RSD (reflex sympathetic dystrophy) 1993  . Spider veins   . Urinary incontinence    occasional  . Varicose veins     Past Surgical  History:  Procedure Laterality Date  . ESOPHAGEAL MANOMETRY N/A 12/17/2015   Procedure: ESOPHAGEAL MANOMETRY (EM);  Surgeon: Mauri Pole, MD;  [facility];  Service: Endoscopy;  Laterality: N/A;  . HARDWARE REMOVAL Left 10/17/2013   Procedure: HARDWARE REMOVAL LEFT HIP;  Surgeon: Gearlean Alf, MD;  [facility];  Service: Orthopedics;  Laterality: Left;  . HIP FRACTURE SURGERY Left 2006   3 screws  . INSERTION OF MESH  02/08/2014   Procedure: INSERTION OF MESH;  Surgeon: Jackolyn Confer, MD;  [facility];  Service: General;;  . melanoma surgery   1986, 1997  . PARATHYROIDECTOMY  04/02/15  . [place] IMPEDANCE STUDY N/A 12/17/2015   Procedure: [place] IMPEDANCE STUDY;  Surgeon: Mauri Pole, MD;  [facility];  Service: Endoscopy;  Laterality: N/A;  . TOTAL HIP ARTHROPLASTY Left 04/10/2014   Procedure: LEFT TOTAL HIP ARTHROPLASTY ANTERIOR APPROACH;  Surgeon: Gearlean Alf, MD;  [facility];  Service: Orthopedics;  Laterality: Left;  . UMBILICAL HERNIA REPAIR N/A 02/08/2014   Procedure: HERNIA REPAIR  UMBILICAL ADULT WITH MESH;  Surgeon: Jackolyn Confer, MD;  [facility];  Service: General;  Laterality: N/A;    There were no vitals filed for this visit.  Subjective Assessment - 01/26/17 1530    Subjective  Pretty good weekend    Currently in Pain?  Yes    Pain Score  4     Pain Location  Back    Pain Orientation  Lower    Aggravating Factors   worse after sitting                      [facility] Adult PT Treatment/Exercise - 01/26/17 0001      Neck Exercises: Standing   Other Standing Exercises  4# shruggs and rolls 15 each, sidebending    Other Standing Exercises  corner stretch      Lumbar Exercises: Aerobic   Stationary Bike  Nustep L 5 6 min      Lumbar Exercises: Machines for Strengthening   Other Lumbar Machine Exercise  seated row and lat pull 20# 2 sets 12    Other Lumbar Machine Exercise  5# hip abductiona nd  extension      Knee/Hip Exercises: Aerobic   Elliptical  R=5 I=10 x 2 minutes., very short of breath      Knee/Hip Exercises: Machines for Strengthening   Cybex Knee Extension  15# 2 sets 10    Cybex Knee Flexion  35# 2x10    Cybex Leg Press  40# 3x10, 20# 2x5 single legs    Other Machine  5# straight arm extension, scapular retraction, standing 2# hip flexion and extension      Knee/Hip Exercises: Standing   Other Standing Knee Exercises  wt ball obl 15 each      Moist Heat Therapy   Number Minutes Moist Heat  15 Minutes    Moist Heat Location  Cervical;Lumbar Spine      Electrical Stimulation   Electrical Stimulation Location  lumbar     Electrical Stimulation Action  IFC    Electrical Stimulation Parameters  supine    Electrical Stimulation Goals  Pain               PT Short Term Goals - 01/02/17 1149      PT SHORT TERM GOAL #1   Title  independent with HEP    Status  Achieved        PT Long Term Goals - 01/23/17 1039      PT LONG TERM GOAL #1   Title  go up and down stairs step over step    Status  Achieved            Plan - 01/26/17 1621    Clinical Impression Statement  very short of breath with the elliptical, needs cues ot tighten abs with the exercises    PT Next Visit Plan   work on overall and core strength and flexibility    Consulted and Agree with Plan of Care  Patient       Patient will benefit from skilled therapeutic intervention in order to improve the following deficits and impairments:  Cardiopulmonary status limiting activity, Decreased activity tolerance, Decreased balance, Decreased mobility, Decreased strength, Improper body mechanics, Impaired flexibility, Pain, Increased muscle spasms, Difficulty walking, Decreased range of motion  Visit Diagnosis: Acute bilateral low back pain without sciatica  Muscle weakness (generalized)  Cervicalgia     Problem List Patient Active Problem List   Diagnosis Date Noted  .  Postherpetic neuralgia 12/25/2016  . Dysesthesia 12/25/2016  . Polyneuropathy 12/25/2016  . Exposure to influenza 04/01/2016  . Dysphagia   . Bronchiectasis without acute exacerbation ([place]) 08/30/2015  . Bronchiectasis ([place]) 08/14/2015  . Chronic rhinitis 08/14/2015  . Avascular necrosis of femur head, left ([place]) 04/10/2014  . OA (osteoarthritis) of hip 04/10/2014  . Painful orthopaedic hardware [facility]) 10/17/2013    Sumner Boast., PT 01/26/2017, 4:22 PM  [place], [place], [address] Phone: [phone]   Fax:  [phone]  Name: Brooke Jimenez MRN: [id number] Date of Birth: January 08, 1946

## 2017-01-27 ENCOUNTER — Ambulatory Visit (INDEPENDENT_AMBULATORY_CARE_PROVIDER_SITE_OTHER): Payer: Medicare Other | Admitting: Neurology

## 2017-01-27 ENCOUNTER — Encounter: Payer: Self-pay | Admitting: Neurology

## 2017-01-27 DIAGNOSIS — R208 Other disturbances of skin sensation: Secondary | ICD-10-CM

## 2017-01-27 DIAGNOSIS — G629 Polyneuropathy, unspecified: Secondary | ICD-10-CM

## 2017-01-27 DIAGNOSIS — Z0289 Encounter for other administrative examinations: Secondary | ICD-10-CM

## 2017-01-27 NOTE — Progress Notes (Signed)
Full Name: Brooke Jimenez Gender: Female MRN #: [id number] Date of Birth: 1945/09/17    Visit Date: 01/27/2017 09:26 Age: 71 Years 9 Months Old Examining Physician: Arlice Colt, MD     History: Brooke Jimenez is a 71 year old woman with numbness and pain in both feet, right mildly more than left.  Nerve conduction studies:   The left peroneal motor response had a mildly reduced amplitude with normal distal latency and conduction velocity. Right peroneal motor response in both tibial motor responses were normal. Bilateral superficial sensory responses were normal for age. The H reflex responses were normal.  Autonomic testing: The sympathetic (galvanic) skin response in the left foot was normal to physiologic activation  Electromyography: Needle EMG of muscles of the right leg was performed. The iliopsoas, vastus medialis, tibialis anterior, peroneus longus were normal. There were some polyphasic motor units but normal recruitment in the gastrocnemius, gluteus medius and first dorsal interosseous of the foot muscles. No muscle had any abnormal spontaneous activity.  Impression: 1.    There was no electrophysiologic evidence of a sensory, motor or autonomic polyneuropathy. 2.     There was no significant radiculopathy.   A minimal S1 radiculopathy on the right cannot be ruled out.    Jayse Hodkinson A. Felecia Shelling, MD, PhD, FAAN Certified in Neurology, Clinical Neurophysiology, Sleep Medicine, Pain Medicine and Neuroimaging Director, [place] [facility] [address], [place], [place] [address] [phone]         [facility]    Nerve / Sites Muscle Latency Ref. Amplitude Ref. Rel Amp Segments Distance Velocity Ref. Area    ms ms mV mV %  cm m/s m/s mVms  L Peroneal - EDB     Ankle EDB 5.4 ?6.5 1.1 ?2.0 100 Ankle - EDB 9   2.4     Fib head EDB 13.8  1.0  90.3 Fib head - Ankle 37 44 ?44 2.7     Pop fossa EDB  15.6  0.7  66.1 Pop fossa - Fib head 10 55 ?44 1.6         Pop fossa - Ankle      R Peroneal - EDB     Ankle EDB 4.4 ?6.5 3.0 ?2.0 100 Ankle - EDB 9   8.7     Fib head EDB 11.5  2.7  89.5 Fib head - Ankle 36 51 ?44 8.0     Pop fossa EDB 13.2  2.6  96.8 Pop fossa - Fib head 10 58 ?44 9.0         Pop fossa - Ankle      L Tibial - AH     Ankle AH 4.0 ?5.8 9.8 ?4.0 100 Ankle - AH 9   25.9     Pop fossa AH 12.9  7.5  76.7 Pop fossa - Ankle 45 51 ?41 20.7  R Tibial - AH     Ankle AH 4.1 ?5.8 5.9 ?4.0 100 Ankle - AH 9   10.3     Pop fossa AH 12.8  3.9  65.9 Pop fossa - Ankle 45 52 ?41 10.9             SNC    Nerve / Sites Rec. Site Peak Lat Ref.  Amp Ref. Segments Distance    ms ms V V  cm  R Superficial peroneal - Ankle     Lat leg Ankle 3.3 ?4.4 4 ?  6 Lat leg - Ankle 14  L Superficial peroneal - Ankle     Lat leg Ankle 3.1 ?4.4 7 ?6 Lat leg - Ankle 14         H Reflex    Nerve H Lat Lat Hmax   ms ms   Left Right Ref. Left Right Ref.  Tibial - [place] 32.1 32.9 ?35.0 34.1 39.5 ?35.0         EMG       EMG full       EMG Summary Table    Spontaneous MUAP Recruitment  Muscle IA Fib PSW Fasc Other Amp Dur. Poly Pattern  L. Vastus medialis Normal None None None _______ Normal Normal Normal Normal  L. Tibialis anterior Normal None None None _______ Normal Normal Normal Normal  L. Peroneus longus Normal None None None _______ Normal Normal Normal Normal  L. Gastrocnemius (Medial head) Normal None None None _______ Normal Normal 1+ Normal  L. Dorsal interossei (pedis) Normal None None None _______ Normal Normal 1+ Normal  L. Gluteus medius Normal None None None _______ Normal Normal 1+ Normal  L. Iliopsoas Normal None None None _______ Normal Normal Normal Normal     Sympathetic skin response in the left foot was normal to physiologic activation

## 2017-01-28 ENCOUNTER — Ambulatory Visit: Payer: Medicare Other | Admitting: Physical Therapy

## 2017-01-28 ENCOUNTER — Encounter: Payer: Self-pay | Admitting: Physical Therapy

## 2017-01-28 DIAGNOSIS — M545 Low back pain, unspecified: Secondary | ICD-10-CM

## 2017-01-28 DIAGNOSIS — M6281 Muscle weakness (generalized): Secondary | ICD-10-CM

## 2017-01-28 DIAGNOSIS — M542 Cervicalgia: Secondary | ICD-10-CM

## 2017-01-28 NOTE — Therapy (Signed)
[place], [place], [address] Phone: [phone]   Fax:  [phone]  Physical Therapy Treatment  Patient Details  Name: Brooke Jimenez MRN: [id number] Date of Birth: [birth year] Referring Provider: Nelva Bush   Encounter Date: 01/28/2017  PT End of Session - 01/28/17 2993    Visit Number  16    Date for PT Re-Evaluation  02/16/17    PT Start Time  1530    PT Stop Time  1625    PT Time Calculation (min)  55 min    Activity Tolerance  Patient tolerated treatment well    Behavior During Therapy  [facility] for tasks assessed/performed       Past Medical History:  Diagnosis Date  . Acne rosacea   . Anxiety   . Arthritis    arthritis in hip   . Bronchiectasis ([place])   . Cataract    beginning  . Complication of anesthesia    "really sore throat"  . Corneal dystrophy   . DDD (degenerative disc disease), cervical    and lower back  . Diverticulosis   . Dysphagia    chronic- please see ultrasound done 4/16 15 in EPIC   . Dysrhythmia    sometimes patient has extra beats per patient   . Esophageal dysmotility   . Family history of adverse reaction to anesthesia    mother slow to wake up   . GERD (gastroesophageal reflux disease)    under control  . Glaucoma    "suspect"  . Gout    one finger x1 attack  . H/O hiatal hernia   . 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 wears hearing aides  . Heart murmur    no problem   . History of chicken pox   . History of melanoma    melanoma- 1986 and 1997   . Hypothyroidism   . Low back pain with sciatica   . Measles    hx of  . Mitral valve prolapse   . Nocturia   . Osteoporosis   . Peripheral neuropathy    feet  . PONV (postoperative nausea and vomiting)   . PTSD (post-traumatic stress disorder) 2006  . RSD (reflex sympathetic dystrophy) 1993  . Spider veins   . Urinary incontinence    occasional  . Varicose veins     Past Surgical  History:  Procedure Laterality Date  . ESOPHAGEAL MANOMETRY N/A 12/17/2015   Procedure: ESOPHAGEAL MANOMETRY (EM);  Surgeon: Mauri Pole, MD;  [facility];  Service: Endoscopy;  Laterality: N/A;  . HARDWARE REMOVAL Left 10/17/2013   Procedure: HARDWARE REMOVAL LEFT HIP;  Surgeon: Gearlean Alf, MD;  [facility];  Service: Orthopedics;  Laterality: Left;  . HIP FRACTURE SURGERY Left 2006   3 screws  . INSERTION OF MESH  02/08/2014   Procedure: INSERTION OF MESH;  Surgeon: Jackolyn Confer, MD;  [facility];  Service: General;;  . melanoma surgery   1986, 1997  . PARATHYROIDECTOMY  04/02/15  . [place] IMPEDANCE STUDY N/A 12/17/2015   Procedure: [place] IMPEDANCE STUDY;  Surgeon: Mauri Pole, MD;  [facility];  Service: Endoscopy;  Laterality: N/A;  . TOTAL HIP ARTHROPLASTY Left 04/10/2014   Procedure: LEFT TOTAL HIP ARTHROPLASTY ANTERIOR APPROACH;  Surgeon: Gearlean Alf, MD;  [facility];  Service: Orthopedics;  Laterality: Left;  . UMBILICAL HERNIA REPAIR N/A 02/08/2014   Procedure: HERNIA REPAIR  UMBILICAL ADULT WITH MESH;  Surgeon: Jackolyn Confer, MD;  [facility];  Service: General;  Laterality: N/A;    There were no vitals filed for this visit.  Subjective Assessment - 01/28/17 1530    Subjective  Patient reports that she had some right low back pain after the last treatment, but feels it was just soreness    Currently in Pain?  Yes    Pain Score  3     Pain Location  Back    Pain Orientation  Lower                      [facility] Adult PT Treatment/Exercise - 01/28/17 0001      High Level Balance   High Level Balance Activities  Tandem walking;Marching forwards    High Level Balance Comments  resisted gait all directions      Lumbar Exercises: Aerobic   Stationary Bike  Nustep L 5 6 min      Lumbar Exercises: Machines for Strengthening   Other Lumbar Machine Exercise  seated row and lat pull 20# 2 sets 12       Knee/Hip Exercises: Machines for Strengthening   Cybex Knee Extension  15# 2 sets 10    Cybex Knee Flexion  35# 2x10    Cybex Leg Press  50# 3x10, 20# 2x5 single legs    Other Machine  5# straight arm extension, scapular retraction, standing 5# hip extension and abduction      Knee/Hip Exercises: Standing   Other Standing Knee Exercises  ball push ups on wall with hip ext      Moist Heat Therapy   Number Minutes Moist Heat  15 Minutes    Moist Heat Location  Cervical;Lumbar Spine      Electrical Stimulation   Electrical Stimulation Location  lumbar     Electrical Stimulation Action  IFC    Electrical Stimulation Parameters  supine    Electrical Stimulation Goals  Pain               PT Short Term Goals - 01/02/17 1149      PT SHORT TERM GOAL #1   Title  independent with HEP    Status  Achieved        PT Long Term Goals - 01/28/17 1614      PT LONG TERM GOAL #2   Title  decrease pain 50%    Status  Partially Met      PT LONG TERM GOAL #3   Title  walk 100 feet without difficulty    Status  Achieved      PT LONG TERM GOAL #4   Title  increase ROM of the cervical and lumbar spine 25%    Status  Achieved            Plan - 01/28/17 1613    Clinical Impression Statement  Patient doing well, wants to know what she can do at home,  Needs core and hips strength    PT Next Visit Plan  start the education for after PT exercises at home vs gym    Consulted and Agree with Plan of Care  Patient       Patient will benefit from skilled therapeutic intervention in order to improve the following deficits and impairments:  Cardiopulmonary status limiting activity, Decreased activity tolerance, Decreased balance, Decreased mobility, Decreased strength, Improper body mechanics, Impaired flexibility, Pain, Increased muscle spasms, Difficulty walking, Decreased range of motion  Visit Diagnosis: Acute bilateral low back pain without sciatica  Muscle weakness  (generalized)  Cervicalgia     Problem List Patient Active Problem List   Diagnosis Date Noted  . Postherpetic neuralgia 12/25/2016  . Dysesthesia 12/25/2016  . Polyneuropathy 12/25/2016  . Exposure to influenza 04/01/2016  . Dysphagia   . Bronchiectasis without acute exacerbation ([place]) 08/30/2015  . Bronchiectasis ([place]) 08/14/2015  . Chronic rhinitis 08/14/2015  . Avascular necrosis of femur head, left ([place]) 04/10/2014  . OA (osteoarthritis) of hip 04/10/2014  . Painful orthopaedic hardware [facility]) 10/17/2013    Sumner Boast., PT 01/28/2017, 4:15 PM  [place], [place], [address] Phone: [phone]   Fax:  [phone]  Name: Yelitza Reach MRN: [id number] Date of Birth: [birth year]

## 2017-02-02 ENCOUNTER — Ambulatory Visit: Payer: Medicare Other | Admitting: Physical Therapy

## 2017-02-04 ENCOUNTER — Encounter: Payer: Self-pay | Admitting: Physical Therapy

## 2017-02-04 ENCOUNTER — Ambulatory Visit: Payer: Medicare Other | Admitting: Physical Therapy

## 2017-02-04 DIAGNOSIS — M545 Low back pain, unspecified: Secondary | ICD-10-CM

## 2017-02-04 DIAGNOSIS — M6281 Muscle weakness (generalized): Secondary | ICD-10-CM

## 2017-02-04 DIAGNOSIS — M542 Cervicalgia: Secondary | ICD-10-CM

## 2017-02-04 NOTE — Therapy (Signed)
[address] [place], [place], [address] Phone: [phone]   Fax:  [phone]  Physical Therapy Treatment  Patient Details  Name: Brooke Jimenez MRN: [id number] Date of Birth: [birth year] Referring Provider: Nelva Bush   Encounter Date: 02/04/2017  PT End of Session - 02/04/17 1426    Visit Number  17    Date for PT Re-Evaluation  02/16/17    PT Start Time  1315    PT Stop Time  1430    PT Time Calculation (min)  75 min    Activity Tolerance  Patient tolerated treatment well    Behavior During Therapy  [facility] for tasks assessed/performed       Past Medical History:  Diagnosis Date  . Acne rosacea   . Anxiety   . Arthritis    arthritis in hip   . Bronchiectasis ([place])   . Cataract    beginning  . Complication of anesthesia    "really sore throat"  . Corneal dystrophy   . DDD (degenerative disc disease), cervical    and lower back  . Diverticulosis   . Dysphagia    chronic- please see ultrasound done 4/16 15 in EPIC   . Dysrhythmia    sometimes patient has extra beats per patient   . Esophageal dysmotility   . Family history of adverse reaction to anesthesia    mother slow to wake up   . GERD (gastroesophageal reflux disease)    under control  . Glaucoma    "suspect"  . Gout    one finger x1 attack  . H/O hiatal hernia   . 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 wears hearing aides  . Heart murmur    no problem   . History of chicken pox   . History of melanoma    melanoma- 1986 and 1997   . Hypothyroidism   . Low back pain with sciatica   . Measles    hx of  . Mitral valve prolapse   . Nocturia   . Osteoporosis   . Peripheral neuropathy    feet  . PONV (postoperative nausea and vomiting)   . PTSD (post-traumatic stress disorder) 2006  . RSD (reflex sympathetic dystrophy) 1993  . Spider veins   . Urinary incontinence    occasional  . Varicose veins     Past Surgical  History:  Procedure Laterality Date  . ESOPHAGEAL MANOMETRY N/A 12/17/2015   Procedure: ESOPHAGEAL MANOMETRY (EM);  Surgeon: Mauri Pole, MD;  [facility];  Service: Endoscopy;  Laterality: N/A;  . HARDWARE REMOVAL Left 10/17/2013   Procedure: HARDWARE REMOVAL LEFT HIP;  Surgeon: Gearlean Alf, MD;  [facility];  Service: Orthopedics;  Laterality: Left;  . HIP FRACTURE SURGERY Left 2006   3 screws  . INSERTION OF MESH  02/08/2014   Procedure: INSERTION OF MESH;  Surgeon: Jackolyn Confer, MD;  [facility];  Service: General;;  . melanoma surgery   1986, 1997  . PARATHYROIDECTOMY  04/02/15  . [place] IMPEDANCE STUDY N/A 12/17/2015   Procedure: [place] IMPEDANCE STUDY;  Surgeon: Mauri Pole, MD;  [facility];  Service: Endoscopy;  Laterality: N/A;  . TOTAL HIP ARTHROPLASTY Left 04/10/2014   Procedure: LEFT TOTAL HIP ARTHROPLASTY ANTERIOR APPROACH;  Surgeon: Gearlean Alf, MD;  [facility];  Service: Orthopedics;  Laterality: Left;  . UMBILICAL HERNIA REPAIR N/A 02/08/2014   Procedure: HERNIA REPAIR  UMBILICAL ADULT WITH MESH;  Surgeon: Jackolyn Confer, MD;  [facility];  Service: General;  Laterality: N/A;    There were no vitals filed for this visit.  Subjective Assessment - 02/04/17 1313    Subjective  Patient reports that she had MRI last Friday, she will get results this Friday.  She reports soreness but that she can do more than she thinks when she comes in.    Currently in Pain?  Yes    Pain Score  3     Pain Location  Back    Pain Descriptors / Indicators  Aching                      [facility] Adult PT Treatment/Exercise - 02/04/17 0001      Neck Exercises: Standing   Other Standing Exercises  corner stretch      Lumbar Exercises: Aerobic   Stationary Bike  Nustep L 5 6 min      Lumbar Exercises: Machines for Strengthening   Other Lumbar Machine Exercise  seated row and lat pull 25# 2 sets 12    Other Lumbar Machine  Exercise  5# hip abductiona nd extension      Knee/Hip Exercises: Stretches   Passive Hamstring Stretch  4 reps;20 seconds    Piriformis Stretch  4 reps;20 seconds      Knee/Hip Exercises: Machines for Strengthening   Cybex Knee Extension  15# 2 sets 10    Cybex Knee Flexion  35# 2x10    Cybex Leg Press  50# 3x10, 20# 2x5 single legs    Other Machine  10# straight arm extension, scapular retraction, standing 5# hip extension and abduction      Knee/Hip Exercises: Supine   Other Supine Knee/Hip Exercises  feet on ball K2C, trunk rotation, small bridges and isometric abdominals      Moist Heat Therapy   Number Minutes Moist Heat  15 Minutes    Moist Heat Location  Cervical;Lumbar Spine      Electrical Stimulation   Electrical Stimulation Location  lumbar     Electrical Stimulation Action  IFC    Electrical Stimulation Parameters  supine    Electrical Stimulation Goals  Pain               PT Short Term Goals - 01/02/17 1149      PT SHORT TERM GOAL #1   Title  independent with HEP    Status  Achieved        PT Long Term Goals - 02/04/17 1430      PT LONG TERM GOAL #2   Title  decrease pain 50%    Status  Partially Met            Plan - 02/04/17 1427    Clinical Impression Statement  Patient does very well with the exercises needs some time to rest in between exercises, has no increase of pain with the exercise.  She is weak in the hips and core, she is tight in the left HS and piriformis.      PT Next Visit Plan  She will get the results of MRI on Friday, can change treatment if needed, if not will advance to independent gym    Consulted and Agree with Plan of Care  Patient       Patient will benefit from skilled therapeutic intervention in order to improve the following deficits and impairments:  Cardiopulmonary status limiting activity, Decreased activity  tolerance, Decreased balance, Decreased mobility, Decreased strength, Improper body mechanics,  Impaired flexibility, Pain, Increased muscle spasms, Difficulty walking, Decreased range of motion  Visit Diagnosis: Acute bilateral low back pain without sciatica  Muscle weakness (generalized)  Cervicalgia     Problem List Patient Active Problem List   Diagnosis Date Noted  . Postherpetic neuralgia 12/25/2016  . Dysesthesia 12/25/2016  . Polyneuropathy 12/25/2016  . Exposure to influenza 04/01/2016  . Dysphagia   . Bronchiectasis without acute exacerbation ([place]) 08/30/2015  . Bronchiectasis ([place]) 08/14/2015  . Chronic rhinitis 08/14/2015  . Avascular necrosis of femur head, left ([place]) 04/10/2014  . OA (osteoarthritis) of hip 04/10/2014  . Painful orthopaedic hardware [facility]) 10/17/2013    Sumner Boast., PT 02/04/2017, 2:31 PM  [facility] [address], [place], [address] Phone: [phone]   Fax:  [phone]  Name: Lakynn Halvorsen MRN: [id number] Date of Birth: March 26, 1945

## 2017-02-06 ENCOUNTER — Ambulatory Visit: Payer: Medicare Other | Admitting: Physical Therapy

## 2017-02-06 ENCOUNTER — Encounter: Payer: Self-pay | Admitting: Physical Therapy

## 2017-02-06 DIAGNOSIS — M545 Low back pain, unspecified: Secondary | ICD-10-CM

## 2017-02-06 DIAGNOSIS — M542 Cervicalgia: Secondary | ICD-10-CM

## 2017-02-06 DIAGNOSIS — M6281 Muscle weakness (generalized): Secondary | ICD-10-CM

## 2017-02-06 NOTE — Therapy (Signed)
[address] [place], [place], [address] Phone: [phone]   Fax:  [phone]  Physical Therapy Treatment  Patient Details  Name: Brooke Jimenez MRN: [id number] Date of Birth: 06-28-1945 Referring Provider: Nelva Bush   Encounter Date: 02/06/2017  PT End of Session - 02/06/17 1150    Visit Number  18    Date for PT Re-Evaluation  02/16/17    PT Start Time  1102    PT Stop Time  1203    PT Time Calculation (min)  61 min    Activity Tolerance  Patient tolerated treatment well    Behavior During Therapy  [facility] for tasks assessed/performed       Past Medical History:  Diagnosis Date  . Acne rosacea   . Anxiety   . Arthritis    arthritis in hip   . Bronchiectasis ([place])   . Cataract    beginning  . Complication of anesthesia    "really sore throat"  . Corneal dystrophy   . DDD (degenerative disc disease), cervical    and lower back  . Diverticulosis   . Dysphagia    chronic- please see ultrasound done 4/16 15 in EPIC   . Dysrhythmia    sometimes patient has extra beats per patient   . Esophageal dysmotility   . Family history of adverse reaction to anesthesia    mother slow to wake up   . GERD (gastroesophageal reflux disease)    under control  . Glaucoma    "suspect"  . Gout    one finger x1 attack  . H/O hiatal hernia   . 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 wears hearing aides  . Heart murmur    no problem   . History of chicken pox   . History of melanoma    melanoma- 1986 and 1997   . Hypothyroidism   . Low back pain with sciatica   . Measles    hx of  . Mitral valve prolapse   . Nocturia   . Osteoporosis   . Peripheral neuropathy    feet  . PONV (postoperative nausea and vomiting)   . PTSD (post-traumatic stress disorder) 2006  . RSD (reflex sympathetic dystrophy) 1993  . Spider veins   . Urinary incontinence    occasional  . Varicose veins     Past Surgical  History:  Procedure Laterality Date  . ESOPHAGEAL MANOMETRY N/A 12/17/2015   Procedure: ESOPHAGEAL MANOMETRY (EM);  Surgeon: Mauri Pole, MD;  [facility];  Service: Endoscopy;  Laterality: N/A;  . HARDWARE REMOVAL Left 10/17/2013   Procedure: HARDWARE REMOVAL LEFT HIP;  Surgeon: Gearlean Alf, MD;  [facility];  Service: Orthopedics;  Laterality: Left;  . HIP FRACTURE SURGERY Left 2006   3 screws  . INSERTION OF MESH  02/08/2014   Procedure: INSERTION OF MESH;  Surgeon: Jackolyn Confer, MD;  [facility];  Service: General;;  . melanoma surgery   1986, 1997  . PARATHYROIDECTOMY  04/02/15  . [place] IMPEDANCE STUDY N/A 12/17/2015   Procedure: [place] IMPEDANCE STUDY;  Surgeon: Mauri Pole, MD;  [facility];  Service: Endoscopy;  Laterality: N/A;  . TOTAL HIP ARTHROPLASTY Left 04/10/2014   Procedure: LEFT TOTAL HIP ARTHROPLASTY ANTERIOR APPROACH;  Surgeon: Gearlean Alf, MD;  [facility];  Service: Orthopedics;  Laterality: Left;  . UMBILICAL HERNIA REPAIR N/A 02/08/2014   Procedure: HERNIA REPAIR  UMBILICAL ADULT WITH MESH;  Surgeon: Jackolyn Confer, MD;  [facility];  Service: General;  Laterality: N/A;    There were no vitals filed for this visit.  Subjective Assessment - 02/06/17 1106    Subjective  Pt reports that she sees the MD today to get her MRI results.     Currently in Pain?  Yes    Pain Score  3     Pain Location  Back                      [facility] Adult PT Treatment/Exercise - 02/06/17 0001      Lumbar Exercises: Aerobic   Stationary Bike  Nustep L 5 6 min      Lumbar Exercises: Machines for Strengthening   Other Lumbar Machine Exercise  seated row and lat pull 25# 2 sets 12    Other Lumbar Machine Exercise  5# hip abductiona nd extension      Knee/Hip Exercises: Machines for Strengthening   Cybex Knee Extension  15# 2 sets 10    Cybex Knee Flexion  35# 2x10    Cybex Leg Press  50# 3x10, 20# 2x5 single legs       Moist Heat Therapy   Number Minutes Moist Heat  15 Minutes    Moist Heat Location  Cervical;Lumbar Spine      Electrical Stimulation   Electrical Stimulation Location  lumbar     Electrical Stimulation Action  IFC    Electrical Stimulation Parameters  supine    Electrical Stimulation Goals  Pain               PT Short Term Goals - 01/02/17 1149      PT SHORT TERM GOAL #1   Title  independent with HEP    Status  Achieved        PT Long Term Goals - 02/04/17 1430      PT LONG TERM GOAL #2   Title  decrease pain 50%    Status  Partially Met            Plan - 02/06/17 1153    Clinical Impression Statement  Continues to do well with exercises, she does fatigue quick with elliptical. No reports of increase pain. Increase time needed between exercises to rest.     PT Treatment/Interventions  ADLs/Self Care Home Management;Cryotherapy;Electrical Stimulation;Moist Heat;Ultrasound;Traction;Therapeutic activities;Therapeutic exercise;Gait training;Balance training;Patient/family education;Manual techniques    PT Next Visit Plan  She will get the results of MRI today, can change treatment if needed, if not will advance to independent gym       Patient will benefit from skilled therapeutic intervention in order to improve the following deficits and impairments:  Cardiopulmonary status limiting activity, Decreased activity tolerance, Decreased balance, Decreased mobility, Decreased strength, Improper body mechanics, Impaired flexibility, Pain, Increased muscle spasms, Difficulty walking, Decreased range of motion  Visit Diagnosis: Acute bilateral low back pain without sciatica  Muscle weakness (generalized)  Cervicalgia     Problem List Patient Active Problem List   Diagnosis Date Noted  . Postherpetic neuralgia 12/25/2016  . Dysesthesia 12/25/2016  . Polyneuropathy 12/25/2016  . Exposure to influenza 04/01/2016  . Dysphagia   . Bronchiectasis without  acute exacerbation ([place]) 08/30/2015  . Bronchiectasis ([place]) 08/14/2015  . Chronic rhinitis 08/14/2015  . Avascular necrosis of femur head, left ([place]) 04/10/2014  . OA (osteoarthritis) of hip 04/10/2014  . Painful orthopaedic hardware ([place]) 10/17/2013    Scot Jun,  PTA 02/06/2017, 11:58 AM  [place], [place], [address] Phone: [phone]   Fax:  [phone]  Name: Brooke Jimenez MRN: [id number] Date of Birth: October 28, 1945

## 2017-02-09 ENCOUNTER — Ambulatory Visit: Payer: Medicare Other | Admitting: Physical Therapy

## 2017-02-09 ENCOUNTER — Encounter: Payer: Self-pay | Admitting: Physical Therapy

## 2017-02-09 DIAGNOSIS — M545 Low back pain, unspecified: Secondary | ICD-10-CM

## 2017-02-09 DIAGNOSIS — M6281 Muscle weakness (generalized): Secondary | ICD-10-CM

## 2017-02-09 DIAGNOSIS — M542 Cervicalgia: Secondary | ICD-10-CM

## 2017-02-09 NOTE — Therapy (Signed)
[address] [place], [place], [address] Phone: [phone]   Fax:  [phone]  Physical Therapy Treatment  Patient Details  Name: Brooke Jimenez MRN: [id number] Date of Birth: October 10, 1945 Referring Provider: Nelva Bush   Encounter Date: 02/09/2017  PT End of Session - 02/09/17 1746    Visit Number  19    Date for PT Re-Evaluation  02/16/17    PT Start Time  2683    PT Stop Time  1616    PT Time Calculation (min)  41 min    Activity Tolerance  Patient tolerated treatment well    Behavior During Therapy  [facility] for tasks assessed/performed       Past Medical History:  Diagnosis Date  . Acne rosacea   . Anxiety   . Arthritis    arthritis in hip   . Bronchiectasis ([place])   . Cataract    beginning  . Complication of anesthesia    "really sore throat"  . Corneal dystrophy   . DDD (degenerative disc disease), cervical    and lower back  . Diverticulosis   . Dysphagia    chronic- please see ultrasound done 4/16 15 in EPIC   . Dysrhythmia    sometimes patient has extra beats per patient   . Esophageal dysmotility   . Family history of adverse reaction to anesthesia    mother slow to wake up   . GERD (gastroesophageal reflux disease)    under control  . Glaucoma    "suspect"  . Gout    one finger x1 attack  . H/O hiatal hernia   . 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 wears hearing aides  . Heart murmur    no problem   . History of chicken pox   . History of melanoma    melanoma- 1986 and 1997   . Hypothyroidism   . Low back pain with sciatica   . Measles    hx of  . Mitral valve prolapse   . Nocturia   . Osteoporosis   . Peripheral neuropathy    feet  . PONV (postoperative nausea and vomiting)   . PTSD (post-traumatic stress disorder) 2006  . RSD (reflex sympathetic dystrophy) 1993  . Spider veins   . Urinary incontinence    occasional  . Varicose veins     Past Surgical  History:  Procedure Laterality Date  . ESOPHAGEAL MANOMETRY N/A 12/17/2015   Procedure: ESOPHAGEAL MANOMETRY (EM);  Surgeon: Mauri Pole, MD;  [facility];  Service: Endoscopy;  Laterality: N/A;  . HARDWARE REMOVAL Left 10/17/2013   Procedure: HARDWARE REMOVAL LEFT HIP;  Surgeon: Gearlean Alf, MD;  [facility];  Service: Orthopedics;  Laterality: Left;  . HIP FRACTURE SURGERY Left 2006   3 screws  . INSERTION OF MESH  02/08/2014   Procedure: INSERTION OF MESH;  Surgeon: Jackolyn Confer, MD;  [facility];  Service: General;;  . melanoma surgery   1986, 1997  . PARATHYROIDECTOMY  04/02/15  . [place] IMPEDANCE STUDY N/A 12/17/2015   Procedure: [place] IMPEDANCE STUDY;  Surgeon: Mauri Pole, MD;  [facility];  Service: Endoscopy;  Laterality: N/A;  . TOTAL HIP ARTHROPLASTY Left 04/10/2014   Procedure: LEFT TOTAL HIP ARTHROPLASTY ANTERIOR APPROACH;  Surgeon: Gearlean Alf, MD;  [facility];  Service: Orthopedics;  Laterality: Left;  . UMBILICAL HERNIA REPAIR N/A 02/08/2014   Procedure: HERNIA REPAIR  UMBILICAL ADULT WITH MESH;  Surgeon: Jackolyn Confer, MD;  [facility];  Service: General;  Laterality: N/A;    There were no vitals filed for this visit.  Subjective Assessment - 02/09/17 1700    Subjective  Patient has results of MRI, small stenosis and DDD in the lumbar and cervcial area    Currently in Pain?  Yes    Pain Score  1     Pain Location  Back                      [facility] Adult PT Treatment/Exercise - 02/09/17 0001      Self-Care   Self-Care  Other Self-Care Comments    Other Self-Care Comments   review of MRI with her, went ove ranatomy and what her exercising can do for the diagnosis      Lumbar Exercises: Aerobic   Stationary Bike  Nustep L 5 6 min      Lumbar Exercises: Machines for Strengthening   Other Lumbar Machine Exercise  seated row and lat pull 25# 2 sets 12    Other Lumbar Machine Exercise  5# hip  abductiona nd extension      Knee/Hip Exercises: Machines for Strengthening   Other Machine  10# straight arm extension, scapular retraction, standing 5# hip extension and abduction      Knee/Hip Exercises: Standing   Other Standing Knee Exercises  ball push ups on wall with hip ext    Other Standing Knee Exercises  ball wall squats      Knee/Hip Exercises: Supine   Other Supine Knee/Hip Exercises  feet on ball K2C, trunk rotation, small bridges and isometric abdominals               PT Short Term Goals - 01/02/17 1149      PT SHORT TERM GOAL #1   Title  independent with HEP    Status  Achieved        PT Long Term Goals - 02/04/17 1430      PT LONG TERM GOAL #2   Title  decrease pain 50%    Status  Partially Met            Plan - 02/09/17 1746    Clinical Impression Statement  patient needing instruction in how to do things at home as she reports that seh will not go to a gym, she brought in a video and wants a good handout and pictures of what to do at home    PT Next Visit Plan  work on her HEP with pix and instruction    Consulted and Agree with Plan of Care  Patient       Patient will benefit from skilled therapeutic intervention in order to improve the following deficits and impairments:  Cardiopulmonary status limiting activity, Decreased activity tolerance, Decreased balance, Decreased mobility, Decreased strength, Improper body mechanics, Impaired flexibility, Pain, Increased muscle spasms, Difficulty walking, Decreased range of motion  Visit Diagnosis: Acute bilateral low back pain without sciatica  Muscle weakness (generalized)  Cervicalgia     Problem List Patient Active Problem List   Diagnosis Date Noted  . Postherpetic neuralgia 12/25/2016  . Dysesthesia 12/25/2016  . Polyneuropathy 12/25/2016  . Exposure to influenza 04/01/2016  . Dysphagia   . Bronchiectasis without acute exacerbation ([place]) 08/30/2015  . Bronchiectasis ([place])  08/14/2015  . Chronic rhinitis 08/14/2015  . Avascular necrosis of femur head, left ([place]) 04/10/2014  . OA (osteoarthritis) of  hip 04/10/2014  . Painful orthopaedic hardware [facility]) 10/17/2013    Sumner Boast., PT 02/09/2017, 5:49 PM  [place], [place], [address] Phone: [phone]   Fax:  [phone]  Name: Tia Hieronymus MRN: [id number] Date of Birth: 1945/12/04

## 2017-02-11 ENCOUNTER — Telehealth: Payer: Self-pay | Admitting: Pulmonary Disease

## 2017-02-11 ENCOUNTER — Ambulatory Visit: Payer: Medicare Other | Admitting: Physical Therapy

## 2017-02-11 ENCOUNTER — Encounter: Payer: Self-pay | Admitting: Physical Therapy

## 2017-02-11 DIAGNOSIS — J479 Bronchiectasis, uncomplicated: Secondary | ICD-10-CM

## 2017-02-11 DIAGNOSIS — M545 Low back pain, unspecified: Secondary | ICD-10-CM

## 2017-02-11 DIAGNOSIS — M542 Cervicalgia: Secondary | ICD-10-CM

## 2017-02-11 DIAGNOSIS — M6281 Muscle weakness (generalized): Secondary | ICD-10-CM

## 2017-02-11 NOTE — Therapy (Signed)
[address] [place], [place], [address] Phone: [phone]   Fax:  [phone]  Physical Therapy Treatment  Patient Details  Name: Brooke Jimenez MRN: [id number] Date of Birth: [birth year] Referring Provider: Nelva Bush   Encounter Date: 02/11/2017  PT End of Session - 02/11/17 1630    Visit Number  20    Date for PT Re-Evaluation  02/16/17    PT Start Time  1530    PT Stop Time  1629    PT Time Calculation (min)  59 min    Activity Tolerance  Patient tolerated treatment well    Behavior During Therapy  [facility] for tasks assessed/performed       Past Medical History:  Diagnosis Date  . Acne rosacea   . Anxiety   . Arthritis    arthritis in hip   . Bronchiectasis ([place])   . Cataract    beginning  . Complication of anesthesia    "really sore throat"  . Corneal dystrophy   . DDD (degenerative disc disease), cervical    and lower back  . Diverticulosis   . Dysphagia    chronic- please see ultrasound done 4/16 15 in EPIC   . Dysrhythmia    sometimes patient has extra beats per patient   . Esophageal dysmotility   . Family history of adverse reaction to anesthesia    mother slow to wake up   . GERD (gastroesophageal reflux disease)    under control  . Glaucoma    "suspect"  . Gout    one finger x1 attack  . H/O hiatal hernia   . 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 wears hearing aides  . Heart murmur    no problem   . History of chicken pox   . History of melanoma    melanoma- 1986 and 1997   . Hypothyroidism   . Low back pain with sciatica   . Measles    hx of  . Mitral valve prolapse   . Nocturia   . Osteoporosis   . Peripheral neuropathy    feet  . PONV (postoperative nausea and vomiting)   . PTSD (post-traumatic stress disorder) 2006  . RSD (reflex sympathetic dystrophy) 1993  . Spider veins   . Urinary incontinence    occasional  . Varicose veins     Past Surgical  History:  Procedure Laterality Date  . ESOPHAGEAL MANOMETRY N/A 12/17/2015   Procedure: ESOPHAGEAL MANOMETRY (EM);  Surgeon: Mauri Pole, MD;  [facility];  Service: Endoscopy;  Laterality: N/A;  . HARDWARE REMOVAL Left 10/17/2013   Procedure: HARDWARE REMOVAL LEFT HIP;  Surgeon: Gearlean Alf, MD;  [facility];  Service: Orthopedics;  Laterality: Left;  . HIP FRACTURE SURGERY Left 2006   3 screws  . INSERTION OF MESH  02/08/2014   Procedure: INSERTION OF MESH;  Surgeon: Jackolyn Confer, MD;  [facility];  Service: General;;  . melanoma surgery   1986, 1997  . PARATHYROIDECTOMY  04/02/15  . [place] IMPEDANCE STUDY N/A 12/17/2015   Procedure: [place] IMPEDANCE STUDY;  Surgeon: Mauri Pole, MD;  [facility];  Service: Endoscopy;  Laterality: N/A;  . TOTAL HIP ARTHROPLASTY Left 04/10/2014   Procedure: LEFT TOTAL HIP ARTHROPLASTY ANTERIOR APPROACH;  Surgeon: Gearlean Alf, MD;  [facility];  Service: Orthopedics;  Laterality: Left;  . UMBILICAL HERNIA REPAIR N/A 02/08/2014   Procedure: HERNIA REPAIR  UMBILICAL ADULT WITH MESH;  Surgeon: Jackolyn Confer, MD;  [facility];  Service: General;  Laterality: N/A;    There were no vitals filed for this visit.  Subjective Assessment - 02/28/2017 1534    Subjective  Patient feels like she would need a good workout today as shge feels like she is going to be discahrged and wants to do the pix and video of her HEP next visit    Currently in Pain?  Yes    Pain Score  2     Pain Location  Back    Pain Orientation  Lower                      [facility] Adult PT Treatment/Exercise - February 28, 2017 0001      Therapeutic Activites    Therapeutic Activities  Other Therapeutic Activities    Other Therapeutic Activities  posture and body mechanics education on ADL's, lifting, bending, golfer's lift      Lumbar Exercises: Stretches   Piriformis Stretch  4 reps;20 seconds      Knee/Hip Exercises: Machines  for Strengthening   Cybex Knee Extension  15# 2 sets 10    Cybex Knee Flexion  35# 2x10    Cybex Leg Press  50# 3x10, 20# 2x5 single legs    Other Machine  10# straight arm extension, scapular retraction, standing 5# hip extension and abduction, 25# seated row and lats      Knee/Hip Exercises: Supine   Other Supine Knee/Hip Exercises  feet on ball K2C, trunk rotation, small bridges and isometric abdominals               PT Short Term Goals - 01/02/17 1149      PT SHORT TERM GOAL #1   Title  independent with HEP    Status  Achieved        PT Long Term Goals - 02/04/17 1430      PT LONG TERM GOAL #2   Title  decrease pain 50%    Status  Partially Met            Plan - 02-28-17 1631    Clinical Impression Statement  Gave two handouts today about tband for LE's and scapular stabilization, she is to try this and then the next visit will go over her advanced HEP, she wants pictures of her doing the exercises    PT Next Visit Plan  work on her HEP with pix and instruction    Consulted and Agree with Plan of Care  Patient       Patient will benefit from skilled therapeutic intervention in order to improve the following deficits and impairments:  Cardiopulmonary status limiting activity, Decreased activity tolerance, Decreased balance, Decreased mobility, Decreased strength, Improper body mechanics, Impaired flexibility, Pain, Increased muscle spasms, Difficulty walking, Decreased range of motion  Visit Diagnosis: Acute bilateral low back pain without sciatica  Muscle weakness (generalized)  Cervicalgia   G-Codes - Feb 28, 2017 1632    Functional Assessment Tool Used (Outpatient Only)  FOTO 41% limitation    Functional Limitation  Mobility: Walking and moving around    Mobility: Walking and Moving Around Current Status (I6270)  At least 40 percent but less than 60 percent impaired, limited or restricted    Mobility: Walking and Moving Around Goal Status (J5009)  At  least 20 percent but less than 40 percent impaired, limited or restricted       Problem List Patient Active Problem  List   Diagnosis Date Noted  . Postherpetic neuralgia 12/25/2016  . Dysesthesia 12/25/2016  . Polyneuropathy 12/25/2016  . Exposure to influenza 04/01/2016  . Dysphagia   . Bronchiectasis without acute exacerbation ([place]) 08/30/2015  . Bronchiectasis ([place]) 08/14/2015  . Chronic rhinitis 08/14/2015  . Avascular necrosis of femur head, left ([place]) 04/10/2014  . OA (osteoarthritis) of hip 04/10/2014  . Painful orthopaedic hardware [facility]) 10/17/2013    Sumner Boast., PT 02/11/2017, 4:34 PM  [place] [place], [place], [address] Phone: [phone]   Fax:  [phone]  Name: Brooke Jimenez MRN: [id number] Date of Birth: 26-Aug-1945

## 2017-02-11 NOTE — Telephone Encounter (Signed)
lmtcb for Lisa at [employer]

## 2017-02-12 NOTE — Telephone Encounter (Signed)
OK by me 

## 2017-02-12 NOTE — Telephone Encounter (Signed)
Pt is interested in starting pulm rehab maintenance program.  BQ please advise if ok to order.  Thanks!

## 2017-02-12 NOTE — Telephone Encounter (Signed)
Order has been placed. Nothing further was needed. 

## 2017-02-13 ENCOUNTER — Encounter: Payer: Self-pay | Admitting: Physical Therapy

## 2017-02-13 ENCOUNTER — Ambulatory Visit: Payer: Medicare Other | Admitting: Physical Therapy

## 2017-02-13 DIAGNOSIS — M545 Low back pain, unspecified: Secondary | ICD-10-CM

## 2017-02-13 DIAGNOSIS — M542 Cervicalgia: Secondary | ICD-10-CM

## 2017-02-13 DIAGNOSIS — M6281 Muscle weakness (generalized): Secondary | ICD-10-CM

## 2017-02-13 NOTE — Therapy (Signed)
[address] [place], [place], [address] Phone: [phone]   Fax:  [phone]  Physical Therapy Treatment  Patient Details  Name: Brooke Jimenez MRN: [id number] Date of Birth: 1946/02/09 Referring Provider: Nelva Bush   Encounter Date: 02/13/2017  PT End of Session - 02/13/17 1103    Visit Number  21    Date for PT Re-Evaluation  02/16/17    PT Start Time  1008    PT Stop Time  1055    PT Time Calculation (min)  47 min    Activity Tolerance  Patient tolerated treatment well    Behavior During Therapy  [facility] for tasks assessed/performed       Past Medical History:  Diagnosis Date  . Acne rosacea   . Anxiety   . Arthritis    arthritis in hip   . Bronchiectasis ([place])   . Cataract    beginning  . Complication of anesthesia    "really sore throat"  . Corneal dystrophy   . DDD (degenerative disc disease), cervical    and lower back  . Diverticulosis   . Dysphagia    chronic- please see ultrasound done 4/16 15 in EPIC   . Dysrhythmia    sometimes patient has extra beats per patient   . Esophageal dysmotility   . Family history of adverse reaction to anesthesia    mother slow to wake up   . GERD (gastroesophageal reflux disease)    under control  . Glaucoma    "suspect"  . Gout    one finger x1 attack  . H/O hiatal hernia   . 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 wears hearing aides  . Heart murmur    no problem   . History of chicken pox   . History of melanoma    melanoma- 1986 and 1997   . Hypothyroidism   . Low back pain with sciatica   . Measles    hx of  . Mitral valve prolapse   . Nocturia   . Osteoporosis   . Peripheral neuropathy    feet  . PONV (postoperative nausea and vomiting)   . PTSD (post-traumatic stress disorder) 2006  . RSD (reflex sympathetic dystrophy) 1993  . Spider veins   . Urinary incontinence    occasional  . Varicose veins     Past Surgical  History:  Procedure Laterality Date  . ESOPHAGEAL MANOMETRY N/A 12/17/2015   Procedure: ESOPHAGEAL MANOMETRY (EM);  Surgeon: Mauri Pole, MD;  [facility];  Service: Endoscopy;  Laterality: N/A;  . HARDWARE REMOVAL Left 10/17/2013   Procedure: HARDWARE REMOVAL LEFT HIP;  Surgeon: Gearlean Alf, MD;  [facility];  Service: Orthopedics;  Laterality: Left;  . HIP FRACTURE SURGERY Left 2006   3 screws  . INSERTION OF MESH  02/08/2014   Procedure: INSERTION OF MESH;  Surgeon: Jackolyn Confer, MD;  [facility];  Service: General;;  . melanoma surgery   1986, 1997  . PARATHYROIDECTOMY  04/02/15  . [place] IMPEDANCE STUDY N/A 12/17/2015   Procedure: [place] IMPEDANCE STUDY;  Surgeon: Mauri Pole, MD;  [facility];  Service: Endoscopy;  Laterality: N/A;  . TOTAL HIP ARTHROPLASTY Left 04/10/2014   Procedure: LEFT TOTAL HIP ARTHROPLASTY ANTERIOR APPROACH;  Surgeon: Gearlean Alf, MD;  [facility];  Service: Orthopedics;  Laterality: Left;  . UMBILICAL HERNIA REPAIR N/A 02/08/2014   Procedure: HERNIA REPAIR  UMBILICAL ADULT WITH MESH;  Surgeon: Jackolyn Confer, MD;  [facility];  Service: General;  Laterality: N/A;    There were no vitals filed for this visit.  Subjective Assessment - 02/13/17 1021    Subjective  Patient reports that she was up until 1:30 last night moving furniture as she is having some furniture delivered today.  She is tired and sore, wants to "go light today"    Currently in Pain?  Yes    Pain Score  4     Pain Location  Back    Pain Orientation  Lower    Aggravating Factors   moving furniture last night                      [facility] Adult PT Treatment/Exercise - 02/13/17 0001      Lumbar Exercises: Aerobic   Stationary Bike  Nustep L 5 8 min      Lumbar Exercises: Machines for Strengthening   Other Lumbar Machine Exercise  seated row and lat pull 25# 2 sets 12    Other Lumbar Machine Exercise  5# hip abductiona  nd extension      Knee/Hip Exercises: Stretches   Passive Hamstring Stretch  4 reps;20 seconds    Piriformis Stretch  4 reps;20 seconds    Other Knee/Hip Stretches  lumbar rotation ande sidebending stretches      Knee/Hip Exercises: Machines for Strengthening   Cybex Knee Extension  15# 2 sets 10    Cybex Knee Flexion  35# 2x10    Cybex Leg Press  50# 3x10, 20# 2x5 single legs      Knee/Hip Exercises: Supine   Other Supine Knee/Hip Exercises  feet on ball K2C, trunk rotation, small bridges and isometric abdominals               PT Short Term Goals - 01/02/17 1149      PT SHORT TERM GOAL #1   Title  independent with HEP    Status  Achieved        PT Long Term Goals - 02/04/17 1430      PT LONG TERM GOAL #2   Title  decrease pain 50%    Status  Partially Met            Plan - 02/13/17 1104    Clinical Impression Statement  Patient with some icreassed pain and soreness after moving furntitue, she was tired and a little tighter in the mms    PT Next Visit Plan  work on her HEP with pix and instruction    Consulted and Agree with Plan of Care  Patient       Patient will benefit from skilled therapeutic intervention in order to improve the following deficits and impairments:  Cardiopulmonary status limiting activity, Decreased activity tolerance, Decreased balance, Decreased mobility, Decreased strength, Improper body mechanics, Impaired flexibility, Pain, Increased muscle spasms, Difficulty walking, Decreased range of motion  Visit Diagnosis: Acute bilateral low back pain without sciatica  Muscle weakness (generalized)  Cervicalgia     Problem List Patient Active Problem List   Diagnosis Date Noted  . Postherpetic neuralgia 12/25/2016  . Dysesthesia 12/25/2016  . Polyneuropathy 12/25/2016  . Exposure to influenza 04/01/2016  . Dysphagia   . Bronchiectasis without acute exacerbation ([place]) 08/30/2015  . Bronchiectasis ([place]) 08/14/2015  . Chronic  rhinitis 08/14/2015  . Avascular necrosis of femur head, left ([place]) 04/10/2014  . OA (osteoarthritis) of hip 04/10/2014  .  Painful orthopaedic hardware [facility]) 10/17/2013    Sumner Boast., PT 02/13/2017, 11:05 AM  [place] [address] [place], [place], [address] Phone: [phone]   Fax:  [phone]  Name: Darnette Lampron MRN: [id number] Date of Birth: Mar 29, 1945

## 2017-02-16 ENCOUNTER — Encounter: Payer: Self-pay | Admitting: Physical Therapy

## 2017-02-16 ENCOUNTER — Ambulatory Visit: Payer: Medicare Other | Admitting: Physical Therapy

## 2017-02-16 DIAGNOSIS — M542 Cervicalgia: Secondary | ICD-10-CM

## 2017-02-16 DIAGNOSIS — M545 Low back pain, unspecified: Secondary | ICD-10-CM

## 2017-02-16 DIAGNOSIS — M6281 Muscle weakness (generalized): Secondary | ICD-10-CM

## 2017-02-16 NOTE — Therapy (Signed)
[address] [address] [place], [place], [address] Phone: [phone]   Fax:  [phone]  Physical Therapy Treatment  Patient Details  Name: Brooke Jimenez MRN: [id number] Date of Birth: Mar 07, 1945 Referring Provider: Nelva Bush   Encounter Date: 02/16/2017  PT End of Session - 02/16/17 1154    Visit Number  22    PT Start Time  1100    PT Stop Time  1147    PT Time Calculation (min)  47 min    Activity Tolerance  Patient tolerated treatment well    Behavior During Therapy  [facility] for tasks assessed/performed       Past Medical History:  Diagnosis Date  . Acne rosacea   . Anxiety   . Arthritis    arthritis in hip   . Bronchiectasis ([place])   . Cataract    beginning  . Complication of anesthesia    "really sore throat"  . Corneal dystrophy   . DDD (degenerative disc disease), cervical    and lower back  . Diverticulosis   . Dysphagia    chronic- please see ultrasound done 4/16 15 in EPIC   . Dysrhythmia    sometimes patient has extra beats per patient   . Esophageal dysmotility   . Family history of adverse reaction to anesthesia    mother slow to wake up   . GERD (gastroesophageal reflux disease)    under control  . Glaucoma    "suspect"  . Gout    one finger x1 attack  . H/O hiatal hernia   . 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 wears hearing aides  . Heart murmur    no problem   . History of chicken pox   . History of melanoma    melanoma- 1986 and 1997   . Hypothyroidism   . Low back pain with sciatica   . Measles    hx of  . Mitral valve prolapse   . Nocturia   . Osteoporosis   . Peripheral neuropathy    feet  . PONV (postoperative nausea and vomiting)   . PTSD (post-traumatic stress disorder) 2006  . RSD (reflex sympathetic dystrophy) 1993  . Spider veins   . Urinary incontinence    occasional  . Varicose veins     Past Surgical History:  Procedure Laterality Date  .  ESOPHAGEAL MANOMETRY N/A 12/17/2015   Procedure: ESOPHAGEAL MANOMETRY (EM);  Surgeon: Mauri Pole, MD;  [facility];  Service: Endoscopy;  Laterality: N/A;  . HARDWARE REMOVAL Left 10/17/2013   Procedure: HARDWARE REMOVAL LEFT HIP;  Surgeon: Gearlean Alf, MD;  [facility];  Service: Orthopedics;  Laterality: Left;  . HIP FRACTURE SURGERY Left 2006   3 screws  . INSERTION OF MESH  02/08/2014   Procedure: INSERTION OF MESH;  Surgeon: Jackolyn Confer, MD;  [facility];  Service: General;;  . melanoma surgery   1986, 1997  . PARATHYROIDECTOMY  04/02/15  . [place] IMPEDANCE STUDY N/A 12/17/2015   Procedure: [place] IMPEDANCE STUDY;  Surgeon: Mauri Pole, MD;  [facility];  Service: Endoscopy;  Laterality: N/A;  . TOTAL HIP ARTHROPLASTY Left 04/10/2014   Procedure: LEFT TOTAL HIP ARTHROPLASTY ANTERIOR APPROACH;  Surgeon: Gearlean Alf, MD;  [facility];  Service: Orthopedics;  Laterality: Left;  . UMBILICAL HERNIA REPAIR N/A 02/08/2014   Procedure: HERNIA REPAIR UMBILICAL ADULT WITH MESH;  Surgeon: Jackolyn Confer, MD;  [facility];  Service: General;  Laterality: N/A;    There were no vitals filed for this visit.  Subjective Assessment - 02/20/2017 1152    Subjective  Reports that she has continued to be busy and doing a lot around the house, she reports that she has tolerated the activity pretty well    Currently in Pain?  No/denies                      [facility] Adult PT Treatment/Exercise - 2017-02-20 0001      Self-Care   Self-Care  Other Self-Care Comments    Other Self-Care Comments   Spent all of the session going over HEP, patient wanted it videoed so I used her phone to video her doing the exercises with my narration and critique of form and correcting her.  We did a full body routine with legs, arms and core, used handweights, ankle weights and ball               PT Short Term Goals - 01/02/17 1149      PT SHORT  TERM GOAL #1   Title  independent with HEP    Status  Achieved        PT Long Term Goals - Feb 20, 2017 1156      PT LONG TERM GOAL #1   Title  go up and down stairs step over step    Status  Achieved      PT LONG TERM GOAL #2   Baseline  20% and verb feeling stronger    Status  Achieved      PT LONG TERM GOAL #3   Title  walk 100 feet without difficulty    Status  Achieved      PT LONG TERM GOAL #4   Title  increase ROM of the cervical and lumbar spine 25%    Status  Achieved            Plan - February 20, 2017 1155    Clinical Impression Statement  Use of video and narration to have her an advanced HEP, she felt good about the video and the exercises prior to leaving    PT Next Visit Plan  discharge with goals met    Consulted and Agree with Plan of Care  Patient       Patient will benefit from skilled therapeutic intervention in order to improve the following deficits and impairments:  Cardiopulmonary status limiting activity, Decreased activity tolerance, Decreased balance, Decreased mobility, Decreased strength, Improper body mechanics, Impaired flexibility, Pain, Increased muscle spasms, Difficulty walking, Decreased range of motion  Visit Diagnosis: Acute bilateral low back pain without sciatica  Muscle weakness (generalized)  Cervicalgia   G-Codes - 02-20-17 1156    Functional Assessment Tool Used (Outpatient Only)  FOTO 41% limitation    Functional Limitation  Mobility: Walking and moving around    Mobility: Walking and Moving Around Current Status [phone])  At least 40 percent but less than 60 percent impaired, limited or restricted    Mobility: Walking and Moving Around Goal Status [phone])  At least 20 percent but less than 40 percent impaired, limited or restricted    Mobility: Walking and Moving Around Discharge Status [phone])  At least 40 percent but less than 60 percent impaired, limited or restricted       Problem List Patient Active Problem List    Diagnosis Date Noted  . Postherpetic neuralgia 12/25/2016  . Dysesthesia 12/25/2016  .  Polyneuropathy 12/25/2016  . Exposure to influenza 04/01/2016  . Dysphagia   . Bronchiectasis without acute exacerbation ([place]) 08/30/2015  . Bronchiectasis ([place]) 08/14/2015  . Chronic rhinitis 08/14/2015  . Avascular necrosis of femur head, left ([place]) 04/10/2014  . OA (osteoarthritis) of hip 04/10/2014  . Painful orthopaedic hardware [facility]) 10/17/2013    Sumner Boast., PT 02/16/2017, 11:57 AM  [place], [place], [address] Phone: [phone]   Fax:  [phone]  Name: Brooke Jimenez MRN: [id number] Date of Birth: 07/25/1945

## 2017-02-26 ENCOUNTER — Telehealth: Payer: Self-pay | Admitting: Pulmonary Disease

## 2017-02-26 DIAGNOSIS — J479 Bronchiectasis, uncomplicated: Secondary | ICD-10-CM

## 2017-02-26 NOTE — Telephone Encounter (Signed)
BQ, ok to send pulm rehab order for the [facility] program? Please advise.

## 2017-02-27 NOTE — Telephone Encounter (Signed)
OK by me 

## 2017-03-02 NOTE — Telephone Encounter (Signed)
Order placed for undergrad pulm rehab.  Nothing further needed.

## 2017-03-11 ENCOUNTER — Telehealth (HOSPITAL_COMMUNITY): Payer: Self-pay

## 2017-03-11 NOTE — Telephone Encounter (Signed)
Patient called to scheduled for Pulmonary Rehab - Scheduled orientation on 03/27/2017 at 9:30am. Patient will attend the 1:30pm exc class.

## 2017-03-27 ENCOUNTER — Ambulatory Visit (HOSPITAL_COMMUNITY): Payer: Medicare Other

## 2017-04-02 ENCOUNTER — Encounter (HOSPITAL_COMMUNITY)
Admission: RE | Admit: 2017-04-02 | Discharge: 2017-04-02 | Disposition: A | Discharge status: Home / Self Care | Payer: Medicare Other | Source: Ambulatory Visit | Attending: Pulmonary Disease | Admitting: Pulmonary Disease

## 2017-04-02 ENCOUNTER — Encounter (HOSPITAL_COMMUNITY): Payer: Self-pay

## 2017-04-02 VITALS — BP 149/74 | HR 72 | Resp 18 | Ht 60.75 in | Wt 169.1 lb

## 2017-04-02 DIAGNOSIS — J479 Bronchiectasis, uncomplicated: Secondary | ICD-10-CM | POA: Diagnosis not present

## 2017-04-02 NOTE — Progress Notes (Signed)
Brooke Jimenez 72 y.o. female Pulmonary Rehab Orientation Note Patient arrived today in Cardiac and Pulmonary Rehab for orientation to Pulmonary Rehab. She ambulated from [employer] without difficulty. She does not carry portable oxygen and has not been prescribed oxygen for home use. Color good, skin warm and dry. Patient is oriented to time and place. Patient's medical history, psychosocial health, and medications reviewed. Psychosocial assessment reveals pt lives with their spouse. Pt is currently retired. Pt hobbies include writing a book about the [place]. Pt reports her stress level is low. Areas of stress/anxiety include Health.  Pt does not exhibit signs of depression. PHQ2/9 score 0/na. Pt shows good  coping skills with positive outlook. She is offered emotional support and reassurance. Will continue to monitor and evaluate progress toward psychosocial goal(s) of remaining positive about her ability to remain active. Physical assessment reveals heart rate is normal, breath sounds clear to auscultation, no wheezes, rales, or rhonchi. Grip strength equal, strong. Distal pulses palpable. Patient reports she does take medications as prescribed. Patient states she follows a Regular diet. The patient reports no specific efforts to gain or lose weight. Patient's weight will be monitored closely. Demonstration and practice of PLB using pulse oximeter. Patient able to return demonstration satisfactorily. Safety and hand hygiene in the exercise area reviewed with patient. Patient voices understanding of the information reviewed. Department expectations discussed with patient and achievable goals were set. The patient shows enthusiasm about attending the program and we look forward to working with this nice lady. The patient is scheduled for a 6 min walk test on 04/03/17 and to begin exercise on 04/07/17 at 1:30.   45 minutes was spent on a variety of activities such as assessment of the patient,  obtaining baseline data including height, weight, BMI, and grip strength, verifying medical history, allergies, and current medications, and teaching patient strategies for performing tasks with less respiratory effort with emphasis on pursed lip breathing.

## 2017-04-03 ENCOUNTER — Encounter (HOSPITAL_COMMUNITY)
Admission: RE | Admit: 2017-04-03 | Discharge: 2017-04-03 | Disposition: A | Discharge status: Home / Self Care | Payer: Medicare Other | Source: Ambulatory Visit | Attending: Pulmonary Disease | Admitting: Pulmonary Disease

## 2017-04-03 DIAGNOSIS — J479 Bronchiectasis, uncomplicated: Secondary | ICD-10-CM | POA: Diagnosis not present

## 2017-04-03 NOTE — Progress Notes (Signed)
Brooke Jimenez 72 y.o. female  DOB: 25-May-1945 MRN: [id number]           Nutrition Brief Note 1. Bronchiectasis without complication [facility])    Past Medical History:  Diagnosis Date  . Acne rosacea   . Anxiety   . Arthritis    arthritis in hip   . Bronchiectasis ([place])   . Cataract    beginning  . Complication of anesthesia    "really sore throat"  . Corneal dystrophy   . DDD (degenerative disc disease), cervical    and lower back  . Diverticulosis   . Dysphagia    chronic- please see ultrasound done 4/16 15 in [facility]   . Dysrhythmia    sometimes patient has extra beats per patient   . Esophageal dysmotility   . Family history of adverse reaction to anesthesia    mother slow to wake up   . GERD (gastroesophageal reflux disease)    under control  . Glaucoma    "suspect"  . Gout    one finger x1 attack  . H/O hiatal hernia   . 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 wears hearing aides  . Heart murmur    no problem   . History of chicken pox   . History of melanoma    melanoma- 1986 and 1997   . Hypothyroidism   . Low back pain with sciatica   . Measles    hx of  . Mitral valve prolapse   . Nocturia   . Osteoporosis   . Peripheral neuropathy    feet  . PONV (postoperative nausea and vomiting)   . PTSD (post-traumatic stress disorder) 2006  . RSD (reflex sympathetic dystrophy) 1993  . Spider veins   . Urinary incontinence    occasional  . Varicose veins    Meds reviewed.   Ht: Ht Readings from Last 1 Encounters:  04/02/17 5' 0.75" (1.543 m)     Wt:  Wt Readings from Last 3 Encounters:  04/02/17 169 lb 1.5 oz (76.7 kg)  12/25/16 171 lb 8 oz (77.8 kg)  06/02/16 172 lb 6.4 oz (78.2 kg)     BMI: 29.9    Current tobacco use? No  Labs:  Lipid Panel  No results found for: CHOL, TRIG, HDL, CHOLHDL, VLDL, LDLCALC, LDLDIRECT  No results found for: HGBA1C  Nutrition Diagnosis ? Food-and nutrition-related knowledge deficit related to lack of  exposure to information as related to diagnosis of pulmonary disease ? Overweight related to excessive energy intake as evidenced by a BMI of 29.9  Goal(s) 1. Identify food quantities necessary to achieve wt loss of  -2# per week to a goal wt loss of 6-10 lb at graduation from pulmonary rehab.  Plan:  Pt to attend Pulmonary Nutrition class Will provide client-centered nutrition education as part of interdisciplinary care.   Monitor and evaluate progress toward nutrition goal with team.  Monitor and Evaluate progress toward nutrition goal with team.   Derek Mound, M.Ed, RD, LDN, CDE 04/03/2017 12:13 PM

## 2017-04-06 NOTE — Progress Notes (Signed)
Pulmonary Individual Treatment Plan  Patient Details  Name: Brooke Jimenez MRN: [id number] Date of Birth: 02/02/1946 Referring Provider:     Pulmonary Rehab Walk Test from 04/03/2017 in [place]  Referring Provider  Dr. Lake Bells      Initial Encounter Date:    Pulmonary Rehab Walk Test from 04/03/2017 in [place]  Date  04/03/17  Referring Provider  Dr. Lake Bells      Visit Diagnosis: No diagnosis found.  Patient's Home Medications on Admission:   Current Outpatient Medications:  .  calcium carbonate (TUMS - DOSED IN MG ELEMENTAL CALCIUM) 500 MG chewable tablet, Chew 3 tablets by mouth daily as needed for indigestion or heartburn. , Disp: , Rfl:  .  clindamycin (CLEOCIN) 150 MG capsule, Take 600 mg by mouth. One hour before dental appointments, Disp: , Rfl:  .  gabapentin (NEURONTIN) 100 MG capsule, Take 3 capsules (300 mg total) by mouth 2 (two) times daily. (Patient taking differently: Take 300 mg by mouth at bedtime. ), Disp: 30 capsule, Rfl: 0 .  levothyroxine (SYNTHROID, LEVOTHROID) 75 MCG tablet, Take 75 mcg by mouth daily before breakfast., Disp: , Rfl:  .  lidocaine (XYLOCAINE) 5 % ointment, Apply one inch of ointment to the painful area in the flank twice a day (Patient not taking: Reported on 04/02/2017), Disp: 35.44 g, Rfl: 5 .  Multiple Vitamin (MULTIVITAMIN WITH MINERALS) TABS tablet, Take 1 tablet by mouth daily., Disp: , Rfl:   Past Medical History: Past Medical History:  Diagnosis Date  . Acne rosacea   . Anxiety   . Arthritis    arthritis in hip   . Bronchiectasis ([place])   . Cataract    beginning  . Complication of anesthesia    "really sore throat"  . Corneal dystrophy   . DDD (degenerative disc disease), cervical    and lower back  . Diverticulosis   . Dysphagia    chronic- please see ultrasound done 4/16 15 in [facility]   . Dysrhythmia    sometimes patient has extra beats per patient   .  Esophageal dysmotility   . Family history of adverse reaction to anesthesia    mother slow to wake up   . GERD (gastroesophageal reflux disease)    under control  . Glaucoma    "suspect"  . Gout    one finger x1 attack  . H/O hiatal hernia   . 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 wears hearing aides  . Heart murmur    no problem   . History of chicken pox   . History of melanoma    melanoma- 1986 and 1997   . Hypothyroidism   . Low back pain with sciatica   . Measles    hx of  . Mitral valve prolapse   . Nocturia   . Osteoporosis   . Peripheral neuropathy    feet  . PONV (postoperative nausea and vomiting)   . PTSD (post-traumatic stress disorder) 2006  . RSD (reflex sympathetic dystrophy) 1993  . Spider veins   . Urinary incontinence    occasional  . Varicose veins     Tobacco Use: Social History   Tobacco Use  Smoking Status Never Smoker  Smokeless Tobacco Never Used    Labs: Recent Review Flowsheet Data    There is no flowsheet data to display.      Capillary Blood Glucose: No results found for: GLUCAP  Pulmonary Assessment Scores:   Pulmonary Function Assessment: Pulmonary Function Assessment - 04/02/17 0920      Breath   Bilateral Breath Sounds  Clear    Shortness of Breath  Yes;Limiting activity       Exercise Target Goals: Date: 04/03/17  Exercise Program Goal: Individual exercise prescription set using results from initial 6 min walk test and THRR while considering  patient's activity barriers and safety.    Exercise Prescription Goal: Initial exercise prescription builds to 30-45 minutes a day of aerobic activity, 2-3 days per week.  Home exercise guidelines will be given to patient during program as part of exercise prescription that the participant will acknowledge.  Activity Barriers & Risk Stratification: Activity Barriers & Cardiac Risk Stratification - 04/02/17 0908      Activity Barriers & Cardiac Risk  Stratification   Activity Barriers  Shortness of Breath;Left Hip Replacement       6 Minute Walk: 6 Minute Walk    Row Name 04/06/17 0800         6 Minute Walk   Phase  Initial     Distance  1700 feet     Walk Time  6 minutes     # of Rest Breaks  0     MPH  3.21     METS  3.45     RPE  13     Perceived Dyspnea   3     Symptoms  No     Resting HR  88 bpm     Resting BP  110/80     Resting Oxygen Saturation   98 %     Exercise Oxygen Saturation  during 6 min walk  91 %     Max Ex. HR  180 bpm     Max Ex. BP  174/90     2 Minute Post BP  138/82       Interval HR   1 Minute HR  110     2 Minute HR  118     3 Minute HR  130     4 Minute HR  128     5 Minute HR  160     6 Minute HR  180     2 Minute Post HR  103     Interval Heart Rate?  Yes       Interval Oxygen   Interval Oxygen?  Yes     Baseline Oxygen Saturation %  98 %     1 Minute Oxygen Saturation %  98 %     1 Minute Liters of Oxygen  0 L     2 Minute Oxygen Saturation %  98 %     2 Minute Liters of Oxygen  0 L     3 Minute Oxygen Saturation %  96 %     3 Minute Liters of Oxygen  0 L     4 Minute Oxygen Saturation %  95 %     4 Minute Liters of Oxygen  0 L     5 Minute Oxygen Saturation %  91 %     5 Minute Liters of Oxygen  0 L     6 Minute Oxygen Saturation %  97 %     6 Minute Liters of Oxygen  0 L     2 Minute Post Oxygen Saturation %  100 %     2 Minute Post Liters of Oxygen  0 L  Oxygen Initial Assessment: Oxygen Initial Assessment - 04/06/17 0802      Home Oxygen   Home Oxygen Device  None    Sleep Oxygen Prescription  None    Home Exercise Oxygen Prescription  None    Home at Rest Exercise Oxygen Prescription  None      Initial 6 min Walk   Oxygen Used  None      Program Oxygen Prescription   Program Oxygen Prescription  None       Oxygen Re-Evaluation: Oxygen Re-Evaluation    Row Name 04/03/17 1137             Program Oxygen Prescription   Program Oxygen  Prescription  None         Home Oxygen   Home Oxygen Device  None       Sleep Oxygen Prescription  None       Home Exercise Oxygen Prescription  None       Home at Rest Exercise Oxygen Prescription  None          Oxygen Discharge (Final Oxygen Re-Evaluation): Oxygen Re-Evaluation - 04/03/17 1137      Program Oxygen Prescription   Program Oxygen Prescription  None      Home Oxygen   Home Oxygen Device  None    Sleep Oxygen Prescription  None    Home Exercise Oxygen Prescription  None    Home at Rest Exercise Oxygen Prescription  None       Initial Exercise Prescription: Initial Exercise Prescription - 04/06/17 0800      Date of Initial Exercise RX and Referring Provider   Date  04/03/17    Referring Provider  Dr. Lake Bells      Treadmill   MPH  1.7    Grade  0    Minutes  17      NuStep   Level  2    SPM  80    Minutes  17    METs  1.5      Arm Ergometer   Level  2    Watts  10    Minutes  17      Prescription Details   Frequency (times per week)  2    Duration  Progress to 45 minutes of aerobic exercise without signs/symptoms of physical distress      Intensity   THRR 40-80% of Max Heartrate  59-118    Ratings of Perceived Exertion  11-15    Perceived Dyspnea  0-4      Progression   Progression  Continue to progress workloads to maintain intensity without signs/symptoms of physical distress.      Resistance Training   Training Prescription  Yes    Weight  blue bands    Reps  10-15       Perform Capillary Blood Glucose checks as needed.  Exercise Prescription Changes:   Exercise Comments:   Exercise Goals and Review: Exercise Goals    Row Name 04/02/17 0909             Exercise Goals   Increase Physical Activity  Yes       Intervention  Provide advice, education, support and counseling about physical activity/exercise needs.;Develop an individualized exercise prescription for aerobic and resistive training based on initial evaluation  findings, risk stratification, comorbidities and participant's personal goals.       Expected Outcomes  Short Term: Attend rehab on a regular basis to increase amount of physical activity.  Increase Strength and Stamina  Yes       Intervention  Provide advice, education, support and counseling about physical activity/exercise needs.;Develop an individualized exercise prescription for aerobic and resistive training based on initial evaluation findings, risk stratification, comorbidities and participant's personal goals.       Expected Outcomes  Short Term: Increase workloads from initial exercise prescription for resistance, speed, and METs.       Able to understand and use rate of perceived exertion (RPE) scale  Yes       Intervention  Provide education and explanation on how to use RPE scale       Expected Outcomes  Short Term: Able to use RPE daily in rehab to express subjective intensity level;Long Term:  Able to use RPE to guide intensity level when exercising independently       Able to understand and use Dyspnea scale  Yes       Intervention  Provide education and explanation on how to use Dyspnea scale       Expected Outcomes  Short Term: Able to use Dyspnea scale daily in rehab to express subjective sense of shortness of breath during exertion;Long Term: Able to use Dyspnea scale to guide intensity level when exercising independently       Knowledge and understanding of Target Heart Rate Range (THRR)  Yes       Intervention  Provide education and explanation of THRR including how the numbers were predicted and where they are located for reference       Expected Outcomes  Short Term: Able to state/look up THRR;Short Term: Able to use daily as guideline for intensity in rehab;Long Term: Able to use THRR to govern intensity when exercising independently       Understanding of Exercise Prescription  Yes       Intervention  Provide education, explanation, and written materials on patient's  individual exercise prescription       Expected Outcomes  Short Term: Able to explain program exercise prescription;Long Term: Able to explain home exercise prescription to exercise independently          Exercise Goals Re-Evaluation : Exercise Goals Re-Evaluation    [place] Name 04/03/17 1136             Exercise Goal Re-Evaluation   Exercise Goals Review  Increase Strength and Stamina;Able to understand and use Dyspnea scale;Knowledge and understanding of Target Heart Rate Range (THRR);Able to understand and use rate of perceived exertion (RPE) scale;Increase Physical Activity;Understanding of Exercise Prescription       Comments  Patient's first day of rehab will be 04/07/17.        Expected Outcomes  Through exercise at rehab and at home, patient will increase strength and stamina making ADL's easier to perform. Patient will also have a better understanding of safe exercise and what they are capable to do outside of clinical supervision.          Discharge Exercise Prescription (Final Exercise Prescription Changes):   Nutrition:  Target Goals: Understanding of nutrition guidelines, daily intake of sodium 1500mg , cholesterol 200mg , calories 30% from fat and 7% or less from saturated fats, daily to have 5 or more servings of fruits and vegetables.  Biometrics:    Nutrition Therapy Plan and Nutrition Goals:   Nutrition Assessments: Nutrition Assessments - 04/03/17 1212      Rate Your Plate Scores   Pre Score  55       Nutrition Goals Re-Evaluation:   Nutrition  Goals Discharge (Final Nutrition Goals Re-Evaluation):   Psychosocial: Target Goals: Acknowledge presence or absence of significant depression and/or stress, maximize coping skills, provide positive support system. Participant is able to verbalize types and ability to use techniques and skills needed for reducing stress and depression.  Initial Review & Psychosocial Screening: Initial Psych Review & Screening -  04/02/17 0923      Initial Review   Current issues with  None Identified      Family Dynamics   Good Support System?  Yes      Barriers   Psychosocial barriers to participate in program  There are no identifiable barriers or psychosocial needs.      Screening Interventions   Interventions  Encouraged to exercise       Quality of Life Scores:  Scores of 19 and below usually indicate a poorer quality of life in these areas.  A difference of  2-3 points is a clinically meaningful difference.  A difference of 2-3 points in the total score of the Quality of Life Index has been associated with significant improvement in overall quality of life, self-image, physical symptoms, and general health in studies assessing change in quality of life.   PHQ-9: Recent Review Flowsheet Data    Depression screen [facility] 2/9 04/02/2017 04/27/2015   Decreased Interest 0 0   Down, Depressed, Hopeless 0 0   PHQ - 2 Score 0 0     Interpretation of Total Score  Total Score Depression Severity:  1-4 = Minimal depression, 5-9 = Mild depression, 10-14 = Moderate depression, 15-19 = Moderately severe depression, 20-27 = Severe depression   Psychosocial Evaluation and Intervention: Psychosocial Evaluation - 04/02/17 0926      Psychosocial Evaluation & Interventions   Expected Outcomes  patient will remain free from psychosocial barriers to participation    Continue Psychosocial Services   No Follow up required       Psychosocial Re-Evaluation:   Psychosocial Discharge (Final Psychosocial Re-Evaluation):   Education: Education Goals: Education classes will be provided on a weekly basis, covering required topics. Participant will state understanding/return demonstration of topics presented.  Learning Barriers/Preferences: Learning Barriers/Preferences - 04/02/17 9242      Learning Barriers/Preferences   Learning Barriers  None    Learning Preferences  Written Material;Verbal Instruction;Group  Instruction;Individual Instruction;Computer/Internet       Education Topics: Risk Factor Reduction:  -Group instruction that is supported by a PowerPoint presentation. Instructor discusses the definition of a risk factor, different risk factors for pulmonary disease, and how the heart and lungs work together.     PULMONARY REHAB OTHER RESPIRATORY from 07/26/2015 in [place]  Date  07/26/15  Educator  EP  Instruction Review Code (Retired)  2- meets goals/outcomes      Nutrition for Pulmonary Patient:  -Group instruction provided by [employer], verbal discussion, and written materials to support subject matter. The instructor gives an explanation and review of healthy diet recommendations, which includes a discussion on weight management, recommendations for fruit and vegetable consumption, as well as protein, fluid, caffeine, fiber, sodium, sugar, and alcohol. Tips for eating when patients are short of breath are discussed.   PULMONARY REHAB OTHER RESPIRATORY from 07/26/2015 in [place]  Date  07/26/15  Educator  RD      Pursed Lip Breathing:  -Group instruction that is supported by demonstration and informational handouts. Instructor discusses the benefits of pursed lip and diaphragmatic breathing and detailed demonstration  on how to preform both.     Oxygen Safety:  -Group instruction provided by PowerPoint, verbal discussion, and written material to support subject matter. There is an overview of "What is Oxygen" and "Why do we need it".  Instructor also reviews how to create a safe environment for oxygen use, the importance of using oxygen as prescribed, and the risks of noncompliance. There is a brief discussion on traveling with oxygen and resources the patient may utilize.   PULMONARY REHAB OTHER RESPIRATORY from 07/26/2015 in [place]  Date  05/31/15  Educator  RN  Instruction  Review Code (Retired)  2- meets goals/outcomes      Oxygen Equipment:  -Group instruction provided by [employer], Network engineer, and Insurance underwriter.   PULMONARY REHAB OTHER RESPIRATORY from 07/26/2015 in [place]  Date  06/14/15  Educator  [employer] rep  Instruction Review Code (Retired)  2- meets goals/outcomes      Signs and Symptoms:  -Group instruction provided by written material and verbal discussion to support subject matter. Warning signs and symptoms of infection, stroke, and heart attack are reviewed and when to call the physician/911 reinforced. Tips for preventing the spread of infection discussed.   Advanced Directives:  -Group instruction provided by verbal instruction and written material to support subject matter. Instructor reviews Advanced Directive laws and proper instruction for filling out document.   PULMONARY REHAB OTHER RESPIRATORY from 07/26/2015 in [place]  Date  07/12/15  Educator  Jeanella Craze  Instruction Review Code (Retired)  2- meets goals/outcomes      Pulmonary Video:  -Group video education that reviews the importance of medication and oxygen compliance, exercise, good nutrition, pulmonary hygiene, and pursed lip and diaphragmatic breathing for the pulmonary patient.   Exercise for the Pulmonary Patient:  -Group instruction that is supported by a PowerPoint presentation. Instructor discusses benefits of exercise, core components of exercise, frequency, duration, and intensity of an exercise routine, importance of utilizing pulse oximetry during exercise, safety while exercising, and options of places to exercise outside of rehab.     Pulmonary Medications:  -Verbally interactive group education provided by instructor with focus on inhaled medications and proper administration.   PULMONARY REHAB OTHER RESPIRATORY from 07/26/2015 in [place]  Date  05/24/15  Educator  Pharm D  Instruction Review Code (Retired)  2- Lawyer and Physiology of the Respiratory System and Intimacy:  -Group instruction provided by PowerPoint, verbal discussion, and written material to support subject matter. Instructor reviews respiratory cycle and anatomical components of the respiratory system and their functions. Instructor also reviews differences in obstructive and restrictive respiratory diseases with examples of each. Intimacy, Sex, and Sexuality differences are reviewed with a discussion on how relationships can change when diagnosed with pulmonary disease. Common sexual concerns are reviewed.   PULMONARY REHAB OTHER RESPIRATORY from 07/26/2015 in [place]  Date  07/05/15  Educator  Trish Fountain  Instruction Review Code (Retired)  2- meets goals/outcomes      MD DAY -A group question and answer session with a medical doctor that allows participants to ask questions that relate to their pulmonary disease state.   OTHER EDUCATION -Group or individual verbal, written, or video instructions that support the educational goals of the pulmonary rehab program.   Holiday Eating Survival Tips:  -Group instruction  provided by PowerPoint slides, verbal discussion, and written materials to support subject matter. The instructor gives patients tips, tricks, and techniques to help them not only survive but enjoy the holidays despite the onslaught of food that accompanies the holidays.   Knowledge Questionnaire Score: Knowledge Questionnaire Score - 04/02/17 0917      Knowledge Questionnaire Score   Pre Score  17/18       Core Components/Risk Factors/Patient Goals at Admission:   Core Components/Risk Factors/Patient Goals Review:    Core Components/Risk Factors/Patient Goals at Discharge (Final Review):    ITP Comments:   Comments:

## 2017-04-07 ENCOUNTER — Encounter (HOSPITAL_COMMUNITY)
Admission: RE | Admit: 2017-04-07 | Discharge: 2017-04-07 | Disposition: A | Discharge status: Home / Self Care | Payer: Medicare Other | Source: Ambulatory Visit | Attending: Pulmonary Disease | Admitting: Pulmonary Disease

## 2017-04-07 DIAGNOSIS — J479 Bronchiectasis, uncomplicated: Secondary | ICD-10-CM | POA: Diagnosis not present

## 2017-04-07 DIAGNOSIS — J471 Bronchiectasis with (acute) exacerbation: Secondary | ICD-10-CM

## 2017-04-07 NOTE — Progress Notes (Signed)
Daily Session Note  Patient Details  Name: Brooke Jimenez MRN: [id number] Date of Birth: 08/28/1945 Referring Provider:     Pulmonary Rehab Walk Test from 04/03/2017 in [place]  Referring Provider  Dr. Lake Bells      Encounter Date: 04/07/2017  Check In: Session Check In - 04/07/17 1325      Check-In   Location  [facility]    Staff Present  Trish Fountain, RN, BSN;Molly diVincenzo, MS, ACSM RCEP, Exercise Physiologist;Lisa Ysidro Evert, RN;Garnie Borchardt Leonia Reeves, RN, BSN    Supervising physician immediately available to respond to emergencies  [facility] immediately available    Physician(s)  Dr. Lonny Prude    Medication changes reported      No    Fall or balance concerns reported     No    Tobacco Cessation  No Change    Warm-up and Cool-down  Performed as group-led instruction    Resistance Training Performed  Yes    VAD Patient?  No      Pain Assessment   Currently in Pain?  No/denies    Multiple Pain Sites  No       Capillary Blood Glucose: No results found for this or any previous visit (from the past 24 hour(s)).    Social History   Tobacco Use  Smoking Status Never Smoker  Smokeless Tobacco Never Used    Goals Met:  Exercise tolerated well Strength training completed today  Goals Unmet:  Not Applicable  Comments: Service time is from 1330 to 1510    Dr. Rush Farmer is Medical Director for Pulmonary Rehab at [facility].

## 2017-04-07 NOTE — Progress Notes (Signed)
Pulmonary Individual Treatment Plan  Patient Details  Name: Brooke Jimenez MRN: [id number] Date of Birth: [birth year] Referring Provider:     Pulmonary Rehab Walk Test from 04/03/2017 in [place]  Referring Provider  Dr. Lake Bells      Initial Encounter Date:    Pulmonary Rehab Walk Test from 04/03/2017 in [place]  Date  04/03/17  Referring Provider  Dr. Lake Bells      Visit Diagnosis: Bronchiectasis without complication ([place])  Bronchiectasis with acute exacerbation ([place])  Patient's Home Medications on Admission:   Current Outpatient Medications:  .  calcium carbonate (TUMS - DOSED IN MG ELEMENTAL CALCIUM) 500 MG chewable tablet, Chew 3 tablets by mouth daily as needed for indigestion or heartburn. , Disp: , Rfl:  .  clindamycin (CLEOCIN) 150 MG capsule, Take 600 mg by mouth. One hour before dental appointments, Disp: , Rfl:  .  gabapentin (NEURONTIN) 100 MG capsule, Take 3 capsules (300 mg total) by mouth 2 (two) times daily. (Patient taking differently: Take 300 mg by mouth at bedtime. ), Disp: 30 capsule, Rfl: 0 .  levothyroxine (SYNTHROID, LEVOTHROID) 75 MCG tablet, Take 75 mcg by mouth daily before breakfast., Disp: , Rfl:  .  lidocaine (XYLOCAINE) 5 % ointment, Apply one inch of ointment to the painful area in the flank twice a day (Patient not taking: Reported on 04/02/2017), Disp: 35.44 g, Rfl: 5 .  Multiple Vitamin (MULTIVITAMIN WITH MINERALS) TABS tablet, Take 1 tablet by mouth daily., Disp: , Rfl:   Past Medical History: Past Medical History:  Diagnosis Date  . Acne rosacea   . Anxiety   . Arthritis    arthritis in hip   . Bronchiectasis ([place])   . Cataract    beginning  . Complication of anesthesia    "really sore throat"  . Corneal dystrophy   . DDD (degenerative disc disease), cervical    and lower back  . Diverticulosis   . Dysphagia    chronic- please see ultrasound done 4/16 15 in [facility]   .  Dysrhythmia    sometimes patient has extra beats per patient   . Esophageal dysmotility   . Family history of adverse reaction to anesthesia    mother slow to wake up   . GERD (gastroesophageal reflux disease)    under control  . Glaucoma    "suspect"  . Gout    one finger x1 attack  . H/O hiatal hernia   . 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 wears hearing aides  . Heart murmur    no problem   . History of chicken pox   . History of melanoma    melanoma- 1986 and 1997   . Hypothyroidism   . Low back pain with sciatica   . Measles    hx of  . Mitral valve prolapse   . Nocturia   . Osteoporosis   . Peripheral neuropathy    feet  . PONV (postoperative nausea and vomiting)   . PTSD (post-traumatic stress disorder) 2006  . RSD (reflex sympathetic dystrophy) 1993  . Spider veins   . Urinary incontinence    occasional  . Varicose veins     Tobacco Use: Social History   Tobacco Use  Smoking Status Never Smoker  Smokeless Tobacco Never Used    Labs: Recent Review Flowsheet Data    There is no flowsheet data to display.      Capillary Blood  Glucose: No results found for: GLUCAP   Pulmonary Assessment Scores:   Pulmonary Function Assessment: Pulmonary Function Assessment - 04/02/17 0920      Breath   Bilateral Breath Sounds  Clear    Shortness of Breath  Yes;Limiting activity       Exercise Target Goals:    Exercise Program Goal: Individual exercise prescription set using results from initial 6 min walk test and THRR while considering  patient's activity barriers and safety.    Exercise Prescription Goal: Initial exercise prescription builds to 30-45 minutes a day of aerobic activity, 2-3 days per week.  Home exercise guidelines will be given to patient during program as part of exercise prescription that the participant will acknowledge.  Activity Barriers & Risk Stratification: Activity Barriers & Cardiac Risk Stratification - 04/02/17  0908      Activity Barriers & Cardiac Risk Stratification   Activity Barriers  Shortness of Breath;Left Hip Replacement       6 Minute Walk: 6 Minute Walk    Row Name 04/06/17 0800         6 Minute Walk   Phase  Initial     Distance  1700 feet     Walk Time  6 minutes     # of Rest Breaks  0     MPH  3.21     METS  3.45     RPE  13     Perceived Dyspnea   3     Symptoms  No     Resting HR  88 bpm     Resting BP  110/80     Resting Oxygen Saturation   98 %     Exercise Oxygen Saturation  during 6 min walk  91 %     Max Ex. HR  180 bpm     Max Ex. BP  174/90     2 Minute Post BP  138/82       Interval HR   1 Minute HR  110     2 Minute HR  118     3 Minute HR  130     4 Minute HR  128     5 Minute HR  160     6 Minute HR  180     2 Minute Post HR  103     Interval Heart Rate?  Yes       Interval Oxygen   Interval Oxygen?  Yes     Baseline Oxygen Saturation %  98 %     1 Minute Oxygen Saturation %  98 %     1 Minute Liters of Oxygen  0 L     2 Minute Oxygen Saturation %  98 %     2 Minute Liters of Oxygen  0 L     3 Minute Oxygen Saturation %  96 %     3 Minute Liters of Oxygen  0 L     4 Minute Oxygen Saturation %  95 %     4 Minute Liters of Oxygen  0 L     5 Minute Oxygen Saturation %  91 %     5 Minute Liters of Oxygen  0 L     6 Minute Oxygen Saturation %  97 %     6 Minute Liters of Oxygen  0 L     2 Minute Post Oxygen Saturation %  100 %     2 Minute Post  Liters of Oxygen  0 L        Oxygen Initial Assessment: Oxygen Initial Assessment - 04/06/17 0802      Home Oxygen   Home Oxygen Device  None    Sleep Oxygen Prescription  None    Home Exercise Oxygen Prescription  None    Home at Rest Exercise Oxygen Prescription  None      Initial 6 min Walk   Oxygen Used  None      Program Oxygen Prescription   Program Oxygen Prescription  None       Oxygen Re-Evaluation: Oxygen Re-Evaluation    Row Name 04/03/17 1137             Program  Oxygen Prescription   Program Oxygen Prescription  None         Home Oxygen   Home Oxygen Device  None       Sleep Oxygen Prescription  None       Home Exercise Oxygen Prescription  None       Home at Rest Exercise Oxygen Prescription  None          Oxygen Discharge (Final Oxygen Re-Evaluation): Oxygen Re-Evaluation - 04/03/17 1137      Program Oxygen Prescription   Program Oxygen Prescription  None      Home Oxygen   Home Oxygen Device  None    Sleep Oxygen Prescription  None    Home Exercise Oxygen Prescription  None    Home at Rest Exercise Oxygen Prescription  None       Initial Exercise Prescription: Initial Exercise Prescription - 04/06/17 0800      Date of Initial Exercise RX and Referring Provider   Date  04/03/17    Referring Provider  Dr. Lake Bells      Treadmill   MPH  1.7    Grade  0    Minutes  17      NuStep   Level  2    SPM  80    Minutes  17    METs  1.5      Arm Ergometer   Level  2    Watts  10    Minutes  17      Prescription Details   Frequency (times per week)  2    Duration  Progress to 45 minutes of aerobic exercise without signs/symptoms of physical distress      Intensity   THRR 40-80% of Max Heartrate  59-118    Ratings of Perceived Exertion  11-15    Perceived Dyspnea  0-4      Progression   Progression  Continue to progress workloads to maintain intensity without signs/symptoms of physical distress.      Resistance Training   Training Prescription  Yes    Weight  blue bands    Reps  10-15       Perform Capillary Blood Glucose checks as needed.  Exercise Prescription Changes:   Exercise Comments:   Exercise Goals and Review: Exercise Goals    Row Name 04/02/17 0909             Exercise Goals   Increase Physical Activity  Yes       Intervention  Provide advice, education, support and counseling about physical activity/exercise needs.;Develop an individualized exercise prescription for aerobic and resistive  training based on initial evaluation findings, risk stratification, comorbidities and participant's personal goals.       Expected Outcomes  Short Term:  Attend rehab on a regular basis to increase amount of physical activity.       Increase Strength and Stamina  Yes       Intervention  Provide advice, education, support and counseling about physical activity/exercise needs.;Develop an individualized exercise prescription for aerobic and resistive training based on initial evaluation findings, risk stratification, comorbidities and participant's personal goals.       Expected Outcomes  Short Term: Increase workloads from initial exercise prescription for resistance, speed, and METs.       Able to understand and use rate of perceived exertion (RPE) scale  Yes       Intervention  Provide education and explanation on how to use RPE scale       Expected Outcomes  Short Term: Able to use RPE daily in rehab to express subjective intensity level;Long Term:  Able to use RPE to guide intensity level when exercising independently       Able to understand and use Dyspnea scale  Yes       Intervention  Provide education and explanation on how to use Dyspnea scale       Expected Outcomes  Short Term: Able to use Dyspnea scale daily in rehab to express subjective sense of shortness of breath during exertion;Long Term: Able to use Dyspnea scale to guide intensity level when exercising independently       Knowledge and understanding of Target Heart Rate Range (THRR)  Yes       Intervention  Provide education and explanation of THRR including how the numbers were predicted and where they are located for reference       Expected Outcomes  Short Term: Able to state/look up THRR;Short Term: Able to use daily as guideline for intensity in rehab;Long Term: Able to use THRR to govern intensity when exercising independently       Understanding of Exercise Prescription  Yes       Intervention  Provide education, explanation,  and written materials on patient's individual exercise prescription       Expected Outcomes  Short Term: Able to explain program exercise prescription;Long Term: Able to explain home exercise prescription to exercise independently          Exercise Goals Re-Evaluation : Exercise Goals Re-Evaluation    [place] Name 04/03/17 1136             Exercise Goal Re-Evaluation   Exercise Goals Review  Increase Strength and Stamina;Able to understand and use Dyspnea scale;Knowledge and understanding of Target Heart Rate Range (THRR);Able to understand and use rate of perceived exertion (RPE) scale;Increase Physical Activity;Understanding of Exercise Prescription       Comments  Patient's first day of rehab will be 04/07/17.        Expected Outcomes  Through exercise at rehab and at home, patient will increase strength and stamina making ADL's easier to perform. Patient will also have a better understanding of safe exercise and what they are capable to do outside of clinical supervision.          Discharge Exercise Prescription (Final Exercise Prescription Changes):   Nutrition:  Target Goals: Understanding of nutrition guidelines, daily intake of sodium 1500mg , cholesterol 200mg , calories 30% from fat and 7% or less from saturated fats, daily to have 5 or more servings of fruits and vegetables.  Biometrics:    Nutrition Therapy Plan and Nutrition Goals:   Nutrition Assessments: Nutrition Assessments - 04/03/17 1212      Rate Your Plate Scores  Pre Score  55       Nutrition Goals Re-Evaluation:   Nutrition Goals Discharge (Final Nutrition Goals Re-Evaluation):   Psychosocial: Target Goals: Acknowledge presence or absence of significant depression and/or stress, maximize coping skills, provide positive support system. Participant is able to verbalize types and ability to use techniques and skills needed for reducing stress and depression.  Initial Review & Psychosocial  Screening: Initial Psych Review & Screening - 04/02/17 0923      Initial Review   Current issues with  None Identified      Family Dynamics   Good Support System?  Yes      Barriers   Psychosocial barriers to participate in program  There are no identifiable barriers or psychosocial needs.      Screening Interventions   Interventions  Encouraged to exercise       Quality of Life Scores:  Scores of 19 and below usually indicate a poorer quality of life in these areas.  A difference of  2-3 points is a clinically meaningful difference.  A difference of 2-3 points in the total score of the Quality of Life Index has been associated with significant improvement in overall quality of life, self-image, physical symptoms, and general health in studies assessing change in quality of life.   PHQ-9: Recent Review Flowsheet Data    Depression screen [facility] 2/9 04/02/2017 04/27/2015   Decreased Interest 0 0   Down, Depressed, Hopeless 0 0   PHQ - 2 Score 0 0     Interpretation of Total Score  Total Score Depression Severity:  1-4 = Minimal depression, 5-9 = Mild depression, 10-14 = Moderate depression, 15-19 = Moderately severe depression, 20-27 = Severe depression   Psychosocial Evaluation and Intervention: Psychosocial Evaluation - 04/02/17 0926      Psychosocial Evaluation & Interventions   Expected Outcomes  patient will remain free from psychosocial barriers to participation    Continue Psychosocial Services   No Follow up required       Psychosocial Re-Evaluation: Psychosocial Re-Evaluation    [place] Name 04/07/17 1603             Psychosocial Re-Evaluation   Current issues with  None Identified       Expected Outcomes  no barriers to participation in pulmonary rehab       Interventions  Encouraged to attend Pulmonary Rehabilitation for the exercise       Continue Psychosocial Services   No Follow up required          Psychosocial Discharge (Final Psychosocial  Re-Evaluation): Psychosocial Re-Evaluation - 04/07/17 1603      Psychosocial Re-Evaluation   Current issues with  None Identified    Expected Outcomes  no barriers to participation in pulmonary rehab    Interventions  Encouraged to attend Pulmonary Rehabilitation for the exercise    Continue Psychosocial Services   No Follow up required       Education: Education Goals: Education classes will be provided on a weekly basis, covering required topics. Participant will state understanding/return demonstration of topics presented.  Learning Barriers/Preferences: Learning Barriers/Preferences - 04/02/17 1478      Learning Barriers/Preferences   Learning Barriers  None    Learning Preferences  Written Material;Verbal Instruction;Group Instruction;Individual Instruction;Computer/Internet       Education Topics: Risk Factor Reduction:  -Group instruction that is supported by a PowerPoint presentation. Instructor discusses the definition of a risk factor, different risk factors for pulmonary disease, and how the heart and  lungs work together.     PULMONARY REHAB OTHER RESPIRATORY from 07/26/2015 in [place]  Date  07/26/15  Educator  EP  Instruction Review Code (Retired)  2- meets goals/outcomes      Nutrition for Pulmonary Patient:  -Group instruction provided by [employer], verbal discussion, and written materials to support subject matter. The instructor gives an explanation and review of healthy diet recommendations, which includes a discussion on weight management, recommendations for fruit and vegetable consumption, as well as protein, fluid, caffeine, fiber, sodium, sugar, and alcohol. Tips for eating when patients are short of breath are discussed.   PULMONARY REHAB OTHER RESPIRATORY from 07/26/2015 in [place]  Date  07/26/15  Educator  RD      Pursed Lip Breathing:  -Group instruction that is supported by  demonstration and informational handouts. Instructor discusses the benefits of pursed lip and diaphragmatic breathing and detailed demonstration on how to preform both.     Oxygen Safety:  -Group instruction provided by PowerPoint, verbal discussion, and written material to support subject matter. There is an overview of "What is Oxygen" and "Why do we need it".  Instructor also reviews how to create a safe environment for oxygen use, the importance of using oxygen as prescribed, and the risks of noncompliance. There is a brief discussion on traveling with oxygen and resources the patient may utilize.   PULMONARY REHAB OTHER RESPIRATORY from 07/26/2015 in [place]  Date  05/31/15  Educator  RN  Instruction Review Code (Retired)  2- meets goals/outcomes      Oxygen Equipment:  -Group instruction provided by [employer], Network engineer, and Insurance underwriter.   PULMONARY REHAB OTHER RESPIRATORY from 07/26/2015 in [place]  Date  06/14/15  Educator  [employer] rep  Instruction Review Code (Retired)  2- meets goals/outcomes      Signs and Symptoms:  -Group instruction provided by written material and verbal discussion to support subject matter. Warning signs and symptoms of infection, stroke, and heart attack are reviewed and when to call the physician/911 reinforced. Tips for preventing the spread of infection discussed.   Advanced Directives:  -Group instruction provided by verbal instruction and written material to support subject matter. Instructor reviews Advanced Directive laws and proper instruction for filling out document.   PULMONARY REHAB OTHER RESPIRATORY from 07/26/2015 in [place]  Date  07/12/15  Educator  Jeanella Craze  Instruction Review Code (Retired)  2- meets goals/outcomes      Pulmonary Video:  -Group video education that reviews the  importance of medication and oxygen compliance, exercise, good nutrition, pulmonary hygiene, and pursed lip and diaphragmatic breathing for the pulmonary patient.   Exercise for the Pulmonary Patient:  -Group instruction that is supported by a PowerPoint presentation. Instructor discusses benefits of exercise, core components of exercise, frequency, duration, and intensity of an exercise routine, importance of utilizing pulse oximetry during exercise, safety while exercising, and options of places to exercise outside of rehab.     Pulmonary Medications:  -Verbally interactive group education provided by instructor with focus on inhaled medications and proper administration.   PULMONARY REHAB OTHER RESPIRATORY from 07/26/2015 in [place]  Date  05/24/15  Educator  Pharm D  Instruction Review Code (Retired)  2- meets Designer, fashion/clothing and Physiology of the Respiratory System and  Intimacy:  -Group instruction provided by PowerPoint, verbal discussion, and written material to support subject matter. Instructor reviews respiratory cycle and anatomical components of the respiratory system and their functions. Instructor also reviews differences in obstructive and restrictive respiratory diseases with examples of each. Intimacy, Sex, and Sexuality differences are reviewed with a discussion on how relationships can change when diagnosed with pulmonary disease. Common sexual concerns are reviewed.   PULMONARY REHAB OTHER RESPIRATORY from 07/26/2015 in [place]  Date  07/05/15  Educator  Trish Fountain  Instruction Review Code (Retired)  2- meets goals/outcomes      MD DAY -A group question and answer session with a medical doctor that allows participants to ask questions that relate to their pulmonary disease state.   OTHER EDUCATION -Group or individual verbal, written, or video instructions that support the educational  goals of the pulmonary rehab program.   Holiday Eating Survival Tips:  -Group instruction provided by PowerPoint slides, verbal discussion, and written materials to support subject matter. The instructor gives patients tips, tricks, and techniques to help them not only survive but enjoy the holidays despite the onslaught of food that accompanies the holidays.   Knowledge Questionnaire Score: Knowledge Questionnaire Score - 04/02/17 0917      Knowledge Questionnaire Score   Pre Score  17/18       Core Components/Risk Factors/Patient Goals at Admission:   Core Components/Risk Factors/Patient Goals Review:  Goals and Risk Factor Review    [place] Name 04/07/17 1601             Core Components/Risk Factors/Patient Goals Review   Personal Goals Review  Weight Management/Obesity;Develop more efficient breathing techniques such as purse lipped breathing and diaphragmatic breathing and practicing self-pacing with activity.;Improve shortness of breath with ADL's       Review  Just started program, too early to see progress towards goals       Expected Outcomes  see admission goals          Core Components/Risk Factors/Patient Goals at Discharge (Final Review):  Goals and Risk Factor Review - 04/07/17 1601      Core Components/Risk Factors/Patient Goals Review   Personal Goals Review  Weight Management/Obesity;Develop more efficient breathing techniques such as purse lipped breathing and diaphragmatic breathing and practicing self-pacing with activity.;Improve shortness of breath with ADL's    Review  Just started program, too early to see progress towards goals    Expected Outcomes  see admission goals       ITP Comments:   Comments: ITP REVIEW Pt is making expected progress toward pulmonary rehab goals after completing 1 sessions. Recommend continued exercise, life style modification, education, and utilization of breathing techniques to increase stamina and strength and decrease  shortness of breath with exertion.

## 2017-04-09 ENCOUNTER — Encounter (HOSPITAL_COMMUNITY)
Admission: RE | Admit: 2017-04-09 | Discharge: 2017-04-09 | Disposition: A | Discharge status: Home / Self Care | Payer: Medicare Other | Source: Ambulatory Visit | Attending: Pulmonary Disease | Admitting: Pulmonary Disease

## 2017-04-09 VITALS — Wt 169.5 lb

## 2017-04-09 DIAGNOSIS — J479 Bronchiectasis, uncomplicated: Secondary | ICD-10-CM

## 2017-04-09 NOTE — Progress Notes (Signed)
Daily Session Note  Patient Details  Name: Brooke Jimenez MRN: [id number] Date of Birth: [birth year] Referring Provider:     Pulmonary Rehab Walk Test from 04/03/2017 in [place]  Referring Provider  Dr. Lake Bells      Encounter Date: 04/09/2017  Check In: Session Check In - 04/09/17 1330      Check-In   Location  [facility]    Staff Present  Trish Fountain, RN, BSN;Molly diVincenzo, MS, ACSM RCEP, Exercise Physiologist;Alee Katen Ysidro Evert, RN    Supervising physician immediately available to respond to emergencies  [facility] immediately available    Physician(s)  Dr. Tyrell Antonio    Medication changes reported      No    Fall or balance concerns reported     No    Tobacco Cessation  No Change    Warm-up and Cool-down  Performed as group-led instruction    Resistance Training Performed  Yes    VAD Patient?  No      Pain Assessment   Currently in Pain?  No/denies    Multiple Pain Sites  No       Capillary Blood Glucose: No results found for this or any previous visit (from the past 24 hour(s)).    Social History   Tobacco Use  Smoking Status Never Smoker  Smokeless Tobacco Never Used    Goals Met:  Exercise tolerated well No report of cardiac concerns or symptoms Strength training completed today  Goals Unmet:  Not Applicable  Comments: Service time is from 1330 to 1535    Dr. Rush Farmer is Medical Director for Pulmonary Rehab at [facility].

## 2017-04-14 ENCOUNTER — Encounter (HOSPITAL_COMMUNITY): Payer: Medicare Other

## 2017-04-16 ENCOUNTER — Encounter (HOSPITAL_COMMUNITY)
Admission: RE | Admit: 2017-04-16 | Discharge: 2017-04-16 | Disposition: A | Discharge status: Home / Self Care | Payer: Medicare Other | Source: Ambulatory Visit | Attending: Pulmonary Disease | Admitting: Pulmonary Disease

## 2017-04-16 DIAGNOSIS — J479 Bronchiectasis, uncomplicated: Secondary | ICD-10-CM

## 2017-04-16 DIAGNOSIS — J471 Bronchiectasis with (acute) exacerbation: Secondary | ICD-10-CM

## 2017-04-16 NOTE — Progress Notes (Signed)
Daily Session Note  Patient Details  Name: Brooke Jimenez MRN: [id number] Date of Birth: 22-Oct-1945 Referring Provider:     Pulmonary Rehab Walk Test from 04/03/2017 in [place]  Referring Provider  Dr. Lake Bells      Encounter Date: 04/16/2017  Check In: Session Check In - 04/16/17 1330      Check-In   Location  [facility]    Staff Present  Rosebud Poles, RN, BSN;Molly diVincenzo, MS, ACSM RCEP, Exercise Physiologist;Lisa Ysidro Evert, RN;Portia Rollene Rotunda, RN, BSN    Supervising physician immediately available to respond to emergencies  [facility] immediately available    Physician(s)  Dr. Horris Latino    Medication changes reported      No    Fall or balance concerns reported     No    Tobacco Cessation  No Change    Warm-up and Cool-down  Performed as group-led instruction    Resistance Training Performed  Yes    VAD Patient?  No      Pain Assessment   Currently in Pain?  No/denies    Multiple Pain Sites  No       Capillary Blood Glucose: No results found for this or any previous visit (from the past 24 hour(s)).    Social History   Tobacco Use  Smoking Status Never Smoker  Smokeless Tobacco Never Used    Goals Met:  Exercise tolerated well Strength training completed today  Goals Unmet:  Not Applicable  Comments: Service time is from 1330 to [place]    Dr. Rush Farmer is Medical Director for Pulmonary Rehab at [facility].

## 2017-04-21 ENCOUNTER — Encounter (HOSPITAL_COMMUNITY)
Admission: RE | Admit: 2017-04-21 | Discharge: 2017-04-21 | Disposition: A | Discharge status: Home / Self Care | Payer: Medicare Other | Source: Ambulatory Visit | Attending: Pulmonary Disease | Admitting: Pulmonary Disease

## 2017-04-21 DIAGNOSIS — J479 Bronchiectasis, uncomplicated: Secondary | ICD-10-CM | POA: Diagnosis not present

## 2017-04-21 NOTE — Progress Notes (Signed)
Daily Session Note  Patient Details  Name: Brooke Jimenez MRN: [id number] Date of Birth: [birth year] Referring Provider:     Pulmonary Rehab Walk Test from 04/03/2017 in [place]  Referring Provider  Dr. Lake Bells      Encounter Date: 04/21/2017  Check In: Session Check In - 04/21/17 1330      Check-In   Location  [facility]    Staff Present  Rosebud Poles, RN, BSN;Molly diVincenzo, MS, ACSM RCEP, Exercise Physiologist;Lisa Ysidro Evert, RN;Danyel Tobey Rollene Rotunda, RN, BSN    Supervising physician immediately available to respond to emergencies  [facility] immediately available    Physician(s)  Dr. Horris Latino    Medication changes reported      No    Fall or balance concerns reported     No    Tobacco Cessation  No Change    Warm-up and Cool-down  Performed as group-led instruction    Resistance Training Performed  Yes    VAD Patient?  No      Pain Assessment   Currently in Pain?  No/denies    Multiple Pain Sites  No       Capillary Blood Glucose: No results found for this or any previous visit (from the past 24 hour(s)).    Social History   Tobacco Use  Smoking Status Never Smoker  Smokeless Tobacco Never Used    Goals Met:  Using PLB without cueing & demonstrates good technique Exercise tolerated well No report of cardiac concerns or symptoms Strength training completed today  Goals Unmet:  Not Applicable  Comments: Service time is from 1330 to 1500   Dr. Rush Farmer is Medical Director for Pulmonary Rehab at [facility].

## 2017-04-21 NOTE — Progress Notes (Signed)
I have reviewed a Home Exercise Prescription with [name] . Brooke Jimenez is currently exercising at home.  The patient was advised to walk 2 days a week for 45 minutes.  Brooke Jimenez and I discussed how to progress their exercise prescription.  The patient stated that their goals were to decrease shortness of breath with activities.  The patient stated that they understand the exercise prescription.  We reviewed exercise guidelines, target heart rate during exercise, oxygen use, weather, home pulse oximeter, endpoints for exercise, and goals.  Patient is encouraged to come to me with any questions. I will continue to follow up with the patient to assist them with progression and safety.

## 2017-04-23 ENCOUNTER — Encounter (HOSPITAL_COMMUNITY)
Admission: RE | Admit: 2017-04-23 | Discharge: 2017-04-23 | Disposition: A | Discharge status: Home / Self Care | Payer: Medicare Other | Source: Ambulatory Visit | Attending: Pulmonary Disease | Admitting: Pulmonary Disease

## 2017-04-23 DIAGNOSIS — J479 Bronchiectasis, uncomplicated: Secondary | ICD-10-CM

## 2017-04-23 DIAGNOSIS — J471 Bronchiectasis with (acute) exacerbation: Secondary | ICD-10-CM

## 2017-04-23 NOTE — Progress Notes (Signed)
Daily Session Note  Patient Details  Name: Brooke Jimenez MRN: [id number] Date of Birth: 10/26/1945 Referring Provider:     Pulmonary Rehab Walk Test from 04/03/2017 in [facility]  Referring Provider  Dr. McQuaid      Encounter Date: 04/23/2017  Check In: Session Check In - 04/23/17 1352      Check-In   Location  [facility]    Staff Present  Joan Behrens, RN, BSN;Molly diVincenzo, MS, ACSM RCEP, Exercise Physiologist;Portia Payne, RN, BSN    Supervising physician immediately available to respond to emergencies  [facility] immediately available    Physician(s)  Dr. Vann    Medication changes reported      No    Fall or balance concerns reported     No    Tobacco Cessation  No Change    Warm-up and Cool-down  Performed as group-led instruction    Resistance Training Performed  Yes    VAD Patient?  No      Pain Assessment   Currently in Pain?  No/denies    Multiple Pain Sites  No       Capillary Blood Glucose: No results found for this or any previous visit (from the past 24 hour(s)).    Social History   Tobacco Use  Smoking Status Never Smoker  Smokeless Tobacco Never Used    Goals Met:  Exercise tolerated well No report of cardiac concerns or symptoms Strength training completed today  Goals Unmet:  Not Applicable  Comments: Service time is from 1330 to 1530   Dr. Wesam G. Yacoub is Medical Director for Pulmonary Rehab at [facility]. 

## 2017-04-28 ENCOUNTER — Encounter (HOSPITAL_COMMUNITY)
Admission: RE | Admit: 2017-04-28 | Discharge: 2017-04-28 | Disposition: A | Discharge status: Home / Self Care | Payer: Medicare Other | Source: Ambulatory Visit | Attending: Pulmonary Disease | Admitting: Pulmonary Disease

## 2017-04-28 VITALS — Wt 170.2 lb

## 2017-04-28 DIAGNOSIS — J471 Bronchiectasis with (acute) exacerbation: Secondary | ICD-10-CM

## 2017-04-28 DIAGNOSIS — J479 Bronchiectasis, uncomplicated: Secondary | ICD-10-CM | POA: Diagnosis not present

## 2017-04-28 NOTE — Progress Notes (Signed)
Daily Session Note  Patient Details  Name: Brooke Jimenez MRN: [id number] Date of Birth: 1945/03/17 Referring Provider:     Pulmonary Rehab Walk Test from 04/03/2017 in [place]  Referring Provider  Dr. Lake Bells      Encounter Date: 04/28/2017  Check In: Session Check In - 04/28/17 1508      Check-In   Location  [facility]    Staff Present  Rosebud Poles, RN, BSN;Molly diVincenzo, MS, ACSM RCEP, Exercise Physiologist;Fiorella Hanahan Rollene Rotunda, RN, Roque Cash, RN    Supervising physician immediately available to respond to emergencies  [facility] immediately available    Physician(s)  Dr. Eliseo Squires    Medication changes reported      No    Fall or balance concerns reported     No    Tobacco Cessation  No Change    Warm-up and Cool-down  Performed as group-led instruction    Resistance Training Performed  Yes    VAD Patient?  No      Pain Assessment   Currently in Pain?  No/denies    Multiple Pain Sites  No       Capillary Blood Glucose: No results found for this or any previous visit (from the past 24 hour(s)).  Exercise Prescription Changes - 04/28/17 1608      Response to Exercise   Blood Pressure (Admit)  124/70    Blood Pressure (Exercise)  133/90    Blood Pressure (Exit)  108/76    Heart Rate (Admit)  78 bpm    Heart Rate (Exercise)  102 bpm    Heart Rate (Exit)  76 bpm    Oxygen Saturation (Admit)  98 %    Oxygen Saturation (Exercise)  96 %    Oxygen Saturation (Exit)  96 %    Rating of Perceived Exertion (Exercise)  13    Perceived Dyspnea (Exercise)  2    Duration  Progress to 45 minutes of aerobic exercise without signs/symptoms of physical distress    Intensity  Other (comment) 40-80% HRR      Progression   Progression  Continue to progress workloads to maintain intensity without signs/symptoms of physical distress.      Resistance Training   Training Prescription  Yes    Weight  blue bands    Reps  10-15    Time  10 Minutes      NuStep   Level  3    SPM  80    Minutes  17      Arm Ergometer   Level  3    Minutes  17      Track   Laps  12    Minutes  17       Social History   Tobacco Use  Smoking Status Never Smoker  Smokeless Tobacco Never Used    Goals Met:  Independence with exercise equipment Improved SOB with ADL's Using PLB without cueing & demonstrates good technique Exercise tolerated well No report of cardiac concerns or symptoms Strength training completed today  Goals Unmet:  Not Applicable  Comments: Service time is from 1330 to 1500   Dr. Rush Farmer is Medical Director for Pulmonary Rehab at [facility].

## 2017-04-30 ENCOUNTER — Encounter (HOSPITAL_COMMUNITY)
Admission: RE | Admit: 2017-04-30 | Discharge: 2017-04-30 | Disposition: A | Discharge status: Home / Self Care | Payer: Medicare Other | Source: Ambulatory Visit | Attending: Pulmonary Disease | Admitting: Pulmonary Disease

## 2017-04-30 DIAGNOSIS — J479 Bronchiectasis, uncomplicated: Secondary | ICD-10-CM

## 2017-04-30 DIAGNOSIS — J471 Bronchiectasis with (acute) exacerbation: Secondary | ICD-10-CM

## 2017-04-30 NOTE — Progress Notes (Signed)
Daily Session Note  Patient Details  Name: Brooke Jimenez MRN: [id number] Date of Birth: 08-16-1945 Referring Provider:     Pulmonary Rehab Walk Test from 04/03/2017 in [place]  Referring Provider  Dr. Lake Bells      Encounter Date: 04/30/2017  Check In: Session Check In - 04/30/17 1346      Check-In   Location  [facility]    Staff Present  Rosebud Poles, RN, BSN;Molly diVincenzo, MS, ACSM RCEP, Exercise Physiologist;Portia Rollene Rotunda, RN, Roque Cash, RN    Supervising physician immediately available to respond to emergencies  [facility] immediately available    Physician(s)  Dr. Horris Latino    Medication changes reported      No    Fall or balance concerns reported     No    Tobacco Cessation  No Change    Warm-up and Cool-down  Performed as group-led instruction    Resistance Training Performed  Yes    VAD Patient?  No      Pain Assessment   Currently in Pain?  No/denies    Multiple Pain Sites  No       Capillary Blood Glucose: No results found for this or any previous visit (from the past 24 hour(s)).    Social History   Tobacco Use  Smoking Status Never Smoker  Smokeless Tobacco Never Used    Goals Met:  Exercise tolerated well No report of cardiac concerns or symptoms Strength training completed today  Goals Unmet:  Not Applicable  Comments: Service time is from 1330 to 1515    Dr. Rush Farmer is Medical Director for Pulmonary Rehab at [facility].

## 2017-05-04 NOTE — Progress Notes (Signed)
Pulmonary Individual Treatment Plan  Patient Details  Name: Brooke Jimenez MRN: [id number] Date of Birth: 1946-01-02 Referring Provider:     Pulmonary Rehab Walk Test from 04/03/2017 in [place]  Referring Provider  Dr. Lake Bells      Initial Encounter Date:    Pulmonary Rehab Walk Test from 04/03/2017 in [place]  Date  04/03/17  Referring Provider  Dr. Lake Bells      Visit Diagnosis: Bronchiectasis without complication ([place])  Patient's Home Medications on Admission:   Current Outpatient Medications:  .  calcium carbonate (TUMS - DOSED IN MG ELEMENTAL CALCIUM) 500 MG chewable tablet, Chew 3 tablets by mouth daily as needed for indigestion or heartburn. , Disp: , Rfl:  .  clindamycin (CLEOCIN) 150 MG capsule, Take 600 mg by mouth. One hour before dental appointments, Disp: , Rfl:  .  gabapentin (NEURONTIN) 100 MG capsule, Take 3 capsules (300 mg total) by mouth 2 (two) times daily. (Patient taking differently: Take 300 mg by mouth at bedtime. ), Disp: 30 capsule, Rfl: 0 .  levothyroxine (SYNTHROID, LEVOTHROID) 75 MCG tablet, Take 75 mcg by mouth daily before breakfast., Disp: , Rfl:  .  lidocaine (XYLOCAINE) 5 % ointment, Apply one inch of ointment to the painful area in the flank twice a day (Patient not taking: Reported on 04/02/2017), Disp: 35.44 g, Rfl: 5 .  Multiple Vitamin (MULTIVITAMIN WITH MINERALS) TABS tablet, Take 1 tablet by mouth daily., Disp: , Rfl:   Past Medical History: Past Medical History:  Diagnosis Date  . Acne rosacea   . Anxiety   . Arthritis    arthritis in hip   . Bronchiectasis (Grandfather)   . Cataract    beginning  . Complication of anesthesia    "really sore throat"  . Corneal dystrophy   . DDD (degenerative disc disease), cervical    and lower back  . Diverticulosis   . Dysphagia    chronic- please see ultrasound done 4/16 15 in [facility]   . Dysrhythmia    sometimes patient has extra beats  per patient   . Esophageal dysmotility   . Family history of adverse reaction to anesthesia    mother slow to wake up   . GERD (gastroesophageal reflux disease)    under control  . Glaucoma    "suspect"  . Gout    one finger x1 attack  . H/O hiatal hernia   . 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 wears hearing aides  . Heart murmur    no problem   . History of chicken pox   . History of melanoma    melanoma- 1986 and 1997   . Hypothyroidism   . Low back pain with sciatica   . Measles    hx of  . Mitral valve prolapse   . Nocturia   . Osteoporosis   . Peripheral neuropathy    feet  . PONV (postoperative nausea and vomiting)   . PTSD (post-traumatic stress disorder) 2006  . RSD (reflex sympathetic dystrophy) 1993  . Spider veins   . Urinary incontinence    occasional  . Varicose veins     Tobacco Use: Social History   Tobacco Use  Smoking Status Never Smoker  Smokeless Tobacco Never Used    Labs: Recent Review Flowsheet Data    There is no flowsheet data to display.      Capillary Blood Glucose: No results found for: GLUCAP  Pulmonary Assessment Scores: Pulmonary Assessment Scores    Row Name 04/09/17 1628         ADL UCSD   ADL Phase  Entry     SOB Score total  41       CAT Score   CAT Score  16 Entry        Pulmonary Function Assessment: Pulmonary Function Assessment - 04/02/17 0920      Breath   Bilateral Breath Sounds  Clear    Shortness of Breath  Yes;Limiting activity       Exercise Target Goals:    Exercise Program Goal: Individual exercise prescription set using results from initial 6 min walk test and THRR while considering  patient's activity barriers and safety.    Exercise Prescription Goal: Initial exercise prescription builds to 30-45 minutes a day of aerobic activity, 2-3 days per week.  Home exercise guidelines will be given to patient during program as part of exercise prescription that the participant will  acknowledge.  Activity Barriers & Risk Stratification: Activity Barriers & Cardiac Risk Stratification - 04/02/17 0908      Activity Barriers & Cardiac Risk Stratification   Activity Barriers  Shortness of Breath;Left Hip Replacement       6 Minute Walk: 6 Minute Walk    Row Name 04/06/17 0800         6 Minute Walk   Phase  Initial     Distance  1700 feet     Walk Time  6 minutes     # of Rest Breaks  0     MPH  3.21     METS  3.45     RPE  13     Perceived Dyspnea   3     Symptoms  No     Resting HR  88 bpm     Resting BP  110/80     Resting Oxygen Saturation   98 %     Exercise Oxygen Saturation  during 6 min walk  91 %     Max Ex. HR  180 bpm     Max Ex. BP  174/90     2 Minute Post BP  138/82       Interval HR   1 Minute HR  110     2 Minute HR  118     3 Minute HR  130     4 Minute HR  128     5 Minute HR  160     6 Minute HR  180     2 Minute Post HR  103     Interval Heart Rate?  Yes       Interval Oxygen   Interval Oxygen?  Yes     Baseline Oxygen Saturation %  98 %     1 Minute Oxygen Saturation %  98 %     1 Minute Liters of Oxygen  0 L     2 Minute Oxygen Saturation %  98 %     2 Minute Liters of Oxygen  0 L     3 Minute Oxygen Saturation %  96 %     3 Minute Liters of Oxygen  0 L     4 Minute Oxygen Saturation %  95 %     4 Minute Liters of Oxygen  0 L     5 Minute Oxygen Saturation %  91 %     5 Minute Liters of Oxygen  0 L     6 Minute Oxygen Saturation %  97 %     6 Minute Liters of Oxygen  0 L     2 Minute Post Oxygen Saturation %  100 %     2 Minute Post Liters of Oxygen  0 L        Oxygen Initial Assessment: Oxygen Initial Assessment - 04/06/17 0802      Home Oxygen   Home Oxygen Device  None    Sleep Oxygen Prescription  None    Home Exercise Oxygen Prescription  None    Home at Rest Exercise Oxygen Prescription  None      Initial 6 min Walk   Oxygen Used  None      Program Oxygen Prescription   Program Oxygen  Prescription  None       Oxygen Re-Evaluation: Oxygen Re-Evaluation    Row Name 04/03/17 1137 05/04/17 0851           Program Oxygen Prescription   Program Oxygen Prescription  None  None        Home Oxygen   Home Oxygen Device  None  None      Sleep Oxygen Prescription  None  None      Home Exercise Oxygen Prescription  None  None      Home at Rest Exercise Oxygen Prescription  None  None         Oxygen Discharge (Final Oxygen Re-Evaluation): Oxygen Re-Evaluation - 05/04/17 0851      Program Oxygen Prescription   Program Oxygen Prescription  None      Home Oxygen   Home Oxygen Device  None    Sleep Oxygen Prescription  None    Home Exercise Oxygen Prescription  None    Home at Rest Exercise Oxygen Prescription  None       Initial Exercise Prescription: Initial Exercise Prescription - 04/06/17 0800      Date of Initial Exercise RX and Referring Provider   Date  04/03/17    Referring Provider  Dr. Lake Bells      Treadmill   MPH  1.7    Grade  0    Minutes  17      NuStep   Level  2    SPM  80    Minutes  17    METs  1.5      Arm Ergometer   Level  2    Watts  10    Minutes  17      Prescription Details   Frequency (times per week)  2    Duration  Progress to 45 minutes of aerobic exercise without signs/symptoms of physical distress      Intensity   THRR 40-80% of Max Heartrate  59-118    Ratings of Perceived Exertion  11-15    Perceived Dyspnea  0-4      Progression   Progression  Continue to progress workloads to maintain intensity without signs/symptoms of physical distress.      Resistance Training   Training Prescription  Yes    Weight  blue bands    Reps  10-15       Perform Capillary Blood Glucose checks as needed.  Exercise Prescription Changes: Exercise Prescription Changes    Row Name 04/09/17 1608 04/21/17 1600 04/28/17 1608         Response to Exercise   Blood Pressure (Admit)  144/80  -  124/70  Blood Pressure  (Exercise)  144/84  -  133/90     Blood Pressure (Exit)  112/74  -  108/76     Heart Rate (Admit)  70 bpm  -  78 bpm     Heart Rate (Exercise)  90 bpm  -  102 bpm     Heart Rate (Exit)  73 bpm  -  76 bpm     Oxygen Saturation (Admit)  97 %  -  98 %     Oxygen Saturation (Exercise)  97 %  -  96 %     Oxygen Saturation (Exit)  96 %  -  96 %     Rating of Perceived Exertion (Exercise)  13  -  13     Perceived Dyspnea (Exercise)  1  -  2     Duration  Progress to 45 minutes of aerobic exercise without signs/symptoms of physical distress  -  Progress to 45 minutes of aerobic exercise without signs/symptoms of physical distress     Intensity  Other (comment) 40-80% HRR  -  Other (comment) 40-80% HRR       Progression   Progression  Continue to progress workloads to maintain intensity without signs/symptoms of physical distress.  -  Continue to progress workloads to maintain intensity without signs/symptoms of physical distress.       Resistance Training   Training Prescription  Yes  -  Yes     Weight  blue bands  -  blue bands     Reps  10-15  -  10-15     Time  10 Minutes  -  10 Minutes       NuStep   Level  3  -  3     SPM  80  -  80     Minutes  17  -  17     METs  2  -  -       Arm Ergometer   Level  -  -  3     Minutes  -  -  17       Track   Laps  11  -  12     Minutes  17  -  17       Home Exercise Plan   Plans to continue exercise at  [facility] (comment)  -     Frequency  -  Add 2 additional days to program exercise sessions.  -        Exercise Comments: Exercise Comments    Row Name 04/21/17 1630           Exercise Comments  Home exercise completed          Exercise Goals and Review: Exercise Goals    [place] Name 04/02/17 0909             Exercise Goals   Increase Physical Activity  Yes       Intervention  Provide advice, education, support and counseling about physical activity/exercise needs.;Develop an individualized exercise prescription  for aerobic and resistive training based on initial evaluation findings, risk stratification, comorbidities and participant's personal goals.       Expected Outcomes  Short Term: Attend rehab on a regular basis to increase amount of physical activity.       Increase Strength and Stamina  Yes       Intervention  Provide advice, education, support and counseling about physical activity/exercise needs.;Develop  an individualized exercise prescription for aerobic and resistive training based on initial evaluation findings, risk stratification, comorbidities and participant's personal goals.       Expected Outcomes  Short Term: Increase workloads from initial exercise prescription for resistance, speed, and METs.       Able to understand and use rate of perceived exertion (RPE) scale  Yes       Intervention  Provide education and explanation on how to use RPE scale       Expected Outcomes  Short Term: Able to use RPE daily in rehab to express subjective intensity level;Long Term:  Able to use RPE to guide intensity level when exercising independently       Able to understand and use Dyspnea scale  Yes       Intervention  Provide education and explanation on how to use Dyspnea scale       Expected Outcomes  Short Term: Able to use Dyspnea scale daily in rehab to express subjective sense of shortness of breath during exertion;Long Term: Able to use Dyspnea scale to guide intensity level when exercising independently       Knowledge and understanding of Target Heart Rate Range (THRR)  Yes       Intervention  Provide education and explanation of THRR including how the numbers were predicted and where they are located for reference       Expected Outcomes  Short Term: Able to state/look up THRR;Short Term: Able to use daily as guideline for intensity in rehab;Long Term: Able to use THRR to govern intensity when exercising independently       Understanding of Exercise Prescription  Yes       Intervention  Provide  education, explanation, and written materials on patient's individual exercise prescription       Expected Outcomes  Short Term: Able to explain program exercise prescription;Long Term: Able to explain home exercise prescription to exercise independently          Exercise Goals Re-Evaluation : Exercise Goals Re-Evaluation    Row Name 04/03/17 1136 05/04/17 0852           Exercise Goal Re-Evaluation   Exercise Goals Review  Increase Strength and Stamina;Able to understand and use Dyspnea scale;Knowledge and understanding of Target Heart Rate Range (THRR);Able to understand and use rate of perceived exertion (RPE) scale;Increase Physical Activity;Understanding of Exercise Prescription  Increase Strength and Stamina;Able to understand and use Dyspnea scale;Knowledge and understanding of Target Heart Rate Range (THRR);Able to understand and use rate of perceived exertion (RPE) scale;Increase Physical Activity;Understanding of Exercise Prescription      Comments  Patient's first day of rehab will be 04/07/17.   Patient is progressing slowly in program. Her avg MET level has progressed from 2.0 to 2.9. Will cont. to progress as tolerated.       Expected Outcomes  Through exercise at rehab and at home, patient will increase strength and stamina making ADL's easier to perform. Patient will also have a better understanding of safe exercise and what they are capable to do outside of clinical supervision.  Through exercise at rehab and at home, patient will increase strength and stamina making ADL's easier to perform. Patient will also have a better understanding of safe exercise and what they are capable to do outside of clinical supervision.         Discharge Exercise Prescription (Final Exercise Prescription Changes): Exercise Prescription Changes - 04/28/17 1608      Response to Exercise  Blood Pressure (Admit)  124/70    Blood Pressure (Exercise)  133/90    Blood Pressure (Exit)  108/76    Heart  Rate (Admit)  78 bpm    Heart Rate (Exercise)  102 bpm    Heart Rate (Exit)  76 bpm    Oxygen Saturation (Admit)  98 %    Oxygen Saturation (Exercise)  96 %    Oxygen Saturation (Exit)  96 %    Rating of Perceived Exertion (Exercise)  13    Perceived Dyspnea (Exercise)  2    Duration  Progress to 45 minutes of aerobic exercise without signs/symptoms of physical distress    Intensity  Other (comment) 40-80% HRR      Progression   Progression  Continue to progress workloads to maintain intensity without signs/symptoms of physical distress.      Resistance Training   Training Prescription  Yes    Weight  blue bands    Reps  10-15    Time  10 Minutes      NuStep   Level  3    SPM  80    Minutes  17      Arm Ergometer   Level  3    Minutes  17      Track   Laps  12    Minutes  17       Nutrition:  Target Goals: Understanding of nutrition guidelines, daily intake of sodium <1589m, cholesterol <2082m calories 30% from fat and 7% or less from saturated fats, daily to have 5 or more servings of fruits and vegetables.  Biometrics:    Nutrition Therapy Plan and Nutrition Goals:   Nutrition Assessments: Nutrition Assessments - 04/03/17 1212      Rate Your Plate Scores   Pre Score  55       Nutrition Goals Re-Evaluation:   Nutrition Goals Discharge (Final Nutrition Goals Re-Evaluation):   Psychosocial: Target Goals: Acknowledge presence or absence of significant depression and/or stress, maximize coping skills, provide positive support system. Participant is able to verbalize types and ability to use techniques and skills needed for reducing stress and depression.  Initial Review & Psychosocial Screening: Initial Psych Review & Screening - 04/02/17 0923      Initial Review   Current issues with  None Identified      Family Dynamics   Good Support System?  Yes      Barriers   Psychosocial barriers to participate in program  There are no identifiable  barriers or psychosocial needs.      Screening Interventions   Interventions  Encouraged to exercise       Quality of Life Scores:  Scores of 19 and below usually indicate a poorer quality of life in these areas.  A difference of  2-3 points is a clinically meaningful difference.  A difference of 2-3 points in the total score of the Quality of Life Index has been associated with significant improvement in overall quality of life, self-image, physical symptoms, and general health in studies assessing change in quality of life.   PHQ-9: Recent Review Flowsheet Data    [facility] 04/[facility]2017 04/27/2015   Decreased Interest 0 0   Down, Depressed, Hopeless 0 0   PHQ - 2 Score 0 0     Interpretation of Total Score  Total Score Depression Severity:  1-4 = Minimal depression, 5-9 = Mild depression, 10-14 = Moderate depression, 15-19 = Moderately severe depression, 20-27 = Severe  depression   Psychosocial Evaluation and Intervention: Psychosocial Evaluation - 04/02/17 0926      Psychosocial Evaluation & Interventions   Expected Outcomes  patient will remain free from psychosocial barriers to participation    Continue Psychosocial Services   No Follow up required       Psychosocial Re-Evaluation: Psychosocial Re-Evaluation    [place] Name 04/07/17 1603 04/27/17 1419           Psychosocial Re-Evaluation   Current issues with  None Identified  None Identified      Expected Outcomes  no barriers to participation in pulmonary rehab  no barriers to participation in pulmonary rehab      Interventions  Encouraged to attend Pulmonary Rehabilitation for the exercise  Encouraged to attend Pulmonary Rehabilitation for the exercise      Continue Psychosocial Services   No Follow up required  No Follow up required         Psychosocial Discharge (Final Psychosocial Re-Evaluation): Psychosocial Re-Evaluation - 04/27/17 1419      Psychosocial Re-Evaluation   Current issues with  None  Identified    Expected Outcomes  no barriers to participation in pulmonary rehab    Interventions  Encouraged to attend Pulmonary Rehabilitation for the exercise    Continue Psychosocial Services   No Follow up required       Education: Education Goals: Education classes will be provided on a weekly basis, covering required topics. Participant will state understanding/return demonstration of topics presented.  Learning Barriers/Preferences: Learning Barriers/Preferences - 04/02/17 7939      Learning Barriers/Preferences   Learning Barriers  None    Learning Preferences  Written Material;Verbal Instruction;Group Instruction;Individual Instruction;Computer/Internet       Education Topics: Risk Factor Reduction:  -Group instruction that is supported by a PowerPoint presentation. Instructor discusses the definition of a risk factor, different risk factors for pulmonary disease, and how the heart and lungs work together.     PULMONARY REHAB OTHER RESPIRATORY from 07/26/2015 in [place]  Date  07/26/15  Educator  EP  Instruction Review Code (Retired)  2- meets goals/outcomes      Nutrition for Pulmonary Patient:  -Group instruction provided by [employer], verbal discussion, and written materials to support subject matter. The instructor gives an explanation and review of healthy diet recommendations, which includes a discussion on weight management, recommendations for fruit and vegetable consumption, as well as protein, fluid, caffeine, fiber, sodium, sugar, and alcohol. Tips for eating when patients are short of breath are discussed.   PULMONARY REHAB OTHER RESPIRATORY from 07/26/2015 in [place]  Date  07/26/15  Educator  RD      Pursed Lip Breathing:  -Group instruction that is supported by demonstration and informational handouts. Instructor discusses the benefits of pursed lip and diaphragmatic breathing and  detailed demonstration on how to preform both.     Oxygen Safety:  -Group instruction provided by PowerPoint, verbal discussion, and written material to support subject matter. There is an overview of "What is Oxygen" and "Why do we need it".  Instructor also reviews how to create a safe environment for oxygen use, the importance of using oxygen as prescribed, and the risks of noncompliance. There is a brief discussion on traveling with oxygen and resources the patient may utilize.   PULMONARY REHAB OTHER RESPIRATORY from 07/26/2015 in [place]  Date  05/31/15  Educator  RN  Instruction Review Code (Retired)  2- meets goals/outcomes      Oxygen Equipment:  -Group instruction provided by [employer] Staff utilizing handouts, written materials, and equipment demonstrations.   PULMONARY REHAB OTHER RESPIRATORY from 04/30/2017 in [place]  Date  04/09/17  Educator  [employer]  Instruction Review Code  1- Verbalizes Understanding      Signs and Symptoms:  -Group instruction provided by written material and verbal discussion to support subject matter. Warning signs and symptoms of infection, stroke, and heart attack are reviewed and when to call the physician/911 reinforced. Tips for preventing the spread of infection discussed.   PULMONARY REHAB OTHER RESPIRATORY from 04/30/2017 in [place]  Date  04/23/17  Educator  rn  Instruction Review Code  2- Demonstrated Understanding      Advanced Directives:  -Group instruction provided by verbal instruction and written material to support subject matter. Instructor reviews Advanced Directive laws and proper instruction for filling out document.   PULMONARY REHAB OTHER RESPIRATORY from 07/26/2015 in [place]  Date  07/12/15  Educator  Jeanella Craze  Instruction Review Code (Retired)  2- meets goals/outcomes       Pulmonary Video:  -Group video education that reviews the importance of medication and oxygen compliance, exercise, good nutrition, pulmonary hygiene, and pursed lip and diaphragmatic breathing for the pulmonary patient.   Exercise for the Pulmonary Patient:  -Group instruction that is supported by a PowerPoint presentation. Instructor discusses benefits of exercise, core components of exercise, frequency, duration, and intensity of an exercise routine, importance of utilizing pulse oximetry during exercise, safety while exercising, and options of places to exercise outside of rehab.     PULMONARY REHAB OTHER RESPIRATORY from 04/30/2017 in [place]  Date  04/16/17  Educator  Cloyde Reams  Instruction Review Code  2- Demonstrated Understanding      Pulmonary Medications:  -Verbally interactive group education provided by instructor with focus on inhaled medications and proper administration.   PULMONARY REHAB OTHER RESPIRATORY from 07/26/2015 in [place]  Date  05/24/15  Educator  Pharm D  Instruction Review Code (Retired)  2- Lawyer and Physiology of the Respiratory System and Intimacy:  -Group instruction provided by PowerPoint, verbal discussion, and written material to support subject matter. Instructor reviews respiratory cycle and anatomical components of the respiratory system and their functions. Instructor also reviews differences in obstructive and restrictive respiratory diseases with examples of each. Intimacy, Sex, and Sexuality differences are reviewed with a discussion on how relationships can change when diagnosed with pulmonary disease. Common sexual concerns are reviewed.   PULMONARY REHAB OTHER RESPIRATORY from 07/26/2015 in [place]  Date  07/05/15  Educator  Trish Fountain  Instruction Review Code (Retired)  2- meets goals/outcomes      MD DAY -A  group question and answer session with a medical doctor that allows participants to ask questions that relate to their pulmonary disease state.   OTHER EDUCATION -Group or individual verbal, written, or video instructions that support the educational goals of the pulmonary rehab program.   Holiday Eating Survival Tips:  -Group instruction provided by PowerPoint slides, verbal discussion, and written materials to support subject matter. The instructor gives patients tips, tricks, and techniques to help them not only survive but enjoy the holidays despite the onslaught of food that accompanies the holidays.   Knowledge  Questionnaire Score: Knowledge Questionnaire Score - 04/02/17 0917      Knowledge Questionnaire Score   Pre Score  17/18       Core Components/Risk Factors/Patient Goals at Admission:   Core Components/Risk Factors/Patient Goals Review:  Goals and Risk Factor Review    Row Name 04/07/17 1601 04/27/17 1418           Core Components/Risk Factors/Patient Goals Review   Personal Goals Review  Weight Management/Obesity;Develop more efficient breathing techniques such as purse lipped breathing and diaphragmatic breathing and practicing self-pacing with activity.;Improve shortness of breath with ADL's  Weight Management/Obesity;Develop more efficient breathing techniques such as purse lipped breathing and diaphragmatic breathing and practicing self-pacing with activity.;Improve shortness of breath with ADL's      Review  Just started program, too early to see progress towards goals  Has lost 1 kg, is progressing well, level 3 on nustep, level 3 on arm ergometer, walking 11 laps on track      Expected Outcomes  see admission goals  see admission goals         Core Components/Risk Factors/Patient Goals at Discharge (Final Review):  Goals and Risk Factor Review - 04/27/17 1418      Core Components/Risk Factors/Patient Goals Review   Personal Goals Review  Weight  Management/Obesity;Develop more efficient breathing techniques such as purse lipped breathing and diaphragmatic breathing and practicing self-pacing with activity.;Improve shortness of breath with ADL's    Review  Has lost 1 kg, is progressing well, level 3 on nustep, level 3 on arm ergometer, walking 11 laps on track    Expected Outcomes  see admission goals       ITP Comments:   Comments: patient has attended 7 sessions since admission

## 2017-05-05 ENCOUNTER — Encounter (HOSPITAL_COMMUNITY)
Admission: RE | Admit: 2017-05-05 | Discharge: 2017-05-05 | Disposition: A | Discharge status: Home / Self Care | Payer: Medicare Other | Source: Ambulatory Visit | Attending: Pulmonary Disease | Admitting: Pulmonary Disease

## 2017-05-05 DIAGNOSIS — J479 Bronchiectasis, uncomplicated: Secondary | ICD-10-CM | POA: Diagnosis not present

## 2017-05-05 NOTE — Progress Notes (Signed)
Daily Session Note  Patient Details  Name: Brooke Jimenez MRN: [id number] Date of Birth: 08-02-1945 Referring Provider:     Pulmonary Rehab Walk Test from 04/03/2017 in [place]  Referring Provider  Dr. Lake Bells      Encounter Date: 05/05/2017  Check In: Session Check In - 05/05/17 1330      Check-In   Location  [facility]    Staff Present  Su Hilt, MS, ACSM RCEP, Exercise Physiologist;Portia Rollene Rotunda, RN, Roque Cash, RN;Other    Supervising physician immediately available to respond to emergencies  [facility] immediately available    Physician(s)  Dr. Horris Latino    Medication changes reported      No    Fall or balance concerns reported     No    Tobacco Cessation  No Change    Warm-up and Cool-down  Performed as group-led instruction    Resistance Training Performed  Yes    VAD Patient?  No      Pain Assessment   Currently in Pain?  No/denies    Multiple Pain Sites  No       Capillary Blood Glucose: No results found for this or any previous visit (from the past 24 hour(s)).    Social History   Tobacco Use  Smoking Status Never Smoker  Smokeless Tobacco Never Used    Goals Met:  Exercise tolerated well No report of cardiac concerns or symptoms Strength training completed today  Goals Unmet:  Not Applicable  Comments: Service time is from 1:30p to 3:30p    Dr. Rush Farmer is Medical Director for Pulmonary Rehab at [facility].

## 2017-05-07 ENCOUNTER — Encounter (HOSPITAL_COMMUNITY)
Admission: RE | Admit: 2017-05-07 | Discharge: 2017-05-07 | Disposition: A | Discharge status: Home / Self Care | Payer: Medicare Other | Source: Ambulatory Visit | Attending: Pulmonary Disease | Admitting: Pulmonary Disease

## 2017-05-07 DIAGNOSIS — J479 Bronchiectasis, uncomplicated: Secondary | ICD-10-CM

## 2017-05-07 NOTE — Progress Notes (Signed)
Daily Session Note  Patient Details  Name: Brooke Jimenez MRN: [id number] Date of Birth: 01-07-1946 Referring Provider:     Pulmonary Rehab Walk Test from 04/03/2017 in [place]  Referring Provider  Dr. Lake Bells      Encounter Date: 05/07/2017  Check In: Session Check In - 05/07/17 1352      Check-In   Location  [facility]    Staff Present  Rodney Langton, RN;Portia Rollene Rotunda, RN, BSN;Molly diVincenzo, MS, ACSM RCEP, Exercise Physiologist    Supervising physician immediately available to respond to emergencies  [facility] immediately available    Physician(s)  Dr. Wendee Beavers    Medication changes reported      No    Fall or balance concerns reported     No    Tobacco Cessation  No Change    Warm-up and Cool-down  Performed as group-led instruction    Resistance Training Performed  Yes    VAD Patient?  No      Pain Assessment   Currently in Pain?  No/denies    Multiple Pain Sites  No       Capillary Blood Glucose: No results found for this or any previous visit (from the past 24 hour(s)).    Social History   Tobacco Use  Smoking Status Never Smoker  Smokeless Tobacco Never Used    Goals Met:  Exercise tolerated well No report of cardiac concerns or symptoms Strength training completed today  Goals Unmet:  Not Applicable  Comments: Service time is from 1330 to 1545    Dr. Rush Farmer is Medical Director for Pulmonary Rehab at [facility].

## 2017-05-12 ENCOUNTER — Encounter (HOSPITAL_COMMUNITY)
Admission: RE | Admit: 2017-05-12 | Discharge: 2017-05-12 | Disposition: A | Discharge status: Home / Self Care | Payer: Medicare Other | Source: Ambulatory Visit | Attending: Pulmonary Disease | Admitting: Pulmonary Disease

## 2017-05-12 DIAGNOSIS — J479 Bronchiectasis, uncomplicated: Secondary | ICD-10-CM

## 2017-05-12 DIAGNOSIS — J471 Bronchiectasis with (acute) exacerbation: Secondary | ICD-10-CM

## 2017-05-12 NOTE — Progress Notes (Signed)
Daily Session Note  Patient Details  Name: Brooke Jimenez MRN: [id number] Date of Birth: [birth year] Referring Provider:     Pulmonary Rehab Walk Test from 04/03/2017 in [place]  Referring Provider  Dr. Lake Bells      Encounter Date: 05/12/2017  Check In: Session Check In - 05/12/17 1330      Check-In   Location  [facility]    Staff Present  Rosebud Poles, RN, BSN;Molly diVincenzo, MS, ACSM RCEP, Exercise Physiologist;Annedrea Rosezella [place], RN, MHA;Portia Rollene Rotunda, RN, BSN    Supervising physician immediately available to respond to emergencies  [facility] immediately available    Physician(s)  Dr. Wendee Beavers    Medication changes reported      No    Fall or balance concerns reported     No    Tobacco Cessation  No Change    Warm-up and Cool-down  Performed as group-led instruction    Resistance Training Performed  Yes    VAD Patient?  No      Pain Assessment   Currently in Pain?  No/denies    Multiple Pain Sites  No       Capillary Blood Glucose: No results found for this or any previous visit (from the past 24 hour(s)).    Social History   Tobacco Use  Smoking Status Never Smoker  Smokeless Tobacco Never Used    Goals Met:  Exercise tolerated well Strength training completed today  Goals Unmet:  Not Applicable  Comments: Service time is from 1330 to 1515.    Dr. Rush Farmer is Medical Director for Pulmonary Rehab at [facility].

## 2017-05-14 ENCOUNTER — Encounter (HOSPITAL_COMMUNITY)
Admission: RE | Admit: 2017-05-14 | Discharge: 2017-05-14 | Disposition: A | Discharge status: Home / Self Care | Payer: Medicare Other | Source: Ambulatory Visit | Attending: Pulmonary Disease | Admitting: Pulmonary Disease

## 2017-05-14 VITALS — Wt 171.5 lb

## 2017-05-14 DIAGNOSIS — J479 Bronchiectasis, uncomplicated: Secondary | ICD-10-CM | POA: Diagnosis not present

## 2017-05-14 DIAGNOSIS — J471 Bronchiectasis with (acute) exacerbation: Secondary | ICD-10-CM

## 2017-05-14 NOTE — Progress Notes (Signed)
Daily Session Note  Patient Details  Name: Brooke Jimenez MRN: [id number] Date of Birth: 04/12/1945 Referring Provider:     Pulmonary Rehab Walk Test from 04/03/2017 in [place]  Referring Provider  Dr. Lake Bells      Encounter Date: 05/14/2017  Check In: Session Check In - 05/14/17 1328      Check-In   Location  [facility]    Staff Present  Rosebud Poles, RN, BSN;Hakan Nudelman, MS, ACSM RCEP, Exercise Physiologist;Portia Rollene Rotunda, RN, Roque Cash, RN    Supervising physician immediately available to respond to emergencies  [facility] immediately available    Physician(s)  Dr. Lonny Prude    Medication changes reported      No    Fall or balance concerns reported     No    Tobacco Cessation  No Change    Warm-up and Cool-down  Performed as group-led instruction    Resistance Training Performed  Yes    VAD Patient?  No      Pain Assessment   Currently in Pain?  No/denies    Multiple Pain Sites  No       Capillary Blood Glucose: No results found for this or any previous visit (from the past 24 hour(s)).  Exercise Prescription Changes - 05/14/17 1600      Response to Exercise   Blood Pressure (Admit)  124/80    Blood Pressure (Exercise)  134/60    Blood Pressure (Exit)  140/80    Heart Rate (Admit)  62 bpm    Heart Rate (Exercise)  105 bpm    Heart Rate (Exit)  82 bpm    Oxygen Saturation (Admit)  95 %    Oxygen Saturation (Exercise)  97 %    Oxygen Saturation (Exit)  94 %    Rating of Perceived Exertion (Exercise)  13    Perceived Dyspnea (Exercise)  2    Duration  Progress to 45 minutes of aerobic exercise without signs/symptoms of physical distress    Intensity  Other (comment) 40-80% HRR      Progression   Progression  Continue to progress workloads to maintain intensity without signs/symptoms of physical distress.      Resistance Training   Training Prescription  Yes    Weight  blue bands    Reps  10-15     Time  10 Minutes      Arm Ergometer   Level  4    Minutes  17      Track   Laps  16    Minutes  17       Social History   Tobacco Use  Smoking Status Never Smoker  Smokeless Tobacco Never Used    Goals Met:  Exercise tolerated well No report of cardiac concerns or symptoms Strength training completed today  Goals Unmet:  Not Applicable  Comments: Service time is from 1:30 to 15:40p    Dr. Rush Farmer is Medical Director for Pulmonary Rehab at [facility].

## 2017-05-19 ENCOUNTER — Encounter (HOSPITAL_COMMUNITY)
Admission: RE | Admit: 2017-05-19 | Discharge: 2017-05-19 | Disposition: A | Discharge status: Home / Self Care | Payer: Medicare Other | Source: Ambulatory Visit | Attending: Pulmonary Disease | Admitting: Pulmonary Disease

## 2017-05-19 DIAGNOSIS — J479 Bronchiectasis, uncomplicated: Secondary | ICD-10-CM

## 2017-05-19 DIAGNOSIS — J471 Bronchiectasis with (acute) exacerbation: Secondary | ICD-10-CM

## 2017-05-19 NOTE — Progress Notes (Signed)
Daily Session Note  Patient Details  Name: Brooke Jimenez MRN: [id number] Date of Birth: 08/12/1945 Referring Provider:     Pulmonary Rehab Walk Test from 04/03/2017 in [facility]  Referring Provider  Dr. McQuaid      Encounter Date: 05/19/2017  Check In: Session Check In - 05/19/17 1333      Check-In   Location  [facility]    Staff Present  Molly diVincenzo, MS, ACSM RCEP, Exercise Physiologist;Portia Payne, RN, BSN;Lisa Hughes, RN    Supervising physician immediately available to respond to emergencies  [facility] immediately available    Physician(s)  Dr. Nettey    Medication changes reported      No    Fall or balance concerns reported     No    Tobacco Cessation  No Change    Warm-up and Cool-down  Performed as group-led instruction    Resistance Training Performed  Yes    VAD Patient?  No      Pain Assessment   Currently in Pain?  No/denies    Multiple Pain Sites  No       Capillary Blood Glucose: No results found for this or any previous visit (from the past 24 hour(s)).    Social History   Tobacco Use  Smoking Status Never Smoker  Smokeless Tobacco Never Used    Goals Met:  Exercise tolerated well No report of cardiac concerns or symptoms Strength training completed today  Goals Unmet:  Not Applicable  Comments: Service time is from 1330 to 1515    Dr. Wesam G. Yacoub is Medical Director for Pulmonary Rehab at [facility]. 

## 2017-05-21 ENCOUNTER — Encounter (HOSPITAL_COMMUNITY)
Admission: RE | Admit: 2017-05-21 | Discharge: 2017-05-21 | Disposition: A | Discharge status: Home / Self Care | Payer: Medicare Other | Source: Ambulatory Visit | Attending: Pulmonary Disease | Admitting: Pulmonary Disease

## 2017-05-21 DIAGNOSIS — J479 Bronchiectasis, uncomplicated: Secondary | ICD-10-CM

## 2017-05-21 DIAGNOSIS — J471 Bronchiectasis with (acute) exacerbation: Secondary | ICD-10-CM

## 2017-05-21 NOTE — Progress Notes (Signed)
Daily Session Note  Patient Details  Name: Brooke Jimenez MRN: [id number] Date of Birth: Jul 16, 1945 Referring Provider:     Pulmonary Rehab Walk Test from 04/03/2017 in [place]  Referring Provider  Dr. Lake Bells      Encounter Date: 05/21/2017  Check In: Session Check In - 05/21/17 1543      Check-In   Location  [facility]    Staff Present  Trish Fountain, RN, BSN;Molly diVincenzo, MS, ACSM RCEP, Exercise Physiologist;Gaylen Venning Ysidro Evert, RN    Supervising physician immediately available to respond to emergencies  [facility] immediately available    Physician(s)  Dr. Eliseo Squires    Medication changes reported      No    Fall or balance concerns reported     No    Tobacco Cessation  No Change    Warm-up and Cool-down  Performed as group-led instruction    Resistance Training Performed  Yes    VAD Patient?  No      Pain Assessment   Currently in Pain?  No/denies    Multiple Pain Sites  No       Capillary Blood Glucose: No results found for this or any previous visit (from the past 24 hour(s)).    Social History   Tobacco Use  Smoking Status Never Smoker  Smokeless Tobacco Never Used    Goals Met:  Exercise tolerated well No report of cardiac concerns or symptoms Strength training completed today  Goals Unmet:  Not Applicable  Comments: Service time is from 1330 to 1520    Dr. Rush Farmer is Medical Director for Pulmonary Rehab at [facility].

## 2017-05-26 ENCOUNTER — Encounter (HOSPITAL_COMMUNITY)
Admission: RE | Admit: 2017-05-26 | Discharge: 2017-05-26 | Disposition: A | Discharge status: Home / Self Care | Payer: Medicare Other | Source: Ambulatory Visit | Attending: Pulmonary Disease | Admitting: Pulmonary Disease

## 2017-05-26 VITALS — Wt 172.2 lb

## 2017-05-26 DIAGNOSIS — J479 Bronchiectasis, uncomplicated: Secondary | ICD-10-CM | POA: Insufficient documentation

## 2017-05-26 NOTE — Progress Notes (Signed)
Daily Session Note  Patient Details  Name: Brooke Jimenez MRN: [id number] Date of Birth: 25-Apr-1945 Referring Provider:     Pulmonary Rehab Walk Test from 04/03/2017 in [place]  Referring Provider  Dr. Lake Bells      Encounter Date: 05/26/2017  Check In: Session Check In - 05/26/17 1518      Check-In   Location  [facility]    Staff Present  Trish Fountain, RN, BSN;Molly diVincenzo, MS, ACSM RCEP, Exercise Physiologist;Lisa Ysidro Evert, RN    Supervising physician immediately available to respond to emergencies  [facility] immediately available    Physician(s)  Dr. Posey Pronto    Medication changes reported      No    Fall or balance concerns reported     No    Tobacco Cessation  No Change    Warm-up and Cool-down  Performed as group-led instruction    Resistance Training Performed  Yes    VAD Patient?  No      Pain Assessment   Currently in Pain?  No/denies    Multiple Pain Sites  No       Capillary Blood Glucose: No results found for this or any previous visit (from the past 24 hour(s)).  Exercise Prescription Changes - 05/26/17 1606      Response to Exercise   Blood Pressure (Admit)  160/80    Blood Pressure (Exercise)  170/80    Blood Pressure (Exit)  120/80    Heart Rate (Admit)  75 bpm    Heart Rate (Exercise)  103 bpm    Heart Rate (Exit)  78 bpm    Oxygen Saturation (Admit)  98 %    Oxygen Saturation (Exercise)  96 %    Oxygen Saturation (Exit)  94 %    Rating of Perceived Exertion (Exercise)  12    Perceived Dyspnea (Exercise)  2    Duration  Progress to 45 minutes of aerobic exercise without signs/symptoms of physical distress    Intensity  Other (comment) 40-80% HRR      Progression   Progression  Continue to progress workloads to maintain intensity without signs/symptoms of physical distress.      Resistance Training   Training Prescription  Yes    Weight  blue bands    Reps  10-15    Time  10 Minutes      NuStep   Level  5    Minutes  17    METs  2.3      Arm Ergometer   Level  5    Minutes  17      Track   Laps  14    Minutes  17       Social History   Tobacco Use  Smoking Status Never Smoker  Smokeless Tobacco Never Used    Goals Met:  Independence with exercise equipment Improved SOB with ADL's Using PLB without cueing & demonstrates good technique Exercise tolerated well No report of cardiac concerns or symptoms Strength training completed today  Goals Unmet:  Not Applicable  Comments: Service time is from 1330 to 1515   Dr. Rush Farmer is Medical Director for Pulmonary Rehab at [facility].

## 2017-05-28 ENCOUNTER — Encounter (HOSPITAL_COMMUNITY)
Admission: RE | Admit: 2017-05-28 | Discharge: 2017-05-28 | Disposition: A | Discharge status: Home / Self Care | Payer: Medicare Other | Source: Ambulatory Visit | Attending: Pulmonary Disease | Admitting: Pulmonary Disease

## 2017-05-28 DIAGNOSIS — J471 Bronchiectasis with (acute) exacerbation: Secondary | ICD-10-CM

## 2017-05-28 DIAGNOSIS — J479 Bronchiectasis, uncomplicated: Secondary | ICD-10-CM

## 2017-05-28 NOTE — Progress Notes (Addendum)
Daily Session Note  Patient Details  Name: Nastassia Bazaldua MRN: [id number] Date of Birth: Jan 29, 1946 Referring Provider:     Pulmonary Rehab Walk Test from 04/03/2017 in [place]  Referring Provider  Dr. Lake Bells      Encounter Date: 05/28/2017  Check In: Session Check In - 05/28/17 1533      Check-In   Location  [facility]    Staff Present  Trish Fountain, RN, BSN;Molly diVincenzo, MS, ACSM RCEP, Exercise Physiologist    Supervising physician immediately available to respond to emergencies  [facility] immediately available    Physician(s)  Dr. Lonny Prude    Medication changes reported      No    Fall or balance concerns reported     No    Tobacco Cessation  No Change    Warm-up and Cool-down  Performed as group-led instruction    Resistance Training Performed  Yes    VAD Patient?  No      Pain Assessment   Currently in Pain?  No/denies    Multiple Pain Sites  No       Capillary Blood Glucose: No results found for this or any previous visit (from the past 24 hour(s)).    Social History   Tobacco Use  Smoking Status Never Smoker  Smokeless Tobacco Never Used    Goals Met:  Exercise tolerated well No report of cardiac concerns or symptoms Strength training completed today  Goals Unmet:  Not Applicable  Comments: Service time is from 1330 to 1510    Dr. Rush Farmer is Medical Director for Pulmonary Rehab at [facility].

## 2017-06-02 ENCOUNTER — Encounter (HOSPITAL_COMMUNITY)
Admission: RE | Admit: 2017-06-02 | Discharge: 2017-06-02 | Disposition: A | Discharge status: Home / Self Care | Payer: Medicare Other | Source: Ambulatory Visit | Attending: Pulmonary Disease | Admitting: Pulmonary Disease

## 2017-06-02 DIAGNOSIS — J479 Bronchiectasis, uncomplicated: Secondary | ICD-10-CM | POA: Diagnosis not present

## 2017-06-02 NOTE — Progress Notes (Signed)
Pulmonary Individual Treatment Plan  Patient Details  Name: Brooke Jimenez MRN: [id number] Date of Birth: 1946-01-02 Referring Provider:     Pulmonary Rehab Walk Test from 04/03/2017 in [place]  Referring Provider  Dr. Lake Bells      Initial Encounter Date:    Pulmonary Rehab Walk Test from 04/03/2017 in [place]  Date  04/03/17  Referring Provider  Dr. Lake Bells      Visit Diagnosis: Bronchiectasis without complication ([place])  Patient's Home Medications on Admission:   Current Outpatient Medications:  .  calcium carbonate (TUMS - DOSED IN MG ELEMENTAL CALCIUM) 500 MG chewable tablet, Chew 3 tablets by mouth daily as needed for indigestion or heartburn. , Disp: , Rfl:  .  clindamycin (CLEOCIN) 150 MG capsule, Take 600 mg by mouth. One hour before dental appointments, Disp: , Rfl:  .  gabapentin (NEURONTIN) 100 MG capsule, Take 3 capsules (300 mg total) by mouth 2 (two) times daily. (Patient taking differently: Take 300 mg by mouth at bedtime. ), Disp: 30 capsule, Rfl: 0 .  levothyroxine (SYNTHROID, LEVOTHROID) 75 MCG tablet, Take 75 mcg by mouth daily before breakfast., Disp: , Rfl:  .  lidocaine (XYLOCAINE) 5 % ointment, Apply one inch of ointment to the painful area in the flank twice a day (Patient not taking: Reported on 04/02/2017), Disp: 35.44 g, Rfl: 5 .  Multiple Vitamin (MULTIVITAMIN WITH MINERALS) TABS tablet, Take 1 tablet by mouth daily., Disp: , Rfl:   Past Medical History: Past Medical History:  Diagnosis Date  . Acne rosacea   . Anxiety   . Arthritis    arthritis in hip   . Bronchiectasis (Grandfather)   . Cataract    beginning  . Complication of anesthesia    "really sore throat"  . Corneal dystrophy   . DDD (degenerative disc disease), cervical    and lower back  . Diverticulosis   . Dysphagia    chronic- please see ultrasound done 4/16 15 in [facility]   . Dysrhythmia    sometimes patient has extra beats  per patient   . Esophageal dysmotility   . Family history of adverse reaction to anesthesia    mother slow to wake up   . GERD (gastroesophageal reflux disease)    under control  . Glaucoma    "suspect"  . Gout    one finger x1 attack  . H/O hiatal hernia   . 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 wears hearing aides  . Heart murmur    no problem   . History of chicken pox   . History of melanoma    melanoma- 1986 and 1997   . Hypothyroidism   . Low back pain with sciatica   . Measles    hx of  . Mitral valve prolapse   . Nocturia   . Osteoporosis   . Peripheral neuropathy    feet  . PONV (postoperative nausea and vomiting)   . PTSD (post-traumatic stress disorder) 2006  . RSD (reflex sympathetic dystrophy) 1993  . Spider veins   . Urinary incontinence    occasional  . Varicose veins     Tobacco Use: Social History   Tobacco Use  Smoking Status Never Smoker  Smokeless Tobacco Never Used    Labs: Recent Review Flowsheet Data    There is no flowsheet data to display.      Capillary Blood Glucose: No results found for: GLUCAP  Pulmonary Assessment Scores: Pulmonary Assessment Scores    Row Name 04/09/17 1628         ADL UCSD   ADL Phase  Entry     SOB Score total  41       CAT Score   CAT Score  16 Entry        Pulmonary Function Assessment: Pulmonary Function Assessment - 04/02/17 0920      Breath   Bilateral Breath Sounds  Clear    Shortness of Breath  Yes;Limiting activity       Exercise Target Goals:    Exercise Program Goal: Individual exercise prescription set using results from initial 6 min walk test and THRR while considering  patient's activity barriers and safety.    Exercise Prescription Goal: Initial exercise prescription builds to 30-45 minutes a day of aerobic activity, 2-3 days per week.  Home exercise guidelines will be given to patient during program as part of exercise prescription that the participant will  acknowledge.  Activity Barriers & Risk Stratification: Activity Barriers & Cardiac Risk Stratification - 04/02/17 0908      Activity Barriers & Cardiac Risk Stratification   Activity Barriers  Shortness of Breath;Left Hip Replacement       6 Minute Walk: 6 Minute Walk    Row Name 04/06/17 0800         6 Minute Walk   Phase  Initial     Distance  1700 feet     Walk Time  6 minutes     # of Rest Breaks  0     MPH  3.21     METS  3.45     RPE  13     Perceived Dyspnea   3     Symptoms  No     Resting HR  88 bpm     Resting BP  110/80     Resting Oxygen Saturation   98 %     Exercise Oxygen Saturation  during 6 min walk  91 %     Max Ex. HR  180 bpm     Max Ex. BP  174/90     2 Minute Post BP  138/82       Interval HR   1 Minute HR  110     2 Minute HR  118     3 Minute HR  130     4 Minute HR  128     5 Minute HR  160     6 Minute HR  180     2 Minute Post HR  103     Interval Heart Rate?  Yes       Interval Oxygen   Interval Oxygen?  Yes     Baseline Oxygen Saturation %  98 %     1 Minute Oxygen Saturation %  98 %     1 Minute Liters of Oxygen  0 L     2 Minute Oxygen Saturation %  98 %     2 Minute Liters of Oxygen  0 L     3 Minute Oxygen Saturation %  96 %     3 Minute Liters of Oxygen  0 L     4 Minute Oxygen Saturation %  95 %     4 Minute Liters of Oxygen  0 L     5 Minute Oxygen Saturation %  91 %     5 Minute Liters of Oxygen  0 L     6 Minute Oxygen Saturation %  97 %     6 Minute Liters of Oxygen  0 L     2 Minute Post Oxygen Saturation %  100 %     2 Minute Post Liters of Oxygen  0 L        Oxygen Initial Assessment: Oxygen Initial Assessment - 04/06/17 0802      Home Oxygen   Home Oxygen Device  None    Sleep Oxygen Prescription  None    Home Exercise Oxygen Prescription  None    Home at Rest Exercise Oxygen Prescription  None      Initial 6 min Walk   Oxygen Used  None      Program Oxygen Prescription   Program Oxygen  Prescription  None       Oxygen Re-Evaluation: Oxygen Re-Evaluation    Row Name 04/03/17 1137 05/04/17 0851 05/29/17 1545         Program Oxygen Prescription   Program Oxygen Prescription  None  None  None       Home Oxygen   Home Oxygen Device  None  None  None     Sleep Oxygen Prescription  None  None  None     Home Exercise Oxygen Prescription  None  None  None     Home at Rest Exercise Oxygen Prescription  None  None  None        Oxygen Discharge (Final Oxygen Re-Evaluation): Oxygen Re-Evaluation - 05/29/17 1545      Program Oxygen Prescription   Program Oxygen Prescription  None      Home Oxygen   Home Oxygen Device  None    Sleep Oxygen Prescription  None    Home Exercise Oxygen Prescription  None    Home at Rest Exercise Oxygen Prescription  None       Initial Exercise Prescription: Initial Exercise Prescription - 04/06/17 0800      Date of Initial Exercise RX and Referring Provider   Date  04/03/17    Referring Provider  Dr. Lake Bells      Treadmill   MPH  1.7    Grade  0    Minutes  17      NuStep   Level  2    SPM  80    Minutes  17    METs  1.5      Arm Ergometer   Level  2    Watts  10    Minutes  17      Prescription Details   Frequency (times per week)  2    Duration  Progress to 45 minutes of aerobic exercise without signs/symptoms of physical distress      Intensity   THRR 40-80% of Max Heartrate  59-118    Ratings of Perceived Exertion  11-15    Perceived Dyspnea  0-4      Progression   Progression  Continue to progress workloads to maintain intensity without signs/symptoms of physical distress.      Resistance Training   Training Prescription  Yes    Weight  blue bands    Reps  10-15       Perform Capillary Blood Glucose checks as needed.  Exercise Prescription Changes: Exercise Prescription Changes    Row Name 04/09/17 1608 04/21/17 1600 04/28/17 1608 05/14/17 1600 05/26/17 1606     Response to Exercise   Blood  Pressure (Admit)  144/80  -  124/70  124/80  160/80   Blood Pressure (Exercise)  144/84  -  133/90  134/60  170/80   Blood Pressure (Exit)  112/74  -  108/76  140/80  120/80   Heart Rate (Admit)  70 bpm  -  78 bpm  62 bpm  75 bpm   Heart Rate (Exercise)  90 bpm  -  102 bpm  105 bpm  103 bpm   Heart Rate (Exit)  73 bpm  -  76 bpm  82 bpm  78 bpm   Oxygen Saturation (Admit)  97 %  -  98 %  95 %  98 %   Oxygen Saturation (Exercise)  97 %  -  96 %  97 %  96 %   Oxygen Saturation (Exit)  96 %  -  96 %  94 %  94 %   Rating of Perceived Exertion (Exercise)  13  -  '13  13  12   '$ Perceived Dyspnea (Exercise)  1  -  '2  2  2   '$ Duration  Progress to 45 minutes of aerobic exercise without signs/symptoms of physical distress  -  Progress to 45 minutes of aerobic exercise without signs/symptoms of physical distress  Progress to 45 minutes of aerobic exercise without signs/symptoms of physical distress  Progress to 45 minutes of aerobic exercise without signs/symptoms of physical distress   Intensity  Other (comment) 40-80% HRR  -  Other (comment) 40-80% HRR  Other (comment) 40-80% HRR  Other (comment) 40-80% HRR     Progression   Progression  Continue to progress workloads to maintain intensity without signs/symptoms of physical distress.  -  Continue to progress workloads to maintain intensity without signs/symptoms of physical distress.  Continue to progress workloads to maintain intensity without signs/symptoms of physical distress.  Continue to progress workloads to maintain intensity without signs/symptoms of physical distress.     Resistance Training   Training Prescription  Yes  -  Yes  Yes  Yes   Weight  blue bands  -  blue bands  blue bands  blue bands   Reps  10-15  -  10-15  10-15  10-15   Time  10 Minutes  -  10 Minutes  10 Minutes  10 Minutes     NuStep   Level  3  -  3  -  5   SPM  80  -  80  -  -   Minutes  17  -  17  -  17   METs  2  -  -  -  2.3     Arm Ergometer   Level  -  -  '3  4  5    '$ Minutes  -  -  '17  17  17     '$ Track   Laps  11  -  '12  16  14   '$ Minutes  17  -  '17  17  17     '$ Home Exercise Plan   Plans to continue exercise at  [facility] (comment)  -  -  -   Frequency  -  Add 2 additional days to program exercise sessions.  -  -  -      Exercise Comments: Exercise Comments    Row Name 04/21/17 1630           Exercise Comments  Home exercise completed  Exercise Goals and Review: Exercise Goals    Row Name 04/02/17 0909             Exercise Goals   Increase Physical Activity  Yes       Intervention  Provide advice, education, support and counseling about physical activity/exercise needs.;Develop an individualized exercise prescription for aerobic and resistive training based on initial evaluation findings, risk stratification, comorbidities and participant's personal goals.       Expected Outcomes  Short Term: Attend rehab on a regular basis to increase amount of physical activity.       Increase Strength and Stamina  Yes       Intervention  Provide advice, education, support and counseling about physical activity/exercise needs.;Develop an individualized exercise prescription for aerobic and resistive training based on initial evaluation findings, risk stratification, comorbidities and participant's personal goals.       Expected Outcomes  Short Term: Increase workloads from initial exercise prescription for resistance, speed, and METs.       Able to understand and use rate of perceived exertion (RPE) scale  Yes       Intervention  Provide education and explanation on how to use RPE scale       Expected Outcomes  Short Term: Able to use RPE daily in rehab to express subjective intensity level;Long Term:  Able to use RPE to guide intensity level when exercising independently       Able to understand and use Dyspnea scale  Yes       Intervention  Provide education and explanation on how to use Dyspnea scale       Expected Outcomes   Short Term: Able to use Dyspnea scale daily in rehab to express subjective sense of shortness of breath during exertion;Long Term: Able to use Dyspnea scale to guide intensity level when exercising independently       Knowledge and understanding of Target Heart Rate Range (THRR)  Yes       Intervention  Provide education and explanation of THRR including how the numbers were predicted and where they are located for reference       Expected Outcomes  Short Term: Able to state/look up THRR;Short Term: Able to use daily as guideline for intensity in rehab;Long Term: Able to use THRR to govern intensity when exercising independently       Understanding of Exercise Prescription  Yes       Intervention  Provide education, explanation, and written materials on patient's individual exercise prescription       Expected Outcomes  Short Term: Able to explain program exercise prescription;Long Term: Able to explain home exercise prescription to exercise independently          Exercise Goals Re-Evaluation : Exercise Goals Re-Evaluation    Row Name 04/03/17 1136 05/04/17 0852 05/29/17 1545         Exercise Goal Re-Evaluation   Exercise Goals Review  Increase Strength and Stamina;Able to understand and use Dyspnea scale;Knowledge and understanding of Target Heart Rate Range (THRR);Able to understand and use rate of perceived exertion (RPE) scale;Increase Physical Activity;Understanding of Exercise Prescription  Increase Strength and Stamina;Able to understand and use Dyspnea scale;Knowledge and understanding of Target Heart Rate Range (THRR);Able to understand and use rate of perceived exertion (RPE) scale;Increase Physical Activity;Understanding of Exercise Prescription  Increase Strength and Stamina;Able to understand and use Dyspnea scale;Knowledge and understanding of Target Heart Rate Range (THRR);Able to understand and use rate of perceived exertion (RPE) scale;Increase Physical Activity;Understanding  of  Exercise Prescription     Comments  Patient's first day of rehab will be 04/07/17.   Patient is progressing slowly in program. Her avg MET level has progressed from 2.0 to 2.9. Will cont. to progress as tolerated.   Patient is able to walk 13 laps (200 ft each) in 15 minutes. Met level places her in a low functional level. Will cont. to motivate and progress as able.      Expected Outcomes  Through exercise at rehab and at home, patient will increase strength and stamina making ADL's easier to perform. Patient will also have a better understanding of safe exercise and what they are capable to do outside of clinical supervision.  Through exercise at rehab and at home, patient will increase strength and stamina making ADL's easier to perform. Patient will also have a better understanding of safe exercise and what they are capable to do outside of clinical supervision.  Through exercise at rehab and at home, patient will increase strength and stamina making ADL's easier to perform. Patient will also have a better understanding of safe exercise and what they are capable to do outside of clinical supervision.        Discharge Exercise Prescription (Final Exercise Prescription Changes): Exercise Prescription Changes - 05/26/17 1606      Response to Exercise   Blood Pressure (Admit)  160/80    Blood Pressure (Exercise)  170/80    Blood Pressure (Exit)  120/80    Heart Rate (Admit)  75 bpm    Heart Rate (Exercise)  103 bpm    Heart Rate (Exit)  78 bpm    Oxygen Saturation (Admit)  98 %    Oxygen Saturation (Exercise)  96 %    Oxygen Saturation (Exit)  94 %    Rating of Perceived Exertion (Exercise)  12    Perceived Dyspnea (Exercise)  2    Duration  Progress to 45 minutes of aerobic exercise without signs/symptoms of physical distress    Intensity  Other (comment) 40-80% HRR      Progression   Progression  Continue to progress workloads to maintain intensity without signs/symptoms of physical  distress.      Resistance Training   Training Prescription  Yes    Weight  blue bands    Reps  10-15    Time  10 Minutes      NuStep   Level  5    Minutes  17    METs  2.3      Arm Ergometer   Level  5    Minutes  17      Track   Laps  14    Minutes  17       Nutrition:  Target Goals: Understanding of nutrition guidelines, daily intake of sodium '1500mg'$ , cholesterol '200mg'$ , calories 30% from fat and 7% or less from saturated fats, daily to have 5 or more servings of fruits and vegetables.  Biometrics:    Nutrition Therapy Plan and Nutrition Goals:   Nutrition Assessments: Nutrition Assessments - 04/03/17 1212      Rate Your Plate Scores   Pre Score  55       Nutrition Goals Re-Evaluation:   Nutrition Goals Discharge (Final Nutrition Goals Re-Evaluation):   Psychosocial: Target Goals: Acknowledge presence or absence of significant depression and/or stress, maximize coping skills, provide positive support system. Participant is able to verbalize types and ability to use techniques and skills needed for reducing stress and depression.  Initial Review & Psychosocial Screening: Initial Psych Review & Screening - 04/02/17 0923      Initial Review   Current issues with  None Identified      Family Dynamics   Good Support System?  Yes      Barriers   Psychosocial barriers to participate in program  There are no identifiable barriers or psychosocial needs.      Screening Interventions   Interventions  Encouraged to exercise       Quality of Life Scores:  Scores of 19 and below usually indicate a poorer quality of life in these areas.  A difference of  2-3 points is a clinically meaningful difference.  A difference of 2-3 points in the total score of the Quality of Life Index has been associated with significant improvement in overall quality of life, self-image, physical symptoms, and general health in studies assessing change in quality of  life.   PHQ-9: Recent Review Flowsheet Data    Depression screen [facility] 2/9 04/02/2017 04/27/2015   Decreased Interest 0 0   Down, Depressed, Hopeless 0 0   PHQ - 2 Score 0 0     Interpretation of Total Score  Total Score Depression Severity:  1-4 = Minimal depression, 5-9 = Mild depression, 10-14 = Moderate depression, 15-19 = Moderately severe depression, 20-27 = Severe depression   Psychosocial Evaluation and Intervention: Psychosocial Evaluation - 04/02/17 0926      Psychosocial Evaluation & Interventions   Expected Outcomes  patient will remain free from psychosocial barriers to participation    Continue Psychosocial Services   No Follow up required       Psychosocial Re-Evaluation: Psychosocial Re-Evaluation    [place] Name 04/07/17 1603 04/27/17 1419 06/02/17 0814         Psychosocial Re-Evaluation   Current issues with  None Identified  None Identified  None Identified     Expected Outcomes  no barriers to participation in pulmonary rehab  no barriers to participation in pulmonary rehab  no barriers to participation in pulmonary rehab     Interventions  Encouraged to attend Pulmonary Rehabilitation for the exercise  Encouraged to attend Pulmonary Rehabilitation for the exercise  Encouraged to attend Pulmonary Rehabilitation for the exercise     Continue Psychosocial Services   No Follow up required  No Follow up required  No Follow up required        Psychosocial Discharge (Final Psychosocial Re-Evaluation): Psychosocial Re-Evaluation - 06/02/17 0814      Psychosocial Re-Evaluation   Current issues with  None Identified    Expected Outcomes  no barriers to participation in pulmonary rehab    Interventions  Encouraged to attend Pulmonary Rehabilitation for the exercise    Continue Psychosocial Services   No Follow up required       Education: Education Goals: Education classes will be provided on a weekly basis, covering required topics. Participant will state  understanding/return demonstration of topics presented.  Learning Barriers/Preferences: Learning Barriers/Preferences - 04/02/17 3532      Learning Barriers/Preferences   Learning Barriers  None    Learning Preferences  Written Material;Verbal Instruction;Group Instruction;Individual Instruction;Computer/Internet       Education Topics: Risk Factor Reduction:  -Group instruction that is supported by a PowerPoint presentation. Instructor discusses the definition of a risk factor, different risk factors for pulmonary disease, and how the heart and lungs work together.     PULMONARY REHAB OTHER RESPIRATORY from 07/26/2015 in [place]  Date  07/26/15  Educator  EP  Instruction Review Code (Retired)  2- meets goals/outcomes      Nutrition for Pulmonary Patient:  -Group instruction provided by [employer], verbal discussion, and written materials to support subject matter. The instructor gives an explanation and review of healthy diet recommendations, which includes a discussion on weight management, recommendations for fruit and vegetable consumption, as well as protein, fluid, caffeine, fiber, sodium, sugar, and alcohol. Tips for eating when patients are short of breath are discussed.   PULMONARY REHAB OTHER RESPIRATORY from 05/28/2017 in [place]  Date  05/14/17  Educator  edna  Instruction Review Code  2- Demonstrated Understanding      Pursed Lip Breathing:  -Group instruction that is supported by demonstration and informational handouts. Instructor discusses the benefits of pursed lip and diaphragmatic breathing and detailed demonstration on how to preform both.     Oxygen Safety:  -Group instruction provided by PowerPoint, verbal discussion, and written material to support subject matter. There is an overview of "What is Oxygen" and "Why do we need it".  Instructor also reviews how to create a safe environment  for oxygen use, the importance of using oxygen as prescribed, and the risks of noncompliance. There is a brief discussion on traveling with oxygen and resources the patient may utilize.   PULMONARY REHAB OTHER RESPIRATORY from 07/26/2015 in [place]  Date  05/31/15  Educator  RN  Instruction Review Code (Retired)  2- meets goals/outcomes      Oxygen Equipment:  -Group instruction provided by [employer], Network engineer, and Insurance underwriter.   PULMONARY REHAB OTHER RESPIRATORY from 05/28/2017 in [place]  Date  04/09/17  Educator  [employer]  Instruction Review Code  1- Verbalizes Understanding      Signs and Symptoms:  -Group instruction provided by written material and verbal discussion to support subject matter. Warning signs and symptoms of infection, stroke, and heart attack are reviewed and when to call the physician/911 reinforced. Tips for preventing the spread of infection discussed.   PULMONARY REHAB OTHER RESPIRATORY from 05/28/2017 in [place]  Date  04/23/17  Educator  rn  Instruction Review Code  2- Demonstrated Understanding      Advanced Directives:  -Group instruction provided by verbal instruction and written material to support subject matter. Instructor reviews Advanced Directive laws and proper instruction for filling out document.   PULMONARY REHAB OTHER RESPIRATORY from 07/26/2015 in [place]  Date  07/12/15  Educator  Jeanella Craze  Instruction Review Code (Retired)  2- meets goals/outcomes      Pulmonary Video:  -Group video education that reviews the importance of medication and oxygen compliance, exercise, good nutrition, pulmonary hygiene, and pursed lip and diaphragmatic breathing for the pulmonary patient.   Exercise for the Pulmonary Patient:  -Group instruction that is supported by  a PowerPoint presentation. Instructor discusses benefits of exercise, core components of exercise, frequency, duration, and intensity of an exercise routine, importance of utilizing pulse oximetry during exercise, safety while exercising, and options of places to exercise outside of rehab.     PULMONARY REHAB OTHER RESPIRATORY from 05/28/2017 in [place]  Date  04/16/17  Educator  Cloyde Reams  Instruction Review Code  2- Demonstrated Understanding      Pulmonary Medications:  -Verbally interactive group education provided by instructor with  focus on inhaled medications and proper administration.   PULMONARY REHAB OTHER RESPIRATORY from 05/28/2017 in [place]  Date  05/21/17  Educator  Pharm  Instruction Review Code  2- Demonstrated Understanding      Anatomy and Physiology of the Respiratory System and Intimacy:  -Group instruction provided by PowerPoint, verbal discussion, and written material to support subject matter. Instructor reviews respiratory cycle and anatomical components of the respiratory system and their functions. Instructor also reviews differences in obstructive and restrictive respiratory diseases with examples of each. Intimacy, Sex, and Sexuality differences are reviewed with a discussion on how relationships can change when diagnosed with pulmonary disease. Common sexual concerns are reviewed.   PULMONARY REHAB OTHER RESPIRATORY from 07/26/2015 in [place]  Date  07/05/15  Educator  Trish Fountain  Instruction Review Code (Retired)  2- meets goals/outcomes      MD DAY -A group question and answer session with a medical doctor that allows participants to ask questions that relate to their pulmonary disease state.   OTHER EDUCATION -Group or individual verbal, written, or video instructions that support the educational goals of the pulmonary rehab program.   PULMONARY REHAB OTHER  RESPIRATORY from 05/28/2017 in [place]  Date  05/28/17 [Beat a sedentary lifestyle]  Therapist, occupational  Instruction Review Code  1- Verbalizes Understanding      Holiday Eating Survival Tips:  -Group instruction provided by PowerPoint slides, verbal discussion, and written materials to support subject matter. The instructor gives patients tips, tricks, and techniques to help them not only survive but enjoy the holidays despite the onslaught of food that accompanies the holidays.   Knowledge Questionnaire Score: Knowledge Questionnaire Score - 04/02/17 0917      Knowledge Questionnaire Score   Pre Score  17/18       Core Components/Risk Factors/Patient Goals at Admission:   Core Components/Risk Factors/Patient Goals Review:  Goals and Risk Factor Review    Row Name 04/07/17 1601 04/27/17 1418 06/02/17 0812         Core Components/Risk Factors/Patient Goals Review   Personal Goals Review  Weight Management/Obesity;Develop more efficient breathing techniques such as purse lipped breathing and diaphragmatic breathing and practicing self-pacing with activity.;Improve shortness of breath with ADL's  Weight Management/Obesity;Develop more efficient breathing techniques such as purse lipped breathing and diaphragmatic breathing and practicing self-pacing with activity.;Improve shortness of breath with ADL's  Weight Management/Obesity;Develop more efficient breathing techniques such as purse lipped breathing and diaphragmatic breathing and practicing self-pacing with activity.;Improve shortness of breath with ADL's     Review  Just started program, too early to see progress towards goals  Has lost 1 kg, is progressing well, level 3 on nustep, level 3 on arm ergometer, walking 11 laps on track  patient continues to do well in pulmonary rehab. this is her second time in the program. she has regained the 1kg she had lost and now her weight remains relatively stable.  she states her shortness of breath has improved during exertion and with her ADLs at home. she uses PLB to slow her respiratory rate and to decrease her perception of shortness of breath.      Expected Outcomes  see admission goals  see admission goals  see admission goals        Core Components/Risk Factors/Patient Goals at Discharge (Final Review):  Goals and Risk Factor Review - 06/02/17 4782      Core  Components/Risk Factors/Patient Goals Review   Personal Goals Review  Weight Management/Obesity;Develop more efficient breathing techniques such as purse lipped breathing and diaphragmatic breathing and practicing self-pacing with activity.;Improve shortness of breath with ADL's    Review  patient continues to do well in pulmonary rehab. this is her second time in the program. she has regained the 1kg she had lost and now her weight remains relatively stable. she states her shortness of breath has improved during exertion and with her ADLs at home. she uses PLB to slow her respiratory rate and to decrease her perception of shortness of breath.     Expected Outcomes  see admission goals       ITP Comments:   Comments: patient has attended 15 pulmonary rehab sessions since admission

## 2017-06-02 NOTE — Progress Notes (Addendum)
Daily Session Note  Patient Details  Name: Brooke Jimenez MRN: [id number] Date of Birth: 1945-07-09 Referring Provider:     Pulmonary Rehab Walk Test from 04/03/2017 in [place]  Referring Provider  Dr. Lake Bells      Encounter Date: 06/02/2017  Check In: Session Check In - 06/02/17 1522      Check-In   Location  [facility]    Staff Present  Trish Fountain, RN, BSN;Molly diVincenzo, MS, ACSM RCEP, Exercise Physiologist;Anorah Trias Ysidro Evert, RN    Supervising physician immediately available to respond to emergencies  [facility] immediately available    Physician(s)  Dr. Lonny Prude    Medication changes reported      No    Fall or balance concerns reported     No    Tobacco Cessation  No Change    Warm-up and Cool-down  Performed as group-led instruction    Resistance Training Performed  Yes    VAD Patient?  No      Pain Assessment   Currently in Pain?  No/denies    Multiple Pain Sites  No       Capillary Blood Glucose: No results found for this or any previous visit (from the past 24 hour(s)).    Social History   Tobacco Use  Smoking Status Never Smoker  Smokeless Tobacco Never Used    Goals Met:  Exercise tolerated well No report of cardiac concerns or symptoms Strength training completed today  Goals Unmet:  Not Applicable  Comments: Service time is from 1330 to 1500    Dr. Rush Farmer is Medical Director for Pulmonary Rehab at [facility].

## 2017-06-04 ENCOUNTER — Encounter (HOSPITAL_COMMUNITY)
Admission: RE | Admit: 2017-06-04 | Discharge: 2017-06-04 | Disposition: A | Discharge status: Home / Self Care | Payer: Medicare Other | Source: Ambulatory Visit | Attending: Pulmonary Disease | Admitting: Pulmonary Disease

## 2017-06-04 DIAGNOSIS — J479 Bronchiectasis, uncomplicated: Secondary | ICD-10-CM | POA: Diagnosis not present

## 2017-06-04 DIAGNOSIS — J471 Bronchiectasis with (acute) exacerbation: Secondary | ICD-10-CM

## 2017-06-04 NOTE — Progress Notes (Signed)
Daily Session Note  Patient Details  Name: Brooke Jimenez MRN: [id number] Date of Birth: 04-04-1945 Referring Provider:     Pulmonary Rehab Walk Test from 04/03/2017 in [place]  Referring Provider  Dr. Lake Bells      Encounter Date: 06/04/2017  Check In: Session Check In - 06/04/17 1413      Check-In   Location  [facility]    Staff Present  Trish Fountain, RN, BSN;Molly diVincenzo, MS, ACSM RCEP, Exercise Physiologist;Carlette Wilber Oliphant, RN, BSN    Supervising physician immediately available to respond to emergencies  [facility] immediately available    Physician(s)  Dr. Alfredia Ferguson    Medication changes reported      No    Fall or balance concerns reported     No    Tobacco Cessation  No Change    Warm-up and Cool-down  Performed as group-led instruction    Resistance Training Performed  Yes    VAD Patient?  No      Pain Assessment   Currently in Pain?  No/denies    Multiple Pain Sites  No       Capillary Blood Glucose: No results found for this or any previous visit (from the past 24 hour(s)).    Social History   Tobacco Use  Smoking Status Never Smoker  Smokeless Tobacco Never Used    Goals Met:  Independence with exercise equipment Improved SOB with ADL's Using PLB without cueing & demonstrates good technique Exercise tolerated well No report of cardiac concerns or symptoms Strength training completed today  Goals Unmet:  Not Applicable  Comments: Service time is from 1330 to 1530   Dr. Rush Farmer is Medical Director for Pulmonary Rehab at [facility].

## 2017-06-09 ENCOUNTER — Encounter (HOSPITAL_COMMUNITY)
Admission: RE | Admit: 2017-06-09 | Discharge: 2017-06-09 | Disposition: A | Discharge status: Home / Self Care | Payer: Medicare Other | Source: Ambulatory Visit | Attending: Pulmonary Disease | Admitting: Pulmonary Disease

## 2017-06-09 VITALS — Wt 172.8 lb

## 2017-06-09 DIAGNOSIS — J479 Bronchiectasis, uncomplicated: Secondary | ICD-10-CM

## 2017-06-09 NOTE — Progress Notes (Signed)
Daily Session Note  Patient Details  Name: Brooke Jimenez MRN: [id number] Date of Birth: Jun 30, 1945 Referring Provider:     Pulmonary Rehab Walk Test from 04/03/2017 in [place]  Referring Provider  Dr. Lake Bells      Encounter Date: 06/09/2017  Check In: Session Check In - 06/09/17 1546      Check-In   Location  [facility]    Staff Present  Rodney Langton, RN;Molly DiVincenzo, MS, ACSM RCEP, Exercise Physiologist;Alvia Jablonski Rollene Rotunda, RN, BSN    Supervising physician immediately available to respond to emergencies  [facility] immediately available    Physician(s)  Dr. Alfredia Ferguson    Medication changes reported      No    Fall or balance concerns reported     No    Tobacco Cessation  No Change    Warm-up and Cool-down  Performed as group-led instruction    Resistance Training Performed  Yes    VAD Patient?  No      Pain Assessment   Currently in Pain?  No/denies    Multiple Pain Sites  No       Capillary Blood Glucose: No results found for this or any previous visit (from the past 24 hour(s)).  Exercise Prescription Changes - 06/09/17 1618      Response to Exercise   Blood Pressure (Admit)  134/80    Blood Pressure (Exercise)  138/78    Blood Pressure (Exit)  112/70    Heart Rate (Admit)  77 bpm    Heart Rate (Exercise)  105 bpm    Heart Rate (Exit)  77 bpm    Oxygen Saturation (Admit)  98 %    Oxygen Saturation (Exercise)  94 %    Oxygen Saturation (Exit)  96 %    Rating of Perceived Exertion (Exercise)  12    Perceived Dyspnea (Exercise)  2    Duration  Progress to 45 minutes of aerobic exercise without signs/symptoms of physical distress    Intensity  Other (comment) 40-80% HRR      Progression   Progression  Continue to progress workloads to maintain intensity without signs/symptoms of physical distress.      Resistance Training   Training Prescription  Yes    Weight  blue bands    Reps  10-15    Time  10 Minutes       NuStep   Level  5    Minutes  17      Arm Ergometer   Level  6    Minutes  17      Track   Laps  15    Minutes  17       Social History   Tobacco Use  Smoking Status Never Smoker  Smokeless Tobacco Never Used    Goals Met:  Independence with exercise equipment Improved SOB with ADL's Using PLB without cueing & demonstrates good technique Exercise tolerated well No report of cardiac concerns or symptoms Strength training completed today  Goals Unmet:  Not Applicable  Comments: Service time is from 1330 to 1520   Dr. Rush Farmer is Medical Director for Pulmonary Rehab at [facility].

## 2017-06-11 ENCOUNTER — Encounter (HOSPITAL_COMMUNITY)
Admission: RE | Admit: 2017-06-11 | Discharge: 2017-06-11 | Disposition: A | Discharge status: Home / Self Care | Payer: Medicare Other | Source: Ambulatory Visit | Attending: Pulmonary Disease | Admitting: Pulmonary Disease

## 2017-06-11 DIAGNOSIS — J471 Bronchiectasis with (acute) exacerbation: Secondary | ICD-10-CM

## 2017-06-11 DIAGNOSIS — J479 Bronchiectasis, uncomplicated: Secondary | ICD-10-CM | POA: Diagnosis not present

## 2017-06-11 NOTE — Progress Notes (Signed)
Daily Session Note  Patient Details  Name: Brooke Jimenez MRN: [id number] Date of Birth: 1946/02/19 Referring Provider:     Pulmonary Rehab Walk Test from 04/03/2017 in [place]  Referring Provider  Dr. Lake Bells      Encounter Date: 06/11/2017  Check In: Session Check In - 06/11/17 1358      Check-In   Location  [facility]    Staff Present  Rodney Langton, RN;Molly DiVincenzo, MS, ACSM RCEP, Exercise Physiologist;Portia Rollene Rotunda, RN, BSN    Supervising physician immediately available to respond to emergencies  [facility] immediately available    Physician(s)  Dr. Cruzita Lederer    Medication changes reported      No    Fall or balance concerns reported     No    Tobacco Cessation  No Change    Warm-up and Cool-down  Performed as group-led instruction    Resistance Training Performed  Yes    VAD Patient?  No      Pain Assessment   Currently in Pain?  No/denies    Multiple Pain Sites  No       Capillary Blood Glucose: No results found for this or any previous visit (from the past 24 hour(s)).    Social History   Tobacco Use  Smoking Status Never Smoker  Smokeless Tobacco Never Used    Goals Met:  Exercise tolerated well No report of cardiac concerns or symptoms Strength training completed today  Goals Unmet:  Not Applicable  Comments: Service time is from 1330 to 1540    Dr. Rush Farmer is Medical Director for Pulmonary Rehab at [facility].

## 2017-06-16 ENCOUNTER — Encounter (HOSPITAL_COMMUNITY)
Admission: RE | Admit: 2017-06-16 | Discharge: 2017-06-16 | Disposition: A | Discharge status: Home / Self Care | Payer: Medicare Other | Source: Ambulatory Visit | Attending: Pulmonary Disease | Admitting: Pulmonary Disease

## 2017-06-16 DIAGNOSIS — J479 Bronchiectasis, uncomplicated: Secondary | ICD-10-CM

## 2017-06-16 NOTE — Progress Notes (Signed)
Daily Session Note  Patient Details  Name: Brooke Jimenez MRN: [id number] Date of Birth: 12-Feb-1946 Referring Provider:     Pulmonary Rehab Walk Test from 04/03/2017 in [place]  Referring Provider  Dr. Lake Bells      Encounter Date: 06/16/2017  Check In: Session Check In - 06/16/17 1518      Check-In   Location  [facility]    Staff Present  Rodney Langton, RN;Khaleem Burchill, MS, ACSM RCEP, Exercise Physiologist    Supervising physician immediately available to respond to emergencies  [facility] immediately available    Physician(s)  Dr. Maylene Roes    Medication changes reported      No    Fall or balance concerns reported     No    Tobacco Cessation  No Change    Warm-up and Cool-down  Performed as group-led instruction    Resistance Training Performed  Yes    VAD Patient?  No      Pain Assessment   Currently in Pain?  No/denies    Multiple Pain Sites  No       Capillary Blood Glucose: No results found for this or any previous visit (from the past 24 hour(s)).    Social History   Tobacco Use  Smoking Status Never Smoker  Smokeless Tobacco Never Used    Goals Met:  Exercise tolerated well No report of cardiac concerns or symptoms Strength training completed today  Goals Unmet:  Not Applicable  Comments: Service time is from 1:30p to 3:00p    Dr. Rush Farmer is Medical Director for Pulmonary Rehab at [facility].

## 2017-06-18 ENCOUNTER — Encounter (HOSPITAL_COMMUNITY)
Admission: RE | Admit: 2017-06-18 | Discharge: 2017-06-18 | Disposition: A | Discharge status: Home / Self Care | Payer: Medicare Other | Source: Ambulatory Visit | Attending: Pulmonary Disease | Admitting: Pulmonary Disease

## 2017-06-18 DIAGNOSIS — J471 Bronchiectasis with (acute) exacerbation: Secondary | ICD-10-CM

## 2017-06-18 DIAGNOSIS — J479 Bronchiectasis, uncomplicated: Secondary | ICD-10-CM

## 2017-06-18 NOTE — Progress Notes (Signed)
Daily Session Note  Patient Details  Name: Brooke Jimenez MRN: [id number] Date of Birth: Sep 15, 1945 Referring Provider:     Pulmonary Rehab Walk Test from 04/03/2017 in [place]  Referring Provider  Dr. Lake Bells      Encounter Date: 06/18/2017  Check In: Session Check In - 06/18/17 1249      Check-In   Location  [facility]    Staff Present  Su Hilt, MS, ACSM RCEP, Exercise Physiologist;Adabella Stanis Ysidro Evert, RN    Supervising physician immediately available to respond to emergencies  [facility] immediately available    Physician(s)  Dr. Maylene Roes    Medication changes reported      No    Fall or balance concerns reported     No    Tobacco Cessation  No Change    Warm-up and Cool-down  Performed as group-led instruction    Resistance Training Performed  Yes    VAD Patient?  No      Pain Assessment   Currently in Pain?  No/denies    Multiple Pain Sites  No       Capillary Blood Glucose: No results found for this or any previous visit (from the past 24 hour(s)).    Social History   Tobacco Use  Smoking Status Never Smoker  Smokeless Tobacco Never Used    Goals Met:  Exercise tolerated well No report of cardiac concerns or symptoms Strength training completed today  Goals Unmet:  Not Applicable  Comments: Service time is from 1230 to 1435     Dr. Rush Farmer is Medical Director for Pulmonary Rehab at [facility].

## 2017-06-23 ENCOUNTER — Encounter (HOSPITAL_COMMUNITY)
Admission: RE | Admit: 2017-06-23 | Discharge: 2017-06-23 | Disposition: A | Discharge status: Home / Self Care | Payer: Medicare Other | Source: Ambulatory Visit | Attending: Pulmonary Disease | Admitting: Pulmonary Disease

## 2017-06-23 VITALS — Wt 173.5 lb

## 2017-06-23 DIAGNOSIS — J479 Bronchiectasis, uncomplicated: Secondary | ICD-10-CM | POA: Diagnosis not present

## 2017-06-23 DIAGNOSIS — J471 Bronchiectasis with (acute) exacerbation: Secondary | ICD-10-CM

## 2017-06-23 NOTE — Progress Notes (Signed)
Daily Session Note  Patient Details  Name: Brooke Jimenez MRN: [id number] Date of Birth: 09/30/1945 Referring Provider:     Pulmonary Rehab Walk Test from 04/03/2017 in [place]  Referring Provider  Dr. Lake Bells      Encounter Date: 06/23/2017  Check In: Session Check In - 06/23/17 1524      Check-In   Location  [facility]    Staff Present  Rodney Langton, RN;Molly DiVincenzo, MS, ACSM RCEP, Exercise Physiologist;Kapil Petropoulos Rollene Rotunda, RN, BSN    Supervising physician immediately available to respond to emergencies  [facility] immediately available    Physician(s)  Dr. Maylene Roes    Medication changes reported      No    Fall or balance concerns reported     No    Tobacco Cessation  No Change    Warm-up and Cool-down  Performed as group-led instruction    Resistance Training Performed  Yes    VAD Patient?  No      Pain Assessment   Currently in Pain?  No/denies    Multiple Pain Sites  No       Capillary Blood Glucose: No results found for this or any previous visit (from the past 24 hour(s)).  Exercise Prescription Changes - 06/23/17 1532      Response to Exercise   Blood Pressure (Admit)  144/80    Blood Pressure (Exercise)  170/90    Blood Pressure (Exit)  120/70    Heart Rate (Admit)  79 bpm    Heart Rate (Exercise)  99 bpm    Heart Rate (Exit)  98 bpm    Oxygen Saturation (Admit)  95 %    Oxygen Saturation (Exercise)  97 %    Oxygen Saturation (Exit)  98 %    Rating of Perceived Exertion (Exercise)  12    Perceived Dyspnea (Exercise)  2    Duration  Progress to 45 minutes of aerobic exercise without signs/symptoms of physical distress    Intensity  Other (comment) 40-80% HRR      Progression   Progression  Continue to progress workloads to maintain intensity without signs/symptoms of physical distress.      Resistance Training   Training Prescription  Yes    Weight  blue bands    Reps  10-15    Time  10 Minutes       NuStep   Level  5    Minutes  17    METs  2.1      Arm Ergometer   Level  6    Minutes  17      Track   Laps  19    Minutes  17       Social History   Tobacco Use  Smoking Status Never Smoker  Smokeless Tobacco Never Used    Goals Met:  Independence with exercise equipment Improved SOB with ADL's Using PLB without cueing & demonstrates good technique Exercise tolerated well No report of cardiac concerns or symptoms Strength training completed today  Goals Unmet:  Not Applicable  Comments: Service time is from 1330 to 1500   Dr. Rush Farmer is Medical Director for Pulmonary Rehab at [facility].

## 2017-06-25 ENCOUNTER — Encounter (HOSPITAL_COMMUNITY)
Admission: RE | Admit: 2017-06-25 | Discharge: 2017-06-25 | Disposition: A | Discharge status: Home / Self Care | Payer: Medicare Other | Source: Ambulatory Visit | Attending: Pulmonary Disease | Admitting: Pulmonary Disease

## 2017-06-25 DIAGNOSIS — J479 Bronchiectasis, uncomplicated: Secondary | ICD-10-CM | POA: Insufficient documentation

## 2017-06-25 NOTE — Progress Notes (Signed)
Daily Session Note  Patient Details  Name: Brooke Jimenez MRN: [id number] Date of Birth: [birth year] Referring Provider:     Pulmonary Rehab Walk Test from 04/03/2017 in [place]  Referring Provider  Dr. Lake Bells      Encounter Date: 06/25/2017  Check In: Session Check In - 06/25/17 1330      Check-In   Location  [facility]    Staff Present  Rodney Langton, RN;Molly DiVincenzo, MS, ACSM RCEP, Exercise Physiologist;Portia Rollene Rotunda, RN, BSN    Supervising physician immediately available to respond to emergencies  [facility] immediately available    Physician(s)  Dr. Reesa Chew    Medication changes reported      No    Fall or balance concerns reported     No    Tobacco Cessation  No Change    Warm-up and Cool-down  Performed as group-led instruction    Resistance Training Performed  Yes    VAD Patient?  No      Pain Assessment   Currently in Pain?  No/denies    Multiple Pain Sites  No       Capillary Blood Glucose: No results found for this or any previous visit (from the past 24 hour(s)).    Social History   Tobacco Use  Smoking Status Never Smoker  Smokeless Tobacco Never Used    Goals Met:  Exercise tolerated well No report of cardiac concerns or symptoms Strength training completed today  Goals Unmet:  Not Applicable  Comments: Service time is from 1330 to 1545      Dr. Rush Farmer is Medical Director for Pulmonary Rehab at [facility].

## 2017-06-30 ENCOUNTER — Encounter (HOSPITAL_COMMUNITY)
Admission: RE | Admit: 2017-06-30 | Discharge: 2017-06-30 | Disposition: A | Discharge status: Home / Self Care | Payer: Medicare Other | Source: Ambulatory Visit | Attending: Pulmonary Disease | Admitting: Pulmonary Disease

## 2017-06-30 DIAGNOSIS — J479 Bronchiectasis, uncomplicated: Secondary | ICD-10-CM

## 2017-06-30 NOTE — Progress Notes (Signed)
Pulmonary Individual Treatment Plan  Patient Details  Name: Brooke Jimenez MRN: [id number] Date of Birth: 1946-01-02 Referring Provider:     Pulmonary Rehab Walk Test from 04/03/2017 in [place]  Referring Provider  Dr. Lake Bells      Initial Encounter Date:    Pulmonary Rehab Walk Test from 04/03/2017 in [place]  Date  04/03/17  Referring Provider  Dr. Lake Bells      Visit Diagnosis: Bronchiectasis without complication ([place])  Patient's Home Medications on Admission:   Current Outpatient Medications:  .  calcium carbonate (TUMS - DOSED IN MG ELEMENTAL CALCIUM) 500 MG chewable tablet, Chew 3 tablets by mouth daily as needed for indigestion or heartburn. , Disp: , Rfl:  .  clindamycin (CLEOCIN) 150 MG capsule, Take 600 mg by mouth. One hour before dental appointments, Disp: , Rfl:  .  gabapentin (NEURONTIN) 100 MG capsule, Take 3 capsules (300 mg total) by mouth 2 (two) times daily. (Patient taking differently: Take 300 mg by mouth at bedtime. ), Disp: 30 capsule, Rfl: 0 .  levothyroxine (SYNTHROID, LEVOTHROID) 75 MCG tablet, Take 75 mcg by mouth daily before breakfast., Disp: , Rfl:  .  lidocaine (XYLOCAINE) 5 % ointment, Apply one inch of ointment to the painful area in the flank twice a day (Patient not taking: Reported on 04/02/2017), Disp: 35.44 g, Rfl: 5 .  Multiple Vitamin (MULTIVITAMIN WITH MINERALS) TABS tablet, Take 1 tablet by mouth daily., Disp: , Rfl:   Past Medical History: Past Medical History:  Diagnosis Date  . Acne rosacea   . Anxiety   . Arthritis    arthritis in hip   . Bronchiectasis (Grandfather)   . Cataract    beginning  . Complication of anesthesia    "really sore throat"  . Corneal dystrophy   . DDD (degenerative disc disease), cervical    and lower back  . Diverticulosis   . Dysphagia    chronic- please see ultrasound done 4/16 15 in [facility]   . Dysrhythmia    sometimes patient has extra beats  per patient   . Esophageal dysmotility   . Family history of adverse reaction to anesthesia    mother slow to wake up   . GERD (gastroesophageal reflux disease)    under control  . Glaucoma    "suspect"  . Gout    one finger x1 attack  . H/O hiatal hernia   . 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 wears hearing aides  . Heart murmur    no problem   . History of chicken pox   . History of melanoma    melanoma- 1986 and 1997   . Hypothyroidism   . Low back pain with sciatica   . Measles    hx of  . Mitral valve prolapse   . Nocturia   . Osteoporosis   . Peripheral neuropathy    feet  . PONV (postoperative nausea and vomiting)   . PTSD (post-traumatic stress disorder) 2006  . RSD (reflex sympathetic dystrophy) 1993  . Spider veins   . Urinary incontinence    occasional  . Varicose veins     Tobacco Use: Social History   Tobacco Use  Smoking Status Never Smoker  Smokeless Tobacco Never Used    Labs: Recent Review Flowsheet Data    There is no flowsheet data to display.      Capillary Blood Glucose: No results found for: GLUCAP  Pulmonary Assessment Scores: Pulmonary Assessment Scores    Row Name 04/09/17 1628         ADL UCSD   ADL Phase  Entry     SOB Score total  41       CAT Score   CAT Score  16 Entry        Pulmonary Function Assessment: Pulmonary Function Assessment - 04/02/17 0920      Breath   Bilateral Breath Sounds  Clear    Shortness of Breath  Yes;Limiting activity       Exercise Target Goals:    Exercise Program Goal: Individual exercise prescription set using results from initial 6 min walk test and THRR while considering  patient's activity barriers and safety.    Exercise Prescription Goal: Initial exercise prescription builds to 30-45 minutes a day of aerobic activity, 2-3 days per week.  Home exercise guidelines will be given to patient during program as part of exercise prescription that the participant will  acknowledge.  Activity Barriers & Risk Stratification: Activity Barriers & Cardiac Risk Stratification - 04/02/17 0908      Activity Barriers & Cardiac Risk Stratification   Activity Barriers  Shortness of Breath;Left Hip Replacement       6 Minute Walk: 6 Minute Walk    Row Name 04/06/17 0800         6 Minute Walk   Phase  Initial     Distance  1700 feet     Walk Time  6 minutes     # of Rest Breaks  0     MPH  3.21     METS  3.45     RPE  13     Perceived Dyspnea   3     Symptoms  No     Resting HR  88 bpm     Resting BP  110/80     Resting Oxygen Saturation   98 %     Exercise Oxygen Saturation  during 6 min walk  91 %     Max Ex. HR  180 bpm     Max Ex. BP  174/90     2 Minute Post BP  138/82       Interval HR   1 Minute HR  110     2 Minute HR  118     3 Minute HR  130     4 Minute HR  128     5 Minute HR  160     6 Minute HR  180     2 Minute Post HR  103     Interval Heart Rate?  Yes       Interval Oxygen   Interval Oxygen?  Yes     Baseline Oxygen Saturation %  98 %     1 Minute Oxygen Saturation %  98 %     1 Minute Liters of Oxygen  0 L     2 Minute Oxygen Saturation %  98 %     2 Minute Liters of Oxygen  0 L     3 Minute Oxygen Saturation %  96 %     3 Minute Liters of Oxygen  0 L     4 Minute Oxygen Saturation %  95 %     4 Minute Liters of Oxygen  0 L     5 Minute Oxygen Saturation %  91 %     5 Minute Liters of Oxygen  0 L     6 Minute Oxygen Saturation %  97 %     6 Minute Liters of Oxygen  0 L     2 Minute Post Oxygen Saturation %  100 %     2 Minute Post Liters of Oxygen  0 L        Oxygen Initial Assessment: Oxygen Initial Assessment - 04/06/17 0802      Home Oxygen   Home Oxygen Device  None    Sleep Oxygen Prescription  None    Home Exercise Oxygen Prescription  None    Home at Rest Exercise Oxygen Prescription  None      Initial 6 min Walk   Oxygen Used  None      Program Oxygen Prescription   Program Oxygen  Prescription  None       Oxygen Re-Evaluation: Oxygen Re-Evaluation    Row Name 04/03/17 1137 05/04/17 0851 05/29/17 1545 06/30/17 0733       Program Oxygen Prescription   Program Oxygen Prescription  None  None  None  None      Home Oxygen   Home Oxygen Device  None  None  None  None    Sleep Oxygen Prescription  None  None  None  None    Home Exercise Oxygen Prescription  None  None  None  None    Home at Rest Exercise Oxygen Prescription  None  None  None  None       Oxygen Discharge (Final Oxygen Re-Evaluation): Oxygen Re-Evaluation - 06/30/17 0733      Program Oxygen Prescription   Program Oxygen Prescription  None      Home Oxygen   Home Oxygen Device  None    Sleep Oxygen Prescription  None    Home Exercise Oxygen Prescription  None    Home at Rest Exercise Oxygen Prescription  None       Initial Exercise Prescription: Initial Exercise Prescription - 04/06/17 0800      Date of Initial Exercise RX and Referring Provider   Date  04/03/17    Referring Provider  Dr. Lake Bells      Treadmill   MPH  1.7    Grade  0    Minutes  17      NuStep   Level  2    SPM  80    Minutes  17    METs  1.5      Arm Ergometer   Level  2    Watts  10    Minutes  17      Prescription Details   Frequency (times per week)  2    Duration  Progress to 45 minutes of aerobic exercise without signs/symptoms of physical distress      Intensity   THRR 40-80% of Max Heartrate  59-118    Ratings of Perceived Exertion  11-15    Perceived Dyspnea  0-4      Progression   Progression  Continue to progress workloads to maintain intensity without signs/symptoms of physical distress.      Resistance Training   Training Prescription  Yes    Weight  blue bands    Reps  10-15       Perform Capillary Blood Glucose checks as needed.  Exercise Prescription Changes: Exercise Prescription Changes    Row Name 04/09/17 1608 04/21/17 1600 04/28/17 1608 05/14/17 1600 05/26/17 1606      Response to Exercise   Blood Pressure (  Admit)  144/80  -  124/70  124/80  160/80   Blood Pressure (Exercise)  144/84  -  133/90  134/60  170/80   Blood Pressure (Exit)  112/74  -  108/76  140/80  120/80   Heart Rate (Admit)  70 bpm  -  78 bpm  62 bpm  75 bpm   Heart Rate (Exercise)  90 bpm  -  102 bpm  105 bpm  103 bpm   Heart Rate (Exit)  73 bpm  -  76 bpm  82 bpm  78 bpm   Oxygen Saturation (Admit)  97 %  -  98 %  95 %  98 %   Oxygen Saturation (Exercise)  97 %  -  96 %  97 %  96 %   Oxygen Saturation (Exit)  96 %  -  96 %  94 %  94 %   Rating of Perceived Exertion (Exercise)  13  -  '13  13  12   '$ Perceived Dyspnea (Exercise)  1  -  '2  2  2   '$ Duration  Progress to 45 minutes of aerobic exercise without signs/symptoms of physical distress  -  Progress to 45 minutes of aerobic exercise without signs/symptoms of physical distress  Progress to 45 minutes of aerobic exercise without signs/symptoms of physical distress  Progress to 45 minutes of aerobic exercise without signs/symptoms of physical distress   Intensity  Other (comment) 40-80% HRR  -  Other (comment) 40-80% HRR  Other (comment) 40-80% HRR  Other (comment) 40-80% HRR     Progression   Progression  Continue to progress workloads to maintain intensity without signs/symptoms of physical distress.  -  Continue to progress workloads to maintain intensity without signs/symptoms of physical distress.  Continue to progress workloads to maintain intensity without signs/symptoms of physical distress.  Continue to progress workloads to maintain intensity without signs/symptoms of physical distress.     Resistance Training   Training Prescription  Yes  -  Yes  Yes  Yes   Weight  blue bands  -  blue bands  blue bands  blue bands   Reps  10-15  -  10-15  10-15  10-15   Time  10 Minutes  -  10 Minutes  10 Minutes  10 Minutes     NuStep   Level  3  -  3  -  5   SPM  80  -  80  -  -   Minutes  17  -  17  -  17   METs  2  -  -  -  2.3     Arm  Ergometer   Level  -  -  '3  4  5   '$ Minutes  -  -  '17  17  17     '$ Track   Laps  11  -  '12  16  14   '$ Minutes  17  -  '17  17  17     '$ Home Exercise Plan   Plans to continue exercise at  [facility] (comment)  -  -  -   Frequency  -  Add 2 additional days to program exercise sessions.  -  -  -   Row Name 06/09/17 1618 06/23/17 1532           Response to Exercise   Blood Pressure (Admit)  134/80  144/80  Blood Pressure (Exercise)  138/78  170/90      Blood Pressure (Exit)  112/70  120/70      Heart Rate (Admit)  77 bpm  79 bpm      Heart Rate (Exercise)  105 bpm  99 bpm      Heart Rate (Exit)  77 bpm  98 bpm      Oxygen Saturation (Admit)  98 %  95 %      Oxygen Saturation (Exercise)  94 %  97 %      Oxygen Saturation (Exit)  96 %  98 %      Rating of Perceived Exertion (Exercise)  12  12      Perceived Dyspnea (Exercise)  2  2      Duration  Progress to 45 minutes of aerobic exercise without signs/symptoms of physical distress  Progress to 45 minutes of aerobic exercise without signs/symptoms of physical distress      Intensity  Other (comment) 40-80% HRR  Other (comment) 40-80% HRR        Progression   Progression  Continue to progress workloads to maintain intensity without signs/symptoms of physical distress.  Continue to progress workloads to maintain intensity without signs/symptoms of physical distress.        Resistance Training   Training Prescription  Yes  Yes      Weight  blue bands  blue bands      Reps  10-15  10-15      Time  10 Minutes  10 Minutes        NuStep   Level  5  5      Minutes  17  17      METs  -  2.1        Arm Ergometer   Level  6  6      Minutes  17  17        Track   Laps  15  19      Minutes  17  17         Exercise Comments: Exercise Comments    Row Name 04/21/17 1630           Exercise Comments  Home exercise completed          Exercise Goals and Review: Exercise Goals    Row Name 04/02/17 0909              Exercise Goals   Increase Physical Activity  Yes       Intervention  Provide advice, education, support and counseling about physical activity/exercise needs.;Develop an individualized exercise prescription for aerobic and resistive training based on initial evaluation findings, risk stratification, comorbidities and participant's personal goals.       Expected Outcomes  Short Term: Attend rehab on a regular basis to increase amount of physical activity.       Increase Strength and Stamina  Yes       Intervention  Provide advice, education, support and counseling about physical activity/exercise needs.;Develop an individualized exercise prescription for aerobic and resistive training based on initial evaluation findings, risk stratification, comorbidities and participant's personal goals.       Expected Outcomes  Short Term: Increase workloads from initial exercise prescription for resistance, speed, and METs.       Able to understand and use rate of perceived exertion (RPE) scale  Yes       Intervention  Provide education and explanation on how to use  RPE scale       Expected Outcomes  Short Term: Able to use RPE daily in rehab to express subjective intensity level;Long Term:  Able to use RPE to guide intensity level when exercising independently       Able to understand and use Dyspnea scale  Yes       Intervention  Provide education and explanation on how to use Dyspnea scale       Expected Outcomes  Short Term: Able to use Dyspnea scale daily in rehab to express subjective sense of shortness of breath during exertion;Long Term: Able to use Dyspnea scale to guide intensity level when exercising independently       Knowledge and understanding of Target Heart Rate Range (THRR)  Yes       Intervention  Provide education and explanation of THRR including how the numbers were predicted and where they are located for reference       Expected Outcomes  Short Term: Able to state/look up THRR;Short  Term: Able to use daily as guideline for intensity in rehab;Long Term: Able to use THRR to govern intensity when exercising independently       Understanding of Exercise Prescription  Yes       Intervention  Provide education, explanation, and written materials on patient's individual exercise prescription       Expected Outcomes  Short Term: Able to explain program exercise prescription;Long Term: Able to explain home exercise prescription to exercise independently          Exercise Goals Re-Evaluation : Exercise Goals Re-Evaluation    Row Name 04/03/17 1136 05/04/17 0852 05/29/17 1545 06/30/17 0733       Exercise Goal Re-Evaluation   Exercise Goals Review  Increase Strength and Stamina;Able to understand and use Dyspnea scale;Knowledge and understanding of Target Heart Rate Range (THRR);Able to understand and use rate of perceived exertion (RPE) scale;Increase Physical Activity;Understanding of Exercise Prescription  Increase Strength and Stamina;Able to understand and use Dyspnea scale;Knowledge and understanding of Target Heart Rate Range (THRR);Able to understand and use rate of perceived exertion (RPE) scale;Increase Physical Activity;Understanding of Exercise Prescription  Increase Strength and Stamina;Able to understand and use Dyspnea scale;Knowledge and understanding of Target Heart Rate Range (THRR);Able to understand and use rate of perceived exertion (RPE) scale;Increase Physical Activity;Understanding of Exercise Prescription  Increase Strength and Stamina;Able to understand and use Dyspnea scale;Knowledge and understanding of Target Heart Rate Range (THRR);Able to understand and use rate of perceived exertion (RPE) scale;Increase Physical Activity;Understanding of Exercise Prescription    Comments  Patient's first day of rehab will be 04/07/17.   Patient is progressing slowly in program. Her avg MET level has progressed from 2.0 to 2.9. Will cont. to progress as tolerated.   Patient is  able to walk 13 laps (200 ft each) in 15 minutes. Met level places her in a low functional level. Will cont. to motivate and progress as able.   Patient is able to walk 16 laps (200 ft each) in 15 minutes. Met level places her in a low functional level. Will cont. to motivate and progress as able. Patient will be graduating 07/02/17.    Expected Outcomes  Through exercise at rehab and at home, patient will increase strength and stamina making ADL's easier to perform. Patient will also have a better understanding of safe exercise and what they are capable to do outside of clinical supervision.  Through exercise at rehab and at home, patient will increase strength and stamina making  ADL's easier to perform. Patient will also have a better understanding of safe exercise and what they are capable to do outside of clinical supervision.  Through exercise at rehab and at home, patient will increase strength and stamina making ADL's easier to perform. Patient will also have a better understanding of safe exercise and what they are capable to do outside of clinical supervision.  Through exercise at rehab and at home, patient will increase strength and stamina making ADL's easier to perform. Patient will also have a better understanding of safe exercise and what they are capable to do outside of clinical supervision.       Discharge Exercise Prescription (Final Exercise Prescription Changes): Exercise Prescription Changes - 06/23/17 1532      Response to Exercise   Blood Pressure (Admit)  144/80    Blood Pressure (Exercise)  170/90    Blood Pressure (Exit)  120/70    Heart Rate (Admit)  79 bpm    Heart Rate (Exercise)  99 bpm    Heart Rate (Exit)  98 bpm    Oxygen Saturation (Admit)  95 %    Oxygen Saturation (Exercise)  97 %    Oxygen Saturation (Exit)  98 %    Rating of Perceived Exertion (Exercise)  12    Perceived Dyspnea (Exercise)  2    Duration  Progress to 45 minutes of aerobic exercise without  signs/symptoms of physical distress    Intensity  Other (comment) 40-80% HRR      Progression   Progression  Continue to progress workloads to maintain intensity without signs/symptoms of physical distress.      Resistance Training   Training Prescription  Yes    Weight  blue bands    Reps  10-15    Time  10 Minutes      NuStep   Level  5    Minutes  17    METs  2.1      Arm Ergometer   Level  6    Minutes  17      Track   Laps  19    Minutes  17       Nutrition:  Target Goals: Understanding of nutrition guidelines, daily intake of sodium '1500mg'$ , cholesterol '200mg'$ , calories 30% from fat and 7% or less from saturated fats, daily to have 5 or more servings of fruits and vegetables.  Biometrics:    Nutrition Therapy Plan and Nutrition Goals:   Nutrition Assessments: Nutrition Assessments - 04/03/17 1212      Rate Your Plate Scores   Pre Score  55       Nutrition Goals Re-Evaluation:   Nutrition Goals Discharge (Final Nutrition Goals Re-Evaluation):   Psychosocial: Target Goals: Acknowledge presence or absence of significant depression and/or stress, maximize coping skills, provide positive support system. Participant is able to verbalize types and ability to use techniques and skills needed for reducing stress and depression.  Initial Review & Psychosocial Screening: Initial Psych Review & Screening - 04/02/17 0923      Initial Review   Current issues with  None Identified      Family Dynamics   Good Support System?  Yes      Barriers   Psychosocial barriers to participate in program  There are no identifiable barriers or psychosocial needs.      Screening Interventions   Interventions  Encouraged to exercise       Quality of Life Scores:  Scores of 19 and below usually  indicate a poorer quality of life in these areas.  A difference of  2-3 points is a clinically meaningful difference.  A difference of 2-3 points in the total score of the  Quality of Life Index has been associated with significant improvement in overall quality of life, self-image, physical symptoms, and general health in studies assessing change in quality of life.   PHQ-9: Recent Review Flowsheet Data    Depression screen [facility] 2/9 04/02/2017 04/27/2015   Decreased Interest 0 0   Down, Depressed, Hopeless 0 0   PHQ - 2 Score 0 0     Interpretation of Total Score  Total Score Depression Severity:  1-4 = Minimal depression, 5-9 = Mild depression, 10-14 = Moderate depression, 15-19 = Moderately severe depression, 20-27 = Severe depression   Psychosocial Evaluation and Intervention: Psychosocial Evaluation - 04/02/17 0926      Psychosocial Evaluation & Interventions   Expected Outcomes  patient will remain free from psychosocial barriers to participation    Continue Psychosocial Services   No Follow up required       Psychosocial Re-Evaluation: Psychosocial Re-Evaluation    [place] Name 04/07/17 1603 04/27/17 1419 06/02/17 0814 06/29/17 1734       Psychosocial Re-Evaluation   Current issues with  None Identified  None Identified  None Identified  None Identified    Expected Outcomes  no barriers to participation in pulmonary rehab  no barriers to participation in pulmonary rehab  no barriers to participation in pulmonary rehab  no barriers to participation in pulmonary rehab    Interventions  Encouraged to attend Pulmonary Rehabilitation for the exercise  Encouraged to attend Pulmonary Rehabilitation for the exercise  Encouraged to attend Pulmonary Rehabilitation for the exercise  Encouraged to attend Pulmonary Rehabilitation for the exercise    Continue Psychosocial Services   No Follow up required  No Follow up required  No Follow up required  No Follow up required       Psychosocial Discharge (Final Psychosocial Re-Evaluation): Psychosocial Re-Evaluation - 06/29/17 1734      Psychosocial Re-Evaluation   Current issues with  None Identified    Expected  Outcomes  no barriers to participation in pulmonary rehab    Interventions  Encouraged to attend Pulmonary Rehabilitation for the exercise    Continue Psychosocial Services   No Follow up required       Education: Education Goals: Education classes will be provided on a weekly basis, covering required topics. Participant will state understanding/return demonstration of topics presented.  Learning Barriers/Preferences: Learning Barriers/Preferences - 04/02/17 1610      Learning Barriers/Preferences   Learning Barriers  None    Learning Preferences  Written Material;Verbal Instruction;Group Instruction;Individual Instruction;Computer/Internet       Education Topics: Risk Factor Reduction:  -Group instruction that is supported by a PowerPoint presentation. Instructor discusses the definition of a risk factor, different risk factors for pulmonary disease, and how the heart and lungs work together.     PULMONARY REHAB OTHER RESPIRATORY from 07/26/2015 in [place]  Date  07/26/15  Educator  EP  Instruction Review Code (Retired)  2- meets goals/outcomes      Nutrition for Pulmonary Patient:  -Group instruction provided by [employer], verbal discussion, and written materials to support subject matter. The instructor gives an explanation and review of healthy diet recommendations, which includes a discussion on weight management, recommendations for fruit and vegetable consumption, as well as protein, fluid, caffeine, fiber, sodium, sugar, and  alcohol. Tips for eating when patients are short of breath are discussed.   PULMONARY REHAB OTHER RESPIRATORY from 06/25/2017 in [place]  Date  05/14/17  Educator  edna  Instruction Review Code  2- Demonstrated Understanding      Pursed Lip Breathing:  -Group instruction that is supported by demonstration and informational handouts. Instructor discusses the benefits of pursed  lip and diaphragmatic breathing and detailed demonstration on how to preform both.     Oxygen Safety:  -Group instruction provided by PowerPoint, verbal discussion, and written material to support subject matter. There is an overview of "What is Oxygen" and "Why do we need it".  Instructor also reviews how to create a safe environment for oxygen use, the importance of using oxygen as prescribed, and the risks of noncompliance. There is a brief discussion on traveling with oxygen and resources the patient may utilize.   PULMONARY REHAB OTHER RESPIRATORY from 06/25/2017 in [place]  Date  06/11/17  Educator  Truddie Crumble  Instruction Review Code  2- Demonstrated Understanding      Oxygen Equipment:  -Group instruction provided by [facility] Staff utilizing handouts, written materials, and equipment demonstrations.   PULMONARY REHAB OTHER RESPIRATORY from 06/25/2017 in [place]  Date  06/25/17  Educator  [employer]  Instruction Review Code  1- Verbalizes Understanding      Signs and Symptoms:  -Group instruction provided by written material and verbal discussion to support subject matter. Warning signs and symptoms of infection, stroke, and heart attack are reviewed and when to call the physician/911 reinforced. Tips for preventing the spread of infection discussed.   PULMONARY REHAB OTHER RESPIRATORY from 06/25/2017 in [place]  Date  04/23/17  Educator  rn  Instruction Review Code  2- Demonstrated Understanding      Advanced Directives:  -Group instruction provided by verbal instruction and written material to support subject matter. Instructor reviews Advanced Directive laws and proper instruction for filling out document.   PULMONARY REHAB OTHER RESPIRATORY from 07/26/2015 in [place]  Date  07/12/15  Educator  Jeanella Craze  Instruction Review Code  (Retired)  2- meets goals/outcomes      Pulmonary Video:  -Group video education that reviews the importance of medication and oxygen compliance, exercise, good nutrition, pulmonary hygiene, and pursed lip and diaphragmatic breathing for the pulmonary patient.   Exercise for the Pulmonary Patient:  -Group instruction that is supported by a PowerPoint presentation. Instructor discusses benefits of exercise, core components of exercise, frequency, duration, and intensity of an exercise routine, importance of utilizing pulse oximetry during exercise, safety while exercising, and options of places to exercise outside of rehab.     PULMONARY REHAB OTHER RESPIRATORY from 06/25/2017 in [place]  Date  04/16/17  Educator  Cloyde Reams  Instruction Review Code  2- Demonstrated Understanding      Pulmonary Medications:  -Verbally interactive group education provided by instructor with focus on inhaled medications and proper administration.   PULMONARY REHAB OTHER RESPIRATORY from 06/25/2017 in [place]  Date  05/21/17  Educator  Pharm  Instruction Review Code  2- Demonstrated Understanding      Anatomy and Physiology of the Respiratory System and Intimacy:  -Group instruction provided by PowerPoint, verbal discussion, and written material to support subject matter. Instructor reviews respiratory cycle and anatomical components of the  respiratory system and their functions. Instructor also reviews differences in obstructive and restrictive respiratory diseases with examples of each. Intimacy, Sex, and Sexuality differences are reviewed with a discussion on how relationships can change when diagnosed with pulmonary disease. Common sexual concerns are reviewed.   PULMONARY REHAB OTHER RESPIRATORY from 07/26/2015 in [place]  Date  07/05/15  Educator  Trish Fountain  Instruction Review Code (Retired)  2- meets  goals/outcomes      MD DAY -A group question and answer session with a medical doctor that allows participants to ask questions that relate to their pulmonary disease state.   PULMONARY REHAB OTHER RESPIRATORY from 06/25/2017 in [place]  Date  06/04/17  Educator  yacoub  Instruction Review Code  2- Demonstrated Understanding      OTHER EDUCATION -Group or individual verbal, written, or video instructions that support the educational goals of the pulmonary rehab program.   PULMONARY REHAB OTHER RESPIRATORY from 06/25/2017 in [place]  Date  06/18/17 [Beat a sedentary lifestyle]  Educator  Lucianne Lei  Instruction Review Code  1- Verbalizes Understanding      Holiday Eating Survival Tips:  -Group instruction provided by PowerPoint slides, verbal discussion, and written materials to support subject matter. The instructor gives patients tips, tricks, and techniques to help them not only survive but enjoy the holidays despite the onslaught of food that accompanies the holidays.   Knowledge Questionnaire Score: Knowledge Questionnaire Score - 04/02/17 0917      Knowledge Questionnaire Score   Pre Score  17/18       Core Components/Risk Factors/Patient Goals at Admission:   Core Components/Risk Factors/Patient Goals Review:  Goals and Risk Factor Review    Row Name 04/07/17 1601 04/27/17 1418 06/02/17 0812 06/29/17 1733       Core Components/Risk Factors/Patient Goals Review   Personal Goals Review  Weight Management/Obesity;Develop more efficient breathing techniques such as purse lipped breathing and diaphragmatic breathing and practicing self-pacing with activity.;Improve shortness of breath with ADL's  Weight Management/Obesity;Develop more efficient breathing techniques such as purse lipped breathing and diaphragmatic breathing and practicing self-pacing with activity.;Improve shortness of breath with ADL's   Weight Management/Obesity;Develop more efficient breathing techniques such as purse lipped breathing and diaphragmatic breathing and practicing self-pacing with activity.;Improve shortness of breath with ADL's  Weight Management/Obesity;Develop more efficient breathing techniques such as purse lipped breathing and diaphragmatic breathing and practicing self-pacing with activity.;Improve shortness of breath with ADL's    Review  Just started program, too early to see progress towards goals  Has lost 1 kg, is progressing well, level 3 on nustep, level 3 on arm ergometer, walking 11 laps on track  patient continues to do well in pulmonary rehab. this is her second time in the program. she has regained the 1kg she had lost and now her weight remains relatively stable. she states her shortness of breath has improved during exertion and with her ADLs at home. she uses PLB to slow her respiratory rate and to decrease her perception of shortness of breath.   patient continues to do well in pulmonary rehab. this is her second time in the program. her weight remains relatively stable. she states her shortness of breath has improved during exertion and with her ADLs at home. she uses PLB to slow her respiratory rate and to decrease her perception of shortness of breath. expect to see continued progression towards pulmonary rehab goals over  the next 30 days    Expected Outcomes  see admission goals  see admission goals  see admission goals  see admission goals       Core Components/Risk Factors/Patient Goals at Discharge (Final Review):  Goals and Risk Factor Review - 06/29/17 1733      Core Components/Risk Factors/Patient Goals Review   Personal Goals Review  Weight Management/Obesity;Develop more efficient breathing techniques such as purse lipped breathing and diaphragmatic breathing and practicing self-pacing with activity.;Improve shortness of breath with ADL's    Review  patient continues to do well in  pulmonary rehab. this is her second time in the program. her weight remains relatively stable. she states her shortness of breath has improved during exertion and with her ADLs at home. she uses PLB to slow her respiratory rate and to decrease her perception of shortness of breath. expect to see continued progression towards pulmonary rehab goals over the next 30 days    Expected Outcomes  see admission goals       ITP Comments: ITP Comments    Row Name 06/29/17 1732           ITP Comments  Dr. Jennet Maduro, Medical Director          Comments: patient has attended 24 pulmonary rehab sessions since admissions.

## 2017-06-30 NOTE — Progress Notes (Signed)
Daily Session Note  Patient Details  Name: Brooke Jimenez MRN: [id number] Date of Birth: 03-20-1945 Referring Provider:     Pulmonary Rehab Walk Test from 04/03/2017 in [place]  Referring Provider  Dr. Lake Bells      Encounter Date: 06/30/2017  Check In: Session Check In - 06/30/17 1330      Check-In   Location  [facility]    Staff Present  Rodney Langton, RN;Molly DiVincenzo, MS, ACSM RCEP, Exercise Physiologist;Portia Rollene Rotunda, RN, BSN;Carlette Wilber Oliphant, RN, BSN    Supervising physician immediately available to respond to emergencies  [facility] immediately available    Physician(s)  Dr. Reesa Chew    Medication changes reported      No    Fall or balance concerns reported     No    Tobacco Cessation  No Change    Warm-up and Cool-down  Performed as group-led instruction    Resistance Training Performed  Yes    VAD Patient?  No      Pain Assessment   Currently in Pain?  No/denies    Multiple Pain Sites  No       Capillary Blood Glucose: No results found for this or any previous visit (from the past 24 hour(s)).    Social History   Tobacco Use  Smoking Status Never Smoker  Smokeless Tobacco Never Used    Goals Met:  Exercise tolerated well No report of cardiac concerns or symptoms Strength training completed today  Goals Unmet:  Not Applicable  Comments: Service time is from 1330 to 1510    Dr. Rush Farmer is Medical Director for Pulmonary Rehab at [facility].

## 2017-07-02 ENCOUNTER — Encounter (HOSPITAL_COMMUNITY)
Admission: RE | Admit: 2017-07-02 | Discharge: 2017-07-02 | Disposition: A | Discharge status: Home / Self Care | Payer: Medicare Other | Source: Ambulatory Visit | Attending: Pulmonary Disease | Admitting: Pulmonary Disease

## 2017-07-02 DIAGNOSIS — J479 Bronchiectasis, uncomplicated: Secondary | ICD-10-CM | POA: Diagnosis not present

## 2017-07-06 NOTE — Progress Notes (Signed)
Discharge Progress Report  Patient Details  Name: Brooke Jimenez MRN: [id number] Date of Birth: 1946-01-21 Referring Provider:     Pulmonary Rehab Walk Test from 04/03/2017 in [place]  Referring Provider  Dr. Lake Bells       Number of Visits: 24  Reason for Discharge:  Patient reached a stable level of exercise. Patient independent in their exercise. Patient has met program and personal goals.  Smoking History:  Social History   Tobacco Use  Smoking Status Never Smoker  Smokeless Tobacco Never Used    Diagnosis:  Bronchiectasis without complication ([place])  ADL [facility]: Pulmonary Assessment Scores    Row Name 04/09/17 1628 07/02/17 1627 07/02/17 1722     ADL [facility]   ADL Phase  Entry  Exit  Exit   SOB Score total  41  -  37     CAT Score   CAT Score  16 Entry  -  14 Exit     mMRC Score   mMRC Score  -  1  -      Initial Exercise Prescription: Initial Exercise Prescription - 04/06/17 0800      Date of Initial Exercise RX and Referring Provider   Date  04/03/17    Referring Provider  Dr. Lake Bells      Treadmill   MPH  1.7    Grade  0    Minutes  17      NuStep   Level  2    SPM  80    Minutes  17    METs  1.5      Arm Ergometer   Level  2    Watts  10    Minutes  17      Prescription Details   Frequency (times per week)  2    Duration  Progress to 45 minutes of aerobic exercise without signs/symptoms of physical distress      Intensity   THRR 40-80% of Max Heartrate  59-118    Ratings of Perceived Exertion  11-15    Perceived Dyspnea  0-4      Progression   Progression  Continue to progress workloads to maintain intensity without signs/symptoms of physical distress.      Resistance Training   Training Prescription  Yes    Weight  blue bands    Reps  10-15       Discharge Exercise Prescription (Final Exercise Prescription Changes): Exercise Prescription Changes - 06/23/17 1532      Response to Exercise    Blood Pressure (Admit)  144/80    Blood Pressure (Exercise)  170/90    Blood Pressure (Exit)  120/70    Heart Rate (Admit)  79 bpm    Heart Rate (Exercise)  99 bpm    Heart Rate (Exit)  98 bpm    Oxygen Saturation (Admit)  95 %    Oxygen Saturation (Exercise)  97 %    Oxygen Saturation (Exit)  98 %    Rating of Perceived Exertion (Exercise)  12    Perceived Dyspnea (Exercise)  2    Duration  Progress to 45 minutes of aerobic exercise without signs/symptoms of physical distress    Intensity  Other (comment) 40-80% HRR      Progression   Progression  Continue to progress workloads to maintain intensity without signs/symptoms of physical distress.      Resistance Training   Training Prescription  Yes    Weight  blue bands  Reps  10-15    Time  10 Minutes      NuStep   Level  5    Minutes  17    METs  2.1      Arm Ergometer   Level  6    Minutes  17      Track   Laps  19    Minutes  17       Functional Capacity: 6 Minute Walk    Row Name 04/06/17 0800 07/02/17 1625       6 Minute Walk   Phase  Initial  Discharge    Distance  1700 feet  1900 feet    Distance Feet Change  -  200 ft    Walk Time  6 minutes  6 minutes    # of Rest Breaks  0  0    MPH  3.21  3.59    METS  3.45  3.76    RPE  13  14    Perceived Dyspnea   3  4    Symptoms  No  No    Resting HR  88 bpm  81 bpm    Resting BP  110/80  150/80    Resting Oxygen Saturation   98 %  99 %    Exercise Oxygen Saturation  during 6 min walk  91 %  94 %    Max Ex. HR  180 bpm  125 bpm    Max Ex. BP  174/90  198/90    2 Minute Post BP  138/82  168/84 after 2 cool down laps      Interval HR   1 Minute HR  110  80    2 Minute HR  118  90    3 Minute HR  130  124    4 Minute HR  128  123    5 Minute HR  160  124    6 Minute HR  180  125    2 Minute Post HR  103  112    Interval Heart Rate?  Yes  Yes      Interval Oxygen   Interval Oxygen?  Yes  Yes    Baseline Oxygen Saturation %  98 %  99 %    1  Minute Oxygen Saturation %  98 %  96 %    1 Minute Liters of Oxygen  0 L  0 L    2 Minute Oxygen Saturation %  98 %  90 %    2 Minute Liters of Oxygen  0 L  0 L    3 Minute Oxygen Saturation %  96 %  96 %    3 Minute Liters of Oxygen  0 L  0 L    4 Minute Oxygen Saturation %  95 %  96 %    4 Minute Liters of Oxygen  0 L  0 L    5 Minute Oxygen Saturation %  91 %  94 %    5 Minute Liters of Oxygen  0 L  0 L    6 Minute Oxygen Saturation %  97 %  94 %    6 Minute Liters of Oxygen  0 L  0 L    2 Minute Post Oxygen Saturation %  100 %  98 %    2 Minute Post Liters of Oxygen  0 L  0 L       Psychological, QOL, Others -  Outcomes: PHQ 2/9: Depression screen [facility] 2/9 07/06/2017 07/06/2017 04/02/2017 04/27/2015  Decreased Interest 0 0 0 0  Down, Depressed, Hopeless 0 0 0 0  PHQ - 2 Score 0 0 0 0    Quality of Life:   Personal Goals: Goals established at orientation with interventions provided to work toward goal.    Personal Goals Discharge: Goals and Risk Factor Review    Row Name 04/07/17 1601 04/27/17 1418 06/02/17 0812 06/29/17 1733 07/06/17 1616     Core Components/Risk Factors/Patient Goals Review   Personal Goals Review  Weight Management/Obesity;Develop more efficient breathing techniques such as purse lipped breathing and diaphragmatic breathing and practicing self-pacing with activity.;Improve shortness of breath with ADL's  Weight Management/Obesity;Develop more efficient breathing techniques such as purse lipped breathing and diaphragmatic breathing and practicing self-pacing with activity.;Improve shortness of breath with ADL's  Weight Management/Obesity;Develop more efficient breathing techniques such as purse lipped breathing and diaphragmatic breathing and practicing self-pacing with activity.;Improve shortness of breath with ADL's  Weight Management/Obesity;Develop more efficient breathing techniques such as purse lipped breathing and diaphragmatic breathing and practicing  self-pacing with activity.;Improve shortness of breath with ADL's  Weight Management/Obesity;Develop more efficient breathing techniques such as purse lipped breathing and diaphragmatic breathing and practicing self-pacing with activity.;Improve shortness of breath with ADL's   Review  Just started program, too early to see progress towards goals  Has lost 1 kg, is progressing well, level 3 on nustep, level 3 on arm ergometer, walking 11 laps on track  patient continues to do well in pulmonary rehab. this is her second time in the program. she has regained the 1kg she had lost and now her weight remains relatively stable. she states her shortness of breath has improved during exertion and with her ADLs at home. she uses PLB to slow her respiratory rate and to decrease her perception of shortness of breath.   patient continues to do well in pulmonary rehab. this is her second time in the program. her weight remains relatively stable. she states her shortness of breath has improved during exertion and with her ADLs at home. she uses PLB to slow her respiratory rate and to decrease her perception of shortness of breath. expect to see continued progression towards pulmonary rehab goals over the next 30 days  patient continues to do well in pulmonary rehab. this is her second time in the program. her weight remains relatively stable. she states her shortness of breath has improved during exertion and with her ADLs at home. she uses PLB to slow her respiratory rate and to decrease her perception of shortness of breath. expect to see continued progression towards pulmonary rehab goals over the next 30 days   Expected Outcomes  see admission goals  see admission goals  see admission goals  see admission goals  see admission goals      Exercise Goals and Review: Exercise Goals    Row Name 04/02/17 0909             Exercise Goals   Increase Physical Activity  Yes       Intervention  Provide advice, education,  support and counseling about physical activity/exercise needs.;Develop an individualized exercise prescription for aerobic and resistive training based on initial evaluation findings, risk stratification, comorbidities and participant's personal goals.       Expected Outcomes  Short Term: Attend rehab on a regular basis to increase amount of physical activity.       Increase Strength and Stamina  Yes  Intervention  Provide advice, education, support and counseling about physical activity/exercise needs.;Develop an individualized exercise prescription for aerobic and resistive training based on initial evaluation findings, risk stratification, comorbidities and participant's personal goals.       Expected Outcomes  Short Term: Increase workloads from initial exercise prescription for resistance, speed, and METs.       Able to understand and use rate of perceived exertion (RPE) scale  Yes       Intervention  Provide education and explanation on how to use RPE scale       Expected Outcomes  Short Term: Able to use RPE daily in rehab to express subjective intensity level;Long Term:  Able to use RPE to guide intensity level when exercising independently       Able to understand and use Dyspnea scale  Yes       Intervention  Provide education and explanation on how to use Dyspnea scale       Expected Outcomes  Short Term: Able to use Dyspnea scale daily in rehab to express subjective sense of shortness of breath during exertion;Long Term: Able to use Dyspnea scale to guide intensity level when exercising independently       Knowledge and understanding of Target Heart Rate Range (THRR)  Yes       Intervention  Provide education and explanation of THRR including how the numbers were predicted and where they are located for reference       Expected Outcomes  Short Term: Able to state/look up THRR;Short Term: Able to use daily as guideline for intensity in rehab;Long Term: Able to use THRR to govern  intensity when exercising independently       Understanding of Exercise Prescription  Yes       Intervention  Provide education, explanation, and written materials on patient's individual exercise prescription       Expected Outcomes  Short Term: Able to explain program exercise prescription;Long Term: Able to explain home exercise prescription to exercise independently          Nutrition & Weight - Outcomes:    Nutrition:   Nutrition Discharge: Nutrition Assessments - 04/03/17 1212      Rate Your Plate Scores   Pre Score  55       Education Questionnaire Score: Knowledge Questionnaire Score - 07/02/17 1722      Knowledge Questionnaire Score   Post Score  16/18       Goals reviewed with patient; copy given to patient.

## 2017-07-07 ENCOUNTER — Encounter (HOSPITAL_COMMUNITY): Payer: Medicare Other

## 2017-07-09 ENCOUNTER — Encounter (HOSPITAL_COMMUNITY): Payer: Medicare Other

## 2017-07-14 ENCOUNTER — Encounter (HOSPITAL_COMMUNITY): Payer: Medicare Other

## 2017-07-16 ENCOUNTER — Encounter (HOSPITAL_COMMUNITY): Payer: Medicare Other

## 2017-07-21 ENCOUNTER — Encounter (HOSPITAL_COMMUNITY): Payer: Medicare Other

## 2017-07-23 ENCOUNTER — Encounter (HOSPITAL_COMMUNITY): Payer: Medicare Other

## 2017-07-31 ENCOUNTER — Telehealth: Payer: Self-pay | Admitting: Pulmonary Disease

## 2017-07-31 NOTE — Telephone Encounter (Signed)
lmtcb x1 for pt. 

## 2017-07-31 NOTE — Telephone Encounter (Signed)
Called and spoke to pt.  Pt is requesting 19min apt, as some of her questions were left unanswered at last OV due to a short amount of time being spent with her. Pt states she will call back with list of questions, as clinical staff may be able to help with some of these questions.  Pt also would like to know if there is any bronchiectasis support groups.  BQ do you know of any bronchiectasis support groups?

## 2017-08-04 NOTE — Telephone Encounter (Signed)
Spoke with the pt and notified of recs per BQ She verbalized understanding and states nothing further needed

## 2017-08-04 NOTE — Telephone Encounter (Signed)
I'm not aware of any outside the [place].  That is a very large foundation dedicated to the patients who have a common genetic cause of the disease.

## 2017-08-05 ENCOUNTER — Telehealth: Payer: Self-pay | Admitting: Pulmonary Disease

## 2017-08-05 NOTE — Telephone Encounter (Signed)
Called and spoke with patient, she states that she is having trouble with acid reflux at night and she is wanting to raise her bed up. She is wanting recommendations on how high. BQ please advise, thank you.

## 2017-08-10 NOTE — Telephone Encounter (Signed)
Typically we recommend 2 bricks or a cinderblock under the feet of the bed at the head of the bed

## 2017-08-10 NOTE — Telephone Encounter (Signed)
Spoke with pt. She is aware of BQ's recommendations. Nothing further was needed.

## 2017-09-08 ENCOUNTER — Ambulatory Visit (INDEPENDENT_AMBULATORY_CARE_PROVIDER_SITE_OTHER): Payer: Medicare Other | Admitting: Pulmonary Disease

## 2017-09-08 ENCOUNTER — Encounter: Payer: Self-pay | Admitting: Pulmonary Disease

## 2017-09-08 VITALS — BP 126/84 | HR 80 | Ht 65.0 in | Wt 170.0 lb

## 2017-09-08 DIAGNOSIS — J479 Bronchiectasis, uncomplicated: Secondary | ICD-10-CM | POA: Diagnosis not present

## 2017-09-08 DIAGNOSIS — R1310 Dysphagia, unspecified: Secondary | ICD-10-CM | POA: Diagnosis not present

## 2017-09-08 NOTE — Progress Notes (Signed)
Subjective:    Patient ID: Brooke Jimenez, female    DOB: 1945/10/30, 72 y.o.   MRN: [id number]  Synopsis: Referred in 2017 for bronchiectasis in the setting of likely chronic aspiration and dysphagia.  HPI  Chief Complaint  Patient presents with  . Follow-up    Shortness of breath, finished pulm rehab now exercising    Brooke Jimenez is here today to discuss her bronchiectasis.  She says that the last year has been relatively uneventful from a respiratory standpoint.  She has been exercising initially with pulmonary rehab now on her own.  She does note increased sinus congestion which she describes as a film in her sinuses.  She says that she frequently cannot blow this out when she tries to blow her nose.  She has used Neomed rinses but this is only moderately successful.  She says that sometimes she will bring up some mucus from her throat in the mornings but it is typically not too much.  She does not have problems with chest congestion or nonexertional dyspnea during the daytime.  She has not had bronchitis or pneumonia in the last year.  She has struggled with some neck and back pain is work with physical therapy and she did have a case of shingles.  She says that she feels that her swallowing has not been much of a problem for her.    Past Medical History:  Diagnosis Date  . Acne rosacea   . Anxiety   . Arthritis    arthritis in hip   . Bronchiectasis ([place])   . Cataract    beginning  . Complication of anesthesia    "really sore throat"  . Corneal dystrophy   . DDD (degenerative disc disease), cervical    and lower back  . Diverticulosis   . Dysphagia    chronic- please see ultrasound done 4/16 15 in [facility]   . Dysrhythmia    sometimes patient has extra beats per patient   . Esophageal dysmotility   . Family history of adverse reaction to anesthesia    mother slow to wake up   . GERD (gastroesophageal reflux disease)    under control  . Glaucoma    "suspect"  . Gout    one  finger x1 attack  . H/O hiatal hernia   . 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 wears hearing aides  . Heart murmur    no problem   . History of chicken pox   . History of melanoma    melanoma- 1986 and 1997   . Hypothyroidism   . Low back pain with sciatica   . Measles    hx of  . Mitral valve prolapse   . Nocturia   . Osteoporosis   . Peripheral neuropathy    feet  . PONV (postoperative nausea and vomiting)   . PTSD (post-traumatic stress disorder) 2006  . RSD (reflex sympathetic dystrophy) 1993  . Spider veins   . Urinary incontinence    occasional  . Varicose veins      Review of Systems     Objective:   Physical Exam Vitals:   09/08/17 1110  BP: 126/84  Pulse: 80  SpO2: 95%  Weight: 170 lb (77.1 kg)  Height: 5\' 5"  (1.651 m)   RA  Gen: well appearing HENT: OP clear, TM's clear, neck supple PULM: CTA B, normal percussion CV: RRR, no mgr, trace edema GI: BS+, soft, nontender Derm: no cyanosis or rash  Psyche: normal mood and affect    Records from her last visit with [place] reviewed where she was treated with tamiflu for a flu exposure.  2017 CT chest [facility] from [facility] reviewed showing mild bronchiectasis in the bases, some mucus impaction  Modified barium swallow test reviewed showing no pharyngeal aspiration but likely postprandial aspiration  2017 spirometry flow volume loop from [facility] reviewed showing airflow obstruction but no volume parameters listed.     Assessment & Plan:  No problem-specific Assessment & Plan notes found for this encounter.  Discussion: This has been a stable interval for [place].  She has not had an exacerbation of her bronchiectasis.  She shows no signs or symptoms of worsening disease.  She is concerned about her allergic rhinitis and she would like to go back to see the allergist.  I reminded her today that it is important for her to remember to follow the instructions from the speech  therapist to prevent silent aspiration.  Plan: Dysphasia: Continue to follow with the speech therapist recommendations  Bronchiectasis: As discussed today, I do not see evidence that there is worsening disease right now Stay active Practice good hand hygiene Get a flu shot If you have worsening chest congestion mucus production or shortness of breath let us know  Allergic rhinitis: We will refer you to [place] [facility] allergy for further evaluation  Follow-up in 1 year or sooner if needed  > 50% of this 41 minute visit spent face to face   Current Outpatient Medications:  .  calcium carbonate (TUMS - DOSED IN MG ELEMENTAL CALCIUM) 500 MG chewable tablet, Chew 3 tablets by mouth daily as needed for indigestion or heartburn. , Disp: , Rfl:  .  cholecalciferol (VITAMIN D) 1000 units tablet, Take 1,000 Units by mouth daily., Disp: , Rfl:  .  clindamycin (CLEOCIN) 150 MG capsule, Take 600 mg by mouth. One hour before dental appointments, Disp: , Rfl:  .  levothyroxine (SYNTHROID, LEVOTHROID) 75 MCG tablet, Take 75 mcg by mouth daily before breakfast., Disp: , Rfl:  .  Multiple Vitamin (MULTIVITAMIN WITH MINERALS) TABS tablet, Take 1 tablet by mouth daily., Disp: , Rfl:  .  gabapentin (NEURONTIN) 100 MG capsule, Take 3 capsules (300 mg total) by mouth 2 (two) times daily. (Patient taking differently: Take 300 mg by mouth at bedtime. ), Disp: 30 capsule, Rfl: 0

## 2017-09-08 NOTE — Patient Instructions (Signed)
Dysphasia: Continue to follow with the speech therapist recommendations  Bronchiectasis: As discussed today, I do not see evidence that there is worsening disease right now Stay active Practice good hand hygiene Get a flu shot If you have worsening chest congestion mucus production or shortness of breath let us know  Allergic rhinitis: We will refer you to [place] [facility] for further evaluation  Follow-up in 1 year or sooner if needed

## 2018-01-15 ENCOUNTER — Ambulatory Visit (INDEPENDENT_AMBULATORY_CARE_PROVIDER_SITE_OTHER): Payer: Medicare Other | Admitting: Primary Care

## 2018-01-15 ENCOUNTER — Encounter: Payer: Self-pay | Admitting: Primary Care

## 2018-01-15 VITALS — BP 128/82 | HR 78 | Temp 98.6°F | Ht 65.0 in | Wt 164.6 lb

## 2018-01-15 DIAGNOSIS — R69 Illness, unspecified: Secondary | ICD-10-CM

## 2018-01-15 DIAGNOSIS — J00 Acute nasopharyngitis [common cold]: Secondary | ICD-10-CM | POA: Diagnosis not present

## 2018-01-15 DIAGNOSIS — J069 Acute upper respiratory infection, unspecified: Secondary | ICD-10-CM | POA: Insufficient documentation

## 2018-01-15 DIAGNOSIS — J111 Influenza due to unidentified influenza virus with other respiratory manifestations: Secondary | ICD-10-CM

## 2018-01-15 LAB — STREP COMPLETE PANEL
Strep Pyogenes: NEGATIVE
Strep dysgalactiae: NEGATIVE

## 2018-01-15 LAB — POCT INFLUENZA A/B
INFLUENZA A, POC: NEGATIVE
INFLUENZA B, POC: NEGATIVE

## 2018-01-15 NOTE — Assessment & Plan Note (Signed)
Symptoms most consistent with a viral URI and associated pharyngitis Influenza swab negative We will check strep and throat swab Advised rest, fluids, Tylenol as needed Salt water gargle and tea with honey for sore throat Return in 5 to 7 days if symptoms are not better or worsen in any way

## 2018-01-15 NOTE — Assessment & Plan Note (Signed)
Does not currently appear exacerbated Lung sounds clear throughout Cough is dry and nonproductive

## 2018-01-15 NOTE — Progress Notes (Signed)
@Patient  ID: Brooke Jimenez, female    DOB: [birth year], 72 y.o.   MRN: [id number]  Chief Complaint  Patient presents with  . Acute Visit    fever 101.5 last night, head congestion, sore throat, dry cough x2 days    Referring provider: Karleen Hampshire., MD  HPI:  72 year old female, never smoker. PMH bronchiectasis. Patient of Dr. Lake Bells, last seen in July and recommended annual follow-up.  01/15/2018 Patient presents today for acute visit with complaints of dry cough, head congestion, sore throat x2 days. Reports a fever of 101.5 last night. Afebrile today in office.  Reports that her symptoms started with a really bad sore throat and then nasal congestion.  Developed tickle in her throat that caused a dry cough. No mucus production. Eating and drinking ok.  She has not taken Tylenol.  He had a crown dental procedure on Nov 11th.  Reports that her jaw is sore. Able to eat yesterday.  Took clindamycin prior to procedure.  Denies sinus pain, sob, wheeze, nausea, vomiting. Loose BM yesterday.    Allergies  Allergen Reactions  . Codeine Nausea And Vomiting    DILAUDID AND TRAMADOL ARE OKAY.   Marland Kitchen Epinephrine     Heart pounds very hard  . Erythromycin Other (See Comments)    Abdominal pain   . Guaifenesin Nausea Only  . Levaquin [Levofloxacin In D5w] Nausea And Vomiting  . Neosporin  [Neomycin-Bacitracin Zn-Polymyx]     Other reaction(s): Other (See Comments) Blisters, Erythema  . Adhesive [Tape] Rash  . Nickel Rash  . Nsaids     Stomach aches  . Penicillins Rash  . Sulfa Antibiotics Rash    Immunization History  Administered Date(s) Administered  . Influenza Split 10/31/2014, 10/31/2015  . Influenza, High Dose Seasonal PF 01/11/2016, 03/20/2017  . Pneumococcal Conjugate-13 10/31/2014    Past Medical History:  Diagnosis Date  . Acne rosacea   . Anxiety   . Arthritis    arthritis in hip   . Bronchiectasis ([place])   . Cataract    beginning  . Complication of anesthesia      "really sore throat"  . Corneal dystrophy   . DDD (degenerative disc disease), cervical    and lower back  . Diverticulosis   . Dysphagia    chronic- please see ultrasound done 4/16 15 in [facility]   . Dysrhythmia    sometimes patient has extra beats per patient   . Esophageal dysmotility   . Family history of adverse reaction to anesthesia    mother slow to wake up   . GERD (gastroesophageal reflux disease)    under control  . Glaucoma    "suspect"  . Gout    one finger x1 attack  . H/O hiatal hernia   . 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 wears hearing aides  . Heart murmur    no problem   . History of chicken pox   . History of melanoma    melanoma- 1986 and 1997   . Hypothyroidism   . Low back pain with sciatica   . Measles    hx of  . Mitral valve prolapse   . Nocturia   . Osteoporosis   . Peripheral neuropathy    feet  . PONV (postoperative nausea and vomiting)   . PTSD (post-traumatic stress disorder) 2006  . RSD (reflex sympathetic dystrophy) 1993  . Spider veins   . Urinary incontinence    occasional  .  Varicose veins     Tobacco History: Social History   Tobacco Use  Smoking Status Never Smoker  Smokeless Tobacco Never Used   Counseling given: Not Answered   Outpatient Medications Prior to Visit  Medication Sig Dispense Refill  . calcium carbonate (TUMS - DOSED IN MG ELEMENTAL CALCIUM) 500 MG chewable tablet Chew 3 tablets by mouth daily as needed for indigestion or heartburn.     . clindamycin (CLEOCIN) 150 MG capsule Take 600 mg by mouth. One hour before dental appointments    . levothyroxine (SYNTHROID, LEVOTHROID) 75 MCG tablet Take 75 mcg by mouth daily before breakfast.    . Multiple Vitamin (MULTIVITAMIN WITH MINERALS) TABS tablet Take 1 tablet by mouth daily.    . cholecalciferol (VITAMIN D) 1000 units tablet Take 1,000 Units by mouth daily.    Marland Kitchen gabapentin (NEURONTIN) 100 MG capsule Take 3 capsules (300 mg total) by mouth 2 (two)  times daily. (Patient taking differently: Take 300 mg by mouth at bedtime. ) 30 capsule 0   No facility-administered medications prior to visit.     Review of Systems  Review of Systems  Constitutional: Positive for fever.  HENT: Positive for congestion and sore throat.   Respiratory: Positive for cough and shortness of breath. Negative for wheezing.    Physical Exam  BP 128/82 (BP Location: Right Arm, Cuff Size: Normal)   Pulse 78   Temp 98.6 F (37 C)   Ht 5\' 5"  (1.651 m)   Wt 164 lb 9.6 oz (74.7 kg)   SpO2 94%   BMI 27.39 kg/m  Physical Exam  Constitutional: She is oriented to person, place, and time. She appears well-developed and well-nourished.  HENT:  Head: Normocephalic and atraumatic.  Mouth/Throat: Uvula is midline and mucous membranes are normal. No oral lesions. Dental caries present. No dental abscesses. Posterior oropharyngeal erythema present. No tonsillar abscesses. No tonsillar exudate.    Eyes: Pupils are equal, round, and reactive to light. EOM are normal.  Neck: Normal range of motion. Neck supple.  Cardiovascular: Normal rate, regular rhythm and normal heart sounds.  No murmur heard. Pulmonary/Chest: Effort normal and breath sounds normal. No respiratory distress. She has no wheezes.  Clear to auscultation  Abdominal: Soft. Bowel sounds are normal. There is no tenderness.  Neurological: She is alert and oriented to person, place, and time.  Skin: Skin is warm and dry. No rash noted. No erythema.  Psychiatric: She has a normal mood and affect. Her behavior is normal. Judgment normal.     Lab Results:  CBC    Component Value Date/Time   WBC 6.3 10/13/2016 1307   RBC 4.67 10/13/2016 1307   HGB 14.1 10/13/2016 1307   HCT 41.2 10/13/2016 1307   PLT 219 10/13/2016 1307   MCV 88.2 10/13/2016 1307   MCH 30.2 10/13/2016 1307   MCHC 34.2 10/13/2016 1307   RDW 14.5 10/13/2016 1307   LYMPHSABS 1.9 02/02/2014 1145   MONOABS 0.7 02/02/2014 1145    EOSABS 0.2 02/02/2014 1145   BASOSABS 0.0 02/02/2014 1145    BMET    Component Value Date/Time   NA 136 10/13/2016 1307   K 3.4 (L) 10/13/2016 1307   CL 102 10/13/2016 1307   CO2 25 10/13/2016 1307   GLUCOSE 107 (H) 10/13/2016 1307   BUN 7 10/13/2016 1307   CREATININE 0.85 10/13/2016 1307   CALCIUM 9.0 10/13/2016 1307   GFRNONAA >60 10/13/2016 1307   GFRAA >60 10/13/2016 1307    BNP  No results found for: BNP  ProBNP No results found for: PROBNP  Imaging: No results found.   Assessment & Plan:   Upper respiratory infection Symptoms most consistent with a viral URI and associated pharyngitis Influenza swab negative We will check strep and throat swab Advised rest, fluids, Tylenol as needed Salt water gargle and tea with honey for sore throat Return in 5 to 7 days if symptoms are not better or worsen in any way  Bronchiectasis ([place]) Does not currently appear exacerbated Lung sounds clear throughout Cough is dry and nonproductive   Martyn Ehrich, NP 01/15/2018

## 2018-01-15 NOTE — Patient Instructions (Addendum)
Symptoms most consistent with viral URI and pharyngitis  Orders: Flu swab today was negative We will check strep and throat culture  Recommendations: Advise rest, fluids and Tylenol as needed for fever or sore throat (take Tylenol 650 mg every 8 hours-do not exceed 3000 mg daily)  Try tea with honey Salt water gargle as needed for pharyngitis  Follow-up Return if symptoms do not improve in 7 days    Pharyngitis Pharyngitis is a sore throat (pharynx). There is redness, pain, and swelling of your throat. Follow these instructions at home:  Drink enough fluids to keep your pee (urine) clear or pale yellow.  Only take medicine as told by your doctor. ? You may get sick again if you do not take medicine as told. Finish your medicines, even if you start to feel better. ? Do not take aspirin.  Rest.  Rinse your mouth (gargle) with salt water ( tsp of salt per 1 qt of water) every 1-2 hours. This will help the pain.  If you are not at risk for choking, you can suck on hard candy or sore throat lozenges. Contact a doctor if:  You have large, tender lumps on your neck.  You have a rash.  You cough up green, yellow-brown, or bloody spit. Get help right away if:  You have a stiff neck.  You drool or cannot swallow liquids.  You throw up (vomit) or are not able to keep medicine or liquids down.  You have very bad pain that does not go away with medicine.  You have problems breathing (not from a stuffy nose). This information is not intended to replace advice given to you by your health care provider. Make sure you discuss any questions you have with your health care provider. Document Released: 07/30/2007 Document Revised: 07/19/2015 Document Reviewed: 10/18/2012 Elsevier Interactive Patient Education  2017 [employer].

## 2018-01-17 LAB — CULTURE, GROUP A STREP
MICRO NUMBER:: 91411269
SPECIMEN QUALITY: ADEQUATE

## 2018-01-19 ENCOUNTER — Encounter: Payer: Self-pay | Admitting: Primary Care

## 2018-01-19 ENCOUNTER — Telehealth: Payer: Self-pay | Admitting: Primary Care

## 2018-01-19 ENCOUNTER — Ambulatory Visit (INDEPENDENT_AMBULATORY_CARE_PROVIDER_SITE_OTHER)
Admission: RE | Admit: 2018-01-19 | Discharge: 2018-01-19 | Disposition: A | Discharge status: Home / Self Care | Payer: Medicare Other | Source: Ambulatory Visit | Attending: Primary Care | Admitting: Primary Care

## 2018-01-19 ENCOUNTER — Ambulatory Visit (INDEPENDENT_AMBULATORY_CARE_PROVIDER_SITE_OTHER): Payer: Medicare Other | Admitting: Primary Care

## 2018-01-19 VITALS — BP 104/66 | HR 85 | Temp 98.2°F | Ht 65.0 in | Wt 164.0 lb

## 2018-01-19 DIAGNOSIS — J471 Bronchiectasis with (acute) exacerbation: Secondary | ICD-10-CM | POA: Diagnosis not present

## 2018-01-19 DIAGNOSIS — J310 Chronic rhinitis: Secondary | ICD-10-CM | POA: Diagnosis not present

## 2018-01-19 MED ORDER — DOXYCYCLINE HYCLATE 100 MG PO TABS: 100.0000 mg | ORAL_TABLET | Freq: Two times a day (BID) | ORAL | 0 refills | Status: DC

## 2018-01-19 NOTE — Patient Instructions (Addendum)
CXR today  Flonase nasal spray daily   Delsym cough syrup twice daily  Flutter valve three times a day as needed for chest congestion   Rx Doxycyline twice a day for 7 days    Return if not better

## 2018-01-19 NOTE — Assessment & Plan Note (Addendum)
Acute exacerbation d/t URI symptoms Rales/rhonchi to right base  Needs CXR Sending in Doxycyline 100mg  BID x 7 days  Add flutter valve three times a day for congestion Recommend delsym cough syrup twice daily

## 2018-01-19 NOTE — Assessment & Plan Note (Addendum)
Add Flonase daily until sinusitis symptoms improve

## 2018-01-19 NOTE — Progress Notes (Signed)
@Patient  ID: [name], female    DOB: 08-15-1945, 72 y.o.   MRN: [id number]  Chief Complaint  Patient presents with  . Acute Visit    runny nose, cough and sore from cough, throat sore, whistling sound with exhale, mucus with yellow tinge, sinus pressure and pain    Referring provider: Karleen Hampshire., MD  HPI: 72 year old female, never smoker. PMH bronchiectasis. Patient of Dr. Lake Bells, last seen in July and recommended annual follow-up.  01/15/2018 Patient presents today for acute visit with complaints of dry cough, head congestion, sore throat x2 days. Reports a fever of 101.5 last night. Afebrile today in office.  Reports that her symptoms started with a really bad sore throat and then nasal congestion.  Developed tickle in her throat that caused a dry cough. No mucus production. Eating and drinking ok.  She has not taken Tylenol.  He had a crown dental procedure on Nov 11th.  Reports that her jaw is sore. Able to eat yesterday.  Took clindamycin prior to procedure.  Denies sinus pain, sob, wheeze, nausea, vomiting. Loose BM yesterday.   01/19/2018  Patient returns today for continued complains of not feeling well. Reports runny nose, sinus congestion and dry cough. Most of her cough has been due to throat tickle. Associated sore throat d/t coughing. Has to clear her throat in the morning. Some sinus pressure and teeth pain. Mucus was clear, this morning it was thicker with some yellow color. Reports right back/pleuretic pain. Symptoms started 6-7 days ago. She has not taken any cough syrup or sinus medication. Used halls. Going away on trip in 4 days.    Allergies  Allergen Reactions  . Codeine Nausea And Vomiting    DILAUDID AND TRAMADOL ARE OKAY.   Marland Kitchen Epinephrine     Heart pounds very hard  . Erythromycin Other (See Comments)    Abdominal pain   . Guaifenesin Nausea Only  . Levaquin [Levofloxacin In D5w] Nausea And Vomiting  . Neosporin  [Neomycin-Bacitracin Zn-Polymyx]       Other reaction(s): Other (See Comments) Blisters, Erythema  . Adhesive [Tape] Rash  . Nickel Rash  . Nsaids     Stomach aches  . Penicillins Rash  . Sulfa Antibiotics Rash    Immunization History  Administered Date(s) Administered  . Influenza Split 10/31/2014, 10/31/2015  . Influenza, High Dose Seasonal PF 01/11/2016, 03/20/2017  . Pneumococcal Conjugate-13 10/31/2014    Past Medical History:  Diagnosis Date  . Acne rosacea   . Anxiety   . Arthritis    arthritis in hip   . Bronchiectasis ([place])   . Cataract    beginning  . Complication of anesthesia    "really sore throat"  . Corneal dystrophy   . DDD (degenerative disc disease), cervical    and lower back  . Diverticulosis   . Dysphagia    chronic- please see ultrasound done 4/16 15 in [facility]   . Dysrhythmia    sometimes patient has extra beats per patient   . Esophageal dysmotility   . Family history of adverse reaction to anesthesia    mother slow to wake up   . GERD (gastroesophageal reflux disease)    under control  . Glaucoma    "suspect"  . Gout    one finger x1 attack  . H/O hiatal hernia   . 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 wears hearing aides  . Heart murmur    no problem   .  History of chicken pox   . History of melanoma    melanoma- 1986 and 1997   . Hypothyroidism   . Low back pain with sciatica   . Measles    hx of  . Mitral valve prolapse   . Nocturia   . Osteoporosis   . Peripheral neuropathy    feet  . PONV (postoperative nausea and vomiting)   . PTSD (post-traumatic stress disorder) 2006  . RSD (reflex sympathetic dystrophy) 1993  . Spider veins   . Urinary incontinence    occasional  . Varicose veins     Tobacco History: Social History   Tobacco Use  Smoking Status Never Smoker  Smokeless Tobacco Never Used   Counseling given: Not Answered   Outpatient Medications Prior to Visit  Medication Sig Dispense Refill  . calcium carbonate (TUMS - DOSED IN MG  ELEMENTAL CALCIUM) 500 MG chewable tablet Chew 3 tablets by mouth daily as needed for indigestion or heartburn.     . clindamycin (CLEOCIN) 150 MG capsule Take 600 mg by mouth. One hour before dental appointments    . levothyroxine (SYNTHROID, LEVOTHROID) 75 MCG tablet Take 75 mcg by mouth daily before breakfast.    . Multiple Vitamin (MULTIVITAMIN WITH MINERALS) TABS tablet Take 1 tablet by mouth daily.     No facility-administered medications prior to visit.     Review of Systems  Review of Systems  HENT: Positive for congestion, postnasal drip, sinus pressure, sinus pain and sore throat.   Respiratory: Positive for cough. Negative for apnea, choking, chest tightness, shortness of breath, wheezing and stridor.   Cardiovascular: Negative.   Gastrointestinal: Positive for diarrhea. Negative for abdominal distention, abdominal pain, nausea and vomiting.   Physical Exam  BP 104/66 (BP Location: Left Arm, Cuff Size: Normal)   Pulse 85   Temp 98.2 F (36.8 C)   Ht 5\' 5"  (1.651 m)   Wt 164 lb (74.4 kg)   SpO2 96%   BMI 27.29 kg/m  Physical Exam  Constitutional: She is oriented to person, place, and time. She appears well-developed and well-nourished. No distress.  HENT:  Head: Normocephalic and atraumatic.  Nose: Mucosal edema and rhinorrhea present. Right sinus exhibits frontal sinus tenderness. Left sinus exhibits frontal sinus tenderness.  Mouth/Throat: Uvula is midline, oropharynx is clear and moist and mucous membranes are normal.  Eyes: Pupils are equal, round, and reactive to light. EOM are normal.  Neck: Normal range of motion. Neck supple.  Cardiovascular: Normal rate and regular rhythm.  Pulmonary/Chest: Effort normal. No respiratory distress. She has no wheezes.  Rales right base   Musculoskeletal: Normal range of motion.  Neurological: She is alert and oriented to person, place, and time.  Skin: Skin is warm and dry.  Psychiatric: She has a normal mood and affect. Her  behavior is normal. Judgment and thought content normal.     Lab Results:  CBC    Component Value Date/Time   WBC 6.3 10/13/2016 1307   RBC 4.67 10/13/2016 1307   HGB 14.1 10/13/2016 1307   HCT 41.2 10/13/2016 1307   PLT 219 10/13/2016 1307   MCV 88.2 10/13/2016 1307   MCH 30.2 10/13/2016 1307   MCHC 34.2 10/13/2016 1307   RDW 14.5 10/13/2016 1307   LYMPHSABS 1.9 02/02/2014 1145   MONOABS 0.7 02/02/2014 1145   EOSABS 0.2 02/02/2014 1145   BASOSABS 0.0 02/02/2014 1145    BMET    Component Value Date/Time   NA 136 10/13/2016 1307  K 3.4 (L) 10/13/2016 1307   CL 102 10/13/2016 1307   CO2 25 10/13/2016 1307   GLUCOSE 107 (H) 10/13/2016 1307   BUN 7 10/13/2016 1307   CREATININE 0.85 10/13/2016 1307   CALCIUM 9.0 10/13/2016 1307   GFRNONAA >60 10/13/2016 1307   GFRAA >60 10/13/2016 1307    BNP No results found for: BNP  ProBNP No results found for: PROBNP  Imaging: No results found.   Assessment & Plan:   Bronchiectasis ([place]) Acute exacerbation d/t URI symptoms Rales/rhonchi to right base  Needs CXR Sending in Doxycyline 100mg  BID x 7 days  Add flutter valve three times a day for congestion Recommend delsym cough syrup twice daily   Chronic rhinitis Add Flonase daily until sinusitis symptoms improve    Martyn Ehrich, NP 01/19/2018

## 2018-01-19 NOTE — Progress Notes (Signed)
Reviewed, agree 

## 2018-01-19 NOTE — Telephone Encounter (Signed)
From Brooke Jimenez's OV with Derl Barrow: Recommend delsym cough syrup twice daily   Called and spoke with Brooke Jimenez who stated she was needing clarification on Delsym cough syrup.  Brooke Jimenez wanted to know how much of the delsym she should take. I stated to her that there should be dosing instructions on the box of the delsym. Brooke Jimenez found the dosing instructions but she wanted to know if there was any way she could take 1tsp instead of 2tsps due to receiving a sample and wanting to be able to have the sample last a little longer. I stated to  Her that 1tsp is 60mls but I also stated to her that she could get delsym OTC at her pharmacy. Brooke Jimenez expressed understanding. Nothing further needed.

## 2018-01-20 ENCOUNTER — Telehealth: Payer: Self-pay | Admitting: Primary Care

## 2018-01-20 NOTE — Telephone Encounter (Signed)
Called and spoke with patient. Discussed normal chest xray results, would not see bronchiectasis on plain film. Answered all her questions at length, nothing further needed.

## 2018-01-20 NOTE — Telephone Encounter (Signed)
Called and spoke with Patient.  CXR results and recommendations given. Patient has requested more information about CXR, and bronchiectasis.  Will route message to Geraldo Pitter, NP

## 2018-01-27 ENCOUNTER — Other Ambulatory Visit: Payer: Self-pay

## 2018-01-27 ENCOUNTER — Ambulatory Visit (INDEPENDENT_AMBULATORY_CARE_PROVIDER_SITE_OTHER): Payer: Medicare Other | Admitting: Primary Care

## 2018-01-27 ENCOUNTER — Encounter: Payer: Self-pay | Admitting: Primary Care

## 2018-01-27 ENCOUNTER — Telehealth: Payer: Self-pay | Admitting: Primary Care

## 2018-01-27 VITALS — BP 140/80 | HR 78 | Temp 98.4°F | Ht 65.0 in | Wt 165.8 lb

## 2018-01-27 DIAGNOSIS — J471 Bronchiectasis with (acute) exacerbation: Secondary | ICD-10-CM | POA: Diagnosis not present

## 2018-01-27 MED ORDER — SODIUM CHLORIDE 3 % IN NEBU: INHALATION_SOLUTION | RESPIRATORY_TRACT | 3 refills | Status: DC

## 2018-01-27 MED ORDER — SODIUM CHLORIDE 3 % IN NEBU: INHALATION_SOLUTION | Freq: Two times a day (BID) | RESPIRATORY_TRACT | 3 refills | Status: DC | PRN

## 2018-01-27 NOTE — Progress Notes (Signed)
@Patient  ID: Brooke Jimenez, female    DOB: 06-01-1945, 72 y.o.   MRN: [id number]  Chief Complaint  Patient presents with  . Acute Visit    cough with mucus from lungs, nasal congestion    Referring provider: Karleen Hampshire., MD  HPI: 72 year old female, never smoker. PMH bronchiectasis. Patient of Dr. Lake Bells, last seen in July and recommended annual follow-up.  01/15/2018 Patient presents today for acute visit with complaints of dry cough, head congestion, sore throat x2 days. Reports a fever of 101.5 last night. Afebrile today in office.  Reports that her symptoms started with a really bad sore throat and then nasal congestion.  Developed tickle in her throat that caused a dry cough. No mucus production. Eating and drinking ok.  She has not taken Tylenol.  He had a crown dental procedure on Nov 11th.  Reports that her jaw is sore. Able to eat yesterday.  Took clindamycin prior to procedure.  Denies sinus pain, sob, wheeze, nausea, vomiting. Loose BM yesterday.   01/19/2018  Patient returns today for continued complains of not feeling well. Reports runny nose, sinus congestion and dry cough. Most of her cough has been due to throat tickle. Associated sore throat d/t coughing. Has to clear her throat in the morning. Some sinus pressure and teeth pain. Mucus was clear, this morning it was thicker with some yellow color. Reports right back/pleuretic pain. Symptoms started 6-7 days ago. She has not taken any cough syrup or sinus medication. Used halls. Going away on trip in 4 days.   02/01/2018 Patient returns with continued complaints of cough and congestion. Flu swab negative, throat culture negative for strep. CXR clear. Given course of doxycyline for acute exacerbation of bronchiectasis. States that her sore throat has resolved. Cough is loose and not dry. Doesn't cough mucus up, hasn't seen any color. Breathing is fine. Afebrile.    Allergies  Allergen Reactions  . Codeine Nausea And  Vomiting    DILAUDID AND TRAMADOL ARE OKAY.   Marland Kitchen Epinephrine     Heart pounds very hard  . Erythromycin Other (See Comments)    Abdominal pain   . Guaifenesin Nausea Only  . Levaquin [Levofloxacin In D5w] Nausea And Vomiting  . Neosporin  [Neomycin-Bacitracin Zn-Polymyx]     Other reaction(s): Other (See Comments) Blisters, Erythema  . Adhesive [Tape] Rash  . Nickel Rash  . Nsaids     Stomach aches  . Penicillins Rash  . Sulfa Antibiotics Rash    Immunization History  Administered Date(s) Administered  . Influenza Split 10/31/2014, 10/31/2015  . Influenza, High Dose Seasonal PF 01/11/2016, 03/20/2017  . Pneumococcal Conjugate-13 10/31/2014  . Zoster Recombinat (Shingrix) 12/18/2017    Past Medical History:  Diagnosis Date  . Acne rosacea   . Anxiety   . Arthritis    arthritis in hip   . Bronchiectasis ([place])   . Cataract    beginning  . Complication of anesthesia    "really sore throat"  . Corneal dystrophy   . DDD (degenerative disc disease), cervical    and lower back  . Diverticulosis   . Dysphagia    chronic- please see ultrasound done 4/16 15 in [facility]   . Dysrhythmia    sometimes patient has extra beats per patient   . Esophageal dysmotility   . Family history of adverse reaction to anesthesia    mother slow to wake up   . GERD (gastroesophageal reflux disease)    under control  .  Glaucoma    "suspect"  . Gout    one finger x1 attack  . H/O hiatal hernia   . 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 wears hearing aides  . Heart murmur    no problem   . History of chicken pox   . History of melanoma    melanoma- 1986 and 1997   . Hypothyroidism   . Low back pain with sciatica   . Measles    hx of  . Mitral valve prolapse   . Nocturia   . Osteoporosis   . Peripheral neuropathy    feet  . PONV (postoperative nausea and vomiting)   . PTSD (post-traumatic stress disorder) 2006  . RSD (reflex sympathetic dystrophy) 1993  . Spider veins   .  Urinary incontinence    occasional  . Varicose veins     Tobacco History: Social History   Tobacco Use  Smoking Status Never Smoker  Smokeless Tobacco Never Used   Counseling given: Not Answered   Outpatient Medications Prior to Visit  Medication Sig Dispense Refill  . calcium carbonate (TUMS - DOSED IN MG ELEMENTAL CALCIUM) 500 MG chewable tablet Chew 3 tablets by mouth daily as needed for indigestion or heartburn.     . clindamycin (CLEOCIN) 150 MG capsule Take 600 mg by mouth. One hour before dental appointments    . levothyroxine (SYNTHROID, LEVOTHROID) 75 MCG tablet Take 75 mcg by mouth daily before breakfast.    . Multiple Vitamin (MULTIVITAMIN WITH MINERALS) TABS tablet Take 1 tablet by mouth daily.    Marland Kitchen doxycycline (VIBRA-TABS) 100 MG tablet Take 1 tablet (100 mg total) by mouth 2 (two) times daily. 14 tablet 0   No facility-administered medications prior to visit.      Review of Systems  Review of Systems  Constitutional: Negative.   HENT: Positive for congestion and postnasal drip.   Respiratory: Positive for cough. Negative for shortness of breath and wheezing.   Cardiovascular: Negative.     Physical Exam  BP 140/80 (BP Location: Left Arm, Cuff Size: Normal)   Pulse 78   Temp 98.4 F (36.9 C)   Ht 5\' 5"  (1.651 m)   Wt 165 lb 12.8 oz (75.2 kg)   SpO2 95%   BMI 27.59 kg/m  Physical Exam  Constitutional: She is oriented to person, place, and time. She appears well-developed and well-nourished.  HENT:  Head: Normocephalic and atraumatic.  Eyes: Pupils are equal, round, and reactive to light. EOM are normal.  Neck: Normal range of motion. Neck supple.  Cardiovascular: Normal rate, regular rhythm and normal heart sounds.  No murmur heard. Pulmonary/Chest: Effort normal and breath sounds normal. No respiratory distress. She has no wheezes.  Rhonchi at base  Abdominal: Soft. Bowel sounds are normal. There is no tenderness.  Neurological: She is alert and  oriented to person, place, and time.  Skin: Skin is warm and dry. No rash noted. No erythema.  Psychiatric: She has a normal mood and affect. Her behavior is normal. Judgment normal.     Lab Results:  CBC    Component Value Date/Time   WBC 6.3 10/13/2016 1307   RBC 4.67 10/13/2016 1307   HGB 14.1 10/13/2016 1307   HCT 41.2 10/13/2016 1307   PLT 219 10/13/2016 1307   MCV 88.2 10/13/2016 1307   MCH 30.2 10/13/2016 1307   MCHC 34.2 10/13/2016 1307   RDW 14.5 10/13/2016 1307   LYMPHSABS 1.9 02/02/2014 1145  MONOABS 0.7 02/02/2014 1145   EOSABS 0.2 02/02/2014 1145   BASOSABS 0.0 02/02/2014 1145    BMET    Component Value Date/Time   NA 136 10/13/2016 1307   K 3.4 (L) 10/13/2016 1307   CL 102 10/13/2016 1307   CO2 25 10/13/2016 1307   GLUCOSE 107 (H) 10/13/2016 1307   BUN 7 10/13/2016 1307   CREATININE 0.85 10/13/2016 1307   CALCIUM 9.0 10/13/2016 1307   GFRNONAA >60 10/13/2016 1307   GFRAA >60 10/13/2016 1307    BNP No results found for: BNP  ProBNP No results found for: PROBNP  Imaging: Dg Chest 2 View  Result Date: 01/19/2018 CLINICAL DATA:  Bronchiectasis with acute exacerbation EXAM: CHEST - 2 VIEW COMPARISON:  10/07/2013 FINDINGS: Normal heart size. Aortic tortuosity. There is no edema, consolidation, effusion, or pneumothorax. Thoracolumbar scoliosis. IMPRESSION: No evidence of active disease. Electronically Signed   By: Monte Fantasia M.D.   On: 01/19/2018 16:37     Assessment & Plan:   Bronchiectasis [facility]) Improving clinically, reassured patient. Completed doxycyline course  Continue flutter valve three times a day Guaifenesin twice daily  Check sputum culture if able Add hypertonic saline nebs Follow up in 3 months and as needed    Martyn Ehrich, NP 02/01/2018

## 2018-01-27 NOTE — Patient Instructions (Addendum)
Continue flutter valve three time a day  Guaifenesin twice a day for cough   Sputum culture   Add hypertonic saline neb treatments twice daily as needed (helps assist with cough and mobilizing secretions)   Follow up in 3 months with full PFTs with Dr. Lake Bells      Bronchiectasis Bronchiectasis is a condition in which the airways (bronchi) are damaged and widened. This makes it difficult for the lungs to get rid of mucus. As a result, mucus gathers in the airways, and this often leads to lung infections. Infection can cause inflammation in the airways, which may further weaken and damage the bronchi. What are the causes? Bronchiectasis may be present at birth (congenital) or may develop later in life. Sometimes there is no apparent cause. Some common causes include:  Cystic fibrosis.  Recurrent lung infections (such as pneumonia, tuberculosis, or fungal infections).  Foreign bodies or other blockages in the lungs.  Breathing in fluid, food, or other foreign objects (aspiration).  What are the signs or symptoms?  Common symptoms include:  A daily cough that brings up mucus and lasts for more than 3 weeks.  Frequent lung infections (such as pneumonia, tuberculosis, or fungal infections).  Shortness of breath and wheezing.  Weakness and fatigue.  How is this diagnosed? Various tests may be done to help diagnose bronchiectasis. Tests may include:  Chest X-rays or CT scans.  Breathing tests to help determine how your lungs are working.  Sputum cultures to check for infection.  Blood tests and other tests to check for related diseases or causes, such as cystic fibrosis.  How is this treated? Treatment varies depending on the severity of the condition. Medicines may be given to loosen the mucus to be coughed up (expectorants), to relax the muscles of the air passages (bronchodilators), or to prevent or treat infections (antibiotics). Physical therapy methods may be  recommended to help clear mucus from the lungs. For severe cases, surgery may be done to remove the affected part of the lung. Follow these instructions at home:  Get plenty of rest.  Only take over-the-counter or prescription medicines as directed by your health care provider. If antibiotic medicines were prescribed, take them as directed. Finish them even if you start to feel better.  Avoid sedatives and antihistamines unless otherwise directed by your health care provider. These medicines tend to thicken the mucus in the lungs.  Perform any breathing exercises or techniques to clear the lungs as directed by your health care provider.  Drink enough fluids to keep your urine clear or pale yellow.  Consider using a cold steam vaporizer or humidifier in your room or home to help loosen secretions.  If the cough is worse at night, try sleeping in a semi-upright position in a recliner or using a couple of pillows.  Avoid cigarette smoke and lung irritants. If you smoke, quit.  Stay inside when pollution and ozone levels are high.  Stay current with vaccinations and immunizations.  Follow up with your health care provider as directed. Contact a health care provider if:  You cough up more thick, discolored mucus (sputum) that is yellow to green in color.  You have a fever or persistent symptoms for more than 2-3 days.  You cannot control your cough and are losing sleep. Get help right away if:  You cough up blood.  You have chest pain or increasing shortness of breath.  You have pain that is getting worse or is uncontrolled with medicines.  You have a fever and your symptoms suddenly get worse. This information is not intended to replace advice given to you by your health care provider. Make sure you discuss any questions you have with your health care provider. Document Released: 12/08/2006 Document Revised: 07/25/2015 Document Reviewed: 08/18/2012 Elsevier Interactive Patient  Education  2017 [employer].

## 2018-01-27 NOTE — Telephone Encounter (Signed)
LMOM explaining it is taken care of, the prescription will be sent to DME company to be covered under medicare part B.

## 2018-02-01 ENCOUNTER — Telehealth: Payer: Self-pay | Admitting: Primary Care

## 2018-02-01 ENCOUNTER — Encounter: Payer: Self-pay | Admitting: Primary Care

## 2018-02-01 MED ORDER — SODIUM CHLORIDE 3 % IN NEBU: INHALATION_SOLUTION | Freq: Two times a day (BID) | RESPIRATORY_TRACT | 5 refills | Status: DC | PRN

## 2018-02-01 NOTE — Telephone Encounter (Signed)
Pharmacy is requesting a new hypertonic saline rx with dx code attached.  This has been sent as requested.  Nothing further needed.

## 2018-02-01 NOTE — Telephone Encounter (Signed)
Spoke with Pharmacist April at [facility], states that her insurance will not cover hypertonic saline neb solution.  Error message that comes up [place] must be billed in conjunction with respiratory drug for coverage".  Pt is not on any other inhaled medication currently.  April contacted pt's DME company who stated they received the same message.  April asked what pharmacy we usually send rx to and I advised that this is typically sent to [facility].  April will contact their pharmacy to see if it will be cheaper out of pocket for pt there.    Forwarding to BQ to make aware, and see if he has any additional recommendations.

## 2018-02-01 NOTE — Assessment & Plan Note (Addendum)
Improving clinically, reassured patient. Completed doxycyline course CXR 11/26- no acute findings  Continue flutter valve three times a day Guaifenesin twice daily  Check sputum culture if able Add hypertonic saline nebs Follow up in 3 months and as needed  If not improving consider Chest CT

## 2018-02-02 NOTE — Progress Notes (Signed)
Reviewed, agree 

## 2018-02-02 NOTE — Telephone Encounter (Signed)
It sounds like they are trying to fill standard saline (0.9%) as that is often administered as a carrier with medicines like albuterol.  Please ensure that Brooke Jimenez is in fact trying to fill hypertonic saline (3 or 7%).

## 2018-02-03 NOTE — Telephone Encounter (Signed)
Spoke with pt, she states she received the hypertonic solution from [facility] for 32 dollars. Nothing further is needed.

## 2019-06-07 ENCOUNTER — Encounter: Payer: Self-pay | Admitting: Physical Therapy

## 2019-06-07 ENCOUNTER — Other Ambulatory Visit: Payer: Self-pay

## 2019-06-07 ENCOUNTER — Ambulatory Visit: Payer: Medicare Other | Attending: *Deleted | Admitting: Physical Therapy

## 2019-06-07 DIAGNOSIS — M6281 Muscle weakness (generalized): Secondary | ICD-10-CM | POA: Diagnosis not present

## 2019-06-07 DIAGNOSIS — R262 Difficulty in walking, not elsewhere classified: Secondary | ICD-10-CM

## 2019-06-07 NOTE — Therapy (Signed)
[place], [place], [address] Phone: [phone]   Fax:  [phone]  Physical Therapy Evaluation  Patient Details  Name: Dannapaola Holloman MRN: [id number] Date of Birth: Dec 17, 1945 Referring Provider (PT): Isa Rankin   Encounter Date: 06/07/2019  PT End of Session - 06/07/19 1540    Visit Number  1    Date for PT Re-Evaluation  08/07/19    Authorization Type  Medicare and BCBS    PT Start Time  1440    PT Stop Time  1530    PT Time Calculation (min)  50 min    Activity Tolerance  Patient limited by fatigue    Behavior During Therapy  [facility] for tasks assessed/performed       Past Medical History:  Diagnosis Date  . Acne rosacea   . Anxiety   . Arthritis    arthritis in hip   . Bronchiectasis ([place])   . Cataract    beginning  . Complication of anesthesia    "really sore throat"  . Corneal dystrophy   . DDD (degenerative disc disease), cervical    and lower back  . Diverticulosis   . Dysphagia    chronic- please see ultrasound done 4/16 15 in [facility]   . Dysrhythmia    sometimes patient has extra beats per patient   . Esophageal dysmotility   . Family history of adverse reaction to anesthesia    mother slow to wake up   . GERD (gastroesophageal reflux disease)    under control  . Glaucoma    "suspect"  . Gout    one finger x1 attack  . H/O hiatal hernia   . 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 wears hearing aides  . Heart murmur    no problem   . History of chicken pox   . History of melanoma    melanoma- 1986 and 1997   . Hypothyroidism   . Low back pain with sciatica   . Measles    hx of  . Mitral valve prolapse   . Nocturia   . Osteoporosis   . Peripheral neuropathy    feet  . PONV (postoperative nausea and vomiting)   . PTSD (post-traumatic stress disorder) 2006  . RSD (reflex sympathetic dystrophy) 1993  . Spider veins   . Urinary incontinence     occasional  . Varicose veins     Past Surgical History:  Procedure Laterality Date  . ESOPHAGEAL MANOMETRY N/A 12/17/2015   Procedure: ESOPHAGEAL MANOMETRY (EM);  Surgeon: Mauri Pole, MD;  Location: [facility];  Service: Endoscopy;  Laterality: N/A;  . HARDWARE REMOVAL Left 10/17/2013   Procedure: HARDWARE REMOVAL LEFT HIP;  Surgeon: Gearlean Alf, MD;  Location: [facility];  Service: Orthopedics;  Laterality: Left;  . HIP FRACTURE SURGERY Left 2006   3 screws  . INSERTION OF MESH  02/08/2014   Procedure: INSERTION OF MESH;  Surgeon: Jackolyn Confer, MD;  Location: [facility];  Service: General;;  . melanoma surgery   1986, 1997  . PARATHYROIDECTOMY  04/02/15  . [place] IMPEDANCE STUDY N/A 12/17/2015   Procedure: [place] IMPEDANCE STUDY;  Surgeon: Mauri Pole, MD;  Location: [facility];  Service: Endoscopy;  Laterality: N/A;  . TOTAL HIP ARTHROPLASTY Left 04/10/2014   Procedure: LEFT TOTAL HIP ARTHROPLASTY ANTERIOR APPROACH;  Surgeon: Gearlean Alf, MD;  Location: [facility];  Service: Orthopedics;  Laterality: Left;  .  UMBILICAL HERNIA REPAIR N/A 02/08/2014   Procedure: HERNIA REPAIR UMBILICAL ADULT WITH MESH;  Surgeon: Jackolyn Confer, MD;  Location: [facility];  Service: General;  Laterality: N/A;    There were no vitals filed for this visit.   Subjective Assessment - 06/07/19 1431    Subjective  Patient reports that she was trying to keep up with walking through covid, but when the colder weather came in November, she had to stop.  She reports that by the end of February she really felt like she was out of shape, and really started noticing difficulty with walking and breathing, she does have a diagnosis of bronchiectasis.  She also reports feeling weak and some diffiuclty with getting up from sitting.    Pertinent History  THA, GERD, anxiety, arthritis    Limitations  Standing;Walking;House hold activities    Patient Stated Goals  be stronger, walk better, get back to exercise, get up  easier from sitting, easier on the stairs    Currently in Pain?  Yes    Pain Score  2     Pain Location  Hip    Pain Orientation  Left    Pain Descriptors / Indicators  Aching;Sore    Pain Type  Acute pain    Pain Onset  More than a month ago    Aggravating Factors   walking, lying on the left side pain will go up to 7/10    Pain Relieving Factors  rest pain can be 0/10    Effect of Pain on Daily Activities  just difficulty walking         [facility] PT Assessment - 06/07/19 0001      Assessment   Medical Diagnosis  weakness, left hip pain, difficulty walking    Referring Provider (PT)  Isa Rankin    Onset Date/Surgical Date  04/09/19      Precautions   Precautions  None      Balance Screen   Has the patient fallen in the past 6 months  No    Has the patient had a decrease in activity level because of a fear of falling?   No    Is the patient reluctant to leave their home because of a fear of falling?   No      Home Environment   Additional Comments  has stairs, does housework      Prior Function   Level of Independence  Independent    Vocation  Retired    Leisure  some walking      ROM / Strength   AROM / PROM / Strength  Strength;AROM      AROM   Overall AROM Comments  Shoulders and hips WFL's, lumbar decreased 50% with c/o stiffness and tightness      Strength   Overall Strength Comments  shoulder strength 4/5, hips 4-/5, knees 4-/5, ankles 4-/5      Palpation   Palpation comment  she has tightness and some tenderness in the lumbar, she is very tender in the left GT area      Ambulation/Gait   Gait Comments  no device,      6 Minute Walk- Baseline   6 Minute Walk- Baseline  yes    HR (bpm)  85    02 Sat (%RA)  99 %    Modified Borg Scale for Dyspnea  0.5- Very, very slight shortness of breath      6 Minute walk- Post Test   6  Minute Walk Post Test  yes    HR (bpm)  130    02 Sat (%RA)  93 %    Modified Borg Scale for Dyspnea  7- Severe shortness of  breath or very hard breathing      6 minute walk test results    Aerobic Endurance Distance Walked  990    Endurance additional comments  significant wheezing on exhalation, 2 minutes post O2 96%, HR 108                Objective measurements completed on examination: See above findings.                PT Short Term Goals - 06/07/19 1606      PT SHORT TERM GOAL #1   Title  independent with HEP    Time  2    Period  Weeks    Status  New        PT Long Term Goals - 06/07/19 1606      PT LONG TERM GOAL #1   Title  go up and down stairs step over step consistently    Time  8    Period  Weeks    Status  New      PT LONG TERM GOAL #2   Title  return to independent gym program    Time  8    Period  Weeks    Status  New      PT LONG TERM GOAL #3   Title  6 minute walk test with recovery of HR within 3 minutes    Time  8    Period  Weeks    Status  New      PT LONG TERM GOAL #4   Title  increase ROM of  lumbar spine 25%    Time  8    Period  Weeks    Status  New             Plan - 06/07/19 1541    Clinical Impression Statement  Patient reports that since Covid she really stopped doing much exercise, she reports tha tshe started walking, but when the weather turned cold she stopped this, she reports that she gained 15 #, reports difficulty walking and doing stairs, reports more shortness of berath with activity, she has a diagnosis of bronchiectasis, at rest HR was 85 and O2 saturation was 99, after 6 minute walk going 990 feet her O2 sats were 93% and HR was 130, it took about 5 minutes to recover the O2 but the HR remained elevated at 99 bpm significant wheezing upon exhalation after test  she did become a little unsteady with this test.  She does have some left GT pain, has hx of THA here.    Stability/Clinical Decision Making  Stable/Uncomplicated    Clinical Decision Making  Low    Rehab Potential  Good    PT Frequency  1x / week    PT  Duration  8 weeks    PT Treatment/Interventions  ADLs/Self Care Home Management;Gait training;Stair training;Functional mobility training;Therapeutic activities;Therapeutic exercise;Balance training;Neuromuscular re-education;Manual techniques;Patient/family education;Energy conservation    PT Next Visit Plan  start endurance and strengthening    Consulted and Agree with Plan of Care  Patient       Patient will benefit from skilled therapeutic intervention in order to improve the following deficits and impairments:  Decreased range of motion, Difficulty walking, Decreased endurance, Cardiopulmonary status limiting activity,  Decreased activity tolerance, Pain, Decreased balance, Decreased strength, Postural dysfunction  Visit Diagnosis: Muscle weakness (generalized) - Plan: PT plan of care cert/re-cert  Difficulty in walking, not elsewhere classified - Plan: PT plan of care cert/re-cert     Problem List Patient Active Problem List   Diagnosis Date Noted  . Upper respiratory infection 01/15/2018  . Postherpetic neuralgia 12/25/2016  . Dysesthesia 12/25/2016  . Polyneuropathy 12/25/2016  . Exposure to influenza 04/01/2016  . Dysphagia   . Bronchiectasis without acute exacerbation ([place]) 08/30/2015  . Bronchiectasis ([place]) 08/14/2015  . Chronic rhinitis 08/14/2015  . Avascular necrosis of femur head, left ([place]) 04/10/2014  . OA (osteoarthritis) of hip 04/10/2014  . Painful orthopaedic hardware [facility]) 10/17/2013    Sumner Boast., PT 06/07/2019, 4:11 PM  [place], [place], [address] Phone: [phone]   Fax:  [phone]  Name: Kerstin Haseley MRN: [id number] Date of Birth: 12/01/1945

## 2019-06-14 ENCOUNTER — Other Ambulatory Visit: Payer: Self-pay

## 2019-06-14 ENCOUNTER — Encounter: Payer: Self-pay | Admitting: Physical Therapy

## 2019-06-14 ENCOUNTER — Ambulatory Visit: Payer: Medicare Other | Admitting: Physical Therapy

## 2019-06-14 DIAGNOSIS — R262 Difficulty in walking, not elsewhere classified: Secondary | ICD-10-CM

## 2019-06-14 DIAGNOSIS — M6281 Muscle weakness (generalized): Secondary | ICD-10-CM | POA: Diagnosis not present

## 2019-06-14 NOTE — Therapy (Signed)
[place], [place], [address] Phone: [phone]   Fax:  [phone]  Physical Therapy Treatment  Patient Details  Name: Brooke Jimenez MRN: [id number] Date of Birth: June 14, 1945 Referring Provider (PT): Isa Rankin   Encounter Date: 06/14/2019  PT End of Session - 06/14/19 1543    Visit Number  2    Date for PT Re-Evaluation  08/07/19    Authorization Type  Medicare and BCBS    PT Start Time  1450    PT Stop Time  1533    PT Time Calculation (min)  43 min    Activity Tolerance  Patient limited by fatigue    Behavior During Therapy  [facility] for tasks assessed/performed       Past Medical History:  Diagnosis Date  . Acne rosacea   . Anxiety   . Arthritis    arthritis in hip   . Bronchiectasis ([place])   . Cataract    beginning  . Complication of anesthesia    "really sore throat"  . Corneal dystrophy   . DDD (degenerative disc disease), cervical    and lower back  . Diverticulosis   . Dysphagia    chronic- please see ultrasound done 4/16 15 in EPIC   . Dysrhythmia    sometimes patient has extra beats per patient   . Esophageal dysmotility   . Family history of adverse reaction to anesthesia    mother slow to wake up   . GERD (gastroesophageal reflux disease)    under control  . Glaucoma    "suspect"  . Gout    one finger x1 attack  . H/O hiatal hernia   . 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 wears hearing aides  . Heart murmur    no problem   . History of chicken pox   . History of melanoma    melanoma- 1986 and 1997   . Hypothyroidism   . Low back pain with sciatica   . Measles    hx of  . Mitral valve prolapse   . Nocturia   . Osteoporosis   . Peripheral neuropathy    feet  . PONV (postoperative nausea and vomiting)   . PTSD (post-traumatic stress disorder) 2006  . RSD (reflex sympathetic dystrophy) 1993  . Spider veins   . Urinary incontinence    occasional  . Varicose veins     Past Surgical History:  Procedure Laterality Date  . ESOPHAGEAL MANOMETRY N/A 12/17/2015   Procedure: ESOPHAGEAL MANOMETRY (EM);  Surgeon: Mauri Pole, MD;  [facility];  Service: Endoscopy;  Laterality: N/A;  . HARDWARE REMOVAL Left 10/17/2013   Procedure: HARDWARE REMOVAL LEFT HIP;  Surgeon: Gearlean Alf, MD;  [facility];  Service: Orthopedics;  Laterality: Left;  . HIP FRACTURE SURGERY Left 2006   3 screws  . INSERTION OF MESH  02/08/2014   Procedure: INSERTION OF MESH;  Surgeon: Jackolyn Confer, MD;  [facility];  Service: General;;  . melanoma surgery   1986, 1997  . PARATHYROIDECTOMY  04/02/15  . [place] IMPEDANCE STUDY N/A 12/17/2015   Procedure: [place] IMPEDANCE STUDY;  Surgeon: Mauri Pole, MD;  [facility];  Service: Endoscopy;  Laterality: N/A;  . TOTAL HIP ARTHROPLASTY Left 04/10/2014   Procedure: LEFT TOTAL HIP ARTHROPLASTY ANTERIOR APPROACH;  Surgeon: Gearlean Alf, MD;  [facility];  Service: Orthopedics;  Laterality: Left;  .  UMBILICAL HERNIA REPAIR N/A 02/08/2014   Procedure: HERNIA REPAIR UMBILICAL ADULT WITH MESH;  Surgeon: Jackolyn Confer, MD;  [facility];  Service: General;  Laterality: N/A;    There were no vitals filed for this visit.  Subjective Assessment - 06/14/19 1455    Subjective  Pt reports she has been feeling winded with walking; would like to work on improving endurance.    Pertinent History  THA, GERD, anxiety, arthritis    Patient Stated Goals  be stronger, walk better, get back to exercise, get up easier from sitting, easier on the stairs    Currently in Pain?  No/denies    Pain Score  0-No pain                       [facility] Adult PT Treatment/Exercise - 06/14/19 0001      Exercises   Exercises  Knee/Hip;Shoulder;Neck      Neck Exercises: Machines for Strengthening   Cybex Chest Press  10#, 2 sets      Knee/Hip Exercises: Machines for  Strengthening   Cybex Knee Extension  10# x15, 2 sets    Cybex Knee Flexion  25# x15, 2 sets      Knee/Hip Exercises: Standing   Other Standing Knee Exercises  STS with touch to chair; x15, 2sets      Shoulder Exercises: ROM/Strengthening   UBE (Upper Arm Bike)  x5 min; 2.5 min forward/2.5 min backward L4   96% O2, 96bpm   Lat Pull  15 reps   20#, 2 sets   Lat Pull Limitations  cues for form and adjusting weight    Cybex Row  15 reps   15#, 2 sets   Cybex Row Limitations  cues for form and machine setup               PT Short Term Goals - 06/07/19 1606      PT SHORT TERM GOAL #1   Title  independent with HEP    Time  2    Period  Weeks    Status  New        PT Long Term Goals - 06/07/19 1606      PT LONG TERM GOAL #1   Title  go up and down stairs step over step consistently    Time  8    Period  Weeks    Status  New      PT LONG TERM GOAL #2   Title  return to independent gym program    Time  8    Period  Weeks    Status  New      PT LONG TERM GOAL #3   Title  6 minute walk test with recovery of HR within 3 minutes    Time  8    Period  Weeks    Status  New      PT LONG TERM GOAL #4   Title  increase ROM of  lumbar spine 25%    Time  8    Period  Weeks    Status  New            Plan - 06/14/19 1545    Clinical Impression Statement  Pt reports to clinic today with decreased levels of hip pain and increased recovery following exercise. After machine exercises, O2 dropped to 94% HR115bpm but recovered within 2 min to 97%O2 76bpm. Pt requires frequent rest breaks d/t breathing difficulty/wheezing with  exertion. Continue to progress endurance ex's and prepare patient for return to independent exercise program.    Rehab Potential  Good    PT Frequency  1x / week    PT Duration  8 weeks    PT Treatment/Interventions  ADLs/Self Care Home Management;Gait training;Stair training;Functional mobility training;Therapeutic activities;Therapeutic  exercise;Balance training;Neuromuscular re-education;Manual techniques;Patient/family education;Energy conservation    PT Next Visit Plan  start endurance and strengthening    Consulted and Agree with Plan of Care  Patient       Patient will benefit from skilled therapeutic intervention in order to improve the following deficits and impairments:  Decreased range of motion, Difficulty walking, Decreased endurance, Cardiopulmonary status limiting activity, Decreased activity tolerance, Pain, Decreased balance, Decreased strength, Postural dysfunction  Visit Diagnosis: Muscle weakness (generalized)  Difficulty in walking, not elsewhere classified     Problem List Patient Active Problem List   Diagnosis Date Noted  . Upper respiratory infection 01/15/2018  . Postherpetic neuralgia 12/25/2016  . Dysesthesia 12/25/2016  . Polyneuropathy 12/25/2016  . Exposure to influenza 04/01/2016  . Dysphagia   . Bronchiectasis without acute exacerbation ([place]) 08/30/2015  . Bronchiectasis ([place]) 08/14/2015  . Chronic rhinitis 08/14/2015  . Avascular necrosis of femur head, left ([place]) 04/10/2014  . OA (osteoarthritis) of hip 04/10/2014  . Painful orthopaedic hardware [facility]) 10/17/2013   Amador Cunas, PT, DPT Donald Prose Kaityln Kallstrom 06/14/2019, 3:54 PM  [place], [place], [address] Phone: [phone]   Fax:  [phone]  Name: Brooke Jimenez MRN: [id number] Date of Birth: November 29, 1945

## 2019-06-21 ENCOUNTER — Ambulatory Visit: Payer: Medicare Other | Admitting: Physical Therapy

## 2019-06-21 ENCOUNTER — Other Ambulatory Visit: Payer: Self-pay

## 2019-06-21 ENCOUNTER — Encounter: Payer: Self-pay | Admitting: Physical Therapy

## 2019-06-21 DIAGNOSIS — M6281 Muscle weakness (generalized): Secondary | ICD-10-CM | POA: Diagnosis not present

## 2019-06-21 DIAGNOSIS — R262 Difficulty in walking, not elsewhere classified: Secondary | ICD-10-CM

## 2019-06-21 NOTE — Therapy (Signed)
[place], [place], [address] Phone: [phone]   Fax:  [phone]  Physical Therapy Treatment  Patient Details  Name: Brooke Jimenez MRN: [id number] Date of Birth: 1945/07/22 Referring Provider (PT): Isa Rankin   Encounter Date: 06/21/2019  PT End of Session - 06/21/19 1618    Visit Number  3    Date for PT Re-Evaluation  08/07/19    Authorization Type  Medicare and BCBS    PT Start Time  [id number]    PT Stop Time  1530    PT Time Calculation (min)  45 min    Activity Tolerance  Patient tolerated treatment well    Behavior During Therapy  [facility] for tasks assessed/performed       Past Medical History:  Diagnosis Date  . Acne rosacea   . Anxiety   . Arthritis    arthritis in hip   . Bronchiectasis ([place])   . Cataract    beginning  . Complication of anesthesia    "really sore throat"  . Corneal dystrophy   . DDD (degenerative disc disease), cervical    and lower back  . Diverticulosis   . Dysphagia    chronic- please see ultrasound done 4/16 15 in EPIC   . Dysrhythmia    sometimes patient has extra beats per patient   . Esophageal dysmotility   . Family history of adverse reaction to anesthesia    mother slow to wake up   . GERD (gastroesophageal reflux disease)    under control  . Glaucoma    "suspect"  . Gout    one finger x1 attack  . H/O hiatal hernia   . 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 wears hearing aides  . Heart murmur    no problem   . History of chicken pox   . History of melanoma    melanoma- 1986 and 1997   . Hypothyroidism   . Low back pain with sciatica   . Measles    hx of  . Mitral valve prolapse   . Nocturia   . Osteoporosis   . Peripheral neuropathy    feet  . PONV (postoperative nausea and vomiting)   . PTSD (post-traumatic stress disorder) 2006  . RSD (reflex sympathetic dystrophy) 1993  . Spider veins   . Urinary incontinence    occasional  . Varicose veins     Past Surgical History:  Procedure Laterality Date  . ESOPHAGEAL MANOMETRY N/A 12/17/2015   Procedure: ESOPHAGEAL MANOMETRY (EM);  Surgeon: Mauri Pole, MD;  [facility];  Service: Endoscopy;  Laterality: N/A;  . HARDWARE REMOVAL Left 10/17/2013   Procedure: HARDWARE REMOVAL LEFT HIP;  Surgeon: Gearlean Alf, MD;  [facility];  Service: Orthopedics;  Laterality: Left;  . HIP FRACTURE SURGERY Left 2006   3 screws  . INSERTION OF MESH  02/08/2014   Procedure: INSERTION OF MESH;  Surgeon: Jackolyn Confer, MD;  [facility];  Service: General;;  . melanoma surgery   1986, 1997  . PARATHYROIDECTOMY  04/02/15  . [place] IMPEDANCE STUDY N/A 12/17/2015   Procedure: [place] IMPEDANCE STUDY;  Surgeon: Mauri Pole, MD;  [facility];  Service: Endoscopy;  Laterality: N/A;  . TOTAL HIP ARTHROPLASTY Left 04/10/2014   Procedure: LEFT TOTAL HIP ARTHROPLASTY ANTERIOR APPROACH;  Surgeon: Gearlean Alf, MD;  [facility];  Service: Orthopedics;  Laterality: Left;  .  UMBILICAL HERNIA REPAIR N/A 02/08/2014   Procedure: HERNIA REPAIR UMBILICAL ADULT WITH MESH;  Surgeon: Jackolyn Confer, MD;  [facility];  Service: General;  Laterality: N/A;    There were no vitals filed for this visit.  Subjective Assessment - 06/21/19 1505    Subjective  Pt reports that she is not having any pain; still would like to work on improving endurance. Pt request 1-2 more weeks of appointments with focus on strengthening/endurance to get her ready for returning to an independent exercise program.    Patient Stated Goals  be stronger, walk better, get back to exercise, get up easier from sitting, easier on the stairs    Currently in Pain?  No/denies    Pain Score  0-No pain                       [facility] Adult PT Treatment/Exercise - 06/21/19 0001      Exercises   Exercises  Lumbar      Lumbar Exercises: Stretches   Double Knee to  Chest Stretch  5 reps;10 seconds    Double Knee to Chest Stretch Limitations  with cues for eccentric lowering of BLE to engage core      Lumbar Exercises: Seated   Other Seated Lumbar Exercises  Yoga boat pose modified; hold 20 sec x3    Other Seated Lumbar Exercises  seated twists with yellow medicine ball x10      Shoulder Exercises: ROM/Strengthening   UBE (Upper Arm Bike)  x6 min; 3 min forward/3 min backward L4    Lat Pull  15 reps    Lat Pull Limitations  20#    Cybex Row  15 reps    Cybex Row Limitations  20#               PT Short Term Goals - 06/07/19 1606      PT SHORT TERM GOAL #1   Title  independent with HEP    Time  2    Period  Weeks    Status  New        PT Long Term Goals - 06/07/19 1606      PT LONG TERM GOAL #1   Title  go up and down stairs step over step consistently    Time  8    Period  Weeks    Status  New      PT LONG TERM GOAL #2   Title  return to independent gym program    Time  8    Period  Weeks    Status  New      PT LONG TERM GOAL #3   Title  6 minute walk test with recovery of HR within 3 minutes    Time  8    Period  Weeks    Status  New      PT LONG TERM GOAL #4   Title  increase ROM of  lumbar spine 25%    Time  8    Period  Weeks    Status  New            Plan - 06/21/19 1618    Clinical Impression Statement  Pt demonstrates increased tolerance to exercise but is still limited by endurance deficits and deconditioning. O2 maintained from 95-98% throughout tx. Pt demonstrates increased independence with exercise program. Plan to progress LE/UE strengthening and endurance for next 2 weeks with potential d/c to home program.  PT Treatment/Interventions  ADLs/Self Care Home Management;Gait training;Stair training;Functional mobility training;Therapeutic activities;Therapeutic exercise;Balance training;Neuromuscular re-education;Manual techniques;Patient/family education;Energy conservation    PT Next Visit Plan   Progress endurance and strengthening. Add core exercise into exercise sheet.    Consulted and Agree with Plan of Care  Patient       Patient will benefit from skilled therapeutic intervention in order to improve the following deficits and impairments:  Decreased range of motion, Difficulty walking, Decreased endurance, Cardiopulmonary status limiting activity, Decreased activity tolerance, Pain, Decreased balance, Decreased strength, Postural dysfunction  Visit Diagnosis: Muscle weakness (generalized)  Difficulty in walking, not elsewhere classified     Problem List Patient Active Problem List   Diagnosis Date Noted  . Upper respiratory infection 01/15/2018  . Postherpetic neuralgia 12/25/2016  . Dysesthesia 12/25/2016  . Polyneuropathy 12/25/2016  . Exposure to influenza 04/01/2016  . Dysphagia   . Bronchiectasis without acute exacerbation ([place]) 08/30/2015  . Bronchiectasis ([place]) 08/14/2015  . Chronic rhinitis 08/14/2015  . Avascular necrosis of femur head, left ([place]) 04/10/2014  . OA (osteoarthritis) of hip 04/10/2014  . Painful orthopaedic hardware [facility]) 10/17/2013   Amador Cunas, PT, DPT Donald Prose Yariah Selvey 06/21/2019, 4:21 PM  [place], [place], [address] Phone: [phone]   Fax:  [phone]  Name: Otisha Davignon MRN: [id number] Date of Birth: Dec 06, 1945

## 2019-06-28 ENCOUNTER — Encounter: Payer: Self-pay | Admitting: Physical Therapy

## 2019-06-28 ENCOUNTER — Ambulatory Visit: Payer: Medicare Other | Attending: *Deleted | Admitting: Physical Therapy

## 2019-06-28 ENCOUNTER — Other Ambulatory Visit: Payer: Self-pay

## 2019-06-28 DIAGNOSIS — R262 Difficulty in walking, not elsewhere classified: Secondary | ICD-10-CM | POA: Diagnosis present

## 2019-06-28 DIAGNOSIS — M6281 Muscle weakness (generalized): Secondary | ICD-10-CM

## 2019-06-28 NOTE — Therapy (Signed)
[place], [place], [address] Phone: [phone]   Fax:  [phone]  Physical Therapy Treatment  Patient Details  Name: Brooke Jimenez MRN: [id number] Date of Birth: [birth year] Referring Provider (PT): Isa Rankin   Encounter Date: 06/28/2019  PT End of Session - 06/28/19 1447    Visit Number  4    Date for PT Re-Evaluation  08/07/19    Authorization Type  Medicare and BCBS    PT Start Time  1400    PT Stop Time  1445    PT Time Calculation (min)  45 min    Activity Tolerance  Patient tolerated treatment well    Behavior During Therapy  [facility] for tasks assessed/performed       Past Medical History:  Diagnosis Date  . Acne rosacea   . Anxiety   . Arthritis    arthritis in hip   . Bronchiectasis ([place])   . Cataract    beginning  . Complication of anesthesia    "really sore throat"  . Corneal dystrophy   . DDD (degenerative disc disease), cervical    and lower back  . Diverticulosis   . Dysphagia    chronic- please see ultrasound done 4/16 15 in EPIC   . Dysrhythmia    sometimes patient has extra beats per patient   . Esophageal dysmotility   . Family history of adverse reaction to anesthesia    mother slow to wake up   . GERD (gastroesophageal reflux disease)    under control  . Glaucoma    "suspect"  . Gout    one finger x1 attack  . H/O hiatal hernia   . 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 wears hearing aides  . Heart murmur    no problem   . History of chicken pox   . History of melanoma    melanoma- 1986 and 1997   . Hypothyroidism   . Low back pain with sciatica   . Measles    hx of  . Mitral valve prolapse   . Nocturia   . Osteoporosis   . Peripheral neuropathy    feet  . PONV (postoperative nausea and vomiting)   . PTSD (post-traumatic stress disorder) 2006  . RSD (reflex sympathetic dystrophy) 1993  . Spider veins   . Urinary incontinence    occasional  . Varicose veins     Past Surgical History:  Procedure Laterality Date  . ESOPHAGEAL MANOMETRY N/A 12/17/2015   Procedure: ESOPHAGEAL MANOMETRY (EM);  Surgeon: Mauri Pole, MD;  [facility];  Service: Endoscopy;  Laterality: N/A;  . HARDWARE REMOVAL Left 10/17/2013   Procedure: HARDWARE REMOVAL LEFT HIP;  Surgeon: Gearlean Alf, MD;  [facility];  Service: Orthopedics;  Laterality: Left;  . HIP FRACTURE SURGERY Left 2006   3 screws  . INSERTION OF MESH  02/08/2014   Procedure: INSERTION OF MESH;  Surgeon: Jackolyn Confer, MD;  [facility];  Service: General;;  . melanoma surgery   1986, 1997  . PARATHYROIDECTOMY  04/02/15  . [place] IMPEDANCE STUDY N/A 12/17/2015   Procedure: [place] IMPEDANCE STUDY;  Surgeon: Mauri Pole, MD;  [facility];  Service: Endoscopy;  Laterality: N/A;  . TOTAL HIP ARTHROPLASTY Left 04/10/2014   Procedure: LEFT TOTAL HIP ARTHROPLASTY ANTERIOR APPROACH;  Surgeon: Gearlean Alf, MD;  [facility];  Service: Orthopedics;  Laterality: Left;  .  UMBILICAL HERNIA REPAIR N/A 02/08/2014   Procedure: HERNIA REPAIR UMBILICAL ADULT WITH MESH;  Surgeon: Jackolyn Confer, MD;  [facility];  Service: General;  Laterality: N/A;    There were no vitals filed for this visit.  Subjective Assessment - 06/28/19 1420    Subjective  Pt reports she would like to run through independent exercise program this rx.    Pertinent History  THA, GERD, anxiety, arthritis    Patient Stated Goals  be stronger, walk better, get back to exercise, get up easier from sitting, easier on the stairs    Currently in Pain?  No/denies    Pain Score  0-No pain                       [facility] Adult PT Treatment/Exercise - 06/28/19 0001      Knee/Hip Exercises: Machines for Strengthening   Cybex Knee Extension  10# x15, 2 sets    Cybex Knee Flexion  25# x15, 2 sets      Shoulder Exercises: ROM/Strengthening   Nustep  L6 x 10 min     Lat Pull  15 reps    Lat Pull Limitations  25#    Cybex Press  15 reps    Cybex Press Limitations  10#    Cybex Row  15 reps    Cybex Row Limitations  25#             PT Education - 06/28/19 1447    Education Details  Pt educated on progression of independent exercise program    Person(s) Educated  Patient    Methods  Explanation    Comprehension  Verbalized understanding       PT Short Term Goals - 06/07/19 1606      PT SHORT TERM GOAL #1   Title  independent with HEP    Time  2    Period  Weeks    Status  New        PT Long Term Goals - 06/07/19 1606      PT LONG TERM GOAL #1   Title  go up and down stairs step over step consistently    Time  8    Period  Weeks    Status  New      PT LONG TERM GOAL #2   Title  return to independent gym program    Time  8    Period  Weeks    Status  New      PT LONG TERM GOAL #3   Title  6 minute walk test with recovery of HR within 3 minutes    Time  8    Period  Weeks    Status  New      PT LONG TERM GOAL #4   Title  increase ROM of  lumbar spine 25%    Time  8    Period  Weeks    Status  New            Plan - 06/28/19 1448    Clinical Impression Statement  Pt continues to progress tolerance to exercise; endurance deficits and deconditioning improving. Continue to educated and progress ex's; potential d/c next rx to independent exercise program.    PT Treatment/Interventions  ADLs/Self Care Home Management;Gait training;Stair training;Functional mobility training;Therapeutic activities;Therapeutic exercise;Balance training;Neuromuscular re-education;Manual techniques;Patient/family education;Energy conservation    PT Next Visit Plan  Progress endurance and strengthening. Add core exercise into exercise sheet.  Consulted and Agree with Plan of Care  Patient       Patient will benefit from skilled therapeutic intervention in order to improve the following deficits and impairments:  Decreased range of  motion, Difficulty walking, Decreased endurance, Cardiopulmonary status limiting activity, Decreased activity tolerance, Pain, Decreased balance, Decreased strength, Postural dysfunction  Visit Diagnosis: Muscle weakness (generalized)  Difficulty in walking, not elsewhere classified     Problem List Patient Active Problem List   Diagnosis Date Noted  . Upper respiratory infection 01/15/2018  . Postherpetic neuralgia 12/25/2016  . Dysesthesia 12/25/2016  . Polyneuropathy 12/25/2016  . Exposure to influenza 04/01/2016  . Dysphagia   . Bronchiectasis without acute exacerbation ([place]) 08/30/2015  . Bronchiectasis ([place]) 08/14/2015  . Chronic rhinitis 08/14/2015  . Avascular necrosis of femur head, left ([place]) 04/10/2014  . OA (osteoarthritis) of hip 04/10/2014  . Painful orthopaedic hardware [facility]) 10/17/2013   Amador Cunas, PT, DPT Donald Prose Sabian Kuba 06/28/2019, 2:49 PM  [place], [place], [address] Phone: [phone]   Fax:  [phone]  Name: Evaleen Cranston MRN: [id number] Date of Birth: [birth year]

## 2019-07-01 ENCOUNTER — Ambulatory Visit (INDEPENDENT_AMBULATORY_CARE_PROVIDER_SITE_OTHER): Payer: Medicare Other

## 2019-07-01 ENCOUNTER — Encounter: Payer: Self-pay | Admitting: Podiatry

## 2019-07-01 ENCOUNTER — Ambulatory Visit (INDEPENDENT_AMBULATORY_CARE_PROVIDER_SITE_OTHER): Payer: Medicare Other | Admitting: Podiatry

## 2019-07-01 ENCOUNTER — Other Ambulatory Visit: Payer: Self-pay

## 2019-07-01 VITALS — Temp 97.3°F

## 2019-07-01 DIAGNOSIS — M2011 Hallux valgus (acquired), right foot: Secondary | ICD-10-CM

## 2019-07-01 DIAGNOSIS — B351 Tinea unguium: Secondary | ICD-10-CM

## 2019-07-01 DIAGNOSIS — Q828 Other specified congenital malformations of skin: Secondary | ICD-10-CM

## 2019-07-01 DIAGNOSIS — M2012 Hallux valgus (acquired), left foot: Secondary | ICD-10-CM | POA: Diagnosis not present

## 2019-07-01 DIAGNOSIS — M79675 Pain in left toe(s): Secondary | ICD-10-CM

## 2019-07-01 DIAGNOSIS — M79674 Pain in right toe(s): Secondary | ICD-10-CM

## 2019-07-01 NOTE — Patient Instructions (Signed)
Bunion  A bunion is a bump on the base of the big toe that forms when the bones of the big toe joint move out of position. Bunions may be small at first, but they often get larger over time. They can make walking painful. What are the causes? A bunion may be caused by:  Wearing narrow or pointed shoes that force the big toe to press against the other toes.  Abnormal foot development that causes the foot to roll inward (pronate).  Changes in the foot that are caused by certain diseases, such as rheumatoid arthritis or polio.  A foot injury. What increases the risk? The following factors may make you more likely to develop this condition:  Wearing shoes that squeeze the toes together.  Having certain diseases, such as: ? Rheumatoid arthritis. ? Polio. ? Cerebral palsy.  Having family members who have bunions.  Being born with a foot deformity, such as flat feet or low arches.  Doing activities that put a lot of pressure on the feet, such as ballet dancing. What are the signs or symptoms? The main symptom of a bunion is a noticeable bump on the big toe. Other symptoms may include:  Pain.  Swelling around the big toe.  Redness and inflammation.  Thick or hardened skin on the big toe or between the toes.  Stiffness or loss of motion in the big toe.  Trouble with walking. How is this diagnosed? A bunion may be diagnosed based on your symptoms, medical history, and activities. You may have tests, such as:  X-rays. These allow your health care provider to check the position of the bones in your foot and look for damage to your joint. They also help your health care provider determine the severity of your bunion and the best way to treat it.  Joint aspiration. In this test, a sample of fluid is removed from the toe joint. This test may be done if you are in a lot of pain. It helps rule out diseases that cause painful swelling of the joints, such as arthritis. How is this  treated? Treatment depends on the severity of your symptoms. The goal of treatment is to relieve symptoms and prevent the bunion from getting worse. Your health care provider may recommend:  Wearing shoes that have a wide toe box.  Using bunion pads to cushion the affected area.  Taping your toes together to keep them in a normal position.  Placing a device inside your shoe (orthotics) to help reduce pressure on your toe joint.  Taking medicine to ease pain, inflammation, and swelling.  Applying heat or ice to the affected area.  Doing stretching exercises.  Surgery to remove scar tissue and move the toes back into their normal position. This treatment is rare. Follow these instructions at home: Managing pain, stiffness, and swelling   If directed, put ice on the painful area: ? Put ice in a plastic bag. ? Place a towel between your skin and the bag. ? Leave the ice on for 20 minutes, 2-3 times a day. Activity   If directed, apply heat to the affected area before you exercise. Use the heat source that your health care provider recommends, such as a moist heat pack or a heating pad. ? Place a towel between your skin and the heat source. ? Leave the heat on for 20-30 minutes. ? Remove the heat if your skin turns bright red. This is especially important if you are unable to feel pain,   heat, or cold. You may have a greater risk of getting burned.  Do exercises as told by your health care provider. General instructions  Support your toe joint with proper footwear, shoe padding, or taping as told by your health care provider.  Take over-the-counter and prescription medicines only as told by your health care provider.  Keep all follow-up visits as told by your health care provider. This is important. Contact a health care provider if your symptoms:  Get worse.  Do not improve in 2 weeks. Get help right away if you have:  Severe pain and trouble with walking. Summary  A  bunion is a bump on the base of the big toe that forms when the bones of the big toe joint move out of position.  Bunions can make walking painful.  Treatment depends on the severity of your symptoms.  Support your toe joint with proper footwear, shoe padding, or taping as told by your health care provider. This information is not intended to replace advice given to you by your health care provider. Make sure you discuss any questions you have with your health care provider. Document Revised: 08/17/2017 Document Reviewed: 06/23/2017 Elsevier Patient Education  2020 Elsevier Inc.  

## 2019-07-04 NOTE — Progress Notes (Signed)
Subjective:   Patient ID: Brooke Jimenez, female   DOB: 74 y.o.   MRN: [id number]   HPI Patient presents with chronic bunion deformity left over right stating it is getting increasingly sore making it hard for me to walk.  Patient states she is tried wider shoes she is tried soaks and modifications without relief of symptoms.  Patient does not smoke and does like to be active   ROS      Objective:  Physical Exam Vitals and nursing note reviewed.  Constitutional:      Appearance: She is well-developed.  Pulmonary:     Effort: Pulmonary effort is normal.  Musculoskeletal:        General: Normal range of motion.  Skin:    General: Skin is warm.  Neurological:     Mental Status: She is alert.     Neurovascular status intact muscle strength was found to be adequate range of motion within normal limits.  Patient is found to have large hyperostosis medial aspect first metatarsal left over right with redness around the joint surface pain with pressure and deviation of the hallux against the second toe.  Patient has good digital perfusion and is well oriented x3     Assessment:  Structural HAV deformity bilateral with patient also found to have mycotic toenail infection bilateral     Plan:  H&P conditions reviewed.  I recommended due to long-term nature of condition bunion correction and I do think we could do distal osteotomy even though we may not get complete correction radiographically I think clinically will do better and I do not think there will be as much concerned about recovery and complications.  I explained this to the patient and she wants to go

## 2019-07-05 ENCOUNTER — Ambulatory Visit: Payer: Medicare Other | Admitting: Physical Therapy

## 2019-07-05 ENCOUNTER — Encounter: Payer: Self-pay | Admitting: Physical Therapy

## 2019-07-05 ENCOUNTER — Other Ambulatory Visit: Payer: Self-pay

## 2019-07-05 DIAGNOSIS — M6281 Muscle weakness (generalized): Secondary | ICD-10-CM | POA: Diagnosis not present

## 2019-07-05 DIAGNOSIS — R262 Difficulty in walking, not elsewhere classified: Secondary | ICD-10-CM

## 2019-07-05 NOTE — Therapy (Signed)
[facility] [place], [place], [address] Phone: [phone]   Fax:  [phone]  Physical Therapy Treatment  Patient Details  Name: Brooke Jimenez MRN: [id number] Date of Birth: Jun 22, 1945 Referring Provider (PT): Isa Rankin   Encounter Date: 07/05/2019  PT End of Session - 07/05/19 1535    Visit Number  5    Date for PT Re-Evaluation  08/07/19    PT Start Time  1500    PT Stop Time  1530    PT Time Calculation (min)  30 min    Activity Tolerance  Patient tolerated treatment well    Behavior During Therapy  [facility] for tasks assessed/performed       Past Medical History:  Diagnosis Date  . Acne rosacea   . Anxiety   . Arthritis    arthritis in hip   . Bronchiectasis ([place])   . Cataract    beginning  . Complication of anesthesia    "really sore throat"  . Corneal dystrophy   . DDD (degenerative disc disease), cervical    and lower back  . Diverticulosis   . Dysphagia    chronic- please see ultrasound done 4/16 15 in EPIC   . Dysrhythmia    sometimes patient has extra beats per patient   . Esophageal dysmotility   . Family history of adverse reaction to anesthesia    mother slow to wake up   . GERD (gastroesophageal reflux disease)    under control  . Glaucoma    "suspect"  . Gout    one finger x1 attack  . H/O hiatal hernia   . 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 wears hearing aides  . Heart murmur    no problem   . History of chicken pox   . History of melanoma    melanoma- 1986 and 1997   . Hypothyroidism   . Low back pain with sciatica   . Measles    hx of  . Mitral valve prolapse   . Nocturia   . Osteoporosis   . Peripheral neuropathy    feet  . PONV (postoperative nausea and vomiting)   . PTSD (post-traumatic stress disorder) 2006  . RSD (reflex sympathetic dystrophy) 1993  . Spider veins   . Urinary incontinence    occasional  . Varicose veins     Past  Surgical History:  Procedure Laterality Date  . ESOPHAGEAL MANOMETRY N/A 12/17/2015   Procedure: ESOPHAGEAL MANOMETRY (EM);  Surgeon: Mauri Pole, MD;  Location: [facility];  Service: Endoscopy;  Laterality: N/A;  . HARDWARE REMOVAL Left 10/17/2013   Procedure: HARDWARE REMOVAL LEFT HIP;  Surgeon: Gearlean Alf, MD;  Location: [facility];  Service: Orthopedics;  Laterality: Left;  . HIP FRACTURE SURGERY Left 2006   3 screws  . INSERTION OF MESH  02/08/2014   Procedure: INSERTION OF MESH;  Surgeon: Jackolyn Confer, MD;  Location: [facility];  Service: General;;  . melanoma surgery   1986, 1997  . PARATHYROIDECTOMY  04/02/15  . [place] IMPEDANCE STUDY N/A 12/17/2015   Procedure: [place] IMPEDANCE STUDY;  Surgeon: Mauri Pole, MD;  [facility];  Service: Endoscopy;  Laterality: N/A;  . TOTAL HIP ARTHROPLASTY Left 04/10/2014   Procedure: LEFT TOTAL HIP ARTHROPLASTY ANTERIOR APPROACH;  Surgeon: Gearlean Alf, MD;  [facility];  Service: Orthopedics;  Laterality: Left;  . UMBILICAL HERNIA REPAIR N/A 02/08/2014  Procedure: HERNIA REPAIR UMBILICAL ADULT WITH MESH;  Surgeon: Jackolyn Confer, MD;  [facility]: [facility];  Service: General;  Laterality: N/A;    There were no vitals filed for this visit.  Subjective Assessment - 07/05/19 1511    Subjective  Pt arrived late d/t being stuck in gas line; pt informed appt will be 30 min, pt requests to still have appt    Currently in Pain?  No/denies    Pain Score  0-No pain                       [facility] Adult PT Treatment/Exercise - 07/05/19 0001      Knee/Hip Exercises: Machines for Strengthening   Cybex Knee Extension  10# x15, 2 sets    Cybex Knee Flexion  25# x15, 2 sets      Shoulder Exercises: ROM/Strengthening   Nustep  L5 x 10 min    Lat Pull  15 reps    Lat Pull Limitations  25#    Cybex Press  15 reps    Cybex Press Limitations  10#    Cybex Row  15 reps    Cybex Row Limitations  25#                PT Short Term Goals - 06/07/19 1606      PT SHORT TERM GOAL #1   Title  independent with HEP    Time  2    Period  Weeks    Status  New        PT Long Term Goals - 06/07/19 1606      PT LONG TERM GOAL #1   Title  go up and down stairs step over step consistently    Time  8    Period  Weeks    Status  New      PT LONG TERM GOAL #2   Title  return to independent gym program    Time  8    Period  Weeks    Status  New      PT LONG TERM GOAL #3   Title  6 minute walk test with recovery of HR within 3 minutes    Time  8    Period  Weeks    Status  New      PT LONG TERM GOAL #4   Title  increase ROM of  lumbar spine 25%    Time  8    Period  Weeks    Status  New            Plan - 07/05/19 1535    Clinical Impression Statement  Pt arrived to PT late but wished to continue with shortened session. Pt still requires cuing to set up machines appropriately; potential d/c next rx to independent exercise program. Six minute walk test next rx.    PT Treatment/Interventions  ADLs/Self Care Home Management;Gait training;Stair training;Functional mobility training;Therapeutic activities;Therapeutic exercise;Balance training;Neuromuscular re-education;Manual techniques;Patient/family education;Energy conservation    PT Next Visit Plan  Progress endurance and strengthening. Add core exercise into exercise sheet.    Consulted and Agree with Plan of Care  Patient       Patient will benefit from skilled therapeutic intervention in order to improve the following deficits and impairments:  Decreased range of motion, Difficulty walking, Decreased endurance, Cardiopulmonary status limiting activity, Decreased activity tolerance, Pain, Decreased balance, Decreased strength, Postural dysfunction  Visit Diagnosis: Muscle weakness (generalized)  Difficulty in walking,  not elsewhere classified     Problem List Patient Active Problem List   Diagnosis Date Noted  .  Upper respiratory infection 01/15/2018  . Postherpetic neuralgia 12/25/2016  . Dysesthesia 12/25/2016  . Polyneuropathy 12/25/2016  . Exposure to influenza 04/01/2016  . Dysphagia   . Bronchiectasis without acute exacerbation ([place]) 08/30/2015  . Bronchiectasis ([place]) 08/14/2015  . Chronic rhinitis 08/14/2015  . Avascular necrosis of femur head, left ([place]) 04/10/2014  . OA (osteoarthritis) of hip 04/10/2014  . Painful orthopaedic hardware [facility]) 10/17/2013   Amador Cunas, PT, DPT Donald Prose Mellie Buccellato 07/05/2019, 3:52 PM  [place], [place], [address] Phone: [phone]   Fax:  [phone]  Name: Brooke Jimenez MRN: [id number] Date of Birth: 1945-06-16

## 2019-07-12 ENCOUNTER — Ambulatory Visit: Payer: Medicare Other | Admitting: Physical Therapy

## 2019-07-14 ENCOUNTER — Ambulatory Visit: Payer: Medicare Other | Admitting: Physical Therapy

## 2019-07-14 ENCOUNTER — Encounter: Payer: Self-pay | Admitting: Physical Therapy

## 2019-07-14 ENCOUNTER — Other Ambulatory Visit: Payer: Self-pay

## 2019-07-14 DIAGNOSIS — R262 Difficulty in walking, not elsewhere classified: Secondary | ICD-10-CM

## 2019-07-14 DIAGNOSIS — M6281 Muscle weakness (generalized): Secondary | ICD-10-CM | POA: Diagnosis not present

## 2019-07-14 NOTE — Therapy (Signed)
[place], [place], [address] Phone: [phone]   Fax:  [phone]  Physical Therapy Treatment PHYSICAL THERAPY DISCHARGE SUMMARY  Plan: Patient agrees to discharge.  Patient goals were met. Patient is being discharged due to being pleased with the current functional level.  ?????     Patient Details  Name: Brooke Jimenez MRN: [id number] Date of Birth: 03-27-1945 Referring Provider (PT): Isa Rankin   Encounter Date: 07/14/2019  PT End of Session - 07/14/19 1751    Visit Number  6    Authorization Type  Medicare and BCBS    PT Start Time  1700    PT Stop Time  1750    PT Time Calculation (min)  50 min    Activity Tolerance  Patient tolerated treatment well    Behavior During Therapy  [facility] for tasks assessed/performed       Past Medical History:  Diagnosis Date  . Acne rosacea   . Anxiety   . Arthritis    arthritis in hip   . Bronchiectasis ([place])   . Cataract    beginning  . Complication of anesthesia    "really sore throat"  . Corneal dystrophy   . DDD (degenerative disc disease), cervical    and lower back  . Diverticulosis   . Dysphagia    chronic- please see ultrasound done 4/16 15 in EPIC   . Dysrhythmia    sometimes patient has extra beats per patient   . Esophageal dysmotility   . Family history of adverse reaction to anesthesia    mother slow to wake up   . GERD (gastroesophageal reflux disease)    under control  . Glaucoma    "suspect"  . Gout    one finger x1 attack  . H/O hiatal hernia   . 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 wears hearing aides  . Heart murmur    no problem   . History of chicken pox   . History of melanoma    melanoma- 1986 and 1997   . Hypothyroidism   . Low back pain with sciatica   . Measles    hx of  . Mitral valve prolapse   . Nocturia   . Osteoporosis   . Peripheral neuropathy    feet  . PONV (postoperative  nausea and vomiting)   . PTSD (post-traumatic stress disorder) 2006  . RSD (reflex sympathetic dystrophy) 1993  . Spider veins   . Urinary incontinence    occasional  . Varicose veins     Past Surgical History:  Procedure Laterality Date  . ESOPHAGEAL MANOMETRY N/A 12/17/2015   Procedure: ESOPHAGEAL MANOMETRY (EM);  Surgeon: Mauri Pole, MD;  [facility];  Service: Endoscopy;  Laterality: N/A;  . HARDWARE REMOVAL Left 10/17/2013   Procedure: HARDWARE REMOVAL LEFT HIP;  Surgeon: Gearlean Alf, MD;  [facility];  Service: Orthopedics;  Laterality: Left;  . HIP FRACTURE SURGERY Left 2006   3 screws  . INSERTION OF MESH  02/08/2014   Procedure: INSERTION OF MESH;  Surgeon: Jackolyn Confer, MD;  [facility];  Service: General;;  . melanoma surgery   1986, 1997  . PARATHYROIDECTOMY  04/02/15  . [place] IMPEDANCE STUDY N/A 12/17/2015   Procedure: [place] IMPEDANCE STUDY;  Surgeon: Mauri Pole, MD;  [facility];  Service: Endoscopy;  Laterality: N/A;  . TOTAL HIP ARTHROPLASTY Left 04/10/2014  Procedure: LEFT TOTAL HIP ARTHROPLASTY ANTERIOR APPROACH;  Surgeon: Gearlean Alf, MD;  [facility];  Service: Orthopedics;  Laterality: Left;  . UMBILICAL HERNIA REPAIR N/A 02/08/2014   Procedure: HERNIA REPAIR UMBILICAL ADULT WITH MESH;  Surgeon: Jackolyn Confer, MD;  [facility];  Service: General;  Laterality: N/A;    There were no vitals filed for this visit.  Subjective Assessment - 07/14/19 1743    Subjective  Pt reports some remaining pain at surgical site on L hip. Pt would like for today to be last visit and d/c to maintenance program.    Currently in Pain?  No/denies    Pain Score  0-No pain    Pain Location  Hip    Pain Orientation  Left         [facility] PT Assessment - 07/14/19 0001      6 Minute Walk- Baseline   6 Minute Walk- Baseline  yes    HR (bpm)  89    02 Sat (%RA)  98 %    Modified Borg Scale for Dyspnea  0.5- Very, very  slight shortness of breath      6 Minute walk- Post Test   6 Minute Walk Post Test  yes    HR (bpm)  109    02 Sat (%RA)  97 %    Modified Borg Scale for Dyspnea  5- Strong or hard breathing      6 minute walk test results    Aerobic Endurance Distance Walked  1000    Endurance additional comments  significant wheezing on exhale, 2 minutes post O2 97% HR 96                    [facility] Adult PT Treatment/Exercise - 07/14/19 0001      Self-Care   Self-Care  Other Self-Care Comments    Other Self-Care Comments   Spent all of rx, excluding 6 min walk test, going over HEP maintenance routine including legs, arms, and aerobic exercise.              PT Education - 07/14/19 1751    Education Details  Pt educated on d/c, when to return, and participation in maintenance program.    Person(s) Educated  Patient    Methods  Explanation    Comprehension  Verbalized understanding       PT Short Term Goals - 07/14/19 1753      PT SHORT TERM GOAL #1   Title  independent with HEP    Time  2    Period  Weeks    Status  Achieved        PT Long Term Goals - 07/14/19 1754      PT LONG TERM GOAL #1   Title  go up and down stairs step over step consistently    Time  8    Period  Weeks    Status  Achieved      PT LONG TERM GOAL #2   Title  return to independent gym program    Time  8    Period  Weeks    Status  Achieved      PT LONG TERM GOAL #3   Title  6 minute walk test with recovery of HR within 3 minutes    Time  8    Period  Weeks    Status  Achieved      PT LONG TERM GOAL #4   Title  increase ROM of  lumbar spine 25%    Time  8    Period  Weeks    Status  Achieved            Plan - 07/14/19 1752    Clinical Impression Statement  Pt performance on 6 minute walk has improved since first rx; pt HR recovered with 2 minutes of rest, significant wheezing still present on exhalation. Pt recommended for d/c; pt would like to move to maintenance exercise  program. Jeris Penta through HEP pt demonstrated independence with exercise this rx.    PT Next Visit Plan  d/c this rx to maintenance program    Consulted and Agree with Plan of Care  Patient       Patient will benefit from skilled therapeutic intervention in order to improve the following deficits and impairments:     Visit Diagnosis: Muscle weakness (generalized)  Difficulty in walking, not elsewhere classified     Problem List Patient Active Problem List   Diagnosis Date Noted  . Upper respiratory infection 01/15/2018  . Postherpetic neuralgia 12/25/2016  . Dysesthesia 12/25/2016  . Polyneuropathy 12/25/2016  . Exposure to influenza 04/01/2016  . Dysphagia   . Bronchiectasis without acute exacerbation ([place]) 08/30/2015  . Bronchiectasis ([place]) 08/14/2015  . Chronic rhinitis 08/14/2015  . Avascular necrosis of femur head, left ([place]) 04/10/2014  . OA (osteoarthritis) of hip 04/10/2014  . Painful orthopaedic hardware [facility]) 10/17/2013   Amador Cunas, PT, DPT Donald Prose Sugg 07/14/2019, 5:55 PM  [place] [facility], [place], [address] Phone: [phone]   Fax:  [phone]  Name: Brooke Jimenez MRN: [id number] Date of Birth: May 03, 1945

## 2019-08-10 NOTE — Progress Notes (Signed)
CARDIOLOGY CONSULT NOTE       Patient ID: Brooke Jimenez MRN: [id number] DOB/AGE: 30-May-1945 74 y.o.  Admit date: (Not on file) Referring Physician: [name] Primary Physician: Karleen Hampshire., MD Primary Cardiologist: New Reason for Consultation: Mitral Valve Disease  Active Problems:   * No active hospital problems. *   HPI:  74 y.o. with Bronchiectasis followed by Dr Hartford Poli.  Referred by Dr [name] for mitral valve disease Her last office note indicates no murmur and there are no recent echo's to review She has had a lot of GI consults for silent aspiration GERD IBS and dysmotility No history of rheumatic disease, Phen-Fen use or SBE   She has been more sedentary since COVID and gained weight Has lung specialist at [facility] No fever, sputum chest pain , edema, palpitations or syndope  Activity also limited by left hip pain Has had replacement by Al[facility]io and due to see him again   ROS All other systems reviewed and negative except as noted above  Past Medical History:  Diagnosis Date  . Acne rosacea   . Anxiety   . Arthritis    arthritis in hip   . Bronchiectasis ([place])   . Cataract    beginning  . Complication of anesthesia    "really sore throat"  . Corneal dystrophy   . DDD (degenerative disc disease), cervical    and lower back  . Diverticulosis   . Dysphagia    chronic- please see ultrasound done 4/16 15 in [facility]   . Dysrhythmia    sometimes patient has extra beats per patient   . Esophageal dysmotility   . Family history of adverse reaction to anesthesia    mother slow to wake up   . GERD (gastroesophageal reflux disease)    under control  . Glaucoma    "suspect"  . Gout    one finger x1 attack  . H/O hiatal hernia   . 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 wears hearing aides  . Heart murmur    no problem   . History of chicken pox   . History of melanoma    melanoma- 1986 and 1997   . Hypothyroidism   . Low back pain with sciatica   . Measles    hx  of  . Mitral valve prolapse   . Nocturia   . Osteoporosis   . Peripheral neuropathy    feet  . PONV (postoperative nausea and vomiting)   . PTSD (post-traumatic stress disorder) 2006  . RSD (reflex sympathetic dystrophy) 1993  . Spider veins   . Urinary incontinence    occasional  . Varicose veins     Family History  Problem Relation Age of Onset  . Cancer Mother   . Hypothyroidism Mother   . Congestive Heart Failure Mother   . Hypertension Mother   . Heart failure Father     Social History   Socioeconomic History  . Marital status: Married    Spouse name: Not on file  . Number of children: Not on file  . Years of education: Not on file  . Highest education level: Not on file  Occupational History  . Not on file  Tobacco Use  . Smoking status: Never Smoker  . Smokeless tobacco: Never Used  Substance and Sexual Activity  . Alcohol use: No    Alcohol/week: 0.0 standard drinks  . Drug use: No  . Sexual activity: Not on file  Other Topics Concern  .  Not on file  Social History Narrative  . Not on file   Social Determinants of Health   Financial Resource Strain:   . Difficulty of Paying Living Expenses:   Food Insecurity:   . Worried About Charity fundraiser in the Last Year:   . Arboriculturist in the Last Year:   Transportation Needs:   . Film/video editor (Medical):   Marland Kitchen Lack of Transportation (Non-Medical):   Physical Activity:   . Days of Exercise per Week:   . Minutes of Exercise per Session:   Stress:   . Feeling of Stress :   Social Connections:   . Frequency of Communication with Friends and Family:   . Frequency of Social Gatherings with Friends and Family:   . Attends Religious Services:   . Active Member of Clubs or Organizations:   . Attends Archivist Meetings:   Marland Kitchen Marital Status:   Intimate Partner Violence:   . Fear of Current or Ex-Partner:   . Emotionally Abused:   Marland Kitchen Physically Abused:   . Sexually Abused:     Past  Surgical History:  Procedure Laterality Date  . ESOPHAGEAL MANOMETRY N/A 12/17/2015   Procedure: ESOPHAGEAL MANOMETRY (EM);  Surgeon: Mauri Pole, MD;  Location: [facility];  Service: Endoscopy;  Laterality: N/A;  . HARDWARE REMOVAL Left 10/17/2013   Procedure: HARDWARE REMOVAL LEFT HIP;  Surgeon: Gearlean Alf, MD;  Location: [facility];  Service: Orthopedics;  Laterality: Left;  . HIP FRACTURE SURGERY Left 2006   3 screws  . INSERTION OF MESH  02/08/2014   Procedure: INSERTION OF MESH;  Surgeon: Jackolyn Confer, MD;  Location: [facility];  Service: General;;  . melanoma surgery   1986, 1997  . PARATHYROIDECTOMY  04/02/15  . [place] IMPEDANCE STUDY N/A 12/17/2015   Procedure: [place] IMPEDANCE STUDY;  Surgeon: Mauri Pole, MD;  Location: [facility];  Service: Endoscopy;  Laterality: N/A;  . TOTAL HIP ARTHROPLASTY Left 04/10/2014   Procedure: LEFT TOTAL HIP ARTHROPLASTY ANTERIOR APPROACH;  Surgeon: Gearlean Alf, MD;  Location: [facility];  Service: Orthopedics;  Laterality: Left;  . UMBILICAL HERNIA REPAIR N/A 02/08/2014   Procedure: HERNIA REPAIR UMBILICAL ADULT WITH MESH;  Surgeon: Jackolyn Confer, MD;  Location: [facility];  Service: General;  Laterality: N/A;      Current Outpatient Medications:  .  calcium carbonate (TUMS - DOSED IN MG ELEMENTAL CALCIUM) 500 MG chewable tablet, Chew 3 tablets by mouth daily as needed for indigestion or heartburn. , Disp: , Rfl:  .  clindamycin (CLEOCIN) 150 MG capsule, Take 600 mg by mouth. One hour before dental appointments, Disp: , Rfl:  .  levothyroxine (SYNTHROID, LEVOTHROID) 75 MCG tablet, Take 75 mcg by mouth daily before breakfast., Disp: , Rfl:  .  Multiple Vitamin (MULTIVITAMIN WITH MINERALS) TABS tablet, Take 1 tablet by mouth daily., Disp: , Rfl:  .  sodium chloride HYPERTONIC 3 % nebulizer solution, Take by nebulization 2 (two) times daily as needed for other (cough and congestion). DX: J47.9, Disp: 750 mL, Rfl: 5    Physical Exam: There  were no vitals taken for this visit.    Affect appropriate Healthy:  appears stated age 32: normal Neck supple with no adenopathy JVP normal no bruits no thyromegaly Lungs clear with no wheezing and good diaphragmatic motion Heart:  S1/S2 no murmur, no rub, gallop or click PMI normal Abdomen: benighn, BS positve, no tenderness, no AAA no bruit.  No HSM  or HJR Distal pulses intact with no bruits No edema Neuro non-focal Skin warm and dry No muscular weakness   Labs:   Lab Results  Component Value Date   WBC 6.3 10/13/2016   HGB 14.1 10/13/2016   HCT 41.2 10/13/2016   MCV 88.2 10/13/2016   PLT 219 10/13/2016      Radiology: No results found.  EKG: SR rate 89 normal    ASSESSMENT AND PLAN:   1. Bronchiectasis:  F/u pulmonary Dr Wilnette Kales  ? Related to her GI issues and silent aspiration 2.  GI:  GERD/IBS f/u with GI has been seen at both [facility] and [facility] 3. Mitral Valve Disease:  Historical diagnosis no murmur echo ordered for dyspnea  4. Thyroid:  On synthroid replacement TSH normal  5. Dyspnea:  Likely pulmonary made worse by weight gain and inactivity Echo ordered for RV/LV function estimate PA pressure and assess MV.  Will also check lexiscan myovue to r/o CAD silent ischemia as cause although again this seems unlikely given clinical picture   F/U with cardiology PRN if tests normal   Signed: Jenkins Rouge 08/15/2019, 4:06 PM

## 2019-08-15 ENCOUNTER — Other Ambulatory Visit: Payer: Self-pay

## 2019-08-15 ENCOUNTER — Encounter: Payer: Self-pay | Admitting: Cardiovascular Disease

## 2019-08-15 ENCOUNTER — Ambulatory Visit (INDEPENDENT_AMBULATORY_CARE_PROVIDER_SITE_OTHER): Payer: Medicare Other | Admitting: Cardiovascular Disease

## 2019-08-15 VITALS — BP 148/86 | HR 89 | Ht 65.0 in | Wt 177.0 lb

## 2019-08-15 DIAGNOSIS — R0600 Dyspnea, unspecified: Secondary | ICD-10-CM | POA: Diagnosis not present

## 2019-08-15 DIAGNOSIS — I059 Rheumatic mitral valve disease, unspecified: Secondary | ICD-10-CM

## 2019-08-15 NOTE — Patient Instructions (Addendum)
Medication Instructions:  *If you need a refill on your cardiac medications before your next appointment, please call your pharmacy*  Lab Work: If you have labs (blood work) drawn today and your tests are completely normal, you will receive your results only by: . MyChart Message (if you have MyChart) OR . A paper copy in the mail If you have any lab test that is abnormal or we need to change your treatment, we will call you to review the results.  Testing/Procedures: Your physician has requested that you have an echocardiogram. Echocardiography is a painless test that uses sound waves to create images of your heart. It provides your doctor with information about the size and shape of your heart and how well your heart's chambers and valves are working. This procedure takes approximately one hour. There are no restrictions for this procedure.  Your physician has requested that you have a lexiscan myoview. For further information please visit [email or web address]. Please follow instruction sheet, as given.  Follow-Up: At [facility], you and your health needs are our priority.  As part of our continuing mission to provide you with exceptional heart care, we have created designated Provider Care Teams.  These Care Teams include your primary Cardiologist (physician) and Advanced Practice Providers (APPs -  Physician Assistants and Nurse Practitioners) who all work together to provide you with the care you need, when you need it.  We recommend signing up for the patient portal called "MyChart".  Sign up information is provided on this After Visit Summary.  MyChart is used to connect with patients for Virtual Visits (Telemedicine).  Patients are able to view lab/test results, encounter notes, upcoming appointments, etc.  Non-urgent messages can be sent to your provider as well.   To learn more about what you can do with MyChart, go to [email or web address].    Your next appointment:   As  needed  The format for your next appointment:   In Person  Provider:   You may see Dr. Nishan or one of the following Advanced Practice Providers on your designated Care Team:    Lori Gerhardt, NP  Laura Ingold, NP  Jill McDaniel, NP    

## 2019-09-13 ENCOUNTER — Telehealth (HOSPITAL_COMMUNITY): Payer: Self-pay | Admitting: *Deleted

## 2019-09-13 NOTE — Telephone Encounter (Signed)
Patient given detailed instructions per Myocardial Perfusion Study Information Sheet for the test on 09/16/19 Patient notified to arrive 15 minutes early and that it is imperative to arrive on time for appointment to keep from having the test rescheduled.  If you need to cancel or reschedule your appointment, please call the office within 24 hours of your appointment. . Patient verbalized understanding. Kirstie Peri

## 2019-09-16 ENCOUNTER — Ambulatory Visit (HOSPITAL_COMMUNITY): Payer: Medicare Other | Attending: Cardiology

## 2019-09-16 ENCOUNTER — Ambulatory Visit (HOSPITAL_BASED_OUTPATIENT_CLINIC_OR_DEPARTMENT_OTHER): Payer: Medicare Other

## 2019-09-16 ENCOUNTER — Other Ambulatory Visit: Payer: Self-pay

## 2019-09-16 DIAGNOSIS — R0600 Dyspnea, unspecified: Secondary | ICD-10-CM | POA: Insufficient documentation

## 2019-09-16 DIAGNOSIS — I059 Rheumatic mitral valve disease, unspecified: Secondary | ICD-10-CM | POA: Diagnosis not present

## 2019-09-16 LAB — ECHOCARDIOGRAM COMPLETE
Area-P 1/2: 3.65 cm2
Height: 65 in
P 1/2 time: 188 msec
S' Lateral: 2.3 cm
Weight: 2832 oz

## 2019-09-16 LAB — MYOCARDIAL PERFUSION IMAGING
LV dias vol: 62 mL (ref 46–106)
LV sys vol: 22 mL
Peak HR: 102 {beats}/min
Rest HR: 82 {beats}/min
SDS: 2
SRS: 1
SSS: 3
TID: 1

## 2019-09-16 MED ORDER — TECHNETIUM TC 99M TETROFOSMIN IV KIT
10.9000 | PACK | Freq: Once | INTRAVENOUS | Status: AC | PRN
  Administered 2019-09-16: 10.9 via INTRAVENOUS
  Filled 2019-09-16: qty 11

## 2019-09-16 MED ORDER — REGADENOSON 0.4 MG/5ML IV SOLN
0.4000 mg | Freq: Once | INTRAVENOUS | Status: AC
  Administered 2019-09-16: 0.4 mg via INTRAVENOUS

## 2019-09-16 MED ORDER — TECHNETIUM TC 99M TETROFOSMIN IV KIT
32.4000 | PACK | Freq: Once | INTRAVENOUS | Status: AC | PRN
  Administered 2019-09-16: 32.4 via INTRAVENOUS
  Filled 2019-09-16: qty 33

## 2019-09-19 ENCOUNTER — Telehealth: Payer: Self-pay | Admitting: Cardiovascular Disease

## 2019-09-19 NOTE — Telephone Encounter (Signed)
I spoke with patient and reviewed echo and stress test results with her. She is asking what range Dr Johnsie Cancel recommends her heart rate be in when she exercises. She said it is OK to send this answer through my chart

## 2019-09-19 NOTE — Telephone Encounter (Signed)
Pt would like for Pat to give her a call asap if possible.

## 2019-09-20 NOTE — Telephone Encounter (Signed)
220-age is PMHR and would take 70% of that as her max

## 2019-09-20 NOTE — Telephone Encounter (Signed)
I spoke with patient and gave her information from Dr Johnsie Cancel. Will send to patient through my chart message per her request.

## 2019-10-24 ENCOUNTER — Ambulatory Visit: Payer: Medicare Other | Admitting: Primary Care

## 2019-10-27 ENCOUNTER — Ambulatory Visit: Payer: Medicare Other | Admitting: Primary Care

## 2020-01-11 ENCOUNTER — Ambulatory Visit (INDEPENDENT_AMBULATORY_CARE_PROVIDER_SITE_OTHER): Payer: Medicare Other

## 2020-01-11 ENCOUNTER — Encounter: Payer: Self-pay | Admitting: Primary Care

## 2020-01-11 ENCOUNTER — Ambulatory Visit (INDEPENDENT_AMBULATORY_CARE_PROVIDER_SITE_OTHER): Payer: Medicare Other | Admitting: Primary Care

## 2020-01-11 ENCOUNTER — Telehealth: Payer: Self-pay | Admitting: Primary Care

## 2020-01-11 ENCOUNTER — Other Ambulatory Visit: Payer: Self-pay

## 2020-01-11 VITALS — BP 140/90 | HR 104 | Ht 65.0 in | Wt 173.4 lb

## 2020-01-11 DIAGNOSIS — J471 Bronchiectasis with (acute) exacerbation: Secondary | ICD-10-CM

## 2020-01-11 DIAGNOSIS — J479 Bronchiectasis, uncomplicated: Secondary | ICD-10-CM

## 2020-01-11 MED ORDER — GUAIFENESIN ER 600 MG PO TB12
600.0000 mg | ORAL_TABLET | Freq: Two times a day (BID) | ORAL | 0 refills | Status: DC
Start: 2020-01-11 — End: 2021-04-29

## 2020-01-11 MED ORDER — DOXYCYCLINE HYCLATE 100 MG PO TABS: 100.0000 mg | ORAL_TABLET | Freq: Two times a day (BID) | ORAL | 0 refills | Status: DC

## 2020-01-11 NOTE — Telephone Encounter (Signed)
Spoke with pt. She states that she is not being seen at [facility]. Pt has been scheduled with Beth today at 1130, pt is fully vaccinated. Nothing further was needed.

## 2020-01-11 NOTE — Telephone Encounter (Signed)
We haven't seen her in two years, she was a former Designer, television/film set patient. Looks like she has been following with [facility] pulmonary. If they have been managing her I would prefer her to contact them. Otherwise yes we can see her in office if she has been vaccinated. I do recommend if she wants to re-establish with [place] she set up a visit as well with Dr. Silas Flood

## 2020-01-11 NOTE — Telephone Encounter (Signed)
Spoke with pt. States that she is not feeling well. Reports sore throat, cough and wheezing. Cough is non productive. Denies chest tightness, shortness of breath, fever or sick contacts. Symptoms started 3-4 days ago. Advised pt that we have not seen her in almost 2 years and that we would not be able to treat her over the phone. Pt wants to come in to be evaluated.  Beth - please advise. Thanks.

## 2020-01-11 NOTE — Assessment & Plan Note (Signed)
Patient was seen today for the very start of an acute exacerbation of her bronchiectasis. Recommend patient use flutter valve three times a day, add mucinex 600mg  twice daily and sending in RX for doxycycline 100mg  1 tab twice daily x 10 days. Checking CXR today and sputum sample if able. She needs to establish with new [facility] pulmonary provider, former [facility] patient.

## 2020-01-11 NOTE — Progress Notes (Signed)
@Patient  ID: Brooke Jimenez, female    DOB: [birth year], 74 y.o.   MRN: [id number]  Chief Complaint  Patient presents with  . Acute Visit    Pt states her husband was recently tested for covid due to having a virus. States she began coughing 1 day ago states she is not coughing up any phlegm,has had some wheezing, increased SOB, and a sore throat. Pt has not had any fever as temp this morning was 97.7 temporal.    Referring provider: Karleen Hampshire., MD  HPI: 74 year old female, never smoker. PMH bronchiectasis. Former Dr. Pennie Banter patient, last see on 01/27/18. Added hypertonic saline nebulizer twice daily. Maintained on flutter valve.   Previous [facility] pulmonary encounters: 01/15/2018 Patient presents today for acute visit with complaints of dry cough, head congestion, sore throat x2 days. Reports a fever of 101.5 last night. Afebrile today in office.  Reports that her symptoms started with a really bad sore throat and then nasal congestion.  Developed tickle in her throat that caused a dry cough. No mucus production. Eating and drinking ok.  She has not taken Tylenol.  He had a crown dental procedure on Nov 11th.  Reports that her jaw is sore. Able to eat yesterday.  Took clindamycin prior to procedure.  Denies sinus pain, sob, wheeze, nausea, vomiting. Loose BM yesterday.   01/19/2018  Patient returns today for continued complains of not feeling well. Reports runny nose, sinus congestion and dry cough. Most of her cough has been due to throat tickle. Associated sore throat d/t coughing. Has to clear her throat in the morning. Some sinus pressure and teeth pain. Mucus was clear, this morning it was thicker with some yellow color. Reports right back/pleuretic pain. Symptoms started 6-7 days ago. She has not taken any cough syrup or sinus medication. Used halls. Going away on trip in 4 days.   02/01/2018 Patient returns with continued complaints of cough and congestion. Flu swab negative, throat  culture negative for strep. CXR clear. Given course of doxycyline for acute exacerbation of bronchiectasis. States that her sore throat has resolved. Cough is loose and not dry. Doesn't cough mucus up, hasn't seen any color. Breathing is fine. Afebrile.    01/11/2020- Interim hx  Patient presents today for acute vsiit. Former Dr. Lake Bells patient, last seen in our office in December 2019. She has been following with Dr. Olena Heckle with [facility] for bronchiectasis and [facility]. She was last seen by Dr. Olena Heckle in March for televisit. Recommended if patient experiences any exacerbations to get sputum culture and if needs abx recommended treating for 14 days.   She developed dry cough and PND/nasal congestion yesterday. Associated sore throat. States that her husband is also sick with cough and fever, he was at a funeral last week but was not as careful as he has been in the past wearing a mask and got a covid test which was negative. She had similar complaints of sore throat and nasal congestion around this time of year back in 2019. She has been using halls lozengers for cough. She does not know how to use [place] device properly, she is unsure of setting she should be using. She is not currently taking any expectorants. States that her temperature is usually 97.7 but today was 98.0. She had clindamycin prior to dental cleaning. She is on as needed oral Doxycycline for rosacea, she started this last week. She is to stop taking abx when her symptoms improve.  Allergies  Allergen Reactions  . Penicillins Rash and Hives  . Codeine Nausea And Vomiting and Other (See Comments)    DILAUDID AND TRAMADOL ARE OKAY.  Vomiting DILAUDID AND TRAMADOL ARE OKAY.  Vomiting DILAUDID AND TRAMADOL ARE OKAY.    Marland Kitchen Levofloxacin Nausea Only, Nausea And Vomiting and Other (See Comments)    Vomiting Vomiting   . Levaquin [Levofloxacin In D5w] Nausea And Vomiting  . Neosporin   [Neomycin-Bacitracin Zn-Polymyx]     Other reaction(s): Other (See Comments) Blisters, Erythema  . Adhesive [Tape] Rash  . Epinephrine Nausea Only and Other (See Comments)    Heart pounds very hard Heart pounds very hard Heart pounds very hard Heart pounds very hard Heart pounds very hard   . Erythromycin Other (See Comments), Nausea Only and Nausea And Vomiting    Abdominal pain  Abdominal pain  Abdominal pain  Abdominal pain   . Guaifenesin Nausea Only  . Minocycline Dermatitis    Skin discoloration on ankles per pt Skin discoloration on ankles per pt, but has had it since  . Nickel Rash  . Nsaids     Stomach aches  . Sulfa Antibiotics Rash    Immunization History  Administered Date(s) Administered  . Influenza Split 06/13/2008, 11/13/2010, 11/12/2011, 12/15/2012, 10/31/2014, 10/31/2015, 01/11/2016, 03/20/2017, 12/10/2017, 11/10/2018  . Influenza, High Dose Seasonal PF 01/11/2016, 03/20/2017, 12/10/2017, 10/26/2019  . Influenza,inj,quad, With Preservative 11/25/2018  . Influenza-Unspecified 06/13/2008, 11/13/2010, 11/12/2011, 12/15/2012, 10/31/2014, 11/10/2018  . PFIZER SARS-COV-2 Vaccination 03/15/2019, 04/05/2019  . Pneumococcal Conjugate-13 10/31/2014, 12/28/2014  . Pneumococcal Polysaccharide-23 07/02/2016, 02/03/2018  . Pneumococcal-Unspecified 04/25/2018  . Tdap 12/03/2010  . Zoster 08/12/2010  . Zoster Recombinat (Shingrix) 12/18/2017, 04/15/2018    Past Medical History:  Diagnosis Date  . Acne rosacea   . Anxiety   . Arthritis    arthritis in hip   . Bronchiectasis ([place])   . Cataract    beginning  . Complication of anesthesia    "really sore throat"  . Corneal dystrophy   . DDD (degenerative disc disease), cervical    and lower back  . Diverticulosis   . Dysphagia    chronic- please see ultrasound done 4/16 15 in [facility]   . Dysrhythmia    sometimes patient has extra beats per patient   . Esophageal dysmotility   . Family history of adverse reaction  to anesthesia    mother slow to wake up   . GERD (gastroesophageal reflux disease)    under control  . Glaucoma    "suspect"  . Gout    one finger x1 attack  . H/O hiatal hernia   . 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 wears hearing aides  . Heart murmur    no problem   . History of chicken pox   . History of melanoma    melanoma- 1986 and 1997   . Hypothyroidism   . Low back pain with sciatica   . Measles    hx of  . Mitral valve prolapse   . Nocturia   . Osteoporosis   . Peripheral neuropathy    feet  . PONV (postoperative nausea and vomiting)   . PTSD (post-traumatic stress disorder) 2006  . RSD (reflex sympathetic dystrophy) 1993  . Spider veins   . Urinary incontinence    occasional  . Varicose veins     Tobacco History: Social History   Tobacco Use  Smoking Status Never Smoker  Smokeless Tobacco Never Used   Counseling  given: Not Answered   Outpatient Medications Prior to Visit  Medication Sig Dispense Refill  . calcium carbonate (TUMS - DOSED IN MG ELEMENTAL CALCIUM) 500 MG chewable tablet Chew 3 tablets by mouth daily as needed for indigestion or heartburn.     . levothyroxine (SYNTHROID, LEVOTHROID) 75 MCG tablet Take 75 mcg by mouth daily before breakfast.    . Multiple Vitamin (MULTIVITAMIN WITH MINERALS) TABS tablet Take 1 tablet by mouth daily.    . clindamycin (CLEOCIN) 150 MG capsule Take 600 mg by mouth. One hour before dental appointments (Patient not taking: Reported on 01/11/2020)    . sodium chloride (MURO 128) 5 % ophthalmic ointment Muro 128 5 % eye ointment    . sodium chloride HYPERTONIC 3 % nebulizer solution Take by nebulization 2 (two) times daily as needed for other (cough and congestion). DX: J47.9 750 mL 5   No facility-administered medications prior to visit.   Review of Systems  Review of Systems  Constitutional: Negative for chills and fever.  HENT: Positive for postnasal drip and sore throat.   Respiratory: Positive  for cough.    Physical Exam  BP 140/90 (BP Location: Left Arm, Cuff Size: Normal)   Pulse (!) 104   Ht 5\' 5"  (1.651 m)   Wt 173 lb 6.4 oz (78.7 kg)   SpO2 98%   BMI 28.86 kg/m  Physical Exam Constitutional:      Appearance: Normal appearance.  HENT:     Mouth/Throat:     Mouth: Mucous membranes are moist.     Pharynx: Oropharynx is clear. Uvula midline. Posterior oropharyngeal erythema present. No oropharyngeal exudate.     Tonsils: 0 on the right. 0 on the left.  Cardiovascular:     Rate and Rhythm: Normal rate and regular rhythm.  Pulmonary:     Effort: Pulmonary effort is normal. No respiratory distress.     Breath sounds: Rales present. No wheezing or rhonchi.  Musculoskeletal:     Cervical back: Normal range of motion and neck supple.  Skin:    General: Skin is warm and dry.  Neurological:     General: No focal deficit present.     Mental Status: She is alert and oriented to person, place, and time. Mental status is at baseline.  Psychiatric:        Mood and Affect: Mood normal.        Behavior: Behavior normal.        Thought Content: Thought content normal.        Judgment: Judgment normal.      Lab Results:  CBC    Component Value Date/Time   WBC 6.3 10/13/2016 1307   RBC 4.67 10/13/2016 1307   HGB 14.1 10/13/2016 1307   HCT 41.2 10/13/2016 1307   PLT 219 10/13/2016 1307   MCV 88.2 10/13/2016 1307   MCH 30.2 10/13/2016 1307   MCHC 34.2 10/13/2016 1307   RDW 14.5 10/13/2016 1307   LYMPHSABS 1.9 02/02/2014 1145   MONOABS 0.7 02/02/2014 1145   EOSABS 0.2 02/02/2014 1145   BASOSABS 0.0 02/02/2014 1145    BMET    Component Value Date/Time   NA 136 10/13/2016 1307   K 3.4 (L) 10/13/2016 1307   CL 102 10/13/2016 1307   CO2 25 10/13/2016 1307   GLUCOSE 107 (H) 10/13/2016 1307   BUN 7 10/13/2016 1307   CREATININE 0.85 10/13/2016 1307   CALCIUM 9.0 10/13/2016 1307   GFRNONAA >60 10/13/2016 1307   GFRAA >  60 10/13/2016 1307    BNP No results  found for: BNP  ProBNP No results found for: PROBNP  Imaging: No results found.   Assessment & Plan:   Bronchiectasis without acute exacerbation [facility]) Patient was seen today for the very start of an acute exacerbation of her bronchiectasis. Recommend patient use flutter valve three times a day, add mucinex 600mg  twice daily and sending in RX for doxycycline 100mg  1 tab twice daily x 10 days. Checking CXR today and sputum sample if able. She needs to establish with new [facility] pulmonary provider, former [facility] patient.    Martyn Ehrich, NP 01/11/2020

## 2020-01-11 NOTE — Patient Instructions (Addendum)
Recommendations: Use flutter valve 3 times a day Take mucinex 600mg  twice a day Sample of mucus if able    Orders: Sputum sample (ordered) CXR re bronchiectasis (ordered)  Rx: Doxycycline 1 tab twice daily x 14 days (sent)  Follow-up: First available with either Dr. Silas Flood or Dr. Erin Fulling for new patient visit/bronchiectasis    Bronchiectasis  Bronchiectasis is a condition in which the airways in the lungs (bronchi) are damaged and widened. The condition makes it hard for the lungs to get rid of mucus, and it causes mucus to gather in the bronchi. This condition often leads to lung infections, which can make the condition worse. What are the causes? You can be born with this condition or you can develop it later in life. Common causes of this condition include:  Cystic fibrosis.  Repeated lung infections, such as pneumonia or tuberculosis.  An object or other blockage in the lungs.  Breathing in fluid, food, or other objects (aspiration).  A problem with the immune system and lung structure that is present at birth (congenital). Sometimes the cause is not known. What are the signs or symptoms? Common symptoms of this condition include:  A daily cough that brings up mucus and lasts for more than 3 weeks.  Lung infections that happen often.  Shortness of breath and wheezing.  Weakness and fatigue. How is this diagnosed? This condition is diagnosed with tests, such as:  Chest X-rays or CT scans. These are done to check for changes in the lungs.  Breathing tests. These are done to check how well your lungs are working.  A test of a sample of your saliva (sputum culture). This test is done to check for infection.  Blood tests and other tests. These are done to check for related diseases or causes. How is this treated? Treatment for this condition depends on the severity of the illness and its cause. Treatment may include:  Medicines that loosen mucus so it can be  coughed up (expectorants).  Medicines that relax the muscles of the bronchi (bronchodilators).  Antibiotic medicines to prevent or treat infection.  Physical therapy to help clear mucus from the lungs. Techniques may include: ? Postural drainage. This is when you sit or lie in certain positions so that mucus can drain by gravity. ? Chest percussion. This involves tapping the chest or back with a cupped hand. ? Chest vibration. For this therapy, a hand or special equipment vibrates your chest and back.  Surgery to remove the affected part of the lung. This may be done in severe cases. Follow these instructions at home: Medicines  Take over-the-counter and prescription medicines only as told by your health care provider.  If you were prescribed an antibiotic medicine, take it as told by your health care provider. Do not stop taking the antibiotic even if you start to feel better.  Avoid taking sedatives and antihistamines unless your health care provider tells you to take them. These medicines tend to thicken the mucus in the lungs. Managing symptoms  Perform breathing exercises or techniques to clear your lungs as told by your health care provider.  Consider using a cold steam vaporizer or humidifier in your room or home to help loosen secretions.  If you have a cough that gets worse at night, try sleeping in a semi-upright position. General instructions  Get plenty of rest.  Drink enough fluid to keep your urine clear or pale yellow.  Stay inside when pollution and ozone levels are high.  Stay up to date with vaccinations and immunizations.  Avoid cigarette smoke and other lung irritants.  Do not use any products that contain nicotine or tobacco, such as cigarettes and e-cigarettes. If you need help quitting, ask your health care provider.  Keep all follow-up visits as told by your health care provider. This is important. Contact a health care provider if:  You cough up  more sputum than before and the sputum is yellow or green in color.  You have a fever.  You cannot control your cough and are losing sleep. Get help right away if:  You cough up blood.  You have chest pain.  You have increasing shortness of breath.  You have pain that gets worse or is not controlled with medicines.  You have a fever and your symptoms suddenly get worse. Summary  Bronchiectasis is a condition in which the airways in the lungs (bronchi) are damaged and widened. The condition makes it hard for the lungs to get rid of mucus, and it causes mucus to gather in the bronchi.  Treatment usually includes therapy to help clear mucus from the lungs.  Stay up to date with vaccinations and immunizations. This information is not intended to replace advice given to you by your health care provider. Make sure you discuss any questions you have with your health care provider. Document Revised: 01/23/2017 Document Reviewed: 03/17/2016 Elsevier Patient Education  2020 [employer].

## 2020-02-02 ENCOUNTER — Ambulatory Visit: Payer: Medicare Other | Admitting: Pulmonary Disease

## 2020-04-27 ENCOUNTER — Telehealth: Payer: Self-pay

## 2020-04-27 NOTE — Telephone Encounter (Signed)
NOTES ON FILE FROM  [facility] [phone], SENT REFERRAL TO [place]

## 2020-05-03 ENCOUNTER — Encounter: Payer: Self-pay | Admitting: Podiatry

## 2020-05-03 ENCOUNTER — Ambulatory Visit (INDEPENDENT_AMBULATORY_CARE_PROVIDER_SITE_OTHER): Payer: Medicare Other | Admitting: Podiatry

## 2020-05-03 ENCOUNTER — Other Ambulatory Visit: Payer: Self-pay

## 2020-05-03 DIAGNOSIS — B351 Tinea unguium: Secondary | ICD-10-CM

## 2020-05-03 DIAGNOSIS — G629 Polyneuropathy, unspecified: Secondary | ICD-10-CM

## 2020-05-03 DIAGNOSIS — M21619 Bunion of unspecified foot: Secondary | ICD-10-CM

## 2020-05-03 MED ORDER — GABAPENTIN 300 MG PO CAPS
300.0000 mg | ORAL_CAPSULE | Freq: Every day | ORAL | 3 refills | Status: DC
Start: 2020-05-03 — End: 2021-04-29

## 2020-05-03 NOTE — Progress Notes (Signed)
Subjective:   Patient ID: Brooke Jimenez, female   DOB: 75 y.o.   MRN: [id number]   HPI Patient presents stating she does get some swelling of her left ankle and has a red spot on her left lower leg and does have structural bunion deformity left over right and is concerned about possible neuropathic-like symptoms with burning pain mostly at nighttime   ROS      Objective:  Physical Exam  Neurovascular status intact with patient found to have nails that are incurvated bilateral and she has trouble cutting along with patient noted to have what appears to be neuropathic-like symptomatology bilateral with complaints about burning pain at night varicosities of the ankle and foot left over right with a possible irritation left lower leg and structural bunion deformity left over right painful     Assessment:  Significant conditions with possibility for neuropathic condition along with 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  At this point I did debride nailbeds 1-5 both feet discussed the neurological condition and we are going to start her on gabapentin at night to see if this makes a difference and I went ahead today and discussed the bunion correction which could be done but I like her to get her veins evaluated first and I am sending her to [facility].  Encouraged her to call with questions

## 2020-05-04 ENCOUNTER — Telehealth: Payer: Self-pay | Admitting: Podiatry

## 2020-05-04 NOTE — Telephone Encounter (Signed)
Patient called in requesting information for vein specialists and ophthalmologists, stated you had given information at last visit but she lost it unfortunately Please Advise

## 2020-05-05 NOTE — Progress Notes (Signed)
CARDIOLOGY CONSULT NOTE       Patient ID: Brooke Jimenez MRN: [id number] DOB/AGE: Mar 12, 1945 75 y.o.  Admit date: (Not on file) Referring Physician: [name] Primary Physician: Karleen Hampshire., MD Primary Cardiologist: New Reason for Consultation: Mitral Valve Disease  Active Problems:   * No active hospital problems. *   HPI:  75 y.o. with Bronchiectasis followed by Dr Hartford Poli.  Referred by Dr [name] for mitral valve disease Her last office note indicates no murmur and there are no recent echo's to review She has had a lot of GI consults for silent aspiration GERD IBS and dysmotility No history of rheumatic disease, Phen-Fen use or SBE   She has been more sedentary since COVID and gained weight Has lung specialist [facility] No fever, sputum chest pain , edema, palpitations or syndope  Activity also limited by left hip pain Has had replacement by Alusio and due to see him again    12/2019 CXR 01/12/20 NAD  She is vaccinated for COVID   Continues to have somatic complaints with bunions, LE varicosities   ROS All other systems reviewed and negative except as noted above  Past Medical History:  Diagnosis Date  . Acne rosacea   . Anxiety   . Arthritis    arthritis in hip   . Bronchiectasis ([place])   . Cataract    beginning  . Complication of anesthesia    "really sore throat"  . Corneal dystrophy   . DDD (degenerative disc disease), cervical    and lower back  . Diverticulosis   . Dysphagia    chronic- please see ultrasound done 4/16 15 in [facility]   . Dysrhythmia    sometimes patient has extra beats per patient   . Esophageal dysmotility   . Family history of adverse reaction to anesthesia    mother slow to wake up   . GERD (gastroesophageal reflux disease)    under control  . Glaucoma    "suspect"  . Gout    one finger x1 attack  . H/O hiatal hernia   . 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 wears hearing aides  . Heart murmur    no problem   . History of chicken  pox   . History of melanoma    melanoma- 1986 and 1997   . Hypothyroidism   . Low back pain with sciatica   . Measles    hx of  . Mitral valve prolapse   . Nocturia   . Osteoporosis   . Peripheral neuropathy    feet  . PONV (postoperative nausea and vomiting)   . PTSD (post-traumatic stress disorder) 2006  . RSD (reflex sympathetic dystrophy) 1993  . Spider veins   . Urinary incontinence    occasional  . Varicose veins     Family History  Problem Relation Age of Onset  . Cancer Mother   . Hypothyroidism Mother   . Congestive Heart Failure Mother   . Hypertension Mother   . Heart failure Father     Social History   Socioeconomic History  . Marital status: Married    Spouse name: Not on file  . Number of children: Not on file  . Years of education: Not on file  . Highest education level: Not on file  Occupational History  . Not on file  Tobacco Use  . Smoking status: Never Smoker  . Smokeless tobacco: Never Used  Substance and Sexual Activity  . Alcohol use:  No    Alcohol/week: 0.0 standard drinks  . Drug use: No  . Sexual activity: Not on file  Other Topics Concern  . Not on file  Social History Narrative  . Not on file   Social Determinants of Health   Financial Resource Strain: Not on file  Food Insecurity: Not on file  Transportation Needs: Not on file  Physical Activity: Not on file  Stress: Not on file  Social Connections: Not on file  Intimate Partner Violence: Not on file    Past Surgical History:  Procedure Laterality Date  . ESOPHAGEAL MANOMETRY N/A 12/17/2015   Procedure: ESOPHAGEAL MANOMETRY (EM);  Surgeon: Mauri Pole, MD;  Location: [facility];  Service: Endoscopy;  Laterality: N/A;  . HARDWARE REMOVAL Left 10/17/2013   Procedure: HARDWARE REMOVAL LEFT HIP;  Surgeon: Gearlean Alf, MD;  Location: [facility];  Service: Orthopedics;  Laterality: Left;  . HIP FRACTURE SURGERY Left 2006   3 screws  . INSERTION OF MESH  02/08/2014    Procedure: INSERTION OF MESH;  Surgeon: Jackolyn Confer, MD;  Location: [facility];  Service: General;;  . melanoma surgery   1986, 1997  . PARATHYROIDECTOMY  04/02/15  . [place] IMPEDANCE STUDY N/A 12/17/2015   Procedure: [place] IMPEDANCE STUDY;  Surgeon: Mauri Pole, MD;  [facility];  Service: Endoscopy;  Laterality: N/A;  . TOTAL HIP ARTHROPLASTY Left 04/10/2014   Procedure: LEFT TOTAL HIP ARTHROPLASTY ANTERIOR APPROACH;  Surgeon: Gearlean Alf, MD;  [facility]: [facility];  Service: Orthopedics;  Laterality: Left;  . UMBILICAL HERNIA REPAIR N/A 02/08/2014   Procedure: HERNIA REPAIR UMBILICAL ADULT WITH MESH;  Surgeon: Jackolyn Confer, MD;  Location: [facility];  Service: General;  Laterality: N/A;      Current Outpatient Medications:  .  calcium carbonate (TUMS - DOSED IN MG ELEMENTAL CALCIUM) 500 MG chewable tablet, Chew 3 tablets by mouth daily as needed for indigestion or heartburn. , Disp: , Rfl:  .  clindamycin (CLEOCIN) 150 MG capsule, Take 600 mg by mouth. One hour before dental appointments, Disp: , Rfl:  .  doxycycline (VIBRA-TABS) 100 MG tablet, Take 1 tablet (100 mg total) by mouth 2 (two) times daily., Disp: 20 tablet, Rfl: 0 .  gabapentin (NEURONTIN) 300 MG capsule, Take 1 capsule (300 mg total) by mouth at bedtime., Disp: 90 capsule, Rfl: 3 .  guaiFENesin (MUCINEX) 600 MG 12 hr tablet, Take 1 tablet (600 mg total) by mouth 2 (two) times daily., Disp: 60 tablet, Rfl: 0 .  levothyroxine (SYNTHROID, LEVOTHROID) 75 MCG tablet, Take 75 mcg by mouth daily before breakfast., Disp: , Rfl:  .  Multiple Vitamin (MULTIVITAMIN WITH MINERALS) TABS tablet, Take 1 tablet by mouth daily., Disp: , Rfl:  .  sodium chloride (MURO 128) 5 % ophthalmic ointment, Muro 128 5 % eye ointment, Disp: , Rfl:     Physical Exam: Blood pressure 126/80, pulse 85, height 5\' 5"  (1.651 m), weight 81.6 kg, SpO2 98 %.    Affect appropriate Healthy:  appears stated age 54: normal Neck supple with no  adenopathy JVP normal no bruits no thyromegaly Lungs clear with no wheezing and good diaphragmatic motion Heart:  S1/S2 no murmur, no rub, gallop or click PMI normal Abdomen: benighn, BS positve, no tenderness, no AAA no bruit.  No HSM or HJR Distal pulses intact with no bruits No edema Neuro non-focal Skin warm and dry No muscular weakness   Labs:   Lab Results  Component Value Date  WBC 6.3 10/13/2016   HGB 14.1 10/13/2016   HCT 41.2 10/13/2016   MCV 88.2 10/13/2016   PLT 219 10/13/2016      Radiology: No results found.    EKG: SR rate 89 normal    ASSESSMENT AND PLAN:   1. Bronchiectasis:  F/u pulmonary Dr Wilnette Kales  ? Related to her GI issues and silent aspiration 2.  GI:  GERD/IBS f/u with GI has been seen at [facility] and [facility] 3. Valve Dx:  Historical diagnosis TTE done 09/16/19 with only mild AR and no MV dx   4. Thyroid:  On synthroid replacement TSH normal  5. Dyspnea:  Likely pulmonary made worse by weight gain and inactivity Echo normal EF and no significant valve disease normal estimated PA pressures  Myovue 09/16/19 normal with EF 65%   F/U with cardiology PRN   Signed: Jenkins Rouge 05/17/2020, 10:42 AM

## 2020-05-05 NOTE — Progress Notes (Incomplete)
CARDIOLOGY CONSULT NOTE       Patient ID: Brooke Jimenez MRN: [id number] DOB/AGE: Jun 30, 1945 75 y.o.  Admit date: (Not on file) Referring Physician: [name] Primary Physician: Karleen Hampshire., MD Primary Cardiologist: New Reason for Consultation: Mitral Valve Disease  Active Problems:   * No active hospital problems. *   HPI:  75 y.o. with Bronchiectasis followed by Dr Hartford Poli.  Referred by Dr [name] for mitral valve disease Her last office note indicates no murmur and there are no recent echo's to review She has had a lot of GI consults for silent aspiration GERD IBS and dysmotility No history of rheumatic disease, Phen-Fen use or SBE   She has been more sedentary since COVID and gained weight Has lung specialist at [facility] No fever, sputum chest pain , edema, palpitations or syndope  Activity also limited by left hip pain Has had replacement by Al[facility]io and due to see him again    12/2019 CXR 01/12/20 NAD  She is vaccinated for COVID   ***  ROS All other systems reviewed and negative except as noted above  Past Medical History:  Diagnosis Date  . Acne rosacea   . Anxiety   . Arthritis    arthritis in hip   . Bronchiectasis ([place])   . Cataract    beginning  . Complication of anesthesia    "really sore throat"  . Corneal dystrophy   . DDD (degenerative disc disease), cervical    and lower back  . Diverticulosis   . Dysphagia    chronic- please see ultrasound done 4/16 15 in [facility]   . Dysrhythmia    sometimes patient has extra beats per patient   . Esophageal dysmotility   . Family history of adverse reaction to anesthesia    mother slow to wake up   . GERD (gastroesophageal reflux disease)    under control  . Glaucoma    "suspect"  . Gout    one finger x1 attack  . H/O hiatal hernia   . 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 wears hearing aides  . Heart murmur    no problem   . History of chicken pox   . History of melanoma    melanoma- 1986 and 1997   .  Hypothyroidism   . Low back pain with sciatica   . Measles    hx of  . Mitral valve prolapse   . Nocturia   . Osteoporosis   . Peripheral neuropathy    feet  . PONV (postoperative nausea and vomiting)   . PTSD (post-traumatic stress disorder) 2006  . RSD (reflex sympathetic dystrophy) 1993  . Spider veins   . Urinary incontinence    occasional  . Varicose veins     Family History  Problem Relation Age of Onset  . Cancer Mother   . Hypothyroidism Mother   . Congestive Heart Failure Mother   . Hypertension Mother   . Heart failure Father     Social History   Socioeconomic History  . Marital status: Married    Spouse name: Not on file  . Number of children: Not on file  . Years of education: Not on file  . Highest education level: Not on file  Occupational History  . Not on file  Tobacco Use  . Smoking status: Never Smoker  . Smokeless tobacco: Never Used  Substance and Sexual Activity  . Alcohol use: No    Alcohol/week: 0.0 standard drinks  .  Drug use: No  . Sexual activity: Not on file  Other Topics Concern  . Not on file  Social History Narrative  . Not on file   Social Determinants of Health   Financial Resource Strain: Not on file  Food Insecurity: Not on file  Transportation Needs: Not on file  Physical Activity: Not on file  Stress: Not on file  Social Connections: Not on file  Intimate Partner Violence: Not on file    Past Surgical History:  Procedure Laterality Date  . ESOPHAGEAL MANOMETRY N/A 12/17/2015   Procedure: ESOPHAGEAL MANOMETRY (EM);  Surgeon: Mauri Pole, MD;  [facility];  Service: Endoscopy;  Laterality: N/A;  . HARDWARE REMOVAL Left 10/17/2013   Procedure: HARDWARE REMOVAL LEFT HIP;  Surgeon: Gearlean Alf, MD;  [facility];  Service: Orthopedics;  Laterality: Left;  . HIP FRACTURE SURGERY Left 2006   3 screws  . INSERTION OF MESH  02/08/2014   Procedure: INSERTION OF MESH;  Surgeon: Jackolyn Confer, MD;   [facility];  Service: General;;  . melanoma surgery   1986, 1997  . PARATHYROIDECTOMY  04/02/15  . [place] IMPEDANCE STUDY N/A 12/17/2015   Procedure: [place] IMPEDANCE STUDY;  Surgeon: Mauri Pole, MD;  [facility];  Service: Endoscopy;  Laterality: N/A;  . TOTAL HIP ARTHROPLASTY Left 04/10/2014   Procedure: LEFT TOTAL HIP ARTHROPLASTY ANTERIOR APPROACH;  Surgeon: Gearlean Alf, MD;  [facility];  Service: Orthopedics;  Laterality: Left;  . UMBILICAL HERNIA REPAIR N/A 02/08/2014   Procedure: HERNIA REPAIR UMBILICAL ADULT WITH MESH;  Surgeon: Jackolyn Confer, MD;  [facility];  Service: General;  Laterality: N/A;      Current Outpatient Medications:  .  calcium carbonate (TUMS - DOSED IN MG ELEMENTAL CALCIUM) 500 MG chewable tablet, Chew 3 tablets by mouth daily as needed for indigestion or heartburn. , Disp: , Rfl:  .  clindamycin (CLEOCIN) 150 MG capsule, Take 600 mg by mouth. One hour before dental appointments (Patient not taking: Reported on 01/11/2020), Disp: , Rfl:  .  doxycycline (VIBRA-TABS) 100 MG tablet, Take 1 tablet (100 mg total) by mouth 2 (two) times daily., Disp: 20 tablet, Rfl: 0 .  gabapentin (NEURONTIN) 300 MG capsule, Take 1 capsule (300 mg total) by mouth at bedtime., Disp: 90 capsule, Rfl: 3 .  guaiFENesin (MUCINEX) 600 MG 12 hr tablet, Take 1 tablet (600 mg total) by mouth 2 (two) times daily., Disp: 60 tablet, Rfl: 0 .  levothyroxine (SYNTHROID, LEVOTHROID) 75 MCG tablet, Take 75 mcg by mouth daily before breakfast., Disp: , Rfl:  .  Multiple Vitamin (MULTIVITAMIN WITH MINERALS) TABS tablet, Take 1 tablet by mouth daily., Disp: , Rfl:  .  sodium chloride (MURO 128) 5 % ophthalmic ointment, Muro 128 5 % eye ointment, Disp: , Rfl:     Physical Exam: There were no vitals taken for this visit.    Affect appropriate Healthy:  appears stated age 53: normal Neck supple with no adenopathy JVP normal no bruits no thyromegaly Lungs clear with  no wheezing and good diaphragmatic motion Heart:  S1/S2 no murmur, no rub, gallop or click PMI normal Abdomen: benighn, BS positve, no tenderness, no AAA no bruit.  No HSM or HJR Distal pulses intact with no bruits No edema Neuro non-focal Skin warm and dry No muscular weakness   Labs:   Lab Results  Component Value Date   WBC 6.3 10/13/2016   HGB 14.1 10/13/2016   HCT  41.2 10/13/2016   MCV 88.2 10/13/2016   PLT 219 10/13/2016      Radiology: No results found.  EKG: SR rate 89 normal    ASSESSMENT AND PLAN:   1. Bronchiectasis:  F/u pulmonary Dr Wilnette Kales  ? Related to her GI issues and silent aspiration 2.  GI:  GERD/IBS f/u with GI has been seen at both [facility] and [facility] 3. Valve Dx:  Historical diagnosis TTE done 09/16/19 with only mild AR and no MV dx   4. Thyroid:  On synthroid replacement TSH normal  5. Dyspnea:  Likely pulmonary made worse by weight gain and inactivity Echo normal EF and no significant valve disease normal estimated PA pressures  Myovue 09/16/19 normal with EF 65%   F/U with cardiology PRN   Signed: Jenkins Rouge 05/05/2020, 11:52 PM

## 2020-05-06 NOTE — Telephone Encounter (Signed)
Dr.lyles opthatmologist and [facility]

## 2020-05-08 ENCOUNTER — Telehealth: Payer: Self-pay | Admitting: Podiatry

## 2020-05-08 NOTE — Telephone Encounter (Signed)
Patient has requested referral for the following vein specialists you recommended, Please Advise

## 2020-05-08 NOTE — Telephone Encounter (Signed)
Informed patient, Thanks

## 2020-05-09 NOTE — Telephone Encounter (Signed)
You are able to call them directly. Do not require referral

## 2020-05-09 NOTE — Telephone Encounter (Signed)
Called patient to let her know she could call the specialists to be scheduled and patient stated "Wonder why he doesn't want to send referral" then hung up

## 2020-05-17 ENCOUNTER — Ambulatory Visit (INDEPENDENT_AMBULATORY_CARE_PROVIDER_SITE_OTHER): Payer: Medicare Other | Admitting: Cardiovascular Disease

## 2020-05-17 ENCOUNTER — Encounter: Payer: Self-pay | Admitting: Cardiovascular Disease

## 2020-05-17 ENCOUNTER — Other Ambulatory Visit: Payer: Self-pay

## 2020-05-17 VITALS — BP 126/80 | HR 85 | Ht 65.0 in | Wt 180.0 lb

## 2020-05-17 DIAGNOSIS — R0600 Dyspnea, unspecified: Secondary | ICD-10-CM

## 2020-05-17 DIAGNOSIS — R0609 Other forms of dyspnea: Secondary | ICD-10-CM

## 2020-05-17 NOTE — Patient Instructions (Addendum)
Medication Instructions:  *If you need a refill on your cardiac medications before your next appointment, please call your pharmacy*  Lab Work: If you have labs (blood work) drawn today and your tests are completely normal, you will receive your results only by: Marland Kitchen MyChart Message (if you have MyChart) OR . A paper copy in the mail If you have any lab test that is abnormal or we need to change your treatment, we will call you to review the results.  Testing/Procedures: None ordered today.  Follow-Up: At [facility], you and your health needs are our priority.  As part of our continuing mission to provide you with exceptional heart care, we have created designated Provider Care Teams.  These Care Teams include your primary Cardiologist (physician) and Advanced Practice Providers (APPs -  Physician Assistants and Nurse Practitioners) who all work together to provide you with the care you need, when you need it.  We recommend signing up for the patient portal called "MyChart".  Sign up information is provided on this After Visit Summary.  MyChart is used to connect with patients for Virtual Visits (Telemedicine).  Patients are able to view lab/test results, encounter notes, upcoming appointments, etc.  Non-urgent messages can be sent to your provider as well.   To learn more about what you can do with MyChart, go to [email or web address].    Your next appointment:   As needed  The format for your next appointment:   In Person  Provider:   You may see Dr. Johnsie Cancel or one of the following Advanced Practice Providers on your designated Care Team:    Kathyrn Drown, NP

## 2020-06-06 ENCOUNTER — Ambulatory Visit: Payer: Medicare Other | Admitting: Podiatry

## 2020-09-25 ENCOUNTER — Encounter: Payer: Self-pay | Admitting: Physical Therapy

## 2020-09-25 ENCOUNTER — Ambulatory Visit: Payer: Medicare Other | Attending: Orthopedic Surgery | Admitting: Physical Therapy

## 2020-09-25 ENCOUNTER — Other Ambulatory Visit: Payer: Self-pay

## 2020-09-25 DIAGNOSIS — M25552 Pain in left hip: Secondary | ICD-10-CM | POA: Insufficient documentation

## 2020-09-25 DIAGNOSIS — R262 Difficulty in walking, not elsewhere classified: Secondary | ICD-10-CM | POA: Insufficient documentation

## 2020-09-25 DIAGNOSIS — M6281 Muscle weakness (generalized): Secondary | ICD-10-CM | POA: Insufficient documentation

## 2020-09-25 NOTE — Therapy (Signed)
[place]. [place], [place], [address] Phone: [phone]   Fax:  [phone]  Physical Therapy Evaluation  Patient Details  Name: Brooke Jimenez MRN: [id number] Date of Birth: 08-01-1945 Referring Provider (PT): Aluisio   Encounter Date: 09/25/2020   PT End of Session - 09/25/20 1546     Visit Number 1    Date for PT Re-Evaluation 12/26/20    Authorization Type Medicare    PT Start Time 1453    PT Stop Time 1545    PT Time Calculation (min) 52 min    Activity Tolerance Patient limited by pain    Behavior During Therapy Agitated             Past Medical History:  Diagnosis Date   Acne rosacea    Anxiety    Arthritis    arthritis in hip    Bronchiectasis ([place])    Cataract    beginning   Complication of anesthesia    "really sore throat"   Corneal dystrophy    DDD (degenerative disc disease), cervical    and lower back   Diverticulosis    Dysphagia    chronic- please see ultrasound done 4/16 15 in [facility]    Dysrhythmia    sometimes patient has extra beats per patient    Esophageal dysmotility    Family history of adverse reaction to anesthesia    mother slow to wake up    GERD (gastroesophageal reflux disease)    under control   Glaucoma    "suspect"   Gout    one finger x1 attack   H/O hiatal hernia    Hearing loss    bilateral due to nerve loss. wears hearing aides   Heart murmur    no problem    History of chicken pox    History of melanoma    melanoma- 1986 and 1997    Hypothyroidism    Low back pain with sciatica    Measles    hx of   Mitral valve prolapse    Nocturia    Osteoporosis    Peripheral neuropathy    feet   PONV (postoperative nausea and vomiting)    PTSD (post-traumatic stress disorder) 2006   RSD (reflex sympathetic dystrophy) 1993   Spider veins    Urinary incontinence    occasional   Varicose veins     Past Surgical History:  Procedure Laterality Date    ESOPHAGEAL MANOMETRY N/A 12/17/2015   Procedure: ESOPHAGEAL MANOMETRY (EM);  Surgeon: Mauri Pole, MD;  [facility];  Service: Endoscopy;  Laterality: N/A;   HARDWARE REMOVAL Left 10/17/2013   Procedure: HARDWARE REMOVAL LEFT HIP;  Surgeon: Gearlean Alf, MD;  [facility];  Service: Orthopedics;  Laterality: Left;   HIP FRACTURE SURGERY Left 2006   3 screws   INSERTION OF MESH  02/08/2014   Procedure: INSERTION OF MESH;  Surgeon: Jackolyn Confer, MD;  [facility];  Service: General;;   melanoma surgery   1986, 1997   PARATHYROIDECTOMY  04/02/15   [place] IMPEDANCE STUDY N/A 12/17/2015   Procedure: [place] IMPEDANCE STUDY;  Surgeon: Mauri Pole, MD;  [facility];  Service: Endoscopy;  Laterality: N/A;   TOTAL HIP ARTHROPLASTY Left 04/10/2014   Procedure: LEFT TOTAL HIP ARTHROPLASTY ANTERIOR APPROACH;  Surgeon: Gearlean Alf, MD;  [facility];  Service: Orthopedics;  Laterality: Left;   UMBILICAL HERNIA REPAIR N/A 02/08/2014  Procedure: HERNIA REPAIR UMBILICAL ADULT WITH MESH;  Surgeon: Jackolyn Confer, MD;  [facility];  Service: General;  Laterality: N/A;    There were no vitals filed for this visit.    Subjective Assessment - 09/25/20 1459     Subjective Patient reports that about 2 weeks ago she started having left hip and buttock pain.  She is unaware of what the cause is.  She reports that x-rays were negative.  Possible bursitis, vs joint issue, but MD feels like the hip joint is good.    Limitations Sitting;Standing;Walking;House hold activities    How long can you walk comfortably? started using a FWW last week due to the pain    Patient Stated Goals have less pain    Currently in Pain? Yes    Pain Score 8     Pain Location Buttocks    Pain Orientation Left    Pain Descriptors / Indicators Aching    Pain Type Acute pain    Pain Radiating Towards denies    Aggravating Factors  sitting, lying down, walking, standing pain up to  10/10    Pain Relieving Factors reports nothing really helps, pain is 7/10 at all times    Effect of Pain on Daily Activities limits everything                [facility] PT Assessment - 09/25/20 0001       Assessment   Medical Diagnosis left hip buttock pain    Referring Provider (PT) Aluisio    Onset Date/Surgical Date 09/18/20    Prior Therapy no      Balance Screen   Has the patient fallen in the past 6 months No    Has the patient had a decrease in activity level because of a fear of falling?  No    Is the patient reluctant to leave their home because of a fear of falling?  No      Home Environment   Additional Comments has stairs, does the houseork and cooking      Prior Function   Level of Independence Independent    Vocation Retired    Leisure no exercise      ROM / Strength   AROM / PROM / Strength AROM;Strength      AROM   Overall AROM Comments left hip flexion 90 degrees, abduction 10 degress, extension 5 degrees all with some left buttock pain      Strength   Overall Strength Comments left hip 3+/5, right hip 4-/5 with pain in the left for both tests      Palpation   Palpation comment very tender in the left buttock over the piriformis very deep, the GT area is tender mildly      Ambulation/Gait   Gait Comments uses a FWW, slow, step to gait, limp on the left      Standardized Balance Assessment   Standardized Balance Assessment Timed Up and Go Test      Timed Up and Go Test   Normal TUG (seconds) 40    TUG Comments with FWW                        Objective measurements completed on examination: See above findings.       [facility] Adult PT Treatment/Exercise - 09/25/20 0001       Modalities   Modalities Energy manager  left buttock    Electrical Stimulation Action IFC    Electrical Stimulation Parameters sitting    Electrical Stimulation Goals Pain                       PT Short Term Goals - 09/25/20 1557       PT SHORT TERM GOAL #1   Title independent with HEP    Time 2    Period Weeks    Status Achieved               PT Long Term Goals - 09/25/20 1557       PT LONG TERM GOAL #1   Title go up and down stairs step over step consistently    Time 8    Period Weeks    Status New      PT LONG TERM GOAL #2   Title return to independent gym program    Time 8    Period Weeks    Status New      PT LONG TERM GOAL #3   Title walk without device without deviation    Time 12    Period Weeks    Status New      PT LONG TERM GOAL #4   Title report pain decreased 50%    Time 8    Period Weeks    Status New                    Plan - 09/25/20 1549     Clinical Impression Statement Patient reports that two weeks ago she started having left buttock pain, this is the side she has a THA on.  She saw the orthopod and they did x-rays  they felt it was not bursitis, that it was deep in the buttock or joint, she reports that he could not rule out a stress fracture.  She reports that last week she had to start using a walker, her normal is no device, she is very slow and antalgic on the left with a step to patter, she is tender along the SI area and in the left buttock over the piriformis mms.  Most tests that I did irritated the pain, she was weak.  I tried estim to help her pain but this also irritated the pain    Stability/Clinical Decision Making Evolving/Moderate complexity    Clinical Decision Making Low    Rehab Potential Good    PT Frequency 2x / week    PT Duration 12 weeks    PT Treatment/Interventions ADLs/Self Care Home Management;Cryotherapy;Electrical Stimulation;Iontophoresis '4mg'$ /ml Dexamethasone;Moist Heat;Gait training;Stair training;Functional mobility training;Therapeutic activities;Therapeutic exercise;Balance training;Neuromuscular re-education;Manual techniques;Patient/family education    PT Next  Visit Plan see if we can get her moving better, decrease the pain, SI issue?    Consulted and Agree with Plan of Care Patient             Patient will benefit from skilled therapeutic intervention in order to improve the following deficits and impairments:  Difficulty walking, Abnormal gait, Cardiopulmonary status limiting activity, Decreased endurance, Pain, Decreased activity tolerance, Decreased balance, Impaired flexibility, Decreased strength  Visit Diagnosis: Pain in left hip - Plan: PT plan of care cert/re-cert  Muscle weakness (generalized) - Plan: PT plan of care cert/re-cert  Difficulty in walking, not elsewhere classified - Plan: PT plan of care cert/re-cert     Problem List Patient Active Problem List   Diagnosis Date Noted   Upper respiratory  infection 01/15/2018   Postherpetic neuralgia 12/25/2016   Dysesthesia 12/25/2016   Polyneuropathy 12/25/2016   Exposure to influenza 04/01/2016   Dysphagia    Bronchiectasis without acute exacerbation ([place]) 08/30/2015   Bronchiectasis ([place]) 08/14/2015   Chronic rhinitis 08/14/2015   Avascular necrosis of femur head, left ([place]) 04/10/2014   OA (osteoarthritis) of hip 04/10/2014   Painful orthopaedic hardware ([place]) 10/17/2013    Sumner Boast., PT 09/25/2020, 4:00 PM  [place]. [place], [place], [address] Phone: [phone]   Fax:  [phone]  Name: Brooke Jimenez MRN: [id number] Date of Birth: 01-01-1946

## 2020-09-25 NOTE — Patient Instructions (Signed)
Access Code: [email or web address] URL: [email or web address] Date: 09/25/2020 Prepared by: Lum Babe  Exercises Supine Piriformis Stretch Pulling Heel to Hip - 3 x daily - 7 x weekly - 1 sets - 5 reps - 20 hold

## 2020-10-02 ENCOUNTER — Other Ambulatory Visit: Payer: Self-pay

## 2020-10-02 ENCOUNTER — Ambulatory Visit: Payer: Medicare Other | Admitting: Physical Therapy

## 2020-10-02 DIAGNOSIS — M6281 Muscle weakness (generalized): Secondary | ICD-10-CM

## 2020-10-02 DIAGNOSIS — M25552 Pain in left hip: Secondary | ICD-10-CM

## 2020-10-02 NOTE — Therapy (Signed)
[place]. [place], [place], [address] Phone: [phone]   Fax:  [phone]  Physical Therapy Treatment  Patient Details  Name: Brooke Jimenez MRN: [id number] Date of Birth: [birth year] Referring Provider (PT): Aluisio   Encounter Date: 10/02/2020   PT End of Session - 10/02/20 1141     Visit Number 2    Date for PT Re-Evaluation 12/26/20    Authorization Type Medicare    PT Start Time 1100    PT Stop Time 1142    PT Time Calculation (min) 42 min             Past Medical History:  Diagnosis Date   Acne rosacea    Anxiety    Arthritis    arthritis in hip    Bronchiectasis ([place])    Cataract    beginning   Complication of anesthesia    "really sore throat"   Corneal dystrophy    DDD (degenerative disc disease), cervical    and lower back   Diverticulosis    Dysphagia    chronic- please see ultrasound done 4/16 15 in EPIC    Dysrhythmia    sometimes patient has extra beats per patient    Esophageal dysmotility    Family history of adverse reaction to anesthesia    mother slow to wake up    GERD (gastroesophageal reflux disease)    under control   Glaucoma    "suspect"   Gout    one finger x1 attack   H/O hiatal hernia    Hearing loss    bilateral due to nerve loss. wears hearing aides   Heart murmur    no problem    History of chicken pox    History of melanoma    melanoma- 1986 and 1997    Hypothyroidism    Low back pain with sciatica    Measles    hx of   Mitral valve prolapse    Nocturia    Osteoporosis    Peripheral neuropathy    feet   PONV (postoperative nausea and vomiting)    PTSD (post-traumatic stress disorder) 2006   RSD (reflex sympathetic dystrophy) 1993   Spider veins    Urinary incontinence    occasional   Varicose veins     Past Surgical History:  Procedure Laterality Date   ESOPHAGEAL MANOMETRY N/A 12/17/2015   Procedure: ESOPHAGEAL MANOMETRY (EM);   Surgeon: Mauri Pole, MD;  [facility];  Service: Endoscopy;  Laterality: N/A;   HARDWARE REMOVAL Left 10/17/2013   Procedure: HARDWARE REMOVAL LEFT HIP;  Surgeon: Gearlean Alf, MD;  [facility];  Service: Orthopedics;  Laterality: Left;   HIP FRACTURE SURGERY Left 2006   3 screws   INSERTION OF MESH  02/08/2014   Procedure: INSERTION OF MESH;  Surgeon: Jackolyn Confer, MD;  [facility];  Service: General;;   melanoma surgery   1986, 1997   PARATHYROIDECTOMY  04/02/15   [place] IMPEDANCE STUDY N/A 12/17/2015   Procedure: [place] IMPEDANCE STUDY;  Surgeon: Mauri Pole, MD;  [facility];  Service: Endoscopy;  Laterality: N/A;   TOTAL HIP ARTHROPLASTY Left 04/10/2014   Procedure: LEFT TOTAL HIP ARTHROPLASTY ANTERIOR APPROACH;  Surgeon: Gearlean Alf, MD;  [facility];  Service: Orthopedics;  Laterality: Left;   UMBILICAL HERNIA REPAIR N/A 02/08/2014   Procedure: HERNIA REPAIR UMBILICAL ADULT WITH MESH;  Surgeon: Jackolyn Confer, MD;  [facility];  Service: General;  Laterality: N/A;    There were no vitals filed for this visit.   Subjective Assessment - 10/02/20 1101     Subjective took advil so some better, not as sharp    Currently in Pain? Yes    Pain Score 5     Pain Location Buttocks    Pain Orientation Left    Pain Descriptors / Indicators Dull                               [facility] Adult PT Treatment/Exercise - 10/02/20 0001       Exercises   Exercises Lumbar;Knee/Hip      Lumbar Exercises: Seated   Other Seated Lumbar Exercises pelvic ROM 10 x 4 way on dyna disc   LAQ and hip flex on dyna disc 10 each     Lumbar Exercises: Supine   Clam 20 reps   red tband   Clam Limitations Dl 10x, SL 10 x    Bent Knee Raise 20 reps   red tband- painful more with lifting RLE   Bridge Compliant;5 reps    Other Supine Lumbar Exercises ball squeeze 10 x 3 sec      Manual Therapy   Manual Therapy Muscle Energy Technique     Muscle Energy Technique left post rotation                    PT Education - 10/02/20 1141     Education Details [id number]    Person(s) Educated Patient    Methods Explanation;Demonstration    Comprehension Verbalized understanding;Returned demonstration              PT Short Term Goals - 09/25/20 1557       PT SHORT TERM GOAL #1   Title independent with HEP    Time 2    Period Weeks    Status Achieved               PT Long Term Goals - 09/25/20 1557       PT LONG TERM GOAL #1   Title go up and down stairs step over step consistently    Time 8    Period Weeks    Status New      PT LONG TERM GOAL #2   Title return to independent gym program    Time 8    Period Weeks    Status New      PT LONG TERM GOAL #3   Title walk without device without deviation    Time 12    Period Weeks    Status New      PT LONG TERM GOAL #4   Title report pain decreased 50%    Time 8    Period Weeks    Status New                   Plan - 10/02/20 1142     Clinical Impression Statement pt with left posterior rotation that responded well to MET, issued for HEP as well as SI stab ex. explained in length SI stab ex and decreasing stress on joint. pt to check with pulm MD re: ionto    PT Treatment/Interventions ADLs/Self Care Home Management;Cryotherapy;Electrical Stimulation;Iontophoresis 63m/ml Dexamethasone;Moist Heat;Gait training;Stair training;Functional mobility training;Therapeutic activities;Therapeutic exercise;Balance training;Neuromuscular re-education;Manual techniques;Patient/family education    PT Next Visit Plan check on ionto,SI stab  Patient will benefit from skilled therapeutic intervention in order to improve the following deficits and impairments:  Difficulty walking, Abnormal gait, Cardiopulmonary status limiting activity, Decreased endurance, Pain, Decreased activity tolerance, Decreased balance, Impaired flexibility,  Decreased strength  Visit Diagnosis: Muscle weakness (generalized)  Pain in left hip     Problem List Patient Active Problem List   Diagnosis Date Noted   Upper respiratory infection 01/15/2018   Postherpetic neuralgia 12/25/2016   Dysesthesia 12/25/2016   Polyneuropathy 12/25/2016   Exposure to influenza 04/01/2016   Dysphagia    Bronchiectasis without acute exacerbation ([place]) 08/30/2015   Bronchiectasis ([place]) 08/14/2015   Chronic rhinitis 08/14/2015   Avascular necrosis of femur head, left ([place]) 04/10/2014   OA (osteoarthritis) of hip 04/10/2014   Painful orthopaedic hardware ([place]) 10/17/2013    Keshawn Fiorito,ANGIE PTA 10/02/2020, 11:44 AM  [place]. [place], [place], [address] Phone: [phone]   Fax:  [phone]  Name: Brooke Jimenez MRN: [id number] Date of Birth: 1946/02/01

## 2020-10-04 ENCOUNTER — Other Ambulatory Visit: Payer: Self-pay

## 2020-10-04 ENCOUNTER — Encounter: Payer: Self-pay | Admitting: Physical Therapy

## 2020-10-04 ENCOUNTER — Ambulatory Visit: Payer: Medicare Other | Admitting: Physical Therapy

## 2020-10-04 DIAGNOSIS — R262 Difficulty in walking, not elsewhere classified: Secondary | ICD-10-CM

## 2020-10-04 DIAGNOSIS — M25552 Pain in left hip: Secondary | ICD-10-CM

## 2020-10-04 DIAGNOSIS — M6281 Muscle weakness (generalized): Secondary | ICD-10-CM

## 2020-10-04 NOTE — Patient Instructions (Signed)

## 2020-10-04 NOTE — Therapy (Signed)
[place]. [place], [place], [address] Phone: [phone]   Fax:  [phone]  Physical Therapy Treatment  Patient Details  Name: Brooke Jimenez MRN: [id number] Date of Birth: 06-30-1945 Referring Provider (PT): Aluisio   Encounter Date: 10/04/2020   PT End of Session - 10/04/20 1149     Visit Number 3    Date for PT Re-Evaluation 12/26/20    Authorization Type Medicare    PT Start Time 1100    PT Stop Time 1142    PT Time Calculation (min) 42 min    Activity Tolerance Patient limited by pain    Behavior During Therapy [facility] for tasks assessed/performed             Past Medical History:  Diagnosis Date   Acne rosacea    Anxiety    Arthritis    arthritis in hip    Bronchiectasis ([place])    Cataract    beginning   Complication of anesthesia    "really sore throat"   Corneal dystrophy    DDD (degenerative disc disease), cervical    and lower back   Diverticulosis    Dysphagia    chronic- please see ultrasound done 4/16 15 in EP[facility]    Dysrhythmia    sometimes patient has extra beats per patient    Esophageal dysmotility    Family history of adverse reaction to anesthesia    mother slow to wake up    GERD (gastroesophageal reflux disease)    under control   Glaucoma    "suspect"   Gout    one finger x1 attack   H/O hiatal hernia    Hearing loss    bilateral due to nerve loss. wears hearing aides   Heart murmur    no problem    History of chicken pox    History of melanoma    melanoma- 1986 and 1997    Hypothyroidism    Low back pain with sciatica    Measles    hx of   Mitral valve prolapse    Nocturia    Osteoporosis    Peripheral neuropathy    feet   PONV (postoperative nausea and vomiting)    PTSD (post-traumatic stress disorder) 2006   RSD (reflex sympathetic dystrophy) 1993   Spider veins    Urinary incontinence    occasional   Varicose veins     Past Surgical History:   Procedure Laterality Date   ESOPHAGEAL MANOMETRY N/A 12/17/2015   Procedure: ESOPHAGEAL MANOMETRY (EM);  Surgeon: Mauri Pole, MD;  [facility];  Service: Endoscopy;  Laterality: N/A;   HARDWARE REMOVAL Left 10/17/2013   Procedure: HARDWARE REMOVAL LEFT HIP;  Surgeon: Gearlean Alf, MD;  [facility];  Service: Orthopedics;  Laterality: Left;   HIP FRACTURE SURGERY Left 2006   3 screws   INSERTION OF MESH  02/08/2014   Procedure: INSERTION OF MESH;  Surgeon: Jackolyn Confer, MD;  [facility];  Service: General;;   melanoma surgery   1986, 1997   PARATHYROIDECTOMY  04/02/15   [place] IMPEDANCE STUDY N/A 12/17/2015   Procedure: [place] IMPEDANCE STUDY;  Surgeon: Mauri Pole, MD;  [facility];  Service: Endoscopy;  Laterality: N/A;   TOTAL HIP ARTHROPLASTY Left 04/10/2014   Procedure: LEFT TOTAL HIP ARTHROPLASTY ANTERIOR APPROACH;  Surgeon: Gearlean Alf, MD;  [facility];  Service: Orthopedics;  Laterality: Left;   UMBILICAL HERNIA REPAIR N/A  02/08/2014   Procedure: HERNIA REPAIR UMBILICAL ADULT WITH MESH;  Surgeon: Jackolyn Confer, MD;  [facility];  Service: General;  Laterality: N/A;    There were no vitals filed for this visit.   Subjective Assessment - 10/04/20 1113     Subjective Reports still hurting, but reports maybe a little better.    Currently in Pain? Yes    Pain Score 5     Pain Location Buttocks    Pain Orientation Left    Pain Descriptors / Indicators Aching;Dull    Aggravating Factors  activity                               [facility] Adult PT Treatment/Exercise - 10/04/20 0001       Lumbar Exercises: Seated   Long Arc Quad on Chair Both;2 sets;10 reps    LAQ on Chair Weights (lbs) 5#      Lumbar Exercises: Supine   Pelvic Tilt 10 reps;5 seconds    Pelvic Tilt Limitations tilt with hold and very small marching    Clam 20 reps;2 seconds    Clam Limitations red tband    Bent Knee Raise 20 reps     Other Supine Lumbar Exercises ball squeeze 10 x 3 sec    Other Supine Lumbar Exercises feet on ball K2C, trunk roation, small bridge moslty isometric contraction posterior chain, isometric abs      Modalities   Modalities Iontophoresis      Iontophoresis   Type of Iontophoresis Dexamethasone    Location left SI    Dose 86m dose    Time 3 hours, we decreased time as she has concerns about steroid use                      PT Short Term Goals - 09/25/20 1557       PT SHORT TERM GOAL #1   Title independent with HEP    Time 2    Period Weeks    Status Achieved               PT Long Term Goals - 09/25/20 1557       PT LONG TERM GOAL #1   Title go up and down stairs step over step consistently    Time 8    Period Weeks    Status New      PT LONG TERM GOAL #2   Title return to independent gym program    Time 8    Period Weeks    Status New      PT LONG TERM GOAL #3   Title walk without device without deviation    Time 12    Period Weeks    Status New      PT LONG TERM GOAL #4   Title report pain decreased 50%    Time 8    Period Weeks    Status New                   Plan - 10/04/20 1150     Clinical Impression Statement Patient continues to have pain in the left SI area, she reports increased pain and soreness with any motions or exercises, I used the skeleton to help demo what is possibly wrong and how the exercises can help.  She was able to do all exercises without much c/o pain until after when she reported  that she was very sore.  We used the ionto after she consulted with her pulmonologist, I advised to remove if any issues and to for sure remove at 3 hours so cutting the dose some    PT Next Visit Plan see how she is doing with the pelvic stability and see if the ionto had any benefit    Consulted and Agree with Plan of Care Patient             Patient will benefit from skilled therapeutic intervention in order to improve the  following deficits and impairments:  Difficulty walking, Abnormal gait, Cardiopulmonary status limiting activity, Decreased endurance, Pain, Decreased activity tolerance, Decreased balance, Impaired flexibility, Decreased strength  Visit Diagnosis: Muscle weakness (generalized)  Pain in left hip  Difficulty in walking, not elsewhere classified     Problem List Patient Active Problem List   Diagnosis Date Noted   Upper respiratory infection 01/15/2018   Postherpetic neuralgia 12/25/2016   Dysesthesia 12/25/2016   Polyneuropathy 12/25/2016   Exposure to influenza 04/01/2016   Dysphagia    Bronchiectasis without acute exacerbation ([place]) 08/30/2015   Bronchiectasis ([place]) 08/14/2015   Chronic rhinitis 08/14/2015   Avascular necrosis of femur head, left ([place]) 04/10/2014   OA (osteoarthritis) of hip 04/10/2014   Painful orthopaedic hardware ([place]) 10/17/2013    Sumner Boast., PT 10/04/2020, 11:55 AM  [place]. [place], [place], [address] Phone: [phone]   Fax:  [phone]  Name: Brooke Jimenez MRN: [id number] Date of Birth: 1945-08-20

## 2020-10-09 ENCOUNTER — Ambulatory Visit: Payer: Medicare Other | Admitting: Physical Therapy

## 2020-10-09 ENCOUNTER — Other Ambulatory Visit: Payer: Self-pay

## 2020-10-09 DIAGNOSIS — M25552 Pain in left hip: Secondary | ICD-10-CM | POA: Diagnosis not present

## 2020-10-09 DIAGNOSIS — R262 Difficulty in walking, not elsewhere classified: Secondary | ICD-10-CM

## 2020-10-09 DIAGNOSIS — M6281 Muscle weakness (generalized): Secondary | ICD-10-CM

## 2020-10-09 NOTE — Therapy (Signed)
[place] [place], [place], [address] Phone: [phone]   Fax:  [phone]  Physical Therapy Treatment  Patient Details  Name: Brooke Jimenez MRN: [id number] Date of Birth: Apr 12, 1945 Referring Provider (PT): Aluisio   Encounter Date: 10/09/2020   PT End of Session - 10/09/20 1224     Visit Number 4    Date for PT Re-Evaluation 12/26/20    Authorization Type Medicare    PT Start Time 1145    PT Stop Time 1230    PT Time Calculation (min) 45 min             Past Medical History:  Diagnosis Date   Acne rosacea    Anxiety    Arthritis    arthritis in hip    Bronchiectasis ([place])    Cataract    beginning   Complication of anesthesia    "really sore throat"   Corneal dystrophy    DDD (degenerative disc disease), cervical    and lower back   Diverticulosis    Dysphagia    chronic- please see ultrasound done 4/16 15 in [facility]    Dysrhythmia    sometimes patient has extra beats per patient    Esophageal dysmotility    Family history of adverse reaction to anesthesia    mother slow to wake up    GERD (gastroesophageal reflux disease)    under control   Glaucoma    "suspect"   Gout    one finger x1 attack   H/O hiatal hernia    Hearing loss    bilateral due to nerve loss. wears hearing aides   Heart murmur    no problem    History of chicken pox    History of melanoma    melanoma- 1986 and 1997    Hypothyroidism    Low back pain with sciatica    Measles    hx of   Mitral valve prolapse    Nocturia    Osteoporosis    Peripheral neuropathy    feet   PONV (postoperative nausea and vomiting)    PTSD (post-traumatic stress disorder) 2006   RSD (reflex sympathetic dystrophy) 1993   Spider veins    Urinary incontinence    occasional   Varicose veins     Past Surgical History:  Procedure Laterality Date   ESOPHAGEAL MANOMETRY N/A 12/17/2015   Procedure: ESOPHAGEAL MANOMETRY (EM);   Surgeon: Mauri Pole, MD;  [facility];  Service: Endoscopy;  Laterality: N/A;   HARDWARE REMOVAL Left 10/17/2013   Procedure: HARDWARE REMOVAL LEFT HIP;  Surgeon: Gearlean Alf, MD;  Location: [facility];  Service: Orthopedics;  Laterality: Left;   HIP FRACTURE SURGERY Left 2006   3 screws   INSERTION OF MESH  02/08/2014   Procedure: INSERTION OF MESH;  Surgeon: Jackolyn Confer, MD;  Location: [facility];  Service: General;;   melanoma surgery   1986, 1997   PARATHYROIDECTOMY  04/02/15   [place] IMPEDANCE STUDY N/A 12/17/2015   Procedure: [place] IMPEDANCE STUDY;  Surgeon: Mauri Pole, MD;  [facility];  Service: Endoscopy;  Laterality: N/A;   TOTAL HIP ARTHROPLASTY Left 04/10/2014   Procedure: LEFT TOTAL HIP ARTHROPLASTY ANTERIOR APPROACH;  Surgeon: Gearlean Alf, MD;  [facility];  Service: Orthopedics;  Laterality: Left;   UMBILICAL HERNIA REPAIR N/A 02/08/2014   Procedure: HERNIA REPAIR UMBILICAL ADULT WITH MESH;  Surgeon: Jackolyn Confer, MD;  [facility];  Service: General;  Laterality: N/A;    There were no vitals filed for this visit.   Subjective Assessment - 10/09/20 1151     Subjective feeling better. still in left post hip /back esp with wrong step. " I loved the patch" pt amb in moving much quicker and less use of RW    Currently in Pain? Yes    Pain Score 7     Pain Location Buttocks    Pain Orientation Left    Pain Descriptors / Indicators Aching                               [facility] Adult PT Treatment/Exercise - 10/09/20 0001       Lumbar Exercises: Aerobic   Nustep L 4 5 min      Lumbar Exercises: Seated   Sit to Stand 10 reps   2 sets 5 with wt ball chest press   Other Seated Lumbar Exercises pelvic ROM 10 x 4 way on dyna disc   LAQ on dyna disc 2 sets 10   Other Seated Lumbar Exercises isometric abs with ball 2 sets 10      Knee/Hip Exercises: Seated   Ball Squeeze 2 sets 10 3 sec    Clamshell with TheraBand  Red    Marching Strengthening;Left;2 sets;10 reps   red tband     Iontophoresis   Type of Iontophoresis Dexamethasone    Location left SI    Dose 48m dose    Time 4 hours                      PT Short Term Goals - 09/25/20 1557       PT SHORT TERM GOAL #1   Title independent with HEP    Time 2    Period Weeks    Status Achieved               PT Long Term Goals - 09/25/20 1557       PT LONG TERM GOAL #1   Title go up and down stairs step over step consistently    Time 8    Period Weeks    Status New      PT LONG TERM GOAL #2   Title return to independent gym program    Time 8    Period Weeks    Status New      PT LONG TERM GOAL #3   Title walk without device without deviation    Time 12    Period Weeks    Status New      PT LONG TERM GOAL #4   Title report pain decreased 50%    Time 8    Period Weeks    Status New                   Plan - 10/09/20 1224     Clinical Impression Statement pt moving better, quicker steps with less use of RW. pt verb good relief with patch . progressed ex today with cuing needed    PT Treatment/Interventions ADLs/Self Care Home Management;Cryotherapy;Electrical Stimulation;Iontophoresis '4mg'$ /ml Dexamethasone;Moist Heat;Gait training;Stair training;Functional mobility training;Therapeutic activities;Therapeutic exercise;Balance training;Neuromuscular re-education;Manual techniques;Patient/family education    PT Next Visit Plan see how she is doing with the pelvic stability and see if the ionto had any benefit             Patient will  benefit from skilled therapeutic intervention in order to improve the following deficits and impairments:  Difficulty walking, Abnormal gait, Cardiopulmonary status limiting activity, Decreased endurance, Pain, Decreased activity tolerance, Decreased balance, Impaired flexibility, Decreased strength  Visit Diagnosis: Pain in left hip  Muscle weakness  (generalized)  Difficulty in walking, not elsewhere classified     Problem List Patient Active Problem List   Diagnosis Date Noted   Upper respiratory infection 01/15/2018   Postherpetic neuralgia 12/25/2016   Dysesthesia 12/25/2016   Polyneuropathy 12/25/2016   Exposure to influenza 04/01/2016   Dysphagia    Bronchiectasis without acute exacerbation ([place]) 08/30/2015   Bronchiectasis ([place]) 08/14/2015   Chronic rhinitis 08/14/2015   Avascular necrosis of femur head, left ([place]) 04/10/2014   OA (osteoarthritis) of hip 04/10/2014   Painful orthopaedic hardware ([place]) 10/17/2013    Leslee Suire,ANGIE PTA 10/09/2020, 12:26 PM  [place]. [place], [place], [address] Phone: [phone]   Fax:  [phone]  Name: Brooke Jimenez MRN: [id number] Date of Birth: September 24, 1945

## 2020-10-11 ENCOUNTER — Ambulatory Visit: Payer: Medicare Other | Admitting: Physical Therapy

## 2020-10-11 ENCOUNTER — Other Ambulatory Visit: Payer: Self-pay

## 2020-10-11 DIAGNOSIS — M25552 Pain in left hip: Secondary | ICD-10-CM | POA: Diagnosis not present

## 2020-10-11 DIAGNOSIS — M6281 Muscle weakness (generalized): Secondary | ICD-10-CM

## 2020-10-11 DIAGNOSIS — R262 Difficulty in walking, not elsewhere classified: Secondary | ICD-10-CM

## 2020-10-11 NOTE — Therapy (Signed)
[facility] [place]. [place], [place], [address] Phone: [phone]   Fax:  [phone]  Physical Therapy Treatment  Patient Details  Name: Brooke Jimenez MRN: [id number] Date of Birth: 11-Jan-1946 Referring Provider (PT): Aluisio   Encounter Date: 10/11/2020   PT End of Session - 10/11/20 1223     Visit Number 5    Date for PT Re-Evaluation 12/26/20    Authorization Type Medicare    PT Start Time 1150    PT Stop Time 1230    PT Time Calculation (min) 40 min             Past Medical History:  Diagnosis Date   Acne rosacea    Anxiety    Arthritis    arthritis in hip    Bronchiectasis ([place])    Cataract    beginning   Complication of anesthesia    "really sore throat"   Corneal dystrophy    DDD (degenerative disc disease), cervical    and lower back   Diverticulosis    Dysphagia    chronic- please see ultrasound done 4/16 15 in [facility]    Dysrhythmia    sometimes patient has extra beats per patient    Esophageal dysmotility    Family history of adverse reaction to anesthesia    mother slow to wake up    GERD (gastroesophageal reflux disease)    under control   Glaucoma    "suspect"   Gout    one finger x1 attack   H/O hiatal hernia    Hearing loss    bilateral due to nerve loss. wears hearing aides   Heart murmur    no problem    History of chicken pox    History of melanoma    melanoma- 1986 and 1997    Hypothyroidism    Low back pain with sciatica    Measles    hx of   Mitral valve prolapse    Nocturia    Osteoporosis    Peripheral neuropathy    feet   PONV (postoperative nausea and vomiting)    PTSD (post-traumatic stress disorder) 2006   RSD (reflex sympathetic dystrophy) 1993   Spider veins    Urinary incontinence    occasional   Varicose veins     Past Surgical History:  Procedure Laterality Date   ESOPHAGEAL MANOMETRY N/A 12/17/2015   Procedure: ESOPHAGEAL MANOMETRY (EM);   Surgeon: Mauri Pole, MD;  [facility];  Service: Endoscopy;  Laterality: N/A;   HARDWARE REMOVAL Left 10/17/2013   Procedure: HARDWARE REMOVAL LEFT HIP;  Surgeon: Gearlean Alf, MD;  [facility];  Service: Orthopedics;  Laterality: Left;   HIP FRACTURE SURGERY Left 2006   3 screws   INSERTION OF MESH  02/08/2014   Procedure: INSERTION OF MESH;  Surgeon: Jackolyn Confer, MD;  [facility];  Service: General;;   melanoma surgery   1986, 1997   PARATHYROIDECTOMY  04/02/15   [place] IMPEDANCE STUDY N/A 12/17/2015   Procedure: [place] IMPEDANCE STUDY;  Surgeon: Mauri Pole, MD;  [facility];  Service: Endoscopy;  Laterality: N/A;   TOTAL HIP ARTHROPLASTY Left 04/10/2014   Procedure: LEFT TOTAL HIP ARTHROPLASTY ANTERIOR APPROACH;  Surgeon: Gearlean Alf, MD;  [facility];  Service: Orthopedics;  Laterality: Left;   UMBILICAL HERNIA REPAIR N/A 02/08/2014   Procedure: HERNIA REPAIR UMBILICAL ADULT WITH MESH;  Surgeon: Jackolyn Confer, MD;  [facility];  Service: General;  Laterality: N/A;    There were no vitals filed for this visit.   Subjective Assessment - 10/11/20 1151     Subjective dull ache, not sharp pain. muscle soreness. good work out with [employer] rehab weds    Currently in Pain? Yes    Pain Score 6     Pain Location Buttocks                               [facility] Adult PT Treatment/Exercise - 10/11/20 0001       Lumbar Exercises: Aerobic   Nustep L 4 6 min      Lumbar Exercises: Supine   Bridge with Ball Squeeze Compliant;10 reps;3 seconds   then just 10 iso ball squeeze     Knee/Hip Exercises: Standing   Other Standing Knee Exercises red tband hip 3 way 10 x BIL   UE on walker     Knee/Hip Exercises: Seated   Ball Squeeze 2 sets 10 3 sec    Clamshell with TheraBand Red    Marching Strengthening;Left;2 sets;10 reps   red tband     Iontophoresis   Type of Iontophoresis Dexamethasone    Location left SI     Dose 61m dose    Time 4 hours      Manual Therapy   Manual therapy comments checked SI and =                      PT Short Term Goals - 09/25/20 1557       PT SHORT TERM GOAL #1   Title independent with HEP    Time 2    Period Weeks    Status Achieved               PT Long Term Goals - 10/11/20 1217       PT LONG TERM GOAL #1   Title go up and down stairs step over step consistently    Status On-going      PT LONG TERM GOAL #2   Title return to independent gym program    Status On-going      PT LONG TERM GOAL #3   Title walk without device without deviation    Status On-going      PT LONG TERM GOAL #4   Title report pain decreased 50%    Status Partially Met                   Plan - 10/11/20 1217     Clinical Impression Statement pt still amb with RW. difficulty with supine ex d/t breathing so moved  to sitting and added standing today with some increased pian with SLS on left. cuing needed with ex fo rspeed and control. pt with many questions re: SI so answered and explained SI rotation ( however pt was = alignment) need to strengthen and decrease inflammation.    PT Treatment/Interventions ADLs/Self Care Home Management;Cryotherapy;Electrical Stimulation;Iontophoresis 496mml Dexamethasone;Moist Heat;Gait training;Stair training;Functional mobility training;Therapeutic activities;Therapeutic exercise;Balance training;Neuromuscular re-education;Manual techniques;Patient/family education    PT Next Visit Plan progress pelvic stability             Patient will benefit from skilled therapeutic intervention in order to improve the following deficits and impairments:  Difficulty walking, Abnormal gait, Cardiopulmonary status limiting activity, Decreased endurance, Pain, Decreased activity tolerance, Decreased balance, Impaired flexibility, Decreased strength  Visit Diagnosis: Pain in  left hip  Muscle weakness  (generalized)  Difficulty in walking, not elsewhere classified     Problem List Patient Active Problem List   Diagnosis Date Noted   Upper respiratory infection 01/15/2018   Postherpetic neuralgia 12/25/2016   Dysesthesia 12/25/2016   Polyneuropathy 12/25/2016   Exposure to influenza 04/01/2016   Dysphagia    Bronchiectasis without acute exacerbation ([place]) 08/30/2015   Bronchiectasis ([place]) 08/14/2015   Chronic rhinitis 08/14/2015   Avascular necrosis of femur head, left ([place]) 04/10/2014   OA (osteoarthritis) of hip 04/10/2014   Painful orthopaedic hardware ([place]) 10/17/2013    Graycen Degan,ANGIE PTA 10/11/2020, 12:24 PM  [place]. [place], [place], [address] Phone: [phone]   Fax:  [phone]  Name: Eniya Cannady MRN: [id number] Date of Birth: 1945/07/26

## 2020-10-16 ENCOUNTER — Ambulatory Visit: Payer: Medicare Other | Admitting: Physical Therapy

## 2020-10-16 ENCOUNTER — Encounter: Payer: Self-pay | Admitting: Physical Therapy

## 2020-10-16 ENCOUNTER — Other Ambulatory Visit: Payer: Self-pay

## 2020-10-16 DIAGNOSIS — R262 Difficulty in walking, not elsewhere classified: Secondary | ICD-10-CM

## 2020-10-16 DIAGNOSIS — M25552 Pain in left hip: Secondary | ICD-10-CM | POA: Diagnosis not present

## 2020-10-16 DIAGNOSIS — M6281 Muscle weakness (generalized): Secondary | ICD-10-CM

## 2020-10-16 NOTE — Therapy (Signed)
[facility] [place]. [place], [place], [address] Phone: [phone]   Fax:  [phone]  Physical Therapy Treatment  Patient Details  Name: Brooke Jimenez MRN: [id number] Date of Birth: [birth year] Referring Provider (PT): Aluisio   Encounter Date: 10/16/2020   PT End of Session - 10/16/20 1157     Visit Number 6    Date for PT Re-Evaluation 12/26/20    Authorization Type Medicare    PT Start Time 1101    PT Stop Time 1150    PT Time Calculation (min) 49 min    Activity Tolerance Patient limited by pain    Behavior During Therapy [facility] for tasks assessed/performed             Past Medical History:  Diagnosis Date   Acne rosacea    Anxiety    Arthritis    arthritis in hip    Bronchiectasis ([place])    Cataract    beginning   Complication of anesthesia    "really sore throat"   Corneal dystrophy    DDD (degenerative disc disease), cervical    and lower back   Diverticulosis    Dysphagia    chronic- please see ultrasound done 4/16 15 in [facility]    Dysrhythmia    sometimes patient has extra beats per patient    Esophageal dysmotility    Family history of adverse reaction to anesthesia    mother slow to wake up    GERD (gastroesophageal reflux disease)    under control   Glaucoma    "suspect"   Gout    one finger x1 attack   H/O hiatal hernia    Hearing loss    bilateral due to nerve loss. wears hearing aides   Heart murmur    no problem    History of chicken pox    History of melanoma    melanoma- 1986 and 1997    Hypothyroidism    Low back pain with sciatica    Measles    hx of   Mitral valve prolapse    Nocturia    Osteoporosis    Peripheral neuropathy    feet   PONV (postoperative nausea and vomiting)    PTSD (post-traumatic stress disorder) 2006   RSD (reflex sympathetic dystrophy) 1993   Spider veins    Urinary incontinence    occasional   Varicose veins     Past Surgical History:   Procedure Laterality Date   ESOPHAGEAL MANOMETRY N/A 12/17/2015   Procedure: ESOPHAGEAL MANOMETRY (EM);  Surgeon: Mauri Pole, MD;  [facility];  Service: Endoscopy;  Laterality: N/A;   HARDWARE REMOVAL Left 10/17/2013   Procedure: HARDWARE REMOVAL LEFT HIP;  Surgeon: Gearlean Alf, MD;  [facility];  Service: Orthopedics;  Laterality: Left;   HIP FRACTURE SURGERY Left 2006   3 screws   INSERTION OF MESH  02/08/2014   Procedure: INSERTION OF MESH;  Surgeon: Jackolyn Confer, MD;  [facility];  Service: General;;   melanoma surgery   1986, 1997   PARATHYROIDECTOMY  04/02/15   [place] IMPEDANCE STUDY N/A 12/17/2015   Procedure: [place] IMPEDANCE STUDY;  Surgeon: Mauri Pole, MD;  [facility];  Service: Endoscopy;  Laterality: N/A;   TOTAL HIP ARTHROPLASTY Left 04/10/2014   Procedure: LEFT TOTAL HIP ARTHROPLASTY ANTERIOR APPROACH;  Surgeon: Gearlean Alf, MD;  [facility];  Service: Orthopedics;  Laterality: Left;   UMBILICAL HERNIA REPAIR N/A  02/08/2014   Procedure: HERNIA REPAIR UMBILICAL ADULT WITH MESH;  Surgeon: Jackolyn Confer, MD;  [facility];  Service: General;  Laterality: N/A;    There were no vitals filed for this visit.   Subjective Assessment - 10/16/20 1110     Subjective Patient is limping on the left today, c/o increase left anterior hip pain.  She reports that wegihtbearing exercises during last session may have caused this.    Currently in Pain? Yes    Pain Score 7     Pain Location Hip    Pain Orientation Left;Anterior    Pain Descriptors / Indicators Sore;Sharp    Aggravating Factors  reports that weightbearing on the left causes a sharp pain in hte left anterior hip                               [facility] Adult PT Treatment/Exercise - 10/16/20 0001       Ambulation/Gait   Gait Comments walking in clinic with [facility], painful trying to get normal steps, trying to stretch out the anterior hip, did this a  few laps      Lumbar Exercises: Aerobic   Nustep L 4 6 min      Lumbar Exercises: Supine   Pelvic Tilt 10 reps;5 seconds    Other Supine Lumbar Exercises ball squeeze 10 x 3 sec    Other Supine Lumbar Exercises feet on ball K2C, trunk roation, small bridge moslty isometric contraction posterior chain, isometric abs      Knee/Hip Exercises: Stretches   Hip Flexor Stretch Left;5 reps;10 seconds    Other Knee/Hip Stretches adductor stretches very easy and with assist      Knee/Hip Exercises: Machines for Strengthening   Cybex Knee Extension 5# 2x10    Cybex Knee Flexion 15# 2x10                      PT Short Term Goals - 09/25/20 1557       PT SHORT TERM GOAL #1   Title independent with HEP    Time 2    Period Weeks    Status Achieved               PT Long Term Goals - 10/11/20 1217       PT LONG TERM GOAL #1   Title go up and down stairs step over step consistently    Status On-going      PT LONG TERM GOAL #2   Title return to independent gym program    Status On-going      PT LONG TERM GOAL #3   Title walk without device without deviation    Status On-going      PT LONG TERM GOAL #4   Title report pain decreased 50%    Status Partially Met                   Plan - 10/16/20 1200     Clinical Impression Statement Patient comes in with c/o paiin in the left anterior hip and groin area, she is limping, reports the standing exercises last week increased the pain.  She is very tender and tight in the groin and the hip flexor.  I tried her walking some with the cane and cues for step length.  I asked her to do the nustep at home 5 minutes a day.  I also asked her  to take longer steps to open up the anterior hip some.  I asked her to add adductor stretches at home    PT Next Visit Plan see if we can get her walking better and using a cane or nothing, see how her pain is doing    Consulted and Agree with Plan of Care Patient              Patient will benefit from skilled therapeutic intervention in order to improve the following deficits and impairments:  Difficulty walking, Abnormal gait, Cardiopulmonary status limiting activity, Decreased endurance, Pain, Decreased activity tolerance, Decreased balance, Impaired flexibility, Decreased strength  Visit Diagnosis: Pain in left hip  Muscle weakness (generalized)  Difficulty in walking, not elsewhere classified     Problem List Patient Active Problem List   Diagnosis Date Noted   Upper respiratory infection 01/15/2018   Postherpetic neuralgia 12/25/2016   Dysesthesia 12/25/2016   Polyneuropathy 12/25/2016   Exposure to influenza 04/01/2016   Dysphagia    Bronchiectasis without acute exacerbation ([place]) 08/30/2015   Bronchiectasis ([place]) 08/14/2015   Chronic rhinitis 08/14/2015   Avascular necrosis of femur head, left ([place]) 04/10/2014   OA (osteoarthritis) of hip 04/10/2014   Painful orthopaedic hardware ([place]) 10/17/2013    Sumner Boast., PT 10/16/2020, 12:04 PM  [place]. [place], [place], [address] Phone: [phone]   Fax:  [phone]  Name: Beverely Suen MRN: [id number] Date of Birth: April 10, 1945

## 2020-10-18 ENCOUNTER — Ambulatory Visit: Payer: Medicare Other | Admitting: Physical Therapy

## 2021-02-04 ENCOUNTER — Other Ambulatory Visit: Payer: Self-pay | Admitting: Orthopedic Surgery

## 2021-02-04 DIAGNOSIS — M5440 Lumbago with sciatica, unspecified side: Secondary | ICD-10-CM

## 2021-02-26 ENCOUNTER — Other Ambulatory Visit: Payer: Self-pay

## 2021-02-26 ENCOUNTER — Ambulatory Visit
Admission: RE | Admit: 2021-02-26 | Discharge: 2021-02-26 | Disposition: A | Discharge status: Home / Self Care | Payer: Medicare Other | Source: Ambulatory Visit | Attending: Orthopedic Surgery | Admitting: Orthopedic Surgery

## 2021-02-26 DIAGNOSIS — M5440 Lumbago with sciatica, unspecified side: Secondary | ICD-10-CM

## 2021-03-18 ENCOUNTER — Encounter: Payer: Self-pay | Admitting: Physical Therapy

## 2021-03-18 ENCOUNTER — Other Ambulatory Visit: Payer: Self-pay

## 2021-03-18 ENCOUNTER — Ambulatory Visit: Payer: Medicare Other | Attending: Orthopedic Surgery | Admitting: Physical Therapy

## 2021-03-18 DIAGNOSIS — M545 Low back pain, unspecified: Secondary | ICD-10-CM | POA: Insufficient documentation

## 2021-03-18 DIAGNOSIS — G8929 Other chronic pain: Secondary | ICD-10-CM | POA: Insufficient documentation

## 2021-03-18 DIAGNOSIS — M6281 Muscle weakness (generalized): Secondary | ICD-10-CM | POA: Diagnosis present

## 2021-03-18 DIAGNOSIS — R262 Difficulty in walking, not elsewhere classified: Secondary | ICD-10-CM | POA: Diagnosis present

## 2021-03-18 DIAGNOSIS — M25552 Pain in left hip: Secondary | ICD-10-CM | POA: Insufficient documentation

## 2021-03-18 DIAGNOSIS — M542 Cervicalgia: Secondary | ICD-10-CM | POA: Diagnosis present

## 2021-03-18 NOTE — Therapy (Signed)
[place] [place], [place], [address] Phone: [phone]   Fax:  [phone]  Physical Therapy Evaluation  Patient Details  Name: Brooke Jimenez MRN: [id number] Date of Birth: [birth year] Referring Provider (PT): Rolena Infante   Encounter Date: 03/18/2021   PT End of Session - 03/18/21 1708     Visit Number 1    Date for PT Re-Evaluation 05/17/21    Authorization Type Medicare    PT Start Time 1455    PT Stop Time 1530    PT Time Calculation (min) 35 min    Activity Tolerance Patient limited by pain    Behavior During Therapy [facility] for tasks assessed/performed             Past Medical History:  Diagnosis Date   Acne rosacea    Anxiety    Arthritis    arthritis in hip    Bronchiectasis ([place])    Cataract    beginning   Complication of anesthesia    "really sore throat"   Corneal dystrophy    DDD (degenerative disc disease), cervical    and lower back   Diverticulosis    Dysphagia    chronic- please see ultrasound done 4/16 15 in EPIC    Dysrhythmia    sometimes patient has extra beats per patient    Esophageal dysmotility    Family history of adverse reaction to anesthesia    mother slow to wake up    GERD (gastroesophageal reflux disease)    under control   Glaucoma    "suspect"   Gout    one finger x1 attack   H/O hiatal hernia    Hearing loss    bilateral due to nerve loss. wears hearing aides   Heart murmur    no problem    History of chicken pox    History of melanoma    melanoma- 1986 and 1997    Hypothyroidism    Low back pain with sciatica    Measles    hx of   Mitral valve prolapse    Nocturia    Osteoporosis    Peripheral neuropathy    feet   PONV (postoperative nausea and vomiting)    PTSD (post-traumatic stress disorder) 2006   RSD (reflex sympathetic dystrophy) 1993   Spider veins    Urinary incontinence    occasional   Varicose veins     Past Surgical History:   Procedure Laterality Date   ESOPHAGEAL MANOMETRY N/A 12/17/2015   Procedure: ESOPHAGEAL MANOMETRY (EM);  Surgeon: Mauri Pole, MD;  [facility];  Service: Endoscopy;  Laterality: N/A;   HARDWARE REMOVAL Left 10/17/2013   Procedure: HARDWARE REMOVAL LEFT HIP;  Surgeon: Gearlean Alf, MD;  [facility];  Service: Orthopedics;  Laterality: Left;   HIP FRACTURE SURGERY Left 2006   3 screws   INSERTION OF MESH  02/08/2014   Procedure: INSERTION OF MESH;  Surgeon: Jackolyn Confer, MD;  [facility];  Service: General;;   melanoma surgery   1986, 1997   PARATHYROIDECTOMY  04/02/15   [place] IMPEDANCE STUDY N/A 12/17/2015   Procedure: [place] IMPEDANCE STUDY;  Surgeon: Mauri Pole, MD;  [facility];  Service: Endoscopy;  Laterality: N/A;   TOTAL HIP ARTHROPLASTY Left 04/10/2014   Procedure: LEFT TOTAL HIP ARTHROPLASTY ANTERIOR APPROACH;  Surgeon: Gearlean Alf, MD;  [facility];  Service: Orthopedics;  Laterality: Left;   UMBILICAL HERNIA REPAIR N/A  02/08/2014   Procedure: HERNIA REPAIR UMBILICAL ADULT WITH MESH;  Surgeon: Jackolyn Confer, MD;  [facility];  Service: General;  Laterality: N/A;    There were no vitals filed for this visit.    Subjective Assessment - 03/18/21 1457     Subjective Patient was seen here 5-6 months ago, was having significant back pain, she was found to have a fracture and PT was stopped.  She returns after the MD orders to rest and let it heal, she feels very weak, still hurting in the low back the ribs and the neck area.  She reports that she has gained 10-15 pounds.  She reports that she has severe breathing issues and this limits her ability to stay active.    Limitations Sitting;Standing;Walking;House hold activities    How long can you walk comfortably? has used a walker for the past 6 months    Patient Stated Goals have less pain, walk better, feel more stable, have better endurance    Currently in Pain? Yes     Pain Score 1     Pain Location Back   has some neck pain with motions   Pain Orientation Lower    Pain Descriptors / Indicators Aching;Dull    Pain Type Chronic pain    Pain Radiating Towards denies    Pain Onset More than a month ago    Pain Frequency Intermittent    Aggravating Factors  bending over, walking, movements pain can be up to 6/10 in the neck and the back    Pain Relieving Factors rest, some positions can have no pain    Effect of Pain on Daily Activities limits about everything                [facility] PT Assessment - 03/18/21 0001       Assessment   Medical Diagnosis back pain, neck pain,diff walking    Referring Provider (PT) Rolena Infante    Onset Date/Surgical Date 03/04/21    Prior Therapy yes      Balance Screen   Has the patient fallen in the past 6 months No    Has the patient had a decrease in activity level because of a fear of falling?  No    Is the patient reluctant to leave their home because of a fear of falling?  No      Home Environment   Additional Comments has stairs, does the houseork and cooking      Prior Function   Level of Independence Independent    Vocation Retired    Leisure no exercise      AROM   Overall AROM Comments cervical ROM flexion WFL's., extension decrease 100%, rotaiton decreased 50%, side bending decreased 75%,  Lumbar ROM decreased 50% with fear and some pain      Strength   Overall Strength Comments hips 3+/5 with fear, knees 4-/5      Palpation   Palpation comment she is tight and tender in the upper traps, the cervical parapsinals and the thoracic and lumbar paraspinals      Ambulation/Gait   Gait Comments using w 4WW, slow, without walker, shuffling steps and a limp on the left      Timed Up and Go Test   Normal TUG (seconds) 20    TUG Comments no device                        Objective measurements completed on examination:  See above findings.                PT Education - 03/18/21  1708     Education Details okayed her to resume breathing exercises, TA activation and the spirit bike at her house 4-5 minutes one time a day    Person(s) Educated Patient    Methods Explanation    Comprehension Verbalized understanding              PT Short Term Goals - 03/18/21 1712       PT SHORT TERM GOAL #1   Title independent with HEP    Time 2    Period Weeks    Status New               PT Long Term Goals - 03/18/21 1712       PT LONG TERM GOAL #1   Title go up and down stairs step over step consistently    Time 12    Period Weeks    Status New      PT LONG TERM GOAL #2   Title return to independent gym program    Time 12    Period Weeks    Status New      PT LONG TERM GOAL #3   Title walk without device with minimal deviation    Time 12    Period Weeks    Status New      PT LONG TERM GOAL #4   Title report pain decreased 50%    Time 12    Period Weeks    Status New      PT LONG TERM GOAL #5   Title decrease TUG time to 13 seconds    Time 12    Status New                    Plan - 03/18/21 1709     Clinical Impression Statement Patient was seen her about 5-6 months ago for hip pain, she was not getting better ans an x-ray showed a pelvic fracture, she was told to rest, she reports not doing much of anything for the pas 5-6 months, reports some back pain, weakness and difficulty perfomring ADL's, she does have breathing issues and reports that she feels it is worse since she has stopped doing her exercises.  She uses a 4WW. slow gait, antalgic on the left, has limited neck ROM and tighness and tenderness, some back pain.  The TUG was 20 seconds, has weakness of the LE's and the core    Stability/Clinical Decision Making Evolving/Moderate complexity    Clinical Decision Making Low    Rehab Potential Good    PT Frequency 2x / week    PT Duration 12 weeks    PT Treatment/Interventions ADLs/Self Care Home  Management;Cryotherapy;Electrical Stimulation;Iontophoresis 4mg /ml Dexamethasone;Moist Heat;Gait training;Stair training;Functional mobility training;Therapeutic activities;Therapeutic exercise;Balance training;Neuromuscular re-education;Manual techniques;Patient/family education    PT Next Visit Plan will need to progress slowly to assure no increase of pain and to help with her breathing    Consulted and Agree with Plan of Care Patient             Patient will benefit from skilled therapeutic intervention in order to improve the following deficits and impairments:  Difficulty walking, Abnormal gait, Cardiopulmonary status limiting activity, Decreased endurance, Pain, Decreased activity tolerance, Decreased balance, Impaired flexibility, Decreased strength, Decreased mobility  Visit Diagnosis: Pain in left hip - Plan: PT plan of  care cert/re-cert  Muscle weakness (generalized) - Plan: PT plan of care cert/re-cert  Difficulty in walking, not elsewhere classified - Plan: PT plan of care cert/re-cert  Chronic bilateral low back pain without sciatica - Plan: PT plan of care cert/re-cert  Cervicalgia - Plan: PT plan of care cert/re-cert     Problem List Patient Active Problem List   Diagnosis Date Noted   Upper respiratory infection 01/15/2018   Postherpetic neuralgia 12/25/2016   Dysesthesia 12/25/2016   Polyneuropathy 12/25/2016   Exposure to influenza 04/01/2016   Dysphagia    Bronchiectasis without acute exacerbation ([place]) 08/30/2015   Bronchiectasis ([place]) 08/14/2015   Chronic rhinitis 08/14/2015   Avascular necrosis of femur head, left ([place]) 04/10/2014   OA (osteoarthritis) of hip 04/10/2014   Painful orthopaedic hardware ([place]) 10/17/2013    Sumner Boast, PT 03/18/2021, 5:14 PM  [place]. [place], [place], [address] Phone: [phone]   Fax:  [phone]  Name: Kortlyn Koltz MRN:  [id number] Date of Birth: Jun 29, 1945

## 2021-03-21 ENCOUNTER — Other Ambulatory Visit: Payer: Self-pay

## 2021-03-21 ENCOUNTER — Ambulatory Visit: Payer: Medicare Other | Admitting: Physical Therapy

## 2021-03-21 DIAGNOSIS — M25552 Pain in left hip: Secondary | ICD-10-CM

## 2021-03-21 DIAGNOSIS — M6281 Muscle weakness (generalized): Secondary | ICD-10-CM

## 2021-03-21 DIAGNOSIS — R262 Difficulty in walking, not elsewhere classified: Secondary | ICD-10-CM

## 2021-03-21 NOTE — Therapy (Signed)
[place]. [place], [place], [address] Phone: [phone]   Fax:  [phone]  Physical Therapy Treatment  Patient Details  Name: Brooke Jimenez MRN: [id number] Date of Birth: 1945-12-31 Referring Provider (PT): Rolena Infante   Encounter Date: 03/21/2021   PT End of Session - 03/21/21 1005     Visit Number 2    Date for PT Re-Evaluation 05/17/21    Authorization Type Medicare    PT Start Time 0930    PT Stop Time 1015    PT Time Calculation (min) 45 min             Past Medical History:  Diagnosis Date   Acne rosacea    Anxiety    Arthritis    arthritis in hip    Bronchiectasis ([place])    Cataract    beginning   Complication of anesthesia    "really sore throat"   Corneal dystrophy    DDD (degenerative disc disease), cervical    and lower back   Diverticulosis    Dysphagia    chronic- please see ultrasound done 4/16 15 in EPIC    Dysrhythmia    sometimes patient has extra beats per patient    Esophageal dysmotility    Family history of adverse reaction to anesthesia    mother slow to wake up    GERD (gastroesophageal reflux disease)    under control   Glaucoma    "suspect"   Gout    one finger x1 attack   H/O hiatal hernia    Hearing loss    bilateral due to nerve loss. wears hearing aides   Heart murmur    no problem    History of chicken pox    History of melanoma    melanoma- 1986 and 1997    Hypothyroidism    Low back pain with sciatica    Measles    hx of   Mitral valve prolapse    Nocturia    Osteoporosis    Peripheral neuropathy    feet   PONV (postoperative nausea and vomiting)    PTSD (post-traumatic stress disorder) 2006   RSD (reflex sympathetic dystrophy) 1993   Spider veins    Urinary incontinence    occasional   Varicose veins     Past Surgical History:  Procedure Laterality Date   ESOPHAGEAL MANOMETRY N/A 12/17/2015   Procedure: ESOPHAGEAL MANOMETRY (EM);   Surgeon: Mauri Pole, MD;  [facility];  Service: Endoscopy;  Laterality: N/A;   HARDWARE REMOVAL Left 10/17/2013   Procedure: HARDWARE REMOVAL LEFT HIP;  Surgeon: Gearlean Alf, MD;  [facility];  Service: Orthopedics;  Laterality: Left;   HIP FRACTURE SURGERY Left 2006   3 screws   INSERTION OF MESH  02/08/2014   Procedure: INSERTION OF MESH;  Surgeon: Jackolyn Confer, MD;  [facility];  Service: General;;   melanoma surgery   1986, 1997   PARATHYROIDECTOMY  04/02/15   [place] IMPEDANCE STUDY N/A 12/17/2015   Procedure: [place] IMPEDANCE STUDY;  Surgeon: Mauri Pole, MD;  [facility];  Service: Endoscopy;  Laterality: N/A;   TOTAL HIP ARTHROPLASTY Left 04/10/2014   Procedure: LEFT TOTAL HIP ARTHROPLASTY ANTERIOR APPROACH;  Surgeon: Gearlean Alf, MD;  [facility];  Service: Orthopedics;  Laterality: Left;   UMBILICAL HERNIA REPAIR N/A 02/08/2014   Procedure: HERNIA REPAIR UMBILICAL ADULT WITH MESH;  Surgeon: Jackolyn Confer, MD;  [facility];  Service: General;  Laterality: N/A;    There were no vitals filed for this visit.   Subjective Assessment - 03/21/21 0937     Subjective did bike yesterday 5 min and ex- soreness, dull ache.    Currently in Pain? Yes    Pain Score 1     Pain Location Back    Pain Descriptors / Indicators Dull                               [facility] Adult PT Treatment/Exercise - 03/21/21 0001       Exercises   Exercises Lumbar;Knee/Hip      Lumbar Exercises: Aerobic   Nustep L 3 5 min      Lumbar Exercises: Seated   Other Seated Lumbar Exercises pelvic ROM on dyna disc 10 x with cuing   pt very focused on transve abd. pain with circles     Lumbar Exercises: Supine   Ab Set 15 reps;3 seconds   with ball   Pelvic Tilt 10 reps;3 reps   with focus on tranverse at pt request   Clam 10 reps   green tband   Bent Knee Raise 10 reps   green tband   Bridge with [employer];10 reps     Other Supine Lumbar Exercises feet on ball bridge,KTC and obl 10 x each   occassion assistance to steady ball                      PT Short Term Goals - 03/21/21 1005       PT SHORT TERM GOAL #1   Title independent with HEP    Status Achieved               PT Long Term Goals - 03/18/21 1712       PT LONG TERM GOAL #1   Title go up and down stairs step over step consistently    Time 12    Period Weeks    Status New      PT LONG TERM GOAL #2   Title return to independent gym program    Time 12    Period Weeks    Status New      PT LONG TERM GOAL #3   Title walk without device with minimal deviation    Time 12    Period Weeks    Status New      PT LONG TERM GOAL #4   Title report pain decreased 50%    Time 12    Period Weeks    Status New      PT LONG TERM GOAL #5   Title decrease TUG time to 13 seconds    Time 12    Status New                   Plan - 03/21/21 1006     Clinical Impression Statement STG met. progressed ex with core with cuing and pt very focused on transverse abdominals- educ on where it is an dbreathing is key to activate    PT Treatment/Interventions ADLs/Self Care Home Management;Cryotherapy;Electrical Stimulation;Iontophoresis 61m/ml Dexamethasone;Moist Heat;Gait training;Stair training;Functional mobility training;Therapeutic activities;Therapeutic exercise;Balance training;Neuromuscular re-education;Manual techniques;Patient/family education    PT Next Visit Plan will need to progress slowly to assure no increase of pain and to help with her breathing  Patient will benefit from skilled therapeutic intervention in order to improve the following deficits and impairments:  Difficulty walking, Abnormal gait, Cardiopulmonary status limiting activity, Decreased endurance, Pain, Decreased activity tolerance, Decreased balance, Impaired flexibility, Decreased strength, Decreased mobility  Visit  Diagnosis: Pain in left hip  Muscle weakness (generalized)  Difficulty in walking, not elsewhere classified     Problem List Patient Active Problem List   Diagnosis Date Noted   Upper respiratory infection 01/15/2018   Postherpetic neuralgia 12/25/2016   Dysesthesia 12/25/2016   Polyneuropathy 12/25/2016   Exposure to influenza 04/01/2016   Dysphagia    Bronchiectasis without acute exacerbation ([place]) 08/30/2015   Bronchiectasis ([place]) 08/14/2015   Chronic rhinitis 08/14/2015   Avascular necrosis of femur head, left ([place]) 04/10/2014   OA (osteoarthritis) of hip 04/10/2014   Painful orthopaedic hardware ([place]) 10/17/2013    Makye Radle,ANGIE, PTA 03/21/2021, 10:07 AM  [place]. [place], [place], [address] Phone: [phone]   Fax:  [phone]  Name: Brooke Jimenez MRN: [id number] Date of Birth: 03-07-1945

## 2021-03-26 ENCOUNTER — Ambulatory Visit: Payer: Medicare Other | Admitting: Physical Therapy

## 2021-03-26 DIAGNOSIS — M6281 Muscle weakness (generalized): Secondary | ICD-10-CM

## 2021-03-26 DIAGNOSIS — G8929 Other chronic pain: Secondary | ICD-10-CM

## 2021-03-26 DIAGNOSIS — M25552 Pain in left hip: Secondary | ICD-10-CM | POA: Diagnosis not present

## 2021-03-26 NOTE — Therapy (Signed)
[place]. [place], [place], [address] Phone: [phone]   Fax:  [phone]  Physical Therapy Treatment  Patient Details  Name: Brooke Jimenez MRN: [id number] Date of Birth: 07/11/1945 Referring Provider (PT): Rolena Infante   Encounter Date: 03/26/2021   PT End of Session - 03/26/21 1052     Visit Number 3    Date for PT Re-Evaluation 05/17/21    Authorization Type Medicare    PT Start Time 1019    PT Stop Time 1100    PT Time Calculation (min) 41 min             Past Medical History:  Diagnosis Date   Acne rosacea    Anxiety    Arthritis    arthritis in hip    Bronchiectasis ([place])    Cataract    beginning   Complication of anesthesia    "really sore throat"   Corneal dystrophy    DDD (degenerative disc disease), cervical    and lower back   Diverticulosis    Dysphagia    chronic- please see ultrasound done 4/16 15 in EPIC    Dysrhythmia    sometimes patient has extra beats per patient    Esophageal dysmotility    Family history of adverse reaction to anesthesia    mother slow to wake up    GERD (gastroesophageal reflux disease)    under control   Glaucoma    "suspect"   Gout    one finger x1 attack   H/O hiatal hernia    Hearing loss    bilateral due to nerve loss. wears hearing aides   Heart murmur    no problem    History of chicken pox    History of melanoma    melanoma- 1986 and 1997    Hypothyroidism    Low back pain with sciatica    Measles    hx of   Mitral valve prolapse    Nocturia    Osteoporosis    Peripheral neuropathy    feet   PONV (postoperative nausea and vomiting)    PTSD (post-traumatic stress disorder) 2006   RSD (reflex sympathetic dystrophy) 1993   Spider veins    Urinary incontinence    occasional   Varicose veins     Past Surgical History:  Procedure Laterality Date   ESOPHAGEAL MANOMETRY N/A 12/17/2015   Procedure: ESOPHAGEAL MANOMETRY (EM);   Surgeon: Mauri Pole, MD;  [facility];  Service: Endoscopy;  Laterality: N/A;   HARDWARE REMOVAL Left 10/17/2013   Procedure: HARDWARE REMOVAL LEFT HIP;  Surgeon: Gearlean Alf, MD;  [facility];  Service: Orthopedics;  Laterality: Left;   HIP FRACTURE SURGERY Left 2006   3 screws   INSERTION OF MESH  02/08/2014   Procedure: INSERTION OF MESH;  Surgeon: Jackolyn Confer, MD;  [facility];  Service: General;;   melanoma surgery   1986, 1997   PARATHYROIDECTOMY  04/02/15   [place] IMPEDANCE STUDY N/A 12/17/2015   Procedure: [place] IMPEDANCE STUDY;  Surgeon: Mauri Pole, MD;  [facility];  Service: Endoscopy;  Laterality: N/A;   TOTAL HIP ARTHROPLASTY Left 04/10/2014   Procedure: LEFT TOTAL HIP ARTHROPLASTY ANTERIOR APPROACH;  Surgeon: Gearlean Alf, MD;  [facility];  Service: Orthopedics;  Laterality: Left;   UMBILICAL HERNIA REPAIR N/A 02/08/2014   Procedure: HERNIA REPAIR UMBILICAL ADULT WITH MESH;  Surgeon: Jackolyn Confer, MD;  [facility];  Service: General;  Laterality: N/A;    There were no vitals filed for this visit.   Subjective Assessment - 03/26/21 1019     Subjective very sore after last session. feeling okay today- increased pain with actvity    Currently in Pain? Yes    Pain Score 2     Pain Location Back    Pain Orientation Lower                               [facility] Adult PT Treatment/Exercise - 03/26/21 0001       Lumbar Exercises: Aerobic   Nustep L 3 6 min      Lumbar Exercises: Seated   Sit to Stand 10 reps   2 sets 5   Other Seated Lumbar Exercises pelvic ROM on dyna disc 10 x with cuing   added LAQ,hip abd and hip flex on disc red tband 10 each. Shld ext ,row and ER 10 each with tband on disc   Other Seated Lumbar Exercises wt ball press and rotation 10 each      Lumbar Exercises: Supine   Ab Set 20 reps   iso with physioball                      PT Short Term Goals -  03/21/21 1005       PT SHORT TERM GOAL #1   Title independent with HEP    Status Achieved               PT Long Term Goals - 03/18/21 1712       PT LONG TERM GOAL #1   Title go up and down stairs step over step consistently    Time 12    Period Weeks    Status New      PT LONG TERM GOAL #2   Title return to independent gym program    Time 12    Period Weeks    Status New      PT LONG TERM GOAL #3   Title walk without device with minimal deviation    Time 12    Period Weeks    Status New      PT LONG TERM GOAL #4   Title report pain decreased 50%    Time 12    Period Weeks    Status New      PT LONG TERM GOAL #5   Title decrease TUG time to 13 seconds    Time 12    Status New                   Plan - 03/26/21 1053     Clinical Impression Statement pt very sore after last session so tried to adjust some ex. focus alot on stab ex on dyna disc with cuing for correct tech and breathing- minimal c/o left LBP pain with certain mvmt.    PT Treatment/Interventions ADLs/Self Care Home Management;Cryotherapy;Electrical Stimulation;Iontophoresis 4mg /ml Dexamethasone;Moist Heat;Gait training;Stair training;Functional mobility training;Therapeutic activities;Therapeutic exercise;Balance training;Neuromuscular re-education;Manual techniques;Patient/family education    PT Next Visit Plan assess and progress             Patient will benefit from skilled therapeutic intervention in order to improve the following deficits and impairments:  Difficulty walking, Abnormal gait, Cardiopulmonary status limiting activity, Decreased endurance, Pain, Decreased activity tolerance, Decreased balance, Impaired flexibility, Decreased strength, Decreased mobility  Visit Diagnosis:  Muscle weakness (generalized)  Chronic bilateral low back pain without sciatica     Problem List Patient Active Problem List   Diagnosis Date Noted   Upper respiratory infection 01/15/2018    Postherpetic neuralgia 12/25/2016   Dysesthesia 12/25/2016   Polyneuropathy 12/25/2016   Exposure to influenza 04/01/2016   Dysphagia    Bronchiectasis without acute exacerbation ([place]) 08/30/2015   Bronchiectasis ([place]) 08/14/2015   Chronic rhinitis 08/14/2015   Avascular necrosis of femur head, left ([place]) 04/10/2014   OA (osteoarthritis) of hip 04/10/2014   Painful orthopaedic hardware ([place]) 10/17/2013    Prithvi Kooi,ANGIE, PTA 03/26/2021, 10:55 AM  [place]. [place], [place], [address] Phone: [phone]   Fax:  [phone]  Name: Brooke Jimenez MRN: [id number] Date of Birth: [birth year]

## 2021-03-28 ENCOUNTER — Other Ambulatory Visit: Payer: Self-pay

## 2021-03-28 ENCOUNTER — Ambulatory Visit: Payer: Medicare Other | Attending: Orthopedic Surgery | Admitting: Physical Therapy

## 2021-03-28 DIAGNOSIS — G8929 Other chronic pain: Secondary | ICD-10-CM | POA: Diagnosis present

## 2021-03-28 DIAGNOSIS — R262 Difficulty in walking, not elsewhere classified: Secondary | ICD-10-CM | POA: Diagnosis present

## 2021-03-28 DIAGNOSIS — M542 Cervicalgia: Secondary | ICD-10-CM | POA: Insufficient documentation

## 2021-03-28 DIAGNOSIS — M6281 Muscle weakness (generalized): Secondary | ICD-10-CM | POA: Insufficient documentation

## 2021-03-28 DIAGNOSIS — M545 Low back pain, unspecified: Secondary | ICD-10-CM | POA: Diagnosis present

## 2021-03-28 DIAGNOSIS — M25552 Pain in left hip: Secondary | ICD-10-CM | POA: Diagnosis present

## 2021-03-28 NOTE — Therapy (Signed)
[place]. [place], [place], [address] Phone: [phone]   Fax:  [phone]  Physical Therapy Treatment  Patient Details  Name: Brooke Jimenez MRN: [id number] Date of Birth: 23-Mar-1945 Referring Provider (PT): Rolena Infante   Encounter Date: 03/28/2021   PT End of Session - 03/28/21 1521     Visit Number 4    Date for PT Re-Evaluation 05/17/21    Authorization Type Medicare    PT Start Time 1448    PT Stop Time 1526    PT Time Calculation (min) 38 min             Past Medical History:  Diagnosis Date   Acne rosacea    Anxiety    Arthritis    arthritis in hip    Bronchiectasis ([place])    Cataract    beginning   Complication of anesthesia    "really sore throat"   Corneal dystrophy    DDD (degenerative disc disease), cervical    and lower back   Diverticulosis    Dysphagia    chronic- please see ultrasound done 4/16 15 in [facility]    Dysrhythmia    sometimes patient has extra beats per patient    Esophageal dysmotility    Family history of adverse reaction to anesthesia    mother slow to wake up    GERD (gastroesophageal reflux disease)    under control   Glaucoma    "suspect"   Gout    one finger x1 attack   H/O hiatal hernia    Hearing loss    bilateral due to nerve loss. wears hearing aides   Heart murmur    no problem    History of chicken pox    History of melanoma    melanoma- 1986 and 1997    Hypothyroidism    Low back pain with sciatica    Measles    hx of   Mitral valve prolapse    Nocturia    Osteoporosis    Peripheral neuropathy    feet   PONV (postoperative nausea and vomiting)    PTSD (post-traumatic stress disorder) 2006   RSD (reflex sympathetic dystrophy) 1993   Spider veins    Urinary incontinence    occasional   Varicose veins     Past Surgical History:  Procedure Laterality Date   ESOPHAGEAL MANOMETRY N/A 12/17/2015   Procedure: ESOPHAGEAL MANOMETRY (EM);   Surgeon: Mauri Pole, MD;  [facility];  Service: Endoscopy;  Laterality: N/A;   HARDWARE REMOVAL Left 10/17/2013   Procedure: HARDWARE REMOVAL LEFT HIP;  Surgeon: Gearlean Alf, MD;  [facility];  Service: Orthopedics;  Laterality: Left;   HIP FRACTURE SURGERY Left 2006   3 screws   INSERTION OF MESH  02/08/2014   Procedure: INSERTION OF MESH;  Surgeon: Jackolyn Confer, MD;  [facility];  Service: General;;   melanoma surgery   1986, 1997   PARATHYROIDECTOMY  04/02/15   [place] IMPEDANCE STUDY N/A 12/17/2015   Procedure: [place] IMPEDANCE STUDY;  Surgeon: Mauri Pole, MD;  [facility];  Service: Endoscopy;  Laterality: N/A;   TOTAL HIP ARTHROPLASTY Left 04/10/2014   Procedure: LEFT TOTAL HIP ARTHROPLASTY ANTERIOR APPROACH;  Surgeon: Gearlean Alf, MD;  [facility];  Service: Orthopedics;  Laterality: Left;   UMBILICAL HERNIA REPAIR N/A 02/08/2014   Procedure: HERNIA REPAIR UMBILICAL ADULT WITH MESH;  Surgeon: Jackolyn Confer, MD;  [facility];  Service: General;  Laterality: N/A;    There were no vitals filed for this visit.   Subjective Assessment - 03/28/21 1448     Subjective very very sore again,yesterday was bad. today okay. rode bike 4-5 min. zoom class for cardio pulm 1 hour and 30 min 3 x a week- alot of bending but modify as I need too    Currently in Pain? No/denies                               [facility] Adult PT Treatment/Exercise - 03/28/21 0001       Lumbar Exercises: Aerobic   Nustep L 3 6 min      Lumbar Exercises: Standing   Heel Raises 10 reps   black bar   Other Standing Lumbar Exercises mini squats 10 x   resisted gait fwd 5 x and laterally 3 x each 10# each side ( min A ,decreased eccentric lateral mvmt)   Other Standing Lumbar Exercises yellow tband 10 x each shld ext,row and ER   8 inch step tap 10 x each CGA     Manual Therapy   Manual Therapy Passive ROM    Manual therapy comments very  tight HS    Passive ROM gentle stretching to BIL LE                       PT Short Term Goals - 03/21/21 1005       PT SHORT TERM GOAL #1   Title independent with HEP    Status Achieved               PT Long Term Goals - 03/28/21 1520       PT LONG TERM GOAL #1   Title go up and down stairs step over step consistently    Status On-going      PT LONG TERM GOAL #2   Title return to independent gym program    Status On-going      PT LONG TERM GOAL #3   Title walk without device with minimal deviation    Status On-going      PT LONG TERM GOAL #4   Title report pain decreased 50%    Status On-going      PT LONG TERM GOAL #5   Title decrease TUG time to 13 seconds    Status On-going                   Plan - 03/28/21 1521     Clinical Impression Statement pt srrived again with c/o increase pain again after last session but resolved after a day. pt is also doing 90 min cardio pulm zoom classes- edcu on avoiding twisting ex thru zoom. changed up some ex and added very gentle strteching- pain limited and very tight BIL HS    PT Treatment/Interventions ADLs/Self Care Home Management;Cryotherapy;Electrical Stimulation;Iontophoresis 4mg /ml Dexamethasone;Moist Heat;Gait training;Stair training;Functional mobility training;Therapeutic activities;Therapeutic exercise;Balance training;Neuromuscular re-education;Manual techniques;Patient/family education    PT Next Visit Plan assess and progress             Patient will benefit from skilled therapeutic intervention in order to improve the following deficits and impairments:  Difficulty walking, Abnormal gait, Cardiopulmonary status limiting activity, Decreased endurance, Pain, Decreased activity tolerance, Decreased balance, Impaired flexibility, Decreased strength, Decreased mobility  Visit Diagnosis: Muscle weakness (generalized)  Chronic bilateral low back pain without sciatica  Difficulty in  walking, not elsewhere classified     Problem List Patient Active Problem List   Diagnosis Date Noted   Upper respiratory infection 01/15/2018   Postherpetic neuralgia 12/25/2016   Dysesthesia 12/25/2016   Polyneuropathy 12/25/2016   Exposure to influenza 04/01/2016   Dysphagia    Bronchiectasis without acute exacerbation ([place]) 08/30/2015   Bronchiectasis ([place]) 08/14/2015   Chronic rhinitis 08/14/2015   Avascular necrosis of femur head, left ([place]) 04/10/2014   OA (osteoarthritis) of hip 04/10/2014   Painful orthopaedic hardware ([place]) 10/17/2013    Monterio Bob,ANGIE, PTA 03/28/2021, 3:25 PM  [place]. [place], [place], [address] Phone: [phone]   Fax:  [phone]  Name: Odette Watanabe MRN: [id number] Date of Birth: March 22, 1945

## 2021-04-02 ENCOUNTER — Encounter: Payer: Self-pay | Admitting: Physical Therapy

## 2021-04-02 ENCOUNTER — Ambulatory Visit: Payer: Medicare Other | Admitting: Physical Therapy

## 2021-04-02 ENCOUNTER — Other Ambulatory Visit: Payer: Self-pay

## 2021-04-02 DIAGNOSIS — M542 Cervicalgia: Secondary | ICD-10-CM

## 2021-04-02 DIAGNOSIS — M6281 Muscle weakness (generalized): Secondary | ICD-10-CM

## 2021-04-02 DIAGNOSIS — G8929 Other chronic pain: Secondary | ICD-10-CM

## 2021-04-02 DIAGNOSIS — M25552 Pain in left hip: Secondary | ICD-10-CM

## 2021-04-02 DIAGNOSIS — R262 Difficulty in walking, not elsewhere classified: Secondary | ICD-10-CM

## 2021-04-02 NOTE — Therapy (Signed)
[place]. [place], [place], [address] Phone: [phone]   Fax:  [phone]  Physical Therapy Treatment  Patient Details  Name: Maricel Swartzendruber MRN: [id number] Date of Birth: 24-Jun-1945 Referring Provider (PT): Rolena Infante   Encounter Date: 04/02/2021   PT End of Session - 04/02/21 1447     Visit Number 5    Date for PT Re-Evaluation 05/17/21    Authorization Type Medicare    PT Start Time 1355    PT Stop Time 1443    PT Time Calculation (min) 48 min    Activity Tolerance Patient limited by pain    Behavior During Therapy [facility] for tasks assessed/performed             Past Medical History:  Diagnosis Date   Acne rosacea    Anxiety    Arthritis    arthritis in hip    Bronchiectasis ([place])    Cataract    beginning   Complication of anesthesia    "really sore throat"   Corneal dystrophy    DDD (degenerative disc disease), cervical    and lower back   Diverticulosis    Dysphagia    chronic- please see ultrasound done 4/16 15 in EPIC    Dysrhythmia    sometimes patient has extra beats per patient    Esophageal dysmotility    Family history of adverse reaction to anesthesia    mother slow to wake up    GERD (gastroesophageal reflux disease)    under control   Glaucoma    "suspect"   Gout    one finger x1 attack   H/O hiatal hernia    Hearing loss    bilateral due to nerve loss. wears hearing aides   Heart murmur    no problem    History of chicken pox    History of melanoma    melanoma- 1986 and 1997    Hypothyroidism    Low back pain with sciatica    Measles    hx of   Mitral valve prolapse    Nocturia    Osteoporosis    Peripheral neuropathy    feet   PONV (postoperative nausea and vomiting)    PTSD (post-traumatic stress disorder) 2006   RSD (reflex sympathetic dystrophy) 1993   Spider veins    Urinary incontinence    occasional   Varicose veins     Past Surgical History:   Procedure Laterality Date   ESOPHAGEAL MANOMETRY N/A 12/17/2015   Procedure: ESOPHAGEAL MANOMETRY (EM);  Surgeon: Mauri Pole, MD;  [facility];  Service: Endoscopy;  Laterality: N/A;   HARDWARE REMOVAL Left 10/17/2013   Procedure: HARDWARE REMOVAL LEFT HIP;  Surgeon: Gearlean Alf, MD;  [facility];  Service: Orthopedics;  Laterality: Left;   HIP FRACTURE SURGERY Left 2006   3 screws   INSERTION OF MESH  02/08/2014   Procedure: INSERTION OF MESH;  Surgeon: Jackolyn Confer, MD;  [facility];  Service: General;;   melanoma surgery   1986, 1997   PARATHYROIDECTOMY  04/02/15   [place] IMPEDANCE STUDY N/A 12/17/2015   Procedure: [place] IMPEDANCE STUDY;  Surgeon: Mauri Pole, MD;  [facility];  Service: Endoscopy;  Laterality: N/A;   TOTAL HIP ARTHROPLASTY Left 04/10/2014   Procedure: LEFT TOTAL HIP ARTHROPLASTY ANTERIOR APPROACH;  Surgeon: Gearlean Alf, MD;  [facility];  Service: Orthopedics;  Laterality: Left;   UMBILICAL HERNIA REPAIR N/A  02/08/2014   Procedure: HERNIA REPAIR UMBILICAL ADULT WITH MESH;  Surgeon: Jackolyn Confer, MD;  [facility];  Service: General;  Laterality: N/A;    There were no vitals filed for this visit.   Subjective Assessment - 04/02/21 1420     Subjective Patient with multiple quesitons about the MRI in August, reports some left lateral knee pain    Currently in Pain? Yes    Pain Score 5     Pain Location Knee    Pain Orientation Left;Lateral    Aggravating Factors  unsure    Pain Relieving Factors unsure                               [facility] Adult PT Treatment/Exercise - 04/02/21 0001       Lumbar Exercises: Standing   Other Standing Lumbar Exercises mini squats 10 x, weight shifts, marching 3#    Other Standing Lumbar Exercises 3# stick bicep curls, 1# stick chest press      Lumbar Exercises: Seated   Other Seated Lumbar Exercises red tband row and extension      Lumbar  Exercises: Supine   Other Supine Lumbar Exercises feet on ball bridge,KTC and obl 10 x each, isometric abs      Knee/Hip Exercises: Standing   Hip Flexion Both;2 sets;5 reps    Hip Flexion Limitations 3#    Hip Abduction Both;2 sets;5 reps    Abduction Limitations no weight      Knee/Hip Exercises: Supine   Short Arc Quad Sets Both;2 sets    Short Arc Quad Sets Limitations 3#    Other Supine Knee/Hip Exercises red tband clam shells                       PT Short Term Goals - 03/21/21 1005       PT SHORT TERM GOAL #1   Title independent with HEP    Status Achieved               PT Long Term Goals - 04/02/21 1511       PT LONG TERM GOAL #1   Title go up and down stairs step over step consistently    Status On-going      PT LONG TERM GOAL #2   Title return to independent gym program    Status On-going      PT LONG TERM GOAL #3   Title walk without device with minimal deviation    Status Partially Met                   Plan - 04/02/21 1458     Clinical Impression Statement Patient continues to have a lot of questions and worries about her pain, she has new c/o pain in the neck and the lateral left knee.  I worked to answer all of her questions.  I continued with some supine exercises, moved to sitting and then some standing, she had some c/o pain but not "bad".  I tried to encourage her to move and stay active.  I quesiton soft tissue issues from the non weight bearing vs is there a true skeletal issue, I do not want to ignore the pain due to her history on the last MRI finding the sacral fracture.    PT Next Visit Plan continue to work on strength and tolerance to activity    Consulted and  Agree with Plan of Care Patient             Patient will benefit from skilled therapeutic intervention in order to improve the following deficits and impairments:  Difficulty walking, Abnormal gait, Cardiopulmonary status limiting activity, Decreased  endurance, Pain, Decreased activity tolerance, Decreased balance, Impaired flexibility, Decreased strength, Decreased mobility  Visit Diagnosis: Muscle weakness (generalized)  Chronic bilateral low back pain without sciatica  Difficulty in walking, not elsewhere classified  Pain in left hip  Cervicalgia     Problem List Patient Active Problem List   Diagnosis Date Noted   Upper respiratory infection 01/15/2018   Postherpetic neuralgia 12/25/2016   Dysesthesia 12/25/2016   Polyneuropathy 12/25/2016   Exposure to influenza 04/01/2016   Dysphagia    Bronchiectasis without acute exacerbation ([place]) 08/30/2015   Bronchiectasis ([place]) 08/14/2015   Chronic rhinitis 08/14/2015   Avascular necrosis of femur head, left ([place]) 04/10/2014   OA (osteoarthritis) of hip 04/10/2014   Painful orthopaedic hardware ([place]) 10/17/2013    Sumner Boast, PT 04/02/2021, 3:12 PM  [place]. [place], [place], [address] Phone: [phone]   Fax:  [phone]  Name: Netra Postlethwait MRN: [id number] Date of Birth: 31-Oct-1945

## 2021-04-04 ENCOUNTER — Ambulatory Visit: Payer: Medicare Other | Admitting: Physical Therapy

## 2021-04-04 ENCOUNTER — Other Ambulatory Visit: Payer: Self-pay

## 2021-04-04 DIAGNOSIS — M6281 Muscle weakness (generalized): Secondary | ICD-10-CM

## 2021-04-04 DIAGNOSIS — M545 Low back pain, unspecified: Secondary | ICD-10-CM

## 2021-04-04 DIAGNOSIS — G8929 Other chronic pain: Secondary | ICD-10-CM

## 2021-04-04 NOTE — Therapy (Signed)
[place]. [place], [place], [address] Phone: [phone]   Fax:  [phone]  Physical Therapy Treatment  Patient Details  Name: Brooke Jimenez MRN: [id number] Date of Birth: 11/09/1945 Referring Provider (PT): Rolena Infante   Encounter Date: 04/04/2021   PT End of Session - 04/04/21 1413     Visit Number 6    Date for PT Re-Evaluation 05/17/21    Authorization Type Medicare    PT Start Time 1400    PT Stop Time 1445    PT Time Calculation (min) 45 min             Past Medical History:  Diagnosis Date   Acne rosacea    Anxiety    Arthritis    arthritis in hip    Bronchiectasis ([place])    Cataract    beginning   Complication of anesthesia    "really sore throat"   Corneal dystrophy    DDD (degenerative disc disease), cervical    and lower back   Diverticulosis    Dysphagia    chronic- please see ultrasound done 4/16 15 in EP[facility]    Dysrhythmia    sometimes patient has extra beats per patient    Esophageal dysmotility    Family history of adverse reaction to anesthesia    mother slow to wake up    GERD (gastroesophageal reflux disease)    under control   Glaucoma    "suspect"   Gout    one finger x1 attack   H/O hiatal hernia    Hearing loss    bilateral due to nerve loss. wears hearing aides   Heart murmur    no problem    History of chicken pox    History of melanoma    melanoma- 1986 and 1997    Hypothyroidism    Low back pain with sciatica    Measles    hx of   Mitral valve prolapse    Nocturia    Osteoporosis    Peripheral neuropathy    feet   PONV (postoperative nausea and vomiting)    PTSD (post-traumatic stress disorder) 2006   RSD (reflex sympathetic dystrophy) 1993   Spider veins    Urinary incontinence    occasional   Varicose veins     Past Surgical History:  Procedure Laterality Date   ESOPHAGEAL MANOMETRY N/A 12/17/2015   Procedure: ESOPHAGEAL MANOMETRY (EM);   Surgeon: Mauri Pole, MD;  [facility];  Service: Endoscopy;  Laterality: N/A;   HARDWARE REMOVAL Left 10/17/2013   Procedure: HARDWARE REMOVAL LEFT HIP;  Surgeon: Gearlean Alf, MD;  [facility];  Service: Orthopedics;  Laterality: Left;   HIP FRACTURE SURGERY Left 2006   3 screws   INSERTION OF MESH  02/08/2014   Procedure: INSERTION OF MESH;  Surgeon: Jackolyn Confer, MD;  [facility];  Service: General;;   melanoma surgery   1986, 1997   PARATHYROIDECTOMY  04/02/15   [place] IMPEDANCE STUDY N/A 12/17/2015   Procedure: [place] IMPEDANCE STUDY;  Surgeon: Mauri Pole, MD;  [facility];  Service: Endoscopy;  Laterality: N/A;   TOTAL HIP ARTHROPLASTY Left 04/10/2014   Procedure: LEFT TOTAL HIP ARTHROPLASTY ANTERIOR APPROACH;  Surgeon: Gearlean Alf, MD;  [facility];  Service: Orthopedics;  Laterality: Left;   UMBILICAL HERNIA REPAIR N/A 02/08/2014   Procedure: HERNIA REPAIR UMBILICAL ADULT WITH MESH;  Surgeon: Jackolyn Confer, MD;  [facility];  Service: General;  Laterality: N/A;    There were no vitals filed for this visit.   Subjective Assessment - 04/04/21 1403     Subjective pretty amazing after last session    Currently in Pain? No/denies                               [facility] Adult PT Treatment/Exercise - 04/04/21 0001       Lumbar Exercises: Seated   Other Seated Lumbar Exercises red tband row and extension      Lumbar Exercises: Supine   Other Supine Lumbar Exercises feet on ball bridge,KTC and obl 10 x each, isometric abs      Knee/Hip Exercises: Seated   Long Arc Quad Strengthening;Both;10 reps;Weights    Long Arc Quad Weight 3 lbs.    Knee/Hip Flexion 10x 3#      Knee/Hip Exercises: Supine   Short Arc Quad Sets Both;2 sets;10 reps    Short Arc Quad Sets Limitations 3#    Other Supine Knee/Hip Exercises red tband clam shells   2 sets 10                      PT Short Term Goals -  03/21/21 1005       PT SHORT TERM GOAL #1   Title independent with HEP    Status Achieved               PT Long Term Goals - 04/02/21 1511       PT LONG TERM GOAL #1   Title go up and down stairs step over step consistently    Status On-going      PT LONG TERM GOAL #2   Title return to independent gym program    Status On-going      PT LONG TERM GOAL #3   Title walk without device with minimal deviation    Status Partially Met                   Plan - 04/04/21 1413     Clinical Impression Statement duplicated last session as pt stated she felt so good "it was amazing"- pt wants to progress but stated since she felt good last time lets stay the same for this session. continue to work on strength with core activation with all activities with cuing    PT Treatment/Interventions ADLs/Self Care Home Management;Cryotherapy;Electrical Stimulation;Iontophoresis 94m/ml Dexamethasone;Moist Heat;Gait training;Stair training;Functional mobility training;Therapeutic activities;Therapeutic exercise;Balance training;Neuromuscular re-education;Manual techniques;Patient/family education    PT Next Visit Plan continue to work on strength and tolerance to activity             Patient will benefit from skilled therapeutic intervention in order to improve the following deficits and impairments:  Difficulty walking, Abnormal gait, Cardiopulmonary status limiting activity, Decreased endurance, Pain, Decreased activity tolerance, Decreased balance, Impaired flexibility, Decreased strength, Decreased mobility  Visit Diagnosis: Muscle weakness (generalized)  Chronic bilateral low back pain without sciatica     Problem List Patient Active Problem List   Diagnosis Date Noted   Upper respiratory infection 01/15/2018   Postherpetic neuralgia 12/25/2016   Dysesthesia 12/25/2016   Polyneuropathy 12/25/2016   Exposure to influenza 04/01/2016   Dysphagia    Bronchiectasis without  acute exacerbation ([place] 08/30/2015   Bronchiectasis ([place] 08/14/2015   Chronic rhinitis 08/14/2015   Av[place] necrosis of femur head, left ([place] 04/10/2014   OA (osteoarthritis) of hip  04/10/2014   Painful orthopaedic hardware ([place]) 10/17/2013    PAYSEUR,ANGIE, PTA 04/04/2021, 2:29 PM  [place]. [place], [place], [address] Phone: [phone]   Fax:  [phone]  Name: Brooke Nimmons MRN: [id number] Date of Birth: 1945-09-13

## 2021-04-09 ENCOUNTER — Encounter: Payer: Self-pay | Admitting: Physical Therapy

## 2021-04-09 ENCOUNTER — Other Ambulatory Visit: Payer: Self-pay

## 2021-04-09 ENCOUNTER — Ambulatory Visit: Payer: Medicare Other | Admitting: Physical Therapy

## 2021-04-09 DIAGNOSIS — M25552 Pain in left hip: Secondary | ICD-10-CM

## 2021-04-09 DIAGNOSIS — M545 Low back pain, unspecified: Secondary | ICD-10-CM

## 2021-04-09 DIAGNOSIS — M6281 Muscle weakness (generalized): Secondary | ICD-10-CM | POA: Diagnosis not present

## 2021-04-09 DIAGNOSIS — R262 Difficulty in walking, not elsewhere classified: Secondary | ICD-10-CM

## 2021-04-09 NOTE — Patient Instructions (Signed)
Access Code: [email or web address] URL: [email or web address] Date: 04/09/2021 Prepared by: Lum Babe  Exercises Seated Hip Flexion March with Ankle Weights - 1 x daily - 7 x weekly - 3 sets - 10 reps - 3 hold Seated Long Arc Quad with Ankle Weight - 1 x daily - 7 x weekly - 3 sets - 10 reps - 3 hold Standing Heel Raise with Support - 1 x daily - 7 x weekly - 3 sets - 10 reps - 3 hold Toe Raises with Counter Support - 1 x daily - 7 x weekly - 3 sets - 10 reps - 3 hold Seated Hip Abduction with Resistance - 1 x daily - 7 x weekly - 3 sets - 10 reps - 3 hold Seated Hip Adduction Isometrics with Ball - 1 x daily - 7 x weekly - 3 sets - 10 reps - 3 hold Standing Knee Flexion AROM with Chair Support - 1 x daily - 7 x weekly - 3 sets - 10 reps - 3 hold Standing March with Counter Support - 1 x daily - 7 x weekly - 3 sets - 10 reps - 3 hold Seated Bicep Curls with Rotation and Dumbbells - 1 x daily - 7 x weekly - 3 sets - 10 reps - 3 hold Seated Shoulder External Rotation with Dumbbells - 1 x daily - 7 x weekly - 3 sets - 10 reps - 3 hold Seated Bilateral Shoulder Scaption with Dumbbells - 1 x daily - 7 x weekly - 3 sets - 10 reps - 3 hold Seated Shoulder Row with Anchored Resistance - 1 x daily - 7 x weekly - 3 sets - 10 reps - 3 hold Seated Shoulder Extension and Scapular Retraction with Resistance - 1 x daily - 7 x weekly - 3 sets - 10 reps - 3 hold Standing Shoulder External Rotation with Resistance - 1 x daily - 7 x weekly - 3 sets - 10 reps - 3 hold Standing Elbow Extension with Self-Anchored Resistance - 1 x daily - 7 x weekly - 3 sets - 10 reps - 3 hold

## 2021-04-09 NOTE — Therapy (Signed)
Brooke Jimenez. [place], [place], [address] Phone: [phone]   Fax:  [phone]  Physical Therapy Treatment  Patient Details  Name: Brooke Jimenez MRN: [id number] Date of Birth: 01-Apr-1945 Referring Provider (PT): Rolena Infante   Encounter Date: 04/09/2021   PT End of Session - 04/09/21 1541     Visit Number 7    PT Start Time 1440    PT Stop Time 1532    PT Time Calculation (min) 52 min    Activity Tolerance Patient tolerated treatment well    Behavior During Therapy [facility] for tasks assessed/performed             Past Medical History:  Diagnosis Date   Acne rosacea    Anxiety    Arthritis    arthritis in hip    Bronchiectasis ([place])    Cataract    beginning   Complication of anesthesia    "really sore throat"   Corneal dystrophy    DDD (degenerative disc disease), cervical    and lower back   Diverticulosis    Dysphagia    chronic- please see ultrasound done 4/16 15 in EPIC    Dysrhythmia    sometimes patient has extra beats per patient    Esophageal dysmotility    Family history of adverse reaction to anesthesia    mother slow to wake up    GERD (gastroesophageal reflux disease)    under control   Glaucoma    "suspect"   Gout    one finger x1 attack   H/O hiatal hernia    Hearing loss    bilateral due to nerve loss. wears hearing aides   Heart murmur    no problem    History of chicken pox    History of melanoma    melanoma- 1986 and 1997    Hypothyroidism    Low back pain with sciatica    Measles    hx of   Mitral valve prolapse    Nocturia    Osteoporosis    Peripheral neuropathy    feet   PONV (postoperative nausea and vomiting)    PTSD (post-traumatic stress disorder) 2006   RSD (reflex sympathetic dystrophy) 1993   Spider veins    Urinary incontinence    occasional   Varicose veins     Past Surgical History:  Procedure Laterality Date   ESOPHAGEAL MANOMETRY N/A 12/17/2015    Procedure: ESOPHAGEAL MANOMETRY (EM);  Surgeon: Mauri Pole, MD;  [facility];  Service: Endoscopy;  Laterality: N/A;   HARDWARE REMOVAL Left 10/17/2013   Procedure: HARDWARE REMOVAL LEFT HIP;  Surgeon: Gearlean Alf, MD;  [facility];  Service: Orthopedics;  Laterality: Left;   HIP FRACTURE SURGERY Left 2006   3 screws   INSERTION OF MESH  02/08/2014   Procedure: INSERTION OF MESH;  Surgeon: Jackolyn Confer, MD;  [facility];  Service: General;;   melanoma surgery   1986, 1997   PARATHYROIDECTOMY  04/02/15   [place] IMPEDANCE STUDY N/A 12/17/2015   Procedure: [place] IMPEDANCE STUDY;  Surgeon: Mauri Pole, MD;  [facility];  Service: Endoscopy;  Laterality: N/A;   TOTAL HIP ARTHROPLASTY Left 04/10/2014   Procedure: LEFT TOTAL HIP ARTHROPLASTY ANTERIOR APPROACH;  Surgeon: Gearlean Alf, MD;  [facility];  Service: Orthopedics;  Laterality: Left;   UMBILICAL HERNIA REPAIR N/A 02/08/2014   Procedure: HERNIA REPAIR UMBILICAL ADULT WITH MESH;  Surgeon: Jackolyn Confer,  MD;  [facility];  Service: General;  Laterality: N/A;    There were no vitals filed for this visit.   Subjective Assessment - 04/09/21 1446     Subjective I am doing pretty well    Currently in Pain? No/denies                                        PT Education - 04/09/21 1540     Education Details gave a very large HEP for her to do during the class that she was trying to do for her pulmonary    Person(s) Educated Patient    Methods Explanation;Demonstration;Tactile cues;Verbal cues;Handout    Comprehension Verbalized understanding;Returned demonstration;Verbal cues required;Tactile cues required;Need further instruction              PT Short Term Goals - 03/21/21 1005       PT SHORT TERM GOAL #1   Title independent with HEP    Status Achieved               PT Long Term Goals - 04/09/21 1557       PT LONG TERM GOAL #1    Title go up and down stairs step over step consistently    Status On-going      PT LONG TERM GOAL #2   Title return to independent gym program    Status On-going      PT LONG TERM GOAL #3   Title walk without device with minimal deviation    Status Partially Met                   Plan - 04/09/21 1542     Clinical Impression Statement gave a very large HEP as she was asking what she could do since she cannot do her class, I showed her how to do each in sitting and standing, I also showed her with and without weight, I cautioned her on few reps and assure she is okay and to stop if she has pain, I asked her to start with no weight at first    PT Next Visit Plan continue to work on strength and tolerance to activity    Consulted and Agree with Plan of Care Patient             Patient will benefit from skilled therapeutic intervention in order to improve the following deficits and impairments:  Difficulty walking, Abnormal gait, Cardiopulmonary status limiting activity, Decreased endurance, Pain, Decreased activity tolerance, Decreased balance, Impaired flexibility, Decreased strength, Decreased mobility  Visit Diagnosis: Muscle weakness (generalized)  Chronic bilateral low back pain without sciatica  Difficulty in walking, not elsewhere classified  Pain in left hip     Problem List Patient Active Problem List   Diagnosis Date Noted   Upper respiratory infection 01/15/2018   Postherpetic neuralgia 12/25/2016   Dysesthesia 12/25/2016   Polyneuropathy 12/25/2016   Exposure to influenza 04/01/2016   Dysphagia    Bronchiectasis without acute exacerbation ([place]) 08/30/2015   Bronchiectasis ([place]) 08/14/2015   Chronic rhinitis 08/14/2015   Avascular necrosis of femur head, left ([place]) 04/10/2014   OA (osteoarthritis) of hip 04/10/2014   Painful orthopaedic hardware ([place]) 10/17/2013    Brooke Jimenez, PT 04/09/2021, 3:58 PM  [place]. [place], [place], [address] Phone: [phone]   Fax:  [phone]  Name: Brooke Jimenez MRN: [id number] Date of Birth: 1945-09-13

## 2021-04-11 ENCOUNTER — Ambulatory Visit: Payer: Medicare Other | Admitting: Physical Therapy

## 2021-04-11 ENCOUNTER — Other Ambulatory Visit: Payer: Self-pay

## 2021-04-11 DIAGNOSIS — M6281 Muscle weakness (generalized): Secondary | ICD-10-CM | POA: Diagnosis not present

## 2021-04-11 DIAGNOSIS — G8929 Other chronic pain: Secondary | ICD-10-CM

## 2021-04-11 DIAGNOSIS — R262 Difficulty in walking, not elsewhere classified: Secondary | ICD-10-CM

## 2021-04-11 DIAGNOSIS — M545 Low back pain, unspecified: Secondary | ICD-10-CM

## 2021-04-11 DIAGNOSIS — M25552 Pain in left hip: Secondary | ICD-10-CM

## 2021-04-11 NOTE — Therapy (Signed)
[facility]. [place], [place], [address] Phone: [phone]   Fax:  [phone]  Physical Therapy Treatment  Patient Details  Name: Brooke Jimenez MRN: [id number] Date of Birth: 09/02/1945 Referring Provider (PT): Rolena Infante   Encounter Date: 04/11/2021   PT End of Session - 04/11/21 1520     Visit Number 8    Date for PT Re-Evaluation 05/17/21    Authorization Type Medicare    PT Start Time 1445    PT Stop Time 1525    PT Time Calculation (min) 40 min             Past Medical History:  Diagnosis Date   Acne rosacea    Anxiety    Arthritis    arthritis in hip    Bronchiectasis ([place])    Cataract    beginning   Complication of anesthesia    "really sore throat"   Corneal dystrophy    DDD (degenerative disc disease), cervical    and lower back   Diverticulosis    Dysphagia    chronic- please see ultrasound done 4/16 15 in EPIC    Dysrhythmia    sometimes patient has extra beats per patient    Esophageal dysmotility    Family history of adverse reaction to anesthesia    mother slow to wake up    GERD (gastroesophageal reflux disease)    under control   Glaucoma    "suspect"   Gout    one finger x1 attack   H/O hiatal hernia    Hearing loss    bilateral due to nerve loss. wears hearing aides   Heart murmur    no problem    History of chicken pox    History of melanoma    melanoma- 1986 and 1997    Hypothyroidism    Low back pain with sciatica    Measles    hx of   Mitral valve prolapse    Nocturia    Osteoporosis    Peripheral neuropathy    feet   PONV (postoperative nausea and vomiting)    PTSD (post-traumatic stress disorder) 2006   RSD (reflex sympathetic dystrophy) 1993   Spider veins    Urinary incontinence    occasional   Varicose veins     Past Surgical History:  Procedure Laterality Date   ESOPHAGEAL MANOMETRY N/A 12/17/2015   Procedure: ESOPHAGEAL MANOMETRY (EM);   Surgeon: Mauri Pole, MD;  [facility];  Service: Endoscopy;  Laterality: N/A;   HARDWARE REMOVAL Left 10/17/2013   Procedure: HARDWARE REMOVAL LEFT HIP;  Surgeon: Gearlean Alf, MD;  [facility];  Service: Orthopedics;  Laterality: Left;   HIP FRACTURE SURGERY Left 2006   3 screws   INSERTION OF MESH  02/08/2014   Procedure: INSERTION OF MESH;  Surgeon: Jackolyn Confer, MD;  [facility];  Service: General;;   melanoma surgery   1986, 1997   PARATHYROIDECTOMY  04/02/15   [place] IMPEDANCE STUDY N/A 12/17/2015   Procedure: [place] IMPEDANCE STUDY;  Surgeon: Mauri Pole, MD;  [facility];  Service: Endoscopy;  Laterality: N/A;   TOTAL HIP ARTHROPLASTY Left 04/10/2014   Procedure: LEFT TOTAL HIP ARTHROPLASTY ANTERIOR APPROACH;  Surgeon: Gearlean Alf, MD;  [facility];  Service: Orthopedics;  Laterality: Left;   UMBILICAL HERNIA REPAIR N/A 02/08/2014   Procedure: HERNIA REPAIR UMBILICAL ADULT WITH MESH;  Surgeon: Jackolyn Confer, MD;  [facility];  Service: General;  Laterality: N/A;    There were no vitals filed for this visit.   Subjective Assessment - 04/11/21 1446     Subjective doing better, doing more, walking better but need to take bigger steps. HEP is good and did in zoom yesterday. on average 7 min on bike    Currently in Pain? No/denies                               [facility] Adult PT Treatment/Exercise - 04/11/21 0001       High Level Balance   High Level Balance Activities Marching forwards;Backward walking;Side stepping   yellow tband     Lumbar Exercises: Aerobic   Nustep L 3 6 min   adjusted seat to increase left hip comfort     Lumbar Exercises: Standing   Row Strengthening;Both;10 reps   reviewed  for HEP   Theraband Level (Row) Level 2 (Red)    Shoulder Extension Strengthening;Both;10 reps    Theraband Level (Shoulder Extension) Level 2 (Red)   reviewed for HEP   Other Standing Lumbar Exercises step  tap 6 inch UE support 10 x RT , 7 x left each then alt 10x   increased SLS on left increased pain, so stopped     Knee/Hip Exercises: Standing   Walking with Sports Cord 10 #x 3 x each fwd, back and each side   c/o RT SI pain                      PT Short Term Goals - 03/21/21 1005       PT SHORT TERM GOAL #1   Title independent with HEP    Status Achieved               PT Long Term Goals - 04/11/21 1518       PT LONG TERM GOAL #1   Title go up and down stairs step over step consistently    Status On-going      PT LONG TERM GOAL #2   Title return to independent gym program    Status Partially Met      PT LONG TERM GOAL #3   Title walk without device with minimal deviation    Status Partially Met      PT LONG TERM GOAL #4   Title report pain decreased 50%    Baseline stronger but significant pain changes    Status On-going      PT LONG TERM GOAL #5   Title decrease TUG time to 13 seconds    Baseline 11.9 sec    Status Achieved                   Plan - 04/11/21 1521     Clinical Impression Statement progressing with goals, verb no significnat pain changes but stronger. pt verb doing HEP and just reviewed scap stab and issued red tband. focus session on ex to pick up feet and walk smoother. answered quetions re: different ex from zoom class    PT Treatment/Interventions ADLs/Self Care Home Management;Cryotherapy;Electrical Stimulation;Iontophoresis 42m/ml Dexamethasone;Moist Heat;Gait training;Stair training;Functional mobility training;Therapeutic activities;Therapeutic exercise;Balance training;Neuromuscular re-education;Manual techniques;Patient/family education    PT Next Visit Plan continue to work on strength and tolerance to activity             Patient will benefit from skilled therapeutic intervention in order to improve the following deficits  and impairments:  Difficulty walking, Abnormal gait, Cardiopulmonary status limiting  activity, Decreased endurance, Pain, Decreased activity tolerance, Decreased balance, Impaired flexibility, Decreased strength, Decreased mobility  Visit Diagnosis: Muscle weakness (generalized)  Chronic bilateral low back pain without sciatica  Difficulty in walking, not elsewhere classified  Pain in left hip     Problem List Patient Active Problem List   Diagnosis Date Noted   Upper respiratory infection 01/15/2018   Postherpetic neuralgia 12/25/2016   Dysesthesia 12/25/2016   Polyneuropathy 12/25/2016   Exposure to influenza 04/01/2016   Dysphagia    Bronchiectasis without acute exacerbation ([place]) 08/30/2015   Bronchiectasis ([place]) 08/14/2015   Chronic rhinitis 08/14/2015   Avascular necrosis of femur head, left ([place]) 04/10/2014   OA (osteoarthritis) of hip 04/10/2014   Painful orthopaedic hardware ([place]) 10/17/2013    Andrea Colglazier,ANGIE, PTA 04/11/2021, 3:25 PM  [place]. [place], [place], [address] Phone: [phone]   Fax:  [phone]  Name: Ciella Obi MRN: [id number] Date of Birth: [birth year]

## 2021-04-16 ENCOUNTER — Ambulatory Visit: Payer: Medicare Other | Admitting: Physical Therapy

## 2021-04-16 ENCOUNTER — Other Ambulatory Visit: Payer: Self-pay

## 2021-04-16 DIAGNOSIS — M6281 Muscle weakness (generalized): Secondary | ICD-10-CM | POA: Diagnosis not present

## 2021-04-16 DIAGNOSIS — M545 Low back pain, unspecified: Secondary | ICD-10-CM

## 2021-04-16 DIAGNOSIS — M25552 Pain in left hip: Secondary | ICD-10-CM

## 2021-04-16 DIAGNOSIS — R262 Difficulty in walking, not elsewhere classified: Secondary | ICD-10-CM

## 2021-04-16 NOTE — Therapy (Signed)
[facility]. [place], [place], [address] Phone: [phone]   Fax:  [phone]  Physical Therapy Treatment  Patient Details  Name: Brooke Jimenez MRN: [id number] Date of Birth: 02-19-1946 Referring Provider (PT): Rolena Infante   Encounter Date: 04/16/2021   PT End of Session - 04/16/21 1544     Visit Number 9    Date for PT Re-Evaluation 05/17/21    Authorization Type Medicare    PT Start Time 1400    PT Stop Time 1443    PT Time Calculation (min) 43 min    Activity Tolerance Patient tolerated treatment well    Behavior During Therapy [facility] for tasks assessed/performed             Past Medical History:  Diagnosis Date   Acne rosacea    Anxiety    Arthritis    arthritis in hip    Bronchiectasis ([place])    Cataract    beginning   Complication of anesthesia    "really sore throat"   Corneal dystrophy    DDD (degenerative disc disease), cervical    and lower back   Diverticulosis    Dysphagia    chronic- please see ultrasound done 4/16 15 in EP[facility]    Dysrhythmia    sometimes patient has extra beats per patient    Esophageal dysmotility    Family history of adverse reaction to anesthesia    mother slow to wake up    GERD (gastroesophageal reflux disease)    under control   Glaucoma    "suspect"   Gout    one finger x1 attack   H/O hiatal hernia    Hearing loss    bilateral due to nerve loss. wears hearing aides   Heart murmur    no problem    History of chicken pox    History of melanoma    melanoma- 1986 and 1997    Hypothyroidism    Low back pain with sciatica    Measles    hx of   Mitral valve prolapse    Nocturia    Osteoporosis    Peripheral neuropathy    feet   PONV (postoperative nausea and vomiting)    PTSD (post-traumatic stress disorder) 2006   RSD (reflex sympathetic dystrophy) 1993   Spider veins    Urinary incontinence    occasional   Varicose veins     Past Surgical  History:  Procedure Laterality Date   ESOPHAGEAL MANOMETRY N/A 12/17/2015   Procedure: ESOPHAGEAL MANOMETRY (EM);  Surgeon: Mauri Pole, MD;  [facility];  Service: Endoscopy;  Laterality: N/A;   HARDWARE REMOVAL Left 10/17/2013   Procedure: HARDWARE REMOVAL LEFT HIP;  Surgeon: Gearlean Alf, MD;  [facility];  Service: Orthopedics;  Laterality: Left;   HIP FRACTURE SURGERY Left 2006   3 screws   INSERTION OF MESH  02/08/2014   Procedure: INSERTION OF MESH;  Surgeon: Jackolyn Confer, MD;  [facility];  Service: General;;   melanoma surgery   1986, 1997   PARATHYROIDECTOMY  04/02/15   [place] IMPEDANCE STUDY N/A 12/17/2015   Procedure: [place] IMPEDANCE STUDY;  Surgeon: Mauri Pole, MD;  [facility];  Service: Endoscopy;  Laterality: N/A;   TOTAL HIP ARTHROPLASTY Left 04/10/2014   Procedure: LEFT TOTAL HIP ARTHROPLASTY ANTERIOR APPROACH;  Surgeon: Gearlean Alf, MD;  [facility];  Service: Orthopedics;  Laterality: Left;   UMBILICAL HERNIA REPAIR N/A  02/08/2014   Procedure: HERNIA REPAIR UMBILICAL ADULT WITH MESH;  Surgeon: Jackolyn Confer, MD;  [facility];  Service: General;  Laterality: N/A;    There were no vitals filed for this visit.   Subjective Assessment - 04/16/21 1407     Subjective Patient reports doing more and doing okay, some pain and still with fatigue    Currently in Pain? No/denies                               [facility] Adult PT Treatment/Exercise - 04/16/21 0001       Lumbar Exercises: Aerobic   Nustep level 6 x 3 minutes and then level 5 x 3 minutes      Lumbar Exercises: Machines for Strengthening   Other Lumbar Machine Exercise seated row 10# 2x10      Lumbar Exercises: Supine   Bridge with Ball Squeeze Compliant;10 reps    Other Supine Lumbar Exercises feet on ball bridge,KTC and obl 10 x each, isometric abs      Knee/Hip Exercises: Stretches   Passive Hamstring Stretch Both;3 reps;10  seconds    Piriformis Stretch Both;3 reps;10 seconds    Gastroc Stretch Both;3 reps;20 seconds      Knee/Hip Exercises: Machines for Strengthening   Cybex Knee Extension 5# 2x10    Cybex Knee Flexion 15# 2x10    Other Machine 5# straight arm pulls cues for posture and core      Knee/Hip Exercises: Supine   Bridges with [employer] 10 reps    Bridges with Clams[employer] 10 reps                       PT Short Term Goals - 03/21/21 1005       PT SHORT TERM GOAL #1   Title independent with HEP    Status Achieved               PT Long Term Goals - 04/16/21 1700       PT LONG TERM GOAL #1   Title go up and down stairs step over step consistently    Status On-going      PT LONG TERM GOAL #2   Title return to independent gym program    Status On-going      PT LONG TERM GOAL #3   Title walk without device with minimal deviation    Status Partially Met                   Plan - 04/16/21 1544     Clinical Impression Statement Patient doing well, I really started some gym activities with light weight, she tolerated well but did report some discomfort at the end of the session.  I asked her to go easy tonight and tomorrow as she recovers from the added exercises    PT Next Visit Plan continue to work on strength and tolerance to activity    Consulted and Agree with Plan of Care Patient             Patient will benefit from skilled therapeutic intervention in order to improve the following deficits and impairments:  Difficulty walking, Abnormal gait, Cardiopulmonary status limiting activity, Decreased endurance, Pain, Decreased activity tolerance, Decreased balance, Impaired flexibility, Decreased strength, Decreased mobility  Visit Diagnosis: Muscle weakness (generalized)  Chronic bilateral low back pain without sciatica  Difficulty in walking, not elsewhere classified  Pain  in left hip     Problem List Patient Active Problem List    Diagnosis Date Noted   Upper respiratory infection 01/15/2018   Postherpetic neuralgia 12/25/2016   Dysesthesia 12/25/2016   Polyneuropathy 12/25/2016   Exposure to influenza 04/01/2016   Dysphagia    Bronchiectasis without acute exacerbation ([place]) 08/30/2015   Bronchiectasis ([place]) 08/14/2015   Chronic rhinitis 08/14/2015   Avascular necrosis of femur head, left ([place]) 04/10/2014   OA (osteoarthritis) of hip 04/10/2014   Painful orthopaedic hardware ([place]) 10/17/2013    Sumner Boast, PT 04/16/2021, 5:01 PM  [place]. [place], [place], [address] Phone: [phone]   Fax:  [phone]  Name: Brooke Jimenez MRN: [id number] Date of Birth: [birth year]

## 2021-04-18 ENCOUNTER — Ambulatory Visit: Payer: Medicare Other | Admitting: Physical Therapy

## 2021-04-18 ENCOUNTER — Other Ambulatory Visit: Payer: Self-pay

## 2021-04-18 DIAGNOSIS — M25552 Pain in left hip: Secondary | ICD-10-CM

## 2021-04-18 DIAGNOSIS — R262 Difficulty in walking, not elsewhere classified: Secondary | ICD-10-CM

## 2021-04-18 DIAGNOSIS — M6281 Muscle weakness (generalized): Secondary | ICD-10-CM | POA: Diagnosis not present

## 2021-04-18 DIAGNOSIS — G8929 Other chronic pain: Secondary | ICD-10-CM

## 2021-04-18 NOTE — Therapy (Signed)
[place]. [place], [place], [address] Phone: [phone]   Fax:  [phone] Progress Note Reporting Period 03/18/21 to 04/18/21  See note below for Objective Data and Assessment of Progress/Goals.     Physical Therapy Treatment  Patient Details  Name: Brooke Jimenez MRN: [id number] Date of Birth: [birth year] Referring Provider (PT): Rolena Infante   Encounter Date: 04/18/2021   PT End of Session - 04/18/21 1509     Visit Number 10    Date for PT Re-Evaluation 05/17/21    Authorization Type Medicare    PT Start Time 1445    PT Stop Time 7544    PT Time Calculation (min) 45 min             Past Medical History:  Diagnosis Date   Acne rosacea    Anxiety    Arthritis    arthritis in hip    Bronchiectasis ([place])    Cataract    beginning   Complication of anesthesia    "really sore throat"   Corneal dystrophy    DDD (degenerative disc disease), cervical    and lower back   Diverticulosis    Dysphagia    chronic- please see ultrasound done 4/16 15 in [facility]    Dysrhythmia    sometimes patient has extra beats per patient    Esophageal dysmotility    Family history of adverse reaction to anesthesia    mother slow to wake up    GERD (gastroesophageal reflux disease)    under control   Glaucoma    "suspect"   Gout    one finger x1 attack   H/O hiatal hernia    Hearing loss    bilateral due to nerve loss. wears hearing aides   Heart murmur    no problem    History of chicken pox    History of melanoma    melanoma- 1986 and 1997    Hypothyroidism    Low back pain with sciatica    Measles    hx of   Mitral valve prolapse    Nocturia    Osteoporosis    Peripheral neuropathy    feet   PONV (postoperative nausea and vomiting)    PTSD (post-traumatic stress disorder) 2006   RSD (reflex sympathetic dystrophy) 1993   Spider veins    Urinary incontinence    occasional   Varicose veins     Past  Surgical History:  Procedure Laterality Date   ESOPHAGEAL MANOMETRY N/A 12/17/2015   Procedure: ESOPHAGEAL MANOMETRY (EM);  Surgeon: Mauri Pole, MD;  Location: [facility];  Service: Endoscopy;  Laterality: N/A;   HARDWARE REMOVAL Left 10/17/2013   Procedure: HARDWARE REMOVAL LEFT HIP;  Surgeon: Gearlean Alf, MD;  Location: [facility];  Service: Orthopedics;  Laterality: Left;   HIP FRACTURE SURGERY Left 2006   3 screws   INSERTION OF MESH  02/08/2014   Procedure: INSERTION OF MESH;  Surgeon: Jackolyn Confer, MD;  Location: [facility];  Service: General;;   melanoma surgery   1986, 1997   PARATHYROIDECTOMY  04/02/15   [place] IMPEDANCE STUDY N/A 12/17/2015   Procedure: [place] IMPEDANCE STUDY;  Surgeon: Mauri Pole, MD;  [facility];  Service: Endoscopy;  Laterality: N/A;   TOTAL HIP ARTHROPLASTY Left 04/10/2014   Procedure: LEFT TOTAL HIP ARTHROPLASTY ANTERIOR APPROACH;  Surgeon: Gearlean Alf, MD;  Location: [facility];  Service: Orthopedics;  Laterality: Left;   UMBILICAL HERNIA  REPAIR N/A 02/08/2014   Procedure: HERNIA REPAIR UMBILICAL ADULT WITH MESH;  Surgeon: Jackolyn Confer, MD;  Location: [facility];  Service: General;  Laterality: N/A;    There were no vitals filed for this visit.   Subjective Assessment - 04/18/21 1450     Subjective sore but okay in LB after all those ex    Pain Score 4     Pain Location Back    Pain Orientation Lower                               [facility] Adult PT Treatment/Exercise - 04/18/21 0001       Lumbar Exercises: Aerobic   Nustep level 6 x 3 minutes and then level 5 x 3 minutes      Lumbar Exercises: Machines for Strengthening   Other Lumbar Machine Exercise seated row 10# 2x10      Lumbar Exercises: Seated   Other Seated Lumbar Exercises green tband trunk flex and ext 2 sets 10      Knee/Hip Exercises: Machines for Strengthening   Cybex Knee Extension 5# 2x10    Cybex Knee Flexion 15# 2x10    Other Machine 5#  straight arm pulls cues for posture and core      Knee/Hip Exercises: Standing   Lateral Step Up Both;5 sets;Hand Hold: 2;Step Height: 4";2 sets    Forward Step Up Both;2 sets;5 sets;Hand Hold: 2;Step Height: 4"                       PT Short Term Goals - 03/21/21 1005       PT SHORT TERM GOAL #1   Title independent with HEP    Status Achieved               PT Long Term Goals - 04/18/21 1508       PT LONG TERM GOAL #1   Title go up and down stairs step over step consistently    Status On-going      PT LONG TERM GOAL #2   Title return to independent gym program    Status On-going      PT LONG TERM GOAL #3   Title walk without device with minimal deviation    Status Partially Met      PT LONG TERM GOAL #4   Title report pain decreased 50%    Status Partially Met                   Plan - 04/18/21 1512     Clinical Impression Statement pt progressing slowly but well with increasing strength and educ on ex as we go , really being mindful of pain.pt at times has a hard time differenciating limitations btwn pain and breathing issues. pt sttaes not doing as much as she was a home before. progressing slowly towards goals    PT Treatment/Interventions ADLs/Self Care Home Management;Cryotherapy;Electrical Stimulation;Iontophoresis 24m/ml Dexamethasone;Moist Heat;Gait training;Stair training;Functional mobility training;Therapeutic activities;Therapeutic exercise;Balance training;Neuromuscular re-education;Manual techniques;Patient/family education    PT Next Visit Plan continue to work on strength and tolerance to activity             Patient will benefit from skilled therapeutic intervention in order to improve the following deficits and impairments:  Difficulty walking, Abnormal gait, Cardiopulmonary status limiting activity, Decreased endurance, Pain, Decreased activity tolerance, Decreased balance, Impaired flexibility, Decreased strength,  Decreased mobility  Visit Diagnosis: Muscle  weakness (generalized)  Chronic bilateral low back pain without sciatica  Pain in left hip  Difficulty in walking, not elsewhere classified     Problem List Patient Active Problem List   Diagnosis Date Noted   Upper respiratory infection 01/15/2018   Postherpetic neuralgia 12/25/2016   Dysesthesia 12/25/2016   Polyneuropathy 12/25/2016   Exposure to influenza 04/01/2016   Dysphagia    Bronchiectasis without acute exacerbation ([place]) 08/30/2015   Bronchiectasis ([place]) 08/14/2015   Chronic rhinitis 08/14/2015   Avascular necrosis of femur head, left ([place]) 04/10/2014   OA (osteoarthritis) of hip 04/10/2014   Painful orthopaedic hardware ([place]) 10/17/2013    Sunshine Mackowski,ANGIE, PTA 04/18/2021, 3:19 PM  [place]. [place], [place], [address] Phone: [phone]   Fax:  [phone]  Name: Brooke Jimenez MRN: [id number] Date of Birth: 07-13-1945

## 2021-04-23 ENCOUNTER — Other Ambulatory Visit: Payer: Self-pay

## 2021-04-23 ENCOUNTER — Ambulatory Visit: Payer: Medicare Other | Admitting: Physical Therapy

## 2021-04-23 DIAGNOSIS — R262 Difficulty in walking, not elsewhere classified: Secondary | ICD-10-CM

## 2021-04-23 DIAGNOSIS — M6281 Muscle weakness (generalized): Secondary | ICD-10-CM

## 2021-04-23 DIAGNOSIS — M25552 Pain in left hip: Secondary | ICD-10-CM

## 2021-04-23 DIAGNOSIS — M545 Low back pain, unspecified: Secondary | ICD-10-CM

## 2021-04-23 DIAGNOSIS — G8929 Other chronic pain: Secondary | ICD-10-CM

## 2021-04-23 NOTE — Therapy (Signed)
[place]. [place], [place], [address] Phone: [phone]   Fax:  [phone]  Physical Therapy Treatment  Patient Details  Name: Brooke Jimenez MRN: [id number] Date of Birth: Nov 08, 1945 Referring Provider (PT): Rolena Infante   Encounter Date: 04/23/2021   PT End of Session - 04/23/21 1401     Visit Number 11    Date for PT Re-Evaluation 05/17/21    Authorization Type Medicare    PT Start Time 1315    PT Stop Time 1400    PT Time Calculation (min) 45 min             Past Medical History:  Diagnosis Date   Acne rosacea    Anxiety    Arthritis    arthritis in hip    Bronchiectasis ([place])    Cataract    beginning   Complication of anesthesia    "really sore throat"   Corneal dystrophy    DDD (degenerative disc disease), cervical    and lower back   Diverticulosis    Dysphagia    chronic- please see ultrasound done 4/16 15 in [facility]    Dysrhythmia    sometimes patient has extra beats per patient    Esophageal dysmotility    Family history of adverse reaction to anesthesia    mother slow to wake up    GERD (gastroesophageal reflux disease)    under control   Glaucoma    "suspect"   Gout    one finger x1 attack   H/O hiatal hernia    Hearing loss    bilateral due to nerve loss. wears hearing aides   Heart murmur    no problem    History of chicken pox    History of melanoma    melanoma- 1986 and 1997    Hypothyroidism    Low back pain with sciatica    Measles    hx of   Mitral valve prolapse    Nocturia    Osteoporosis    Peripheral neuropathy    feet   PONV (postoperative nausea and vomiting)    PTSD (post-traumatic stress disorder) 2006   RSD (reflex sympathetic dystrophy) 1993   Spider veins    Urinary incontinence    occasional   Varicose veins     Past Surgical History:  Procedure Laterality Date   ESOPHAGEAL MANOMETRY N/A 12/17/2015   Procedure: ESOPHAGEAL MANOMETRY (EM);   Surgeon: Mauri Pole, MD;  [facility];  Service: Endoscopy;  Laterality: N/A;   HARDWARE REMOVAL Left 10/17/2013   Procedure: HARDWARE REMOVAL LEFT HIP;  Surgeon: Gearlean Alf, MD;  [facility];  Service: Orthopedics;  Laterality: Left;   HIP FRACTURE SURGERY Left 2006   3 screws   INSERTION OF MESH  02/08/2014   Procedure: INSERTION OF MESH;  Surgeon: Jackolyn Confer, MD;  [facility];  Service: General;;   melanoma surgery   1986, 1997   PARATHYROIDECTOMY  04/02/15   [place] IMPEDANCE STUDY N/A 12/17/2015   Procedure: [place] IMPEDANCE STUDY;  Surgeon: Mauri Pole, MD;  [facility];  Service: Endoscopy;  Laterality: N/A;   TOTAL HIP ARTHROPLASTY Left 04/10/2014   Procedure: LEFT TOTAL HIP ARTHROPLASTY ANTERIOR APPROACH;  Surgeon: Gearlean Alf, MD;  [facility];  Service: Orthopedics;  Laterality: Left;   UMBILICAL HERNIA REPAIR N/A 02/08/2014   Procedure: HERNIA REPAIR UMBILICAL ADULT WITH MESH;  Surgeon: Jackolyn Confer, MD;  [facility];  Service: General;  Laterality: N/A;    There were no vitals filed for this visit.   Subjective Assessment - 04/23/21 1320     Subjective increased LBP pull after bending over to check expiration date on gallon bottle    Currently in Pain? Yes    Pain Score 5     Pain Location Back                               [facility] Adult PT Treatment/Exercise - 04/23/21 0001       Lumbar Exercises: Aerobic   Nustep L 5 6 min      Lumbar Exercises: Machines for Strengthening   Cybex Lumbar Extension black tband flex and ext 2 sets 10    Other Lumbar Machine Exercise seated row and lat pull 15# 2x10   only 1 set on lats and modified ROM d/t neck issues     Lumbar Exercises: Seated   Other Seated Lumbar Exercises core stab      Lumbar Exercises: Supine   Straight Leg Raise 10 reps;3 seconds   with abd   Other Supine Lumbar Exercises 10 min core ex                        PT Short Term Goals - 03/21/21 1005       PT SHORT TERM GOAL #1   Title independent with HEP    Status Achieved               PT Long Term Goals - 04/18/21 1508       PT LONG TERM GOAL #1   Title go up and down stairs step over step consistently    Status On-going      PT LONG TERM GOAL #2   Title return to independent gym program    Status On-going      PT LONG TERM GOAL #3   Title walk without device with minimal deviation    Status Partially Met      PT LONG TERM GOAL #4   Title report pain decreased 50%    Status Partially Met                   Plan - 04/23/21 1401     Clinical Impression Statement focus was on postural and core stab with cuing. multi c/o aches and pains today- arrived with "pulled" LB and c/o left hip pain. cautioned pt about  overdoing    PT Treatment/Interventions ADLs/Self Care Home Management;Cryotherapy;Electrical Stimulation;Iontophoresis 76m/ml Dexamethasone;Moist Heat;Gait training;Stair training;Functional mobility training;Therapeutic activities;Therapeutic exercise;Balance training;Neuromuscular re-education;Manual techniques;Patient/family education    PT Next Visit Plan continue to work on strength and tolerance to activity             Patient will benefit from skilled therapeutic intervention in order to improve the following deficits and impairments:  Difficulty walking, Abnormal gait, Cardiopulmonary status limiting activity, Decreased endurance, Pain, Decreased activity tolerance, Decreased balance, Impaired flexibility, Decreased strength, Decreased mobility  Visit Diagnosis: Muscle weakness (generalized)  Chronic bilateral low back pain without sciatica  Pain in left hip  Difficulty in walking, not elsewhere classified     Problem List Patient Active Problem List   Diagnosis Date Noted   Upper respiratory infection 01/15/2018   Postherpetic neuralgia 12/25/2016   Dysesthesia 12/25/2016    Polyneuropathy 12/25/2016   Exposure to influenza 04/01/2016   Dysphagia  Bronchiectasis without acute exacerbation ([place]) 08/30/2015   Bronchiectasis ([place]) 08/14/2015   Chronic rhinitis 08/14/2015   Avascular necrosis of femur head, left ([place]) 04/10/2014   OA (osteoarthritis) of hip 04/10/2014   Painful orthopaedic hardware ([place]) 10/17/2013    [place] Que,ANGIE, PTA 04/23/2021, 2:03 PM  [place]. [place], [place], [address] Phone: [phone]   Fax:  [phone]  Name: Brooke Jimenez MRN: [id number] Date of Birth: November 12, 1945

## 2021-04-24 ENCOUNTER — Ambulatory Visit: Payer: Medicare Other | Attending: Orthopedic Surgery | Admitting: Physical Therapy

## 2021-04-24 DIAGNOSIS — R600 Localized edema: Secondary | ICD-10-CM | POA: Insufficient documentation

## 2021-04-24 DIAGNOSIS — M25572 Pain in left ankle and joints of left foot: Secondary | ICD-10-CM | POA: Insufficient documentation

## 2021-04-24 DIAGNOSIS — G8929 Other chronic pain: Secondary | ICD-10-CM | POA: Insufficient documentation

## 2021-04-24 DIAGNOSIS — M6281 Muscle weakness (generalized): Secondary | ICD-10-CM | POA: Insufficient documentation

## 2021-04-24 DIAGNOSIS — M25552 Pain in left hip: Secondary | ICD-10-CM | POA: Insufficient documentation

## 2021-04-24 DIAGNOSIS — M545 Low back pain, unspecified: Secondary | ICD-10-CM | POA: Insufficient documentation

## 2021-04-24 DIAGNOSIS — R262 Difficulty in walking, not elsewhere classified: Secondary | ICD-10-CM | POA: Insufficient documentation

## 2021-04-29 ENCOUNTER — Ambulatory Visit (INDEPENDENT_AMBULATORY_CARE_PROVIDER_SITE_OTHER): Payer: Medicare Other | Admitting: Neurology

## 2021-04-29 ENCOUNTER — Encounter: Payer: Self-pay | Admitting: Neurology

## 2021-04-29 VITALS — BP 159/91 | HR 75 | Ht 63.5 in | Wt 180.0 lb

## 2021-04-29 DIAGNOSIS — R2689 Other abnormalities of gait and mobility: Secondary | ICD-10-CM | POA: Diagnosis not present

## 2021-04-29 DIAGNOSIS — R200 Anesthesia of skin: Secondary | ICD-10-CM | POA: Diagnosis not present

## 2021-04-29 DIAGNOSIS — B0229 Other postherpetic nervous system involvement: Secondary | ICD-10-CM | POA: Diagnosis not present

## 2021-04-29 NOTE — Patient Instructions (Signed)
ALPHA LIPOIC ACID 600 mg twice daily ?

## 2021-04-29 NOTE — Progress Notes (Signed)
[facility]  PATIENT: Brooke Jimenez DOB: 1945-05-28  REFERRING DOCTOR OR PCP:  Adline Mango, MD SOURCE: Patient and notes from Dr. [name]  _________________________________   HISTORICAL  CHIEF COMPLAINT:  Chief Complaint  Patient presents with   New Patient (Initial Visit)    Pt alone, rm 1, pt presents today as new pt for Postherpetic neuralgia, she states that she would like to update on immunoglobin infusions. Peripheral neuropathy in feet bilaterally has worsened. It is intermittent come and goes. She has had a rash on LLE since march 2022. She feels numbness that comes up leg. She has noticed slight tremor in hand which has been ongoing for couple years. Recently started PT to help with lower sacral compression    HISTORY OF PRESENT ILLNESS:  I had the pleasure of seeing your patient, Brooke Jimenez, at [facility] for a consultation regarding her post herpetic neuralgia pain and polyneuropathy.  She is a 76 y.o. woman who developed shingles in 2018.   Around 10/13/2016, she had the onset of a left flank rash going towards the groin that was associated with an intense burning pain.  The pain felt deeper inside of her abdomen and at first diverticulitis was considered (she has diverticulosis) but CT scan did not verify.    She does still have some deeper pain but this is better.    The shingles pain has improved but is still intense.  Currently, her biggest complaint is numbness in her feet associated with dysesthetic pain.  She first had numbness in her feet more than a decade ago and she feels that has slowly progressed.     We did NCV/EMG in 2018 and she had no evidence of polyneuropathy.       At times, her feet turn purple but are not cold.    She has edema in her legs.   This happens sometimes when she takes showers.   She has a 5 cm hard darker spot at ankle that peels some skin at times.      She had seen vascular surgery (referred by  podiatry) and she reports being told it was not a vascular.   She was diagnosed with RSD in hr right foot many years ago.  She saw Podiatry in 2021 and may need bunionectomy.     Several  different medications were tried for her dysesthesias.   Tylenol at 4000 mg / day caused LFT elevation.   She started with gabapentin 200 mg po bid and then 200-100-200 and eventually up to 1500 mg daily. The high dose made her drowsy.  I last  her in 2018 and adjusted the gabapentin dose and had he try topical lidocaine.     She is still experiencing a squeezing sensation in the left flank and ribs.      She has seen GI and no GI source of this type of pain was identified.   The squeezing sensation comes in waves or if she turns over in bed.    She has sacral insufficiency fracture.    Before that she was diagnosed wih piriformis syndrome and did PT without benefit.   She is otherwise fairly healthy except for bronchiectasis and history of left hip fracture with painful hardware removal a few years later.      NCV/EMG in 2018 showed: 1.    There was no electrophysiologic evidence of a sensory, motor or autonomic polyneuropathy. 2.     There was no significant radiculopathy.  A minimal S1 radiculopathy on the right cannot be ruled out.  She has bronchiectasis and sees a specialist at [facility].   She has low IgM (we did SPEP in 2018 and [facility] confirmed 2022).    There was no paraprotein when checked in 2018.  She sees an Engineer, structural at [facility].      REVIEW OF SYSTEMS: Constitutional: No fevers, chills, sweats, or change in appetite.   Insomnia Eyes: No visual changes, double vision, eye pain Ear, nose and throat: No hearing loss, ear pain, nasal congestion, sore throat Cardiovascular: No chest pain, palpitations Respiratory:  No shortness of breath at rest or with exertion.   No wheezes GastrointestinaI: No nausea, vomiting, diarrhea, abdominal pain, fecal incontinence Genitourinary:  No dysuria, urinary retention or  frequency.  No nocturia. Musculoskeletal:  No neck pain, back pain Integumentary: No rash, pruritus, skin lesions Neurological: as above Psychiatric: No depression at this time.  No anxiety Endocrine: No palpitations, diaphoresis, change in appetite, change in weigh or increased thirst Hematologic/Lymphatic:  No anemia, purpura, petechiae. Allergic/Immunologic: No itchy/runny eyes, nasal congestion, recent allergic reactions, rashes  ALLERGIES: Allergies  Allergen Reactions   Codeine Nausea And Vomiting and Other (See Comments)    DILAUDID AND TRAMADOL ARE OKAY.  Vomiting DILAUDID AND TRAMADOL ARE OKAY.  Vomiting DILAUDID AND TRAMADOL ARE OKAY.     Levofloxacin Nausea Only, Nausea And Vomiting and Other (See Comments)    Vomiting Vomiting    Levaquin [Levofloxacin In D5w] Nausea And Vomiting   Neosporin  [Neomycin-Bacitracin Zn-Polymyx]     Other reaction(s): Other (See Comments) Blisters, Erythema   Adhesive [Tape] Rash   Epinephrine Nausea Only and Other (See Comments)    Heart pounds very hard Heart pounds very hard Heart pounds very hard Heart pounds very hard Heart pounds very hard    Erythromycin Other (See Comments), Nausea Only and Nausea And Vomiting    Abdominal pain  Abdominal pain  Abdominal pain  Abdominal pain    Guaifenesin Nausea Only   Minocycline Dermatitis    Skin discoloration on ankles per pt Skin discoloration on ankles per pt, but has had it since   Nickel Rash   Nsaids     Stomach aches   Sulfa Antibiotics Rash    HOME MEDICATIONS:  Current Outpatient Medications:    calcium carbonate (TUMS - DOSED IN MG ELEMENTAL CALCIUM) 500 MG chewable tablet, Chew 3 tablets by mouth daily as needed for indigestion or heartburn. , Disp: , Rfl:    clindamycin (CLEOCIN) 150 MG capsule, Take 600 mg by mouth. One hour before dental appointments, Disp: , Rfl:    doxycycline (VIBRA-TABS) 100 MG tablet, Take 1 tablet (100 mg total) by mouth 2 (two) times  daily., Disp: 20 tablet, Rfl: 0   levothyroxine (SYNTHROID, LEVOTHROID) 75 MCG tablet, Take 75 mcg by mouth daily before breakfast., Disp: , Rfl:    Multiple Vitamin (MULTIVITAMIN WITH MINERALS) TABS tablet, Take 1 tablet by mouth daily., Disp: , Rfl:    sodium chloride (MURO 128) 5 % ophthalmic ointment, Muro 128 5 % eye ointment, Disp: , Rfl:   PAST MEDICAL HISTORY: Past Medical History:  Diagnosis Date   Acne rosacea    Anxiety    Arthritis    arthritis in hip    Bronchiectasis ([place])    Cataract    beginning   Complication of anesthesia    "really sore throat"   Corneal dystrophy    DDD (degenerative disc disease), cervical  and lower back   Diverticulosis    Dysphagia    chronic- please see ultrasound done 4/16 15 in EPIC    Dysrhythmia    sometimes patient has extra beats per patient    Esophageal dysmotility    Family history of adverse reaction to anesthesia    mother slow to wake up    GERD (gastroesophageal reflux disease)    under control   Glaucoma    "suspect"   Gout    one finger x1 attack   H/O hiatal hernia    Hearing loss    bilateral due to nerve loss. wears hearing aides   Heart murmur    no problem    History of chicken pox    History of melanoma    melanoma- 1986 and 1997    Hypothyroidism    Low back pain with sciatica    Measles    hx of   Mitral valve prolapse    Nocturia    Osteoporosis    Peripheral neuropathy    feet   PONV (postoperative nausea and vomiting)    PTSD (post-traumatic stress disorder) 2006   RSD (reflex sympathetic dystrophy) 1993   Spider veins    Urinary incontinence    occasional   Varicose veins     PAST SURGICAL HISTORY: Past Surgical History:  Procedure Laterality Date   ESOPHAGEAL MANOMETRY N/A 12/17/2015   Procedure: ESOPHAGEAL MANOMETRY (EM);  Surgeon: Mauri Pole, MD;  Location: [facility];  Service: Endoscopy;  Laterality: N/A;   HARDWARE REMOVAL Left 10/17/2013   Procedure: HARDWARE  REMOVAL LEFT HIP;  Surgeon: Gearlean Alf, MD;  Location: [facility];  Service: Orthopedics;  Laterality: Left;   HIP FRACTURE SURGERY Left 2006   3 screws   INSERTION OF MESH  02/08/2014   Procedure: INSERTION OF MESH;  Surgeon: Jackolyn Confer, MD;  Location: [facility];  Service: General;;   melanoma surgery   1986, 1997   PARATHYROIDECTOMY  04/02/15   [place] IMPEDANCE STUDY N/A 12/17/2015   Procedure: [place] IMPEDANCE STUDY;  Surgeon: Mauri Pole, MD;  [facility];  Service: Endoscopy;  Laterality: N/A;   TOTAL HIP ARTHROPLASTY Left 04/10/2014   Procedure: LEFT TOTAL HIP ARTHROPLASTY ANTERIOR APPROACH;  Surgeon: Gearlean Alf, MD;  [facility];  Service: Orthopedics;  Laterality: Left;   UMBILICAL HERNIA REPAIR N/A 02/08/2014   Procedure: HERNIA REPAIR UMBILICAL ADULT WITH MESH;  Surgeon: Jackolyn Confer, MD;  [facility];  Service: General;  Laterality: N/A;    FAMILY HISTORY: Family History  Problem Relation Age of Onset   Cancer Mother    Hypothyroidism Mother    Congestive Heart Failure Mother    Hypertension Mother    Heart failure Father     SOCIAL HISTORY:  Social History   Socioeconomic History   Marital status: Married    Spouse name: Not on file   Number of children: Not on file   Years of education: Not on file   Highest education level: Not on file  Occupational History   Not on file  Tobacco Use   Smoking status: Never   Smokeless tobacco: Never  Substance and Sexual Activity   Alcohol use: No    Alcohol/week: 0.0 standard drinks   Drug use: No   Sexual activity: Not on file  Other Topics Concern   Not on file  Social History Narrative   Not on file   Social Determinants of Health   Financial  Resource Strain: Not on file  Food Insecurity: Not on file  Transportation Needs: Not on file  Physical Activity: Not on file  Stress: Not on file  Social Connections: Not on file  Intimate Partner Violence: Not on file     PHYSICAL  EXAM  Vitals:   04/29/21 1037  BP: (!) 159/91  Pulse: 75  Weight: 180 lb (81.6 kg)  Height: 5' 3.5" (1.613 m)     Body mass index is 31.39 kg/m.   General: The patient is well-developed and well-nourished and in no acute distress   Neck: The neck is supple, no carotid bruits are noted.  The neck is nontender.  Cardiovascular: The heart has a regular rate and rhythm with a normal S1 and S2. There were no murmurs, gallops or rubs. Lungs are clear to auscultation.  Skin/legs: No truncal rash.   She has edema, left > right at ankles, indurated area medial ankle on left.  Pulses are present in feet.      Musculoskeletal:  Back is nontender  Neurologic Exam  Mental status: The patient is alert and oriented x 3 at the time of the examination. The patient has apparent normal recent and remote memory, with an apparently normal attention span and concentration ability.   Speech is normal.  Cranial nerves: Extraocular movements are full. Facial strength and sensation are normal.  . No obvious hearing deficits are noted.  Motor:  Muscle bulk is normal.   Tone is normal. Strength is  5 / 5 in all 4 extremities.   Sensory: She has reduced sensation to vibration at the ankles and more severe reduction of vibration sensation in the toes.  Sensation to touch and pain are fairly normal in the feet and arms, possibly with mild hyperpathia at the toes.   Currently she just has slight allodynia over the skin of the left flank that had been involved with shingles in 2018.  This is not painful.  Coordination: Cerebellar testing reveals good finger-nose-finger and heel-to-shin bilaterally.  Gait and station: Station is normal.   Gait is normal. Tandem gait is mildly wide. Romberg is negative.   Reflexes: Deep tendon reflexes are symmetric and normal bilaterally, including ankles.   Plantar responses are flexor.    DIAGNOSTIC DATA (LABS, IMAGING, TESTING) - I reviewed patient records, labs, notes,  testing and imaging myself where available.  Lab Results  Component Value Date   WBC 6.3 10/13/2016   HGB 14.1 10/13/2016   HCT 41.2 10/13/2016   MCV 88.2 10/13/2016   PLT 219 10/13/2016      Component Value Date/Time   NA 136 10/13/2016 1307   K 3.4 (L) 10/13/2016 1307   CL 102 10/13/2016 1307   CO2 25 10/13/2016 1307   GLUCOSE 107 (H) 10/13/2016 1307   BUN 7 10/13/2016 1307   CREATININE 0.85 10/13/2016 1307   CALCIUM 9.0 10/13/2016 1307   PROT 6.9 12/25/2016 1108   ALBUMIN 4.4 10/13/2016 1307   AST 33 10/13/2016 1307   ALT 26 10/13/2016 1307   ALKPHOS 40 10/13/2016 1307   BILITOT 0.6 10/13/2016 1307   GFRNONAA >60 10/13/2016 1307   GFRAA >60 10/13/2016 1307       ASSESSMENT AND PLAN  Numbness - Plan: Multiple Myeloma Panel (SPEP&IFE w/QIG), Sjogren's syndrome antibods(ssa + ssb), Vitamin B12, Cryoglobulin, Sedimentation rate, C-reactive protein, Rheumatoid factor  Balance disorder - Plan: Multiple Myeloma Panel (SPEP&IFE w/QIG), Sjogren's syndrome antibods(ssa + ssb), Vitamin B12, Cryoglobulin, Sedimentation rate, C-reactive protein, Rheumatoid  factor  Postherpetic neuralgia   In summary, Brooke Jimenez is a 76 year old woman with a history of postherpetic neuralgia and numbness in her feet.  Although she was diagnosed with polyneuropathy many years ago when she started to have numbness in her feet, we did an NCV/EMG study in 2018 and it did not show evidence of polyneuropathy.  She has normal reflexes in her legs.   Symptoms of numbness are just mildly more now than they were in 2018 with more findings on exam.      On exam she has numbness in feet worrisome for polyneuropathy.   However, with similar symptoms, NCV/EMG showed no polyneuropathy in 2018 so etiology is uncertain.  . We will check blood work for review reversible causes of polyneuropathy.    If symptoms worsen, we will recheck NCV/EMG.  Alpha lipoic acid 600 mg po bid.  If no better,  consider lamotrigine.   She would prefer not to do a prescription medication at this time. If balance worsens, we will check MRI cervical to assess for myelopathy/spinal stenosis. Rtc 4 months   Brooke Merida A. Felecia Shelling, MD, [facility] 11/02/2424, 83:41 AM Certified in Neurology, Clinical Neurophysiology, Sleep Medicine, Pain Medicine and Neuroimaging  [facility] [address], [place], [place] [address] [phone]

## 2021-04-30 ENCOUNTER — Encounter: Payer: Self-pay | Admitting: Physical Therapy

## 2021-04-30 ENCOUNTER — Other Ambulatory Visit: Payer: Self-pay

## 2021-04-30 ENCOUNTER — Ambulatory Visit: Payer: Medicare Other | Admitting: Physical Therapy

## 2021-04-30 DIAGNOSIS — M25572 Pain in left ankle and joints of left foot: Secondary | ICD-10-CM | POA: Diagnosis present

## 2021-04-30 DIAGNOSIS — M545 Low back pain, unspecified: Secondary | ICD-10-CM

## 2021-04-30 DIAGNOSIS — M25552 Pain in left hip: Secondary | ICD-10-CM

## 2021-04-30 DIAGNOSIS — G8929 Other chronic pain: Secondary | ICD-10-CM

## 2021-04-30 DIAGNOSIS — M6281 Muscle weakness (generalized): Secondary | ICD-10-CM

## 2021-04-30 DIAGNOSIS — R600 Localized edema: Secondary | ICD-10-CM | POA: Diagnosis present

## 2021-04-30 DIAGNOSIS — R262 Difficulty in walking, not elsewhere classified: Secondary | ICD-10-CM | POA: Diagnosis present

## 2021-04-30 NOTE — Therapy (Signed)
[facility] [address] ?[place], [place], [address] ?Phone: [phone]   Fax:  [phone] ? ?Physical Therapy Treatment ? ?Patient Details  ?Name: Brooke Jimenez ?MRN: [id number] ?Date of Birth: Mar 04, 1945 ?Referring Provider (PT): Rolena Infante ? ? ?Encounter Date: 04/30/2021 ? ? PT End of Session - 04/30/21 1609   ? ? Visit Number 12   ? Date for PT Re-Evaluation 05/17/21   ? Authorization Type Medicare   ? PT Start Time 1400   ? PT Stop Time 8099   ? PT Time Calculation (min) 45 min   ? Activity Tolerance Patient tolerated treatment well   ? Behavior During Therapy [facility] for tasks assessed/performed   ? ?  ?  ? ?  ? ? ?Past Medical History:  ?Diagnosis Date  ? Acne rosacea   ? Anxiety   ? Arthritis   ? arthritis in hip   ? Bronchiectasis ([place])   ? Cataract   ? beginning  ? Complication of anesthesia   ? "really sore throat"  ? Corneal dystrophy   ? DDD (degenerative disc disease), cervical   ? and lower back  ? Diverticulosis   ? Dysphagia   ? chronic- please see ultrasound done 4/16 15 in EPIC   ? Dysrhythmia   ? sometimes patient has extra beats per patient   ? Esophageal dysmotility   ? Family history of adverse reaction to anesthesia   ? mother slow to wake up   ? GERD (gastroesophageal reflux disease)   ? under control  ? Glaucoma   ? "suspect"  ? Gout   ? one finger x1 attack  ? H/O hiatal hernia   ? Hearing loss   ? bilateral due to nerve loss. wears hearing aides  ? Heart murmur   ? no problem   ? History of chicken pox   ? History of melanoma   ? melanoma- 1986 and 1997   ? Hypothyroidism   ? Low back pain with sciatica   ? Measles   ? hx of  ? Mitral valve prolapse   ? Nocturia   ? Osteoporosis   ? Peripheral neuropathy   ? feet  ? PONV (postoperative nausea and vomiting)   ? PTSD (post-traumatic stress disorder) 2006  ? RSD (reflex sympathetic dystrophy) 1993  ? Spider veins   ? Urinary incontinence   ? occasional  ? Varicose veins   ? ? ?Past Surgical  History:  ?Procedure Laterality Date  ? ESOPHAGEAL MANOMETRY N/A 12/17/2015  ? Procedure: ESOPHAGEAL MANOMETRY (EM);  Surgeon: Mauri Pole, MD;  [facility];  Service: Endoscopy;  Laterality: N/A;  ? HARDWARE REMOVAL Left 10/17/2013  ? Procedure: HARDWARE REMOVAL LEFT HIP;  Surgeon: Gearlean Alf, MD;  [facility];  Service: Orthopedics;  Laterality: Left;  ? HIP FRACTURE SURGERY Left 2006  ? 3 screws  ? INSERTION OF MESH  02/08/2014  ? Procedure: INSERTION OF MESH;  Surgeon: Jackolyn Confer, MD;  [facility];  Service: General;;  ? melanoma surgery   1986, 1997  ? PARATHYROIDECTOMY  04/02/15  ? [place] IMPEDANCE STUDY N/A 12/17/2015  ? Procedure: [place] IMPEDANCE STUDY;  Surgeon: Mauri Pole, MD;  [facility];  Service: Endoscopy;  Laterality: N/A;  ? TOTAL HIP ARTHROPLASTY Left 04/10/2014  ? Procedure: LEFT TOTAL HIP ARTHROPLASTY ANTERIOR APPROACH;  Surgeon: Gearlean Alf, MD;  [facility];  Service: Orthopedics;  Laterality: Left;  ? UMBILICAL HERNIA REPAIR N/A  02/08/2014  ? Procedure: HERNIA REPAIR UMBILICAL ADULT WITH MESH;  Surgeon: Jackolyn Confer, MD;  [facility];  Service: General;  Laterality: N/A;  ? ? ?There were no vitals filed for this visit. ? ? Subjective Assessment - 04/30/21 1436   ? ? Subjective A little sore, I missed last week.  Did not do much exercise   ? Currently in Pain? Yes   ? Pain Score 4    ? Pain Location Hip   ? Pain Orientation Left   ? Pain Descriptors / Indicators Sore   ? Aggravating Factors  standing walking   ? ?  ?  ? ?  ? ? ? ? ? ? ? ? ? ? ? ? ? ? ? ? ? ? ? ? [facility] Adult PT Treatment/Exercise - 04/30/21 0001   ? ?  ? Ambulation/Gait  ? Gait Comments walking ball toss 2 laps this was very diffiuclkt for her   ?  ? High Level Balance  ? High Level Balance Activities Tandem walking   ? High Level Balance Comments SLS   ?  ? Lumbar Exercises: Supine  ? Pelvic Tilt 15 reps   ? Clam 15 reps   ? Bent Knee Raise 15 reps   ? Straight Leg  Raise 15 reps   ?  ? Knee/Hip Exercises: Stretches  ? Passive Hamstring Stretch Both;3 reps;10 seconds   ?  ? Knee/Hip Exercises: Machines for Strengthening  ? Cybex Knee Extension 5# 2x10   ? Cybex Knee Flexion 15# 2x10   ? ?  ?  ? ?  ? ? ? ? ? ? ? ? ? ? ? ? PT Short Term Goals - 03/21/21 1005   ? ?  ? PT SHORT TERM GOAL #1  ? Title independent with HEP   ? Status Achieved   ? ?  ?  ? ?  ? ? ? ? PT Long Term Goals - 04/30/21 1613   ? ?  ? PT LONG TERM GOAL #1  ? Title go up and down stairs step over step consistently   ? Status On-going   ?  ? PT LONG TERM GOAL #2  ? Title return to independent gym program   ? Status On-going   ? ?  ?  ? ?  ? ? ? ? ? ? ? ? Plan - 04/30/21 1609   ? ? Clinical Impression Statement Patient continues to have a lot of questions about the MRI and what she can and cannot do, I told her I gave her a lot of exercises and she can pick and chose things, and that she just needs to do some on a daily basis.  She will be in the car a long period of time tomorrow, gave her a few things to do while in the car, she had issues with trying to activate the core, she was using her neck and started c/o neck pain, I did some balance activity and she had difficulty with tandem and SLS, she really was fatigued with walking 200 feet with ball toss, took some time to recover   ? PT Next Visit Plan continue to work on her core and function   ? Consulted and Agree with Plan of Care Patient   ? ?  ?  ? ?  ? ? ?Patient will benefit from skilled therapeutic intervention in order to improve the following deficits and impairments:  Difficulty walking, Abnormal gait, Cardiopulmonary status limiting activity, Decreased endurance,  Pain, Decreased activity tolerance, Decreased balance, Impaired flexibility, Decreased strength, Decreased mobility ? ?Visit Diagnosis: ?Muscle weakness (generalized) ? ?Chronic bilateral low back pain without sciatica ? ?Pain in left hip ? ?Difficulty in walking, not elsewhere  classified ? ? ? ? ?Problem List ?Patient Active Problem List  ? Diagnosis Date Noted  ? Upper respiratory infection 01/15/2018  ? Postherpetic neuralgia 12/25/2016  ? Dysesthesia 12/25/2016  ? Polyneuropathy 12/25/2016  ? Exposure to influenza 04/01/2016  ? Dysphagia   ? Bronchiectasis without acute exacerbation ([place]) 08/30/2015  ? Bronchiectasis ([place]) 08/14/2015  ? Chronic rhinitis 08/14/2015  ? Avascular necrosis of femur head, left ([place]) 04/10/2014  ? OA (osteoarthritis) of hip 04/10/2014  ? Painful orthopaedic hardware [facility]) 10/17/2013  ? ? Sumner Boast, PT ?04/30/2021, 4:14 PM ? ?[facility] ?[address] ?[address] ?[place], [place], [address] ?Phone: [phone]   Fax:  [phone] ? ?Name: Brooke Jimenez ?MRN: [id number] ?Date of Birth: 07-16-1945 ? ? ? ?

## 2021-05-02 ENCOUNTER — Ambulatory Visit: Payer: Medicare Other | Admitting: Physical Therapy

## 2021-05-03 LAB — MULTIPLE MYELOMA PANEL, SERUM
Albumin SerPl Elph-Mcnc: 4 g/dL (ref 2.9–4.4)
Albumin/Glob SerPl: 1.4 (ref 0.7–1.7)
Alpha 1: 0.3 g/dL (ref 0.0–0.4)
Alpha2 Glob SerPl Elph-Mcnc: 0.7 g/dL (ref 0.4–1.0)
B-Globulin SerPl Elph-Mcnc: 1 g/dL (ref 0.7–1.3)
Gamma Glob SerPl Elph-Mcnc: 0.9 g/dL (ref 0.4–1.8)
Globulin, Total: 2.9 g/dL (ref 2.2–3.9)
IgA/Immunoglobulin A, Serum: 207 mg/dL (ref 64–422)
IgG (Immunoglobin G), Serum: 907 mg/dL (ref 586–1602)
IgM (Immunoglobulin M), Srm: 15 mg/dL — ABNORMAL LOW (ref 26–217)
Total Protein: 6.9 g/dL (ref 6.0–8.5)

## 2021-05-03 LAB — CRYOGLOBULIN

## 2021-05-03 LAB — SJOGREN'S SYNDROME ANTIBODS(SSA + SSB)
ENA SSA (RO) Ab: >8 AI — ABNORMAL HIGH (ref 0.0–0.9)
ENA SSB (LA) Ab: <0.2 AI (ref 0.0–0.9)

## 2021-05-03 LAB — C-REACTIVE PROTEIN: CRP: 2 mg/L (ref 0–10)

## 2021-05-03 LAB — RHEUMATOID FACTOR: Rheumatoid fact SerPl-aCnc: <10 IU/mL (ref <14.0)

## 2021-05-03 LAB — VITAMIN B12: Vitamin B-12: 518 pg/mL (ref 232–1245)

## 2021-05-06 ENCOUNTER — Other Ambulatory Visit: Payer: Self-pay | Admitting: Neurology

## 2021-05-06 ENCOUNTER — Telehealth: Payer: Self-pay | Admitting: Neurology

## 2021-05-06 DIAGNOSIS — G629 Polyneuropathy, unspecified: Secondary | ICD-10-CM

## 2021-05-06 DIAGNOSIS — R894 Abnormal immunological findings in specimens from other organs, systems and tissues: Secondary | ICD-10-CM

## 2021-05-06 DIAGNOSIS — R768 Other specified abnormal immunological findings in serum: Secondary | ICD-10-CM

## 2021-05-06 NOTE — Telephone Encounter (Signed)
I discussed the lab results with Brooke Jimenez and obtained additional information. ? ?The SSA was positive.  Combined with her polyneuropathy, this is concerning for Sjogren's syndrome.  Additionally, she reports dry eyes and to a lesser extent dry mouth ? ?I will refer to rheumatology for evaluation for possible Sjogren's syndrome or other rheumatologic issue. ?

## 2021-05-07 ENCOUNTER — Ambulatory Visit: Payer: Medicare Other | Admitting: Physical Therapy

## 2021-05-07 ENCOUNTER — Telehealth: Payer: Self-pay | Admitting: Neurology

## 2021-05-07 ENCOUNTER — Other Ambulatory Visit: Payer: Self-pay

## 2021-05-07 ENCOUNTER — Encounter: Payer: Self-pay | Admitting: Physical Therapy

## 2021-05-07 DIAGNOSIS — M6281 Muscle weakness (generalized): Secondary | ICD-10-CM

## 2021-05-07 DIAGNOSIS — R262 Difficulty in walking, not elsewhere classified: Secondary | ICD-10-CM

## 2021-05-07 DIAGNOSIS — M545 Low back pain, unspecified: Secondary | ICD-10-CM

## 2021-05-07 DIAGNOSIS — M25552 Pain in left hip: Secondary | ICD-10-CM

## 2021-05-07 NOTE — Telephone Encounter (Signed)
Sent to [facility] Rheumatology ph # [phone] ?

## 2021-05-07 NOTE — Therapy (Signed)
[place] ?[address] ?[place], [place], [address] ?Phone: [phone]   Fax:  [phone] ? ?Physical Therapy Treatment ? ?Patient Details  ?Name: Brooke Jimenez ?MRN: [id number] ?Date of Birth: 1945/03/26 ?Referring Provider (PT): Rolena Infante ? ? ?Encounter Date: 05/07/2021 ? ? PT End of Session - 05/07/21 1512   ? ? Visit Number 13   ? Date for PT Re-Evaluation 05/17/21   ? Authorization Type Medicare   ? PT Start Time 1400   ? PT Stop Time 2330   ? PT Time Calculation (min) 49 min   ? Activity Tolerance Patient tolerated treatment well   ? Behavior During Therapy [facility] for tasks assessed/performed   ? ?  ?  ? ?  ? ? ?Past Medical History:  ?Diagnosis Date  ? Acne rosacea   ? Anxiety   ? Arthritis   ? arthritis in hip   ? Bronchiectasis ([place])   ? Cataract   ? beginning  ? Complication of anesthesia   ? "really sore throat"  ? Corneal dystrophy   ? DDD (degenerative disc disease), cervical   ? and lower back  ? Diverticulosis   ? Dysphagia   ? chronic- please see ultrasound done 4/16 15 in EPIC   ? Dysrhythmia   ? sometimes patient has extra beats per patient   ? Esophageal dysmotility   ? Family history of adverse reaction to anesthesia   ? mother slow to wake up   ? GERD (gastroesophageal reflux disease)   ? under control  ? Glaucoma   ? "suspect"  ? Gout   ? one finger x1 attack  ? H/O hiatal hernia   ? Hearing loss   ? bilateral due to nerve loss. wears hearing aides  ? Heart murmur   ? no problem   ? History of chicken pox   ? History of melanoma   ? melanoma- 1986 and 1997   ? Hypothyroidism   ? Low back pain with sciatica   ? Measles   ? hx of  ? Mitral valve prolapse   ? Nocturia   ? Osteoporosis   ? Peripheral neuropathy   ? feet  ? PONV (postoperative nausea and vomiting)   ? PTSD (post-traumatic stress disorder) 2006  ? RSD (reflex sympathetic dystrophy) 1993  ? Spider veins   ? Urinary incontinence   ? occasional  ? Varicose veins   ? ? ?Past Surgical  History:  ?Procedure Laterality Date  ? ESOPHAGEAL MANOMETRY N/A 12/17/2015  ? Procedure: ESOPHAGEAL MANOMETRY (EM);  Surgeon: Mauri Pole, MD;  [facility];  Service: Endoscopy;  Laterality: N/A;  ? HARDWARE REMOVAL Left 10/17/2013  ? Procedure: HARDWARE REMOVAL LEFT HIP;  Surgeon: Gearlean Alf, MD;  [facility];  Service: Orthopedics;  Laterality: Left;  ? HIP FRACTURE SURGERY Left 2006  ? 3 screws  ? INSERTION OF MESH  02/08/2014  ? Procedure: INSERTION OF MESH;  Surgeon: Jackolyn Confer, MD;  [facility];  Service: General;;  ? melanoma surgery   1986, 1997  ? PARATHYROIDECTOMY  04/02/15  ? [place] IMPEDANCE STUDY N/A 12/17/2015  ? Procedure: [place] IMPEDANCE STUDY;  Surgeon: Mauri Pole, MD;  [facility];  Service: Endoscopy;  Laterality: N/A;  ? TOTAL HIP ARTHROPLASTY Left 04/10/2014  ? Procedure: LEFT TOTAL HIP ARTHROPLASTY ANTERIOR APPROACH;  Surgeon: Gearlean Alf, MD;  [facility];  Service: Orthopedics;  Laterality: Left;  ? UMBILICAL HERNIA REPAIR N/A  02/08/2014  ? Procedure: HERNIA REPAIR UMBILICAL ADULT WITH MESH;  Surgeon: Jackolyn Confer, MD;  [facility];  Service: General;  Laterality: N/A;  ? ? ?There were no vitals filed for this visit. ? ? Subjective Assessment - 05/07/21 1410   ? ? Subjective Patient reports that she had a fall last Thursday, she reports she was talking and was stepping up a curb but it was higher than normal and she caughter her foot and fell onto the left knee area, she has a lot of bruising in the left knee, shin and ankle. there is a lot of swelling, she reports that she could not put a sock on, she denies pain in the back or hip, has some left knee pain, left ankle pain and tightness and some pain in the right lumbar area   ? Currently in Pain? Yes   ? Pain Location Knee   ? Pain Orientation Left   ? Pain Descriptors / Indicators Sore   ? Aggravating Factors  falling has hurt the knee and the ankle   ? ?  ?  ? ?   ? ? ? ? ? ? ? ? ? ? ? ? ? ? ? ? ? ? ? ? [place] Adult PT Treatment/Exercise - 05/07/21 0001   ? ?  ? Lumbar Exercises: Aerobic  ? UBE (Upper Arm Bike) level 1 x 4 minutes   ? Nustep level 4 x 8 minutes   ?  ? Lumbar Exercises: Machines for Strengthening  ? Other Lumbar Machine Exercise seated row 15# 2x10, lats 20# 2x10   ?  ? Lumbar Exercises: Supine  ? Clam 20 reps   ? Bent Knee Raise 15 reps   ? Other Supine Lumbar Exercises feet on ball K2C, trunk rotation, small bridges, isometrica abs   ?  ? Knee/Hip Exercises: Seated  ? Ball Squeeze 20 reps   ? ?  ?  ? ?  ? ? ? ? ? ? ? ? ? ? ? ? PT Short Term Goals - 03/21/21 1005   ? ?  ? PT SHORT TERM GOAL #1  ? Title independent with HEP   ? Status Achieved   ? ?  ?  ? ?  ? ? ? ? PT Long Term Goals - 05/07/21 1516   ? ?  ? PT LONG TERM GOAL #1  ? Title go up and down stairs step over step consistently   ? Status On-going   ?  ? PT LONG TERM GOAL #2  ? Title return to independent gym program   ? Status Partially Met   ? ?  ?  ? ?  ? ? ? ? ? ? ? ? Plan - 05/07/21 1512   ? ? Clinical Impression Statement Patient had a fall last week, she fell on the left knee, denies back, hip or pelvis pain.  She has significant bruising in the left knee, shin and the ankle, she also has some significant swelling in that area.  We took it easy on the legs today, she had tightness in the ankle and knee with some activities due to swelling but no pain.  Seemed to tolerate it well   ? PT Next Visit Plan continue to work on her core and function see if the knee and ankle feel better   ? Consulted and Agree with Plan of Care Patient   ? ?  ?  ? ?  ? ? ?Patient will benefit from skilled  therapeutic intervention in order to improve the following deficits and impairments:  Difficulty walking, Abnormal gait, Cardiopulmonary status limiting activity, Decreased endurance, Pain, Decreased activity tolerance, Decreased balance, Impaired flexibility, Decreased strength, Decreased mobility ? ?Visit  Diagnosis: ?Muscle weakness (generalized) ? ?Chronic bilateral low back pain without sciatica ? ?Pain in left hip ? ?Difficulty in walking, not elsewhere classified ? ? ? ? ?Problem List ?Patient Active Problem List  ? Diagnosis Date Noted  ? Upper respiratory infection 01/15/2018  ? Postherpetic neuralgia 12/25/2016  ? Dysesthesia 12/25/2016  ? Polyneuropathy 12/25/2016  ? Exposure to influenza 04/01/2016  ? Dysphagia   ? Bronchiectasis without acute exacerbation ([place]) 08/30/2015  ? Bronchiectasis ([place]) 08/14/2015  ? Chronic rhinitis 08/14/2015  ? Avascular necrosis of femur head, left ([place]) 04/10/2014  ? OA (osteoarthritis) of hip 04/10/2014  ? Painful orthopaedic hardware [facility]) 10/17/2013  ? ? Sumner Boast, PT ?05/07/2021, 3:17 PM ? ?[place] ?[place] [address] ?[place], [place], [address] ?Phone: [phone]   Fax:  [phone] ? ?Name: Brooke Jimenez ?MRN: [id number] ?Date of Birth: 01-23-1946 ? ? ? ?

## 2021-05-09 ENCOUNTER — Ambulatory Visit: Payer: Medicare Other | Admitting: Physical Therapy

## 2021-05-09 ENCOUNTER — Other Ambulatory Visit: Payer: Self-pay

## 2021-05-09 ENCOUNTER — Encounter: Payer: Self-pay | Admitting: Physical Therapy

## 2021-05-09 DIAGNOSIS — M6281 Muscle weakness (generalized): Secondary | ICD-10-CM | POA: Diagnosis not present

## 2021-05-09 NOTE — Therapy (Signed)
[place] ?[place] ?[address] ?[place], [place], [address] ?Phone: [phone]   Fax:  [phone] ? ?Physical Therapy Treatment ? ?Patient Details  ?Name: Brooke Jimenez ?MRN: [id number] ?Date of Birth: [birth year] ?Referring Provider (PT): Rolena Infante ? ? ?Encounter Date: 05/09/2021 ? ? PT End of Session - 05/09/21 1610   ? ? Visit Number 14   ? Date for PT Re-Evaluation 05/17/21   ? Authorization Type Medicare   ? PT Start Time 7262   ? PT Stop Time 1530   ? PT Time Calculation (min) 46 min   ? Activity Tolerance Patient limited by pain   ? Behavior During Therapy [facility] for tasks assessed/performed   ? ?  ?  ? ?  ? ? ?Past Medical History:  ?Diagnosis Date  ? Acne rosacea   ? Anxiety   ? Arthritis   ? arthritis in hip   ? Bronchiectasis ([place])   ? Cataract   ? beginning  ? Complication of anesthesia   ? "really sore throat"  ? Corneal dystrophy   ? DDD (degenerative disc disease), cervical   ? and lower back  ? Diverticulosis   ? Dysphagia   ? chronic- please see ultrasound done 4/16 15 in EPIC   ? Dysrhythmia   ? sometimes patient has extra beats per patient   ? Esophageal dysmotility   ? Family history of adverse reaction to anesthesia   ? mother slow to wake up   ? GERD (gastroesophageal reflux disease)   ? under control  ? Glaucoma   ? "suspect"  ? Gout   ? one finger x1 attack  ? H/O hiatal hernia   ? Hearing loss   ? bilateral due to nerve loss. wears hearing aides  ? Heart murmur   ? no problem   ? History of chicken pox   ? History of melanoma   ? melanoma- 1986 and 1997   ? Hypothyroidism   ? Low back pain with sciatica   ? Measles   ? hx of  ? Mitral valve prolapse   ? Nocturia   ? Osteoporosis   ? Peripheral neuropathy   ? feet  ? PONV (postoperative nausea and vomiting)   ? PTSD (post-traumatic stress disorder) 2006  ? RSD (reflex sympathetic dystrophy) 1993  ? Spider veins   ? Urinary incontinence   ? occasional  ? Varicose veins   ? ? ?Past Surgical History:   ?Procedure Laterality Date  ? ESOPHAGEAL MANOMETRY N/A 12/17/2015  ? Procedure: ESOPHAGEAL MANOMETRY (EM);  Surgeon: Mauri Pole, MD;  [facility];  Service: Endoscopy;  Laterality: N/A;  ? HARDWARE REMOVAL Left 10/17/2013  ? Procedure: HARDWARE REMOVAL LEFT HIP;  Surgeon: Gearlean Alf, MD;  [facility];  Service: Orthopedics;  Laterality: Left;  ? HIP FRACTURE SURGERY Left 2006  ? 3 screws  ? INSERTION OF MESH  02/08/2014  ? Procedure: INSERTION OF MESH;  Surgeon: Jackolyn Confer, MD;  [facility];  Service: General;;  ? melanoma surgery   1986, 1997  ? PARATHYROIDECTOMY  04/02/15  ? [place] IMPEDANCE STUDY N/A 12/17/2015  ? Procedure: [place] IMPEDANCE STUDY;  Surgeon: Mauri Pole, MD;  [facility];  Service: Endoscopy;  Laterality: N/A;  ? TOTAL HIP ARTHROPLASTY Left 04/10/2014  ? Procedure: LEFT TOTAL HIP ARTHROPLASTY ANTERIOR APPROACH;  Surgeon: Gearlean Alf, MD;  [facility];  Service: Orthopedics;  Laterality: Left;  ? UMBILICAL HERNIA REPAIR N/A  02/08/2014  ? Procedure: HERNIA REPAIR UMBILICAL ADULT WITH MESH;  Surgeon: Jackolyn Confer, MD;  [facility];  Service: General;  Laterality: N/A;  ? ? ?There were no vitals filed for this visit. ? ? Subjective Assessment - 05/09/21 1455   ? ? Subjective Patient is limping much more today, she is sore and c/o some increes of pain in the left knee, shin andkle and foot.  She reports that she woke up with more pain, she has bruising in the left knee down to the toes, a little more swollen today   ? Currently in Pain? Yes   ? Pain Score 6    ? Pain Location Knee   ? Pain Orientation Left   ? Pain Descriptors / Indicators Sore;Tender   ? Aggravating Factors  woke up with more pain today   ? ?  ?  ? ?  ? ? ? ? ? ? ? ? ? ? ? ? ? ? ? ? ? ? ? ? [place] Adult PT Treatment/Exercise - 05/09/21 0001   ? ?  ? Lumbar Exercises: Seated  ? Other Seated Lumbar Exercises red tband row and extension   ?  ? Lumbar Exercises: Supine  ? Clam  20 reps   ? Bent Knee Raise 15 reps   ? Straight Leg Raise 15 reps   ? Other Supine Lumbar Exercises feet on ball K2C, trunk rotation, small bridges, isometrica abs   ?  ? Knee/Hip Exercises: Supine  ? Short [employer] Both;2 sets;10 reps   ? Other Supine Knee/Hip Exercises ankle pumps and circles in elevation   ?  ? Modalities  ? Modalities Vasopneumatic   ?  ? Vasopneumatic  ? Number Minutes Vasopneumatic  10 minutes   ? Vasopnuematic Location  Ankle   ? Vasopneumatic Pressure Medium   ? Vasopneumatic Temperature  35   ? ?  ?  ? ?  ? ? ? ? ? ? ? ? ? ? ? ? PT Short Term Goals - 03/21/21 1005   ? ?  ? PT SHORT TERM GOAL #1  ? Title independent with HEP   ? Status Achieved   ? ?  ?  ? ?  ? ? ? ? PT Long Term Goals - 05/09/21 1612   ? ?  ? PT LONG TERM GOAL #1  ? Title go up and down stairs step over step consistently   ? Status On-going   ? ?  ?  ? ?  ? ? ? ? ? ? ? ? Plan - 05/09/21 1610   ? ? Clinical Impression Statement Patient seems to be in more pain with swelling in the left ankle and foot, has some significant ecchymosis in the plantar arch area.  She is much more swollen and is very tender, I tried to do exercises that were NWB.  She did confirm that x-rays were negative.  I added vaso to help with swelling   ? PT Next Visit Plan see if we can get the pain and swelling better   ? Consulted and Agree with Plan of Care Patient   ? ?  ?  ? ?  ? ? ?Patient will benefit from skilled therapeutic intervention in order to improve the following deficits and impairments:  Difficulty walking, Abnormal gait, Cardiopulmonary status limiting activity, Decreased endurance, Pain, Decreased activity tolerance, Decreased balance, Impaired flexibility, Decreased strength, Decreased mobility ? ?Visit Diagnosis: ?Muscle weakness (generalized) ? ?Pain in left hip ? ?Difficulty  in walking, not elsewhere classified ? ? ? ? ?Problem List ?Patient Active Problem List  ? Diagnosis Date Noted  ? Upper respiratory infection 01/15/2018   ? Postherpetic neuralgia 12/25/2016  ? Dysesthesia 12/25/2016  ? Polyneuropathy 12/25/2016  ? Exposure to influenza 04/01/2016  ? Dysphagia   ? Bronchiectasis without acute exacerbation ([place]) 08/30/2015  ? Bronchiectasis ([place]) 08/14/2015  ? Chronic rhinitis 08/14/2015  ? Avascular necrosis of femur head, left ([place]) 04/10/2014  ? OA (osteoarthritis) of hip 04/10/2014  ? Painful orthopaedic hardware [facility]) 10/17/2013  ? ? Sumner Boast, PT ?05/09/2021, 4:12 PM ? ?[place] ?[place] [address] ?[place], [place], [address] ?Phone: [phone]   Fax:  [phone] ? ?Name: Brooke Jimenez ?MRN: [id number] ?Date of Birth: Jul 24, 1945 ? ? ? ?

## 2021-05-14 ENCOUNTER — Encounter: Payer: Self-pay | Admitting: Physical Therapy

## 2021-05-14 ENCOUNTER — Ambulatory Visit: Payer: Medicare Other | Admitting: Physical Therapy

## 2021-05-14 ENCOUNTER — Other Ambulatory Visit: Payer: Self-pay

## 2021-05-14 DIAGNOSIS — R600 Localized edema: Secondary | ICD-10-CM

## 2021-05-14 DIAGNOSIS — M25552 Pain in left hip: Secondary | ICD-10-CM

## 2021-05-14 DIAGNOSIS — M6281 Muscle weakness (generalized): Secondary | ICD-10-CM

## 2021-05-14 DIAGNOSIS — R262 Difficulty in walking, not elsewhere classified: Secondary | ICD-10-CM

## 2021-05-14 DIAGNOSIS — M25572 Pain in left ankle and joints of left foot: Secondary | ICD-10-CM

## 2021-05-14 NOTE — Therapy (Signed)
[place] ?[place] [address] ?[place], [place], [address] ?Phone: [phone]   Fax:  [phone] ? ?Physical Therapy Treatment ? ?Patient Details  ?Name: Brooke Jimenez ?MRN: [id number] ?Date of Birth: 23-Nov-1945 ?Referring Provider (PT): Rolena Infante ? ? ?Encounter Date: 05/14/2021 ? ? PT End of Session - 05/14/21 1146   ? ? Visit Number 15   ? Date for PT Re-Evaluation 05/17/21   ? Authorization Type Medicare   ? PT Start Time 1055   ? PT Stop Time 1155   ? PT Time Calculation (min) 60 min   ? Activity Tolerance Patient limited by pain   ? Behavior During Therapy [facility] for tasks assessed/performed   ? ?  ?  ? ?  ? ? ?Past Medical History:  ?Diagnosis Date  ? Acne rosacea   ? Anxiety   ? Arthritis   ? arthritis in hip   ? Bronchiectasis ([place])   ? Cataract   ? beginning  ? Complication of anesthesia   ? "really sore throat"  ? Corneal dystrophy   ? DDD (degenerative disc disease), cervical   ? and lower back  ? Diverticulosis   ? Dysphagia   ? chronic- please see ultrasound done 4/16 15 in EPIC   ? Dysrhythmia   ? sometimes patient has extra beats per patient   ? Esophageal dysmotility   ? Family history of adverse reaction to anesthesia   ? mother slow to wake up   ? GERD (gastroesophageal reflux disease)   ? under control  ? Glaucoma   ? "suspect"  ? Gout   ? one finger x1 attack  ? H/O hiatal hernia   ? Hearing loss   ? bilateral due to nerve loss. wears hearing aides  ? Heart murmur   ? no problem   ? History of chicken pox   ? History of melanoma   ? melanoma- 1986 and 1997   ? Hypothyroidism   ? Low back pain with sciatica   ? Measles   ? hx of  ? Mitral valve prolapse   ? Nocturia   ? Osteoporosis   ? Peripheral neuropathy   ? feet  ? PONV (postoperative nausea and vomiting)   ? PTSD (post-traumatic stress disorder) 2006  ? RSD (reflex sympathetic dystrophy) 1993  ? Spider veins   ? Urinary incontinence   ? occasional  ? Varicose veins   ? ? ?Past Surgical History:   ?Procedure Laterality Date  ? ESOPHAGEAL MANOMETRY N/A 12/17/2015  ? Procedure: ESOPHAGEAL MANOMETRY (EM);  Surgeon: Mauri Pole, MD;  [facility];  Service: Endoscopy;  Laterality: N/A;  ? HARDWARE REMOVAL Left 10/17/2013  ? Procedure: HARDWARE REMOVAL LEFT HIP;  Surgeon: Gearlean Alf, MD;  [facility];  Service: Orthopedics;  Laterality: Left;  ? HIP FRACTURE SURGERY Left 2006  ? 3 screws  ? INSERTION OF MESH  02/08/2014  ? Procedure: INSERTION OF MESH;  Surgeon: Jackolyn Confer, MD;  [facility];  Service: General;;  ? melanoma surgery   1986, 1997  ? PARATHYROIDECTOMY  04/02/15  ? [place] IMPEDANCE STUDY N/A 12/17/2015  ? Procedure: [place] IMPEDANCE STUDY;  Surgeon: Mauri Pole, MD;  [facility];  Service: Endoscopy;  Laterality: N/A;  ? TOTAL HIP ARTHROPLASTY Left 04/10/2014  ? Procedure: LEFT TOTAL HIP ARTHROPLASTY ANTERIOR APPROACH;  Surgeon: Gearlean Alf, MD;  [facility];  Service: Orthopedics;  Laterality: Left;  ? UMBILICAL HERNIA REPAIR N/A  02/08/2014  ? Procedure: HERNIA REPAIR UMBILICAL ADULT WITH MESH;  Surgeon: Jackolyn Confer, MD;  [facility];  Service: General;  Laterality: N/A;  ? ? ?There were no vitals filed for this visit. ? ? Subjective Assessment - 05/14/21 1128   ? ? Subjective Patient continues to limp significantly, reports that she feels she is moving better   ? Currently in Pain? Yes   ? Pain Score 6    ? Pain Location Ankle   ? Pain Orientation Left   ? Pain Descriptors / Indicators Sore;Tender   ? ?  ?  ? ?  ? ? ? ? ? ? ? ? ? ? ? ? ? ? ? ? ? ? ? ? [place] Adult PT Treatment/Exercise - 05/14/21 0001   ? ?  ? Knee/Hip Exercises: Seated  ? Other Seated Knee/Hip Exercises sit fit all ankle motions   ?  ? Knee/Hip Exercises: Supine  ? Other Supine Knee/Hip Exercises ankle pumps and circles in elevation   ?  ? Vasopneumatic  ? Number Minutes Vasopneumatic  24 minutes   ? Vasopnuematic Location  Ankle;Knee   ? Vasopneumatic Pressure Medium   ?  Vasopneumatic Temperature  35   ?  ? Manual Therapy  ? Passive ROM ankle, toes   ? ?  ?  ? ?  ? ? ? ? ? ? ? ? ? ? ? ? PT Short Term Goals - 03/21/21 1005   ? ?  ? PT SHORT TERM GOAL #1  ? Title independent with HEP   ? Status Achieved   ? ?  ?  ? ?  ? ? ? ? PT Long Term Goals - 05/09/21 1612   ? ?  ? PT LONG TERM GOAL #1  ? Title go up and down stairs step over step consistently   ? Status On-going   ? ?  ?  ? ?  ? ? ? ? ? ? ? ? Plan - 05/14/21 1148   ? ? Clinical Impression Statement Patient still really limping and has significant swelling in the left ankle and foot, she has very tender area from the knee to the top of the foot.  Again she reports x-rays were negative,.  I advised her to try to contact orthopod just to assure that it is okay as she is 12 days out from the fall. Hopefully it is soft tissue injury but she is really struggling to walk, I focused on some easy ROM exercises and then PROM, she is very tender to hand touch but tolerated the motions okay   ? PT Next Visit Plan see if we can get the pain and swelling better, advised her to call MD   ? Consulted and Agree with Plan of Care Patient   ? ?  ?  ? ?  ? ? ?Patient will benefit from skilled therapeutic intervention in order to improve the following deficits and impairments:  Difficulty walking, Abnormal gait, Cardiopulmonary status limiting activity, Decreased endurance, Pain, Decreased activity tolerance, Decreased balance, Impaired flexibility, Decreased strength, Decreased mobility ? ?Visit Diagnosis: ?Muscle weakness (generalized) ? ?Pain in left hip ? ?Difficulty in walking, not elsewhere classified ? ? ? ? ?Problem List ?Patient Active Problem List  ? Diagnosis Date Noted  ? Upper respiratory infection 01/15/2018  ? Postherpetic neuralgia 12/25/2016  ? Dysesthesia 12/25/2016  ? Polyneuropathy 12/25/2016  ? Exposure to influenza 04/01/2016  ? Dysphagia   ? Bronchiectasis without acute exacerbation ([place]) 08/30/2015  ?  Bronchiectasis ([place])  08/14/2015  ? Chronic rhinitis 08/14/2015  ? Avascular necrosis of femur head, left ([place]) 04/10/2014  ? OA (osteoarthritis) of hip 04/10/2014  ? Painful orthopaedic hardware [facility]) 10/17/2013  ? ? Sumner Boast, PT ?05/14/2021, 11:52 AM ? ?[place] ?[place] [address] ?[place], [place], [address] ?Phone: [phone]   Fax:  [phone] ? ?Name: Fraya Ueda ?MRN: [id number] ?Date of Birth: March 15, 1945 ? ? ? ?

## 2021-05-15 ENCOUNTER — Other Ambulatory Visit (HOSPITAL_BASED_OUTPATIENT_CLINIC_OR_DEPARTMENT_OTHER): Payer: Self-pay

## 2021-05-15 ENCOUNTER — Encounter (INDEPENDENT_AMBULATORY_CARE_PROVIDER_SITE_OTHER): Payer: Medicare Other

## 2021-05-15 ENCOUNTER — Other Ambulatory Visit (HOSPITAL_BASED_OUTPATIENT_CLINIC_OR_DEPARTMENT_OTHER): Payer: Self-pay | Admitting: Family Medicine

## 2021-05-15 DIAGNOSIS — M7989 Other specified soft tissue disorders: Secondary | ICD-10-CM | POA: Diagnosis not present

## 2021-05-15 DIAGNOSIS — M79662 Pain in left lower leg: Secondary | ICD-10-CM

## 2021-05-15 DIAGNOSIS — M79661 Pain in right lower leg: Secondary | ICD-10-CM

## 2021-05-21 ENCOUNTER — Other Ambulatory Visit: Payer: Self-pay

## 2021-05-21 ENCOUNTER — Encounter: Payer: Self-pay | Admitting: Physical Therapy

## 2021-05-21 ENCOUNTER — Ambulatory Visit: Payer: Medicare Other | Admitting: Physical Therapy

## 2021-05-21 DIAGNOSIS — R600 Localized edema: Secondary | ICD-10-CM

## 2021-05-21 DIAGNOSIS — M6281 Muscle weakness (generalized): Secondary | ICD-10-CM

## 2021-05-21 DIAGNOSIS — M25552 Pain in left hip: Secondary | ICD-10-CM

## 2021-05-21 DIAGNOSIS — R262 Difficulty in walking, not elsewhere classified: Secondary | ICD-10-CM

## 2021-05-21 NOTE — Therapy (Signed)
[place] ?[address] [place] [address] ?[place], [place], [address] ?Phone: [phone]   Fax:  [phone] ? ?Physical Therapy Treatment ? ?Patient Details  ?Name: Brooke Jimenez ?MRN: [id number] ?Date of Birth: 03/05/1945 ?Referring Provider (PT): Rolena Infante ? ? ?Encounter Date: 05/21/2021 ? ? PT End of Session - 05/21/21 1353   ? ? Visit Number 16   ? Date for PT Re-Evaluation 06/21/21   ? PT Start Time 1314   ? PT Stop Time 5732   ? PT Time Calculation (min) 61 min   ? Activity Tolerance Patient limited by pain   ? Behavior During Therapy [facility] for tasks assessed/performed   ? ?  ?  ? ?  ? ? ?Past Medical History:  ?Diagnosis Date  ? Acne rosacea   ? Anxiety   ? Arthritis   ? arthritis in hip   ? Bronchiectasis ([place])   ? Cataract   ? beginning  ? Complication of anesthesia   ? "really sore throat"  ? Corneal dystrophy   ? DDD (degenerative disc disease), cervical   ? and lower back  ? Diverticulosis   ? Dysphagia   ? chronic- please see ultrasound done 4/16 15 in EPIC   ? Dysrhythmia   ? sometimes patient has extra beats per patient   ? Esophageal dysmotility   ? Family history of adverse reaction to anesthesia   ? mother slow to wake up   ? GERD (gastroesophageal reflux disease)   ? under control  ? Glaucoma   ? "suspect"  ? Gout   ? one finger x1 attack  ? H/O hiatal hernia   ? Hearing loss   ? bilateral due to nerve loss. wears hearing aides  ? Heart murmur   ? no problem   ? History of chicken pox   ? History of melanoma   ? melanoma- 1986 and 1997   ? Hypothyroidism   ? Low back pain with sciatica   ? Measles   ? hx of  ? Mitral valve prolapse   ? Nocturia   ? Osteoporosis   ? Peripheral neuropathy   ? feet  ? PONV (postoperative nausea and vomiting)   ? PTSD (post-traumatic stress disorder) 2006  ? RSD (reflex sympathetic dystrophy) 1993  ? Spider veins   ? Urinary incontinence   ? occasional  ? Varicose veins   ? ? ?Past Surgical History:  ?Procedure Laterality Date  ?  ESOPHAGEAL MANOMETRY N/A 12/17/2015  ? Procedure: ESOPHAGEAL MANOMETRY (EM);  Surgeon: Mauri Pole, MD;  [facility];  Service: Endoscopy;  Laterality: N/A;  ? HARDWARE REMOVAL Left 10/17/2013  ? Procedure: HARDWARE REMOVAL LEFT HIP;  Surgeon: Gearlean Alf, MD;  [facility];  Service: Orthopedics;  Laterality: Left;  ? HIP FRACTURE SURGERY Left 2006  ? 3 screws  ? INSERTION OF MESH  02/08/2014  ? Procedure: INSERTION OF MESH;  Surgeon: Jackolyn Confer, MD;  [facility];  Service: General;;  ? melanoma surgery   1986, 1997  ? PARATHYROIDECTOMY  04/02/15  ? [place] IMPEDANCE STUDY N/A 12/17/2015  ? Procedure: [place] IMPEDANCE STUDY;  Surgeon: Mauri Pole, MD;  [facility];  Service: Endoscopy;  Laterality: N/A;  ? TOTAL HIP ARTHROPLASTY Left 04/10/2014  ? Procedure: LEFT TOTAL HIP ARTHROPLASTY ANTERIOR APPROACH;  Surgeon: Gearlean Alf, MD;  [facility];  Service: Orthopedics;  Laterality: Left;  ? UMBILICAL HERNIA REPAIR N/A 02/08/2014  ? Procedure: HERNIA REPAIR  UMBILICAL ADULT WITH MESH;  Surgeon: Jackolyn Confer, MD;  [facility];  Service: General;  Laterality: N/A;  ? ? ?There were no vitals filed for this visit. ? ? Subjective Assessment - 05/21/21 1317   ? ? Subjective Patient saw MD last week, they tried to drain the hematoma and were somewhat sucessful, she is reporting that she is walking better and feeling a little better   ? Currently in Pain? Yes   ? Pain Score 5    ? Pain Location Ankle   ? Pain Orientation Left   ? Aggravating Factors  the fall has really set me back   ? ?  ?  ? ?  ? ? ? ? ? ? ? ? ? ? ? ? ? ? ? ? ? ? ? ? [place] Adult PT Treatment/Exercise - 05/21/21 0001   ? ?  ? Lumbar Exercises: Standing  ? Row Strengthening;Both;10 reps   ? Theraband Level (Row) Level 2 (Red)   ? Shoulder Extension Strengthening;Both;10 reps   ? Theraband Level (Shoulder Extension) Level 2 (Red)   ?  ? Lumbar Exercises: Seated  ? Other Seated Lumbar Exercises red tband  row and extension 2x15   ?  ? Lumbar Exercises: Supine  ? Clam 20 reps   ? Other Supine Lumbar Exercises feet on ball K2C, trunk rotation, small bridges, isometrica abs   ?  ? Knee/Hip Exercises: Seated  ? Long [employer] Strengthening;Both;10 reps;Weights   ? Long Arc Quad Weight 3 lbs.   ? Ball Squeeze 20 reps   ? Marching Left;2 sets;10 reps   ?  ? Knee/Hip Exercises: Supine  ? Short Arc [employer] Both;2 sets;10 reps   ? Other Supine Knee/Hip Exercises ankle pumps and circles in elevation   ?  ? Vasopneumatic  ? Number Minutes Vasopneumatic  10 minutes   ? Vasopnuematic Location  Knee   ? Vasopneumatic Pressure Medium   ? Vasopneumatic Temperature  35   ?  ? Manual Therapy  ? Manual therapy comments light touch STM to the left shin and ankel area   ? Passive ROM ankle, toes   ? ?  ?  ? ?  ? ? ? ? ? ? ? ? ? ? ? ? PT Short Term Goals - 03/21/21 1005   ? ?  ? PT SHORT TERM GOAL #1  ? Title independent with HEP   ? Status Achieved   ? ?  ?  ? ?  ? ? ? ? PT Long Term Goals - 05/21/21 1456   ? ?  ? PT LONG TERM GOAL #1  ? Title go up and down stairs step over step consistently   ? Status On-going   ?  ? PT LONG TERM GOAL #2  ? Title return to independent gym program   ? Status Partially Met   ?  ? PT LONG TERM GOAL #3  ? Title walk without device with minimal deviation   ? Status Partially Met   ?  ? PT LONG TERM GOAL #4  ? Title report pain decreased 50%   ? Status Partially Met   ?  ? PT LONG TERM GOAL #5  ? Title decrease TUG time to 13 seconds   ? Status On-going   ? ?  ?  ? ?  ? ? ? ? ? ? ? ? Plan - 05/21/21 1452   ? ? Clinical Impression Statement Patient saw Md last week and had  the hematoma drained.  Negative for DVT, felt like this would help, she is walking much better today, still signifiacant pain and swelling in the shin area, we returned to some exercises and movements.  Still limping, this fall really caused her problems with pain, swelling and diffiuclty walking   ? PT Frequency 2x / week   ? PT Duration  4 weeks   ? PT Treatment/Interventions ADLs/Self Care Home Management;Cryotherapy;Electrical Stimulation;Iontophoresis 32m/ml Dexamethasone;Moist Heat;Gait training;Stair training;Functional mobility training;Therapeutic activities;Therapeutic exercise;Balance training;Neuromuscular re-education;Manual techniques;Patient/family education   ? PT Next Visit Plan see if we can get the pain and swelling better, return to walking and exercises   ? Consulted and Agree with Plan of Care Patient   ? ?  ?  ? ?  ? ? ?Patient will benefit from skilled therapeutic intervention in order to improve the following deficits and impairments:  Difficulty walking, Abnormal gait, Cardiopulmonary status limiting activity, Decreased endurance, Pain, Decreased activity tolerance, Decreased balance, Impaired flexibility, Decreased strength, Decreased mobility ? ?Visit Diagnosis: ?Muscle weakness (generalized) - Plan: PT plan of care cert/re-cert ? ?Pain in left hip - Plan: PT plan of care cert/re-cert ? ?Difficulty in walking, not elsewhere classified - Plan: PT plan of care cert/re-cert ? ?Localized edema - Plan: PT plan of care cert/re-cert ? ? ? ? ?Problem List ?Patient Active Problem List  ? Diagnosis Date Noted  ? Upper respiratory infection 01/15/2018  ? Postherpetic neuralgia 12/25/2016  ? Dysesthesia 12/25/2016  ? Polyneuropathy 12/25/2016  ? Exposure to influenza 04/01/2016  ? Dysphagia   ? Bronchiect[place] acute exacerbation ([place] 08/30/2015  ? Bronchiectasis ([place] 08/14/2015  ? Chronic rhinitis 08/14/2015  ? Avascular necrosis of femur head, left ([place] 04/10/2014  ? OA (osteoarthritis) of hip 04/10/2014  ? Painful orthopaedic hardware ([facility] 10/17/2013  ? ? ?Sumner Boast PT ?05/21/2021, 3:05 PM ? ?[place] ?[address] ?[place] [address]?Phone: [phone]  Fax:  [phone]? ?Name: CShaila Jimenez?MRN: [id number]?Date of Birth: [birth year]? ? ? ?

## 2021-05-23 ENCOUNTER — Ambulatory Visit: Payer: Medicare Other | Admitting: Physical Therapy

## 2021-05-23 ENCOUNTER — Encounter: Payer: Self-pay | Admitting: Physical Therapy

## 2021-05-23 DIAGNOSIS — R262 Difficulty in walking, not elsewhere classified: Secondary | ICD-10-CM

## 2021-05-23 DIAGNOSIS — M6281 Muscle weakness (generalized): Secondary | ICD-10-CM | POA: Diagnosis not present

## 2021-05-23 DIAGNOSIS — M25572 Pain in left ankle and joints of left foot: Secondary | ICD-10-CM

## 2021-05-23 DIAGNOSIS — R600 Localized edema: Secondary | ICD-10-CM

## 2021-05-23 DIAGNOSIS — M25552 Pain in left hip: Secondary | ICD-10-CM

## 2021-05-23 DIAGNOSIS — M545 Low back pain, unspecified: Secondary | ICD-10-CM

## 2021-05-23 NOTE — Therapy (Signed)
[place] ?[place] ?[place], [place], [address] ?Phone: [phone]   Fax:  [phone] ? ?Physical Therapy Treatment ? ?Patient Details  ?Name: Brooke Jimenez ?MRN: [id number] ?Date of Birth: [birth year] ?Referring Provider (PT): Rolena Infante ? ? ?Encounter Date: 05/23/2021 ? ? PT End of Session - 05/23/21 1138   ? ? Visit Number 17   ? Date for PT Re-Evaluation 06/21/21   ? Authorization Type Medicare   ? PT Start Time 1015   ? PT Stop Time 1122   ? PT Time Calculation (min) 67 min   ? Activity Tolerance Patient limited by pain   ? Behavior During Therapy [facility] for tasks assessed/performed   ? ?  ?  ? ?  ? ? ?Past Medical History:  ?Diagnosis Date  ? Acne rosacea   ? Anxiety   ? Arthritis   ? arthritis in hip   ? Bronchiectasis ([place])   ? Cataract   ? beginning  ? Complication of anesthesia   ? "really sore throat"  ? Corneal dystrophy   ? DDD (degenerative disc disease), cervical   ? and lower back  ? Diverticulosis   ? Dysphagia   ? chronic- please see ultrasound done 4/16 15 in EPIC   ? Dysrhythmia   ? sometimes patient has extra beats per patient   ? Esophageal dysmotility   ? Family history of adverse reaction to anesthesia   ? mother slow to wake up   ? GERD (gastroesophageal reflux disease)   ? under control  ? Glaucoma   ? "suspect"  ? Gout   ? one finger x1 attack  ? H/O hiatal hernia   ? Hearing loss   ? bilateral due to nerve loss. wears hearing aides  ? Heart murmur   ? no problem   ? History of chicken pox   ? History of melanoma   ? melanoma- 1986 and 1997   ? Hypothyroidism   ? Low back pain with sciatica   ? Measles   ? hx of  ? Mitral valve prolapse   ? Nocturia   ? Osteoporosis   ? Peripheral neuropathy   ? feet  ? PONV (postoperative nausea and vomiting)   ? PTSD (post-traumatic stress disorder) 2006  ? RSD (reflex sympathetic dystrophy) 1993  ? Spider veins   ? Urinary incontinence   ? occasional  ? Varicose veins   ? ? ?Past Surgical History:   ?Procedure Laterality Date  ? ESOPHAGEAL MANOMETRY N/A 12/17/2015  ? Procedure: ESOPHAGEAL MANOMETRY (EM);  Surgeon: Mauri Pole, MD;  [facility];  Service: Endoscopy;  Laterality: N/A;  ? HARDWARE REMOVAL Left 10/17/2013  ? Procedure: HARDWARE REMOVAL LEFT HIP;  Surgeon: Gearlean Alf, MD;  [facility];  Service: Orthopedics;  Laterality: Left;  ? HIP FRACTURE SURGERY Left 2006  ? 3 screws  ? INSERTION OF MESH  02/08/2014  ? Procedure: INSERTION OF MESH;  Surgeon: Jackolyn Confer, MD;  [facility];  Service: General;;  ? melanoma surgery   1986, 1997  ? PARATHYROIDECTOMY  04/02/15  ? [place] IMPEDANCE STUDY N/A 12/17/2015  ? Procedure: [place] IMPEDANCE STUDY;  Surgeon: Mauri Pole, MD;  [facility];  Service: Endoscopy;  Laterality: N/A;  ? TOTAL HIP ARTHROPLASTY Left 04/10/2014  ? Procedure: LEFT TOTAL HIP ARTHROPLASTY ANTERIOR APPROACH;  Surgeon: Gearlean Alf, MD;  [facility];  Service: Orthopedics;  Laterality: Left;  ? UMBILICAL HERNIA REPAIR N/A  02/08/2014  ? Procedure: HERNIA REPAIR UMBILICAL ADULT WITH MESH;  Surgeon: Jackolyn Confer, MD;  [facility];  Service: General;  Laterality: N/A;  ? ? ?There were no vitals filed for this visit. ? ? Subjective Assessment - 05/23/21 1027   ? ? Subjective Patient still walking better, does have tightness and some soreness in teh left upper hip area   ? Currently in Pain? Yes   ? Pain Score 5    ? Pain Location Ankle   ? Pain Orientation Left   ? Pain Descriptors / Indicators Tightness;Sore   ? Aggravating Factors  walking   ? ?  ?  ? ?  ? ? ? ? ? ? ? ? ? ? ? ? ? ? ? ? ? ? ? ? [place] Adult PT Treatment/Exercise - 05/23/21 0001   ? ?  ? Ambulation/Gait  ? Gait Comments 4" steps step over step up and down   ?  ? Lumbar Exercises: Seated  ? Other Seated Lumbar Exercises red tband row and extension 2x15   ?  ? Lumbar Exercises: Supine  ? Clam 20 reps   ? Bent Knee Raise 15 reps   ? Bridge with [employer] 15 reps   ? Other  Supine Lumbar Exercises feet on ball K2C, trunk rotation, small bridges, isometrica abs   ?  ? Knee/Hip Exercises: Stretches  ? Gastroc Stretch Both;3 reps;20 seconds   ?  ? Knee/Hip Exercises: Aerobic  ? Nustep level 4 x 6 minutes   ?  ? Knee/Hip Exercises: Seated  ? Long [employer] Strengthening;Both;10 reps;Weights   ? Long Arc Quad Weight 3 lbs.   ? Ball Squeeze 20 reps   ? Other Seated Knee/Hip Exercises 4 way ankle DF/PF x10 each, inversoin/eversion x10 without band   ? Marching Left;2 sets;10 reps   ?  ? Knee/Hip Exercises: Supine  ? Short Arc [employer] Both;2 sets;10 reps   ? Other Supine Knee/Hip Exercises ankle pumps and circles in elevation   ?  ? Vasopneumatic  ? Number Minutes Vasopneumatic  10 minutes   ? Vasopnuematic Location  Knee   ? Vasopneumatic Pressure Medium   ? Vasopneumatic Temperature  35   ?  ? Manual Therapy  ? Manual therapy comments light touch STM to the left shin and ankel area   ? ?  ?  ? ?  ? ? ? ? ? ? ? ? ? ? ? ? PT Short Term Goals - 03/21/21 1005   ? ?  ? PT SHORT TERM GOAL #1  ? Title independent with HEP   ? Status Achieved   ? ?  ?  ? ?  ? ? ? ? PT Long Term Goals - 05/23/21 1141   ? ?  ? PT LONG TERM GOAL #1  ? Title go up and down stairs step over step consistently   ? Status Partially Met   ? ?  ?  ? ?  ? ? ? ? ? ? ? ? Plan - 05/23/21 1139   ? ? Clinical Impression Statement Patient still moving better than last week, she does c/o aches an dpains in the ankle, knee, hip and back, She still has significant swelling in the shin and knee area, vrery tender to touch, I am starting some light touch to this area to get the sensation and the tenderness under control, trying to assure the strength remains to some extent   ? PT Next Visit Plan  see if we can get the pain and swelling better, return to walking and exercises   ? Consulted and Agree with Plan of Care Patient   ? ?  ?  ? ?  ? ? ?Patient will benefit from skilled therapeutic intervention in order to improve the following  deficits and impairments:  Difficulty walking, Abnormal gait, Cardiopulmonary status limiting activity, Decreased endurance, Pain, Decreased activity tolerance, Decreased balance, Impaired flexibility, Decreased strength, Decreased mobility ? ?Visit Diagnosis: ?Muscle weakness (generalized) ? ?Pain in left hip ? ?Difficulty in walking, not elsewhere classified ? ?Localized edema ? ?Pain in left ankle and joints of left foot ? ?Chronic bilateral low back pain without sciatica ? ? ? ? ?Problem List ?Patient Active Problem List  ? Diagnosis Date Noted  ? Upper respiratory infection 01/15/2018  ? Postherpetic neuralgia 12/25/2016  ? Dysesthesia 12/25/2016  ? Polyneuropathy 12/25/2016  ? Exposure to influenza 04/01/2016  ? Dysphagia   ? Bronchiectasis without acute exacerbation ([place]) 08/30/2015  ? Bronchiectasis ([place]) 08/14/2015  ? Chronic rhinitis 08/14/2015  ? Avascular necrosis of femur head, left ([place]) 04/10/2014  ? OA (osteoarthritis) of hip 04/10/2014  ? Painful orthopaedic hardware [facility]) 10/17/2013  ? ? Sumner Boast, PT ?05/23/2021, 11:42 AM ? ?[place] ?[address] ?[place], [place], [address] ?Phone: [phone]   Fax:  [phone] ? ?Name: Brooke Jimenez ?MRN: [id number] ?Date of Birth: [birth year] ? ? ? ?

## 2021-05-28 ENCOUNTER — Ambulatory Visit: Payer: Medicare Other | Attending: Orthopedic Surgery | Admitting: Physical Therapy

## 2021-05-28 ENCOUNTER — Encounter: Payer: Self-pay | Admitting: Physical Therapy

## 2021-05-28 DIAGNOSIS — M25552 Pain in left hip: Secondary | ICD-10-CM | POA: Diagnosis present

## 2021-05-28 DIAGNOSIS — M6281 Muscle weakness (generalized): Secondary | ICD-10-CM | POA: Diagnosis present

## 2021-05-28 DIAGNOSIS — M25572 Pain in left ankle and joints of left foot: Secondary | ICD-10-CM | POA: Diagnosis present

## 2021-05-28 DIAGNOSIS — G8929 Other chronic pain: Secondary | ICD-10-CM | POA: Diagnosis present

## 2021-05-28 DIAGNOSIS — R262 Difficulty in walking, not elsewhere classified: Secondary | ICD-10-CM | POA: Insufficient documentation

## 2021-05-28 DIAGNOSIS — M545 Low back pain, unspecified: Secondary | ICD-10-CM | POA: Diagnosis present

## 2021-05-28 DIAGNOSIS — R600 Localized edema: Secondary | ICD-10-CM | POA: Diagnosis present

## 2021-05-28 NOTE — Therapy (Signed)
[place] ?[place] [address] ?[place], [place], [address] ?Phone: [phone]   Fax:  [phone] ? ?Physical Therapy Treatment ? ?Patient Details  ?Name: Brooke Jimenez ?MRN: [id number] ?Date of Birth: March 29, 1945 ?Referring Provider (PT): Rolena Infante ? ? ?Encounter Date: 05/28/2021 ? ? PT End of Session - 05/28/21 1144   ? ? Visit Number 18   ? Date for PT Re-Evaluation 06/21/21   ? Authorization Type Medicare   ? PT Start Time 1100   ? PT Stop Time 1200   ? PT Time Calculation (min) 60 min   ? Activity Tolerance Patient limited by pain   ? Behavior During Therapy [facility] for tasks assessed/performed   ? ?  ?  ? ?  ? ? ?Past Medical History:  ?Diagnosis Date  ? Acne rosacea   ? Anxiety   ? Arthritis   ? arthritis in hip   ? Bronchiectasis ([place])   ? Cataract   ? beginning  ? Complication of anesthesia   ? "really sore throat"  ? Corneal dystrophy   ? DDD (degenerative disc disease), cervical   ? and lower back  ? Diverticulosis   ? Dysphagia   ? chronic- please see ultrasound done 4/16 15 in EPIC   ? Dysrhythmia   ? sometimes patient has extra beats per patient   ? Esophageal dysmotility   ? Family history of adverse reaction to anesthesia   ? mother slow to wake up   ? GERD (gastroesophageal reflux disease)   ? under control  ? Glaucoma   ? "suspect"  ? Gout   ? one finger x1 attack  ? H/O hiatal hernia   ? Hearing loss   ? bilateral due to nerve loss. wears hearing aides  ? Heart murmur   ? no problem   ? History of chicken pox   ? History of melanoma   ? melanoma- 1986 and 1997   ? Hypothyroidism   ? Low back pain with sciatica   ? Measles   ? hx of  ? Mitral valve prolapse   ? Nocturia   ? Osteoporosis   ? Peripheral neuropathy   ? feet  ? PONV (postoperative nausea and vomiting)   ? PTSD (post-traumatic stress disorder) 2006  ? RSD (reflex sympathetic dystrophy) 1993  ? Spider veins   ? Urinary incontinence   ? occasional  ? Varicose veins   ? ? ?Past Surgical History:   ?Procedure Laterality Date  ? ESOPHAGEAL MANOMETRY N/A 12/17/2015  ? Procedure: ESOPHAGEAL MANOMETRY (EM);  Surgeon: Mauri Pole, MD;  [facility];  Service: Endoscopy;  Laterality: N/A;  ? HARDWARE REMOVAL Left 10/17/2013  ? Procedure: HARDWARE REMOVAL LEFT HIP;  Surgeon: Gearlean Alf, MD;  [facility];  Service: Orthopedics;  Laterality: Left;  ? HIP FRACTURE SURGERY Left 2006  ? 3 screws  ? INSERTION OF MESH  02/08/2014  ? Procedure: INSERTION OF MESH;  Surgeon: Jackolyn Confer, MD;  [facility];  Service: General;;  ? melanoma surgery   1986, 1997  ? PARATHYROIDECTOMY  04/02/15  ? [place] IMPEDANCE STUDY N/A 12/17/2015  ? Procedure: [place] IMPEDANCE STUDY;  Surgeon: Mauri Pole, MD;  [facility];  Service: Endoscopy;  Laterality: N/A;  ? TOTAL HIP ARTHROPLASTY Left 04/10/2014  ? Procedure: LEFT TOTAL HIP ARTHROPLASTY ANTERIOR APPROACH;  Surgeon: Gearlean Alf, MD;  [facility];  Service: Orthopedics;  Laterality: Left;  ? UMBILICAL HERNIA REPAIR N/A  02/08/2014  ? Procedure: HERNIA REPAIR UMBILICAL ADULT WITH MESH;  Surgeon: Jackolyn Confer, MD;  [facility];  Service: General;  Laterality: N/A;  ? ? ?There were no vitals filed for this visit. ? ? Subjective Assessment - 05/28/21 1107   ? ? Subjective I am doing better, still hurts to walk and bend the ankle.  Still hurting in the left hip and the back,   She became emotional today, she seemed to be overwhelmed with dealing with the back issues and then this fall and the issues with the leg that she has really suffered with   ? Currently in Pain? Yes   ? Pain Score 5    ? Pain Location Ankle   back  ? Pain Descriptors / Indicators Sore;Tightness   ? ?  ?  ? ?  ? ? ? ? ? ? ? ? ? ? ? ? ? ? ? ? ? ? ? ? [place] Adult PT Treatment/Exercise - 05/28/21 0001   ? ?  ? Lumbar Exercises: Supine  ? Clam 20 reps   ? Bent Knee Raise 15 reps   ? Bridge with [employer] 15 reps   ? Other Supine Lumbar Exercises feet on ball K2C,  trunk rotation, small bridges, isometrica abs   ?  ? Knee/Hip Exercises: Aerobic  ? Nustep level 4 x 6 minutes   ?  ? Knee/Hip Exercises: Seated  ? Other Seated Knee/Hip Exercises 4 way ankle DF/PF x10 each, inversoin/eversion x10 without band   ?  ? Vasopneumatic  ? Number Minutes Vasopneumatic  10 minutes   ? Vasopnuematic Location  Knee   ? Vasopneumatic Pressure Medium   ? Vasopneumatic Temperature  35   ?  ? Manual Therapy  ? Manual therapy comments light touch STM to the left shin and ankle area, tried to do some left hip and ITB STM with vibration but she could not tolerate due to very tender   ? ?  ?  ? ?  ? ? ? ? ? ? ? ? ? ? ? ? PT Short Term Goals - 03/21/21 1005   ? ?  ? PT SHORT TERM GOAL #1  ? Title independent with HEP   ? Status Achieved   ? ?  ?  ? ?  ? ? ? ? PT Long Term Goals - 05/23/21 1141   ? ?  ? PT LONG TERM GOAL #1  ? Title go up and down stairs step over step consistently   ? Status Partially Met   ? ?  ?  ? ?  ? ? ? ? ? ? ? ? Plan - 05/28/21 1145   ? ? Clinical Impression Statement Patient has been moving and walking better, however she became tearful as she reported pain in the back, the left hip and leg as well as the ankle and knee, she reports just overwhelmed with this fall and the additional issues. I tried some light STM with vibration but she really could not tolerate this very sensitive, The ankle and the shin area are still very swollen and tender,   ? PT Next Visit Plan continue to progress as she tolerates   ? Consulted and Agree with Plan of Care Patient   ? ?  ?  ? ?  ? ? ?Patient will benefit from skilled therapeutic intervention in order to improve the following deficits and impairments:  Difficulty walking, Abnormal gait, Cardiopulmonary status limiting activity, Decreased endurance, Pain, Decreased activity  tolerance, Decreased balance, Impaired flexibility, Decreased strength, Decreased mobility ? ?Visit Diagnosis: ?Muscle weakness (generalized) ? ?Pain in left  hip ? ?Difficulty in walking, not elsewhere classified ? ?Localized edema ? ?Pain in left ankle and joints of left foot ? ?Chronic bilateral low back pain without sciatica ? ? ? ? ?Problem List ?Patient Active Problem List  ? Diagnosis Date Noted  ? Upper respiratory infection 01/15/2018  ? Postherpetic neuralgia 12/25/2016  ? Dysesthesia 12/25/2016  ? Polyneuropathy 12/25/2016  ? Exposure to influenza 04/01/2016  ? Dysphagia   ? Bronchiectasis without acute exacerbation ([place]) 08/30/2015  ? Bronchiectasis ([place]) 08/14/2015  ? Chronic rhinitis 08/14/2015  ? Avascular necrosis of femur head, left ([place]) 04/10/2014  ? OA (osteoarthritis) of hip 04/10/2014  ? Painful orthopaedic hardware [facility]) 10/17/2013  ? ? Sumner Boast, PT ?05/28/2021, 11:48 AM ? ?[place] ?[address] ?[place], [place], [address] ?Phone: [phone]   Fax:  [phone] ? ?Name: Brooke Jimenez ?MRN: [id number] ?Date of Birth: January 25, 1946 ? ? ? ?

## 2021-05-30 ENCOUNTER — Ambulatory Visit: Payer: Medicare Other | Admitting: Physical Therapy

## 2021-05-30 ENCOUNTER — Encounter: Payer: Self-pay | Admitting: Physical Therapy

## 2021-05-30 DIAGNOSIS — M25552 Pain in left hip: Secondary | ICD-10-CM

## 2021-05-30 DIAGNOSIS — R600 Localized edema: Secondary | ICD-10-CM

## 2021-05-30 DIAGNOSIS — M6281 Muscle weakness (generalized): Secondary | ICD-10-CM

## 2021-05-30 DIAGNOSIS — M25572 Pain in left ankle and joints of left foot: Secondary | ICD-10-CM

## 2021-05-30 DIAGNOSIS — R262 Difficulty in walking, not elsewhere classified: Secondary | ICD-10-CM

## 2021-05-30 NOTE — Therapy (Signed)
[place] ?[place] [address] ?[place], [place], [address] ?Phone: [phone]   Fax:  [phone] ? ?Physical Therapy Treatment ? ?Patient Details  ?Name: Brooke Jimenez ?MRN: [id number] ?Date of Birth: 06-12-1945 ?Referring Provider (PT): Rolena Infante ? ? ?Encounter Date: 05/30/2021 ? ? PT End of Session - 05/30/21 1558   ? ? Visit Number 19   ? Date for PT Re-Evaluation 06/21/21   ? Authorization Type Medicare   ? PT Start Time 1520   ? PT Stop Time 4888   ? PT Time Calculation (min) 58 min   ? Activity Tolerance Patient limited by pain;Patient tolerated treatment well   ? Behavior During Therapy [facility] for tasks assessed/performed   ? ?  ?  ? ?  ? ? ?Past Medical History:  ?Diagnosis Date  ? Acne rosacea   ? Anxiety   ? Arthritis   ? arthritis in hip   ? Bronchiectasis ([place])   ? Cataract   ? beginning  ? Complication of anesthesia   ? "really sore throat"  ? Corneal dystrophy   ? DDD (degenerative disc disease), cervical   ? and lower back  ? Diverticulosis   ? Dysphagia   ? chronic- please see ultrasound done 4/16 15 in EPIC   ? Dysrhythmia   ? sometimes patient has extra beats per patient   ? Esophageal dysmotility   ? Family history of adverse reaction to anesthesia   ? mother slow to wake up   ? GERD (gastroesophageal reflux disease)   ? under control  ? Glaucoma   ? "suspect"  ? Gout   ? one finger x1 attack  ? H/O hiatal hernia   ? Hearing loss   ? bilateral due to nerve loss. wears hearing aides  ? Heart murmur   ? no problem   ? History of chicken pox   ? History of melanoma   ? melanoma- 1986 and 1997   ? Hypothyroidism   ? Low back pain with sciatica   ? Measles   ? hx of  ? Mitral valve prolapse   ? Nocturia   ? Osteoporosis   ? Peripheral neuropathy   ? feet  ? PONV (postoperative nausea and vomiting)   ? PTSD (post-traumatic stress disorder) 2006  ? RSD (reflex sympathetic dystrophy) 1993  ? Spider veins   ? Urinary incontinence   ? occasional  ? Varicose veins    ? ? ?Past Surgical History:  ?Procedure Laterality Date  ? ESOPHAGEAL MANOMETRY N/A 12/17/2015  ? Procedure: ESOPHAGEAL MANOMETRY (EM);  Surgeon: Mauri Pole, MD;  [facility];  Service: Endoscopy;  Laterality: N/A;  ? HARDWARE REMOVAL Left 10/17/2013  ? Procedure: HARDWARE REMOVAL LEFT HIP;  Surgeon: Gearlean Alf, MD;  [facility];  Service: Orthopedics;  Laterality: Left;  ? HIP FRACTURE SURGERY Left 2006  ? 3 screws  ? INSERTION OF MESH  02/08/2014  ? Procedure: INSERTION OF MESH;  Surgeon: Jackolyn Confer, MD;  [facility];  Service: General;;  ? melanoma surgery   1986, 1997  ? PARATHYROIDECTOMY  04/02/15  ? [place] IMPEDANCE STUDY N/A 12/17/2015  ? Procedure: [place] IMPEDANCE STUDY;  Surgeon: Mauri Pole, MD;  [facility];  Service: Endoscopy;  Laterality: N/A;  ? TOTAL HIP ARTHROPLASTY Left 04/10/2014  ? Procedure: LEFT TOTAL HIP ARTHROPLASTY ANTERIOR APPROACH;  Surgeon: Gearlean Alf, MD;  [facility];  Service: Orthopedics;  Laterality: Left;  ? UMBILICAL  HERNIA REPAIR N/A 02/08/2014  ? Procedure: HERNIA REPAIR UMBILICAL ADULT WITH MESH;  Surgeon: Jackolyn Confer, MD;  [facility];  Service: General;  Laterality: N/A;  ? ? ?There were no vitals filed for this visit. ? ? Subjective Assessment - 05/30/21 1531   ? ? Subjective Reports that she saw the MD and that he felt everything she was experiencing was normal, still very swollen, having to wear a house shoe   ? Currently in Pain? Yes   ? Pain Score 5    ? Pain Location Ankle   ? Pain Orientation Left   ? Aggravating Factors  walking   ? Pain Relieving Factors the game ready "really helps"   ? ?  ?  ? ?  ? ? ? ? ? ? ? ? ? ? ? ? ? ? ? ? ? ? ? ? [place] Adult PT Treatment/Exercise - 05/30/21 0001   ? ?  ? Lumbar Exercises: Aerobic  ? UBE (Upper Arm Bike) level 1 x 4 minutes   ? Nustep level 4 x 6 minutes   ?  ? Lumbar Exercises: Machines for Strengthening  ? Leg Press 20# 2x10   ? Other Lumbar Machine Exercise  seated row 15# 2x10, lats 20# 2x10   ?  ? Lumbar Exercises: Supine  ? Other Supine Lumbar Exercises feet on ball K2C, trunk rotation, small bridges, isometrica abs   ?  ? Vasopneumatic  ? Number Minutes Vasopneumatic  10 minutes   ? Vasopnuematic Location  Knee   ? Vasopneumatic Pressure Medium   ? Vasopneumatic Temperature  35   ? ?  ?  ? ?  ? ? ? ? ? ? ? ? ? ? ? ? PT Short Term Goals - 03/21/21 1005   ? ?  ? PT SHORT TERM GOAL #1  ? Title independent with HEP   ? Status Achieved   ? ?  ?  ? ?  ? ? ? ? PT Long Term Goals - 05/30/21 1600   ? ?  ? PT LONG TERM GOAL #1  ? Title go up and down stairs step over step consistently   ? Status Partially Met   ?  ? PT LONG TERM GOAL #2  ? Title return to independent gym program   ? Status Partially Met   ?  ? PT LONG TERM GOAL #3  ? Title walk without device with minimal deviation   ? Status Partially Met   ? ?  ?  ? ?  ? ? ? ? ? ? ? ? Plan - 05/30/21 1559   ? ? Clinical Impression Statement Patient doing better today, less pain and walking better, we went back to working on the exercises for the back and slowly trying to add the leg exercises she seemed to tolerate well today, some pain in the ankle and the low back as we added exercises back   ? PT Next Visit Plan see how she tolerated the reintroduction of gym activities   ? Consulted and Agree with Plan of Care Patient   ? ?  ?  ? ?  ? ? ?Patient will benefit from skilled therapeutic intervention in order to improve the following deficits and impairments:  Difficulty walking, Abnormal gait, Cardiopulmonary status limiting activity, Decreased endurance, Pain, Decreased activity tolerance, Decreased balance, Impaired flexibility, Decreased strength, Decreased mobility ? ?Visit Diagnosis: ?Muscle weakness (generalized) ? ?Pain in left hip ? ?Difficulty in walking, not elsewhere classified ? ?Localized  edema ? ?Pain in left ankle and joints of left foot ? ? ? ? ?Problem List ?Patient Active Problem List  ? Diagnosis Date  Noted  ? Upper respiratory infection 01/15/2018  ? Postherpetic neuralgia 12/25/2016  ? Dysesthesia 12/25/2016  ? Polyneuropathy 12/25/2016  ? Exposure to influenza 04/01/2016  ? Dysphagia   ? Bronchiectasis without acute exacerbation ([place]) 08/30/2015  ? Bronchiectasis ([place]) 08/14/2015  ? Chronic rhinitis 08/14/2015  ? Avascular necrosis of femur head, left ([place]) 04/10/2014  ? OA (osteoarthritis) of hip 04/10/2014  ? Painful orthopaedic hardware [facility]) 10/17/2013  ? ? Sumner Boast, PT ?05/30/2021, 4:00 PM ? ?[place] ?[address] ?[address] ?[place], [place], [address] ?Phone: [phone]   Fax:  [phone] ? ?Name: Brooke Jimenez ?MRN: [id number] ?Date of Birth: [birth year] ? ? ? ?

## 2021-06-03 ENCOUNTER — Encounter: Payer: Self-pay | Admitting: Physical Therapy

## 2021-06-03 ENCOUNTER — Ambulatory Visit: Payer: Medicare Other | Admitting: Physical Therapy

## 2021-06-03 DIAGNOSIS — R600 Localized edema: Secondary | ICD-10-CM

## 2021-06-03 DIAGNOSIS — M6281 Muscle weakness (generalized): Secondary | ICD-10-CM | POA: Diagnosis not present

## 2021-06-03 DIAGNOSIS — M25572 Pain in left ankle and joints of left foot: Secondary | ICD-10-CM

## 2021-06-03 DIAGNOSIS — R262 Difficulty in walking, not elsewhere classified: Secondary | ICD-10-CM

## 2021-06-03 DIAGNOSIS — M25552 Pain in left hip: Secondary | ICD-10-CM

## 2021-06-03 NOTE — Therapy (Signed)
[place] ?[place] ?[address] ?[place], [place], [address] ?Phone: [phone]   Fax:  [phone] ?Progress Note ?Reporting Period 04/23/21 to 06/03/21 for visits 11-20 ? ?See note below for Objective Data and Assessment of Progress/Goals.  ? ?  ?Physical Therapy Treatment ? ?Patient Details  ?Name: Brooke Jimenez ?MRN: [id number] ?Date of Birth: April 07, 1945 ?Referring Provider (PT): Rolena Infante ? ? ?Encounter Date: 06/03/2021 ? ? PT End of Session - 06/03/21 1148   ? ? Visit Number 20   ? Date for PT Re-Evaluation 06/21/21   ? Authorization Type Medicare   ? PT Start Time 1100   ? PT Stop Time 1205   ? PT Time Calculation (min) 65 min   ? Activity Tolerance Patient tolerated treatment well;Patient limited by fatigue   ? Behavior During Therapy [facility] for tasks assessed/performed   ? ?  ?  ? ?  ? ? ?Past Medical History:  ?Diagnosis Date  ? Acne rosacea   ? Anxiety   ? Arthritis   ? arthritis in hip   ? Bronchiectasis ([place])   ? Cataract   ? beginning  ? Complication of anesthesia   ? "really sore throat"  ? Corneal dystrophy   ? DDD (degenerative disc disease), cervical   ? and lower back  ? Diverticulosis   ? Dysphagia   ? chronic- please see ultrasound done 4/16 15 in EPIC   ? Dysrhythmia   ? sometimes patient has extra beats per patient   ? Esophageal dysmotility   ? Family history of adverse reaction to anesthesia   ? mother slow to wake up   ? GERD (gastroesophageal reflux disease)   ? under control  ? Glaucoma   ? "suspect"  ? Gout   ? one finger x1 attack  ? H/O hiatal hernia   ? Hearing loss   ? bilateral due to nerve loss. wears hearing aides  ? Heart murmur   ? no problem   ? History of chicken pox   ? History of melanoma   ? melanoma- 1986 and 1997   ? Hypothyroidism   ? Low back pain with sciatica   ? Measles   ? hx of  ? Mitral valve prolapse   ? Nocturia   ? Osteoporosis   ? Peripheral neuropathy   ? feet  ? PONV (postoperative nausea and vomiting)   ? PTSD  (post-traumatic stress disorder) 2006  ? RSD (reflex sympathetic dystrophy) 1993  ? Spider veins   ? Urinary incontinence   ? occasional  ? Varicose veins   ? ? ?Past Surgical History:  ?Procedure Laterality Date  ? ESOPHAGEAL MANOMETRY N/A 12/17/2015  ? Procedure: ESOPHAGEAL MANOMETRY (EM);  Surgeon: Mauri Pole, MD;  [facility];  Service: Endoscopy;  Laterality: N/A;  ? HARDWARE REMOVAL Left 10/17/2013  ? Procedure: HARDWARE REMOVAL LEFT HIP;  Surgeon: Gearlean Alf, MD;  [facility];  Service: Orthopedics;  Laterality: Left;  ? HIP FRACTURE SURGERY Left 2006  ? 3 screws  ? INSERTION OF MESH  02/08/2014  ? Procedure: INSERTION OF MESH;  Surgeon: Jackolyn Confer, MD;  [facility];  Service: General;;  ? melanoma surgery   1986, 1997  ? PARATHYROIDECTOMY  04/02/15  ? [place] IMPEDANCE STUDY N/A 12/17/2015  ? Procedure: [place] IMPEDANCE STUDY;  Surgeon: Mauri Pole, MD;  [facility];  Service: Endoscopy;  Laterality: N/A;  ? TOTAL HIP ARTHROPLASTY Left 04/10/2014  ? Procedure: LEFT  TOTAL HIP ARTHROPLASTY ANTERIOR APPROACH;  Surgeon: Gearlean Alf, MD;  [facility];  Service: Orthopedics;  Laterality: Left;  ? UMBILICAL HERNIA REPAIR N/A 02/08/2014  ? Procedure: HERNIA REPAIR UMBILICAL ADULT WITH MESH;  Surgeon: Jackolyn Confer, MD;  [facility];  Service: General;  Laterality: N/A;  ? ? ?There were no vitals filed for this visit. ? ? Subjective Assessment - 06/03/21 1110   ? ? Subjective I was very sore all over but that resolved wihtout much problem, I finally got into a shoe today, it is tight but I am not in my slipper anymore   ? Currently in Pain? Yes   ? Pain Score 4    ? Pain Location Ankle   ? Pain Orientation Left   ? ?  ?  ? ?  ? ? ? ? ? ? ? ? ? ? ? ? ? ? ? ? ? ? ? ? [place] Adult PT Treatment/Exercise - 06/03/21 0001   ? ?  ? High Level Balance  ? High Level Balance Comments on airex reaching, head turns, eyes closed, feet together   ?  ? Lumbar Exercises:  Aerobic  ? UBE (Upper Arm Bike) level 2 x 4 minutes   ? Recumbent Bike level 1 x 5 minutes   ?  ? Lumbar Exercises: Machines for Strengthening  ? Other Lumbar Machine Exercise seated row 15# 2x10, lats 20# 2x10   ? Other Lumbar Machine Exercise 5# straight arm pulls   ?  ? Vasopneumatic  ? Number Minutes Vasopneumatic  10 minutes   ? Vasopnuematic Location  Knee   ? Vasopneumatic Pressure Medium   ? Vasopneumatic Temperature  35   ? ?  ?  ? ?  ? ? ? ? ? ? ? ? ? ? ? ? PT Short Term Goals - 03/21/21 1005   ? ?  ? PT SHORT TERM GOAL #1  ? Title independent with HEP   ? Status Achieved   ? ?  ?  ? ?  ? ? ? ? PT Long Term Goals - 06/03/21 1150   ? ?  ? PT LONG TERM GOAL #1  ? Title go up and down stairs step over step consistently   ? Status Partially Met   ?  ? PT LONG TERM GOAL #2  ? Title return to independent gym program   ? Status Partially Met   ?  ? PT LONG TERM GOAL #3  ? Title walk without device with minimal deviation   ? Status Partially Met   ? ?  ?  ? ?  ? ? ? ? ? ? ? ? Plan - 06/03/21 1148   ? ? Clinical Impression Statement Patient continues to be walking better, she still has significant swelling in the ankle and the knee.  Has difficulty balancing on the airex, she was very sore after last treatment so I did a little more balance today.   ? PT Next Visit Plan continue to work on her goals of walking and being able to do stairs and do an exercise class   ? Consulted and Agree with Plan of Care Patient   ? ?  ?  ? ?  ? ? ?Patient will benefit from skilled therapeutic intervention in order to improve the following deficits and impairments:  Difficulty walking, Abnormal gait, Cardiopulmonary status limiting activity, Decreased endurance, Pain, Decreased activity tolerance, Decreased balance, Impaired flexibility, Decreased strength, Decreased mobility ? ?Visit Diagnosis: ?Muscle  weakness (generalized) ? ?Pain in left hip ? ?Difficulty in walking, not elsewhere classified ? ?Localized edema ? ?Pain in left  ankle and joints of left foot ? ? ? ? ?Problem List ?Patient Active Problem List  ? Diagnosis Date Noted  ? Upper respiratory infection 01/15/2018  ? Postherpetic neuralgia 12/25/2016  ? Dysesthesia 12/25/2016  ? Polyneuropathy 12/25/2016  ? Exposure to influenza 04/01/2016  ? Dysphagia   ? Bronchiectasis without acute exacerbation ([place]) 08/30/2015  ? Bronchiectasis ([place]) 08/14/2015  ? Chronic rhinitis 08/14/2015  ? Avascular necrosis of femur head, left ([place]) 04/10/2014  ? OA (osteoarthritis) of hip 04/10/2014  ? Painful orthopaedic hardware [facility]) 10/17/2013  ? ? Sumner Boast, PT ?06/03/2021, 11:50 AM ? ?[place] ?[place] ?[address] ?[place], [place], [address] ?Phone: [phone]   Fax:  [phone] ? ?Name: Audri Kozub ?MRN: [id number] ?Date of Birth: 12/31/1945 ? ? ? ?

## 2021-06-05 ENCOUNTER — Encounter: Payer: Self-pay | Admitting: Physical Therapy

## 2021-06-05 ENCOUNTER — Ambulatory Visit: Payer: Medicare Other | Admitting: Physical Therapy

## 2021-06-05 DIAGNOSIS — R262 Difficulty in walking, not elsewhere classified: Secondary | ICD-10-CM

## 2021-06-05 DIAGNOSIS — M6281 Muscle weakness (generalized): Secondary | ICD-10-CM | POA: Diagnosis not present

## 2021-06-05 DIAGNOSIS — M25572 Pain in left ankle and joints of left foot: Secondary | ICD-10-CM

## 2021-06-05 DIAGNOSIS — R600 Localized edema: Secondary | ICD-10-CM

## 2021-06-05 DIAGNOSIS — M25552 Pain in left hip: Secondary | ICD-10-CM

## 2021-06-05 NOTE — Therapy (Signed)
[place] ?[place] ?[address] ?[place], [place], [address] ?Phone: [phone]   Fax:  [phone] ? ?Physical Therapy Treatment ? ?Patient Details  ?Name: Brooke Jimenez ?MRN: [id number] ?Date of Birth: 28-Jan-1946 ?Referring Provider (PT): Rolena Infante ? ? ?Encounter Date: 06/05/2021 ? ? PT End of Session - 06/05/21 1056   ? ? Visit Number 21   ? Date for PT Re-Evaluation 06/21/21   ? Authorization Type Medicare   ? PT Start Time 1012   ? PT Stop Time 1115   ? PT Time Calculation (min) 63 min   ? Activity Tolerance Patient tolerated treatment well;Patient limited by fatigue   ? Behavior During Therapy [facility] for tasks assessed/performed   ? ?  ?  ? ?  ? ? ?Past Medical History:  ?Diagnosis Date  ? Acne rosacea   ? Anxiety   ? Arthritis   ? arthritis in hip   ? Bronchiectasis ([place])   ? Cataract   ? beginning  ? Complication of anesthesia   ? "really sore throat"  ? Corneal dystrophy   ? DDD (degenerative disc disease), cervical   ? and lower back  ? Diverticulosis   ? Dysphagia   ? chronic- please see ultrasound done 4/16 15 in EPIC   ? Dysrhythmia   ? sometimes patient has extra beats per patient   ? Esophageal dysmotility   ? Family history of adverse reaction to anesthesia   ? mother slow to wake up   ? GERD (gastroesophageal reflux disease)   ? under control  ? Glaucoma   ? "suspect"  ? Gout   ? one finger x1 attack  ? H/O hiatal hernia   ? Hearing loss   ? bilateral due to nerve loss. wears hearing aides  ? Heart murmur   ? no problem   ? History of chicken pox   ? History of melanoma   ? melanoma- 1986 and 1997   ? Hypothyroidism   ? Low back pain with sciatica   ? Measles   ? hx of  ? Mitral valve prolapse   ? Nocturia   ? Osteoporosis   ? Peripheral neuropathy   ? feet  ? PONV (postoperative nausea and vomiting)   ? PTSD (post-traumatic stress disorder) 2006  ? RSD (reflex sympathetic dystrophy) 1993  ? Spider veins   ? Urinary incontinence   ? occasional  ? Varicose  veins   ? ? ?Past Surgical History:  ?Procedure Laterality Date  ? ESOPHAGEAL MANOMETRY N/A 12/17/2015  ? Procedure: ESOPHAGEAL MANOMETRY (EM);  Surgeon: Mauri Pole, MD;  [facility];  Service: Endoscopy;  Laterality: N/A;  ? HARDWARE REMOVAL Left 10/17/2013  ? Procedure: HARDWARE REMOVAL LEFT HIP;  Surgeon: Gearlean Alf, MD;  [facility];  Service: Orthopedics;  Laterality: Left;  ? HIP FRACTURE SURGERY Left 2006  ? 3 screws  ? INSERTION OF MESH  02/08/2014  ? Procedure: INSERTION OF MESH;  Surgeon: Jackolyn Confer, MD;  [facility];  Service: General;;  ? melanoma surgery   1986, 1997  ? PARATHYROIDECTOMY  04/02/15  ? [place] IMPEDANCE STUDY N/A 12/17/2015  ? Procedure: [place] IMPEDANCE STUDY;  Surgeon: Mauri Pole, MD;  [facility];  Service: Endoscopy;  Laterality: N/A;  ? TOTAL HIP ARTHROPLASTY Left 04/10/2014  ? Procedure: LEFT TOTAL HIP ARTHROPLASTY ANTERIOR APPROACH;  Surgeon: Gearlean Alf, MD;  [facility];  Service: Orthopedics;  Laterality: Left;  ? UMBILICAL  HERNIA REPAIR N/A 02/08/2014  ? Procedure: HERNIA REPAIR UMBILICAL ADULT WITH MESH;  Surgeon: Jackolyn Confer, MD;  [facility];  Service: General;  Laterality: N/A;  ? ? ?There were no vitals filed for this visit. ? ? Subjective Assessment - 06/05/21 1014   ? ? Subjective I was sore again but it goes away. I am walking better but the leg still swells   ? Currently in Pain? Yes   ? Pain Score 3    ? Pain Location Ankle   ? Pain Orientation Left   ? Pain Descriptors / Indicators Aching;Tightness   ? Aggravating Factors  walking   ? ?  ?  ? ?  ? ? ? ? ? ? ? ? ? ? ? ? ? ? ? ? ? ? ? ? [place] Adult PT Treatment/Exercise - 06/05/21 0001   ? ?  ? Ambulation/Gait  ? Gait Comments outside around building needed 3 rest breaks due to fatigue and SOB   ?  ? Lumbar Exercises: Stretches  ? Piriformis Stretch Left;3 reps;20 seconds   ?  ? Lumbar Exercises: Aerobic  ? UBE (Upper Arm Bike) level 2 x 4 minutes   ?  ?  Lumbar Exercises: Supine  ? Bridge with [employer] 15 reps   ? Bridge with clamshell 15 reps   ? Other Supine Lumbar Exercises feet on ball K2C, trunk rotation, small bridges, isometrica abs   ?  ? Knee/Hip Exercises: Standing  ? Hip Abduction Both;1 set;10 reps   ? Abduction Limitations 2.5#   ? Hip Extension Both;10 reps   ? Extension Limitations 2.5#   ?  ? Vasopneumatic  ? Number Minutes Vasopneumatic  10 minutes   ? Vasopnuematic Location  Knee   ? Vasopneumatic Pressure Medium   ? Vasopneumatic Temperature  35   ? ?  ?  ? ?  ? ? ? ? ? ? ? ? ? ? ? ? PT Short Term Goals - 03/21/21 1005   ? ?  ? PT SHORT TERM GOAL #1  ? Title independent with HEP   ? Status Achieved   ? ?  ?  ? ?  ? ? ? ? PT Long Term Goals - 06/05/21 1058   ? ?  ? PT LONG TERM GOAL #1  ? Title go up and down stairs step over step consistently   ? Status Partially Met   ?  ? PT LONG TERM GOAL #2  ? Title return to independent gym program   ? Status Partially Met   ?  ? PT LONG TERM GOAL #3  ? Title walk without device with minimal deviation   ? Status Partially Met   ? ?  ?  ? ?  ? ? ? ? ? ? ? ? Plan - 06/05/21 1057   ? ? Clinical Impression Statement Patient continues to walk better, we went back and tried an outside walk, she did it but needed three standing rest breaks, very SOB and fatigued.  We tried standing hip abd/extension, this stance phase on the left did cause left thigh and lateral hip pain   ? PT Next Visit Plan continue to work on her goals of walking and being able to do stairs and do an exercise class   ? Consulted and Agree with Plan of Care Patient   ? ?  ?  ? ?  ? ? ?Patient will benefit from skilled therapeutic intervention in order to improve the following  deficits and impairments:  Difficulty walking, Abnormal gait, Cardiopulmonary status limiting activity, Decreased endurance, Pain, Decreased activity tolerance, Decreased balance, Impaired flexibility, Decreased strength, Decreased mobility ? ?Visit Diagnosis: ?Muscle  weakness (generalized) ? ?Pain in left hip ? ?Difficulty in walking, not elsewhere classified ? ?Localized edema ? ?Pain in left ankle and joints of left foot ? ? ? ? ?Problem List ?Patient Active Problem List  ? Diagnosis Date Noted  ? Upper respiratory infection 01/15/2018  ? Postherpetic neuralgia 12/25/2016  ? Dysesthesia 12/25/2016  ? Polyneuropathy 12/25/2016  ? Exposure to influenza 04/01/2016  ? Dysphagia   ? Bronchiectasis without acute exacerbation ([place]) 08/30/2015  ? Bronchiectasis ([place]) 08/14/2015  ? Chronic rhinitis 08/14/2015  ? Avascular necrosis of femur head, left ([place]) 04/10/2014  ? OA (osteoarthritis) of hip 04/10/2014  ? Painful orthopaedic hardware [facility]) 10/17/2013  ? ? Sumner Boast, PT ?06/05/2021, 10:58 AM ? ?[place] ?[address] ?[address] ?[place], [place], [address] ?Phone: [phone]   Fax:  [phone] ? ?Name: Angelette Ganus ?MRN: [id number] ?Date of Birth: [birth year] ? ? ? ?

## 2021-06-11 ENCOUNTER — Ambulatory Visit: Payer: Medicare Other | Admitting: Physical Therapy

## 2021-06-11 DIAGNOSIS — M25552 Pain in left hip: Secondary | ICD-10-CM

## 2021-06-11 DIAGNOSIS — M6281 Muscle weakness (generalized): Secondary | ICD-10-CM

## 2021-06-11 DIAGNOSIS — M25572 Pain in left ankle and joints of left foot: Secondary | ICD-10-CM

## 2021-06-11 DIAGNOSIS — R600 Localized edema: Secondary | ICD-10-CM

## 2021-06-11 DIAGNOSIS — R262 Difficulty in walking, not elsewhere classified: Secondary | ICD-10-CM

## 2021-06-11 NOTE — Therapy (Signed)
[facility] ?[address] ?[place], [place], [address] ?Phone: [phone]   Fax:  [phone] ? ?Physical Therapy Treatment ? ?Patient Details  ?Name: Brooke Jimenez ?MRN: [id number] ?Date of Birth: 01-24-1946 ?Referring Provider (PT): Rolena Infante ? ? ?Encounter Date: 06/11/2021 ? ? PT End of Session - 06/11/21 1056   ? ? Visit Number 22   ? Date for PT Re-Evaluation 06/21/21   ? Authorization Type Medicare   ? PT Start Time 1015   ? PT Stop Time 1115   ? PT Time Calculation (min) 60 min   ? ?  ?  ? ?  ? ? ?Past Medical History:  ?Diagnosis Date  ? Acne rosacea   ? Anxiety   ? Arthritis   ? arthritis in hip   ? Bronchiectasis ([place])   ? Cataract   ? beginning  ? Complication of anesthesia   ? "really sore throat"  ? Corneal dystrophy   ? DDD (degenerative disc disease), cervical   ? and lower back  ? Diverticulosis   ? Dysphagia   ? chronic- please see ultrasound done 4/16 15 in EPIC   ? Dysrhythmia   ? sometimes patient has extra beats per patient   ? Esophageal dysmotility   ? Family history of adverse reaction to anesthesia   ? mother slow to wake up   ? GERD (gastroesophageal reflux disease)   ? under control  ? Glaucoma   ? "suspect"  ? Gout   ? one finger x1 attack  ? H/O hiatal hernia   ? Hearing loss   ? bilateral due to nerve loss. wears hearing aides  ? Heart murmur   ? no problem   ? History of chicken pox   ? History of melanoma   ? melanoma- 1986 and 1997   ? Hypothyroidism   ? Low back pain with sciatica   ? Measles   ? hx of  ? Mitral valve prolapse   ? Nocturia   ? Osteoporosis   ? Peripheral neuropathy   ? feet  ? PONV (postoperative nausea and vomiting)   ? PTSD (post-traumatic stress disorder) 2006  ? RSD (reflex sympathetic dystrophy) 1993  ? Spider veins   ? Urinary incontinence   ? occasional  ? Varicose veins   ? ? ?Past Surgical History:  ?Procedure Laterality Date  ? ESOPHAGEAL MANOMETRY N/A 12/17/2015  ? Procedure: ESOPHAGEAL MANOMETRY (EM);   Surgeon: Mauri Pole, MD;  [facility];  Service: Endoscopy;  Laterality: N/A;  ? HARDWARE REMOVAL Left 10/17/2013  ? Procedure: HARDWARE REMOVAL LEFT HIP;  Surgeon: Gearlean Alf, MD;  [facility];  Service: Orthopedics;  Laterality: Left;  ? HIP FRACTURE SURGERY Left 2006  ? 3 screws  ? INSERTION OF MESH  02/08/2014  ? Procedure: INSERTION OF MESH;  Surgeon: Jackolyn Confer, MD;  [facility];  Service: General;;  ? melanoma surgery   1986, 1997  ? PARATHYROIDECTOMY  04/02/15  ? [place] IMPEDANCE STUDY N/A 12/17/2015  ? Procedure: [place] IMPEDANCE STUDY;  Surgeon: Mauri Pole, MD;  [facility];  Service: Endoscopy;  Laterality: N/A;  ? TOTAL HIP ARTHROPLASTY Left 04/10/2014  ? Procedure: LEFT TOTAL HIP ARTHROPLASTY ANTERIOR APPROACH;  Surgeon: Gearlean Alf, MD;  [facility];  Service: Orthopedics;  Laterality: Left;  ? UMBILICAL HERNIA REPAIR N/A 02/08/2014  ? Procedure: HERNIA REPAIR UMBILICAL ADULT WITH MESH;  Surgeon: Jackolyn Confer, MD;  [facility];  Service: General;  Laterality: N/A;  ? ? ?There were no vitals filed for this visit. ? ? Subjective Assessment - 06/11/21 1015   ? ? Subjective rode bike this morning and walked already- trying to loosen sore. sore after last session ( did [facility] same day). soreness gets better. concerned with red semi circle on leg rec taking picture and my charting to MD   ? Currently in Pain? Yes   ? Pain Score 5    ? Pain Location Ankle   ? Pain Orientation Left   ? ?  ?  ? ?  ? ? ? ? ? ? ? ? ? ? ? ? ? ? ? ? ? ? ? ? [place] [facility] Adult PT Treatment/Exercise - 06/11/21 0001   ? ?  ? Lumbar Exercises: Supine  ? Bridge with [employer] 15 reps   ? Bridge with clamshell 15 reps   ? Other Supine Lumbar Exercises feet on ball K2C, trunk rotation, small bridges, isometrica abs   ?  ? Knee/Hip Exercises: Aerobic  ? Nustep L 5 72mn   ?  ? Knee/Hip Exercises: Standing  ? Heel Raises Both   black bar then toe raises 10 x  ? Hip Flexion  Stengthening;Both;10 reps   ? Hip Flexion Limitations 2.5   ? Hip Abduction Both;1 set;10 reps   ? Abduction Limitations 2.5   ? Hip Extension Both;10 reps   ? Extension Limitations 2.5   ?  ? Knee/Hip Exercises: Seated  ? Long ACSX CorporationStrengthening;Both;10 reps;Weights   ? Long Arc Quad Weight 3 lbs.   ? Ball Squeeze 15x   ? Clamshell with Thera[employer] Green   10 x BIL 10 X SL  ? Marching Strengthening;Both;2 sets;10 reps   green tband  ? Sit to SGeneral Electric5 reps   ?  ? Vasopneumatic  ? Number Minutes Vasopneumatic  10 minutes   ? Vasopnuematic Location  Knee   ? Vasopneumatic Pressure Medium   ? Vasopneumatic Temperature  35   ? ?  ?  ? ?  ? ? ? ? ? ? ? ? ? ? ? ? PT Short Term Goals - 03/21/21 1005   ? ?  ? PT SHORT TERM GOAL #1  ? Title independent with HEP   ? Status Achieved   ? ?  ?  ? ?  ? ? ? ? PT Long Term Goals - 06/11/21 1053   ? ?  ? PT LONG TERM GOAL #1  ? Title go up and down stairs step over step consistently   ? Status Partially Met   ?  ? PT LONG TERM GOAL #2  ? Title return to independent gym program   ? Status Partially Met   ?  ? PT LONG TERM GOAL #3  ? Title walk without device with minimal deviation   ? Status Partially Met   ?  ? PT LONG TERM GOAL #4  ? Title report pain decreased 50%   ? Status Partially Met   ?  ? PT LONG TERM GOAL #5  ? Title decrease TUG time to 13 seconds   ? Status Achieved   ? ?  ?  ? ?  ? ? ? ? ? ? ? ? Plan - 06/11/21 1054   ? ? Clinical Impression Statement TUG goal met, progressing with other LTGs. SLS on LLE does increase left hip pain/soreness. some increased redness in medial left calf advised by myslef and primary PT to  take picture and my chart to MD   ? PT Treatment/Interventions ADLs/Self Care Home Management;Cryotherapy;Electrical Stimulation;Iontophoresis 23m/ml Dexamethasone;Moist Heat;Gait training;Stair training;Functional mobility training;Therapeutic activities;Therapeutic exercise;Balance training;Neuromuscular re-education;Manual techniques;Patient/family  education   ? PT Next Visit Plan continue to work on her goals of walking and being able to do stairs and do an exercise class   ? ?  ?  ? ?  ? ? ?Patient will benefit from skilled therapeutic intervention in order to improve the following deficits and impairments:  Difficulty walking, Abnormal gait, Cardiopulmonary status limiting activity, Decreased endurance, Pain, Decreased activity tolerance, Decreased balance, Impaired flexibility, Decreased strength, Decreased mobility ? ?Visit Diagnosis: ?Muscle weakness (generalized) ? ?Pain in left hip ? ?Localized edema ? ?Difficulty in walking, not elsewhere classified ? ?Pain in left ankle and joints of left foot ? ? ? ? ?Problem List ?Patient Active Problem List  ? Diagnosis Date Noted  ? Upper respiratory infection 01/15/2018  ? Postherpetic neuralgia 12/25/2016  ? Dysesthesia 12/25/2016  ? Polyneuropathy 12/25/2016  ? Exposure to influenza 04/01/2016  ? Dysphagia   ? Bronchiectasis without acute exacerbation ([place] 08/30/2015  ? [place]ctasis ([place] 08/14/2015  ? Chronic rhinitis 08/14/2015  ? Avascular necrosis of femur head, left ([place] 04/10/2014  ? OA (osteoarthritis) of hip 04/10/2014  ? Painful orthopaedic hardware ([place] 10/17/2013  ? ? ?Jimenez,Brooke, PTA ?06/11/2021, 10:57 AM ? ?[place] ?[address] ?[place] [address]?Phone: [phone]  Fax:  [phone]? ?Name: CSakina Jimenez?MRN: [id number]?Date of Birth: 2November 20, 1947? ? ? ?

## 2021-06-14 ENCOUNTER — Ambulatory Visit: Payer: Medicare Other | Admitting: Physical Therapy

## 2021-06-14 DIAGNOSIS — R262 Difficulty in walking, not elsewhere classified: Secondary | ICD-10-CM

## 2021-06-14 DIAGNOSIS — M25552 Pain in left hip: Secondary | ICD-10-CM

## 2021-06-14 DIAGNOSIS — M6281 Muscle weakness (generalized): Secondary | ICD-10-CM | POA: Diagnosis not present

## 2021-06-14 DIAGNOSIS — R600 Localized edema: Secondary | ICD-10-CM

## 2021-06-14 NOTE — Therapy (Signed)
[facility] ?[place] [address] ?[place], [place], [address] ?Phone: [phone]   Fax:  [phone] ? ?Physical Therapy Treatment ? ?Patient Details  ?Name: Brooke Jimenez ?MRN: [id number] ?Date of Birth: 14-Nov-1945 ?Referring Provider (PT): Rolena Infante ? ? ?Encounter Date: 06/14/2021 ? ? PT End of Session - 06/14/21 1043   ? ? Visit Number 23   ? Date for PT Re-Evaluation 06/21/21   ? Authorization Type Medicare   ? PT Start Time 1022   ? PT Stop Time 1115   ? PT Time Calculation (min) 53 min   ? ?  ?  ? ?  ? ? ?Past Medical History:  ?Diagnosis Date  ? Acne rosacea   ? Anxiety   ? Arthritis   ? arthritis in hip   ? Bronchiectasis ([place])   ? Cataract   ? beginning  ? Complication of anesthesia   ? "really sore throat"  ? Corneal dystrophy   ? DDD (degenerative disc disease), cervical   ? and lower back  ? Diverticulosis   ? Dysphagia   ? chronic- please see ultrasound done 4/16 15 in EPIC   ? Dysrhythmia   ? sometimes patient has extra beats per patient   ? Esophageal dysmotility   ? Family history of adverse reaction to anesthesia   ? mother slow to wake up   ? GERD (gastroesophageal reflux disease)   ? under control  ? Glaucoma   ? "suspect"  ? Gout   ? one finger x1 attack  ? H/O hiatal hernia   ? Hearing loss   ? bilateral due to nerve loss. wears hearing aides  ? Heart murmur   ? no problem   ? History of chicken pox   ? History of melanoma   ? melanoma- 1986 and 1997   ? Hypothyroidism   ? Low back pain with sciatica   ? Measles   ? hx of  ? Mitral valve prolapse   ? Nocturia   ? Osteoporosis   ? Peripheral neuropathy   ? feet  ? PONV (postoperative nausea and vomiting)   ? PTSD (post-traumatic stress disorder) 2006  ? RSD (reflex sympathetic dystrophy) 1993  ? Spider veins   ? Urinary incontinence   ? occasional  ? Varicose veins   ? ? ?Past Surgical History:  ?Procedure Laterality Date  ? ESOPHAGEAL MANOMETRY N/A 12/17/2015  ? Procedure: ESOPHAGEAL MANOMETRY (EM);   Surgeon: Mauri Pole, MD;  [facility];  Service: Endoscopy;  Laterality: N/A;  ? HARDWARE REMOVAL Left 10/17/2013  ? Procedure: HARDWARE REMOVAL LEFT HIP;  Surgeon: Gearlean Alf, MD;  [facility];  Service: Orthopedics;  Laterality: Left;  ? HIP FRACTURE SURGERY Left 2006  ? 3 screws  ? INSERTION OF MESH  02/08/2014  ? Procedure: INSERTION OF MESH;  Surgeon: Jackolyn Confer, MD;  [facility];  Service: General;;  ? melanoma surgery   1986, 1997  ? PARATHYROIDECTOMY  04/02/15  ? [place] IMPEDANCE STUDY N/A 12/17/2015  ? Procedure: [place] IMPEDANCE STUDY;  Surgeon: Mauri Pole, MD;  [facility];  Service: Endoscopy;  Laterality: N/A;  ? TOTAL HIP ARTHROPLASTY Left 04/10/2014  ? Procedure: LEFT TOTAL HIP ARTHROPLASTY ANTERIOR APPROACH;  Surgeon: Gearlean Alf, MD;  [facility];  Service: Orthopedics;  Laterality: Left;  ? UMBILICAL HERNIA REPAIR N/A 02/08/2014  ? Procedure: HERNIA REPAIR UMBILICAL ADULT WITH MESH;  Surgeon: Jackolyn Confer, MD;  [facility];  Service: General;  Laterality: N/A;  ? ? ?There were no vitals filed for this visit. ? ? Subjective Assessment - 06/14/21 1026   ? ? Subjective saw MD cellulitis vs blood clot, numbers elevated so sending for dopler today   ? Currently in Pain? Yes   ? Pain Score 5    ? Pain Location Ankle   ? Pain Orientation Left   ? ?  ?  ? ?  ? ? ? ? ? ? ? ? ? ? ? ? ? ? ? ? ? ? ? ? [facility] Adult PT Treatment/Exercise - 06/14/21 0001   ? ?  ? Lumbar Exercises: Supine  ? Bridge with [employer] 15 reps   ? Bridge with clamshell 15 reps   ? Other Supine Lumbar Exercises feet on ball K2C, trunk rotation, small bridges, isometrica abs   ?  ? Knee/Hip Exercises: Aerobic  ? Nustep L 5 28mn   ?  ? Knee/Hip Exercises: Machines for Strengthening  ? Cybex Knee Extension 5# 2x10   ? Cybex Knee Flexion 15# 2x10   ?  ? Knee/Hip Exercises: Seated  ? Ball Squeeze 15x   ? Clamshell with TMarga Hoots  ? Marching Strengthening;Both;2 sets;10  reps   green tband  ?  ? Vasopneumatic  ? Number Minutes Vasopneumatic  10 minutes   ? Vasopnuematic Location  Knee   ? Vasopneumatic Pressure Medium   ? Vasopneumatic Temperature  35   ? ?  ?  ? ?  ? ? ? ? ? ? ? ? ? ? ? ? PT Short Term Goals - 03/21/21 1005   ? ?  ? PT SHORT TERM GOAL #1  ? Title independent with HEP   ? Status Achieved   ? ?  ?  ? ?  ? ? ? ? PT Long Term Goals - 06/11/21 1053   ? ?  ? PT LONG TERM GOAL #1  ? Title go up and down stairs step over step consistently   ? Status Partially Met   ?  ? PT LONG TERM GOAL #2  ? Title return to independent gym program   ? Status Partially Met   ?  ? PT LONG TERM GOAL #3  ? Title walk without device with minimal deviation   ? Status Partially Met   ?  ? PT LONG TERM GOAL #4  ? Title report pain decreased 50%   ? Status Partially Met   ?  ? PT LONG TERM GOAL #5  ? Title decrease TUG time to 13 seconds   ? Status Achieved   ? ?  ?  ? ?  ? ? ? ? ? ? ? ? Plan - 06/14/21 1027   ? ? Clinical Impression Statement 7 min late. saw MD and getting dopler this afternoon. focus ex in sitting today vs stang as this increased pain in past .   ? PT Treatment/Interventions ADLs/Self Care Home Management;Cryotherapy;Electrical Stimulation;Iontophoresis 460mml Dexamethasone;Moist Heat;Gait training;Stair training;Functional mobility training;Therapeutic activities;Therapeutic exercise;Balance training;Neuromuscular re-education;Manual techniques;Patient/family education   ? PT Next Visit Plan assess dopler results and progress   ? ?  ?  ? ?  ? ? ?Patient will benefit from skilled therapeutic intervention in order to improve the following deficits and impairments:  Difficulty walking, Abnormal gait, Cardiopulmonary status limiting activity, Decreased endurance, Pain, Decreased activity tolerance, Decreased balance, Impaired flexibility, Decreased strength, Decreased mobility ? ?Visit Diagnosis: ?Muscle weakness (generalized) ? ?Pain in left hip ? ?Localized edema ? ?  Difficulty  in walking, not elsewhere classified ? ? ? ? ?Problem List ?Patient Active Problem List  ? Diagnosis Date Noted  ? Upper respiratory infection 01/15/2018  ? Postherpetic neuralgia 12/25/2016  ? Dysesthesia 12/25/2016  ? Polyneuropathy 12/25/2016  ? Exposure to influenza 04/01/2016  ? Dysphagia   ? Bronchiectasis without acute exacerbation ([place]) 08/30/2015  ? Bronchiectasis ([place]) 08/14/2015  ? Chronic rhinitis 08/14/2015  ? Avascular necrosis of femur head, left ([place]) 04/10/2014  ? OA (osteoarthritis) of hip 04/10/2014  ? Painful orthopaedic hardware ([place]) 10/17/2013  ? ? ?Gretna Bergin,ANGIE, PTA ?06/14/2021, 10:44 AM ? ?[facility] ?[address] ?[place], [place], [address] ?Phone: [phone]   Fax:  [phone] ? ?Name: Brooke Jimenez ?MRN: [id number] ?Date of Birth: 1946/01/28 ? ? ? ?

## 2021-06-19 ENCOUNTER — Encounter: Payer: Self-pay | Admitting: Physical Therapy

## 2021-06-19 ENCOUNTER — Ambulatory Visit: Payer: Medicare Other | Admitting: Physical Therapy

## 2021-06-19 DIAGNOSIS — M6281 Muscle weakness (generalized): Secondary | ICD-10-CM

## 2021-06-19 DIAGNOSIS — R600 Localized edema: Secondary | ICD-10-CM

## 2021-06-19 DIAGNOSIS — M25552 Pain in left hip: Secondary | ICD-10-CM

## 2021-06-19 DIAGNOSIS — R262 Difficulty in walking, not elsewhere classified: Secondary | ICD-10-CM

## 2021-06-19 NOTE — Therapy (Signed)
[facility] ?[place] ?[address] ?[place], [place], [address] ?Phone: [phone]   Fax:  [phone] ? ?Physical Therapy Treatment ? ?Patient Details  ?Name: Brooke Jimenez ?MRN: [id number] ?Date of Birth: 1945/10/06 ?Referring Provider (PT): Rolena Infante ? ? ?Encounter Date: 06/19/2021 ? ? PT End of Session - 06/19/21 1544   ? ? Visit Number 24   ? Date for PT Re-Evaluation 06/21/21   ? Authorization Type Medicare   ? PT Start Time 1324   ? PT Stop Time 1530   ? PT Time Calculation (min) 45 min   ? Activity Tolerance Patient tolerated treatment well;Patient limited by fatigue   ? Behavior During Therapy [facility] for tasks assessed/performed   ? ?  ?  ? ?  ? ? ?Past Medical History:  ?Diagnosis Date  ? Acne rosacea   ? Anxiety   ? Arthritis   ? arthritis in hip   ? Bronchiectasis ([place])   ? Cataract   ? beginning  ? Complication of anesthesia   ? "really sore throat"  ? Corneal dystrophy   ? DDD (degenerative disc disease), cervical   ? and lower back  ? Diverticulosis   ? Dysphagia   ? chronic- please see ultrasound done 4/16 15 in EPIC   ? Dysrhythmia   ? sometimes patient has extra beats per patient   ? Esophageal dysmotility   ? Family history of adverse reaction to anesthesia   ? mother slow to wake up   ? GERD (gastroesophageal reflux disease)   ? under control  ? Glaucoma   ? "suspect"  ? Gout   ? one finger x1 attack  ? H/O hiatal hernia   ? Hearing loss   ? bilateral due to nerve loss. wears hearing aides  ? Heart murmur   ? no problem   ? History of chicken pox   ? History of melanoma   ? melanoma- 1986 and 1997   ? Hypothyroidism   ? Low back pain with sciatica   ? Measles   ? hx of  ? Mitral valve prolapse   ? Nocturia   ? Osteoporosis   ? Peripheral neuropathy   ? feet  ? PONV (postoperative nausea and vomiting)   ? PTSD (post-traumatic stress disorder) 2006  ? RSD (reflex sympathetic dystrophy) 1993  ? Spider veins   ? Urinary incontinence   ? occasional  ? Varicose  veins   ? ? ?Past Surgical History:  ?Procedure Laterality Date  ? ESOPHAGEAL MANOMETRY N/A 12/17/2015  ? Procedure: ESOPHAGEAL MANOMETRY (EM);  Surgeon: Mauri Pole, MD;  [facility];  Service: Endoscopy;  Laterality: N/A;  ? HARDWARE REMOVAL Left 10/17/2013  ? Procedure: HARDWARE REMOVAL LEFT HIP;  Surgeon: Gearlean Alf, MD;  [facility];  Service: Orthopedics;  Laterality: Left;  ? HIP FRACTURE SURGERY Left 2006  ? 3 screws  ? INSERTION OF MESH  02/08/2014  ? Procedure: INSERTION OF MESH;  Surgeon: Jackolyn Confer, MD;  [facility];  Service: General;;  ? melanoma surgery   1986, 1997  ? PARATHYROIDECTOMY  04/02/15  ? [place] IMPEDANCE STUDY N/A 12/17/2015  ? Procedure: [place] IMPEDANCE STUDY;  Surgeon: Mauri Pole, MD;  [facility];  Service: Endoscopy;  Laterality: N/A;  ? TOTAL HIP ARTHROPLASTY Left 04/10/2014  ? Procedure: LEFT TOTAL HIP ARTHROPLASTY ANTERIOR APPROACH;  Surgeon: Gearlean Alf, MD;  [facility];  Service: Orthopedics;  Laterality: Left;  ? UMBILICAL  HERNIA REPAIR N/A 02/08/2014  ? Procedure: HERNIA REPAIR UMBILICAL ADULT WITH MESH;  Surgeon: Jackolyn Confer, MD;  [facility];  Service: General;  Laterality: N/A;  ? ? ?There were no vitals filed for this visit. ? ? Subjective Assessment - 06/19/21 1452   ? ? Subjective Reports that she is feeling better and stronger, she has a little left hip pain, doppler was negative   ? Currently in Pain? Yes   ? Pain Score 3    ? Pain Location Hip   ? Pain Orientation Left   ? Pain Descriptors / Indicators Sore   ? Aggravating Factors  walking   ? ?  ?  ? ?  ? ? ? ? ? ? ? ? ? ? ? ? ? ? ? ? ? ? ? ? [place] Adult PT Treatment/Exercise - 06/19/21 0001   ? ?  ? Ambulation/Gait  ? Gait Comments outside around the building had only one stop due to fatigue, but was more unsteady really requiring a CGA for 75% of the time   ?  ? High Level Balance  ? High Level Balance Comments on airex reaching, cone toe touches, step  over forward and side step overs   ?  ? Lumbar Exercises: Aerobic  ? UBE (Upper Arm Bike) level 2 x 5 minutes   ?  ? Lumbar Exercises: Supine  ? Bridge with [employer] 15 reps   ? Bridge with clamshell 15 reps   ? Other Supine Lumbar Exercises feet on ball K2C, trunk rotation, small bridges, isometrica abs   ?  ? Vasopneumatic  ? Number Minutes Vasopneumatic  10 minutes   ? Vasopnuematic Location  Knee   ? Vasopneumatic Pressure Medium   ? Vasopneumatic Temperature  35   ? ?  ?  ? ?  ? ? ? ? ? ? ? ? ? ? ? ? PT Short Term Goals - 03/21/21 1005   ? ?  ? PT SHORT TERM GOAL #1  ? Title independent with HEP   ? Status Achieved   ? ?  ?  ? ?  ? ? ? ? PT Long Term Goals - 06/11/21 1053   ? ?  ? PT LONG TERM GOAL #1  ? Title go up and down stairs step over step consistently   ? Status Partially Met   ?  ? PT LONG TERM GOAL #2  ? Title return to independent gym program   ? Status Partially Met   ?  ? PT LONG TERM GOAL #3  ? Title walk without device with minimal deviation   ? Status Partially Met   ?  ? PT LONG TERM GOAL #4  ? Title report pain decreased 50%   ? Status Partially Met   ?  ? PT LONG TERM GOAL #5  ? Title decrease TUG time to 13 seconds   ? Status Achieved   ? ?  ?  ? ?  ? ? ? ? ? ? ? ? Plan - 06/19/21 1544   ? ? Clinical Impression Statement Patient still with a very stiff and swollen leg.  She is doing well overall.  able to walk around the buiolding with only 1 rest , was more off balance today, needing CGA as she seemed to veer off often  Fear on the curbs and wanted CGA for this   ? PT Next Visit Plan will continue to work on her functional   ? Consulted and  Agree with Plan of Care Patient   ? ?  ?  ? ?  ? ? ?Patient will benefit from skilled therapeutic intervention in order to improve the following deficits and impairments:  Difficulty walking, Abnormal gait, Cardiopulmonary status limiting activity, Decreased endurance, Pain, Decreased activity tolerance, Decreased balance, Impaired flexibility,  Decreased strength, Decreased mobility ? ?Visit Diagnosis: ?Muscle weakness (generalized) ? ?Pain in left hip ? ?Localized edema ? ?Difficulty in walking, not elsewhere classified ? ? ? ? ?Problem List ?Patient Active Problem List  ? Diagnosis Date Noted  ? Upper respiratory infection 01/15/2018  ? Postherpetic neuralgia 12/25/2016  ? Dysesthesia 12/25/2016  ? Polyneuropathy 12/25/2016  ? Exposure to influenza 04/01/2016  ? Dysphagia   ? Bronchiectasis without acute exacerbation ([place]) 08/30/2015  ? Bronchiectasis ([place]) 08/14/2015  ? Chronic rhinitis 08/14/2015  ? Avascular necrosis of femur head, left ([place]) 04/10/2014  ? OA (osteoarthritis) of hip 04/10/2014  ? Painful orthopaedic hardware [facility]) 10/17/2013  ? ? Sumner Boast, PT ?06/19/2021, 4:10 PM ? ?[place] ?[address] ?[place], [place], [address] ?Phone: [phone]   Fax:  [phone] ? ?Name: Wandy Bossler ?MRN: [id number] ?Date of Birth: 10/25/1945 ? ? ? ?

## 2021-06-21 ENCOUNTER — Ambulatory Visit: Payer: Medicare Other | Admitting: Physical Therapy

## 2021-06-21 DIAGNOSIS — R262 Difficulty in walking, not elsewhere classified: Secondary | ICD-10-CM

## 2021-06-21 DIAGNOSIS — M6281 Muscle weakness (generalized): Secondary | ICD-10-CM | POA: Diagnosis not present

## 2021-06-21 DIAGNOSIS — M25552 Pain in left hip: Secondary | ICD-10-CM

## 2021-06-21 NOTE — Therapy (Signed)
[place] ?[facility] ?[address] ?[place], [place], [address] ?Phone: [phone]   Fax:  [phone] ? ?Physical Therapy Treatment ? ?Patient Details  ?Name: Brooke Jimenez ?MRN: [id number] ?Date of Birth: 04/07/1945 ?Referring Provider (PT): Brooks ? ? ?Encounter Date: 06/21/2021 ? ? PT End of Session - 06/21/21 1050   ? ? Visit Number 25   ? PT Start Time 1015   ? PT Stop Time 1100   ? PT Time Calculation (min) 45 min   ? ?  ?  ? ?  ? ? ?Past Medical History:  ?Diagnosis Date  ? Acne rosacea   ? Anxiety   ? Arthritis   ? arthritis in hip   ? Bronchiectasis (HCC)   ? Cataract   ? beginning  ? Complication of anesthesia   ? "really sore throat"  ? Corneal dystrophy   ? DDD (degenerative disc disease), cervical   ? and lower back  ? Diverticulosis   ? Dysphagia   ? chronic- please see ultrasound done 4/16 15 in EPIC   ? Dysrhythmia   ? sometimes patient has extra beats per patient   ? Esophageal dysmotility   ? Family history of adverse reaction to anesthesia   ? mother slow to wake up   ? GERD (gastroesophageal reflux disease)   ? under control  ? Glaucoma   ? "suspect"  ? Gout   ? one finger x1 attack  ? H/O hiatal hernia   ? Hearing loss   ? bilateral due to nerve loss. wears hearing aides  ? Heart murmur   ? no problem   ? History of chicken pox   ? History of melanoma   ? melanoma- 1986 and 1997   ? Hypothyroidism   ? Low back pain with sciatica   ? Measles   ? hx of  ? Mitral valve prolapse   ? Nocturia   ? Osteoporosis   ? Peripheral neuropathy   ? feet  ? PONV (postoperative nausea and vomiting)   ? PTSD (post-traumatic stress disorder) 2006  ? RSD (reflex sympathetic dystrophy) 1993  ? Spider veins   ? Urinary incontinence   ? occasional  ? Varicose veins   ? ? ?Past Surgical History:  ?Procedure Laterality Date  ? ESOPHAGEAL MANOMETRY N/A 12/17/2015  ? Procedure: ESOPHAGEAL MANOMETRY (EM);  Surgeon: Kavitha V Nandigam, MD;  [facility];  Service:  Endoscopy;  Laterality: N/A;  ? HARDWARE REMOVAL Left 10/17/2013  ? Procedure: HARDWARE REMOVAL LEFT HIP;  Surgeon: Frank Aluisio V, MD;  [facility]: [facility];  Service: Orthopedics;  Laterality: Left;  ? HIP FRACTURE SURGERY Left 2006  ? 3 screws  ? INSERTION OF MESH  02/08/2014  ? Procedure: INSERTION OF MESH;  Surgeon: Todd Rosenbower, MD;  [facility];  Service: General;;  ? melanoma surgery   1986, 1997  ? PARATHYROIDECTOMY  04/02/15  ? [place] IMPEDANCE STUDY N/A 12/17/2015  ? Procedure: PH IMPEDANCE STUDY;  Surgeon: Kavitha V Nandigam, MD;  [facility];  Service: Endoscopy;  Laterality: N/A;  ? TOTAL HIP ARTHROPLASTY Left 04/10/2014  ? Procedure: LEFT TOTAL HIP ARTHROPLASTY ANTERIOR APPROACH;  Surgeon: Frank Aluisio V, MD;  [facility];  Service: Orthopedics;  Laterality: Left;  ? UMBILICAL HERNIA REPAIR N/A 02/08/2014  ? Procedure: HERNIA REPAIR UMBILICAL ADULT WITH MESH;  Surgeon: Todd Rosenbower, MD;  [facility];  Service: General;  Laterality: N/A;  ? ? ?There were no vitals filed   for this visit. ? ? Subjective Assessment - 06/21/21 1025   ? ? Subjective sore after last session and zoom ex after too.   ? Currently in Pain? Yes   ? Pain Score 5    ? Pain Location Hip   ? Pain Orientation Left   ? ?  ?  ? ?  ? ? ? ? ? ? ? ? ? ? ? ? ? ? ? ? ? ? ? ? [facility] Adult PT Treatment/Exercise - 06/21/21 0001   ? ?  ? Ambulation/Gait  ? Gait Comments outside around the building had 3 stops due to fatigue and SOB, but was more unsteady really requiring a CGA for 75% of the time, cuing to control speed on decline. needed to hold PTA shld with curb- fearful. 9 min total time   ?  ? Lumbar Exercises: Standing  ? Other Standing Lumbar Exercises sports cord 20 # 5 x fwd, 3 x each side   ?  ? Knee/Hip Exercises: Standing  ? Lateral Step Up Both;10 reps   min A  ? Forward Step Up Both;5 reps   onto airex min A  ? ?  ?  ? ?  ? ? ? ? ? ? ? ? ? ? ? ? PT Short Term Goals - 03/21/21 1005   ? ?  ? PT SHORT TERM GOAL #1   ? Title independent with HEP   ? Status Achieved   ? ?  ?  ? ?  ? ? ? ? PT Long Term Goals - 06/21/21 1034   ? ?  ? PT LONG TERM GOAL #1  ? Title go up and down stairs step over step consistently   ? Status Partially Met   ?  ? PT LONG TERM GOAL #2  ? Title return to independent gym program   ? Status Partially Met   ?  ? PT LONG TERM GOAL #3  ? Title walk without device with minimal deviation   ? Status Partially Met   ?  ? PT LONG TERM GOAL #4  ? Title report pain decreased 50%   ? Status Partially Met   ? ?  ?  ? ?  ? ? ? ? ? ? ? ? Plan - 06/21/21 1047   ? ? Clinical Impression Statement amb around building 9 min with CG A with unsteadiness and cuing to decrease speed on decline, 3 standing rest breaks needed. workied n resisted gait and step ups on .dynamic surfaces- min A and cuing needed. some increased hip pain but doing PT and zoom ex on same day, up to riding bike 28 min . discussed with pt not doing PT , zoom and bike all in one day- mix it up to help with soreness and she VUprogressing slowly towards goals. VASO in use so did not do today   ? PT Treatment/Interventions ADLs/Self Care Home Management;Cryotherapy;Electrical Stimulation;Iontophoresis 4mg/ml Dexamethasone;Moist Heat;Gait training;Stair training;Functional mobility training;Therapeutic activities;Therapeutic exercise;Balance training;Neuromuscular re-education;Manual techniques;Patient/family education   ? PT Next Visit Plan renewal done   ? ?  ?  ? ?  ? ? ?Patient will benefit from skilled therapeutic intervention in order to improve the following deficits and impairments:  Difficulty walking, Abnormal gait, Cardiopulmonary status limiting activity, Decreased endurance, Pain, Decreased activity tolerance, Decreased balance, Impaired flexibility, Decreased strength, Decreased mobility ? ?Visit Diagnosis: ?Muscle weakness (generalized) ? ?Pain in left hip ? ?Difficulty in walking, not elsewhere classified ? ? ? ? ?Problem List ?Patient   Active  Problem List  ? Diagnosis Date Noted  ? Upper respiratory infection 01/15/2018  ? Postherpetic neuralgia 12/25/2016  ? Dysesthesia 12/25/2016  ? Polyneuropathy 12/25/2016  ? Exposure to influenza 04/01/2016  ? Dysphagia   ? Bronchiectasis without acute exacerbation (HCC) 08/30/2015  ? Bronchiectasis (HCC) 08/14/2015  ? Chronic rhinitis 08/14/2015  ? Avascular necrosis of femur head, left (HCC) 04/10/2014  ? OA (osteoarthritis) of hip 04/10/2014  ? Painful orthopaedic hardware (HCC) 10/17/2013  ? ? ?PAYSEUR,ANGIE, PTA ?06/21/2021, 10:54 AM ? ?[place] ?[facility] ?[address] ?[place], [place], [address] ?Phone: [phone]   Fax:  [phone] ? ?Name: Brooke Jimenez ?MRN: [id number] ?Date of Birth: 11/21/1945 ? ? ? ?

## 2021-06-24 NOTE — Addendum Note (Signed)
Addended by: Sumner Boast on: 06/24/2021 07:27 AM ? ? Modules accepted: Orders ? ?

## 2021-06-25 ENCOUNTER — Ambulatory Visit: Payer: Medicare Other | Attending: Orthopedic Surgery | Admitting: Physical Therapy

## 2021-06-25 ENCOUNTER — Encounter: Payer: Self-pay | Admitting: Physical Therapy

## 2021-06-25 DIAGNOSIS — M25572 Pain in left ankle and joints of left foot: Secondary | ICD-10-CM | POA: Diagnosis present

## 2021-06-25 DIAGNOSIS — G8929 Other chronic pain: Secondary | ICD-10-CM | POA: Diagnosis present

## 2021-06-25 DIAGNOSIS — M545 Low back pain, unspecified: Secondary | ICD-10-CM | POA: Insufficient documentation

## 2021-06-25 DIAGNOSIS — R262 Difficulty in walking, not elsewhere classified: Secondary | ICD-10-CM | POA: Diagnosis present

## 2021-06-25 DIAGNOSIS — M6281 Muscle weakness (generalized): Secondary | ICD-10-CM | POA: Diagnosis present

## 2021-06-25 DIAGNOSIS — M25552 Pain in left hip: Secondary | ICD-10-CM | POA: Diagnosis present

## 2021-06-25 DIAGNOSIS — R600 Localized edema: Secondary | ICD-10-CM | POA: Diagnosis present

## 2021-06-25 NOTE — Therapy (Signed)
[place] ?[place] [address] ?[place], [place], [address] ?Phone: [phone]   Fax:  [phone] ? ?Physical Therapy Treatment ? ?Patient Details  ?Name: Brooke Jimenez ?MRN: [id number] ?Date of Birth: December 04, 1945 ?Referring Provider (PT): Rolena Infante ? ? ?Encounter Date: 06/25/2021 ? ? PT End of Session - 06/25/21 1150   ? ? Visit Number 26   ? Date for PT Re-Evaluation 07/21/21   ? Authorization Type Medicare   ? PT Start Time 1108   ? PT Stop Time 1205   ? PT Time Calculation (min) 57 min   ? Activity Tolerance Patient tolerated treatment well;Patient limited by fatigue   ? Behavior During Therapy [facility] for tasks assessed/performed   ? ?  ?  ? ?  ? ? ?Past Medical History:  ?Diagnosis Date  ? Acne rosacea   ? Anxiety   ? Arthritis   ? arthritis in hip   ? Bronchiectasis ([place])   ? Cataract   ? beginning  ? Complication of anesthesia   ? "really sore throat"  ? Corneal dystrophy   ? DDD (degenerative disc disease), cervical   ? and lower back  ? Diverticulosis   ? Dysphagia   ? chronic- please see ultrasound done 4/16 15 in EPIC   ? Dysrhythmia   ? sometimes patient has extra beats per patient   ? Esophageal dysmotility   ? Family history of adverse reaction to anesthesia   ? mother slow to wake up   ? GERD (gastroesophageal reflux disease)   ? under control  ? Glaucoma   ? "suspect"  ? Gout   ? one finger x1 attack  ? H/O hiatal hernia   ? Hearing loss   ? bilateral due to nerve loss. wears hearing aides  ? Heart murmur   ? no problem   ? History of chicken pox   ? History of melanoma   ? melanoma- 1986 and 1997   ? Hypothyroidism   ? Low back pain with sciatica   ? Measles   ? hx of  ? Mitral valve prolapse   ? Nocturia   ? Osteoporosis   ? Peripheral neuropathy   ? feet  ? PONV (postoperative nausea and vomiting)   ? PTSD (post-traumatic stress disorder) 2006  ? RSD (reflex sympathetic dystrophy) 1993  ? Spider veins   ? Urinary incontinence   ? occasional  ? Varicose  veins   ? ? ?Past Surgical History:  ?Procedure Laterality Date  ? ESOPHAGEAL MANOMETRY N/A 12/17/2015  ? Procedure: ESOPHAGEAL MANOMETRY (EM);  Surgeon: Mauri Pole, MD;  [facility];  Service: Endoscopy;  Laterality: N/A;  ? HARDWARE REMOVAL Left 10/17/2013  ? Procedure: HARDWARE REMOVAL LEFT HIP;  Surgeon: Gearlean Alf, MD;  [facility];  Service: Orthopedics;  Laterality: Left;  ? HIP FRACTURE SURGERY Left 2006  ? 3 screws  ? INSERTION OF MESH  02/08/2014  ? Procedure: INSERTION OF MESH;  Surgeon: Jackolyn Confer, MD;  [facility];  Service: General;;  ? melanoma surgery   1986, 1997  ? PARATHYROIDECTOMY  04/02/15  ? [place] IMPEDANCE STUDY N/A 12/17/2015  ? Procedure: [place] IMPEDANCE STUDY;  Surgeon: Mauri Pole, MD;  [facility];  Service: Endoscopy;  Laterality: N/A;  ? TOTAL HIP ARTHROPLASTY Left 04/10/2014  ? Procedure: LEFT TOTAL HIP ARTHROPLASTY ANTERIOR APPROACH;  Surgeon: Gearlean Alf, MD;  [facility];  Service: Orthopedics;  Laterality: Left;  ? UMBILICAL  HERNIA REPAIR N/A 02/08/2014  ? Procedure: HERNIA REPAIR UMBILICAL ADULT WITH MESH;  Surgeon: Jackolyn Confer, MD;  [facility];  Service: General;  Laterality: N/A;  ? ? ?There were no vitals filed for this visit. ? ? Subjective Assessment - 06/25/21 1118   ? ? Subjective Patient is frustrated she reports that she has seen different MD's and gets different opinions as well as treatment options, she is sollen and red in the left shin area   ? Currently in Pain? Yes   ? Pain Score 3    ? Pain Location Ankle   ? Pain Orientation Left   ? Pain Descriptors / Indicators Sore   ? ?  ?  ? ?  ? ? ? ? ? [facility] PT Assessment - 06/25/21 0001   ? ?  ? Circumferential Edema  ? Circumferential - Right 5" below the inferior patella in sitting  42 cm   ? Circumferential - Left  44.5 cm   ? ?  ?  ? ?  ? ? ? ? ? ? ? ? ? ? ? ? ? ? ? ? [facility] Adult PT Treatment/Exercise - 06/25/21 0001   ? ?  ? Vasopneumatic  ? Number  Minutes Vasopneumatic  20 minutes   ? Vasopnuematic Location  Knee;Ankle   ? Vasopneumatic Pressure Medium   ? Vasopneumatic Temperature  35   ?  ? Manual Therapy  ? Manual Therapy Edema management;Passive ROM   ? Edema Management very light edema management mobilization of the ankle/ foot and calf   ? Passive ROM ankle, toes   ? ?  ?  ? ?  ? ? ? ? ? ? ? ? ? ? ? ? PT Short Term Goals - 03/21/21 1005   ? ?  ? PT SHORT TERM GOAL #1  ? Title independent with HEP   ? Status Achieved   ? ?  ?  ? ?  ? ? ? ? PT Long Term Goals - 06/25/21 1154   ? ?  ? PT LONG TERM GOAL #1  ? Title go up and down stairs step over step consistently   ? Status Partially Met   ?  ? PT LONG TERM GOAL #2  ? Title return to independent gym program   ? Status Partially Met   ?  ? PT LONG TERM GOAL #3  ? Title walk without device with minimal deviation   ? Status Partially Met   ? ?  ?  ? ?  ? ? ? ? ? ? ? ? Plan - 06/25/21 1150   ? ? Clinical Impression Statement Patient with concerns about the hematoma, I went ahead and measured so we could track the swelling, I feel like overall it is smaller, she has better motions and is walking better, she does have redness along the left shin and is tender here and along the hematoma area, the discoloration has faded.  I added edema massage to see if this would help   ? PT Next Visit Plan see how the STM did for her   ? Consulted and Agree with Plan of Care Patient   ? ?  ?  ? ?  ? ? ?Patient will benefit from skilled therapeutic intervention in order to improve the following deficits and impairments:  Difficulty walking, Abnormal gait, Cardiopulmonary status limiting activity, Decreased endurance, Pain, Decreased activity tolerance, Decreased balance, Impaired flexibility, Decreased strength, Decreased mobility ? ?Visit Diagnosis: ?Muscle weakness (generalized) ? ?  Pain in left hip ? ?Difficulty in walking, not elsewhere classified ? ?Localized edema ? ?Pain in left ankle and joints of left  foot ? ? ? ? ?Problem List ?Patient Active Problem List  ? Diagnosis Date Noted  ? Upper respiratory infection 01/15/2018  ? Postherpetic neuralgia 12/25/2016  ? Dysesthesia 12/25/2016  ? Polyneuropathy 12/25/2016  ? Exposure to influenza 04/01/2016  ? Dysphagia   ? Bronchiectasis without acute exacerbation ([place]) 08/30/2015  ? Bronchiectasis ([place]) 08/14/2015  ? Chronic rhinitis 08/14/2015  ? Avascular necrosis of femur head, left ([place]) 04/10/2014  ? OA (osteoarthritis) of hip 04/10/2014  ? Painful orthopaedic hardware [facility]) 10/17/2013  ? ? Sumner Boast, PT ?06/25/2021, 11:55 AM ? ?[place] ?[place] ?[address] ?[place], [place], [address] ?Phone: [phone]   Fax:  [phone] ? ?Name: Brooke Jimenez ?MRN: [id number] ?Date of Birth: 1945-12-29 ? ? ? ?

## 2021-06-27 ENCOUNTER — Ambulatory Visit: Payer: Medicare Other | Admitting: Physical Therapy

## 2021-07-02 ENCOUNTER — Ambulatory Visit: Payer: Medicare Other | Admitting: Physical Therapy

## 2021-07-02 ENCOUNTER — Encounter: Payer: Self-pay | Admitting: Physical Therapy

## 2021-07-02 DIAGNOSIS — M25552 Pain in left hip: Secondary | ICD-10-CM

## 2021-07-02 DIAGNOSIS — M6281 Muscle weakness (generalized): Secondary | ICD-10-CM

## 2021-07-02 DIAGNOSIS — R262 Difficulty in walking, not elsewhere classified: Secondary | ICD-10-CM

## 2021-07-02 DIAGNOSIS — G8929 Other chronic pain: Secondary | ICD-10-CM

## 2021-07-02 DIAGNOSIS — R600 Localized edema: Secondary | ICD-10-CM

## 2021-07-02 DIAGNOSIS — M25572 Pain in left ankle and joints of left foot: Secondary | ICD-10-CM

## 2021-07-02 DIAGNOSIS — M545 Low back pain, unspecified: Secondary | ICD-10-CM

## 2021-07-02 NOTE — Therapy (Signed)
[place] ?[place] ?[address] ?[place], [place], [address] ?Phone: [phone]   Fax:  [phone] ? ?Physical Therapy Treatment ? ?Patient Details  ?Name: Brooke Jimenez ?MRN: [id number] ?Date of Birth: Aug 14, 1945 ?Referring Provider (PT): Rolena Infante ? ? ?Encounter Date: 07/02/2021 ? ? PT End of Session - 07/02/21 1525   ? ? Visit Number 27   ? Date for PT Re-Evaluation 07/21/21   ? Authorization Type Medicare   ? PT Start Time 6789   ? PT Stop Time 1543   ? PT Time Calculation (min) 58 min   ? Activity Tolerance Patient tolerated treatment well;Patient limited by fatigue   ? Behavior During Therapy [facility] for tasks assessed/performed   ? ?  ?  ? ?  ? ? ?Past Medical History:  ?Diagnosis Date  ? Acne rosacea   ? Anxiety   ? Arthritis   ? arthritis in hip   ? Bronchiectasis ([place])   ? Cataract   ? beginning  ? Complication of anesthesia   ? "really sore throat"  ? Corneal dystrophy   ? DDD (degenerative disc disease), cervical   ? and lower back  ? Diverticulosis   ? Dysphagia   ? chronic- please see ultrasound done 4/16 15 in EPIC   ? Dysrhythmia   ? sometimes patient has extra beats per patient   ? Esophageal dysmotility   ? Family history of adverse reaction to anesthesia   ? mother slow to wake up   ? GERD (gastroesophageal reflux disease)   ? under control  ? Glaucoma   ? "suspect"  ? Gout   ? one finger x1 attack  ? H/O hiatal hernia   ? Hearing loss   ? bilateral due to nerve loss. wears hearing aides  ? Heart murmur   ? no problem   ? History of chicken pox   ? History of melanoma   ? melanoma- 1986 and 1997   ? Hypothyroidism   ? Low back pain with sciatica   ? Measles   ? hx of  ? Mitral valve prolapse   ? Nocturia   ? Osteoporosis   ? Peripheral neuropathy   ? feet  ? PONV (postoperative nausea and vomiting)   ? PTSD (post-traumatic stress disorder) 2006  ? RSD (reflex sympathetic dystrophy) 1993  ? Spider veins   ? Urinary incontinence   ? occasional  ? Varicose  veins   ? ? ?Past Surgical History:  ?Procedure Laterality Date  ? ESOPHAGEAL MANOMETRY N/A 12/17/2015  ? Procedure: ESOPHAGEAL MANOMETRY (EM);  Surgeon: Mauri Pole, MD;  [facility];  Service: Endoscopy;  Laterality: N/A;  ? HARDWARE REMOVAL Left 10/17/2013  ? Procedure: HARDWARE REMOVAL LEFT HIP;  Surgeon: Gearlean Alf, MD;  [facility];  Service: Orthopedics;  Laterality: Left;  ? HIP FRACTURE SURGERY Left 2006  ? 3 screws  ? INSERTION OF MESH  02/08/2014  ? Procedure: INSERTION OF MESH;  Surgeon: Jackolyn Confer, MD;  [facility];  Service: General;;  ? melanoma surgery   1986, 1997  ? PARATHYROIDECTOMY  04/02/15  ? [place] IMPEDANCE STUDY N/A 12/17/2015  ? Procedure: [place] IMPEDANCE STUDY;  Surgeon: Mauri Pole, MD;  [facility];  Service: Endoscopy;  Laterality: N/A;  ? TOTAL HIP ARTHROPLASTY Left 04/10/2014  ? Procedure: LEFT TOTAL HIP ARTHROPLASTY ANTERIOR APPROACH;  Surgeon: Gearlean Alf, MD;  [facility];  Service: Orthopedics;  Laterality: Left;  ? UMBILICAL  HERNIA REPAIR N/A 02/08/2014  ? Procedure: HERNIA REPAIR UMBILICAL ADULT WITH MESH;  Surgeon: Jackolyn Confer, MD;  [facility];  Service: General;  Laterality: N/A;  ? ? ?There were no vitals filed for this visit. ? ? Subjective Assessment - 07/02/21 1449   ? ? Subjective Patient hit her toe last week and tore off her toe nail, i feared for this causing an increase in the cellulitis, , she has a ild limp   ? Currently in Pain? Yes   ? Pain Score 3    ? Pain Location Ankle   left 4th toe  ? Pain Descriptors / Indicators Sore;Aching   ? Aggravating Factors  hitting toe   ? ?  ?  ? ?  ? ? ? ? ? ? ? ? ? ? ? ? ? ? ? ? ? ? ? ? [place] Adult PT Treatment/Exercise - 07/02/21 0001   ? ?  ? Ambulation/Gait  ? Gait Comments walk outside around the parking island needed HHA , needed two rest breaks   ?  ? Lumbar Exercises: Supine  ? Other Supine Lumbar Exercises feet on ball K2C, trunk rotation, small bridges,  isometrica abs   ?  ? Knee/Hip Exercises: Aerobic  ? Nustep L 5 70mn   ?  ? Knee/Hip Exercises: Machines for Strengthening  ? Cybex Knee Flexion 15# 2x10   ? Cybex Leg Press 20# 2x10   ? Other Machine 20# lats and rows   ?  ? Knee/Hip Exercises: Supine  ? Short Arc QTarget CorporationBoth;2 sets;10 reps   ? Short Arc QTarget CorporationLimitations 3#   ? Bridges with BCardinal Health10 reps   ? Bridges with Clamshell 10 reps   ?  ? Vasopneumatic  ? Number Minutes Vasopneumatic  15 minutes   ? Vasopnuematic Location  Knee;Ankle   ? Vasopneumatic Pressure Medium   ? Vasopneumatic Temperature  34   ? ?  ?  ? ?  ? ? ? ? ? ? ? ? ? ? ? ? PT Short Term Goals - 03/21/21 1005   ? ?  ? PT SHORT TERM GOAL #1  ? Title independent with HEP   ? Status Achieved   ? ?  ?  ? ?  ? ? ? ? PT Long Term Goals - 07/02/21 1528   ? ?  ? PT LONG TERM GOAL #1  ? Title go up and down stairs step over step consistently   ? Status Partially Met   ?  ? PT LONG TERM GOAL #2  ? Title return to independent gym program   ? Status Partially Met   ? ?  ?  ? ?  ? ? ? ? ? ? ? ? Plan - 07/02/21 1526   ? ? Clinical Impression Statement Patient hit her 4th toe on the left leg last week, she had to have the toenail removed, I worried about a flare up of the cellulitis, the leg actually looks better than last week, less pink/red coloration.  She has a limp but it is not bad, walking outside she required CGA/HHA for the whole time as she did not feel stable  I tried to continue to progress some exercises   ? PT Next Visit Plan continue to work on her function, breathing is an issue at times   ? Consulted and Agree with Plan of Care Patient   ? ?  ?  ? ?  ? ? ?Patient will  benefit from skilled therapeutic intervention in order to improve the following deficits and impairments:  Difficulty walking, Abnormal gait, Cardiopulmonary status limiting activity, Decreased endurance, Pain, Decreased activity tolerance, Decreased balance, Impaired flexibility, Decreased strength, Decreased  mobility ? ?Visit Diagnosis: ?Muscle weakness (generalized) ? ?Pain in left hip ? ?Difficulty in walking, not elsewhere classified ? ?Localized edema ? ?Pain in left ankle and joints of left foot ? ?Chronic bilateral low back pain without sciatica ? ? ? ? ?Problem List ?Patient Active Problem List  ? Diagnosis Date Noted  ? Upper respiratory infection 01/15/2018  ? Postherpetic neuralgia 12/25/2016  ? Dysesthesia 12/25/2016  ? Polyneuropathy 12/25/2016  ? Exposure to influenza 04/01/2016  ? Dysphagia   ? Bronchiectasis without acute exacerbation ([place]) 08/30/2015  ? Bronchiectasis ([place]) 08/14/2015  ? Chronic rhinitis 08/14/2015  ? Avascular necrosis of femur head, left ([place]) 04/10/2014  ? OA (osteoarthritis) of hip 04/10/2014  ? Painful orthopaedic hardware [facility]) 10/17/2013  ? ? Sumner Boast, PT ?07/02/2021, 3:29 PM ? ? ?[place] ?[address] ?[place], [place], [address] ?Phone: [phone]   Fax:  [phone] ? ?Name: Anay Walter ?MRN: [id number] ?Date of Birth: 02/03/1946 ? ? ? ?

## 2021-07-04 ENCOUNTER — Ambulatory Visit: Payer: Medicare Other | Admitting: Physical Therapy

## 2021-07-04 ENCOUNTER — Encounter: Payer: Self-pay | Admitting: Physical Therapy

## 2021-07-04 DIAGNOSIS — R262 Difficulty in walking, not elsewhere classified: Secondary | ICD-10-CM

## 2021-07-04 DIAGNOSIS — M6281 Muscle weakness (generalized): Secondary | ICD-10-CM

## 2021-07-04 DIAGNOSIS — R600 Localized edema: Secondary | ICD-10-CM

## 2021-07-04 DIAGNOSIS — G8929 Other chronic pain: Secondary | ICD-10-CM

## 2021-07-04 DIAGNOSIS — M25552 Pain in left hip: Secondary | ICD-10-CM

## 2021-07-04 DIAGNOSIS — M25572 Pain in left ankle and joints of left foot: Secondary | ICD-10-CM

## 2021-07-04 NOTE — Therapy (Signed)
[place] ?[place] ?[address] ?[place], [place], [address] ?Phone: [phone]   Fax:  [phone] ? ?Physical Therapy Treatment ? ?Patient Details  ?Name: Brooke Jimenez ?MRN: [id number] ?Date of Birth: 01-28-1946 ?Referring Provider (PT): Rolena Infante ? ? ?Encounter Date: 07/04/2021 ? ? PT End of Session - 07/04/21 1304   ? ? Visit Number 28   ? Date for PT Re-Evaluation 07/21/21   ? Authorization Type Medicare   ? PT Start Time 0930   ? PT Stop Time 1020   ? PT Time Calculation (min) 50 min   ? Activity Tolerance Patient tolerated treatment well   ? Behavior During Therapy [facility] for tasks assessed/performed   ? ?  ?  ? ?  ? ? ?Past Medical History:  ?Diagnosis Date  ? Acne rosacea   ? Anxiety   ? Arthritis   ? arthritis in hip   ? Bronchiectasis ([place])   ? Cataract   ? beginning  ? Complication of anesthesia   ? "really sore throat"  ? Corneal dystrophy   ? DDD (degenerative disc disease), cervical   ? and lower back  ? Diverticulosis   ? Dysphagia   ? chronic- please see ultrasound done 4/16 15 in EPIC   ? Dysrhythmia   ? sometimes patient has extra beats per patient   ? Esophageal dysmotility   ? Family history of adverse reaction to anesthesia   ? mother slow to wake up   ? GERD (gastroesophageal reflux disease)   ? under control  ? Glaucoma   ? "suspect"  ? Gout   ? one finger x1 attack  ? H/O hiatal hernia   ? Hearing loss   ? bilateral due to nerve loss. wears hearing aides  ? Heart murmur   ? no problem   ? History of chicken pox   ? History of melanoma   ? melanoma- 1986 and 1997   ? Hypothyroidism   ? Low back pain with sciatica   ? Measles   ? hx of  ? Mitral valve prolapse   ? Nocturia   ? Osteoporosis   ? Peripheral neuropathy   ? feet  ? PONV (postoperative nausea and vomiting)   ? PTSD (post-traumatic stress disorder) 2006  ? RSD (reflex sympathetic dystrophy) 1993  ? Spider veins   ? Urinary incontinence   ? occasional  ? Varicose veins   ? ? ?Past Surgical  History:  ?Procedure Laterality Date  ? ESOPHAGEAL MANOMETRY N/A 12/17/2015  ? Procedure: ESOPHAGEAL MANOMETRY (EM);  Surgeon: Mauri Pole, MD;  [facility];  Service: Endoscopy;  Laterality: N/A;  ? HARDWARE REMOVAL Left 10/17/2013  ? Procedure: HARDWARE REMOVAL LEFT HIP;  Surgeon: Gearlean Alf, MD;  [facility];  Service: Orthopedics;  Laterality: Left;  ? HIP FRACTURE SURGERY Left 2006  ? 3 screws  ? INSERTION OF MESH  02/08/2014  ? Procedure: INSERTION OF MESH;  Surgeon: Jackolyn Confer, MD;  [facility];  Service: General;;  ? melanoma surgery   1986, 1997  ? PARATHYROIDECTOMY  04/02/15  ? [place] IMPEDANCE STUDY N/A 12/17/2015  ? Procedure: [place] IMPEDANCE STUDY;  Surgeon: Mauri Pole, MD;  [facility];  Service: Endoscopy;  Laterality: N/A;  ? TOTAL HIP ARTHROPLASTY Left 04/10/2014  ? Procedure: LEFT TOTAL HIP ARTHROPLASTY ANTERIOR APPROACH;  Surgeon: Gearlean Alf, MD;  [facility];  Service: Orthopedics;  Laterality: Left;  ? UMBILICAL HERNIA REPAIR N/A  02/08/2014  ? Procedure: HERNIA REPAIR UMBILICAL ADULT WITH MESH;  Surgeon: Jackolyn Confer, MD;  [facility];  Service: General;  Laterality: N/A;  ? ? ?There were no vitals filed for this visit. ? ? Subjective Assessment - 07/04/21 0934   ? ? Subjective Feet hurt, I have to wear these shoes and they are not the best support, but I have room for the toe   ? Currently in Pain? Yes   ? Pain Score 3    ? Pain Location Ankle   ? Pain Orientation Left   ? Pain Descriptors / Indicators Tightness;Sore   ? Aggravating Factors  wearing these shoes   ? ?  ?  ? ?  ? ? ? ? ? ? ? ? ? ? ? ? ? ? ? ? ? ? ? ? [place] Adult PT Treatment/Exercise - 07/04/21 0001   ? ?  ? Lumbar Exercises: Aerobic  ? Stationary Bike level 2 x 6 minutes   ? Recumbent Bike level 2 x 6 minutes   ?  ? Lumbar Exercises: Machines for Strengthening  ? Leg Press 20# 2x10   ? Other Lumbar Machine Exercise seated row 15# 2x10, lats 20# 2x10   ? Other Lumbar  Machine Exercise 5# straight arm pulls   ?  ? Knee/Hip Exercises: Supine  ? Bridges with [employer] 10 reps   ? Bridges with Clamshell 10 reps   ? Other Supine Knee/Hip Exercises ankle pumps and circles in elevation   ?  ? Vasopneumatic  ? Number Minutes Vasopneumatic  15 minutes   ? Vasopnuematic Location  Knee;Ankle   ? Vasopneumatic Pressure Medium   ? Vasopneumatic Temperature  34   ? ?  ?  ? ?  ? ? ? ? ? ? ? ? ? ? ? ? PT Short Term Goals - 03/21/21 1005   ? ?  ? PT SHORT TERM GOAL #1  ? Title independent with HEP   ? Status Achieved   ? ?  ?  ? ?  ? ? ? ? PT Long Term Goals - 07/04/21 1307   ? ?  ? PT LONG TERM GOAL #1  ? Title go up and down stairs step over step consistently   ? Status Partially Met   ? ?  ?  ? ?  ? ? ? ? ? ? ? ? Plan - 07/04/21 1305   ? ? Clinical Impression Statement Walking better, still swelling int he left LE, less pink discoloration.  Struggles some with breathing with exertion, has some pain in the back and hips with the activity, no issues with [place] trying to go back to exercises   ? PT Next Visit Plan continue to work on her function, breathing is an issue at times   ? Consulted and Agree with Plan of Care Patient   ? ?  ?  ? ?  ? ? ?Patient will benefit from skilled therapeutic intervention in order to improve the following deficits and impairments:  Difficulty walking, Abnormal gait, Cardiopulmonary status limiting activity, Decreased endurance, Pain, Decreased activity tolerance, Decreased balance, Impaired flexibility, Decreased strength, Decreased mobility ? ?Visit Diagnosis: ?Muscle weakness (generalized) ? ?Pain in left hip ? ?Difficulty in walking, not elsewhere classified ? ?Localized edema ? ?Pain in left ankle and joints of left foot ? ?Chronic bilateral low back pain without sciatica ? ? ? ? ?Problem List ?Patient Active Problem List  ? Diagnosis Date Noted  ? Upper respiratory infection  01/15/2018  ? Postherpetic neuralgia 12/25/2016  ? Dysesthesia 12/25/2016  ?  Polyneuropathy 12/25/2016  ? Exposure to influenza 04/01/2016  ? Dysphagia   ? Bronchiectasis without acute exacerbation ([place]) 08/30/2015  ? Bronchiectasis ([place]) 08/14/2015  ? Chronic rhinitis 08/14/2015  ? Avascular necrosis of femur head, left ([place]) 04/10/2014  ? OA (osteoarthritis) of hip 04/10/2014  ? Painful orthopaedic hardware [facility]) 10/17/2013  ? ? Sumner Boast, PT ?07/04/2021, 1:07 PM ? ?[place] ?[address] ?[place], [place], [address] ?Phone: [phone]   Fax:  [phone] ? ?Name: Brooke Jimenez ?MRN: [id number] ?Date of Birth: [birth year] ? ? ? ?

## 2021-07-09 ENCOUNTER — Ambulatory Visit: Payer: Medicare Other | Admitting: Physical Therapy

## 2021-07-09 DIAGNOSIS — M6281 Muscle weakness (generalized): Secondary | ICD-10-CM

## 2021-07-09 DIAGNOSIS — R262 Difficulty in walking, not elsewhere classified: Secondary | ICD-10-CM

## 2021-07-09 DIAGNOSIS — R600 Localized edema: Secondary | ICD-10-CM

## 2021-07-09 DIAGNOSIS — M25552 Pain in left hip: Secondary | ICD-10-CM

## 2021-07-09 NOTE — Therapy (Signed)
[place] ?[place] ?[address] ?[place], [place], [address] ?Phone: [phone]   Fax:  [phone] ? ?Physical Therapy Treatment ? ?Patient Details  ?Name: Brooke Jimenez ?MRN: [id number] ?Date of Birth: [birth year] ?Referring Provider (PT): Rolena Infante ? ? ?Encounter Date: 07/09/2021 ? ? PT End of Session - 07/09/21 1258   ? ? Visit Number 29   ? Date for PT Re-Evaluation 07/21/21   ? Authorization Type Medicare   ? PT Start Time 1225   ? PT Stop Time 1315   ? PT Time Calculation (min) 50 min   ? ?  ?  ? ?  ? ? ?Past Medical History:  ?Diagnosis Date  ? Acne rosacea   ? Anxiety   ? Arthritis   ? arthritis in hip   ? Bronchiectasis ([place])   ? Cataract   ? beginning  ? Complication of anesthesia   ? "really sore throat"  ? Corneal dystrophy   ? DDD (degenerative disc disease), cervical   ? and lower back  ? Diverticulosis   ? Dysphagia   ? chronic- please see ultrasound done 4/16 15 in EPIC   ? Dysrhythmia   ? sometimes patient has extra beats per patient   ? Esophageal dysmotility   ? Family history of adverse reaction to anesthesia   ? mother slow to wake up   ? GERD (gastroesophageal reflux disease)   ? under control  ? Glaucoma   ? "suspect"  ? Gout   ? one finger x1 attack  ? H/O hiatal hernia   ? Hearing loss   ? bilateral due to nerve loss. wears hearing aides  ? Heart murmur   ? no problem   ? History of chicken pox   ? History of melanoma   ? melanoma- 1986 and 1997   ? Hypothyroidism   ? Low back pain with sciatica   ? Measles   ? hx of  ? Mitral valve prolapse   ? Nocturia   ? Osteoporosis   ? Peripheral neuropathy   ? feet  ? PONV (postoperative nausea and vomiting)   ? PTSD (post-traumatic stress disorder) 2006  ? RSD (reflex sympathetic dystrophy) 1993  ? Spider veins   ? Urinary incontinence   ? occasional  ? Varicose veins   ? ? ?Past Surgical History:  ?Procedure Laterality Date  ? ESOPHAGEAL MANOMETRY N/A 12/17/2015  ? Procedure: ESOPHAGEAL MANOMETRY (EM);   Surgeon: Mauri Pole, MD;  [facility];  Service: Endoscopy;  Laterality: N/A;  ? HARDWARE REMOVAL Left 10/17/2013  ? Procedure: HARDWARE REMOVAL LEFT HIP;  Surgeon: Gearlean Alf, MD;  [facility];  Service: Orthopedics;  Laterality: Left;  ? HIP FRACTURE SURGERY Left 2006  ? 3 screws  ? INSERTION OF MESH  02/08/2014  ? Procedure: INSERTION OF MESH;  Surgeon: Jackolyn Confer, MD;  [facility];  Service: General;;  ? melanoma surgery   1986, 1997  ? PARATHYROIDECTOMY  04/02/15  ? [place] IMPEDANCE STUDY N/A 12/17/2015  ? Procedure: [place] IMPEDANCE STUDY;  Surgeon: Mauri Pole, MD;  [facility];  Service: Endoscopy;  Laterality: N/A;  ? TOTAL HIP ARTHROPLASTY Left 04/10/2014  ? Procedure: LEFT TOTAL HIP ARTHROPLASTY ANTERIOR APPROACH;  Surgeon: Gearlean Alf, MD;  [facility];  Service: Orthopedics;  Laterality: Left;  ? UMBILICAL HERNIA REPAIR N/A 02/08/2014  ? Procedure: HERNIA REPAIR UMBILICAL ADULT WITH MESH;  Surgeon: Jackolyn Confer, MD;  [facility];  Service: General;  Laterality: N/A;  ? ? ?There were no vitals filed for this visit. ? ? Subjective Assessment - 07/09/21 1226   ? ? Subjective doing better. swelling better but still present and NT   ? Currently in Pain? Yes   ? Pain Score 3    ? Pain Location Ankle   ? Pain Orientation Left   ? ?  ?  ? ?  ? ? ? ? ? ? ? ? ? ? ? ? ? ? ? ? ? ? ? ? [place] Adult PT Treatment/Exercise - 07/09/21 0001   ? ?  ? Lumbar Exercises: Aerobic  ? Recumbent Bike level 2 x 6 minutes   ? Nustep L 5 57mn   ?  ? Lumbar Exercises: Machines for Strengthening  ? Other Lumbar Machine Exercise seated row 15# 2x10, lats 20# 2x10   ? Other Lumbar Machine Exercise 5# straight arm pulls   ?  ? Lumbar Exercises: Standing  ? Other Standing Lumbar Exercises sports cord 20 # 5 x fwd, 3 x each side   ?  ? Vasopneumatic  ? Vasopnuematic Location  Knee   ? Vasopneumatic Pressure Medium   ? Vasopneumatic Temperature  34   ? ?  ?  ? ?   ? ? ? ? ? ? ? ? ? ? ? ? PT Short Term Goals - 03/21/21 1005   ? ?  ? PT SHORT TERM GOAL #1  ? Title independent with HEP   ? Status Achieved   ? ?  ?  ? ?  ? ? ? ? PT Long Term Goals - 07/04/21 1307   ? ?  ? PT LONG TERM GOAL #1  ? Title go up and down stairs step over step consistently   ? Status Partially Met   ? ?  ?  ? ?  ? ? ? ? ? ? ? ? Plan - 07/09/21 1259   ? ? Clinical Impression Statement moving with increased ease. still breathing is biggest issue. progressing ex   ? PT Treatment/Interventions ADLs/Self Care Home Management;Cryotherapy;Electrical Stimulation;Iontophoresis 465mml Dexamethasone;Moist Heat;Gait training;Stair training;Functional mobility training;Therapeutic activities;Therapeutic exercise;Balance training;Neuromuscular re-education;Manual techniques;Patient/family education   ? PT Next Visit Plan 30th visit assessment   ? ?  ?  ? ?  ? ? ?Patient will benefit from skilled therapeutic intervention in order to improve the following deficits and impairments:  Difficulty walking, Abnormal gait, Cardiopulmonary status limiting activity, Decreased endurance, Pain, Decreased activity tolerance, Decreased balance, Impaired flexibility, Decreased strength, Decreased mobility ? ?Visit Diagnosis: ?Muscle weakness (generalized) ? ?Pain in left hip ? ?Difficulty in walking, not elsewhere classified ? ?Localized edema ? ? ? ? ?Problem List ?Patient Active Problem List  ? Diagnosis Date Noted  ? Upper respiratory infection 01/15/2018  ? Postherpetic neuralgia 12/25/2016  ? Dysesthesia 12/25/2016  ? Polyneuropathy 12/25/2016  ? Exposure to influenza 04/01/2016  ? Dysphagia   ? Bronchiectasis without acute exacerbation ([facility]07/07/2015  ? Bronchiectasis ([facility]/20/2017  ? Chronic rhinitis 08/14/2015  ? Avascular necrosis of femur head, left ([facility]02/15/2016  ? OA (osteoarthritis) of hip 04/10/2014  ? Painful orthopaedic hardware ([facility]08/24/2015  ? ? ?PAYSEUR,ANGIE, PTA ?07/09/2021, 1:01 PM ? ?[facility] ?[id number]: [phone] Fax:  [phone] ?Name: Brooke OlsonMRN: [id number] of Birth: 2/12-14-1947 ? ? ?

## 2021-07-12 ENCOUNTER — Ambulatory Visit: Payer: Medicare Other | Admitting: Physical Therapy

## 2021-07-12 DIAGNOSIS — R262 Difficulty in walking, not elsewhere classified: Secondary | ICD-10-CM

## 2021-07-12 DIAGNOSIS — R600 Localized edema: Secondary | ICD-10-CM

## 2021-07-12 DIAGNOSIS — M25572 Pain in left ankle and joints of left foot: Secondary | ICD-10-CM

## 2021-07-12 DIAGNOSIS — M6281 Muscle weakness (generalized): Secondary | ICD-10-CM

## 2021-07-12 DIAGNOSIS — M25552 Pain in left hip: Secondary | ICD-10-CM

## 2021-07-12 NOTE — Therapy (Signed)
[place]. [place], [place], [address] Phone: [phone]   Fax:  [phone] Progress Note Reporting Period 06/05/21 to 07/12/21 for vistis 21-30  See note below for Objective Data and Assessment of Progress/Goals.     Physical Therapy Treatment  Patient Details  Name: Brooke Jimenez MRN: [id number] Date of Birth: September 10, 1945 Referring Provider (PT): Rolena Infante   Encounter Date: 07/12/2021   PT End of Session - 07/12/21 1137     Visit Number 30    Date for PT Re-Evaluation 07/21/21    Authorization Type Medicare    PT Start Time 1100    PT Stop Time 1150    PT Time Calculation (min) 50 min             Past Medical History:  Diagnosis Date   Acne rosacea    Anxiety    Arthritis    arthritis in hip    Bronchiectasis ([place])    Cataract    beginning   Complication of anesthesia    "really sore throat"   Corneal dystrophy    DDD (degenerative disc disease), cervical    and lower back   Diverticulosis    Dysphagia    chronic- please see ultrasound done 4/16 15 in EPIC    Dysrhythmia    sometimes patient has extra beats per patient    Esophageal dysmotility    Family history of adverse reaction to anesthesia    mother slow to wake up    GERD (gastroesophageal reflux disease)    under control   Glaucoma    "suspect"   Gout    one finger x1 attack   H/O hiatal hernia    Hearing loss    bilateral due to nerve loss. wears hearing aides   Heart murmur    no problem    History of chicken pox    History of melanoma    melanoma- 1986 and 1997    Hypothyroidism    Low back pain with sciatica    Measles    hx of   Mitral valve prolapse    Nocturia    Osteoporosis    Peripheral neuropathy    feet   PONV (postoperative nausea and vomiting)    PTSD (post-traumatic stress disorder) 2006   RSD (reflex sympathetic dystrophy) 1993   Spider veins    Urinary incontinence    occasional   Varicose  veins     Past Surgical History:  Procedure Laterality Date   ESOPHAGEAL MANOMETRY N/A 12/17/2015   Procedure: ESOPHAGEAL MANOMETRY (EM);  Surgeon: Mauri Pole, MD;  [facility];  Service: Endoscopy;  Laterality: N/A;   HARDWARE REMOVAL Left 10/17/2013   Procedure: HARDWARE REMOVAL LEFT HIP;  Surgeon: Gearlean Alf, MD;  [facility];  Service: Orthopedics;  Laterality: Left;   HIP FRACTURE SURGERY Left 2006   3 screws   INSERTION OF MESH  02/08/2014   Procedure: INSERTION OF MESH;  Surgeon: Jackolyn Confer, MD;  [facility];  Service: General;;   melanoma surgery   1986, 1997   PARATHYROIDECTOMY  04/02/15   [place] IMPEDANCE STUDY N/A 12/17/2015   Procedure: [place] IMPEDANCE STUDY;  Surgeon: Mauri Pole, MD;  [facility];  Service: Endoscopy;  Laterality: N/A;   TOTAL HIP ARTHROPLASTY Left 04/10/2014   Procedure: LEFT TOTAL HIP ARTHROPLASTY ANTERIOR APPROACH;  Surgeon: Gearlean Alf, MD;  [facility];  Service: Orthopedics;  Laterality: Left;  UMBILICAL HERNIA REPAIR N/A 02/08/2014   Procedure: HERNIA REPAIR UMBILICAL ADULT WITH MESH;  Surgeon: Jackolyn Confer, MD;  [facility];  Service: General;  Laterality: N/A;    There were no vitals filed for this visit.   Subjective Assessment - 07/12/21 1100     Subjective " I think I just need to take a break" get things healed and rested. ankle is stiff and ready for leg to be better.    Currently in Pain? Yes    Pain Score 3     Pain Location Ankle    Pain Orientation Left                               [facility] Adult PT Treatment/Exercise - 07/12/21 0001       Lumbar Exercises: Aerobic   Recumbent Bike level 4 x 6 minutes    Nustep L 5 71mn      Lumbar Exercises: Machines for Strengthening   Other Lumbar Machine Exercise seated row 15# 2x10, lats 20# 2x10      Lumbar Exercises: Supine   Bridge with [employer]15 reps    Bridge with clamshell 15 reps;Compliant    green tband   Bridge with BCardinal HealthLimitations march 20 x alt green tband      Knee/Hip Exercises: Standing   Other Standing Knee Exercises ankle red tband 15 x 4 way      Vasopneumatic   Number Minutes Vasopneumatic  10 minutes    Vasopnuematic Location  Knee    Vasopneumatic Pressure Medium    Vasopneumatic Temperature  34                       PT Short Term Goals - 03/21/21 1005       PT SHORT TERM GOAL #1   Title independent with HEP    Status Achieved               PT Long Term Goals - 07/12/21 1106       PT LONG TERM GOAL #1   Title go up and down stairs step over step consistently    Status Partially Met      PT LONG TERM GOAL #2   Title return to independent gym program    Status Partially Met      PT LONG TERM GOAL #3   Title walk without device with minimal deviation    Status Partially Met      PT LONG TERM GOAL #4   Title report pain decreased 50%    Status Partially Met                   Plan - 07/12/21 1115     Clinical Impression Statement pt arrived feeling lik eshe and her body just needed a break. we discussed that was a good idea and would put on hold for 2 weeks. pt fearful to stop but discussed at visit 30 and need to start thinking about D/C She stated frustrated with all the set back, sprained ankle ,cellulitis and broke toe. Slow progression with LTGS d/t ste back and muclti pain limitations. will return 6/1 for reassessment    PT Treatment/Interventions ADLs/Self Care Home Management;Cryotherapy;Electrical Stimulation;Iontophoresis 470mml Dexamethasone;Moist Heat;Gait training;Stair training;Functional mobility training;Therapeutic activities;Therapeutic exercise;Balance training;Neuromuscular re-education;Manual techniques;Patient/family education    PT Next Visit Plan 30 th visit down. hold until 6/1 then reassess  Patient will benefit from skilled therapeutic intervention in order to  improve the following deficits and impairments:  Difficulty walking, Abnormal gait, Cardiopulmonary status limiting activity, Decreased endurance, Pain, Decreased activity tolerance, Decreased balance, Impaired flexibility, Decreased strength, Decreased mobility  Visit Diagnosis: Muscle weakness (generalized)  Pain in left hip  Difficulty in walking, not elsewhere classified  Localized edema  Pain in left ankle and joints of left foot     Problem List Patient Active Problem List   Diagnosis Date Noted   Upper respiratory infection 01/15/2018   Postherpetic neuralgia 12/25/2016   Dysesthesia 12/25/2016   Polyneuropathy 12/25/2016   Exposure to influenza 04/01/2016   Dysphagia    Bronchiectasis without acute exacerbation ([place]) 08/30/2015   Bronchiectasis ([place]) 08/14/2015   Chronic rhinitis 08/14/2015   Avascular necrosis of femur head, left ([place]) 04/10/2014   OA (osteoarthritis) of hip 04/10/2014   Painful orthopaedic hardware ([place]) 10/17/2013    PAYSEUR,ANGIE, PTA 07/12/2021, 11:38 AM  [place]. [place], [place], [address] Phone: [phone]   Fax:  [phone]  Name: Brooke Jimenez MRN: [id number] Date of Birth: 02-23-1946

## 2021-07-16 ENCOUNTER — Ambulatory Visit: Payer: Medicare Other | Admitting: Physical Therapy

## 2021-07-18 ENCOUNTER — Ambulatory Visit: Payer: Medicare Other | Admitting: Physical Therapy

## 2021-07-23 ENCOUNTER — Ambulatory Visit: Payer: Medicare Other | Admitting: Physical Therapy

## 2021-07-25 ENCOUNTER — Ambulatory Visit: Payer: Medicare Other | Admitting: Physical Therapy

## 2021-07-30 ENCOUNTER — Encounter: Payer: Self-pay | Admitting: Physical Therapy

## 2021-07-30 ENCOUNTER — Ambulatory Visit: Payer: Medicare Other | Attending: Orthopedic Surgery | Admitting: Physical Therapy

## 2021-07-30 DIAGNOSIS — R600 Localized edema: Secondary | ICD-10-CM | POA: Diagnosis present

## 2021-07-30 DIAGNOSIS — M25552 Pain in left hip: Secondary | ICD-10-CM | POA: Diagnosis present

## 2021-07-30 DIAGNOSIS — G8929 Other chronic pain: Secondary | ICD-10-CM | POA: Insufficient documentation

## 2021-07-30 DIAGNOSIS — M545 Low back pain, unspecified: Secondary | ICD-10-CM | POA: Insufficient documentation

## 2021-07-30 DIAGNOSIS — R262 Difficulty in walking, not elsewhere classified: Secondary | ICD-10-CM | POA: Diagnosis present

## 2021-07-30 DIAGNOSIS — M6281 Muscle weakness (generalized): Secondary | ICD-10-CM | POA: Diagnosis present

## 2021-07-30 DIAGNOSIS — M25572 Pain in left ankle and joints of left foot: Secondary | ICD-10-CM | POA: Insufficient documentation

## 2021-07-30 NOTE — Therapy (Signed)
[place]. [place], [place], [address] Phone: [phone]   Fax:  [phone]  Physical Therapy Treatment  Patient Details  Name: Brooke Jimenez MRN: [id number] Date of Birth: 1945/03/10 Referring Provider (PT): Rolena Infante   Encounter Date: 07/30/2021   PT End of Session - 07/30/21 1541     Visit Number 31    Date for PT Re-Evaluation 08/29/21    Authorization Type Medicare    PT Start Time 1530    PT Stop Time 1615    PT Time Calculation (min) 45 min    Activity Tolerance Patient tolerated treatment well    Behavior During Therapy [facility] for tasks assessed/performed             Past Medical History:  Diagnosis Date   Acne rosacea    Anxiety    Arthritis    arthritis in hip    Bronchiectasis ([place])    Cataract    beginning   Complication of anesthesia    "really sore throat"   Corneal dystrophy    DDD (degenerative disc disease), cervical    and lower back   Diverticulosis    Dysphagia    chronic- please see ultrasound done 4/16 15 in EPIC    Dysrhythmia    sometimes patient has extra beats per patient    Esophageal dysmotility    Family history of adverse reaction to anesthesia    mother slow to wake up    GERD (gastroesophageal reflux disease)    under control   Glaucoma    "suspect"   Gout    one finger x1 attack   H/O hiatal hernia    Hearing loss    bilateral due to nerve loss. wears hearing aides   Heart murmur    no problem    History of chicken pox    History of melanoma    melanoma- 1986 and 1997    Hypothyroidism    Low back pain with sciatica    Measles    hx of   Mitral valve prolapse    Nocturia    Osteoporosis    Peripheral neuropathy    feet   PONV (postoperative nausea and vomiting)    PTSD (post-traumatic stress disorder) 2006   RSD (reflex sympathetic dystrophy) 1993   Spider veins    Urinary incontinence    occasional   Varicose veins     Past Surgical  History:  Procedure Laterality Date   ESOPHAGEAL MANOMETRY N/A 12/17/2015   Procedure: ESOPHAGEAL MANOMETRY (EM);  Surgeon: Mauri Pole, MD;  [facility];  Service: Endoscopy;  Laterality: N/A;   HARDWARE REMOVAL Left 10/17/2013   Procedure: HARDWARE REMOVAL LEFT HIP;  Surgeon: Gearlean Alf, MD;  [facility];  Service: Orthopedics;  Laterality: Left;   HIP FRACTURE SURGERY Left 2006   3 screws   INSERTION OF MESH  02/08/2014   Procedure: INSERTION OF MESH;  Surgeon: Jackolyn Confer, MD;  [facility];  Service: General;;   melanoma surgery   1986, 1997   PARATHYROIDECTOMY  04/02/15   [place] IMPEDANCE STUDY N/A 12/17/2015   Procedure: [place] IMPEDANCE STUDY;  Surgeon: Mauri Pole, MD;  [facility];  Service: Endoscopy;  Laterality: N/A;   TOTAL HIP ARTHROPLASTY Left 04/10/2014   Procedure: LEFT TOTAL HIP ARTHROPLASTY ANTERIOR APPROACH;  Surgeon: Gearlean Alf, MD;  [facility];  Service: Orthopedics;  Laterality: Left;   UMBILICAL HERNIA REPAIR N/A  02/08/2014   Procedure: HERNIA REPAIR UMBILICAL ADULT WITH MESH;  Surgeon: Jackolyn Confer, MD;  [facility];  Service: General;  Laterality: N/A;    There were no vitals filed for this visit.   Subjective Assessment - 07/30/21 1542     Subjective Last time we saw her was about 2-3 weeks ago, she wanted a break   Heer leg looks better, but still hurts and is still very swollen and stiff, she reports some back pain, right shoulder pain and balance issues.  she has been seeing MD's about the toe injury and the cellulitis and infection, she is still on Keflex. She is frustrated reporting tha tshe has fallen behind in her Zoom pulmonary class    Currently in Pain? Yes    Pain Score 4    right shoulder               [facility] PT Assessment - 07/30/21 0001       AROM   Overall AROM Comments right shoulder flexion 145, IR 20, ER 80, pain with abduction and eccentric flexion      Strength    Overall Strength Comments right shoulder strength 4/5 but very pain ful flexion and abduction      Special Tests   Other special tests negative empty can, negative impingement                           [facility] Adult PT Treatment/Exercise - 07/30/21 0001       Lumbar Exercises: Stretches   Piriformis Stretch Left;3 reps;20 seconds      Lumbar Exercises: Aerobic   Nustep L 5 52mn      Lumbar Exercises: Machines for Strengthening   Other Lumbar Machine Exercise 5# straight arm pulls      Lumbar Exercises: Supine   Other Supine Lumbar Exercises feet on ball K2C, trunk rotation, small bridges, isometrica abs      Knee/Hip Exercises: Supine   Short Arc Quad Sets Both;2 sets;10 reps    Short Arc Quad Sets Limitations 3    Other Supine Knee/Hip Exercises ankle pumps and circles in elevation                       PT Short Term Goals - 03/21/21 1005       PT SHORT TERM GOAL #1   Title independent with HEP    Status Achieved               PT Long Term Goals - 07/30/21 1616       PT LONG TERM GOAL #1   Title go up and down stairs step over step consistently    Status Partially Met      PT LONG TERM GOAL #2   Title return to independent gym program    Status Partially Met      PT LONG TERM GOAL #3   Title walk without device with minimal deviation    Status Partially Met      PT LONG TERM GOAL #4   Title report pain decreased 50%    Status Partially Met      PT LONG TERM GOAL #5   Title decrease TUG time to 13 seconds    Status Achieved                   Plan - 07/30/21 1616     Clinical Impression Statement Patient  has been seen by [place] for back pain, she then had a fall and suffered a severe hematoma of the left lower leg, she then developed cellulitis and this was improving until she stubbed her toe and tore off the toenail.  She has really had a lot of issues with this leg and has had setbacks.  She is very frustrated as  she is struggling with walking, very stiff in the ankle, very swollen lower leg.  has not been able to keep up with her zoom class for pulmonary function.  She has leg pain, shoulder pain and back pain as well as issues with balance, she took the last 2+ weeks off due to "needing a break mentally and physically"    PT Frequency 2x / week    PT Duration 4 weeks    PT Treatment/Interventions ADLs/Self Care Home Management;Cryotherapy;Electrical Stimulation;Iontophoresis 5m/ml Dexamethasone;Moist Heat;Gait training;Stair training;Functional mobility training;Therapeutic activities;Therapeutic exercise;Balance training;Neuromuscular re-education;Manual techniques;Patient/family education    PT Next Visit Plan Patient wants to come back and wants to try to get to the point she can do exercises on her own, whether at home or in a gym setting or the zoom class    Consulted and Agree with Plan of Care Patient             Patient will benefit from skilled therapeutic intervention in order to improve the following deficits and impairments:  Difficulty walking, Abnormal gait, Cardiopulmonary status limiting activity, Decreased endurance, Pain, Decreased activity tolerance, Decreased balance, Impaired flexibility, Decreased strength, Decreased mobility  Visit Diagnosis: Muscle weakness (generalized)  Pain in left hip  Difficulty in walking, not elsewhere classified  Localized edema  Pain in left ankle and joints of left foot  Chronic bilateral low back pain without sciatica     Problem List Patient Active Problem List   Diagnosis Date Noted   Upper respiratory infection 01/15/2018   Postherpetic neuralgia 12/25/2016   Dysesthesia 12/25/2016   Polyneuropathy 12/25/2016   Exposure to influenza 04/01/2016   Dysphagia    [place] exacerbation ([place] 08/30/2015   Bronchiectasis ([place] 08/14/2015   Chronic rhinitis 08/14/2015   Avascular necrosis of femur head, left ([place]  04/10/2014   OA (osteoarthritis) of hip 04/10/2014   Painful orthopaedic hardware ([place] 10/17/2013    ASumner Boast PT 07/30/2021, 4:48 PM  [place] [address]Phone: [phone]  Fax:  [phone] Name: CAudrinna ShermanMRN: [id number] of Birth: [birth year]

## 2021-08-01 ENCOUNTER — Ambulatory Visit: Payer: Medicare Other | Admitting: Physical Therapy

## 2021-08-01 DIAGNOSIS — M6281 Muscle weakness (generalized): Secondary | ICD-10-CM | POA: Diagnosis not present

## 2021-08-01 DIAGNOSIS — M25552 Pain in left hip: Secondary | ICD-10-CM

## 2021-08-01 DIAGNOSIS — R262 Difficulty in walking, not elsewhere classified: Secondary | ICD-10-CM

## 2021-08-01 NOTE — Therapy (Signed)
[place]. [place], [place], [address] Phone: [phone]   Fax:  [phone]  Physical Therapy Treatment  Patient Details  Name: Brooke Jimenez MRN: [id number] Date of Birth: 12/11/1945 Referring Provider (PT): Rolena Infante   Encounter Date: 08/01/2021   PT End of Session - 08/01/21 0953     Visit Number 34    Date for PT Re-Evaluation 08/29/21    Authorization Type Medicare    PT Start Time 0930    PT Stop Time 1015    PT Time Calculation (min) 45 min             Past Medical History:  Diagnosis Date   Acne rosacea    Anxiety    Arthritis    arthritis in hip    Bronchiectasis ([place])    Cataract    beginning   Complication of anesthesia    "really sore throat"   Corneal dystrophy    DDD (degenerative disc disease), cervical    and lower back   Diverticulosis    Dysphagia    chronic- please see ultrasound done 4/16 15 in EPIC    Dysrhythmia    sometimes patient has extra beats per patient    Esophageal dysmotility    Family history of adverse reaction to anesthesia    mother slow to wake up    GERD (gastroesophageal reflux disease)    under control   Glaucoma    "suspect"   Gout    one finger x1 attack   H/O hiatal hernia    Hearing loss    bilateral due to nerve loss. wears hearing aides   Heart murmur    no problem    History of chicken pox    History of melanoma    melanoma- 1986 and 1997    Hypothyroidism    Low back pain with sciatica    Measles    hx of   Mitral valve prolapse    Nocturia    Osteoporosis    Peripheral neuropathy    feet   PONV (postoperative nausea and vomiting)    PTSD (post-traumatic stress disorder) 2006   RSD (reflex sympathetic dystrophy) 1993   Spider veins    Urinary incontinence    occasional   Varicose veins     Past Surgical History:  Procedure Laterality Date   ESOPHAGEAL MANOMETRY N/A 12/17/2015   Procedure: ESOPHAGEAL MANOMETRY (EM);   Surgeon: Mauri Pole, MD;  [facility];  Service: Endoscopy;  Laterality: N/A;   HARDWARE REMOVAL Left 10/17/2013   Procedure: HARDWARE REMOVAL LEFT HIP;  Surgeon: Gearlean Alf, MD;  [facility];  Service: Orthopedics;  Laterality: Left;   HIP FRACTURE SURGERY Left 2006   3 screws   INSERTION OF MESH  02/08/2014   Procedure: INSERTION OF MESH;  Surgeon: Jackolyn Confer, MD;  [facility];  Service: General;;   melanoma surgery   1986, 1997   PARATHYROIDECTOMY  04/02/15   [place] IMPEDANCE STUDY N/A 12/17/2015   Procedure: [place] IMPEDANCE STUDY;  Surgeon: Mauri Pole, MD;  [facility];  Service: Endoscopy;  Laterality: N/A;   TOTAL HIP ARTHROPLASTY Left 04/10/2014   Procedure: LEFT TOTAL HIP ARTHROPLASTY ANTERIOR APPROACH;  Surgeon: Gearlean Alf, MD;  [facility];  Service: Orthopedics;  Laterality: Left;   UMBILICAL HERNIA REPAIR N/A 02/08/2014   Procedure: HERNIA REPAIR UMBILICAL ADULT WITH MESH;  Surgeon: Jackolyn Confer, MD;  [facility];  Service: General;  Laterality: N/A;    There were no vitals filed for this visit.   Subjective Assessment - 08/01/21 0933     Subjective main complaint today is LB- working on purging files out of [place]. staying in d/t smoke    Currently in Pain? Yes    Pain Score 5     Pain Location Back                               [facility] Adult PT Treatment/Exercise - 08/01/21 0001       Lumbar Exercises: Aerobic   UBE (Upper Arm Bike) L 2 2 min fwd/2 mn backward    Nustep L 5 36mn      Lumbar Exercises: Machines for Strengthening   Cybex Lumbar Extension black tband flex and ext 2 sets 10    Other Lumbar Machine Exercise seated row 15# 2x10, lats 20# 2x10      Knee/Hip Exercises: Machines for Strengthening   Cybex Knee Extension 5# 2x10    Cybex Knee Flexion 15# 10x,20# 10 x                       PT Short Term Goals - 03/21/21 1005       PT SHORT TERM GOAL #1    Title independent with HEP    Status Achieved               PT Long Term Goals - 07/30/21 1616       PT LONG TERM GOAL #1   Title go up and down stairs step over step consistently    Status Partially Met      PT LONG TERM GOAL #2   Title return to independent gym program    Status Partially Met      PT LONG TERM GOAL #3   Title walk without device with minimal deviation    Status Partially Met      PT LONG TERM GOAL #4   Title report pain decreased 50%    Status Partially Met      PT LONG TERM GOAL #5   Title decrease TUG time to 13 seconds    Status Achieved                   Plan - 08/01/21 0953     Clinical Impression Statement pt arrived with pain all over but cheif complaint LBP. pt wants to focus on getting back to overall strength to increase tolerance to ADL.pt tolerated well with rest breaks    PT Treatment/Interventions ADLs/Self Care Home Management;Cryotherapy;Electrical Stimulation;Iontophoresis 482mml Dexamethasone;Moist Heat;Gait training;Stair training;Functional mobility training;Therapeutic activities;Therapeutic exercise;Balance training;Neuromuscular re-education;Manual techniques;Patient/family education    PT Next Visit Plan returned to ther ex             Patient will benefit from skilled therapeutic intervention in order to improve the following deficits and impairments:  Difficulty walking, Abnormal gait, Cardiopulmonary status limiting activity, Decreased endurance, Pain, Decreased activity tolerance, Decreased balance, Impaired flexibility, Decreased strength, Decreased mobility  Visit Diagnosis: Muscle weakness (generalized)  Pain in left hip  Difficulty in walking, not elsewhere classified     Problem List Patient Active Problem List   Diagnosis Date Noted   Upper respiratory infection 01/15/2018   Postherpetic neuralgia 12/25/2016   Dysesthesia 12/25/2016   Polyneuropathy 12/25/2016   Exposure to influenza  04/01/2016   Dysphagia  Bronchiectasis without acute exacerbation ([place]) 08/30/2015   Bronchiectasis ([place]) 08/14/2015   Chronic rhinitis 08/14/2015   Avascular necrosis of femur head, left ([place]) 04/10/2014   OA (osteoarthritis) of hip 04/10/2014   Painful orthopaedic hardware ([place]) 10/17/2013    Alper Guilmette,ANGIE, PTA 08/01/2021, 9:56 AM  [place]. [place], [place], [address] Phone: [phone]   Fax:  [phone]  Name: Brooke Jimenez MRN: [id number] Date of Birth: [birth year]

## 2021-08-07 ENCOUNTER — Encounter: Payer: Self-pay | Admitting: Physical Therapy

## 2021-08-07 ENCOUNTER — Ambulatory Visit: Payer: Medicare Other | Admitting: Physical Therapy

## 2021-08-07 DIAGNOSIS — M545 Low back pain, unspecified: Secondary | ICD-10-CM

## 2021-08-07 DIAGNOSIS — M25552 Pain in left hip: Secondary | ICD-10-CM

## 2021-08-07 DIAGNOSIS — M25572 Pain in left ankle and joints of left foot: Secondary | ICD-10-CM

## 2021-08-07 DIAGNOSIS — M6281 Muscle weakness (generalized): Secondary | ICD-10-CM | POA: Diagnosis not present

## 2021-08-07 DIAGNOSIS — G8929 Other chronic pain: Secondary | ICD-10-CM

## 2021-08-07 DIAGNOSIS — R262 Difficulty in walking, not elsewhere classified: Secondary | ICD-10-CM

## 2021-08-07 DIAGNOSIS — R600 Localized edema: Secondary | ICD-10-CM

## 2021-08-07 NOTE — Therapy (Signed)
[place]. [place], [place], [address] Phone: [phone]   Fax:  [phone]  Physical Therapy Treatment  Patient Details  Name: Brooke Jimenez MRN: [id number] Date of Birth: Dec 22, 1945 Referring Provider (PT): Rolena Infante   Encounter Date: 08/07/2021   PT End of Session - 08/07/21 1321     Visit Number 28    Date for PT Re-Evaluation 08/29/21    Authorization Type Medicare    PT Start Time 1314    PT Stop Time 1410    PT Time Calculation (min) 56 min    Activity Tolerance Patient tolerated treatment well    Behavior During Therapy [facility] for tasks assessed/performed             Past Medical History:  Diagnosis Date   Acne rosacea    Anxiety    Arthritis    arthritis in hip    Bronchiectasis ([place])    Cataract    beginning   Complication of anesthesia    "really sore throat"   Corneal dystrophy    DDD (degenerative disc disease), cervical    and lower back   Diverticulosis    Dysphagia    chronic- please see ultrasound done 4/16 15 in EPIC    Dysrhythmia    sometimes patient has extra beats per patient    Esophageal dysmotility    Family history of adverse reaction to anesthesia    mother slow to wake up    GERD (gastroesophageal reflux disease)    under control   Glaucoma    "suspect"   Gout    one finger x1 attack   H/O hiatal hernia    Hearing loss    bilateral due to nerve loss. wears hearing aides   Heart murmur    no problem    History of chicken pox    History of melanoma    melanoma- 1986 and 1997    Hypothyroidism    Low back pain with sciatica    Measles    hx of   Mitral valve prolapse    Nocturia    Osteoporosis    Peripheral neuropathy    feet   PONV (postoperative nausea and vomiting)    PTSD (post-traumatic stress disorder) 2006   RSD (reflex sympathetic dystrophy) 1993   Spider veins    Urinary incontinence    occasional   Varicose veins     Past Surgical  History:  Procedure Laterality Date   ESOPHAGEAL MANOMETRY N/A 12/17/2015   Procedure: ESOPHAGEAL MANOMETRY (EM);  Surgeon: Mauri Pole, MD;  [facility];  Service: Endoscopy;  Laterality: N/A;   HARDWARE REMOVAL Left 10/17/2013   Procedure: HARDWARE REMOVAL LEFT HIP;  Surgeon: Gearlean Alf, MD;  [facility];  Service: Orthopedics;  Laterality: Left;   HIP FRACTURE SURGERY Left 2006   3 screws   INSERTION OF MESH  02/08/2014   Procedure: INSERTION OF MESH;  Surgeon: Jackolyn Confer, MD;  [facility];  Service: General;;   melanoma surgery   1986, 1997   PARATHYROIDECTOMY  04/02/15   [place] IMPEDANCE STUDY N/A 12/17/2015   Procedure: [place] IMPEDANCE STUDY;  Surgeon: Mauri Pole, MD;  [facility];  Service: Endoscopy;  Laterality: N/A;   TOTAL HIP ARTHROPLASTY Left 04/10/2014   Procedure: LEFT TOTAL HIP ARTHROPLASTY ANTERIOR APPROACH;  Surgeon: Gearlean Alf, MD;  [facility];  Service: Orthopedics;  Laterality: Left;   UMBILICAL HERNIA REPAIR N/A  02/08/2014   Procedure: HERNIA REPAIR UMBILICAL ADULT WITH MESH;  Surgeon: Jackolyn Confer, MD;  [facility];  Service: General;  Laterality: N/A;    There were no vitals filed for this visit.   Subjective Assessment - 08/07/21 1321     Subjective Low back still hurting, I was sore but did okay, almost a fall and it kind of pulled my back    Currently in Pain? Yes    Pain Score 5     Pain Location Back    Pain Orientation Left;Lower    Aggravating Factors  almost falling, going through papers                               [facility] Adult PT Treatment/Exercise - 08/07/21 0001       Lumbar Exercises: Aerobic   Nustep L 5 65mn      Lumbar Exercises: Machines for Strengthening   Cybex Lumbar Extension black tband flex and ext 2 sets 10    Other Lumbar Machine Exercise seated row 15# 2x10, lats 20# 2x10    Other Lumbar Machine Exercise 5# straight arm pulls      Knee/Hip  Exercises: Supine   Short Arc Quad Sets Both;2 sets;10 reps    Short Arc Quad Sets Limitations 3    Other Supine Knee/Hip Exercises ankle pumps and circles in elevation                       PT Short Term Goals - 03/21/21 1005       PT SHORT TERM GOAL #1   Title independent with HEP    Status Achieved               PT Long Term Goals - 08/07/21 1322       PT LONG TERM GOAL #1   Title go up and down stairs step over step consistently    Status Partially Met      PT LONG TERM GOAL #2   Title return to independent gym program    Status Partially Met                   Plan - 08/07/21 1412     Clinical Impression Statement Doing a little better, she will meet with the pulmonary team tomorrow about her class, she gives good effort, she is scared and seems to not know waht to do or where to start with her exercises.  She has a lot of fear due to the issues of the back and the leg.  Gives good effort and asks a lot of questions    PT Next Visit Plan talked with her about what she can start doing to get ready for after D/C from PT             Patient will benefit from skilled therapeutic intervention in order to improve the following deficits and impairments:  Difficulty walking, Abnormal gait, Cardiopulmonary status limiting activity, Decreased endurance, Pain, Decreased activity tolerance, Decreased balance, Impaired flexibility, Decreased strength, Decreased mobility  Visit Diagnosis: Muscle weakness (generalized)  Pain in left hip  Difficulty in walking, not elsewhere classified  Localized edema  Pain in left ankle and joints of left foot  Chronic bilateral low back pain without sciatica     Problem List Patient Active Problem List   Diagnosis Date Noted   Upper respiratory infection 01/15/2018  Postherpetic neuralgia 12/25/2016   Dysesthesia 12/25/2016   Polyneuropathy 12/25/2016   Exposure to influenza 04/01/2016   Dysphagia     Bronchiectasis without acute exacerbation ([place]) 08/30/2015   Bronchiectasis ([place]) 08/14/2015   Chronic rhinitis 08/14/2015   Avascular necrosis of femur head, left ([place]) 04/10/2014   OA (osteoarthritis) of hip 04/10/2014   Painful orthopaedic hardware ([place]) 10/17/2013    Sumner Boast, PT 08/07/2021, 2:26 PM  [place]. [place], [place], [address] Phone: [phone]   Fax:  [phone]  Name: Brooke Jimenez MRN: [id number] Date of Birth: 1945/06/19

## 2021-08-09 ENCOUNTER — Ambulatory Visit: Payer: Medicare Other | Admitting: Physical Therapy

## 2021-08-09 DIAGNOSIS — M6281 Muscle weakness (generalized): Secondary | ICD-10-CM

## 2021-08-09 DIAGNOSIS — R262 Difficulty in walking, not elsewhere classified: Secondary | ICD-10-CM

## 2021-08-09 DIAGNOSIS — M545 Low back pain, unspecified: Secondary | ICD-10-CM

## 2021-08-09 NOTE — Therapy (Signed)
[place] [place], [place], [address] Phone: [phone]   Fax:  [phone]  Physical Therapy Treatment  Patient Details  Name: Brooke Jimenez MRN: [id number] Date of Birth: 07-Dec-1945 Referring Provider (PT): Rolena Infante   Encounter Date: 08/09/2021   PT End of Session - 08/09/21 1058     Visit Number 34    Date for PT Re-Evaluation 08/29/21    Authorization Type Medicare    PT Start Time 1010    PT Stop Time 1058    PT Time Calculation (min) 48 min             Past Medical History:  Diagnosis Date   Acne rosacea    Anxiety    Arthritis    arthritis in hip    Bronchiectasis ([place])    Cataract    beginning   Complication of anesthesia    "really sore throat"   Corneal dystrophy    DDD (degenerative disc disease), cervical    and lower back   Diverticulosis    Dysphagia    chronic- please see ultrasound done 4/16 15 in EPIC    Dysrhythmia    sometimes patient has extra beats per patient    Esophageal dysmotility    Family history of adverse reaction to anesthesia    mother slow to wake up    GERD (gastroesophageal reflux disease)    under control   Glaucoma    "suspect"   Gout    one finger x1 attack   H/O hiatal hernia    Hearing loss    bilateral due to nerve loss. wears hearing aides   Heart murmur    no problem    History of chicken pox    History of melanoma    melanoma- 1986 and 1997    Hypothyroidism    Low back pain with sciatica    Measles    hx of   Mitral valve prolapse    Nocturia    Osteoporosis    Peripheral neuropathy    feet   PONV (postoperative nausea and vomiting)    PTSD (post-traumatic stress disorder) 2006   RSD (reflex sympathetic dystrophy) 1993   Spider veins    Urinary incontinence    occasional   Varicose veins     Past Surgical History:  Procedure Laterality Date   ESOPHAGEAL MANOMETRY N/A 12/17/2015   Procedure: ESOPHAGEAL MANOMETRY (EM);   Surgeon: Mauri Pole, MD;  [facility]: [facility];  Service: Endoscopy;  Laterality: N/A;   HARDWARE REMOVAL Left 10/17/2013   Procedure: HARDWARE REMOVAL LEFT HIP;  Surgeon: Gearlean Alf, MD;  Location: [facility];  Service: Orthopedics;  Laterality: Left;   HIP FRACTURE SURGERY Left 2006   3 screws   INSERTION OF MESH  02/08/2014   Procedure: INSERTION OF MESH;  Surgeon: Jackolyn Confer, MD;  Location: [facility];  Service: General;;   melanoma surgery   1986, 1997   PARATHYROIDECTOMY  04/02/15   [place] IMPEDANCE STUDY N/A 12/17/2015   Procedure: [place] IMPEDANCE STUDY;  Surgeon: Mauri Pole, MD;  [facility];  Service: Endoscopy;  Laterality: N/A;   TOTAL HIP ARTHROPLASTY Left 04/10/2014   Procedure: LEFT TOTAL HIP ARTHROPLASTY ANTERIOR APPROACH;  Surgeon: Gearlean Alf, MD;  [facility];  Service: Orthopedics;  Laterality: Left;   UMBILICAL HERNIA REPAIR N/A 02/08/2014   Procedure: HERNIA REPAIR UMBILICAL ADULT WITH MESH;  Surgeon: Jackolyn Confer, MD;  [facility];  Service: General;  Laterality: N/A;    There were no vitals filed for this visit.   Subjective Assessment - 08/09/21 1017     Subjective saw pulm MD and new class starts Monday so I will satrt    Currently in Pain? Yes    Pain Score 5     Pain Location Back    Pain Orientation Lower;Right;Left                               [facility] Adult PT Treatment/Exercise - 08/09/21 0001       Lumbar Exercises: Aerobic   Nustep L 5 37mn      Lumbar Exercises: Machines for Strengthening   Cybex Lumbar Extension black tband flex and ext 2 sets 10    Other Lumbar Machine Exercise seated row 15# 2x10, lats 20# 2x10      Lumbar Exercises: Standing   Other Standing Lumbar Exercises 2# hip ext and abd 10 x    Other Standing Lumbar Exercises 3# stick bicep curls, 2# stick chest press and shld flex 2#10 x      Lumbar Exercises: Seated   Long Arc Quad on Chair Strengthening;Both;10 reps     LAQ on Chair Weights (lbs) 2                       PT Short Term Goals - 03/21/21 1005       PT SHORT TERM GOAL #1   Title independent with HEP    Status Achieved               PT Long Term Goals - 08/07/21 1322       PT LONG TERM GOAL #1   Title go up and down stairs step over step consistently    Status Partially Met      PT LONG TERM GOAL #2   Title return to independent gym program    Status Partially Met                   Plan - 08/09/21 1058     Clinical Impression Statement progressed ex and added some standing ex. dicucssed safety with Zoom ex and working towards independant gym ex here    PT Treatment/Interventions ADLs/Self Care Home Management;Cryotherapy;Electrical Stimulation;Iontophoresis 455mml Dexamethasone;Moist Heat;Gait training;Stair training;Functional mobility training;Therapeutic activities;Therapeutic exercise;Balance training;Neuromuscular re-education;Manual techniques;Patient/family education    PT Next Visit Plan see how zoom class went and continue talking with her about what she can start doing to get ready for after D/C from PT             Patient will benefit from skilled therapeutic intervention in order to improve the following deficits and impairments:  Difficulty walking, Abnormal gait, Cardiopulmonary status limiting activity, Decreased endurance, Pain, Decreased activity tolerance, Decreased balance, Impaired flexibility, Decreased strength, Decreased mobility  Visit Diagnosis: Muscle weakness (generalized)  Difficulty in walking, not elsewhere classified  Chronic bilateral low back pain without sciatica     Problem List Patient Active Problem List   Diagnosis Date Noted   Upper respiratory infection 01/15/2018   Postherpetic neuralgia 12/25/2016   Dysesthesia 12/25/2016   Polyneuropathy 12/25/2016   Exposure to influenza 04/01/2016   Dysphagia    Bronchiectasis without acute exacerbation ([facility] 08/30/2015   Bronchiectasis ([facility]06/20/2017   Chronic rhinitis 08/14/2015   Avascular necrosis of femur head, left ([facility]02/15/2016   OA (osteoarthritis) of  hip 04/10/2014   Painful orthopaedic hardware ([place]) 10/17/2013    Ratasha Fabre,ANGIE, PTA 08/09/2021, 11:01 AM  [place]. [place], [place], [address] Phone: [phone]   Fax:  [phone]  Name: Najah Liverman MRN: [id number] Date of Birth: 1945/05/06

## 2021-08-14 ENCOUNTER — Encounter: Payer: Self-pay | Admitting: Physical Therapy

## 2021-08-14 ENCOUNTER — Ambulatory Visit: Payer: Medicare Other | Admitting: Physical Therapy

## 2021-08-14 DIAGNOSIS — M545 Other chronic pain: Secondary | ICD-10-CM

## 2021-08-14 DIAGNOSIS — R262 Difficulty in walking, not elsewhere classified: Secondary | ICD-10-CM

## 2021-08-14 DIAGNOSIS — M25572 Pain in left ankle and joints of left foot: Secondary | ICD-10-CM

## 2021-08-14 DIAGNOSIS — M6281 Muscle weakness (generalized): Secondary | ICD-10-CM

## 2021-08-14 DIAGNOSIS — R600 Localized edema: Secondary | ICD-10-CM

## 2021-08-14 DIAGNOSIS — M25552 Pain in left hip: Secondary | ICD-10-CM

## 2021-08-14 DIAGNOSIS — G8929 Other chronic pain: Secondary | ICD-10-CM

## 2021-08-14 NOTE — Therapy (Signed)
[place]. [place], [place], [address] Phone: [phone]   Fax:  [phone]  Physical Therapy Treatment  Patient Details  Name: Brooke Jimenez MRN: [id number] Date of Birth: 1946/02/06 Referring Provider (PT): Rolena Infante   Encounter Date: 08/14/2021   PT End of Session - 08/14/21 1716     Visit Number 35    Date for PT Re-Evaluation 08/29/21    Authorization Type Medicare    PT Start Time 1700    PT Stop Time 1751    PT Time Calculation (min) 51 min    Activity Tolerance Patient tolerated treatment well    Behavior During Therapy [facility] for tasks assessed/performed             Past Medical History:  Diagnosis Date   Acne rosacea    Anxiety    Arthritis    arthritis in hip    Bronchiectasis ([place])    Cataract    beginning   Complication of anesthesia    "really sore throat"   Corneal dystrophy    DDD (degenerative disc disease), cervical    and lower back   Diverticulosis    Dysphagia    chronic- please see ultrasound done 4/16 15 in EPIC    Dysrhythmia    sometimes patient has extra beats per patient    Esophageal dysmotility    Family history of adverse reaction to anesthesia    mother slow to wake up    GERD (gastroesophageal reflux disease)    under control   Glaucoma    "suspect"   Gout    one finger x1 attack   H/O hiatal hernia    Hearing loss    bilateral due to nerve loss. wears hearing aides   Heart murmur    no problem    History of chicken pox    History of melanoma    melanoma- 1986 and 1997    Hypothyroidism    Low back pain with sciatica    Measles    hx of   Mitral valve prolapse    Nocturia    Osteoporosis    Peripheral neuropathy    feet   PONV (postoperative nausea and vomiting)    PTSD (post-traumatic stress disorder) 2006   RSD (reflex sympathetic dystrophy) 1993   Spider veins    Urinary incontinence    occasional   Varicose veins     Past Surgical  History:  Procedure Laterality Date   ESOPHAGEAL MANOMETRY N/A 12/17/2015   Procedure: ESOPHAGEAL MANOMETRY (EM);  Surgeon: Mauri Pole, MD;  [facility];  Service: Endoscopy;  Laterality: N/A;   HARDWARE REMOVAL Left 10/17/2013   Procedure: HARDWARE REMOVAL LEFT HIP;  Surgeon: Gearlean Alf, MD;  [facility];  Service: Orthopedics;  Laterality: Left;   HIP FRACTURE SURGERY Left 2006   3 screws   INSERTION OF MESH  02/08/2014   Procedure: INSERTION OF MESH;  Surgeon: Jackolyn Confer, MD;  [facility];  Service: General;;   melanoma surgery   1986, 1997   PARATHYROIDECTOMY  04/02/15   [place] IMPEDANCE STUDY N/A 12/17/2015   Procedure: [place] IMPEDANCE STUDY;  Surgeon: Mauri Pole, MD;  [facility];  Service: Endoscopy;  Laterality: N/A;   TOTAL HIP ARTHROPLASTY Left 04/10/2014   Procedure: LEFT TOTAL HIP ARTHROPLASTY ANTERIOR APPROACH;  Surgeon: Gearlean Alf, MD;  [facility];  Service: Orthopedics;  Laterality: Left;   UMBILICAL HERNIA REPAIR N/A  02/08/2014   Procedure: HERNIA REPAIR UMBILICAL ADULT WITH MESH;  Surgeon: Jackolyn Confer, MD;  [facility];  Service: General;  Laterality: N/A;    There were no vitals filed for this visit.   Subjective Assessment - 08/14/21 1716     Subjective Patient reports that she has not started the new zoom class, has some concerns    Currently in Pain? Yes    Pain Score 4     Pain Location Back                               [facility] Adult PT Treatment/Exercise - 08/14/21 0001       Ambulation/Gait   Gait Comments 3 laps inside 330 feet in 2 minutes after O2 97%, HR 106      Lumbar Exercises: Aerobic   Nustep L 6 33mn      Knee/Hip Exercises: Standing   Knee Flexion Both;2 sets;10 reps    Knee Flexion Limitations 2#    Hip Abduction Both;3 sets;10 reps    Abduction Limitations 2#    Hip Extension Both;2 sets;10 reps    Extension Limitations 2.5      Knee/Hip  Exercises: Seated   Long Arc Quad Strengthening;Both;10 reps;Weights    Long Arc Quad Weight 3 lbs.    Other Seated Knee/Hip Exercises 2# curls, hammer curls and punches    Marching Strengthening;Both;2 sets;10 reps    Marching Limitations 2#                       PT Short Term Goals - 03/21/21 1005       PT SHORT TERM GOAL #1   Title independent with HEP    Status Achieved               PT Long Term Goals - 08/07/21 1322       PT LONG TERM GOAL #1   Title go up and down stairs step over step consistently    Status Partially Met      PT LONG TERM GOAL #2   Title return to independent gym program    Status Partially Met                   Plan - 08/14/21 1746     Clinical Impression Statement I tried to replicate an exercise session with her for her pulmonary class as she stopped doing it and is afraid to go back, she is afraid she cannot do it and afraid of back and leg pain.  She did have some leg and back pain with the exercises today.  Oxygen and HR were good with the walking    PT Next Visit Plan see how what we did today did with her soreness and pain    Consulted and Agree with Plan of Care Patient             Patient will benefit from skilled therapeutic intervention in order to improve the following deficits and impairments:  Difficulty walking, Abnormal gait, Cardiopulmonary status limiting activity, Decreased endurance, Pain, Decreased activity tolerance, Decreased balance, Impaired flexibility, Decreased strength, Decreased mobility  Visit Diagnosis: Muscle weakness (generalized)  Difficulty in walking, not elsewhere classified  Chronic bilateral low back pain without sciatica  Pain in left hip  Localized edema  Pain in left ankle and joints of left foot     Problem List Patient Active  Problem List   Diagnosis Date Noted   Upper respiratory infection 01/15/2018   Postherpetic neuralgia 12/25/2016   Dysesthesia  12/25/2016   Polyneuropathy 12/25/2016   Exposure to influenza 04/01/2016   Dysphagia    Bronchiectasis without acute exacerbation ([place]) 08/30/2015   Bronchiectasis ([place]) 08/14/2015   Chronic rhinitis 08/14/2015   Avascular necrosis of femur head, left ([place]) 04/10/2014   OA (osteoarthritis) of hip 04/10/2014   Painful orthopaedic hardware ([place]) 10/17/2013    Sumner Boast, PT 08/14/2021, 5:52 PM  [place]. [place], [place], [address] Phone: [phone]   Fax:  [phone]  Name: Brooke Jimenez MRN: [id number] Date of Birth: [birth year]

## 2021-08-16 ENCOUNTER — Ambulatory Visit: Payer: Medicare Other | Admitting: Physical Therapy

## 2021-08-16 DIAGNOSIS — M6281 Muscle weakness (generalized): Secondary | ICD-10-CM

## 2021-08-16 DIAGNOSIS — G8929 Other chronic pain: Secondary | ICD-10-CM

## 2021-08-16 DIAGNOSIS — R262 Difficulty in walking, not elsewhere classified: Secondary | ICD-10-CM

## 2021-08-20 ENCOUNTER — Encounter: Payer: Self-pay | Admitting: Physical Therapy

## 2021-08-20 ENCOUNTER — Ambulatory Visit: Payer: Medicare Other | Admitting: Physical Therapy

## 2021-08-20 DIAGNOSIS — R600 Localized edema: Secondary | ICD-10-CM

## 2021-08-20 DIAGNOSIS — G8929 Other chronic pain: Secondary | ICD-10-CM

## 2021-08-20 DIAGNOSIS — M6281 Muscle weakness (generalized): Secondary | ICD-10-CM

## 2021-08-20 DIAGNOSIS — M25572 Pain in left ankle and joints of left foot: Secondary | ICD-10-CM

## 2021-08-20 DIAGNOSIS — M25552 Pain in left hip: Secondary | ICD-10-CM

## 2021-08-20 DIAGNOSIS — R262 Difficulty in walking, not elsewhere classified: Secondary | ICD-10-CM

## 2021-08-20 NOTE — Therapy (Signed)
[facility] [address]. [place], [place], [address] Phone: [phone]   Fax:  [phone]  Physical Therapy Treatment  Patient Details  Name: Brooke Jimenez MRN: [id number] Date of Birth: [birth year] Referring Provider (PT): Shon Baton   Encounter Date: 08/20/2021   PT End of Session - 08/20/21 1619     Visit Number 37    Date for PT Re-Evaluation 08/29/21    Authorization Type Medicare    PT Start Time 1615    PT Stop Time 1700    PT Time Calculation (min) 45 min    Activity Tolerance Patient tolerated treatment well    Behavior During Therapy [facility] for tasks assessed/performed             Past Medical History:  Diagnosis Date   Acne rosacea    Anxiety    Arthritis    arthritis in hip    Bronchiectasis ([facility])    Cataract    beginning   Complication of anesthesia    "really sore throat"   Corneal dystrophy    DDD (degenerative disc disease), cervical    and lower back   Diverticulosis    Dysphagia    chronic- please see ultrasound done 4/16 15 in EPIC    Dysrhythmia    sometimes patient has extra beats per patient    Esophageal dysmotility    Family history of adverse reaction to anesthesia    mother slow to wake up    GERD (gastroesophageal reflux disease)    under control   Glaucoma    "suspect"   Gout    one finger x1 attack   H/O hiatal hernia    Hearing loss    bilateral due to nerve loss. wears hearing aides   Heart murmur    no problem    History of chicken pox    History of melanoma    melanoma- 1986 and 1997    Hypothyroidism    Low back pain with sciatica    Measles    hx of   Mitral valve prolapse    Nocturia    Osteoporosis    Peripheral neuropathy    feet   PONV (postoperative nausea and vomiting)    PTSD (post-traumatic stress disorder) 2006   RSD (reflex sympathetic dystrophy) 1993   Spider veins    Urinary incontinence    occasional   Varicose veins     Past Surgical  History:  Procedure Laterality Date   ESOPHAGEAL MANOMETRY N/A 12/17/2015   Procedure: ESOPHAGEAL MANOMETRY (EM);  Surgeon: Napoleon Form, MD;  [facility];  Service: Endoscopy;  Laterality: N/A;   HARDWARE REMOVAL Left 10/17/2013   Procedure: HARDWARE REMOVAL LEFT HIP;  Surgeon: Loanne Drilling, MD;  [facility];  Service: Orthopedics;  Laterality: Left;   HIP FRACTURE SURGERY Left 2006   3 screws   INSERTION OF MESH  02/08/2014   Procedure: INSERTION OF MESH;  Surgeon: Avel Peace, MD;  [facility];  Service: General;;   melanoma surgery   1986, 1997   PARATHYROIDECTOMY  04/02/15   [place] IMPEDANCE STUDY N/A 12/17/2015   Procedure: [place] IMPEDANCE STUDY;  Surgeon: Napoleon Form, MD;  [facility];  Service: Endoscopy;  Laterality: N/A;   TOTAL HIP ARTHROPLASTY Left 04/10/2014   Procedure: LEFT TOTAL HIP ARTHROPLASTY ANTERIOR APPROACH;  Surgeon: Loanne Drilling, MD;  [facility];  Service: Orthopedics;  Laterality: Left;   UMBILICAL HERNIA REPAIR N/A  02/08/2014   Procedure: HERNIA REPAIR UMBILICAL ADULT WITH MESH;  Surgeon: Avel Peace, MD;  [facility];  Service: General;  Laterality: N/A;    There were no vitals filed for this visit.   Subjective Assessment - 08/20/21 1620     Subjective I did very well after the last session, however I think I over did it this weekend , I am hurting a little more in the back and the right hip    Currently in Pain? Yes    Pain Score 5     Pain Location Back    Pain Descriptors / Indicators Aching;Sore    Aggravating Factors  activity                [facility] PT Assessment - 08/20/21 0001       Circumferential Edema   Circumferential - Left  43 cm                           [facility] Adult PT Treatment/Exercise - 08/20/21 0001       Ambulation/Gait   Gait Comments did a 3 minute walk and did 600 feet, BORG exertion scale 4 RPE      Lumbar Exercises: Aerobic   Nustep L 6       Lumbar Exercises: Machines for Strengthening   Leg Press 20# 2x10    Other Lumbar Machine Exercise seated row 20# 2x10, lats 20# 2x10    Other Lumbar Machine Exercise 5# straight arm pulls                       PT Short Term Goals - 03/21/21 1005       PT SHORT TERM GOAL #1   Title independent with HEP    Status Achieved               PT Long Term Goals - 08/07/21 1322       PT LONG TERM GOAL #1   Title go up and down stairs step over step consistently    Status Partially Met      PT LONG TERM GOAL #2   Title return to independent gym program    Status Partially Met                   Plan - 08/20/21 1743     Clinical Impression Statement Had her walk 3 minutes without stopping got to 600 feet, RPE was a 4-5.  i did circumferential measurements with her and she has decreased the edema by 1.5 cm in the last 2 months, still about 1.5 cm larger than the other side.  She is going to do a baseline for the pulmonary rehab on Friday    PT Next Visit Plan continue to increase as she tolerates and look to have her move to on her own in the next month or so    Consulted and Agree with Plan of Care Patient             Patient will benefit from skilled therapeutic intervention in order to improve the following deficits and impairments:  Difficulty walking, Abnormal gait, Cardiopulmonary status limiting activity, Decreased endurance, Pain, Decreased activity tolerance, Decreased balance, Impaired flexibility, Decreased strength, Decreased mobility  Visit Diagnosis: Muscle weakness (generalized)  Difficulty in walking, not elsewhere classified  Chronic bilateral low back pain without sciatica  Pain in left hip  Localized edema  Pain  in left ankle and joints of left foot     Problem List Patient Active Problem List   Diagnosis Date Noted   Upper respiratory infection 01/15/2018   Postherpetic neuralgia 12/25/2016   Dysesthesia  12/25/2016   Polyneuropathy 12/25/2016   Exposure to influenza 04/01/2016   Dysphagia    Bronchiectasis without acute exacerbation ([facility]) 08/30/2015   Bronchiectasis ([facility]) 08/14/2015   Chronic rhinitis 08/14/2015   Avascular necrosis of femur head, left ([facility]) 04/10/2014   OA (osteoarthritis) of hip 04/10/2014   Painful orthopaedic hardware ([facility]) 10/17/2013    Jearld Lesch, PT 08/20/2021, 5:46 PM  [facility] [address] [place], [place], [address] Phone: [phone]   Fax:  [phone]  Name: Brooke Jimenez MRN: [id number] Date of Birth: [birth year]

## 2021-08-22 ENCOUNTER — Ambulatory Visit: Payer: Medicare Other | Admitting: Physical Therapy

## 2021-08-22 ENCOUNTER — Encounter: Payer: Self-pay | Admitting: Physical Therapy

## 2021-08-22 VITALS — BP 164/86 | HR 86

## 2021-08-22 DIAGNOSIS — M25572 Pain in left ankle and joints of left foot: Secondary | ICD-10-CM

## 2021-08-22 DIAGNOSIS — R600 Localized edema: Secondary | ICD-10-CM

## 2021-08-22 DIAGNOSIS — M545 Low back pain, unspecified: Secondary | ICD-10-CM

## 2021-08-22 DIAGNOSIS — R262 Difficulty in walking, not elsewhere classified: Secondary | ICD-10-CM

## 2021-08-22 DIAGNOSIS — M25552 Pain in left hip: Secondary | ICD-10-CM

## 2021-08-22 DIAGNOSIS — M6281 Muscle weakness (generalized): Secondary | ICD-10-CM | POA: Diagnosis not present

## 2021-08-22 NOTE — Therapy (Signed)
[place] [place], [place], [address] Phone: [phone]   Fax:  [phone]  Physical Therapy Treatment  Patient Details  Name: Brooke Jimenez MRN: [id number] Date of Birth: 09-27-1945 Referring Provider (PT): Rolena Infante   Encounter Date: 08/22/2021   PT End of Session - 08/22/21 1808     Visit Number 93    Date for PT Re-Evaluation 08/29/21    Authorization Type Medicare    PT Start Time 1800    PT Stop Time 1850    PT Time Calculation (min) 50 min    Activity Tolerance Patient tolerated treatment well    Behavior During Therapy [facility] for tasks assessed/performed             Past Medical History:  Diagnosis Date   Acne rosacea    Anxiety    Arthritis    arthritis in hip    Bronchiectasis ([place])    Cataract    beginning   Complication of anesthesia    "really sore throat"   Corneal dystrophy    DDD (degenerative disc disease), cervical    and lower back   Diverticulosis    Dysphagia    chronic- please see ultrasound done 4/16 15 in EPIC    Dysrhythmia    sometimes patient has extra beats per patient    Esophageal dysmotility    Family history of adverse reaction to anesthesia    mother slow to wake up    GERD (gastroesophageal reflux disease)    under control   Glaucoma    "suspect"   Gout    one finger x1 attack   H/O hiatal hernia    Hearing loss    bilateral due to nerve loss. wears hearing aides   Heart murmur    no problem    History of chicken pox    History of melanoma    melanoma- 1986 and 1997    Hypothyroidism    Low back pain with sciatica    Measles    hx of   Mitral valve prolapse    Nocturia    Osteoporosis    Peripheral neuropathy    feet   PONV (postoperative nausea and vomiting)    PTSD (post-traumatic stress disorder) 2006   RSD (reflex sympathetic dystrophy) 1993   Spider veins    Urinary incontinence    occasional   Varicose veins     Past Surgical  History:  Procedure Laterality Date   ESOPHAGEAL MANOMETRY N/A 12/17/2015   Procedure: ESOPHAGEAL MANOMETRY (EM);  Surgeon: Mauri Pole, MD;  [facility];  Service: Endoscopy;  Laterality: N/A;   HARDWARE REMOVAL Left 10/17/2013   Procedure: HARDWARE REMOVAL LEFT HIP;  Surgeon: Gearlean Alf, MD;  Location: [facility];  Service: Orthopedics;  Laterality: Left;   HIP FRACTURE SURGERY Left 2006   3 screws   INSERTION OF MESH  02/08/2014   Procedure: INSERTION OF MESH;  Surgeon: Jackolyn Confer, MD;  [facility]: [facility];  Service: General;;   melanoma surgery   1986, 1997   PARATHYROIDECTOMY  04/02/15   [place] IMPEDANCE STUDY N/A 12/17/2015   Procedure: [place] IMPEDANCE STUDY;  Surgeon: Mauri Pole, MD;  [facility];  Service: Endoscopy;  Laterality: N/A;   TOTAL HIP ARTHROPLASTY Left 04/10/2014   Procedure: LEFT TOTAL HIP ARTHROPLASTY ANTERIOR APPROACH;  Surgeon: Gearlean Alf, MD;  [facility];  Service: Orthopedics;  Laterality: Left;   UMBILICAL HERNIA REPAIR N/A  02/08/2014   Procedure: HERNIA REPAIR UMBILICAL ADULT WITH MESH;  Surgeon: Jackolyn Confer, MD;  [facility];  Service: General;  Laterality: N/A;    Vitals:   08/22/21 1841  BP: (!) 164/86  Pulse: 86  SpO2: 98%     Subjective Assessment - 08/22/21 1841     Subjective Patient has a lot of quesitons regarding her continuation of PT and starting the pulmonary rehab, she has fear of stopping PT as she has the neck, back and leg pain issues    Currently in Pain? Yes    Pain Score 4     Pain Location Back                               [facility] Adult PT Treatment/Exercise - 08/22/21 0001       Ambulation/Gait   Gait Comments did a 3 minute walk and did 600 feet, BORG exertion scale 6 RPE      Lumbar Exercises: Aerobic   Nustep L 6 18mn                       PT Short Term Goals - 03/21/21 1005       PT SHORT TERM GOAL #1   Title independent with HEP     Status Achieved               PT Long Term Goals - 08/22/21 1850       PT LONG TERM GOAL #1   Title go up and down stairs step over step consistently    Status Achieved      PT LONG TERM GOAL #2   Title return to independent gym program    Status Partially Met      PT LONG TERM GOAL #3   Title walk without device with minimal deviation    Status Partially Met                   Plan - 08/22/21 1850     Clinical Impression Statement Patient was able to do the 600 feet in 3 minutes RPE was 6 today, a little more SOB.  She has a trip that she would like to go on next fall, hsa some concerns about her ability to walk and sight see.  Again i encouraged her with the pulmonary rehab to keep up with it to advance her function, we did the paperwork for the class and I do think that ther are a few things that she can change that would make her more functional with her ability to do more    PT Next Visit Plan continue to increase as she tolerates and look to have her move to on her own in the next month or so    Consulted and Agree with Plan of Care Patient             Patient will benefit from skilled therapeutic intervention in order to improve the following deficits and impairments:  Difficulty walking, Abnormal gait, Cardiopulmonary status limiting activity, Decreased endurance, Pain, Decreased activity tolerance, Decreased balance, Impaired flexibility, Decreased strength, Decreased mobility  Visit Diagnosis: Muscle weakness (generalized)  Difficulty in walking, not elsewhere classified  Chronic bilateral low back pain without sciatica  Pain in left hip  Localized edema  Pain in left ankle and joints of left foot     Problem List Patient Active Problem List  Diagnosis Date Noted   Upper respiratory infection 01/15/2018   Postherpetic neuralgia 12/25/2016   Dysesthesia 12/25/2016   Polyneuropathy 12/25/2016   Exposure to influenza 04/01/2016    Dysphagia    Bronchiectasis without acute exacerbation ([place]) 08/30/2015   Bronchiectasis ([place]) 08/14/2015   Chronic rhinitis 08/14/2015   Avascular necrosis of femur head, left ([place]) 04/10/2014   OA (osteoarthritis) of hip 04/10/2014   Painful orthopaedic hardware ([place]) 10/17/2013    Sumner Boast, PT 08/22/2021, 6:54 PM  [place]. [place], [place], [address] Phone: [phone]   Fax:  [phone]  Name: Brooke Jimenez MRN: [id number] Date of Birth: Mar 20, 1945

## 2021-08-28 ENCOUNTER — Encounter: Payer: Self-pay | Admitting: Neurology

## 2021-08-28 ENCOUNTER — Telehealth: Payer: Self-pay | Admitting: Neurology

## 2021-08-28 ENCOUNTER — Ambulatory Visit (INDEPENDENT_AMBULATORY_CARE_PROVIDER_SITE_OTHER): Payer: Medicare Other | Admitting: Neurology

## 2021-08-28 VITALS — BP 165/92 | HR 84 | Ht 63.5 in | Wt 182.0 lb

## 2021-08-28 DIAGNOSIS — R270 Ataxia, unspecified: Secondary | ICD-10-CM | POA: Diagnosis not present

## 2021-08-28 DIAGNOSIS — R894 Abnormal immunological findings in specimens from other organs, systems and tissues: Secondary | ICD-10-CM

## 2021-08-28 DIAGNOSIS — G629 Polyneuropathy, unspecified: Secondary | ICD-10-CM | POA: Diagnosis not present

## 2021-08-28 DIAGNOSIS — B0229 Other postherpetic nervous system involvement: Secondary | ICD-10-CM

## 2021-08-28 DIAGNOSIS — H04123 Dry eye syndrome of bilateral lacrimal glands: Secondary | ICD-10-CM

## 2021-08-28 DIAGNOSIS — R208 Other disturbances of skin sensation: Secondary | ICD-10-CM | POA: Diagnosis not present

## 2021-08-28 DIAGNOSIS — R200 Anesthesia of skin: Secondary | ICD-10-CM | POA: Diagnosis not present

## 2021-08-28 DIAGNOSIS — R2689 Other abnormalities of gait and mobility: Secondary | ICD-10-CM

## 2021-08-28 NOTE — Progress Notes (Signed)
[facility]  PATIENT: Brooke Jimenez DOB: 19-Jun-1945  REFERRING DOCTOR OR PCP:  Adline Mango, MD SOURCE: Patient and notes from Dr. [name]  _________________________________   HISTORICAL  CHIEF COMPLAINT:  Chief Complaint  Patient presents with   Follow-up    Rm 2, alone. Pt fell Thursday (05/02/21) after last OV and injured her L leg and toe. Pt reports not stepping up high enough.     HISTORY OF PRESENT ILLNESS:  Brooke Jimenez is a 76 y.o. woman post herpetic neuralgia pain and polyneuropathy.  UPDATE 08/28/2021 She fell on a curb when she did not lift the leg high enough.     She lost one of her toenails.     She had a lot of swelling in her left leg and had a hematoma below her knee.  She went to [facility] and blood was removed.     She did PT.   She developed cellulitis and started clindamycin.    She feels the left leg    She also developed cellulitis associated with hte toenail removal and needed 2 different antibiotics.      Currently, her biggest complaint is numbness in her feet associated with dysesthetic pain.  She first had numbness in her feet more than a decade ago and she feels that has slowly progressed.     We did NCV/EMG in 2018 and she had no evidence of polyneuropathy.      She reports her mouth is often dry and she has dry eyes.   Her SSA was elevated and we referred to rheumatology (elevated ssa, neuropathy and dry eyes/mouth concern of Sjogren's).   Symptoms were not felt bad enough to evaluate further.  At times, her feet turn purple but are not cold.   No clear cut Raynaud's symptoms.    She has edema at ankles        She had seen vascular surgery (referred by podiatry) and she reports being told it was not a vascular.   She was diagnosed with RSD in hr right foot many years ago.  She saw Podiatry in 2021 and may need bunionectomy.     Several  different medications were tried for her dysesthesias.   Tylenol at 4000 mg / day caused LFT  elevation.   She started with gabapentin 200 mg po bid and then 200-100-200 and eventually up to 1500 mg daily. The high dose made her drowsy.  I last  her in 2018 and adjusted the gabapentin dose and had he try topical lidocaine.     She is still experiencing a squeezing sensation in the left flank and ribs.      She has seen GI and no GI source of this type of pain was identified.   The squeezing sensation comes in waves or if she turns over in bed.   Post herpetic neuralgia She developed shingles in 2018.   Around 10/13/2016, she had the onset of a left flank rash going towards the groin that was associated with an intense burning pain.  The pain felt deeper inside of her abdomen and at first diverticulitis was considered (she has diverticulosis) but CT scan did not verify.    She does still have some deeper pain but this is better.    The shingles pain has improved but is still intense.     She has sacral insufficiency fracture.    Before that she was diagnosed wih piriformis syndrome and did PT without benefit.  She is otherwise fairly healthy except for bronchiectasis and history of left hip fracture with painful hardware removal a few years later.      NCV/EMG in 2018 showed: 1.    There was no electrophysiologic evidence of a sensory, motor or autonomic polyneuropathy. 2.     There was no significant radiculopathy.   A minimal S1 radiculopathy on the right cannot be ruled out.  She has bronchiectasis and sees a specialist at [facility].   She has low IgM (we did SPEP in 2018 and [facility] confirmed 2022).    There was no paraprotein when checked in 2018.  She sees an Engineer, structural at [facility].      REVIEW OF SYSTEMS: Constitutional: No fevers, chills, sweats, or change in appetite.   Insomnia Eyes: No visual changes, double vision, eye pain Ear, nose and throat: No hearing loss, ear pain, nasal congestion, sore throat Cardiovascular: No chest pain, palpitations Respiratory:  No shortness of breath at  rest or with exertion.   No wheezes GastrointestinaI: No nausea, vomiting, diarrhea, abdominal pain, fecal incontinence Genitourinary:  No dysuria, urinary retention or frequency.  No nocturia. Musculoskeletal:  No neck pain, back pain Integumentary: No rash, pruritus, skin lesions Neurological: as above Psychiatric: No depression at this time.  No anxiety Endocrine: No palpitations, diaphoresis, change in appetite, change in weigh or increased thirst Hematologic/Lymphatic:  No anemia, purpura, petechiae. Allergic/Immunologic: No itchy/runny eyes, nasal congestion, recent allergic reactions, rashes  ALLERGIES: Allergies  Allergen Reactions   Codeine Nausea And Vomiting and Other (See Comments)    DILAUDID AND TRAMADOL ARE OKAY.  Vomiting DILAUDID AND TRAMADOL ARE OKAY.  Vomiting DILAUDID AND TRAMADOL ARE OKAY.     Levofloxacin Nausea Only, Nausea And Vomiting and Other (See Comments)    Vomiting Vomiting    Levaquin [Levofloxacin In D5w] Nausea And Vomiting   Neosporin  [Neomycin-Bacitracin Zn-Polymyx]     Other reaction(s): Other (See Comments) Blisters, Erythema   Adhesive [Tape] Rash   Epinephrine Nausea Only and Other (See Comments)    Heart pounds very hard Heart pounds very hard Heart pounds very hard Heart pounds very hard Heart pounds very hard    Erythromycin Other (See Comments), Nausea Only and Nausea And Vomiting    Abdominal pain  Abdominal pain  Abdominal pain  Abdominal pain    Guaifenesin Nausea Only   Minocycline Dermatitis    Skin discoloration on ankles per pt Skin discoloration on ankles per pt, but has had it since   Nickel Rash   Nsaids     Stomach aches   Sulfa Antibiotics Rash    HOME MEDICATIONS:  Current Outpatient Medications:    calcium carbonate (TUMS - DOSED IN MG ELEMENTAL CALCIUM) 500 MG chewable tablet, Chew 3 tablets by mouth daily as needed for indigestion or heartburn. , Disp: , Rfl:    D2000 ULTRA STRENGTH 50 MCG (2000 UT)  CAPS, Take 2 capsules by mouth daily., Disp: , Rfl:    doxycycline (VIBRA-TABS) 100 MG tablet, Take 1 tablet (100 mg total) by mouth 2 (two) times daily. (Patient taking differently: Take 100 mg by mouth as needed (for rosacea flare ups).), Disp: 20 tablet, Rfl: 0   levothyroxine (SYNTHROID, LEVOTHROID) 75 MCG tablet, Take 75 mcg by mouth daily before breakfast., Disp: , Rfl:    Multiple Vitamin (MULTIVITAMIN WITH MINERALS) TABS tablet, Take 1 tablet by mouth daily., Disp: , Rfl:    sodium chloride (MURO 128) 5 % ophthalmic ointment, Muro 128 5 % eye  ointment, Disp: , Rfl:    clindamycin (CLEOCIN) 150 MG capsule, Take 600 mg by mouth. One hour before dental appointments, Disp: , Rfl:   PAST MEDICAL HISTORY: Past Medical History:  Diagnosis Date   Acne rosacea    Anxiety    Arthritis    arthritis in hip    Bronchiectasis ([place])    Cataract    beginning   Complication of anesthesia    "really sore throat"   Corneal dystrophy    DDD (degenerative disc disease), cervical    and lower back   Diverticulosis    Dysphagia    chronic- please see ultrasound done 4/16 15 in [facility]    Dysrhythmia    sometimes patient has extra beats per patient    Esophageal dysmotility    Family history of adverse reaction to anesthesia    mother slow to wake up    GERD (gastroesophageal reflux disease)    under control   Glaucoma    "suspect"   Gout    one finger x1 attack   H/O hiatal hernia    Hearing loss    bilateral due to nerve loss. wears hearing aides   Heart murmur    no problem    History of chicken pox    History of melanoma    melanoma- 1986 and 1997    Hypothyroidism    Low back pain with sciatica    Measles    hx of   Mitral valve prolapse    Nocturia    Osteoporosis    Peripheral neuropathy    feet   PONV (postoperative nausea and vomiting)    PTSD (post-traumatic stress disorder) 2006   RSD (reflex sympathetic dystrophy) 1993   Spider veins    Urinary incontinence     occasional   Varicose veins     PAST SURGICAL HISTORY: Past Surgical History:  Procedure Laterality Date   ESOPHAGEAL MANOMETRY N/A 12/17/2015   Procedure: ESOPHAGEAL MANOMETRY (EM);  Surgeon: Mauri Pole, MD;  [facility];  Service: Endoscopy;  Laterality: N/A;   HARDWARE REMOVAL Left 10/17/2013   Procedure: HARDWARE REMOVAL LEFT HIP;  Surgeon: Gearlean Alf, MD;  [facility];  Service: Orthopedics;  Laterality: Left;   HIP FRACTURE SURGERY Left 2006   3 screws   INSERTION OF MESH  02/08/2014   Procedure: INSERTION OF MESH;  Surgeon: Jackolyn Confer, MD;  [facility];  Service: General;;   melanoma surgery   1986, 1997   PARATHYROIDECTOMY  04/02/15   [place] IMPEDANCE STUDY N/A 12/17/2015   Procedure: [place] IMPEDANCE STUDY;  Surgeon: Mauri Pole, MD;  [facility];  Service: Endoscopy;  Laterality: N/A;   TOTAL HIP ARTHROPLASTY Left 04/10/2014   Procedure: LEFT TOTAL HIP ARTHROPLASTY ANTERIOR APPROACH;  Surgeon: Gearlean Alf, MD;  [facility];  Service: Orthopedics;  Laterality: Left;   UMBILICAL HERNIA REPAIR N/A 02/08/2014   Procedure: HERNIA REPAIR UMBILICAL ADULT WITH MESH;  Surgeon: Jackolyn Confer, MD;  [facility];  Service: General;  Laterality: N/A;    FAMILY HISTORY: Family History  Problem Relation Age of Onset   Cancer Mother    Hypothyroidism Mother    Congestive Heart Failure Mother    Hypertension Mother    Heart failure Father     SOCIAL HISTORY:  Social History   Socioeconomic History   Marital status: Married    Spouse name: Not on file   Number of children: Not on file  Years of education: Not on file   Highest education level: Not on file  Occupational History   Not on file  Tobacco Use   Smoking status: Never   Smokeless tobacco: Never  Substance and Sexual Activity   Alcohol use: No    Alcohol/week: 0.0 standard drinks of alcohol   Drug use: No   Sexual activity: Not on file  Other Topics  Concern   Not on file  Social History Narrative   Not on file   Social Determinants of Health   Financial Resource Strain: Not on file  Food Insecurity: Not on file  Transportation Needs: Not on file  Physical Activity: Not on file  Stress: Not on file  Social Connections: Not on file  Intimate Partner Violence: Not on file     PHYSICAL EXAM  Vitals:   08/28/21 1043  BP: (!) 165/92  Pulse: 84  Weight: 182 lb (82.6 kg)  Height: 5' 3.5" (1.613 m)     Body mass index is 31.73 kg/m.   General: The patient is well-developed and well-nourished and in no acute distress   Neck: The neck is supple, no carotid bruits are noted.  The neck is nontender.  Reduced ROM  Cardiovascular: The heart has a regular rate and rhythm with a normal S1 and S2. There were no murmurs, gallops or rubs. Lungs are clear to auscultation.  Skin/legs: No truncal rash.   She has edema, left > right at ankles, indurated area medial ankle on left.  Pulses are present in feet.      Musculoskeletal:  Back is nontender  Neurologic Exam  Mental status: The patient is alert and oriented x 3 at the time of the examination. The patient has apparent normal recent and remote memory, with an apparently normal attention span and concentration ability.   Speech is normal.  Cranial nerves: Extraocular movements are full. Facial strength and sensation are normal.  . No obvious hearing deficits are noted.  Motor:  Muscle bulk is normal.   Tone is normal. Strength is  5 / 5 in all 4 extremities.   Sensory: She has reduced sensation to vibration at the ankles and more severe reduction of vibration sensation in the toes.  Sensation to touch and pain are fairly normal in the feet and arms, possibly with mild hyperpathia at the toes.   Currently she just has slight allodynia over the skin of the left flank that had been involved with shingles in 2018.  This is not painful.  Coordination: Cerebellar testing reveals good  finger-nose-finger and heel-to-shin bilaterally.  Gait and station: Station is normal.   Gait has reduced stride.  Tandem gait is poor.   Romberg is negative.   Reflexes: Deep tendon reflexes are symmetric and normal bilaterally, including ankles.   Plantar responses are flexor.    DIAGNOSTIC DATA (LABS, IMAGING, TESTING) - I reviewed patient records, labs, notes, testing and imaging myself where available.  Lab Results  Component Value Date   WBC 6.3 10/13/2016   HGB 14.1 10/13/2016   HCT 41.2 10/13/2016   MCV 88.2 10/13/2016   PLT 219 10/13/2016      Component Value Date/Time   NA 136 10/13/2016 1307   K 3.4 (L) 10/13/2016 1307   CL 102 10/13/2016 1307   CO2 25 10/13/2016 1307   GLUCOSE 107 (H) 10/13/2016 1307   BUN 7 10/13/2016 1307   CREATININE 0.85 10/13/2016 1307   CALCIUM 9.0 10/13/2016 1307   PROT 6.9  04/29/2021 1147   ALBUMIN 4.4 10/13/2016 1307   AST 33 10/13/2016 1307   ALT 26 10/13/2016 1307   ALKPHOS 40 10/13/2016 1307   BILITOT 0.6 10/13/2016 1307   GFRNONAA >60 10/13/2016 1307   GFRAA >60 10/13/2016 1307       ASSESSMENT AND PLAN  Ataxia - Plan: MR CERVICAL SPINE WO CONTRAST, MR BRAIN WO CONTRAST  Dysesthesia - Plan: MR CERVICAL SPINE WO CONTRAST  Polyneuropathy - Plan: Sjogren's syndrome antibods(ssa + ssb)  Numbness - Plan: Sjogren's syndrome antibods(ssa + ssb)  Balance disorder  Postherpetic neuralgia  Serologic abnormality  Dry eyes - Plan: Sjogren's syndrome antibods(ssa + ssb)         Suspected multifactorial gait issues - ortho/spie issues, age, mild numbness/mild polyneuropathy. We discussed trying Alpha lipoic acid 600 mg po bid.  If neuropathic pain worsens, consider lamotrigine.  She would prefer not to do a prescription medication at this time. Due to reduced balance and reduce stride, we will check MRI of the brain and cervical spine to rule out normal pressure hydrocephalus and myelopathy/spinal stenosis. Rtc 6 months.   Call sooner if new or worsening neurologic symptoms.  40-minute office visit with the majority of the time spent face-to-face for history and physical, discussion/counseling and decision-making.  Additional time with record review and documentation.   Sanna Porcaro A. Felecia Shelling, MD, [facility] 10/31/5881, 2:54 PM Certified in Neurology, Clinical Neurophysiology, Sleep Medicine, Pain Medicine and Neuroimaging  [facility] [address] [place], [place] [address] [phone]

## 2021-08-28 NOTE — Telephone Encounter (Signed)
medicare/BCBS supp NPR sent to GI

## 2021-08-29 LAB — SJOGREN'S SYNDROME ANTIBODS(SSA + SSB)
ENA SSA (RO) Ab: >8 AI — ABNORMAL HIGH (ref 0.0–0.9)
ENA SSB (LA) Ab: <0.2 AI (ref 0.0–0.9)

## 2021-09-03 ENCOUNTER — Ambulatory Visit: Payer: Medicare Other | Attending: Internal Medicine | Admitting: Physical Therapy

## 2021-09-03 ENCOUNTER — Encounter: Payer: Self-pay | Admitting: Physical Therapy

## 2021-09-03 DIAGNOSIS — M545 Low back pain, unspecified: Secondary | ICD-10-CM | POA: Insufficient documentation

## 2021-09-03 DIAGNOSIS — R262 Difficulty in walking, not elsewhere classified: Secondary | ICD-10-CM | POA: Diagnosis present

## 2021-09-03 DIAGNOSIS — M542 Cervicalgia: Secondary | ICD-10-CM | POA: Diagnosis present

## 2021-09-03 DIAGNOSIS — G8929 Other chronic pain: Secondary | ICD-10-CM | POA: Diagnosis present

## 2021-09-03 DIAGNOSIS — M25572 Pain in left ankle and joints of left foot: Secondary | ICD-10-CM | POA: Insufficient documentation

## 2021-09-03 DIAGNOSIS — M6281 Muscle weakness (generalized): Secondary | ICD-10-CM | POA: Insufficient documentation

## 2021-09-03 DIAGNOSIS — M25552 Pain in left hip: Secondary | ICD-10-CM | POA: Insufficient documentation

## 2021-09-03 DIAGNOSIS — R600 Localized edema: Secondary | ICD-10-CM | POA: Insufficient documentation

## 2021-09-03 NOTE — Therapy (Signed)
[place] [place], [place], [address] Phone: [phone]   Fax:  [phone]  Physical Therapy Treatment  Patient Details  Name: Brooke Jimenez MRN: [id number] Date of Birth: 1945-03-30 Referring Provider (PT): Rolena Infante   Encounter Date: 09/03/2021   PT End of Session - 09/03/21 1534     Visit Number 26    Date for PT Re-Evaluation 08/29/21    Authorization Type Medicare    PT Start Time 1529    PT Stop Time 1614    PT Time Calculation (min) 45 min    Activity Tolerance Patient tolerated treatment well    Behavior During Therapy [facility] for tasks assessed/performed             Past Medical History:  Diagnosis Date   Acne rosacea    Anxiety    Arthritis    arthritis in hip    Bronchiectasis ([place])    Cataract    beginning   Complication of anesthesia    "really sore throat"   Corneal dystrophy    DDD (degenerative disc disease), cervical    and lower back   Diverticulosis    Dysphagia    chronic- please see ultrasound done 4/16 15 in EPIC    Dysrhythmia    sometimes patient has extra beats per patient    Esophageal dysmotility    Family history of adverse reaction to anesthesia    mother slow to wake up    GERD (gastroesophageal reflux disease)    under control   Glaucoma    "suspect"   Gout    one finger x1 attack   H/O hiatal hernia    Hearing loss    bilateral due to nerve loss. wears hearing aides   Heart murmur    no problem    History of chicken pox    History of melanoma    melanoma- 1986 and 1997    Hypothyroidism    Low back pain with sciatica    Measles    hx of   Mitral valve prolapse    Nocturia    Osteoporosis    Peripheral neuropathy    feet   PONV (postoperative nausea and vomiting)    PTSD (post-traumatic stress disorder) 2006   RSD (reflex sympathetic dystrophy) 1993   Spider veins    Urinary incontinence    occasional   Varicose veins     Past Surgical  History:  Procedure Laterality Date   ESOPHAGEAL MANOMETRY N/A 12/17/2015   Procedure: ESOPHAGEAL MANOMETRY (EM);  Surgeon: Mauri Pole, MD;  [facility];  Service: Endoscopy;  Laterality: N/A;   HARDWARE REMOVAL Left 10/17/2013   Procedure: HARDWARE REMOVAL LEFT HIP;  Surgeon: Gearlean Alf, MD;  [facility];  Service: Orthopedics;  Laterality: Left;   HIP FRACTURE SURGERY Left 2006   3 screws   INSERTION OF MESH  02/08/2014   Procedure: INSERTION OF MESH;  Surgeon: Jackolyn Confer, MD;  [facility];  Service: General;;   melanoma surgery   1986, 1997   PARATHYROIDECTOMY  04/02/15   [place] IMPEDANCE STUDY N/A 12/17/2015   Procedure: [place] IMPEDANCE STUDY;  Surgeon: Mauri Pole, MD;  [facility];  Service: Endoscopy;  Laterality: N/A;   TOTAL HIP ARTHROPLASTY Left 04/10/2014   Procedure: LEFT TOTAL HIP ARTHROPLASTY ANTERIOR APPROACH;  Surgeon: Gearlean Alf, MD;  [facility];  Service: Orthopedics;  Laterality: Left;   UMBILICAL HERNIA REPAIR N/A  02/08/2014   Procedure: HERNIA REPAIR UMBILICAL ADULT WITH MESH;  Surgeon: Jackolyn Confer, MD;  [facility];  Service: General;  Laterality: N/A;    There were no vitals filed for this visit.   Subjective Assessment - 09/03/21 1534     Subjective Patient reports that she is haivng back pain, reports that she has been cleaning out a room, she did the test and has begun some of the pulmonary rehab    Currently in Pain? Yes    Pain Score 6     Pain Location Back    Pain Orientation Lower    Pain Descriptors / Indicators Sore;Tightness    Aggravating Factors  worse with activity, cleaning, putting on compression stockings                               [facility] Adult PT Treatment/Exercise - 09/03/21 0001       Lumbar Exercises: Stretches   Passive Hamstring Stretch Right;Left;4 reps;10 seconds    Single Knee to Chest Stretch Right;Left;3 reps;10 seconds    Piriformis  Stretch Right;Left;4 reps;20 seconds    Gastroc Stretch Right;Left;3 reps;20 seconds      Lumbar Exercises: Aerobic   Nustep L 6 56mn      Lumbar Exercises: Machines for Strengthening   Leg Press 20# 2x10    Other Lumbar Machine Exercise seated row 20# 2x10, lats 20# 2x10    Other Lumbar Machine Exercise 5# straight arm pulls      Lumbar Exercises: Supine   Other Supine Lumbar Exercises feet on ball K2C, trunk rotation, small bridges, isometrica abs                       PT Short Term Goals - 03/21/21 1005       PT SHORT TERM GOAL #1   Title independent with HEP    Status Achieved               PT Long Term Goals - 09/03/21 1536       PT LONG TERM GOAL #1   Title go up and down stairs step over step consistently    Status Achieved      PT LONG TERM GOAL #2   Title return to independent gym program    Status Partially Met      PT LONG TERM GOAL #3   Title walk without device with minimal deviation    Status Partially Met                   Plan - 09/03/21 1611     Clinical Impression Statement Patient is struggling today, reports that she has been cleaning out a room, reports that she spent a few days doing this and that she is really hurting in the low back and the hips, c/o tightness and soreness.  She was very tight much tighter than her normal with HS, gastroc and piriformis mms    PT Next Visit Plan continue to increase as she tolerates and look to have her move to on her own in the next month or so    Consulted and Agree with Plan of Care Patient             Patient will benefit from skilled therapeutic intervention in order to improve the following deficits and impairments:  Difficulty walking, Abnormal gait, Cardiopulmonary status limiting activity, Decreased endurance, Pain, Decreased  activity tolerance, Decreased balance, Impaired flexibility, Decreased strength, Decreased mobility  Visit Diagnosis: Muscle weakness  (generalized)  Difficulty in walking, not elsewhere classified  Chronic bilateral low back pain without sciatica  Pain in left hip  Localized edema  Pain in left ankle and joints of left foot  Cervicalgia     Problem List Patient Active Problem List   Diagnosis Date Noted   Upper respiratory infection 01/15/2018   Postherpetic neuralgia 12/25/2016   Dysesthesia 12/25/2016   Polyneuropathy 12/25/2016   Exposure to influenza 04/01/2016   Dysphagia    Bronchiectasis without acute exacerbation ([place]) 08/30/2015   Bronchiectasis ([place]) 08/14/2015   Chronic rhinitis 08/14/2015   Avascular necrosis of femur head, left ([place]) 04/10/2014   OA (osteoarthritis) of hip 04/10/2014   Painful orthopaedic hardware ([place]) 10/17/2013    Sumner Boast, PT 09/03/2021, 4:13 PM  [place]. [place], [place], [address] Phone: [phone]   Fax:  [phone]  Name: Brooke Jimenez MRN: [id number] Date of Birth: 09/01/1945

## 2021-09-05 ENCOUNTER — Ambulatory Visit: Payer: Medicare Other | Admitting: Physical Therapy

## 2021-09-05 ENCOUNTER — Encounter: Payer: Self-pay | Admitting: Physical Therapy

## 2021-09-05 DIAGNOSIS — G8929 Other chronic pain: Secondary | ICD-10-CM

## 2021-09-05 DIAGNOSIS — M25552 Pain in left hip: Secondary | ICD-10-CM

## 2021-09-05 DIAGNOSIS — M6281 Muscle weakness (generalized): Secondary | ICD-10-CM

## 2021-09-05 DIAGNOSIS — R262 Difficulty in walking, not elsewhere classified: Secondary | ICD-10-CM

## 2021-09-05 NOTE — Therapy (Signed)
[facility] [address] [place], [place], [address] Phone: [phone]   Fax:  [phone] Progress Note Reporting Period 07/30/21 to 09/05/21 for vistis 31-40  See note below for Objective Data and Assessment of Progress/Goals.     Physical Therapy Treatment  Patient Details  Name: Brooke Jimenez MRN: [id number] Date of Birth: 05/12/1945 Referring Provider (PT): Brooks   Encounter Date: 09/05/2021   PT End of Session - 09/05/21 1532     Visit Number 40    Date for PT Re-Evaluation 10/04/21    Authorization Type Medicare    PT Start Time 1528    PT Stop Time 1614    PT Time Calculation (min) 46 min    Activity Tolerance Patient tolerated treatment well    Behavior During Therapy [facility] for tasks assessed/performed             Past Medical History:  Diagnosis Date   Acne rosacea    Anxiety    Arthritis    arthritis in hip    Bronchiectasis ([facility])    Cataract    beginning   Complication of anesthesia    "really sore throat"   Corneal dystrophy    DDD (degenerative disc disease), cervical    and lower back   Diverticulosis    Dysphagia    chronic- please see ultrasound done 4/16 15 in EPIC    Dysrhythmia    sometimes patient has extra beats per patient    Esophageal dysmotility    Family history of adverse reaction to anesthesia    mother slow to wake up    GERD (gastroesophageal reflux disease)    under control   Glaucoma    "suspect"   Gout    one finger x1 attack   H/O hiatal hernia    Hearing loss    bilateral due to nerve loss. wears hearing aides   Heart murmur    no problem    History of chicken pox    History of melanoma    melanoma- 1986 and 1997    Hypothyroidism    Low back pain with sciatica    Measles    hx of   Mitral valve prolapse    Nocturia    Osteoporosis    Peripheral neuropathy    feet   PONV (postoperative nausea and vomiting)    PTSD (post-traumatic stress disorder)  2006   RSD (reflex sympathetic dystrophy) 1993   Spider veins    Urinary incontinence    occasional   Varicose veins     Past Surgical History:  Procedure Laterality Date   ESOPHAGEAL MANOMETRY N/A 12/17/2015   Procedure: ESOPHAGEAL MANOMETRY (EM);  Surgeon: Kavitha V Nandigam, MD;  [facility];  Service: Endoscopy;  Laterality: N/A;   HARDWARE REMOVAL Left 10/17/2013   Procedure: HARDWARE REMOVAL LEFT HIP;  Surgeon: Frank Aluisio V, MD;  [facility];  Service: Orthopedics;  Laterality: Left;   HIP FRACTURE SURGERY Left 2006   3 screws   INSERTION OF MESH  02/08/2014   Procedure: INSERTION OF MESH;  Surgeon: Todd Rosenbower, MD;  [facility];  Service: General;;   melanoma surgery   1986, 1997   PARATHYROIDECTOMY  04/02/15   [place] IMPEDANCE STUDY N/A 12/17/2015   Procedure: [place] IMPEDANCE STUDY;  Surgeon: Kavitha V Nandigam, MD;  [facility];  Service: Endoscopy;  Laterality: N/A;   TOTAL HIP ARTHROPLASTY Left 04/10/2014   Procedure: LEFT TOTAL HIP ARTHROPLASTY   ANTERIOR APPROACH;  Surgeon: Gearlean Alf, MD;  [facility];  Service: Orthopedics;  Laterality: Left;   UMBILICAL HERNIA REPAIR N/A 02/08/2014   Procedure: HERNIA REPAIR UMBILICAL ADULT WITH MESH;  Surgeon: Jackolyn Confer, MD;  [facility];  Service: General;  Laterality: N/A;    There were no vitals filed for this visit.   Subjective Assessment - 09/05/21 1532     Subjective Reports that she was pretty sore after the last visit but reports that she feels like it was good, a good workout and the soreness was not bad and she recovered    Currently in Pain? Yes    Pain Score 3     Pain Location Back    Pain Orientation Lower                               [facility] Adult PT Treatment/Exercise - 09/05/21 0001       Lumbar Exercises: Stretches   Passive Hamstring Stretch Right;Left;4 reps;10 seconds    Piriformis Stretch Right;Left;4 reps;20 seconds    Gastroc  Stretch Right;Left;3 reps;20 seconds      Lumbar Exercises: Aerobic   Nustep L 6 3mn      Lumbar Exercises: Machines for Strengthening   Leg Press 20# 2x10    Other Lumbar Machine Exercise seated row 20# 2x10, lats 20# 2x10    Other Lumbar Machine Exercise 5# straight arm pulls, black tband back extension 2x10      Lumbar Exercises: Supine   Other Supine Lumbar Exercises feet on ball K2C, trunk rotation, small bridges, isometrica abs                       PT Short Term Goals - 03/21/21 1005       PT SHORT TERM GOAL #1   Title independent with HEP    Status Achieved               PT Long Term Goals - 09/05/21 1534       PT LONG TERM GOAL #1   Title go up and down stairs step over step consistently    Status Achieved      PT LONG TERM GOAL #2   Title return to independent gym program    Status Partially Met      PT LONG TERM GOAL #3   Title walk without device with minimal deviation    Status Achieved      PT LONG TERM GOAL #4   Title report pain decreased 50%    Status Partially Met      PT LONG TERM GOAL #5   Title decrease TUG time to 13 seconds    Status Achieved                   Plan - 09/05/21 1612     Clinical Impression Statement Patient has had a lot of ups and downs with back pain the leg swelling and the cellulitis.  She overall is getting better but this past two weeks she was doing some cleaning and has flared up the back and the hips.  She is doing well and is progressing with function and strength and less pain, she still has a lot of issues with her breathing and the pain.  We have started talking about D/C from PT and what to do after [place]   PT Frequency 2x /  week    PT Duration 4 weeks    PT Treatment/Interventions ADLs/Self Care Home Management;Cryotherapy;Electrical Stimulation;Iontophoresis 4mg/ml Dexamethasone;Moist Heat;Gait training;Stair training;Functional mobility training;Therapeutic activities;Therapeutic  exercise;Balance training;Neuromuscular re-education;Manual techniques;Patient/family education    PT Next Visit Plan continue to increase as she tolerates and look to have her move to on her own in the next month    Consulted and Agree with Plan of Care Patient             Patient will benefit from skilled therapeutic intervention in order to improve the following deficits and impairments:  Difficulty walking, Abnormal gait, Cardiopulmonary status limiting activity, Decreased endurance, Pain, Decreased activity tolerance, Decreased balance, Impaired flexibility, Decreased strength, Decreased mobility  Visit Diagnosis: Muscle weakness (generalized)  Difficulty in walking, not elsewhere classified  Chronic bilateral low back pain without sciatica  Pain in left hip     Problem List Patient Active Problem List   Diagnosis Date Noted   Upper respiratory infection 01/15/2018   Postherpetic neuralgia 12/25/2016   Dysesthesia 12/25/2016   Polyneuropathy 12/25/2016   Exposure to influenza 04/01/2016   Dysphagia    Bronchiectasis without acute exacerbation ([facility]) 08/30/2015   Bronchiectasis ([facility]) 08/14/2015   Chronic rhinitis 08/14/2015   Avascular necrosis of femur head, left ([facility]CC) 04/10/2014   OA (osteoarthritis) of hip 04/10/2014   Painful orthopaedic hardware ([facility]) 10/17/2013    ALBRIGHT,MICHAEL W, PT 09/05/2021, 4:14 PM  [facility] [address] [place], [place], [address] Phone: [phone]   Fax:  [phone]  Name: Ayva Bernardini MRN: [id number] Date of Birth: 10/19/1945    

## 2021-09-10 ENCOUNTER — Encounter: Payer: Self-pay | Admitting: Physical Therapy

## 2021-09-10 ENCOUNTER — Ambulatory Visit: Payer: Medicare Other | Admitting: Physical Therapy

## 2021-09-10 DIAGNOSIS — R262 Difficulty in walking, not elsewhere classified: Secondary | ICD-10-CM

## 2021-09-10 DIAGNOSIS — M6281 Muscle weakness (generalized): Secondary | ICD-10-CM | POA: Diagnosis not present

## 2021-09-10 DIAGNOSIS — M545 Low back pain, unspecified: Secondary | ICD-10-CM

## 2021-09-10 DIAGNOSIS — M25572 Pain in left ankle and joints of left foot: Secondary | ICD-10-CM

## 2021-09-10 DIAGNOSIS — M25552 Pain in left hip: Secondary | ICD-10-CM

## 2021-09-10 DIAGNOSIS — M542 Cervicalgia: Secondary | ICD-10-CM

## 2021-09-10 DIAGNOSIS — R600 Localized edema: Secondary | ICD-10-CM

## 2021-09-10 NOTE — Therapy (Signed)
[facility]. [place], [place], [address] Phone: [phone]   Fax:  [phone]  Physical Therapy Treatment  Patient Details  Name: Brooke Jimenez MRN: [id number] Date of Birth: 29-Dec-1945 Referring Provider (PT): Rolena Infante   Encounter Date: 09/10/2021   PT End of Session - 09/10/21 1319     Visit Number 7    Date for PT Re-Evaluation 10/04/21    Authorization Type Medicare    PT Start Time 1314    PT Stop Time 1400    PT Time Calculation (min) 46 min    Activity Tolerance Patient tolerated treatment well    Behavior During Therapy [facility] for tasks assessed/performed             Past Medical History:  Diagnosis Date   Acne rosacea    Anxiety    Arthritis    arthritis in hip    Bronchiectasis ([place])    Cataract    beginning   Complication of anesthesia    "really sore throat"   Corneal dystrophy    DDD (degenerative disc disease), cervical    and lower back   Diverticulosis    Dysphagia    chronic- please see ultrasound done 4/16 15 in EP[facility]    Dysrhythmia    sometimes patient has extra beats per patient    Esophageal dysmotility    Family history of adverse reaction to anesthesia    mother slow to wake up    GERD (gastroesophageal reflux disease)    under control   Glaucoma    "suspect"   Gout    one finger x1 attack   H/O hiatal hernia    Hearing loss    bilateral due to nerve loss. wears hearing aides   Heart murmur    no problem    History of chicken pox    History of melanoma    melanoma- 1986 and 1997    Hypothyroidism    Low back pain with sciatica    Measles    hx of   Mitral valve prolapse    Nocturia    Osteoporosis    Peripheral neuropathy    feet   PONV (postoperative nausea and vomiting)    PTSD (post-traumatic stress disorder) 2006   RSD (reflex sympathetic dystrophy) 1993   Spider veins    Urinary incontinence    occasional   Varicose veins     Past Surgical  History:  Procedure Laterality Date   ESOPHAGEAL MANOMETRY N/A 12/17/2015   Procedure: ESOPHAGEAL MANOMETRY (EM);  Surgeon: Mauri Pole, MD;  [facility];  Service: Endoscopy;  Laterality: N/A;   HARDWARE REMOVAL Left 10/17/2013   Procedure: HARDWARE REMOVAL LEFT HIP;  Surgeon: Gearlean Alf, MD;  [facility];  Service: Orthopedics;  Laterality: Left;   HIP FRACTURE SURGERY Left 2006   3 screws   INSERTION OF MESH  02/08/2014   Procedure: INSERTION OF MESH;  Surgeon: Jackolyn Confer, MD;  [facility];  Service: General;;   melanoma surgery   1986, 1997   PARATHYROIDECTOMY  04/02/15   [place] IMPEDANCE STUDY N/A 12/17/2015   Procedure: [place] IMPEDANCE STUDY;  Surgeon: Mauri Pole, MD;  [facility];  Service: Endoscopy;  Laterality: N/A;   TOTAL HIP ARTHROPLASTY Left 04/10/2014   Procedure: LEFT TOTAL HIP ARTHROPLASTY ANTERIOR APPROACH;  Surgeon: Gearlean Alf, MD;  [facility];  Service: Orthopedics;  Laterality: Left;   UMBILICAL HERNIA REPAIR N/A  02/08/2014   Procedure: HERNIA REPAIR UMBILICAL ADULT WITH MESH;  Surgeon: Jackolyn Confer, MD;  [facility];  Service: General;  Laterality: N/A;    There were no vitals filed for this visit.   Subjective Assessment - 09/10/21 1319     Subjective Reports doing okay, she still with some back pain, reports does the exercises fine without any increase of pain, reports some difficulty with balance    Currently in Pain? Yes    Pain Score 4     Pain Location Back    Pain Orientation Lower                               [facility] Adult PT Treatment/Exercise - 09/10/21 0001       Ambulation/Gait   Gait Comments 300 feet non stop at a good pace      High Level Balance   High Level Balance Comments cone toe touches, higher toe touches, on airex toe touches beach ball volleyball solid surface and then on airex      Lumbar Exercises: Stretches   Passive Hamstring Stretch  Right;Left;4 reps;10 seconds      Lumbar Exercises: Aerobic   UBE (Upper Arm Bike) L 3 2 min fwd/2 mn backward    Recumbent Bike level 3 x 4 minutes      Lumbar Exercises: Supine   Other Supine Lumbar Exercises feet on ball K2C, trunk rotation, small bridges, isometrica abs                       PT Short Term Goals - 03/21/21 1005       PT SHORT TERM GOAL #1   Title independent with HEP    Status Achieved               PT Long Term Goals - 09/05/21 1534       PT LONG TERM GOAL #1   Title go up and down stairs step over step consistently    Status Achieved      PT LONG TERM GOAL #2   Title return to independent gym program    Status Partially Met      PT LONG TERM GOAL #3   Title walk without device with minimal deviation    Status Achieved      PT LONG TERM GOAL #4   Title report pain decreased 50%    Status Partially Met      PT LONG TERM GOAL #5   Title decrease TUG time to 13 seconds    Status Achieved                   Plan - 09/10/21 1351     Clinical Impression Statement Patient reports concerns about balance, I added some balance, she did have difficulty with the balance activities at at times needed to hold on to prevent LOB, she was short of breath with the higher level balance activities, still fighting the LBP and hip tightness    PT Next Visit Plan continue to increase as she tolerates and look to have her move to on her own in the next month    Consulted and Agree with Plan of Care Patient             Patient will benefit from skilled therapeutic intervention in order to improve the following deficits and impairments:  Difficulty walking, Abnormal gait, Cardiopulmonary  status limiting activity, Decreased endurance, Pain, Decreased activity tolerance, Decreased balance, Impaired flexibility, Decreased strength, Decreased mobility  Visit Diagnosis: Muscle weakness (generalized)  Difficulty in walking, not elsewhere  classified  Chronic bilateral low back pain without sciatica  Pain in left hip  Localized edema  Pain in left ankle and joints of left foot  Cervicalgia     Problem List Patient Active Problem List   Diagnosis Date Noted   Upper respiratory infection 01/15/2018   Postherpetic neuralgia 12/25/2016   Dysesthesia 12/25/2016   Polyneuropathy 12/25/2016   Exposure to influenza 04/01/2016   Dysphagia    Bronchiectasis without acute exacerbation ([place]) 08/30/2015   Bronchiectasis ([place]) 08/14/2015   Chronic rhinitis 08/14/2015   Avascular necrosis of femur head, left ([place]) 04/10/2014   OA (osteoarthritis) of hip 04/10/2014   Painful orthopaedic hardware ([place]) 10/17/2013    Sumner Boast, PT 09/10/2021, 1:54 PM  [place]. [place], [place], [address] Phone: [phone]   Fax:  [phone]  Name: Brooke Jimenez MRN: [id number] Date of Birth: 1945/11/29

## 2021-09-11 ENCOUNTER — Ambulatory Visit
Admission: RE | Admit: 2021-09-11 | Discharge: 2021-09-11 | Disposition: A | Discharge status: Home / Self Care | Payer: Medicare Other | Source: Ambulatory Visit | Attending: Neurology | Admitting: Neurology

## 2021-09-11 DIAGNOSIS — R208 Other disturbances of skin sensation: Secondary | ICD-10-CM

## 2021-09-11 DIAGNOSIS — R270 Ataxia, unspecified: Secondary | ICD-10-CM

## 2021-09-12 ENCOUNTER — Ambulatory Visit: Payer: Medicare Other | Admitting: Physical Therapy

## 2021-09-12 DIAGNOSIS — M6281 Muscle weakness (generalized): Secondary | ICD-10-CM

## 2021-09-12 DIAGNOSIS — R262 Difficulty in walking, not elsewhere classified: Secondary | ICD-10-CM

## 2021-09-12 NOTE — Therapy (Signed)
[place] [place], [place], [address] Phone: [phone]   Fax:  [phone]  Physical Therapy Treatment  Patient Details  Name: Brooke Jimenez MRN: [id number] Date of Birth: Jan 09, 1946 Referring Provider (PT): Rolena Infante   Encounter Date: 09/12/2021   PT End of Session - 09/12/21 6962     Visit Number 3    Date for PT Re-Evaluation 10/04/21    Authorization Type Medicare    PT Start Time 1530    PT Stop Time 1615    PT Time Calculation (min) 45 min             Past Medical History:  Diagnosis Date   Acne rosacea    Anxiety    Arthritis    arthritis in hip    Bronchiectasis ([place])    Cataract    beginning   Complication of anesthesia    "really sore throat"   Corneal dystrophy    DDD (degenerative disc disease), cervical    and lower back   Diverticulosis    Dysphagia    chronic- please see ultrasound done 4/16 15 in [facility]    Dysrhythmia    sometimes patient has extra beats per patient    Esophageal dysmotility    Family history of adverse reaction to anesthesia    mother slow to wake up    GERD (gastroesophageal reflux disease)    under control   Glaucoma    "suspect"   Gout    one finger x1 attack   H/O hiatal hernia    Hearing loss    bilateral due to nerve loss. wears hearing aides   Heart murmur    no problem    History of chicken pox    History of melanoma    melanoma- 1986 and 1997    Hypothyroidism    Low back pain with sciatica    Measles    hx of   Mitral valve prolapse    Nocturia    Osteoporosis    Peripheral neuropathy    feet   PONV (postoperative nausea and vomiting)    PTSD (post-traumatic stress disorder) 2006   RSD (reflex sympathetic dystrophy) 1993   Spider veins    Urinary incontinence    occasional   Varicose veins     Past Surgical History:  Procedure Laterality Date   ESOPHAGEAL MANOMETRY N/A 12/17/2015   Procedure: ESOPHAGEAL MANOMETRY (EM);   Surgeon: Mauri Pole, MD;  Location: [facility];  Service: Endoscopy;  Laterality: N/A;   HARDWARE REMOVAL Left 10/17/2013   Procedure: HARDWARE REMOVAL LEFT HIP;  Surgeon: Gearlean Alf, MD;  Location: [facility];  Service: Orthopedics;  Laterality: Left;   HIP FRACTURE SURGERY Left 2006   3 screws   INSERTION OF MESH  02/08/2014   Procedure: INSERTION OF MESH;  Surgeon: Jackolyn Confer, MD;  Location: [facility];  Service: General;;   melanoma surgery   1986, 1997   PARATHYROIDECTOMY  04/02/15   [place] IMPEDANCE STUDY N/A 12/17/2015   Procedure: [place] IMPEDANCE STUDY;  Surgeon: Mauri Pole, MD;  [facility];  Service: Endoscopy;  Laterality: N/A;   TOTAL HIP ARTHROPLASTY Left 04/10/2014   Procedure: LEFT TOTAL HIP ARTHROPLASTY ANTERIOR APPROACH;  Surgeon: Gearlean Alf, MD;  [facility];  Service: Orthopedics;  Laterality: Left;   UMBILICAL HERNIA REPAIR N/A 02/08/2014   Procedure: HERNIA REPAIR UMBILICAL ADULT WITH MESH;  Surgeon: Jackolyn Confer, MD;  [facility];  Service: General;  Laterality: N/A;    There were no vitals filed for this visit.   Subjective Assessment - 09/12/21 1535     Subjective zoom classes re doing well but Medicare is trying to end them.    Currently in Pain? Yes    Pain Score 3     Pain Location Back    Pain Orientation Lower                               [facility] Adult PT Treatment/Exercise - 09/12/21 0001       High Level Balance   High Level Balance Activities Negotitating around obstacles;Negotiating over obstacles;Tandem walking;Side stepping   foam mat, steps, other obstacles   High Level Balance Comments ball toss and kicking      Lumbar Exercises: Aerobic   UBE (Upper Arm Bike) L 3 2 min fwd/2 mn backward    Nustep L 6 8  min      Lumbar Exercises: Machines for Strengthening   Cybex Lumbar Extension black tband flex and ext 2 sets 10    Other Lumbar Machine Exercise seated row 20# 2x10, lats 20#  2x10                       PT Short Term Goals - 03/21/21 1005       PT SHORT TERM GOAL #1   Title independent with HEP    Status Achieved               PT Long Term Goals - 09/05/21 1534       PT LONG TERM GOAL #1   Title go up and down stairs step over step consistently    Status Achieved      PT LONG TERM GOAL #2   Title return to independent gym program    Status Partially Met      PT LONG TERM GOAL #3   Title walk without device with minimal deviation    Status Achieved      PT LONG TERM GOAL #4   Title report pain decreased 50%    Status Partially Met      PT LONG TERM GOAL #5   Title decrease TUG time to 13 seconds    Status Achieved                   Plan - 09/12/21 1621     Clinical Impression Statement increased dynamic balance actvities working on navigating over and around obstacles, unsteady surfaces, ball toss and kicks- overall did well but hesitancy with stepping on dynamic syrfaces and HHS and stopping to think before stepping - cued for more fluid mvmt    PT Treatment/Interventions ADLs/Self Care Home Management;Cryotherapy;Electrical Stimulation;Iontophoresis 33m/ml Dexamethasone;Moist Heat;Gait training;Stair training;Functional mobility training;Therapeutic activities;Therapeutic exercise;Balance training;Neuromuscular re-education;Manual techniques;Patient/family education    PT Next Visit Plan continue to increase as she tolerates and look to have her move to on her own in the next month             Patient will benefit from skilled therapeutic intervention in order to improve the following deficits and impairments:     Visit Diagnosis: Muscle weakness (generalized)  Difficulty in walking, not elsewhere classified     Problem List Patient Active Problem List   Diagnosis Date Noted   Upper respiratory infection 01/15/2018   Postherpetic neuralgia 12/25/2016   Dysesthesia 12/25/2016  Polyneuropathy  12/25/2016   Exposure to influenza 04/01/2016   Dysphagia    Bronchiectasis without acute exacerbation ([place]) 08/30/2015   Bronchiectasis ([place]) 08/14/2015   Chronic rhinitis 08/14/2015   Avascular necrosis of femur head, left ([employer]) 04/10/2014   OA (osteoarthritis) of hip 04/10/2014   Painful orthopaedic hardware ([place]) 10/17/2013    Nyima Vanacker,ANGIE, PTA 09/12/2021, 4:23 PM  [place]. [place], [place], [address] Phone: [phone]   Fax:  [phone]  Name: Brooke Jimenez MRN: [id number] Date of Birth: Jun 19, 1945

## 2021-09-17 ENCOUNTER — Encounter: Payer: Self-pay | Admitting: Physical Therapy

## 2021-09-17 ENCOUNTER — Ambulatory Visit: Payer: Medicare Other | Admitting: Physical Therapy

## 2021-09-17 DIAGNOSIS — R262 Difficulty in walking, not elsewhere classified: Secondary | ICD-10-CM

## 2021-09-17 DIAGNOSIS — R600 Localized edema: Secondary | ICD-10-CM

## 2021-09-17 DIAGNOSIS — M6281 Muscle weakness (generalized): Secondary | ICD-10-CM

## 2021-09-17 DIAGNOSIS — G8929 Other chronic pain: Secondary | ICD-10-CM

## 2021-09-17 DIAGNOSIS — M25552 Pain in left hip: Secondary | ICD-10-CM

## 2021-09-17 DIAGNOSIS — M542 Cervicalgia: Secondary | ICD-10-CM

## 2021-09-17 DIAGNOSIS — M25572 Pain in left ankle and joints of left foot: Secondary | ICD-10-CM

## 2021-09-17 NOTE — Therapy (Signed)
[place]. [place], [place], [address] Phone: [phone]   Fax:  [phone]  Physical Therapy Treatment  Patient Details  Name: Brooke Jimenez MRN: [id number] Date of Birth: [birth year] Referring Provider (PT): Rolena Infante   Encounter Date: 09/17/2021   PT End of Session - 09/17/21 1635     Visit Number 34    Date for PT Re-Evaluation 10/04/21    Authorization Type Medicare    PT Start Time 1614    PT Stop Time 1700    PT Time Calculation (min) 46 min    Activity Tolerance Patient tolerated treatment well    Behavior During Therapy [facility] for tasks assessed/performed             Past Medical History:  Diagnosis Date   Acne rosacea    Anxiety    Arthritis    arthritis in hip    Bronchiectasis ([place])    Cataract    beginning   Complication of anesthesia    "really sore throat"   Corneal dystrophy    DDD (degenerative disc disease), cervical    and lower back   Diverticulosis    Dysphagia    chronic- please see ultrasound done 4/16 15 in [facility]    Dysrhythmia    sometimes patient has extra beats per patient    Esophageal dysmotility    Family history of adverse reaction to anesthesia    mother slow to wake up    GERD (gastroesophageal reflux disease)    under control   Glaucoma    "suspect"   Gout    one finger x1 attack   H/O hiatal hernia    Hearing loss    bilateral due to nerve loss. wears hearing aides   Heart murmur    no problem    History of chicken pox    History of melanoma    melanoma- 1986 and 1997    Hypothyroidism    Low back pain with sciatica    Measles    hx of   Mitral valve prolapse    Nocturia    Osteoporosis    Peripheral neuropathy    feet   PONV (postoperative nausea and vomiting)    PTSD (post-traumatic stress disorder) 2006   RSD (reflex sympathetic dystrophy) 1993   Spider veins    Urinary incontinence    occasional   Varicose veins     Past Surgical  History:  Procedure Laterality Date   ESOPHAGEAL MANOMETRY N/A 12/17/2015   Procedure: ESOPHAGEAL MANOMETRY (EM);  Surgeon: Mauri Pole, MD;  [facility];  Service: Endoscopy;  Laterality: N/A;   HARDWARE REMOVAL Left 10/17/2013   Procedure: HARDWARE REMOVAL LEFT HIP;  Surgeon: Gearlean Alf, MD;  [facility];  Service: Orthopedics;  Laterality: Left;   HIP FRACTURE SURGERY Left 2006   3 screws   INSERTION OF MESH  02/08/2014   Procedure: INSERTION OF MESH;  Surgeon: Jackolyn Confer, MD;  [facility];  Service: General;;   melanoma surgery   1986, 1997   PARATHYROIDECTOMY  04/02/15   [place] IMPEDANCE STUDY N/A 12/17/2015   Procedure: [place] IMPEDANCE STUDY;  Surgeon: Mauri Pole, MD;  [facility];  Service: Endoscopy;  Laterality: N/A;   TOTAL HIP ARTHROPLASTY Left 04/10/2014   Procedure: LEFT TOTAL HIP ARTHROPLASTY ANTERIOR APPROACH;  Surgeon: Gearlean Alf, MD;  [facility];  Service: Orthopedics;  Laterality: Left;   UMBILICAL HERNIA REPAIR N/A  02/08/2014   Procedure: HERNIA REPAIR UMBILICAL ADULT WITH MESH;  Surgeon: Jackolyn Confer, MD;  [facility];  Service: General;  Laterality: N/A;    There were no vitals filed for this visit.   Subjective Assessment - 09/17/21 1636     Subjective Patient with a lot of questions about how to maintain after PT, a lot of concern about this                               [facility] Adult PT Treatment/Exercise - 09/17/21 0001       Lumbar Exercises: Aerobic   UBE (Upper Arm Bike) L 3 3 min fwd/3 mn backward    Nustep L 6 8  min                     PT Education - 09/17/21 1714     Education Details Went over different ways to exercises, talked about stretches, cardio, strength etc..Marland KitchenPatient had diffiuclty knowing exercises, I asked to see her phone as I had sent her a complete program in the past, I worked to show here this and found it and taught her how to pull it  up as well as how to get her fit bit working so she can track    [employer]) Educated Patient    Methods Explanation;Demonstration;Handout    Comprehension Verbalized understanding;Returned demonstration              PT Short Term Goals - 03/21/21 1005       PT SHORT TERM GOAL #1   Title independent with HEP    Status Achieved               PT Long Term Goals - 09/17/21 1636       PT LONG TERM GOAL #2   Title return to independent gym program    Status Partially Met                   Plan - 09/17/21 1719     Clinical Impression Statement Many questions and issues regarding exercises, I asked about all that I have given her in the past and tried to help her find these, finally found the videos on her phone and then had her set her fit bit to track as well, this took a lot of time to set up and answer her questions    PT Next Visit Plan will need to add the stretches to her HEP and possibly balance on her phone    Consulted and Agree with Plan of Care Patient             Patient will benefit from skilled therapeutic intervention in order to improve the following deficits and impairments:  Difficulty walking, Abnormal gait, Cardiopulmonary status limiting activity, Decreased endurance, Pain, Decreased activity tolerance, Decreased balance, Impaired flexibility, Decreased strength, Decreased mobility  Visit Diagnosis: Muscle weakness (generalized)  Difficulty in walking, not elsewhere classified  Chronic bilateral low back pain without sciatica  Pain in left hip  Localized edema  Pain in left ankle and joints of left foot  Cervicalgia     Problem List Patient Active Problem List   Diagnosis Date Noted   Upper respiratory infection 01/15/2018   Postherpetic neuralgia 12/25/2016   Dysesthesia 12/25/2016   Polyneuropathy 12/25/2016   Exposure to influenza 04/01/2016   Dysphagia    Bronchiectasis without acute exacerbation ([place]) 08/30/2015  Bronchiectasis ([place]) 08/14/2015   Chronic rhinitis 08/14/2015   Avascular necrosis of femur head, left ([place]) 04/10/2014   OA (osteoarthritis) of hip 04/10/2014   Painful orthopaedic hardware ([place]) 10/17/2013    Sumner Boast, PT 09/17/2021, 5:21 PM  [place]. [place], [place], [address] Phone: [phone]   Fax:  [phone]  Name: Brooke Jimenez MRN: [id number] Date of Birth: 1945-11-01

## 2021-09-19 ENCOUNTER — Ambulatory Visit: Payer: Medicare Other | Admitting: Physical Therapy

## 2021-09-24 ENCOUNTER — Telehealth: Payer: Self-pay | Admitting: Neurology

## 2021-09-24 NOTE — Telephone Encounter (Signed)
She saw Rheumatology 07/05/2021 for dry eyes and positve SSA.  She was advised to f/u with ophthalmology and to f/u if additioanl rheumatologic symptoms develop.

## 2021-10-07 ENCOUNTER — Encounter: Payer: Medicare Other | Admitting: Physical Therapy

## 2021-10-21 ENCOUNTER — Telehealth: Payer: Self-pay | Admitting: Primary Care

## 2021-10-21 ENCOUNTER — Encounter: Payer: Self-pay | Admitting: Internal Medicine

## 2021-10-21 ENCOUNTER — Encounter (HOSPITAL_COMMUNITY): Payer: Self-pay | Admitting: Emergency Medicine

## 2021-10-21 ENCOUNTER — Ambulatory Visit (INDEPENDENT_AMBULATORY_CARE_PROVIDER_SITE_OTHER): Payer: Medicare Other | Admitting: Internal Medicine

## 2021-10-21 ENCOUNTER — Emergency Department (HOSPITAL_COMMUNITY): Payer: Medicare Other

## 2021-10-21 ENCOUNTER — Emergency Department (HOSPITAL_COMMUNITY)
Admission: EM | Admit: 2021-10-21 | Discharge: 2021-10-22 | Disposition: A | Discharge status: Home / Self Care | Payer: Medicare Other | Attending: Emergency Medicine | Admitting: Emergency Medicine

## 2021-10-21 VITALS — BP 138/70 | HR 120 | Temp 100.0°F | Ht 64.5 in | Wt 166.0 lb

## 2021-10-21 DIAGNOSIS — E039 Hypothyroidism, unspecified: Secondary | ICD-10-CM | POA: Diagnosis not present

## 2021-10-21 DIAGNOSIS — R000 Tachycardia, unspecified: Secondary | ICD-10-CM | POA: Diagnosis present

## 2021-10-21 DIAGNOSIS — Z79899 Other long term (current) drug therapy: Secondary | ICD-10-CM | POA: Insufficient documentation

## 2021-10-21 DIAGNOSIS — Z87891 Personal history of nicotine dependence: Secondary | ICD-10-CM | POA: Diagnosis not present

## 2021-10-21 DIAGNOSIS — Z20822 Contact with and (suspected) exposure to covid-19: Secondary | ICD-10-CM | POA: Diagnosis not present

## 2021-10-21 DIAGNOSIS — I471 Supraventricular tachycardia: Secondary | ICD-10-CM | POA: Insufficient documentation

## 2021-10-21 DIAGNOSIS — J029 Acute pharyngitis, unspecified: Secondary | ICD-10-CM | POA: Diagnosis not present

## 2021-10-21 DIAGNOSIS — R0981 Nasal congestion: Secondary | ICD-10-CM | POA: Diagnosis not present

## 2021-10-21 DIAGNOSIS — R059 Cough, unspecified: Secondary | ICD-10-CM | POA: Diagnosis not present

## 2021-10-21 DIAGNOSIS — R600 Localized edema: Secondary | ICD-10-CM | POA: Insufficient documentation

## 2021-10-21 DIAGNOSIS — Z7901 Long term (current) use of anticoagulants: Secondary | ICD-10-CM | POA: Diagnosis not present

## 2021-10-21 LAB — URINALYSIS, ROUTINE W REFLEX MICROSCOPIC
Bilirubin Urine: NEGATIVE
Glucose, UA: NEGATIVE mg/dL
Hgb urine dipstick: NEGATIVE
Ketones, ur: 20 mg/dL — AB
Nitrite: NEGATIVE
Protein, ur: 30 mg/dL — AB
Specific Gravity, Urine: 1.019 (ref 1.005–1.030)
pH: 6 (ref 5.0–8.0)

## 2021-10-21 LAB — CBC WITH DIFFERENTIAL/PLATELET
Abs Immature Granulocytes: 0.08 10*3/uL — ABNORMAL HIGH (ref 0.00–0.07)
Basophils Absolute: 0.1 10*3/uL (ref 0.0–0.1)
Basophils Relative: 1 %
Eosinophils Absolute: 0.1 10*3/uL (ref 0.0–0.5)
Eosinophils Relative: 0 %
HCT: 42.4 % (ref 36.0–46.0)
Hemoglobin: 13.4 g/dL (ref 12.0–15.0)
Immature Granulocytes: 1 %
Lymphocytes Relative: 10 %
Lymphs Abs: 1.3 10*3/uL (ref 0.7–4.0)
MCH: 29 pg (ref 26.0–34.0)
MCHC: 31.6 g/dL (ref 30.0–36.0)
MCV: 91.8 fL (ref 80.0–100.0)
Monocytes Absolute: 1.4 10*3/uL — ABNORMAL HIGH (ref 0.1–1.0)
Monocytes Relative: 10 %
Neutro Abs: 10.4 10*3/uL — ABNORMAL HIGH (ref 1.7–7.7)
Neutrophils Relative %: 78 %
Platelets: 235 10*3/uL (ref 150–400)
RBC: 4.62 MIL/uL (ref 3.87–5.11)
RDW: 14.5 % (ref 11.5–15.5)
WBC: 13.2 10*3/uL — ABNORMAL HIGH (ref 4.0–10.5)
nRBC: 0 % (ref 0.0–0.2)

## 2021-10-21 LAB — COMPREHENSIVE METABOLIC PANEL
ALT: 18 U/L (ref 0–44)
AST: 20 U/L (ref 15–41)
Albumin: 4.2 g/dL (ref 3.5–5.0)
Alkaline Phosphatase: 42 U/L (ref 38–126)
Anion gap: 9 (ref 5–15)
BUN: 11 mg/dL (ref 8–23)
CO2: 24 mmol/L (ref 22–32)
Calcium: 8.8 mg/dL — ABNORMAL LOW (ref 8.9–10.3)
Chloride: 106 mmol/L (ref 98–111)
Creatinine, Ser: 0.78 mg/dL (ref 0.44–1.00)
GFR, Estimated: >60 mL/min (ref >60)
Glucose, Bld: 117 mg/dL — ABNORMAL HIGH (ref 70–99)
Potassium: 4 mmol/L (ref 3.5–5.1)
Sodium: 139 mmol/L (ref 135–145)
Total Bilirubin: 1 mg/dL (ref 0.3–1.2)
Total Protein: 7.4 g/dL (ref 6.5–8.1)

## 2021-10-21 LAB — LACTIC ACID, PLASMA: Lactic Acid, Venous: 0.8 mmol/L (ref 0.5–1.9)

## 2021-10-21 LAB — SARS CORONAVIRUS 2 BY RT PCR: SARS Coronavirus 2 by RT PCR: NEGATIVE

## 2021-10-21 MED ORDER — ACETAMINOPHEN 325 MG PO TABS
650.0000 mg | ORAL_TABLET | Freq: Once | ORAL | Status: AC
  Administered 2021-10-22: 650 mg via ORAL
  Filled 2021-10-21: qty 2

## 2021-10-21 NOTE — Progress Notes (Signed)
$'@Patient'k$  ID: [name], female    DOB: May 05, 1945, 76 y.o.   MRN: [id number]  No chief complaint on file.   Referring provider: Karleen Hampshire., MD  HPI: 76 year old female, never smoker. PMH bronchiectasis. Former Dr. Pennie Banter patient, last see on 01/27/18. Added hypertonic saline nebulizer twice daily. Maintained on flutter valve.   Previous [facility] pulmonary encounters: 01/15/2018 Patient presents today for acute visit with complaints of dry cough, head congestion, sore throat x2 days. Reports a fever of 101.5 last night. Afebrile today in office.  Reports that her symptoms started with a really bad sore throat and then nasal congestion.  Developed tickle in her throat that caused a dry cough. No mucus production. Eating and drinking ok.  She has not taken Tylenol.  He had a crown dental procedure on Nov 11th.  Reports that her jaw is sore. Able to eat yesterday.  Took clindamycin prior to procedure.  Denies sinus pain, sob, wheeze, nausea, vomiting. Loose BM yesterday.   01/19/2018  Patient returns today for continued complains of not feeling well. Reports runny nose, sinus congestion and dry cough. Most of her cough has been due to throat tickle. Associated sore throat d/t coughing. Has to clear her throat in the morning. Some sinus pressure and teeth pain. Mucus was clear, this morning it was thicker with some yellow color. Reports right back/pleuretic pain. Symptoms started 6-7 days ago. She has not taken any cough syrup or sinus medication. Used halls. Going away on trip in 4 days.   02/01/2018 Patient returns with continued complaints of cough and congestion. Flu swab negative, throat culture negative for strep. CXR clear. Given course of doxycyline for acute exacerbation of bronchiectasis. States that her sore throat has resolved. Cough is loose and not dry. Doesn't cough mucus up, hasn't seen any color. Breathing is fine. Afebrile.    -.......................................................................................................................................Marland Kitchen 10/21/21- 76 yoF for acute visit Onset 5 days ago - headache, sore throat, dry cough. Covid test neg She says Rheumatology considered elevated Sjogrens assay "false positive". D-dimer was elevated after fall which injured L thigh.  Allergies  Allergen Reactions   Codeine Nausea And Vomiting and Other (See Comments)    DILAUDID AND TRAMADOL ARE OKAY.  Vomiting DILAUDID AND TRAMADOL ARE OKAY.  Vomiting DILAUDID AND TRAMADOL ARE OKAY.     Levofloxacin Nausea Only, Nausea And Vomiting and Other (See Comments)    Vomiting Vomiting    Levaquin [Levofloxacin In D5w] Nausea And Vomiting   Neosporin  [Neomycin-Bacitracin Zn-Polymyx]     Other reaction(s): Other (See Comments) Blisters, Erythema   Adhesive [Tape] Rash   Epinephrine Nausea Only and Other (See Comments)    Heart pounds very hard Heart pounds very hard Heart pounds very hard Heart pounds very hard Heart pounds very hard    Erythromycin Other (See Comments), Nausea Only and Nausea And Vomiting    Abdominal pain  Abdominal pain  Abdominal pain  Abdominal pain    Guaifenesin Nausea Only   Minocycline Dermatitis    Skin discoloration on ankles per pt Skin discoloration on ankles per pt, but has had it since   Nickel Rash   Nsaids     Stomach aches   Sulfa Antibiotics Rash    Immunization History  Administered Date(s) Administered   Influenza Split 06/13/2008, 11/13/2010, 11/12/2011, 12/15/2012, 10/31/2014, 10/31/2015, 01/11/2016, 03/20/2017, 12/10/2017, 11/10/2018   Influenza, High Dose Seasonal PF 01/11/2016, 03/20/2017, 12/10/2017, 10/26/2019   Influenza,inj,quad, With Preservative 11/25/2018   Influenza-Unspecified 06/13/2008, 11/13/2010,  11/12/2011, 12/15/2012, 10/31/2014, 11/10/2018   Moderna Sars-Covid-2 Vaccination 11/08/2020   PFIZER Comirnaty(Gray Top)Covid-19  Tri-Sucrose Vaccine 10/09/2019, 05/30/2020   PFIZER(Purple Top)SARS-COV-2 Vaccination 03/15/2019, 04/05/2019   Pneumococcal Conjugate-13 10/31/2014, 12/28/2014   Pneumococcal Polysaccharide-23 07/02/2016, 02/03/2018   Pneumococcal-Unspecified 04/25/2018   Tdap 12/03/2010   Zoster Recombinat (Shingrix) 12/18/2017, 04/15/2018   Zoster, Live 08/12/2010    Past Medical History:  Diagnosis Date   Acne rosacea    Anxiety    Arthritis    arthritis in hip    Bronchiectasis ([facility])    Cataract    beginning   Complication of anesthesia    "really sore throat"   Corneal dystrophy    DDD (degenerative disc disease), cervical    and lower back   Diverticulosis    Dysphagia    chronic- please see ultrasound done 4/16 15 in [facility]    Dysrhythmia    sometimes patient has extra beats per patient    Esophageal dysmotility    Family history of adverse reaction to anesthesia    mother slow to wake up    GERD (gastroesophageal reflux disease)    under control   Glaucoma    "suspect"   Gout    one finger x1 attack   H/O hiatal hernia    Hearing loss    bilateral due to nerve loss. wears hearing aides   Heart murmur    no problem    History of chicken pox    History of melanoma    melanoma- 1986 and 1997    Hypothyroidism    Low back pain with sciatica    Measles    hx of   Mitral valve prolapse    Nocturia    Osteoporosis    Peripheral neuropathy    feet   PONV (postoperative nausea and vomiting)    PTSD (post-traumatic stress disorder) 2006   RSD (reflex sympathetic dystrophy) 1993   Spider veins    Urinary incontinence    occasional   Varicose veins     Tobacco History: Social History   Tobacco Use  Smoking Status Never  Smokeless Tobacco Never   Counseling given: Not Answered   Outpatient Medications Prior to Visit  Medication Sig Dispense Refill   calcium carbonate (TUMS - DOSED IN MG ELEMENTAL CALCIUM) 500 MG chewable tablet Chew 3 tablets by mouth daily as  needed for indigestion or heartburn.      D2000 ULTRA STRENGTH 50 MCG (2000 UT) CAPS Take 2 capsules by mouth daily.     doxycycline (VIBRA-TABS) 100 MG tablet Take 1 tablet (100 mg total) by mouth 2 (two) times daily. (Patient taking differently: Take 100 mg by mouth as needed (for rosacea flare ups).) 20 tablet 0   levothyroxine (SYNTHROID, LEVOTHROID) 75 MCG tablet Take 75 mcg by mouth daily before breakfast.     Multiple Vitamin (MULTIVITAMIN WITH MINERALS) TABS tablet Take 1 tablet by mouth daily.     sodium chloride (MURO 128) 5 % ophthalmic ointment Muro 128 5 % eye ointment     No facility-administered medications prior to visit.   Review of Systems ROS-see HPI   + = positive Constitutional:    weight loss, night sweats, +fevers, chills, fatigue, lassitude. HEENT:    +headaches, difficulty swallowing, tooth/dental problems, sore throat,       sneezing, itching, ear ache, nasal congestion, post nasal drip, snoring CV:    chest pain, orthopnea, PND, +swelling in lower extremities, anasarca,  dizziness, palpitations Resp:   shortness of breath with exertion or at rest.                productive cough,   +non-productive cough, coughing up of blood.              change in color of mucus.  wheezing.   Skin:    rash or lesions. GI:  No-   heartburn, indigestion, abdominal pain, nausea, vomiting, diarrhea,                 change in bowel habits, loss of appetite GU: dysuria, change in color of urine, no urgency or frequency.   flank pain. MS:   joint pain, stiffness, decreased range of motion, back pain. Neuro-     nothing unusual Psych:  change in mood or affect.  depression or anxiety.   memory loss.   Physical Exam OBJ- Physical Exam General- Alert, Oriented, Affect-appropriate, Distress- none acute Skin- rash-none, lesions- none, excoriation- none Lymphadenopathy- none Head- atraumatic            Eyes- Gross vision intact, PERRLA, conjunctivae and  secretions clear            Ears- +Hard of hearing            Nose- Clear, no-Septal dev, mucus, polyps, erosion, perforation             Throat- Mallampati II , mucosa clear , drainage- none, tonsils- atrophic Neck- flexible , trachea midline, no stridor , thyroid nl, carotid no bruit Chest - symmetrical excursion , unlabored           Heart/CV- +rapid regular , no murmur , no gallop  , no rub, nl s1 s2                           - JVD- none , edema+2 L lower leg, stasis changes- none, varices- none           Lung- +few crackles, wheeze- none, cough- none , dullness-none, rub- none           Chest wall-  Abd-  Br/ Gen/ Rectal- Not done, not indicated Extrem- cyanosis- none, clubbing, none, atrophy- none, strength- nl Neuro- grossly intact to observation    Lab Results:   EKG- junctional tachycardia 153     Assessment & Plan:  I suspect acute viral illness, with tachycardia secondary to fever. She has a complicated medical history with bronchiectasis, abnormal Sjogren's assay x 2. [facility]ology at [facility], Pulmonary at [facility].   Fever   Junctional tachycardia Hx Bronchiectasis  Plan- Husband personal vehicle transport to [facility] ER   Baird Lyons, MD 10/21/2021

## 2021-10-21 NOTE — ED Triage Notes (Addendum)
Pt reports that her pulmonologist told her to come in because she had junctional tachycardia. States that her HR was 150. Reports pain from sitting in the lobby. Reports some dizziness with walking.

## 2021-10-21 NOTE — Patient Instructions (Signed)
Tachycardia and fever- husband can take patient to ED personal vehicle.

## 2021-10-21 NOTE — Telephone Encounter (Signed)
Called and spoke with pt who states she began having complaints of runny nose, cough that is a dry cough, fever as high as 100.4, and also has had a sore throat.  Pt stated she took a covid test last night 8/27 which came back negative.  Pt said she has been taking tylenol to help with her fever. Temp today has been range of 99.  Checked with CY to see if he would be okay seeing pt knowing that she has had a negative covid test and CY stated he would be okay seeing pt in office. Have scheduled pt an appt with CY at 4pm. Nothing further needed.

## 2021-10-21 NOTE — ED Provider Triage Note (Signed)
Emergency Medicine Provider Triage Evaluation Note  Brooke Jimenez , a 76 y.o. female  was evaluated in triage.  Pt complains of cough and shortness of breath.  She has a history of bronchiectasis.  4 days ago she developed cough, sore throat, headache.  She was concerned that she was developing COVID.  She went to her pulmonologist today who referred her to the emergency department because her heart rate was elevated.  Last took medication for fever at 2 PM.  Review of Systems  Positive: Fever, cough Negative: Abdominal pain  Physical Exam  BP (!) 148/83   Pulse (!) 102   Temp (!) 100.9 F (38.3 C) Comment: states "it goes up in the evening"  Resp 18   SpO2 97%  Gen:   Awake, no distress   Resp:  Normal effort  MSK:   Moves extremities without difficulty  Other:  Mild tachycardia, reg rhythm  Medical Decision Making  Medically screening exam initiated at 7:48 PM.  Appropriate orders placed.  Brooke Jimenez was informed that the remainder of the evaluation will be completed by another provider, this initial triage assessment does not replace that evaluation, and the importance of remaining in the ED until their evaluation is complete.     Carlisle Cater, PA-C 10/21/21 1950

## 2021-10-22 ENCOUNTER — Encounter (HOSPITAL_COMMUNITY): Payer: Self-pay | Admitting: Emergency Medicine

## 2021-10-22 DIAGNOSIS — I471 Supraventricular tachycardia: Secondary | ICD-10-CM | POA: Diagnosis not present

## 2021-10-22 LAB — TROPONIN I (HIGH SENSITIVITY): Troponin I (High Sensitivity): 9 ng/L (ref <18)

## 2021-10-22 LAB — LACTIC ACID, PLASMA: Lactic Acid, Venous: 1 mmol/L (ref 0.5–1.9)

## 2021-10-22 MED ORDER — METOPROLOL SUCCINATE ER 25 MG PO TB24: 25.0000 mg | ORAL_TABLET | Freq: Every day | ORAL | 0 refills | Status: DC

## 2021-10-22 MED ORDER — SODIUM CHLORIDE 0.9 % IV BOLUS
1000.0000 mL | Freq: Once | INTRAVENOUS | Status: AC
  Administered 2021-10-22: 1000 mL via INTRAVENOUS

## 2021-10-22 MED ORDER — APIXABAN 5 MG PO TABS
5.0000 mg | ORAL_TABLET | Freq: Once | ORAL | Status: AC
  Administered 2021-10-22: 5 mg via ORAL
  Filled 2021-10-22: qty 1

## 2021-10-22 MED ORDER — APIXABAN 5 MG PO TABS: 5.0000 mg | ORAL_TABLET | Freq: Two times a day (BID) | ORAL | 0 refills | Status: DC

## 2021-10-22 NOTE — Discharge Instructions (Signed)
You were seen in the ER today for your rapid heart rate. You are likely experiencing Atrial Flutter, which is a type of abnormal heart rhythm.  To manage this you have been started on 2 medications, the first is a rate control medication called metoprolol which you should take once daily.  The second is an anticoagulation medication called Eliquis which she will take twice per day.  You were given your first dose of this medication in the emergency department.  You may start both of these medications for your first dose of this evening.  Please call Dr. Kyla Balzarine office today to schedule follow-up appointment.  You have also been given a referral but they may prioritize your follow-up. Return to the ER if you develop any new chest pain, shortness of breath, palpitations, or any other new severe symptom.  Additionally return to the ER if you have any falls, car accidents, or other traumas where you injure your head as you are at higher risk after starting your blood thinning medicine.

## 2021-10-22 NOTE — ED Notes (Signed)
Pt ambulatory without assistance.  

## 2021-10-22 NOTE — ED Notes (Signed)
Pt currently denies any chest pain, acute shortness of breath.

## 2021-10-22 NOTE — ED Provider Notes (Signed)
I provided a substantive portion of the care of this patient.  I personally performed the entirety of the history, exam, and medical decision making for this encounter.  Patient here at the request of her outpatient doctors secondary to tachycardia that was found in office visit.  Patient apparently gets elevated temperatures at night and had that here but has no infectious symptoms.  On her vital signs she was initially normal but then had some tachycardia then normalized again and then when evaluated in the room her heart rate was consistently at 150.  Barely ever wavered from there.  She is asymptomatic.  Vital signs otherwise were unremarkable.  While of is speaking with the patient she was given fluids and immediately dropped from 1 50-100.  Looked at the initial EKG and the second EKG it does appear that there is a different source of the P wave based on the PR interval and the P wave morphology.  Initially EKG said SVT however the situation (consistently at 150 and sudden drop to normal sinus) seems more consistent with A flutter with a 2-1 block.  She has no history of the same.  We will start her on low-dose metoprolol and Eliquis pending cardiology follow-up she already has a cardiologist, Dr. Johnsie Cancel.  CRITICAL CARE Performed by: Merrily Pew Total critical care time: 35 minutes Critical care time was exclusive of separately billable procedures and treating other patients. Critical care was necessary to treat or prevent imminent or life-threatening deterioration. Critical care was time spent personally by me on the following activities: development of treatment plan with patient and/or surrogate as well as nursing, discussions with consultants, evaluation of patient's response to treatment, examination of patient, obtaining history from patient or surrogate, ordering and performing treatments and interventions, ordering and review of laboratory studies, ordering and review of radiographic studies,  pulse oximetry and re-evaluation of patient's condition.   EKG Interpretation  Date/Time:  Monday October 21 2021 20:02:42 EDT Ventricular Rate:  94 PR Interval:  130 QRS Duration: 79 QT Interval:  370 QTC Calculation: 463 R Axis:   78 Text Interpretation: Sinus rhythm Ventricular premature complex Confirmed by Merrily Pew [phone]) on 10/21/2021 11:03:54 PM   EKG Interpretation  Date/Time:  Tuesday October 22 2021 02:52:33 EDT Ventricular Rate:  151 PR Interval:  65 QRS Duration: 80 QT Interval:  303 QTC Calculation: 481 R Axis:   70 Text Interpretation: appears to be sinus but p wave and pr interval different than previous and right at 150 making Aflutter most likely. doubt SVT. Confirmed by Merrily Pew [phone]) on 10/22/2021 3:34:30 AM        EKG Interpretation  Date/Time:  Tuesday October 22 2021 03:18:22 EDT Ventricular Rate:  99 PR Interval:  132 QRS Duration: 80 QT Interval:  340 QTC Calculation: 437 R Axis:   67 Text Interpretation: Sinus rhythm Ventricular premature complex Consider right atrial enlargement Minimal ST depression, lateral leads Confirmed by Merrily Pew [phone]) on 10/22/2021 3:34:59 AM          Adeline Petitfrere, Corene Cornea, MD 10/22/21 2831

## 2021-10-22 NOTE — ED Notes (Signed)
Pt ambulatory without any staff assistance, pt denies any shortness of breath, dizziness, weakness.

## 2021-10-22 NOTE — ED Provider Notes (Signed)
[facility] DEPT Provider Note   CSN: [id number] Arrival date & time: 10/21/21  1706     History  No chief complaint on file.   Brooke Jimenez is a 76 y.o. female who presents on referral from her pulmonologist for episode of rapid heart rate while in the office today.  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  Was found to have heart rate in the 150s in the office and directed to the ED.  Patient is without chest pain, sensation of palpitations, or shortness of breath at this time.  I personally reviewed her medical records.  She has a history of osteoarthritis, bronchiectasis, hypothyroidism, mitral valve prolapse.  She is not anticoagulated or any sort of rate control.  She states that she is only on levothyroxine daily.  She follows Dr. Johnsie Cancel for cardiology for her mitral valve disease.  Has not seen his office since 2022.  HPI     Home Medications Prior to Admission medications   Medication Sig Start Date End Date Taking? Authorizing Provider  apixaban (ELIQUIS) 5 MG TABS tablet Take 1 tablet (5 mg total) by mouth 2 (two) times daily. 10/22/21 11/21/21 Yes Berthel Bagnall R, PA-C  metoprolol succinate (TOPROL-XL) 25 MG 24 hr tablet Take 1 tablet (25 mg total) by mouth daily. 10/22/21  Yes Jaystin Mcgarvey R, PA-C  calcium carbonate (TUMS - DOSED IN MG ELEMENTAL CALCIUM) 500 MG chewable tablet Chew 3 tablets by mouth daily as needed for indigestion or heartburn.     [provider]  D2000 ULTRA STRENGTH 50 MCG (2000 UT) CAPS Take 2 capsules by mouth daily. 07/23/21   [provider]  doxycycline (VIBRA-TABS) 100 MG tablet Take 1 tablet (100 mg total) by mouth 2 (two) times daily. Patient not taking: Reported on 10/21/2021 01/11/20   Martyn Ehrich, NP  levothyroxine (SYNTHROID, LEVOTHROID) 75 MCG tablet Take  75 mcg by mouth daily before breakfast.    [provider]  Multiple Vitamin (MULTIVITAMIN WITH MINERALS) TABS tablet Take 1 tablet by mouth daily.    [provider]  sodium chloride (MURO 128) 5 % ophthalmic ointment Muro 128 5 % eye ointment    [provider]      Allergies    Codeine, Levofloxacin, Levaquin [levofloxacin in d5w], Neosporin  [neomycin-bacitracin zn-polymyx], Adhesive [tape], Epinephrine, Erythromycin, Guaifenesin, Minocycline, Nickel, Nsaids, and Sulfa antibiotics    Review of Systems   Review of Systems  Constitutional:  Positive for fever. Negative for chills, diaphoresis and fatigue.  HENT:  Positive for congestion.   Respiratory:  Positive for cough.   Cardiovascular:  Positive for palpitations.  Genitourinary: Negative.   Musculoskeletal: Negative.   Neurological: Negative.     Physical Exam Updated Vital Signs BP (!) 147/78 (BP Location: Left Arm)   Pulse 87   Temp 98.6 F (37 C) (Oral)   Resp 18   Wt 76.6 kg   SpO2 96%   BMI 28.54 kg/m  Physical Exam Vitals and nursing note reviewed.  Constitutional:      Appearance: She is not ill-appearing or toxic-appearing.  HENT:     Head: Normocephalic and atraumatic.     Nose: Congestion present. No rhinorrhea.     Mouth/Throat:     Mouth: Mucous membranes are moist.     Pharynx: No oropharyngeal exudate or posterior oropharyngeal erythema.  Eyes:  General:        Right eye: No discharge.        Left eye: No discharge.     Extraocular Movements: Extraocular movements intact.     Conjunctiva/sclera: Conjunctivae normal.     Pupils: Pupils are equal, round, and reactive to light.  Cardiovascular:     Rate and Rhythm: Regular rhythm. Tachycardia present.     Pulses: Normal pulses.     Heart sounds: Normal heart sounds. No murmur heard.    Comments: At time of my arrival to bedside patient is tachycardic to the 150s with regular rhythm, and remains  asymptomatic. Pulmonary:     Effort: Pulmonary effort is normal. No tachypnea, bradypnea, accessory muscle usage, prolonged expiration or respiratory distress.     Breath sounds: Examination of the right-lower field reveals rhonchi. Rhonchi present. No wheezing or rales.  Abdominal:     General: Bowel sounds are normal. There is no distension.     Palpations: Abdomen is soft.     Tenderness: There is no abdominal tenderness. There is no guarding or rebound.  Musculoskeletal:        General: No deformity.     Cervical back: Neck supple.     Right lower leg: 1+ Edema present.     Left lower leg: 1+ Edema present.  Skin:    General: Skin is warm and dry.     Capillary Refill: Capillary refill takes less than 2 seconds.  Neurological:     General: No focal deficit present.     Mental Status: She is alert and oriented to person, place, and time. Mental status is at baseline.  Psychiatric:        Mood and Affect: Mood normal.     ED Results / Procedures / Treatments   Labs (all labs ordered are listed, but only abnormal results are displayed) Labs Reviewed  COMPREHENSIVE METABOLIC PANEL - Abnormal; Notable for the following components:      Result Value   Glucose, Bld 117 (*)    Calcium 8.8 (*)    All other components within normal limits  CBC WITH DIFFERENTIAL/PLATELET - Abnormal; Notable for the following components:   WBC 13.2 (*)    Neutro Abs 10.4 (*)    Monocytes Absolute 1.4 (*)    Abs Immature Granulocytes 0.08 (*)    All other components within normal limits  URINALYSIS, ROUTINE W REFLEX MICROSCOPIC - Abnormal; Notable for the following components:   Ketones, ur 20 (*)    Protein, ur 30 (*)    Leukocytes,Ua TRACE (*)    Bacteria, UA RARE (*)    All other components within normal limits  SARS CORONAVIRUS 2 BY RT PCR  RESPIRATORY PANEL BY PCR  RESP PANEL BY RT-PCR (FLU A&B, COVID) [id number]  LACTIC ACID, PLASMA  LACTIC ACID, PLASMA  TROPONIN I (HIGH SENSITIVITY)     EKG EKG Interpretation  Date/Time:  Tuesday October 22 2021 03:18:22 EDT Ventricular Rate:  99 PR Interval:  132 QRS Duration: 80 QT Interval:  340 QTC Calculation: 437 R Axis:   67 Text Interpretation: Sinus rhythm Ventricular premature complex Consider right atrial enlargement Minimal ST depression, lateral leads Confirmed by Merrily Pew [phone]) on 10/22/2021 3:34:59 AM  Radiology DG Chest 2 View  Result Date: 10/21/2021 CLINICAL DATA:  Possible sepsis, shortness of breath EXAM: CHEST - 2 VIEW COMPARISON:  01/11/2020 FINDINGS: Transverse diameter of heart is in upper limits of normal. Thoracic aorta is tortuous. Lung fields are clear  of any infiltrates or pulmonary edema. There is no pleural effusion or pneumothorax. IMPRESSION: No active cardiopulmonary disease. Electronically Signed   By: Elmer Picker M.D.   On: 10/21/2021 20:29    Procedures .Critical Care  Performed by: Emeline Darling, PA-C Authorized by: Emeline Darling, PA-C   Critical care provider statement:    Critical care time (minutes):  45   Critical care was time spent personally by me on the following activities:  Development of treatment plan with patient or surrogate, discussions with consultants, evaluation of patient's response to treatment, examination of patient, obtaining history from patient or surrogate, ordering and performing treatments and interventions, ordering and review of laboratory studies, ordering and review of radiographic studies, pulse oximetry and re-evaluation of patient's condition     Medications Ordered in ED Medications  acetaminophen (TYLENOL) tablet 650 mg (650 mg Oral Given 10/22/21 0312)  sodium chloride 0.9 % bolus 1,000 mL (0 mLs Intravenous Stopped 10/22/21 0414)  apixaban (ELIQUIS) tablet 5 mg (5 mg Oral Given 10/22/21 0557)    ED Course/ Medical Decision Making/ A&P  CHADS2VASC OF 3                         Medical Decision Making 76 year old female  presents with concern for rapid heart rate.  Differential diagnosis includes but is not limited to SVT, atrial flutter, sinus tachycardia, atrial fibrillation, WPW.  Mildly tachycardic with heart rate of 101 on intake, hypertensive.  Vital signs otherwise normal.  Cardiac exam at time my evaluation with tachycardic regular rhythm with heart rate in the 150s, pulmonary exam as above overall unremarkable.  Abdominal exam is benign.  Neurovascular intact in all extremities.  GCS of 15.  Mild nasal congestion.  Amount and/or Complexity of Data Reviewed Labs:     Details: CBC with mild cytosis of 13,000 but otherwise unremarkable.  CMP unremarkable.  Urine with ketonuria, proteinuria trace leukocytes and rare bacteria but no evidence of infection at this time.  COVID test is negative.  Lactic acid is normal x2 and troponin is negative, 9.   ECG/medicine tests:     Details: EKG on intake with sinus rhythm with PVCs but without STEMI or dysrhythmia.  Repeat EKG obtained given sudden onset of rapid heart rate with rate in the 150s, regular most consistent with atrial flutter rather than SVT per attending physician.  No STEMI.  Repeat EKG after conversion to normal rate with normal sinus rhythm with some PVCs but no STEMI.   Risk Prescription drug management.  Case discussed at length with ED physician Dr. Dayna Barker; clinical picture less consistent with SVT and more consistent with atrial flutter therefore there is no use of adenosine.  IV fluids were started and patient reevaluated by ED providers, at which time she spontaneously converted back to normal sinus rhythm with rate in the 90s.  She remained asymptomatic throughout this episode.  Clinical patient was consistent with atrial tachycardia.  We will plan to start patient on low-dose metoprolol and anticoagulation and have her follow-up closely with Dr. Kyla Balzarine office in the outpatient setting.  No further work-up warranted in the ED this time.   Clinical concern for more emergent underlying etiology that would warrant further ED work-up or inpatient management is low.  Kayden and her husband voiced understanding of her medical evaluation and treatment plan. Each of their questions answered to their expressed satisfaction.  Return precautions were given.  Patient is well-appearing, stable, and  was discharged in good condition.   This chart was dictated using voice recognition software, Dragon. Despite the best efforts of this provider to proofread and correct errors, errors may still occur which can change documentation meaning.  Final Clinical Impression(s) / ED Diagnoses Final diagnoses:  Atrial tachycardia ([place])    Rx / DC Orders ED Discharge Orders          Ordered    Amb referral to [facility]        10/22/21 0541    apixaban (ELIQUIS) 5 MG TABS tablet  2 times daily        10/22/21 0541    metoprolol succinate (TOPROL-XL) 25 MG 24 hr tablet  Daily        10/22/21 0542    Ambulatory referral to Cardiology       Comments: If you have not heard from the Cardiology office within the next 72 hours please call [phone].   10/22/21 0543              Huma Imhoff, Gypsy Balsam, PA-C 10/22/21 9794    Mesner, Corene Cornea, MD 10/23/21 0004

## 2021-10-23 ENCOUNTER — Encounter: Payer: Self-pay | Admitting: Nurse Practitioner

## 2021-10-23 ENCOUNTER — Ambulatory Visit: Payer: Medicare Other | Admitting: Physical Therapy

## 2021-10-23 ENCOUNTER — Ambulatory Visit: Payer: Medicare Other | Attending: Nurse Practitioner | Admitting: Nurse Practitioner

## 2021-10-23 ENCOUNTER — Ambulatory Visit (INDEPENDENT_AMBULATORY_CARE_PROVIDER_SITE_OTHER): Payer: Medicare Other

## 2021-10-23 VITALS — BP 126/70 | HR 77 | Ht 64.5 in | Wt 164.0 lb

## 2021-10-23 DIAGNOSIS — I4892 Unspecified atrial flutter: Secondary | ICD-10-CM

## 2021-10-23 DIAGNOSIS — E039 Hypothyroidism, unspecified: Secondary | ICD-10-CM | POA: Insufficient documentation

## 2021-10-23 DIAGNOSIS — I351 Nonrheumatic aortic (valve) insufficiency: Secondary | ICD-10-CM | POA: Diagnosis present

## 2021-10-23 DIAGNOSIS — R052 Subacute cough: Secondary | ICD-10-CM | POA: Insufficient documentation

## 2021-10-23 NOTE — Progress Notes (Unsigned)
ZIO XT serial # [id number] from office inventory applied to patient.  Dr. Johnsie Cancel to read.

## 2021-10-23 NOTE — Progress Notes (Signed)
Cardiology Office Note:    Date:  10/23/2021   ID:  Brooke Jimenez, DOB 06-06-1945, MRN [id number]  PCP:  Brooke Mainland, MD   [facility] Providers Cardiologist:  None     Referring MD: Brooke Jimenez, *   Chief Complaint: hospital follow-up atrial flutter  History of Present Illness:    Brooke Jimenez is a pleasant 76 y.o. female with a hx of mitral valve disease, no history of rheumatic disease, dyspnea, and bronchiectasis.   History of GI consults for silent aspiration, GERD, IBS, and dysmotility.  She was referred to cardiology by Dr. For further evaluation of mitral valve disease and initially seen 08/15/2019 by Dr. Johnsie Jimenez.  Reports no recent echo to review normal myoview 09/16/19 with no evidence of ischemia or infarction.  Echo 09/16/2019 with only mild AR and no mitral valve disease.   Last seen in our office on 05/17/2020 by Dr. Johnsie Jimenez. No specific cardiac complaints and only mild AR on echo. Recommended to follow-up as needed.   Today, she is here for evaluation of tachycardia from ED visit on 8/28. She is speaking very softly due to sore throat. Recent respiratory illness for which she was seeing Dr. Annamaria Jimenez, pulmonologist and was found to have tachycardia at 150 bpm, concerning for atrial flutter.  ED notes reveal initial EKG said SVT however with consistent HR of 150, provider felt EKG more consistent with atrial flutter with 2-1 block.  Was started on metoprolol and Eliquis and advised to follow-up with cardiology. She reports no awareness of an abnormal heart rhythm. Had been coughing hard dry cough over the previous two weeks. Has been more SOB since onset of viral symptoms, wonders now if she was having arrhythmia prior to office visit on 8/28. She denies chest pain, fatigue, palpitations, melena, hematuria, hemoptysis, diaphoresis, weakness, presyncope, syncope, orthopnea, and PND. Chronic bilateral LE edema, left > right.   Past Medical History:  Diagnosis  Date   Acne rosacea    Anxiety    Arthritis    arthritis in hip    Bronchiectasis (HCC)    Cataract    beginning   Complication of anesthesia    "really sore throat"   Corneal dystrophy    DDD (degenerative disc disease), cervical    and lower back   Diverticulosis    Dysphagia    chronic- please see ultrasound done 4/16 15 in EP[facility]    Dysrhythmia    sometimes patient has extra beats per patient    Esophageal dysmotility    Family history of adverse reaction to anesthesia    mother slow to wake up    GERD (gastroesophageal reflux disease)    under control   Glaucoma    "suspect"   Gout    one finger x1 attack   H/O hiatal hernia    Hearing loss    bilateral due to nerve loss. wears hearing aides   Heart murmur    no problem    History of chicken pox    History of melanoma    melanoma- 1986 and 1997    Hypothyroidism    Low back pain with sciatica    Measles    hx of   Mitral valve prolapse    Nocturia    Osteoporosis    Peripheral neuropathy    feet   PONV (postoperative nausea and vomiting)    PTSD (post-traumatic stress disorder) 2006   RSD (reflex sympathetic dystrophy) 1993   Spider veins  Urinary incontinence    occasional   Varicose veins     Past Surgical History:  Procedure Laterality Date   ESOPHAGEAL MANOMETRY N/A 12/17/2015   Procedure: ESOPHAGEAL MANOMETRY (EM);  Surgeon: Brooke Pole, MD;  Location: [facility];  Service: Endoscopy;  Laterality: N/A;   HARDWARE REMOVAL Left 10/17/2013   Procedure: HARDWARE REMOVAL LEFT HIP;  Surgeon: Brooke Alf, MD;  Location: [facility];  Service: Orthopedics;  Laterality: Left;   HIP FRACTURE SURGERY Left 2006   3 screws   INSERTION OF MESH  02/08/2014   Procedure: INSERTION OF MESH;  Surgeon: Brooke Confer, MD;  Location: [facility];  Service: General;;   melanoma surgery   1986, 1997   PARATHYROIDECTOMY  04/02/15   [place] IMPEDANCE STUDY N/A 12/17/2015   Procedure: [place] IMPEDANCE STUDY;  Surgeon:  Brooke Pole, MD;  Location: [facility];  Service: Endoscopy;  Laterality: N/A;   TOTAL HIP ARTHROPLASTY Left 04/10/2014   Procedure: LEFT TOTAL HIP ARTHROPLASTY ANTERIOR APPROACH;  Surgeon: Brooke Alf, MD;  Location: [facility];  Service: Orthopedics;  Laterality: Left;   UMBILICAL HERNIA REPAIR N/A 02/08/2014   Procedure: HERNIA REPAIR UMBILICAL ADULT WITH MESH;  Surgeon: Brooke Confer, MD;  Location: [facility];  Service: General;  Laterality: N/A;    Current Medications: Current Meds  Medication Sig   apixaban (ELIQUIS) 5 MG TABS tablet Take 1 tablet (5 mg total) by mouth 2 (two) times daily.   calcium carbonate (TUMS - DOSED IN MG ELEMENTAL CALCIUM) 500 MG chewable tablet Chew 3 tablets by mouth daily as needed for indigestion or heartburn.    D2000 ULTRA STRENGTH 50 MCG (2000 UT) CAPS Take 2 capsules by mouth daily.   doxycycline (VIBRA-TABS) 100 MG tablet Take 1 tablet (100 mg total) by mouth 2 (two) times daily.   levothyroxine (SYNTHROID, LEVOTHROID) 75 MCG tablet Take 75 mcg by mouth daily before breakfast.   metoprolol succinate (TOPROL-XL) 25 MG 24 hr tablet Take 1 tablet (25 mg total) by mouth daily.   Multiple Vitamin (MULTIVITAMIN WITH MINERALS) TABS tablet Take 1 tablet by mouth daily.   sodium chloride (MURO 128) 5 % ophthalmic ointment Muro 128 5 % eye ointment     Allergies:   Codeine, Levofloxacin, Levaquin [levofloxacin in d5w], Neosporin  [neomycin-bacitracin zn-polymyx], Adhesive [tape], Epinephrine, Erythromycin, Guaifenesin, Minocycline, Nickel, Nsaids, and Sulfa antibiotics   Social History   Socioeconomic History   Marital status: Married    Spouse name: Not on file   Number of children: Not on file   Years of education: Not on file   Highest education level: Not on file  Occupational History   Not on file  Tobacco Use   Smoking status: Never   Smokeless tobacco: Never  Substance and Sexual Activity   Alcohol use: No    Alcohol/week: 0.0 standard  drinks of alcohol   Drug use: No   Sexual activity: Not on file  Other Topics Concern   Not on file  Social History Narrative   Not on file   Social Determinants of Health   Financial Resource Strain: Not on file  Food Insecurity: Not on file  Transportation Needs: Not on file  Physical Activity: Not on file  Stress: Not on file  Social Connections: Not on file     Family History: The patient's family history includes Cancer in her mother; Congestive Heart Failure in her mother; Heart failure in her father; Hypertension in her mother; Hypothyroidism in her mother.  ROS:   Please see the history of present illness.    + cough All other systems reviewed and are negative.  Labs/Other Studies Reviewed:    The following studies were reviewed today:  Lexiscan Myoview 09/16/19  Nuclear stress EF: 65%. No T wave inversion was noted during stress. There was no ST segment deviation noted during stress. This is a low risk study.   Normal perfusion. LVEF 65% with normal wall motion. This is a low risk study.   Echo 09/16/19   1. Left ventricular ejection fraction, by estimation, is 60 to 65%. The  left ventricle has normal function. The left ventricle has no regional  wall motion abnormalities. Left ventricular diastolic parameters are  consistent with Grade I diastolic  dysfunction (impaired relaxation).   2. Right ventricular systolic function is normal. The right ventricular  size is normal. Tricuspid regurgitation signal is inadequate for assessing  PA pressure.   3. The mitral valve is normal in structure. No evidence of mitral valve  regurgitation. No evidence of mitral stenosis.   4. The aortic valve is tricuspid. Aortic valve regurgitation is mild.  Mild aortic valve sclerosis is present, with no evidence of aortic valve  stenosis.   5. The inferior vena cava is normal in size with greater than 50%  respiratory variability, suggesting right atrial pressure of 3 mmHg.    Recent Labs: 10/21/2021: ALT 18; BUN 11; Creatinine, Ser 0.78; Hemoglobin 13.4; Platelets 235; Potassium 4.0; Sodium 139  Recent Lipid Panel No results found for: "CHOL", "TRIG", "HDL", "CHOLHDL", "VLDL", "LDLCALC", "LDLDIRECT"   Risk Assessment/Calculations:    CHA2DS2-VASc Score = 3  This indicates a 3.2% annual risk of stroke. The patient's score is based upon: CHF History: 0 HTN History: 0 Diabetes History: 0 Stroke History: 0 Vascular Disease History: 0 Age Score: 2 Gender Score: 1    Physical Exam:    VS:  BP 126/70   Pulse 77   Ht 5' 4.5" (1.638 m)   Wt 164 lb (74.4 kg)   SpO2 94%   BMI 27.72 kg/m     Wt Readings from Last 3 Encounters:  10/23/21 164 lb (74.4 kg)  10/21/21 168 lb 14.4 oz (76.6 kg)  10/21/21 166 lb (75.3 kg)     GEN:  Well nourished, well developed in no acute distress HEENT: Normal NECK: No JVD; No carotid bruits CARDIAC: RRR, no murmurs, rubs, gallops RESPIRATORY:  Coughing. Clear to auscultation without rales, wheezing or rhonchi  ABDOMEN: Soft, non-tender, non-distended MUSCULOSKELETAL:  Chronic bilateral LE edema, L > R. No deformity. 2+ pedal pulses, equal bilaterally SKIN: Warm and dry NEUROLOGIC:  Alert and oriented x 3 PSYCHIATRIC:  Normal affect   EKG:  EKG is ordered today.  The ekg ordered today demonstrates NSR at 77 bpm, no ST abnormality  Diagnoses:    1. Atrial flutter, unspecified type ([place])   2. Hypothyroidism, unspecified type   3. Subacute cough   4. Nonrheumatic aortic (valve) insufficiency    Assessment and Plan:     Atrial flutter versus SVT: Was noted to have HR of 150 during provider appointment on 8/28.  Transferred to ED, multiple EKGs done with question of SVT versus atrial flutter due to consistency of HR 150 bpm.  She was started on Eliquis 5 mg twice daily for stroke prevention and metoprolol succinate 25 mg daily for rate control.  CHA2DS2-VASc score of 3 (2 points age, sex). EKG today reveals sinus  rhythm at 77 bpm. Lengthy discussion  about potential causes of a fib and management.  We will place a 14-day monitor for evaluation of arrhythmia.  We will check TSH to r/o thyroid dysfunction that may be contributing. Advised her to continue Eliquis and metoprolol at this time. We will plan for follow-up with A-fib clinic if monitor confirms atrial fibrillation/flutter. I am asking for review of EKGs by one of our EP physicians and will notify patient of that interpretation.   Aortic valve insufficiency: Mild AI on echo 08/2019.  She reports history of MVP.  Mitral valve was normal in structure with no evidence of prolapse, regurgitation, or stenosis on echo 08/2019.  No indication to repeat imaging at this time.  Cough: Dry nonproductive cough x2 weeks.  No wheezing, rales or crackles on exam. Advised her to follow-up with pulmonology.   Disposition: 2 months with A Fib Clinic  Medication Adjustments/Labs and Tests Ordered: Current medicines are reviewed at length with the patient today.  Concerns regarding medicines are outlined above.  Orders Placed This Encounter  Procedures   TSH   Amb Referral to AFIB Clinic   LONG TERM MONITOR (3-14 DAYS)   EKG 12-Lead   No orders of the defined types were placed in this encounter.   Patient Instructions  Medication Instructions:   Your physician recommends that you continue on your current medications as directed. Please refer to the Current Medication list given to you today.   *If you need a refill on your cardiac medications before your next appointment, please call your pharmacy*   Lab Work:  TODAY!!!! TSH  If you have labs (blood work) drawn today and your tests are completely normal, you will receive your results only by: [place] (if you have MyChart) OR A paper copy in the mail If you have any lab test that is abnormal or we need to change your treatment, we will call you to review the results.   Testing/Procedures:  [place]- Long Term Monitor Instructions  Your physician has requested you wear a ZIO patch monitor for 14 days.  This is a single patch monitor. [employer] supplies one patch monitor per enrollment. Additional stickers are not available. Please do not apply patch if you will be having a Nuclear Stress Test,  Echocardiogram, Cardiac CT, MRI, or Chest Xray during the period you would be wearing the  monitor. The patch cannot be worn during these tests. You cannot remove and re-apply the  ZIO XT patch monitor.  Your ZIO patch monitor will be mailed 3 day USPS to your address on file. It may take 3-5 days  to receive your monitor after you have been enrolled.  Once you have received your monitor, please review the enclosed instructions. Your monitor  has already been registered assigning a specific monitor serial # to you.  Billing and Patient Assistance Program Information  We have supplied [employer] with any of your insurance information on file for billing purposes. [employer] offers a sliding scale Patient Assistance Program for patients that do not have  insurance, or whose insurance does not completely cover the cost of the ZIO monitor.  You must apply for the Patient Assistance Program to qualify for this discounted rate.  To apply, please call [employer] at [phone], select option 4, select option 2, ask to apply for  Patient Assistance Program. Theodore Demark will ask your household income, and how many people  are in your household. They will quote your out-of-pocket cost based on that information.  [employer] will also  be able to set up a 70-month interest-free payment plan if needed.  Applying the monitor   Shave hair from upper left chest.  Hold abrader disc by orange tab. Rub abrader in 40 strokes over the upper left chest as  indicated in your monitor instructions.  Clean area with 4 enclosed alcohol pads. Let dry.  Apply patch as indicated in monitor instructions. Patch will be placed under  collarbone on left  side of chest with arrow pointing upward.  Rub patch adhesive wings for 2 minutes. Remove white label marked "1". Remove the white  label marked "2". Rub patch adhesive wings for 2 additional minutes.  While looking in a mirror, press and release button in center of patch. A small green light will  flash 3-4 times. This will be your only indicator that the monitor has been turned on.  Do not shower for the first 24 hours. You may shower after the first 24 hours.  Press the button if you feel a symptom. You will hear a small click. Record Date, Time and  Symptom in the Patient Logbook.  When you are ready to remove the patch, follow instructions on the last 2 pages of Patient  Logbook. Stick patch monitor onto the last page of Patient Logbook.  Place Patient Logbook in the blue and white box. Use locking tab on box and tape box closed  securely. The blue and white box has prepaid postage on it. Please place it in the mailbox as  soon as possible. Your physician should have your test results approximately 7 days after the  monitor has been [facility]  Call [place] [phone] you have questions regarding  your ZIO XT patch monitor. Call them immediately if you see an orange light blinking on your  monitor.  If your monitor falls off in less than 4 days, contact our Monitor department at [phone]  If your monitor becomes loose or falls off after 4 days call [employer] at [phone]  suggestions on securing your monitor  AFIB CLINIC INFORMATION: Your appointment is scheduled on: Tuesday, October at 2:00 pm. Please arrive 15 minutes early for check-in. The AFib Clinic is located in [facility] Parking instructions/directions: [address] (off [address]. When you pull in to Entrance C, there is an underground parking garage to your right. The code to enter the  garage is 1504. Take the elevators to the first floor. Follow the signs to the Heart and Vascular Specialty Clinics. You will see registration at the end of the hallway.  Phone number: [phone]  Follow-Up: [facility] you and your health needs are our priority.  As part of our continuing mission to provide you with exceptional heart care, we have created designated Provider Care Teams.  These Care Teams include your primary Cardiologist (physician) and Advanced Practice Providers (APPs -  Physician Assistants and Nurse Practitioners) who all work together to provide you with the care you need, when you need it.  DASH Eating Plan DASH stands for Dietary Approaches to Stop Hypertension. The DASH eating plan is a healthy eating plan that has been shown to: Reduce high blood pressure (hypertension). Reduce your risk for type 2 diabetes, heart disease, and stroke. Help with weight loss. What are tips for following this plan? Reading food labels Check food labels for the amount of salt (sodium) per serving. Choose foods with less than  5 percent of the Daily Value of sodium. Generally, foods with less than 300 milligrams (mg) of sodium per serving fit into this eating plan. To find whole grains, look for the word "whole" as the first word in the ingredient list. Shopping Buy products labeled as "low-sodium" or "no salt added." Buy fresh foods. Avoid canned foods and pre-made or frozen meals. Cooking Avoid adding salt when cooking. Use salt-free seasonings or herbs instead of table salt or sea salt. Check with your health care provider or pharmacist before using salt substitutes. Do not fry foods. Cook foods using healthy methods such as baking, boiling, grilling, roasting, and broiling instead. Cook with heart-healthy oils, such as olive, canola, avocado, soybean, or sunflower oil. Meal planning  Eat a balanced diet that includes: 4 or more servings of fruits and 4 or more  servings of vegetables each day. Try to fill one-half of your plate with fruits and vegetables. 6-8 servings of whole grains each day. Less than 6 oz (170 g) of lean meat, poultry, or fish each day. A 3-oz (85-g) serving of meat is about the same size as a deck of cards. One egg equals 1 oz (28 g). 2-3 servings of low-fat dairy each day. One serving is 1 cup (237 mL). 1 serving of nuts, seeds, or beans 5 times each week. 2-3 servings of heart-healthy fats. Healthy fats called omega-3 fatty acids are found in foods such as walnuts, flaxseeds, fortified milks, and eggs. These fats are also found in cold-water fish, such as sardines, salmon, and mackerel. Limit how much you eat of: Canned or prepackaged foods. Food that is high in trans fat, such as some fried foods. Food that is high in saturated fat, such as fatty meat. Desserts and other sweets, sugary drinks, and other foods with added sugar. Full-fat dairy products. Do not salt foods before eating. Do not eat more than 4 egg yolks a week. Try to eat at least 2 vegetarian meals a week. Eat more home-cooked food and less restaurant, buffet, and fast food. Lifestyle When eating at a restaurant, ask that your food be prepared with less salt or no salt, if possible. If you drink alcohol: Limit how much you use to: 0-1 drink a day for women who are not pregnant. 0-2 drinks a day for men. Be aware of how much alcohol is in your drink. In the [place] one drink equals one 12 oz bottle of beer (355 mL), one 5 oz glass of wine (148 mL), or one 1 oz glass of hard liquor (44 mL). General information Avoid eating more than 2,300 mg of salt a day. If you have hypertension, you may need to reduce your sodium intake to 1,500 mg a day. Work with your health care provider to maintain a healthy body weight or to lose weight. Ask what an ideal weight is for you. Get at least 30 minutes of exercise that causes your heart to beat faster (aerobic exercise) most  days of the week. Activities may include walking, swimming, or biking. Work with your health care provider or dietitian to adjust your eating plan to your individual calorie needs. What foods should I eat? Fruits All fresh, dried, or frozen fruit. Canned fruit in natural juice (without added sugar). Vegetables Fresh or frozen vegetables (raw, steamed, roasted, or grilled). Low-sodium or reduced-sodium tomato and vegetable juice. Low-sodium or reduced-sodium tomato sauce and tomato paste. Low-sodium or reduced-sodium canned vegetables. Grains Whole-grain or whole-wheat bread. Whole-grain or whole-wheat pasta. Brown rice.  [place]. Bulgur. Whole-grain and low-sodium cereals. Pita bread. Low-fat, low-sodium crackers. Whole-wheat flour tortillas. Meats and other proteins Skinless chicken or [place]. Ground chicken or [place]. Pork with fat trimmed off. Fish and seafood. Egg whites. Dried beans, peas, or lentils. Unsalted nuts, nut butters, and seeds. Unsalted canned beans. Lean cuts of beef with fat trimmed off. Low-sodium, lean precooked or cured meat, such as sausages or meat loaves. Dairy Low-fat (1%) or fat-free (skim) milk. Reduced-fat, low-fat, or fat-free cheeses. Nonfat, low-sodium ricotta or cottage cheese. Low-fat or nonfat yogurt. Low-fat, low-sodium cheese. Fats and oils Soft margarine without trans fats. Vegetable oil. Reduced-fat, low-fat, or light mayonnaise and salad dressings (reduced-sodium). Canola, safflower, olive, avocado, soybean, and sunflower oils. Avocado. Seasonings and condiments Herbs. Spices. Seasoning mixes without salt. Other foods Unsalted popcorn and pretzels. Fat-free sweets. The items listed above may not be a complete list of foods and beverages you can eat. Contact a dietitian for more information. What foods should I avoid? Fruits Canned fruit in a light or heavy syrup. Fried fruit. Fruit in cream or butter sauce. Vegetables Creamed or fried vegetables.  Vegetables in a cheese sauce. Regular canned vegetables (not low-sodium or reduced-sodium). Regular canned tomato sauce and paste (not low-sodium or reduced-sodium). Regular tomato and vegetable juice (not low-sodium or reduced-sodium). Angie Fava. Olives. Grains Baked goods made with fat, such as croissants, muffins, or some breads. Dry pasta or rice meal packs. Meats and other proteins Fatty cuts of meat. Ribs. Fried meat. Berniece Salines. Bologna, salami, and other precooked or cured meats, such as sausages or meat loaves. Fat from the back of a pig (fatback). Bratwurst. Salted nuts and seeds. Canned beans with added salt. Canned or smoked fish. Whole eggs or egg yolks. Chicken or [place] with skin. Dairy Whole or 2% milk, cream, and half-and-half. Whole or full-fat cream cheese. Whole-fat or sweetened yogurt. Full-fat cheese. Nondairy creamers. Whipped toppings. Processed cheese and cheese spreads. Fats and oils Butter. Stick margarine. Lard. Shortening. Ghee. Bacon fat. Tropical oils, such as coconut, palm kernel, or palm oil. Seasonings and condiments Onion salt, garlic salt, seasoned salt, table salt, and sea salt. Worcestershire sauce. Tartar sauce. Barbecue sauce. Teriyaki sauce. Soy sauce, including reduced-sodium. Steak sauce. Canned and packaged gravies. Fish sauce. Oyster sauce. Cocktail sauce. Store-bought horseradish. Ketchup. Mustard. Meat flavorings and tenderizers. Bouillon cubes. Hot sauces. Pre-made or packaged marinades. Pre-made or packaged taco seasonings. Relishes. Regular salad dressings. Other foods Salted popcorn and pretzels. The items listed above may not be a complete list of foods and beverages you should avoid. Contact a dietitian for more information. Where to find more information National Heart, Lung, and Blood Institute: https://wilson-eaton.com/ American Heart Association: www.heart.org Academy of Nutrition and Dietetics: www.eatright.[place]:  www.kidney.org Summary The DASH eating plan is a healthy eating plan that has been shown to reduce high blood pressure (hypertension). It may also reduce your risk for type 2 diabetes, heart disease, and stroke. When on the DASH eating plan, aim to eat more fresh fruits and vegetables, whole grains, lean proteins, low-fat dairy, and heart-healthy fats. With the DASH eating plan, you should limit salt (sodium) intake to 2,300 mg a day. If you have hypertension, you may need to reduce your sodium intake to 1,500 mg a day. Work with your health care provider or dietitian to adjust your eating plan to your individual calorie needs. This information is not intended to replace advice given to you by your health care provider. Make sure you discuss any questions  you have with your health care provider. Document Revised: 01/14/2019 Document Reviewed: 01/14/2019 Elsevier Patient Education  [place].   We recommend signing up for the patient portal called "MyChart".  Sign up information is provided on this After Visit Summary.  MyChart is used to connect with patients for Virtual Visits (Telemedicine).  Patients are able to view lab/test results, encounter notes, upcoming appointments, etc.  Non-urgent messages can be sent to your provider as well.   To learn more about what you can do with MyChart, go to [email or web address].     Important Information About Sugar         Signed, Emmaline Life, NP  10/23/2021 6:06 PM    [place]

## 2021-10-23 NOTE — Patient Instructions (Addendum)
Medication Instructions:   Your physician recommends that you continue on your current medications as directed. Please refer to the Current Medication list given to you today.   *If you need a refill on your cardiac medications before your next appointment, please call your pharmacy*   Lab Work:  TODAY!!!! TSH  If you have labs (blood work) drawn today and your tests are completely normal, you will receive your results only by: [place] (if you have MyChart) OR A paper copy in the mail If you have any lab test that is abnormal or we need to change your treatment, we will call you to review the results.   Testing/Procedures:  [facility]- Long Term Monitor Instructions  Your physician has requested you wear a ZIO patch monitor for 14 days.  This is a single patch monitor. [employer] supplies one patch monitor per enrollment. Additional stickers are not available. Please do not apply patch if you will be having a Nuclear Stress Test,  Echocardiogram, Cardiac CT, MRI, or Chest Xray during the period you would be wearing the  monitor. The patch cannot be worn during these tests. You cannot remove and re-apply the  ZIO XT patch monitor.  Your ZIO patch monitor will be mailed 3 day USPS to your address on file. It may take 3-5 days  to receive your monitor after you have been enrolled.  Once you have received your monitor, please review the enclosed instructions. Your monitor  has already been registered assigning a specific monitor serial # to you.  Billing and Patient Assistance Program Information  We have supplied [employer] with any of your insurance information on file for billing purposes. [employer] offers a sliding scale Patient Assistance Program for patients that do not have  insurance, or whose insurance does not completely cover the cost of the ZIO monitor.  You must apply for the Patient Assistance Program to qualify for this discounted rate.  To apply, please call [employer] at  [phone], select option 4, select option 2, ask to apply for  Patient Assistance Program. Theodore Demark will ask your household income, and how many people  are in your household. They will quote your out-of-pocket cost based on that information.  [employer] will also be able to set up a 81-month interest-free payment plan if needed.  Applying the monitor   Shave hair from upper left chest.  Hold abrader disc by orange tab. Rub abrader in 40 strokes over the upper left chest as  indicated in your monitor instructions.  Clean area with 4 enclosed alcohol pads. Let dry.  Apply patch as indicated in monitor instructions. Patch will be placed under collarbone on left  side of chest with arrow pointing upward.  Rub patch adhesive wings for 2 minutes. Remove white label marked "1". Remove the white  label marked "2". Rub patch adhesive wings for 2 additional minutes.  While looking in a mirror, press and release button in center of patch. A small green light will  flash 3-4 times. This will be your only indicator that the monitor has been turned on.  Do not shower for the first 24 hours. You may shower after the first 24 hours.  Press the button if you feel a symptom. You will hear a small click. Record Date, Time and  Symptom in the Patient Logbook.  When you are ready to remove the patch, follow instructions on the last 2 pages of Patient  Logbook. Stick patch monitor onto the last page of Patient  Logbook.  Place Patient Logbook in the blue and white box. Use locking tab on box and tape box closed  securely. The blue and white box has prepaid postage on it. Please place it in the mailbox as  soon as possible. Your physician should have your test results approximately 7 days after the  monitor has been mailed back to [facility].  Call [place] at [phone] if you have questions regarding  your ZIO XT patch monitor. Call them immediately if you see an orange light  blinking on your  monitor.  If your monitor falls off in less than 4 days, contact our Monitor department at [phone].  If your monitor becomes loose or falls off after 4 days call [employer] at [phone] for  suggestions on securing your monitor  [facility] CLINIC INFORMATION: Your appointment is scheduled on: Tuesday, October at 2:00 pm. Please arrive 15 minutes early for check-in. The [facility] Clinic is located in the [facility] Clinics at [facility]. Parking instructions/directions: [address] (off [address]). When you pull in to Entrance C, there is an underground parking garage to your right. The code to enter the garage is 1504. Take the elevators to the first floor. Follow the signs to the Heart and Vascular Specialty Clinics. You will see registration at the end of the hallway.  Phone number: [phone]   Follow-Up: At [facility], you and your health needs are our priority.  As part of our continuing mission to provide you with exceptional heart care, we have created designated Provider Care Teams.  These Care Teams include your primary Cardiologist (physician) and Advanced Practice Providers (APPs -  Physician Assistants and Nurse Practitioners) who all work together to provide you with the care you need, when you need it.  DASH Eating Plan DASH stands for Dietary Approaches to Stop Hypertension. The DASH eating plan is a healthy eating plan that has been shown to: Reduce high blood pressure (hypertension). Reduce your risk for type 2 diabetes, heart disease, and stroke. Help with weight loss. What are tips for following this plan? Reading food labels Check food labels for the amount of salt (sodium) per serving. Choose foods with less than 5 percent of the Daily Value of sodium. Generally, foods with less than 300 milligrams (mg) of sodium per serving fit into this eating plan. To find whole grains, look for the word "whole" as the first  word in the ingredient list. Shopping Buy products labeled as "low-sodium" or "no salt added." Buy fresh foods. Avoid canned foods and pre-made or frozen meals. Cooking Avoid adding salt when cooking. Use salt-free seasonings or herbs instead of table salt or sea salt. Check with your health care provider or pharmacist before using salt substitutes. Do not fry foods. Cook foods using healthy methods such as baking, boiling, grilling, roasting, and broiling instead. Cook with heart-healthy oils, such as olive, canola, avocado, soybean, or sunflower oil. Meal planning  Eat a balanced diet that includes: 4 or more servings of fruits and 4 or more servings of vegetables each day. Try to fill one-half of your plate with fruits and vegetables. 6-8 servings of whole grains each day. Less than 6 oz (170 g) of lean meat, poultry, or fish each day. A 3-oz (85-g) serving of meat is about the same size as a deck of cards. One egg equals 1 oz (28 g). 2-3 servings of low-fat dairy each day. One serving is 1 cup (237 mL).  1 serving of nuts, seeds, or beans 5 times each week. 2-3 servings of heart-healthy fats. Healthy fats called omega-3 fatty acids are found in foods such as walnuts, flaxseeds, fortified milks, and eggs. These fats are also found in cold-water fish, such as sardines, salmon, and mackerel. Limit how much you eat of: Canned or prepackaged foods. Food that is high in trans fat, such as some fried foods. Food that is high in saturated fat, such as fatty meat. Desserts and other sweets, sugary drinks, and other foods with added sugar. Full-fat dairy products. Do not salt foods before eating. Do not eat more than 4 egg yolks a week. Try to eat at least 2 vegetarian meals a week. Eat more home-cooked food and less restaurant, buffet, and fast food. Lifestyle When eating at a restaurant, ask that your food be prepared with less salt or no salt, if possible. If you drink alcohol: Limit how  much you use to: 0-1 drink a day for women who are not pregnant. 0-2 drinks a day for men. Be aware of how much alcohol is in your drink. In the [place] one drink equals one 12 oz bottle of beer (355 mL), one 5 oz glass of wine (148 mL), or one 1 oz glass of hard liquor (44 mL). General information Avoid eating more than 2,300 mg of salt a day. If you have hypertension, you may need to reduce your sodium intake to 1,500 mg a day. Work with your health care provider to maintain a healthy body weight or to lose weight. Ask what an ideal weight is for you. Get at least 30 minutes of exercise that causes your heart to beat faster (aerobic exercise) most days of the week. Activities may include walking, swimming, or biking. Work with your health care provider or dietitian to adjust your eating plan to your individual calorie needs. What foods should I eat? Fruits All fresh, dried, or frozen fruit. Canned fruit in natural juice (without added sugar). Vegetables Fresh or frozen vegetables (raw, steamed, roasted, or grilled). Low-sodium or reduced-sodium tomato and vegetable juice. Low-sodium or reduced-sodium tomato sauce and tomato paste. Low-sodium or reduced-sodium canned vegetables. Grains Whole-grain or whole-wheat bread. Whole-grain or whole-wheat pasta. Brown rice. [place] Morrow. Bulgur. Whole-grain and low-sodium cereals. Pita bread. Low-fat, low-sodium crackers. Whole-wheat flour tortillas. Meats and other proteins Skinless chicken or [place]. Ground chicken or [place]. Pork with fat trimmed off. Fish and seafood. Egg whites. Dried beans, peas, or lentils. Unsalted nuts, nut butters, and seeds. Unsalted canned beans. Lean cuts of beef with fat trimmed off. Low-sodium, lean precooked or cured meat, such as sausages or meat loaves. Dairy Low-fat (1%) or fat-free (skim) milk. Reduced-fat, low-fat, or fat-free cheeses. Nonfat, low-sodium ricotta or cottage cheese. Low-fat or nonfat yogurt. Low-fat,  low-sodium cheese. Fats and oils Soft margarine without trans fats. Vegetable oil. Reduced-fat, low-fat, or light mayonnaise and salad dressings (reduced-sodium). Canola, safflower, olive, avocado, soybean, and sunflower oils. Avocado. Seasonings and condiments Herbs. Spices. Seasoning mixes without salt. Other foods Unsalted popcorn and pretzels. Fat-free sweets. The items listed above may not be a complete list of foods and beverages you can eat. Contact a dietitian for more information. What foods should I avoid? Fruits Canned fruit in a light or heavy syrup. Fried fruit. Fruit in cream or butter sauce. Vegetables Creamed or fried vegetables. Vegetables in a cheese sauce. Regular canned vegetables (not low-sodium or reduced-sodium). Regular canned tomato sauce and paste (not low-sodium or reduced-sodium). Regular tomato and vegetable  juice (not low-sodium or reduced-sodium). Angie Fava. Olives. Grains Baked goods made with fat, such as croissants, muffins, or some breads. Dry pasta or rice meal packs. Meats and other proteins Fatty cuts of meat. Ribs. Fried meat. Berniece Salines. Bologna, salami, and other precooked or cured meats, such as sausages or meat loaves. Fat from the back of a pig (fatback). Bratwurst. Salted nuts and seeds. Canned beans with added salt. Canned or smoked fish. Whole eggs or egg yolks. Chicken or [place] with skin. Dairy Whole or 2% milk, cream, and half-and-half. Whole or full-fat cream cheese. Whole-fat or sweetened yogurt. Full-fat cheese. Nondairy creamers. Whipped toppings. Processed cheese and cheese spreads. Fats and oils Butter. Stick margarine. Lard. Shortening. Ghee. Bacon fat. Tropical oils, such as coconut, palm kernel, or palm oil. Seasonings and condiments Onion salt, garlic salt, seasoned salt, table salt, and sea salt. Worcestershire sauce. Tartar sauce. Barbecue sauce. Teriyaki sauce. Soy sauce, including reduced-sodium. Steak sauce. Canned and packaged gravies.  Fish sauce. Oyster sauce. Cocktail sauce. Store-bought horseradish. Ketchup. Mustard. Meat flavorings and tenderizers. Bouillon cubes. Hot sauces. Pre-made or packaged marinades. Pre-made or packaged taco seasonings. Relishes. Regular salad dressings. Other foods Salted popcorn and pretzels. The items listed above may not be a complete list of foods and beverages you should avoid. Contact a dietitian for more information. Where to find more information National Heart, Lung, and Blood Institute: https://wilson-eaton.com/ American Heart Association: www.heart.org Academy of Nutrition and Dietetics: www.eatright.[place]: www.kidney.org Summary The DASH eating plan is a healthy eating plan that has been shown to reduce high blood pressure (hypertension). It may also reduce your risk for type 2 diabetes, heart disease, and stroke. When on the DASH eating plan, aim to eat more fresh fruits and vegetables, whole grains, lean proteins, low-fat dairy, and heart-healthy fats. With the DASH eating plan, you should limit salt (sodium) intake to 2,300 mg a day. If you have hypertension, you may need to reduce your sodium intake to 1,500 mg a day. Work with your health care provider or dietitian to adjust your eating plan to your individual calorie needs. This information is not intended to replace advice given to you by your health care provider. Make sure you discuss any questions you have with your health care provider. Document Revised: 01/14/2019 Document Reviewed: 01/14/2019 Elsevier Patient Education  [place].   We recommend signing up for the patient portal called "MyChart".  Sign up information is provided on this After Visit Summary.  MyChart is used to connect with patients for Virtual Visits (Telemedicine).  Patients are able to view lab/test results, encounter notes, upcoming appointments, etc.  Non-urgent messages can be sent to your provider as well.   To learn more  about what you can do with MyChart, go to [email or web address].     Important Information About Sugar

## 2021-10-24 LAB — TSH: TSH: 0.698 u[IU]/mL (ref 0.450–4.500)

## 2021-10-29 ENCOUNTER — Encounter: Payer: Self-pay | Admitting: Physical Therapy

## 2021-10-29 ENCOUNTER — Ambulatory Visit: Payer: Medicare Other | Attending: Internal Medicine | Admitting: Physical Therapy

## 2021-10-29 DIAGNOSIS — G8929 Other chronic pain: Secondary | ICD-10-CM | POA: Diagnosis present

## 2021-10-29 DIAGNOSIS — M545 Low back pain, unspecified: Secondary | ICD-10-CM | POA: Insufficient documentation

## 2021-10-29 DIAGNOSIS — R600 Localized edema: Secondary | ICD-10-CM | POA: Insufficient documentation

## 2021-10-29 DIAGNOSIS — M6281 Muscle weakness (generalized): Secondary | ICD-10-CM | POA: Insufficient documentation

## 2021-10-29 DIAGNOSIS — R262 Difficulty in walking, not elsewhere classified: Secondary | ICD-10-CM | POA: Insufficient documentation

## 2021-10-29 DIAGNOSIS — M25572 Pain in left ankle and joints of left foot: Secondary | ICD-10-CM | POA: Insufficient documentation

## 2021-10-29 DIAGNOSIS — M25552 Pain in left hip: Secondary | ICD-10-CM | POA: Insufficient documentation

## 2021-10-29 NOTE — Therapy (Signed)
[address] [place], [place], [address] Phone: [phone]   Fax:  [phone]  Physical Therapy Treatment  Patient Details  Name: Brooke Jimenez MRN: [id number] Date of Birth: [birth year] Referring Provider (PT): Rolena Infante   Encounter Date: 10/29/2021   PT End of Session - 10/29/21 1028     Visit Number [id number]    Date for PT Re-Evaluation 10/29/21    Authorization Type Medicare    PT Start Time 4268    PT Stop Time 1100    PT Time Calculation (min) 45 min    Activity Tolerance Patient tolerated treatment well    Behavior During Therapy [facility] for tasks assessed/performed             Past Medical History:  Diagnosis Date   Acne rosacea    Anxiety    Arthritis    arthritis in hip    Bronchiectasis ([place])    Cataract    beginning   Complication of anesthesia    "really sore throat"   Corneal dystrophy    DDD (degenerative disc disease), cervical    and lower back   Diverticulosis    Dysphagia    chronic- please see ultrasound done 4/16 15 in [facility]    Dysrhythmia    sometimes patient has extra beats per patient    Esophageal dysmotility    Family history of adverse reaction to anesthesia    mother slow to wake up    GERD (gastroesophageal reflux disease)    under control   Glaucoma    "suspect"   Gout    one finger x1 attack   H/O hiatal hernia    Hearing loss    bilateral due to nerve loss. wears hearing aides   Heart murmur    no problem    History of chicken pox    History of melanoma    melanoma- 1986 and 1997    Hypothyroidism    Low back pain with sciatica    Measles    hx of   Mitral valve prolapse    Nocturia    Osteoporosis    Peripheral neuropathy    feet   PONV (postoperative nausea and vomiting)    PTSD (post-traumatic stress disorder) 2006   RSD (reflex sympathetic dystrophy) 1993   Spider veins    Urinary incontinence    occasional   Varicose veins     Past Surgical  History:  Procedure Laterality Date   ESOPHAGEAL MANOMETRY N/A 12/17/2015   Procedure: ESOPHAGEAL MANOMETRY (EM);  Surgeon: Mauri Pole, MD;  Location: [facility];  Service: Endoscopy;  Laterality: N/A;   HARDWARE REMOVAL Left 10/17/2013   Procedure: HARDWARE REMOVAL LEFT HIP;  Surgeon: Gearlean Alf, MD;  Location: [facility] OR[facility];  Service: Orthopedics;  Laterality: Left;   HIP FRACTURE SURGERY Left 2006   3 screws   INSERTION OF MESH  02/08/2014   Procedure: INSERTION OF MESH;  Surgeon: Jackolyn Confer, MD;  Location: [facility];  Service: General;;   melanoma surgery   1986, 1997   PARATHYROIDECTOMY  04/02/15   [place] IMPEDANCE STUDY N/A 12/17/2015   Procedure: [place] IMPEDANCE STUDY;  Surgeon: Mauri Pole, MD;  Location: [facility];  Service: Endoscopy;  Laterality: N/A;   TOTAL HIP ARTHROPLASTY Left 04/10/2014   Procedure: LEFT TOTAL HIP ARTHROPLASTY ANTERIOR APPROACH;  Surgeon: Gearlean Alf, MD;  Location: [facility];  Service: Orthopedics;  Laterality: Left;   UMBILICAL HERNIA REPAIR N/A  02/08/2014   Procedure: HERNIA REPAIR UMBILICAL ADULT WITH MESH;  Surgeon: Jackolyn Confer, MD;  Location: [facility];  Service: General;  Laterality: N/A;    There were no vitals filed for this visit.   Subjective Assessment - 10/29/21 1030     Subjective Patient cancelled a few appointments with [place] due to death in family and then was hospitalized due to a fast heart rate, it was elevated for about 12-14 hours, she is now on a monitor, she is very tearful due to "not knowing what to do", she reports that she is doing well with weight loss but now is gaining weight    Currently in Pain? No/denies                                          PT Short Term Goals - 03/21/21 1005       PT SHORT TERM GOAL #1   Title independent with HEP    Status Achieved               PT Long Term Goals - 10/29/21 1035       PT LONG TERM GOAL #1   Title go up and down stairs step  over step consistently    Status Achieved      PT LONG TERM GOAL #2   Title return to independent gym program    Status Partially Met      PT LONG TERM GOAL #3   Title walk without device with minimal deviation    Status Achieved      PT LONG TERM GOAL #4   Title report pain decreased 50%    Status Partially Met      PT LONG TERM GOAL #5   Title decrease TUG time to 13 seconds    Status Achieved                   Plan - 10/29/21 1036     Clinical Impression Statement Patient very tearful today with new dx of atrial flutter, she has a lot of concers and questions.  WE came up with a list of quesitons for her MD and agreed that she will call him today.  She has concerns about exercise and HR and recovery time as well as her balance, we went over this today and I helped her with questions for her MD that I would want to know as her treating PT and I would wan to know if I were the patient.    PT Frequency 1x / week    PT Duration 4 weeks    PT Next Visit Plan she will call MD, if okay by MD I will set her up on an indpendent routine    Consulted and Agree with Plan of Care Patient             Patient will benefit from skilled therapeutic intervention in order to improve the following deficits and impairments:  Difficulty walking, Abnormal gait, Cardiopulmonary status limiting activity, Decreased endurance, Pain, Decreased activity tolerance, Decreased balance, Impaired flexibility, Decreased strength, Decreased mobility  Visit Diagnosis: Muscle weakness (generalized)  Difficulty in walking, not elsewhere classified  Chronic bilateral low back pain without sciatica  Pain in left hip  Localized edema  Pain in left ankle and joints of left foot     Problem List Patient Active Problem List  Diagnosis Date Noted   Upper respiratory infection 01/15/2018   Postherpetic neuralgia 12/25/2016   Dysesthesia 12/25/2016   Polyneuropathy 12/25/2016   Exposure to  influenza 04/01/2016   Dysphagia    Bronchiectasis without acute exacerbation ([place]) 08/30/2015   Bronchiectasis ([place]) 08/14/2015   Chronic rhinitis 08/14/2015   Avascular necrosis of femur head, left ([place]) 04/10/2014   OA (osteoarthritis) of hip 04/10/2014   Hypothyroidism 12/09/2013   Painful orthopaedic hardware ([place]) 10/17/2013    Sumner Boast, PT 10/29/2021, 11:57 AM  [address]. [place], [place], [address] Phone: [phone]   Fax:  [phone]  Name: Brooke Jimenez MRN: [id number] Date of Birth: 1945/05/08

## 2021-10-31 ENCOUNTER — Ambulatory Visit: Payer: Medicare Other | Admitting: Physical Therapy

## 2021-10-31 ENCOUNTER — Encounter: Payer: Self-pay | Admitting: Physical Therapy

## 2021-10-31 DIAGNOSIS — M6281 Muscle weakness (generalized): Secondary | ICD-10-CM

## 2021-10-31 DIAGNOSIS — M545 Low back pain, unspecified: Secondary | ICD-10-CM

## 2021-10-31 DIAGNOSIS — R262 Difficulty in walking, not elsewhere classified: Secondary | ICD-10-CM

## 2021-10-31 NOTE — Therapy (Signed)
[facility]. [place], [place], [address] Phone: [phone]   Fax:  [phone]  Physical Therapy Treatment  Patient Details  Name: Brooke Jimenez MRN: [id number] Date of Birth: Jun 14, 1945 Referring Provider (PT): Rolena Infante   Encounter Date: 10/31/2021   PT End of Session - 10/31/21 1115     Visit Number 21    Date for PT Re-Evaluation 11/30/21    Authorization Type Medicare    PT Start Time 1100    PT Stop Time 1145    PT Time Calculation (min) 45 min    Activity Tolerance Patient tolerated treatment well    Behavior During Therapy [facility] for tasks assessed/performed             Past Medical History:  Diagnosis Date   Acne rosacea    Anxiety    Arthritis    arthritis in hip    Bronchiectasis ([place])    Cataract    beginning   Complication of anesthesia    "really sore throat"   Corneal dystrophy    DDD (degenerative disc disease), cervical    and lower back   Diverticulosis    Dysphagia    chronic- please see ultrasound done 4/16 15 in EPIC    Dysrhythmia    sometimes patient has extra beats per patient    Esophageal dysmotility    Family history of adverse reaction to anesthesia    mother slow to wake up    GERD (gastroesophageal reflux disease)    under control   Glaucoma    "suspect"   Gout    one finger x1 attack   H/O hiatal hernia    Hearing loss    bilateral due to nerve loss. wears hearing aides   Heart murmur    no problem    History of chicken pox    History of melanoma    melanoma- 1986 and 1997    Hypothyroidism    Low back pain with sciatica    Measles    hx of   Mitral valve prolapse    Nocturia    Osteoporosis    Peripheral neuropathy    feet   PONV (postoperative nausea and vomiting)    PTSD (post-traumatic stress disorder) 2006   RSD (reflex sympathetic dystrophy) 1993   Spider veins    Urinary incontinence    occasional   Varicose veins     Past Surgical  History:  Procedure Laterality Date   ESOPHAGEAL MANOMETRY N/A 12/17/2015   Procedure: ESOPHAGEAL MANOMETRY (EM);  Surgeon: Mauri Pole, MD;  [facility];  Service: Endoscopy;  Laterality: N/A;   HARDWARE REMOVAL Left 10/17/2013   Procedure: HARDWARE REMOVAL LEFT HIP;  Surgeon: Gearlean Alf, MD;  [facility];  Service: Orthopedics;  Laterality: Left;   HIP FRACTURE SURGERY Left 2006   3 screws   INSERTION OF MESH  02/08/2014   Procedure: INSERTION OF MESH;  Surgeon: Jackolyn Confer, MD;  [facility];  Service: General;;   melanoma surgery   1986, 1997   PARATHYROIDECTOMY  04/02/15   [place] IMPEDANCE STUDY N/A 12/17/2015   Procedure: [place] IMPEDANCE STUDY;  Surgeon: Mauri Pole, MD;  [facility];  Service: Endoscopy;  Laterality: N/A;   TOTAL HIP ARTHROPLASTY Left 04/10/2014   Procedure: LEFT TOTAL HIP ARTHROPLASTY ANTERIOR APPROACH;  Surgeon: Gearlean Alf, MD;  [facility];  Service: Orthopedics;  Laterality: Left;   UMBILICAL HERNIA REPAIR N/A  02/08/2014   Procedure: HERNIA REPAIR UMBILICAL ADULT WITH MESH;  Surgeon: Jackolyn Confer, MD;  [facility];  Service: General;  Laterality: N/A;    There were no vitals filed for this visit.   Subjective Assessment - 10/31/21 1116     Subjective Patient has had some medical complications and has had an ED visit, she now has a heart monitor on for the next week or so, to see if atrial flutter was a cause, She had a lot of quesitons to me her last visit and i helped her formulate some quesitons for her Md    Currently in Pain? No/denies                               [facility] Adult PT Treatment/Exercise - 10/31/21 0001       Lumbar Exercises: Aerobic   UBE (Upper Arm Bike) L 3 3 min fwd/3 mn backward    Nustep level 5 x 8 minutes      Lumbar Exercises: Machines for Strengthening   Leg Press 20# 2x15    Other Lumbar Machine Exercise seated row 20# 2x10, lats 20# 2x10     Other Lumbar Machine Exercise 5# straight arm pulls, black tband back extension 2x10, 20# triceps and 10# biceps                       PT Short Term Goals - 03/21/21 1005       PT SHORT TERM GOAL #1   Title independent with HEP    Status Achieved               PT Long Term Goals - 10/31/21 1116       PT LONG TERM GOAL #1   Title go up and down stairs step over step consistently    Status Achieved      PT LONG TERM GOAL #2   Title return to independent gym program    Status Partially Met      PT LONG TERM GOAL #3   Title walk without device with minimal deviation    Status Achieved      PT LONG TERM GOAL #4   Title report pain decreased 50%    Status Achieved      PT LONG TERM GOAL #5   Title decrease TUG time to 13 seconds    Status Achieved                   Plan - 10/31/21 1247     Clinical Impression Statement I monitored her HR throughout the session, low was 63 and high was 88 bpm, no issues with this and we worked on progressing back to her exerciseing and moving, she will return the heart monitor next week.  We may have something new to go on at that time but she was able to talk to her MD and ask the questions that we came up with, there are no real precautions , MD just gave her max HR  and gave a 70 percentage area to work in, as long as the heart rate recovers in a 5 minute time frame    PT Next Visit Plan set her up on an indpendent routine    Consulted and Agree with Plan of Care Patient             Patient will benefit from skilled therapeutic intervention in  order to improve the following deficits and impairments:  Difficulty walking, Abnormal gait, Cardiopulmonary status limiting activity, Decreased endurance, Pain, Decreased activity tolerance, Decreased balance, Impaired flexibility, Decreased strength, Decreased mobility  Visit Diagnosis: Muscle weakness (generalized)  Difficulty in walking, not elsewhere  classified  Chronic bilateral low back pain without sciatica     Problem List Patient Active Problem List   Diagnosis Date Noted   Upper respiratory infection 01/15/2018   Postherpetic neuralgia 12/25/2016   Dysesthesia 12/25/2016   Polyneuropathy 12/25/2016   Exposure to influenza 04/01/2016   Dysphagia    Bronchiectasis without acute exacerbation ([place]) 08/30/2015   Bronchiectasis ([place]) 08/14/2015   Chronic rhinitis 08/14/2015   Avascular necrosis of femur head, left ([place]) 04/10/2014   OA (osteoarthritis) of hip 04/10/2014   Hypothyroidism 12/09/2013   Painful orthopaedic hardware ([place]) 10/17/2013    Sumner Boast, PT 10/31/2021, 12:49 PM  [facility]. [place], [place], [address] Phone: [phone]   Fax:  [phone]  Name: Brooke Jimenez MRN: [id number] Date of Birth: 09/16/1945

## 2021-11-05 ENCOUNTER — Ambulatory Visit: Payer: Medicare Other | Admitting: Physical Therapy

## 2021-11-05 DIAGNOSIS — M6281 Muscle weakness (generalized): Secondary | ICD-10-CM | POA: Diagnosis not present

## 2021-11-05 NOTE — Therapy (Deleted)
[facility]. [place], [place], [address] Phone: [phone]   Fax:  [phone]  Physical Therapy Treatment  Patient Details  Name: Brooke Jimenez MRN: [id number] Date of Birth: June 05, 1945 Referring Provider (PT): Rolena Infante   Encounter Date: 11/05/2021   PT End of Session - 11/05/21 1103     Visit Number 67    Authorization Type Medicare    PT Start Time 1100    PT Stop Time 1145    PT Time Calculation (min) 45 min             Past Medical History:  Diagnosis Date   Acne rosacea    Anxiety    Arthritis    arthritis in hip    Bronchiectasis ([place])    Cataract    beginning   Complication of anesthesia    "really sore throat"   Corneal dystrophy    DDD (degenerative disc disease), cervical    and lower back   Diverticulosis    Dysphagia    chronic- please see ultrasound done 4/16 15 in EPIC    Dysrhythmia    sometimes patient has extra beats per patient    Esophageal dysmotility    Family history of adverse reaction to anesthesia    mother slow to wake up    GERD (gastroesophageal reflux disease)    under control   Glaucoma    "suspect"   Gout    one finger x1 attack   H/O hiatal hernia    Hearing loss    bilateral due to nerve loss. wears hearing aides   Heart murmur    no problem    History of chicken pox    History of melanoma    melanoma- 1986 and 1997    Hypothyroidism    Low back pain with sciatica    Measles    hx of   Mitral valve prolapse    Nocturia    Osteoporosis    Peripheral neuropathy    feet   PONV (postoperative nausea and vomiting)    PTSD (post-traumatic stress disorder) 2006   RSD (reflex sympathetic dystrophy) 1993   Spider veins    Urinary incontinence    occasional   Varicose veins     Past Surgical History:  Procedure Laterality Date   ESOPHAGEAL MANOMETRY N/A 12/17/2015   Procedure: ESOPHAGEAL MANOMETRY (EM);  Surgeon: Mauri Pole, MD;   [facility];  Service: Endoscopy;  Laterality: N/A;   HARDWARE REMOVAL Left 10/17/2013   Procedure: HARDWARE REMOVAL LEFT HIP;  Surgeon: Gearlean Alf, MD;  [facility];  Service: Orthopedics;  Laterality: Left;   HIP FRACTURE SURGERY Left 2006   3 screws   INSERTION OF MESH  02/08/2014   Procedure: INSERTION OF MESH;  Surgeon: Jackolyn Confer, MD;  [facility];  Service: General;;   melanoma surgery   1986, 1997   PARATHYROIDECTOMY  04/02/15   [place] IMPEDANCE STUDY N/A 12/17/2015   Procedure: [place] IMPEDANCE STUDY;  Surgeon: Mauri Pole, MD;  [facility];  Service: Endoscopy;  Laterality: N/A;   TOTAL HIP ARTHROPLASTY Left 04/10/2014   Procedure: LEFT TOTAL HIP ARTHROPLASTY ANTERIOR APPROACH;  Surgeon: Gearlean Alf, MD;  [facility];  Service: Orthopedics;  Laterality: Left;   UMBILICAL HERNIA REPAIR N/A 02/08/2014   Procedure: HERNIA REPAIR UMBILICAL ADULT WITH MESH;  Surgeon: Jackolyn Confer, MD;  [facility];  Service: General;  Laterality: N/A;  There were no vitals filed for this visit.                                   PT Short Term Goals - 03/21/21 1005       PT SHORT TERM GOAL #1   Title independent with HEP    Status Achieved               PT Long Term Goals - 10/31/21 1116       PT LONG TERM GOAL #1   Title go up and down stairs step over step consistently    Status Achieved      PT LONG TERM GOAL #2   Title return to independent gym program    Status Partially Met      PT LONG TERM GOAL #3   Title walk without device with minimal deviation    Status Achieved      PT LONG TERM GOAL #4   Title report pain decreased 50%    Status Achieved      PT LONG TERM GOAL #5   Title decrease TUG time to 13 seconds    Status Achieved                     Patient will benefit from skilled therapeutic intervention in order to improve the following deficits and impairments:      Visit Diagnosis: Muscle weakness (generalized)     Problem List Patient Active Problem List   Diagnosis Date Noted   Upper respiratory infection 01/15/2018   Postherpetic neuralgia 12/25/2016   Dysesthesia 12/25/2016   Polyneuropathy 12/25/2016   Exposure to influenza 04/01/2016   Dysphagia    Bronchiectasis without acute exacerbation ([place]) 08/30/2015   Bronchiectasis ([place]) 08/14/2015   Chronic rhinitis 08/14/2015   Avascular necrosis of femur head, left ([place]) 04/10/2014   OA (osteoarthritis) of hip 04/10/2014   Hypothyroidism 12/09/2013   Painful orthopaedic hardware ([place]) 10/17/2013    Brooke Jimenez,Brooke Jimenez, PTA 11/05/2021, 11:04 AM  [facility]. [place], [place], [address] Phone: [phone]   Fax:  [phone]  Name: Brooke Jimenez MRN: [id number] Date of Birth: Jul 27, 1945  [facility]. [place], [place], [address] Phone: [phone]   Fax:  [phone]  Physical Therapy Treatment  Patient Details  Name: Brooke Jimenez MRN: [id number] Date of Birth: Mar 15, 1945 Referring Provider (PT): Rolena Infante   Encounter Date: 11/05/2021   PT End of Session - 11/05/21 1103     Visit Number 2    Authorization Type Medicare    PT Start Time 1100    PT Stop Time 1145    PT Time Calculation (min) 45 min             Past Medical History:  Diagnosis Date   Acne rosacea    Anxiety    Arthritis    arthritis in hip    Bronchiectasis ([place])    Cataract    beginning   Complication of anesthesia    "really sore throat"   Corneal dystrophy    DDD (degenerative disc disease), cervical    and lower back   Diverticulosis    Dysphagia    chronic- please see ultrasound done 4/16 15 in EPIC    Dysrhythmia    sometimes patient has extra beats per patient    Esophageal  dysmotility    Family history of adverse reaction to anesthesia    mother slow  to wake up    GERD (gastroesophageal reflux disease)    under control   Glaucoma    "suspect"   Gout    one finger x1 attack   H/O hiatal hernia    Hearing loss    bilateral due to nerve loss. wears hearing aides   Heart murmur    no problem    History of chicken pox    History of melanoma    melanoma- 1986 and 1997    Hypothyroidism    Low back pain with sciatica    Measles    hx of   Mitral valve prolapse    Nocturia    Osteoporosis    Peripheral neuropathy    feet   PONV (postoperative nausea and vomiting)    PTSD (post-traumatic stress disorder) 2006   RSD (reflex sympathetic dystrophy) 1993   Spider veins    Urinary incontinence    occasional   Varicose veins     Past Surgical History:  Procedure Laterality Date   ESOPHAGEAL MANOMETRY N/A 12/17/2015   Procedure: ESOPHAGEAL MANOMETRY (EM);  Surgeon: Mauri Pole, MD;  [facility];  Service: Endoscopy;  Laterality: N/A;   HARDWARE REMOVAL Left 10/17/2013   Procedure: HARDWARE REMOVAL LEFT HIP;  Surgeon: Gearlean Alf, MD;  [facility];  Service: Orthopedics;  Laterality: Left;   HIP FRACTURE SURGERY Left 2006   3 screws   INSERTION OF MESH  02/08/2014   Procedure: INSERTION OF MESH;  Surgeon: Jackolyn Confer, MD;  [facility];  Service: General;;   melanoma surgery   1986, 1997   PARATHYROIDECTOMY  04/02/15   [place] IMPEDANCE STUDY N/A 12/17/2015   Procedure: [place] IMPEDANCE STUDY;  Surgeon: Mauri Pole, MD;  [facility];  Service: Endoscopy;  Laterality: N/A;   TOTAL HIP ARTHROPLASTY Left 04/10/2014   Procedure: LEFT TOTAL HIP ARTHROPLASTY ANTERIOR APPROACH;  Surgeon: Gearlean Alf, MD;  [facility];  Service: Orthopedics;  Laterality: Left;   UMBILICAL HERNIA REPAIR N/A 02/08/2014   Procedure: HERNIA REPAIR UMBILICAL ADULT WITH MESH;  Surgeon: Jackolyn Confer, MD;  [facility];  Service: General;  Laterality: N/A;    There were no vitals filed for this  visit.                                   PT Short Term Goals - 03/21/21 1005       PT SHORT TERM GOAL #1   Title independent with HEP    Status Achieved               PT Long Term Goals - 10/31/21 1116       PT LONG TERM GOAL #1   Title go up and down stairs step over step consistently    Status Achieved      PT LONG TERM GOAL #2   Title return to independent gym program    Status Partially Met      PT LONG TERM GOAL #3   Title walk without device with minimal deviation    Status Achieved      PT LONG TERM GOAL #4   Title report pain decreased 50%    Status Achieved      PT LONG TERM GOAL #5   Title decrease TUG time  to 13 seconds    Status Achieved                     Patient will benefit from skilled therapeutic intervention in order to improve the following deficits and impairments:     Visit Diagnosis: Muscle weakness (generalized)     Problem List Patient Active Problem List   Diagnosis Date Noted   Upper respiratory infection 01/15/2018   Postherpetic neuralgia 12/25/2016   Dysesthesia 12/25/2016   Polyneuropathy 12/25/2016   Exposure to influenza 04/01/2016   Dysphagia    Bronchiectasis without acute exacerbation ([place]) 08/30/2015   Bronchiectasis ([place]) 08/14/2015   Chronic rhinitis 08/14/2015   Avascular necrosis of femur head, left ([place]) 04/10/2014   OA (osteoarthritis) of hip 04/10/2014   Hypothyroidism 12/09/2013   Painful orthopaedic hardware ([place]) 10/17/2013    Alayiah Fontes,Brooke Jimenez, PTA 11/05/2021, 11:04 AM  [place]. [place], [place], [address] Phone: [phone]   Fax:  [phone]  Name: Brooke Jimenez MRN: [id number] Date of Birth: 07-09-1945  [place]. [place], [place], [address] Phone: [phone]   Fax:  [phone]

## 2021-11-05 NOTE — Therapy (Signed)
[facility]. [place], [place], [address] Phone: [phone]   Fax:  [phone]  Physical Therapy Treatment  Patient Details  Name: Brooke Jimenez MRN: [id number] Date of Birth: June 05, 1945 Referring Provider (PT): Rolena Infante   Encounter Date: 11/05/2021   PT End of Session - 11/05/21 1103     Visit Number 67    Authorization Type Medicare    PT Start Time 1100    PT Stop Time 1145    PT Time Calculation (min) 45 min             Past Medical History:  Diagnosis Date   Acne rosacea    Anxiety    Arthritis    arthritis in hip    Bronchiectasis ([place])    Cataract    beginning   Complication of anesthesia    "really sore throat"   Corneal dystrophy    DDD (degenerative disc disease), cervical    and lower back   Diverticulosis    Dysphagia    chronic- please see ultrasound done 4/16 15 in EPIC    Dysrhythmia    sometimes patient has extra beats per patient    Esophageal dysmotility    Family history of adverse reaction to anesthesia    mother slow to wake up    GERD (gastroesophageal reflux disease)    under control   Glaucoma    "suspect"   Gout    one finger x1 attack   H/O hiatal hernia    Hearing loss    bilateral due to nerve loss. wears hearing aides   Heart murmur    no problem    History of chicken pox    History of melanoma    melanoma- 1986 and 1997    Hypothyroidism    Low back pain with sciatica    Measles    hx of   Mitral valve prolapse    Nocturia    Osteoporosis    Peripheral neuropathy    feet   PONV (postoperative nausea and vomiting)    PTSD (post-traumatic stress disorder) 2006   RSD (reflex sympathetic dystrophy) 1993   Spider veins    Urinary incontinence    occasional   Varicose veins     Past Surgical History:  Procedure Laterality Date   ESOPHAGEAL MANOMETRY N/A 12/17/2015   Procedure: ESOPHAGEAL MANOMETRY (EM);  Surgeon: Mauri Pole, MD;   [facility];  Service: Endoscopy;  Laterality: N/A;   HARDWARE REMOVAL Left 10/17/2013   Procedure: HARDWARE REMOVAL LEFT HIP;  Surgeon: Gearlean Alf, MD;  [facility];  Service: Orthopedics;  Laterality: Left;   HIP FRACTURE SURGERY Left 2006   3 screws   INSERTION OF MESH  02/08/2014   Procedure: INSERTION OF MESH;  Surgeon: Jackolyn Confer, MD;  [facility];  Service: General;;   melanoma surgery   1986, 1997   PARATHYROIDECTOMY  04/02/15   [place] IMPEDANCE STUDY N/A 12/17/2015   Procedure: [place] IMPEDANCE STUDY;  Surgeon: Mauri Pole, MD;  [facility];  Service: Endoscopy;  Laterality: N/A;   TOTAL HIP ARTHROPLASTY Left 04/10/2014   Procedure: LEFT TOTAL HIP ARTHROPLASTY ANTERIOR APPROACH;  Surgeon: Gearlean Alf, MD;  [facility];  Service: Orthopedics;  Laterality: Left;   UMBILICAL HERNIA REPAIR N/A 02/08/2014   Procedure: HERNIA REPAIR UMBILICAL ADULT WITH MESH;  Surgeon: Jackolyn Confer, MD;  [facility];  Service: General;  Laterality: N/A;  There were no vitals filed for this visit.   Subjective Assessment - 11/05/21 1105     Subjective tired. 2 new wrinkles after meeting with MD-adjusting thoriod meds-call into MD. need to f/u with hemotoligist    Currently in Pain? No/denies                               [facility] Adult PT Treatment/Exercise - 11/05/21 0001       Lumbar Exercises: Aerobic   Nustep L 4 76mn- HR after 73      Lumbar Exercises: Machines for Strengthening   Other Lumbar Machine Exercise seated row 20# 2x10, lats 20# 2x10   HR after 74     Knee/Hip Exercises: Machines for Strengthening   Cybex Knee Extension 5# 2 sets 10    Cybex Knee Flexion 15# 2 sets 10   HR 70 after (knee ext and HS)     Knee/Hip Exercises: Standing   Hip Flexion Stengthening;Both;10 reps;Knee bent;Knee straight   HHA 3#   Hip Abduction Stengthening;Both;10 reps;Knee straight   HHA 3#   Hip Extension  Stengthening;Both;10 reps;Knee bent;Knee straight   HHA 3#     Knee/Hip Exercises: Seated   Sit to Sand 2 sets;5 reps;without UE support   HR 75 after                      PT Short Term Goals - 03/21/21 1005       PT SHORT TERM GOAL #1   Title independent with HEP    Status Achieved               PT Long Term Goals - 10/31/21 1116       PT LONG TERM GOAL #1   Title go up and down stairs step over step consistently    Status Achieved      PT LONG TERM GOAL #2   Title return to independent gym program    Status Partially Met      PT LONG TERM GOAL #3   Title walk without device with minimal deviation    Status Achieved      PT LONG TERM GOAL #4   Title report pain decreased 50%    Status Achieved      PT LONG TERM GOAL #5   Title decrease TUG time to 13 seconds    Status Achieved                   Plan - 11/05/21 1113     Clinical Impression Statement goal of today was to get pt back to ex while monitoring HR and did very well. goals is to work to get back to [employer] program.- with conservation wts as it has been awhile since she did full gym ex. pt states zoom ex is very well but did have several questions I tried to  answer for her.    PT Treatment/Interventions ADLs/Self Care Home Management;Cryotherapy;Electrical Stimulation;Iontophoresis 442mml Dexamethasone;Moist Heat;Gait training;Stair training;Functional mobility training;Therapeutic activities;Therapeutic exercise;Balance training;Neuromuscular re-education;Manual techniques;Patient/family education    PT Next Visit Plan set her up on an indpendent routine             Patient will benefit from skilled therapeutic intervention in order to improve the following deficits and impairments:  Difficulty walking, Abnormal gait, Cardiopulmonary status limiting activity, Decreased endurance, Pain, Decreased activity tolerance, Decreased balance, Impaired flexibility, Decreased  strength, Decreased  mobility  Visit Diagnosis: Muscle weakness (generalized)     Problem List Patient Active Problem List   Diagnosis Date Noted   Upper respiratory infection 01/15/2018   Postherpetic neuralgia 12/25/2016   Dysesthesia 12/25/2016   Polyneuropathy 12/25/2016   Exposure to influenza 04/01/2016   Dysphagia    Bronchiectasis without acute exacerbation ([place]) 08/30/2015   Bronchiectasis ([place]) 08/14/2015   Chronic rhinitis 08/14/2015   Avascular necrosis of femur head, left ([place]) 04/10/2014   OA (osteoarthritis) of hip 04/10/2014   Hypothyroidism 12/09/2013   Painful orthopaedic hardware ([place]) 10/17/2013    Milli Woolridge,ANGIE, PTA 11/05/2021, 11:30 AM  [place]. [place], [place], [address] Phone: [phone]   Fax:  [phone]  Name: Tiny Chaudhary MRN: [id number] Date of Birth: [birth year]

## 2021-11-07 ENCOUNTER — Telehealth: Payer: Self-pay | Admitting: Hematology and Oncology

## 2021-11-07 ENCOUNTER — Ambulatory Visit: Payer: Medicare Other | Admitting: Physical Therapy

## 2021-11-07 NOTE — Telephone Encounter (Signed)
Scheduled appt per 9/14 referral. Pt is aware of appt date and time. Pt is aware to arrive 15 mins prior to appt time and to bring and updated insurance card. Pt is aware of appt location.   

## 2021-11-12 ENCOUNTER — Ambulatory Visit: Payer: Medicare Other | Admitting: Physical Therapy

## 2021-11-13 ENCOUNTER — Ambulatory Visit (HOSPITAL_COMMUNITY): Payer: Medicare Other | Admitting: Nurse Practitioner

## 2021-11-15 ENCOUNTER — Other Ambulatory Visit: Payer: Self-pay | Admitting: *Deleted

## 2021-11-15 DIAGNOSIS — I471 Supraventricular tachycardia: Secondary | ICD-10-CM

## 2021-11-19 ENCOUNTER — Other Ambulatory Visit: Payer: Self-pay | Admitting: Cardiovascular Disease

## 2021-11-19 MED ORDER — METOPROLOL SUCCINATE ER 25 MG PO TB24: 25.0000 mg | ORAL_TABLET | Freq: Every day | ORAL | 0 refills | Status: DC

## 2021-11-19 MED ORDER — APIXABAN 5 MG PO TABS: 5.0000 mg | ORAL_TABLET | Freq: Two times a day (BID) | ORAL | 5 refills | Status: DC

## 2021-11-19 NOTE — Telephone Encounter (Signed)
Prescription refill request for Eliquis received. Indication: A Flutter Last office visit: 10/23/21  Elwyn Reach NP Scr: 0.93 on 11/01/21 Age: 76  Weight: 74.4kg  Based on above findings Eliquis '5mg'$  twice daily is the appropriate dose.  Refill approved.

## 2021-11-19 NOTE — Telephone Encounter (Signed)
*  STAT* If patient is at the pharmacy, call can be transferred to refill team.   1. Which medications need to be refilled? (please list name of each medication and dose if known)  metoprolol succinate (TOPROL-XL) 25 MG 24 hr tablet apixaban (ELIQUIS) 5 MG TABS tablet  2. Which pharmacy/location (including street and city if local pharmacy) is medication to be sent to? [facility] [id number] - [place], [place]  3. Do they need a 30 day or 90 day supply?  Patient is requesting enough medication to last her until 10/16 appointment if at all possible. She does not want a full 30 day supply

## 2021-11-20 ENCOUNTER — Encounter: Payer: Self-pay | Admitting: Physical Therapy

## 2021-11-20 ENCOUNTER — Ambulatory Visit: Payer: Medicare Other | Admitting: Physical Therapy

## 2021-11-20 DIAGNOSIS — M25572 Pain in left ankle and joints of left foot: Secondary | ICD-10-CM

## 2021-11-20 DIAGNOSIS — R262 Difficulty in walking, not elsewhere classified: Secondary | ICD-10-CM

## 2021-11-20 DIAGNOSIS — R600 Localized edema: Secondary | ICD-10-CM

## 2021-11-20 DIAGNOSIS — M6281 Muscle weakness (generalized): Secondary | ICD-10-CM | POA: Diagnosis not present

## 2021-11-20 DIAGNOSIS — M25552 Pain in left hip: Secondary | ICD-10-CM

## 2021-11-20 DIAGNOSIS — G8929 Other chronic pain: Secondary | ICD-10-CM

## 2021-11-20 NOTE — Therapy (Signed)
[place]. [place], [place], [address] Phone: [phone]   Fax:  [phone]  Physical Therapy Treatment  Patient Details  Name: Brooke Jimenez MRN: [id number] Date of Birth: 06/24/1945 Referring Provider (PT): Rolena Infante   Encounter Date: 11/20/2021   PT End of Session - 11/20/21 0932     Visit Number 65    Date for PT Re-Evaluation 11/30/21    Authorization Type Medicare    PT Start Time 0930    PT Stop Time 1016    PT Time Calculation (min) 46 min    Activity Tolerance Patient tolerated treatment well    Behavior During Therapy [facility] for tasks assessed/performed             Past Medical History:  Diagnosis Date   Acne rosacea    Anxiety    Arthritis    arthritis in hip    Bronchiectasis ([place])    Cataract    beginning   Complication of anesthesia    "really sore throat"   Corneal dystrophy    DDD (degenerative disc disease), cervical    and lower back   Diverticulosis    Dysphagia    chronic- please see ultrasound done 4/16 15 in EPIC    Dysrhythmia    sometimes patient has extra beats per patient    Esophageal dysmotility    Family history of adverse reaction to anesthesia    mother slow to wake up    GERD (gastroesophageal reflux disease)    under control   Glaucoma    "suspect"   Gout    one finger x1 attack   H/O hiatal hernia    Hearing loss    bilateral due to nerve loss. wears hearing aides   Heart murmur    no problem    History of chicken pox    History of melanoma    melanoma- 1986 and 1997    Hypothyroidism    Low back pain with sciatica    Measles    hx of   Mitral valve prolapse    Nocturia    Osteoporosis    Peripheral neuropathy    feet   PONV (postoperative nausea and vomiting)    PTSD (post-traumatic stress disorder) 2006   RSD (reflex sympathetic dystrophy) 1993   Spider veins    Urinary incontinence    occasional   Varicose veins     Past Surgical  History:  Procedure Laterality Date   ESOPHAGEAL MANOMETRY N/A 12/17/2015   Procedure: ESOPHAGEAL MANOMETRY (EM);  Surgeon: Mauri Pole, MD;  Location: [facility];  Service: Endoscopy;  Laterality: N/A;   HARDWARE REMOVAL Left 10/17/2013   Procedure: HARDWARE REMOVAL LEFT HIP;  Surgeon: Gearlean Alf, MD;  Location: [facility];  Service: Orthopedics;  Laterality: Left;   HIP FRACTURE SURGERY Left 2006   3 screws   INSERTION OF MESH  02/08/2014   Procedure: INSERTION OF MESH;  Surgeon: Jackolyn Confer, MD;  Location: [facility];  Service: General;;   melanoma surgery   1986, 1997   PARATHYROIDECTOMY  04/02/15   [place] IMPEDANCE STUDY N/A 12/17/2015   Procedure: [place] IMPEDANCE STUDY;  Surgeon: Mauri Pole, MD;  [facility]: [facility];  Service: Endoscopy;  Laterality: N/A;   TOTAL HIP ARTHROPLASTY Left 04/10/2014   Procedure: LEFT TOTAL HIP ARTHROPLASTY ANTERIOR APPROACH;  Surgeon: Gearlean Alf, MD;  [facility]: [facility];  Service: Orthopedics;  Laterality: Left;   UMBILICAL HERNIA REPAIR N/A  02/08/2014   Procedure: HERNIA REPAIR UMBILICAL ADULT WITH MESH;  Surgeon: Jackolyn Confer, MD;  Location: [facility];  Service: General;  Laterality: N/A;    There were no vitals filed for this visit.   Subjective Assessment - 11/20/21 1034     Subjective Patient seeing and electrophysiologist and a hematologist to see if they can help with other issues.  She has also recently changed her thyroid medication.  She reports that she would like to be in the independent gym program    Currently in Pain? No/denies                               [facility] Adult PT Treatment/Exercise - 11/20/21 0001       Ambulation/Gait   Gait Comments gait outside around the back building, slow, some unsteadiness, but seemed to be when she was looking down, had to take 3 rest breaks      High Level Balance   High Level Balance Comments patient with a lot of questions regarding the balance HEP, went over  and demo with tem for her      Lumbar Exercises: Machines for Strengthening   Other Lumbar Machine Exercise started the education on the machines what they are called how to adjust the weights and perform, she may need help with the adjustment of the machines for her stature and the leg exercises                       PT Short Term Goals - 03/21/21 1005       PT SHORT TERM GOAL #1   Title independent with HEP    Status Achieved               PT Long Term Goals - 11/20/21 1036       PT LONG TERM GOAL #2   Title return to independent gym program    Status Partially Met                   Plan - 11/20/21 1038     Clinical Impression Statement Patinet has a lot of questions and I tried to answer them but also let her know that taking a day off is okay. Went over the balance and started the machine education, she weill need further education    PT Next Visit Plan I filled out a flow sheet for her and went over the machine names and weights, will need to do the actual machine adjustments for her body next and hopefully d/c with her knowing what to do    Consulted and Agree with Plan of Care Patient             Patient will benefit from skilled therapeutic intervention in order to improve the following deficits and impairments:  Difficulty walking, Abnormal gait, Cardiopulmonary status limiting activity, Decreased endurance, Pain, Decreased activity tolerance, Decreased balance, Impaired flexibility, Decreased strength, Decreased mobility  Visit Diagnosis: Muscle weakness (generalized)  Difficulty in walking, not elsewhere classified  Chronic bilateral low back pain without sciatica  Pain in left hip  Localized edema  Pain in left ankle and joints of left foot     Problem List Patient Active Problem List   Diagnosis Date Noted   Upper respiratory infection 01/15/2018   Postherpetic neuralgia 12/25/2016   Dysesthesia 12/25/2016    Polyneuropathy 12/25/2016   Exposure to influenza 04/01/2016  Dysphagia    Bronchiectasis without acute exacerbation ([place]) 08/30/2015   Bronchiectasis ([place]) 08/14/2015   Chronic rhinitis 08/14/2015   Avascular necrosis of femur head, left ([place]) 04/10/2014   OA (osteoarthritis) of hip 04/10/2014   Hypothyroidism 12/09/2013   Painful orthopaedic hardware ([place]) 10/17/2013    Sumner Boast, PT 11/20/2021, 10:41 AM  [place]. [place], [place], [address] Phone: [phone]   Fax:  [phone]  Name: Ranyia Witting MRN: [id number] Date of Birth: 28-Dec-1945

## 2021-11-22 ENCOUNTER — Ambulatory Visit: Payer: Medicare Other | Admitting: Physical Therapy

## 2021-11-22 ENCOUNTER — Encounter: Payer: Self-pay | Admitting: Physical Therapy

## 2021-11-22 DIAGNOSIS — M6281 Muscle weakness (generalized): Secondary | ICD-10-CM

## 2021-11-22 DIAGNOSIS — R600 Localized edema: Secondary | ICD-10-CM

## 2021-11-22 DIAGNOSIS — M545 Low back pain, unspecified: Secondary | ICD-10-CM

## 2021-11-22 DIAGNOSIS — M25552 Pain in left hip: Secondary | ICD-10-CM

## 2021-11-22 DIAGNOSIS — R262 Difficulty in walking, not elsewhere classified: Secondary | ICD-10-CM

## 2021-11-22 NOTE — Therapy (Signed)
[place]. [place], [place], [address] Phone: [phone]   Fax:  [phone]  Physical Therapy Treatment  Patient Details  Name: Brooke Jimenez MRN: [id number] Date of Birth: 17-Apr-1945 Referring Provider (PT): Rolena Infante   Encounter Date: 11/22/2021   PT End of Session - 11/22/21 1022     Visit Number 80    Date for PT Re-Evaluation 11/30/21    Authorization Type Medicare    PT Start Time 4627    PT Stop Time 1100    PT Time Calculation (min) 45 min    Activity Tolerance Patient tolerated treatment well    Behavior During Therapy [facility] for tasks assessed/performed             Past Medical History:  Diagnosis Date   Acne rosacea    Anxiety    Arthritis    arthritis in hip    Bronchiectasis ([place])    Cataract    beginning   Complication of anesthesia    "really sore throat"   Corneal dystrophy    DDD (degenerative disc disease), cervical    and lower back   Diverticulosis    Dysphagia    chronic- please see ultrasound done 4/16 15 in EP[facility]    Dysrhythmia    sometimes patient has extra beats per patient    Esophageal dysmotility    Family history of adverse reaction to anesthesia    mother slow to wake up    GERD (gastroesophageal reflux disease)    under control   Glaucoma    "suspect"   Gout    one finger x1 attack   H/O hiatal hernia    Hearing loss    bilateral due to nerve loss. wears hearing aides   Heart murmur    no problem    History of chicken pox    History of melanoma    melanoma- 1986 and 1997    Hypothyroidism    Low back pain with sciatica    Measles    hx of   Mitral valve prolapse    Nocturia    Osteoporosis    Peripheral neuropathy    feet   PONV (postoperative nausea and vomiting)    PTSD (post-traumatic stress disorder) 2006   RSD (reflex sympathetic dystrophy) 1993   Spider veins    Urinary incontinence    occasional   Varicose veins     Past Surgical  History:  Procedure Laterality Date   ESOPHAGEAL MANOMETRY N/A 12/17/2015   Procedure: ESOPHAGEAL MANOMETRY (EM);  Surgeon: Mauri Pole, MD;  [facility];  Service: Endoscopy;  Laterality: N/A;   HARDWARE REMOVAL Left 10/17/2013   Procedure: HARDWARE REMOVAL LEFT HIP;  Surgeon: Gearlean Alf, MD;  [facility];  Service: Orthopedics;  Laterality: Left;   HIP FRACTURE SURGERY Left 2006   3 screws   INSERTION OF MESH  02/08/2014   Procedure: INSERTION OF MESH;  Surgeon: Jackolyn Confer, MD;  [facility];  Service: General;;   melanoma surgery   1986, 1997   PARATHYROIDECTOMY  04/02/15   [place] IMPEDANCE STUDY N/A 12/17/2015   Procedure: [place] IMPEDANCE STUDY;  Surgeon: Mauri Pole, MD;  [facility];  Service: Endoscopy;  Laterality: N/A;   TOTAL HIP ARTHROPLASTY Left 04/10/2014   Procedure: LEFT TOTAL HIP ARTHROPLASTY ANTERIOR APPROACH;  Surgeon: Gearlean Alf, MD;  [facility];  Service: Orthopedics;  Laterality: Left;   UMBILICAL HERNIA REPAIR N/A  02/08/2014   Procedure: HERNIA REPAIR UMBILICAL ADULT WITH MESH;  Surgeon: Jackolyn Confer, MD;  [facility];  Service: General;  Laterality: N/A;    There were no vitals filed for this visit.   Subjective Assessment - 11/22/21 1023     Subjective I think I am ready to figure this out on my own    Currently in Pain? No/denies                               [facility] Adult PT Treatment/Exercise - 11/22/21 0001       Lumbar Exercises: Aerobic   UBE (Upper Arm Bike) L 3 3 min fwd/3 mn backward    Recumbent Bike level 3 x 4 minutes    Nustep level 5 x 6 minutes      Lumbar Exercises: Machines for Strengthening   Other Lumbar Machine Exercise seated row 20# 2x10, lats 20# 2x10    Other Lumbar Machine Exercise finished education about the machines adjustement, weights and sets/reps.  She may need some assist at times to do                       PT Short Term  Goals - 03/21/21 1005       PT SHORT TERM GOAL #1   Title independent with HEP    Status Achieved               PT Long Term Goals - 11/22/21 1152       PT LONG TERM GOAL #2   Title return to independent gym program    Status Achieved                   Plan - 11/22/21 1152     Clinical Impression Statement Finished the education about the gym and her safety and the machines, she was able to demonstrate well with some assist and she may require some assist in the future.    PT Next Visit Plan D/C with goals met    Consulted and Agree with Plan of Care Patient             Patient will benefit from skilled therapeutic intervention in order to improve the following deficits and impairments:     Visit Diagnosis: Muscle weakness (generalized)  Difficulty in walking, not elsewhere classified  Chronic bilateral low back pain without sciatica  Pain in left hip  Localized edema     Problem List Patient Active Problem List   Diagnosis Date Noted   Upper respiratory infection 01/15/2018   Postherpetic neuralgia 12/25/2016   Dysesthesia 12/25/2016   Polyneuropathy 12/25/2016   Exposure to influenza 04/01/2016   Dysphagia    Bronchiectasis without acute exacerbation ([place]) 08/30/2015   Bronchiectasis ([place]) 08/14/2015   Chronic rhinitis 08/14/2015   Avascular necrosis of femur head, left ([place]) 04/10/2014   OA (osteoarthritis) of hip 04/10/2014   Hypothyroidism 12/09/2013   Painful orthopaedic hardware ([place]) 10/17/2013    Sumner Boast, PT 11/22/2021, 11:53 AM  [place]. [place], [place], [address] Phone: [phone]   Fax:  [phone]  Name: Brooke Jimenez MRN: [id number] Date of Birth: [birth year]

## 2021-11-26 ENCOUNTER — Ambulatory Visit: Payer: Medicare Other | Admitting: Physical Therapy

## 2021-11-28 ENCOUNTER — Ambulatory Visit: Payer: Medicare Other | Admitting: Physical Therapy

## 2021-11-29 ENCOUNTER — Encounter: Payer: Self-pay | Admitting: Hematology and Oncology

## 2021-11-29 ENCOUNTER — Other Ambulatory Visit: Payer: Self-pay

## 2021-11-29 ENCOUNTER — Inpatient Hospital Stay: Payer: Medicare Other | Attending: Hematology and Oncology | Admitting: Hematology and Oncology

## 2021-11-29 VITALS — BP 122/65 | HR 68 | Temp 98.8°F | Resp 18 | Ht 64.5 in | Wt 161.2 lb

## 2021-11-29 DIAGNOSIS — Z7901 Long term (current) use of anticoagulants: Secondary | ICD-10-CM | POA: Insufficient documentation

## 2021-11-29 DIAGNOSIS — K219 Gastro-esophageal reflux disease without esophagitis: Secondary | ICD-10-CM | POA: Insufficient documentation

## 2021-11-29 DIAGNOSIS — Z79890 Hormone replacement therapy (postmenopausal): Secondary | ICD-10-CM | POA: Diagnosis not present

## 2021-11-29 DIAGNOSIS — I341 Nonrheumatic mitral (valve) prolapse: Secondary | ICD-10-CM | POA: Diagnosis not present

## 2021-11-29 DIAGNOSIS — G629 Polyneuropathy, unspecified: Secondary | ICD-10-CM | POA: Insufficient documentation

## 2021-11-29 DIAGNOSIS — Z85820 Personal history of malignant melanoma of skin: Secondary | ICD-10-CM | POA: Diagnosis not present

## 2021-11-29 DIAGNOSIS — J479 Bronchiectasis, uncomplicated: Secondary | ICD-10-CM | POA: Diagnosis not present

## 2021-11-29 DIAGNOSIS — L719 Rosacea, unspecified: Secondary | ICD-10-CM | POA: Diagnosis not present

## 2021-11-29 DIAGNOSIS — E039 Hypothyroidism, unspecified: Secondary | ICD-10-CM | POA: Insufficient documentation

## 2021-11-29 DIAGNOSIS — D72821 Monocytosis (symptomatic): Secondary | ICD-10-CM | POA: Insufficient documentation

## 2021-11-29 DIAGNOSIS — M810 Age-related osteoporosis without current pathological fracture: Secondary | ICD-10-CM | POA: Diagnosis not present

## 2021-11-29 NOTE — Assessment & Plan Note (Signed)
I have reviewed multiple test results Overall, the most likely cause of her chronic monocytosis is due to her chronic bronchiectasis She does not need treatment for this This is nonmalignant, especially in the setting of normal white blood cell count She does not need long-term follow-up or further investigation

## 2021-11-29 NOTE — Assessment & Plan Note (Signed)
She has chronic bronchiectasis She follows with pulmonologist I suspect this is the most likely cause of her chronic monocytosis

## 2021-11-29 NOTE — Progress Notes (Signed)
[facility] NOTE  Patient Care Team: Stann Mainland, MD as PCP - General (Internal Medicine)   ASSESSMENT & PLAN Reactive monocytosis I have reviewed multiple test results Overall, the most likely cause of her chronic monocytosis is due to her chronic bronchiectasis She does not need treatment for this This is nonmalignant, especially in the setting of normal white blood cell count She does not need long-term follow-up or further investigation   Bronchiectasis [facility]) She has chronic bronchiectasis She follows with pulmonologist I suspect this is the most likely cause of her chronic monocytosis  Rosacea Intermittent flare of rosacea can also trigger monocytosis She will take antibiotics if needed  All questions were answered. The patient knows to call the clinic with any problems, questions or concerns. I spent 55 minutes counseling the patient face to face and on counseling.     Heath Lark, MD 11/29/2021 10:57 AM  CHIEF COMPLAINTS/PURPOSE OF CONSULTATION:  Mild chronic monocytosis  HISTORY OF PRESENTING ILLNESS:  Brooke Jimenez 76 y.o. female is here because of chronic monocytosis.  She was found to have abnormal CBC from recent blood work I have reviewed her chart extensively On October 21, 2021, she presented to the emergency department for evaluation with viral illness and SVT.  Her white count was elevated at 13.2, monocyte count was 1.4 The patient was treated and subsequently discharged On 11/01/2021, her white count has improved to 10.3 with absolute monocyte count of 1.2 On November 21, 2021, her white count has improved back to normal at 7.4 with absolute monocyte count of 1.1  Looking back on her electronic records dated back to 2015, she had normal white blood cell count and monocyte count in the past Starting on 10/17/2016, her white count was 7.0 with monocyte count of 1.0 On June 13, 2021, her white count was 8.9 with a monocyte count  of 1.1 The patient has diagnosis of chronic bronchiectasis since 2017 She takes antibiotics several times this year Around March, she had cellulitis of her left lower leg that resolved with antibiotics Most recently, she took a course of doxycycline for sore throat as well as skin infection, her last course was approximately 1 month ago There is not reported symptoms of sinus congestion, cough, urinary frequency/urgency or dysuria, diarrhea, joint swelling/pain or abnormal skin rash.  She had no prior history or diagnosis of cancer. Her age appropriate screening programs are up-to-date. She does not smoke She has been evaluated by immunologist approximately 1 to 2 years ago and was told she has low IgM level for unknown reason She has occasional cough and difficulties with swallowing which she attributed to chronic reflux  MEDICAL HISTORY:  Past Medical History:  Diagnosis Date   Acne rosacea    Anxiety    Arthritis    arthritis in hip    Bronchiectasis ([place])    Cataract    beginning   Complication of anesthesia    "really sore throat"   Corneal dystrophy    DDD (degenerative disc disease), cervical    and lower back   Diverticulosis    Dysphagia    chronic- please see ultrasound done 4/16 15 in [facility]    Dysrhythmia    sometimes patient has extra beats per patient    Esophageal dysmotility    Family history of adverse reaction to anesthesia    mother slow to wake up    GERD (gastroesophageal reflux disease)    under control   Glaucoma    "  suspect"   Gout    one finger x1 attack   H/O hiatal hernia    Hearing loss    bilateral due to nerve loss. wears hearing aides   Heart murmur    no problem    History of chicken pox    History of melanoma    melanoma- 1986 and 1997    Hypothyroidism    Low back pain with sciatica    Measles    hx of   Mitral valve prolapse    Nocturia    Osteoporosis    Peripheral neuropathy    feet   PONV (postoperative nausea and vomiting)     PTSD (post-traumatic stress disorder) 2006   RSD (reflex sympathetic dystrophy) 1993   Spider veins    Urinary incontinence    occasional   Varicose veins     SURGICAL HISTORY: Past Surgical History:  Procedure Laterality Date   ESOPHAGEAL MANOMETRY N/A 12/17/2015   Procedure: ESOPHAGEAL MANOMETRY (EM);  Surgeon: Mauri Pole, MD;  [facility];  Service: Endoscopy;  Laterality: N/A;   HARDWARE REMOVAL Left 10/17/2013   Procedure: HARDWARE REMOVAL LEFT HIP;  Surgeon: Gearlean Alf, MD;  [facility];  Service: Orthopedics;  Laterality: Left;   HIP FRACTURE SURGERY Left 2006   3 screws   INSERTION OF MESH  02/08/2014   Procedure: INSERTION OF MESH;  Surgeon: Jackolyn Confer, MD;  [facility];  Service: General;;   melanoma surgery   1986, 1997   PARATHYROIDECTOMY  04/02/15   [place] IMPEDANCE STUDY N/A 12/17/2015   Procedure: [place] IMPEDANCE STUDY;  Surgeon: Mauri Pole, MD;  [facility];  Service: Endoscopy;  Laterality: N/A;   TOTAL HIP ARTHROPLASTY Left 04/10/2014   Procedure: LEFT TOTAL HIP ARTHROPLASTY ANTERIOR APPROACH;  Surgeon: Gearlean Alf, MD;  [facility];  Service: Orthopedics;  Laterality: Left;   UMBILICAL HERNIA REPAIR N/A 02/08/2014   Procedure: HERNIA REPAIR UMBILICAL ADULT WITH MESH;  Surgeon: Jackolyn Confer, MD;  [facility];  Service: General;  Laterality: N/A;    SOCIAL HISTORY: Social History   Socioeconomic History   Marital status: Married    Spouse name: Not on file   Number of children: Not on file   Years of education: Not on file   Highest education level: Not on file  Occupational History   Not on file  Tobacco Use   Smoking status: Never   Smokeless tobacco: Never  Substance and Sexual Activity   Alcohol use: No    Alcohol/week: 0.0 standard drinks of alcohol   Drug use: No   Sexual activity: Not on file  Other Topics Concern   Not on file  Social History Narrative   Not on file    Social Determinants of Health   Financial Resource Strain: Not on file  Food Insecurity: Not on file  Transportation Needs: Not on file  Physical Activity: Not on file  Stress: Not on file  Social Connections: Not on file  Intimate Partner Violence: Not on file    FAMILY HISTORY: Family History  Problem Relation Age of Onset   Cancer Mother    Hypothyroidism Mother    Congestive Heart Failure Mother    Hypertension Mother    Heart failure Father     ALLERGIES:  is allergic to codeine, levofloxacin, levaquin [levofloxacin in d5w], neosporin  [neomycin-bacitracin zn-polymyx], adhesive [tape], epinephrine, erythromycin, guaifenesin, minocycline, nickel, nsaids, and sulfa antibiotics.  MEDICATIONS:  Current Outpatient Medications  Medication Sig Dispense Refill   Cholecalciferol 50 MCG (2000 UT) CAPS Take 2 capsules by mouth daily.     apixaban (ELIQUIS) 5 MG TABS tablet Take 1 tablet (5 mg total) by mouth 2 (two) times daily. 60 tablet 5   calcium carbonate (TUMS - DOSED IN MG ELEMENTAL CALCIUM) 500 MG chewable tablet Chew 1 tablet by mouth daily as needed for indigestion or heartburn.     doxycycline (VIBRA-TABS) 100 MG tablet Take 1 tablet (100 mg total) by mouth 2 (two) times daily. (Patient taking differently: Take 100 mg by mouth daily as needed (as needed for Rosacea). For Rosacea prn) 20 tablet 0   levothyroxine (SYNTHROID, LEVOTHROID) 75 MCG tablet Take 75 mcg by mouth daily before breakfast. Reduced to 50 mcg daily     metoprolol succinate (TOPROL-XL) 25 MG 24 hr tablet Take 1 tablet (25 mg total) by mouth daily. 30 tablet 0   Multiple Vitamin (MULTIVITAMIN WITH MINERALS) TABS tablet Take 1 tablet by mouth daily.     sodium chloride (MURO 128) 5 % ophthalmic ointment Muro 128 5 % eye ointment     No current facility-administered medications for this visit.    REVIEW OF SYSTEMS:   Constitutional: Denies fevers, chills or abnormal night sweats Eyes: Denies blurriness  of vision, double vision or watery eyes Ears, nose, mouth, throat, and face: Denies mucositis or sore throat Respiratory: Denies cough, dyspnea or wheezes Cardiovascular: Denies palpitation, chest discomfort or lower extremity swelling Gastrointestinal:  Denies nausea, heartburn or change in bowel habits Skin: Denies abnormal skin rashes Lymphatics: Denies new lymphadenopathy or easy bruising Neurological:Denies numbness, tingling or new weaknesses Behavioral/Psych: Mood is stable, no new changes  All other systems were reviewed with the patient and are negative.  PHYSICAL EXAMINATION: ECOG PERFORMANCE STATUS: 1 - Symptomatic but completely ambulatory  Vitals:   11/29/21 1310  BP: 122/65  Pulse: 68  Resp: 18  Temp: 98.8 F (37.1 C)  SpO2: 98%   Filed Weights   11/29/21 1310  Weight: 161 lb 3.2 oz (73.1 kg)    GENERAL:alert, no distress and comfortable SKIN: skin color, texture, turgor are normal, no rashes or significant lesions EYES: normal, conjunctiva are pink and non-injected, sclera clear OROPHARYNX:no exudate, no erythema and lips, buccal mucosa, and tongue normal  NECK: supple, thyroid normal size, non-tender, without nodularity LYMPH:  no palpable lymphadenopathy in the cervical, axillary or inguinal LUNGS: Noted bibasilar crackles, more on the right than the left HEART: regular rate & rhythm and no murmurs and no lower extremity edema ABDOMEN:abdomen soft, non-tender and normal bowel sounds Musculoskeletal:no cyanosis of digits and no clubbing  PSYCH: alert & oriented x 3 with fluent speech NEURO: no focal motor/sensory deficits  LABORATORY DATA:  I have reviewed the data as listed Recent Results (from the past 2160 hour(s))  SARS Coronavirus 2 by RT PCR (hospital order, performed in [place] hospital lab) *cepheid single result test* Anterior Nasal Swab     Status: None   Collection Time: 10/21/21  7:52 PM   Specimen: Anterior Nasal Swab  Result Value Ref  Range   SARS Coronavirus 2 by RT PCR NEGATIVE NEGATIVE    Comment: (NOTE) SARS-CoV-2 target nucleic acids are NOT DETECTED.  The SARS-CoV-2 RNA is generally detectable in upper and lower respiratory specimens during the acute phase of infection. The lowest concentration of SARS-CoV-2 viral copies this assay can detect is 250 copies / mL. A negative result does not preclude SARS-CoV-2 infection and should not  be used as the sole basis for treatment or other patient management decisions.  A negative result may occur with improper specimen collection / handling, submission of specimen other than nasopharyngeal swab, presence of viral mutation(s) within the areas targeted by this assay, and inadequate number of viral copies (<250 copies / mL). A negative result must be combined with clinical observations, patient history, and epidemiological information.  Fact Sheet for Patients:   https://www.patel.info/  Fact Sheet for Healthcare Providers: https://hall.com/  This test is not yet approved or  cleared by the [place] FDA and has been authorized for detection and/or diagnosis of SARS-CoV-2 by FDA under an Emergency Use Authorization (EUA).  This EUA will remain in effect (meaning this test can be used) for the duration of the COVID-19 declaration under Section 564(b)(1) of the Act, 21 U.S.C. section 360bbb-3(b)(1), unless the authorization is terminated or revoked sooner.  Performed at [facility], [place] [address], [place], [place] [address]   Lactic acid, plasma     Status: None   Collection Time: 10/21/21  8:00 PM  Result Value Ref Range   Lactic Acid, Venous 0.8 0.5 - 1.9 mmol/L    Comment: Performed at [facility], [place] [address] [place], [place] [address]  Comprehensive metabolic panel     Status: Abnormal   Collection Time: 10/21/21  8:00 PM  Result Value Ref Range   Sodium 139 135 - 145  mmol/L   Potassium 4.0 3.5 - 5.1 mmol/L   Chloride 106 98 - 111 mmol/L   CO2 24 22 - 32 mmol/L   Glucose, Bld 117 (H) 70 - 99 mg/dL    Comment: Glucose reference range applies only to samples taken after fasting for at least 8 hours.   BUN 11 8 - 23 mg/dL   Creatinine, Ser 0.78 0.44 - 1.00 mg/dL   Calcium 8.8 (L) 8.9 - 10.3 mg/dL   Total Protein 7.4 6.5 - 8.1 g/dL   Albumin 4.2 3.5 - 5.0 g/dL   AST 20 15 - 41 U/L   ALT 18 0 - 44 U/L   Alkaline Phosphatase 42 38 - 126 U/L   Total Bilirubin 1.0 0.3 - 1.2 mg/dL   GFR, Estimated >60 >60 mL/min    Comment: (NOTE) Calculated using the CKD-EPI Creatinine Equation (2021)    Anion gap 9 5 - 15    Comment: Performed at [facility], [place] [address], [place], [place] [address]  CBC with Differential     Status: Abnormal   Collection Time: 10/21/21  8:00 PM  Result Value Ref Range   WBC 13.2 (H) 4.0 - 10.5 K/uL   RBC 4.62 3.87 - 5.11 MIL/uL   Hemoglobin 13.4 12.0 - 15.0 g/dL   HCT 42.4 36.0 - 46.0 %   MCV 91.8 80.0 - 100.0 fL   MCH 29.0 26.0 - 34.0 pg   MCHC 31.6 30.0 - 36.0 g/dL   RDW 14.5 11.5 - 15.5 %   Platelets 235 150 - 400 K/uL   nRBC 0.0 0.0 - 0.2 %   Neutrophils Relative % 78 %   Neutro Abs 10.4 (H) 1.7 - 7.7 K/uL   Lymphocytes Relative 10 %   Lymphs Abs 1.3 0.7 - 4.0 K/uL   Monocytes Relative 10 %   Monocytes Absolute 1.4 (H) 0.1 - 1.0 K/uL   Eosinophils Relative 0 %   Eosinophils Absolute 0.1 0.0 - 0.5 K/uL   Basophils Relative 1 %  Basophils Absolute 0.1 0.0 - 0.1 K/uL   Immature Granulocytes 1 %   Abs Immature Granulocytes 0.08 (H) 0.00 - 0.07 K/uL    Comment: Performed at [facility], [place] [address] [place], [place] [address]  Urinalysis, Routine w reflex microscopic Urine, Clean Catch     Status: Abnormal   Collection Time: 10/21/21  8:17 PM  Result Value Ref Range   Color, Urine YELLOW YELLOW   APPearance CLEAR CLEAR   Specific Gravity, Urine 1.019 1.005 - 1.030   pH 6.0  5.0 - 8.0   Glucose, UA NEGATIVE NEGATIVE mg/dL   Hgb urine dipstick NEGATIVE NEGATIVE   Bilirubin Urine NEGATIVE NEGATIVE   Ketones, ur 20 (A) NEGATIVE mg/dL   Protein, ur 30 (A) NEGATIVE mg/dL   Nitrite NEGATIVE NEGATIVE   Leukocytes,Ua TRACE (A) NEGATIVE   RBC / HPF 0-5 0 - 5 RBC/hpf   WBC, UA 0-5 0 - 5 WBC/hpf   Bacteria, UA RARE (A) NONE SEEN   Squamous Epithelial / LPF 0-5 0 - 5   Mucus PRESENT     Comment: Performed at [facility], [place] [address] [place], [place] [address]  Lactic acid, plasma     Status: None   Collection Time: 10/22/21  2:52 AM  Result Value Ref Range   Lactic Acid, Venous 1.0 0.5 - 1.9 mmol/L    Comment: Performed at [facility], [place] [address] [place], [place] [address]  Troponin I (High Sensitivity)     Status: None   Collection Time: 10/22/21  3:07 AM  Result Value Ref Range   Troponin I (High Sensitivity) 9 <18 ng/L    Comment: (NOTE) Elevated high sensitivity troponin I (hsTnI) values and significant  changes across serial measurements may suggest ACS but many other  chronic and acute conditions are known to elevate hsTnI results.  Refer to the "Links" section for chest pain algorithms and additional  guidance. Performed at [facility], [place] [address] [place], [place] [address]   TSH     Status: None   Collection Time: 10/23/21  4:49 PM  Result Value Ref Range   TSH 0.698 0.450 - 4.500 uIU/mL

## 2021-11-29 NOTE — Assessment & Plan Note (Signed)
Intermittent flare of rosacea can also trigger monocytosis She will take antibiotics if needed

## 2021-12-08 NOTE — Progress Notes (Unsigned)
Cardiology Office Note:    Date:  12/08/2021   ID:  Brooke Jimenez, DOB 04-May-1945, MRN [id number]  PCP:  Brooke Mainland, MD   [facility] Providers Cardiologist:  None Electrophysiologist:  Brooke Quitter, MD { Click to update primary MD,subspecialty MD or APP then REFRESH:1}    Referring MD: Brooke Maki Lanice Schwab, NP   Chief complaint: ***  History of Present Illness:    Deara Jimenez is a 76 y.o. female with a hx of bronchiectasis and mitral valve disease referred for management of atrial flutter.  She was seen in the emergency department in August of 2023.  She had recently seen her pulmonologist due to sore throat and was noted to have a heart rate of 150 bpm.  In the ER, her ECG showed a narrow complex tachycardia with a heart rate of 150 bpm, thought to be atrial flutter due to rate typical for atrial flutter with 2:1 block. She was started on metoprolol and eliquis. In follow-up with cardiology, she did not recall having any symptoms of elevated heart rate or arrhythmia - no palpitations, light headedness, fatigue.  She does note occasional isolated palpitations.     Arrhythmia History:    Past Medical History:  Diagnosis Date   Acne rosacea    Anxiety    Arthritis    arthritis in hip    Bronchiectasis ([facility])    Cataract    beginning   Complication of anesthesia    "really sore throat"   Corneal dystrophy    DDD (degenerative disc disease), cervical    and lower back   Diverticulosis    Dysphagia    chronic- please see ultrasound done 4/16 15 in [facility]    Dysrhythmia    sometimes patient has extra beats per patient    Esophageal dysmotility    Family history of adverse reaction to anesthesia    mother slow to wake up    GERD (gastroesophageal reflux disease)    under control   Glaucoma    "suspect"   Gout    one finger x1 attack   H/O hiatal hernia    Hearing loss    bilateral due to nerve loss. wears hearing aides   Heart murmur     no problem    History of chicken pox    History of melanoma    melanoma- 1986 and 1997    Hypothyroidism    Low back pain with sciatica    Measles    hx of   Mitral valve prolapse    Nocturia    Osteoporosis    Peripheral neuropathy    feet   PONV (postoperative nausea and vomiting)    PTSD (post-traumatic stress disorder) 2006   RSD (reflex sympathetic dystrophy) 1993   Spider veins    Urinary incontinence    occasional   Varicose veins     Past Surgical History:  Procedure Laterality Date   ESOPHAGEAL MANOMETRY N/A 12/17/2015   Procedure: ESOPHAGEAL MANOMETRY (EM);  Surgeon: Brooke Pole, MD;  [facility];  Service: Endoscopy;  Laterality: N/A;   HARDWARE REMOVAL Left 10/17/2013   Procedure: HARDWARE REMOVAL LEFT HIP;  Surgeon: Brooke Alf, MD;  [facility];  Service: Orthopedics;  Laterality: Left;   HIP FRACTURE SURGERY Left 2006   3 screws   INSERTION OF MESH  02/08/2014   Procedure: INSERTION OF MESH;  Surgeon: Brooke Confer, MD;  [facility];  Service: General;;   melanoma  surgery   1986, 1997   PARATHYROIDECTOMY  04/02/15   [place] IMPEDANCE STUDY N/A 12/17/2015   Procedure: [place] IMPEDANCE STUDY;  Surgeon: Brooke Pole, MD;  [facility];  Service: Endoscopy;  Laterality: N/A;   TOTAL HIP ARTHROPLASTY Left 04/10/2014   Procedure: LEFT TOTAL HIP ARTHROPLASTY ANTERIOR APPROACH;  Surgeon: Brooke Alf, MD;  [facility];  Service: Orthopedics;  Laterality: Left;   UMBILICAL HERNIA REPAIR N/A 02/08/2014   Procedure: HERNIA REPAIR UMBILICAL ADULT WITH MESH;  Surgeon: Brooke Confer, MD;  [facility];  Service: General;  Laterality: N/A;    Current Medications: No outpatient medications have been marked as taking for the 12/09/21 encounter (Appointment) with Brooke Jimenez, Brooke Barre, MD.     Allergies:   Codeine, Levofloxacin, Levaquin [levofloxacin in d5w], Neosporin  [neomycin-bacitracin zn-polymyx], Adhesive [tape],  Epinephrine, Erythromycin, Guaifenesin, Minocycline, Nickel, Nsaids, and Sulfa antibiotics   Social History   Socioeconomic History   Marital status: Married    Spouse name: Not on file   Number of children: Not on file   Years of education: Not on file   Highest education level: Not on file  Occupational History   Not on file  Tobacco Use   Smoking status: Never   Smokeless tobacco: Never  Substance and Sexual Activity   Alcohol use: No    Alcohol/week: 0.0 standard drinks of alcohol   Drug use: No   Sexual activity: Not on file  Other Topics Concern   Not on file  Social History Narrative   Not on file   Social Determinants of Health   Financial Resource Strain: Not on file  Food Insecurity: Not on file  Transportation Needs: Not on file  Physical Activity: Not on file  Stress: Not on file  Social Connections: Not on file     Family History: The patient's family history includes Cancer in her mother; Congestive Heart Failure in her mother; Heart failure in her father; Hypertension in her mother; Hypothyroidism in her mother.  ROS:   Please see the history of present illness.    All other systems reviewed and are negative.  EKGs/Labs/Other Studies Reviewed:    Ziopatch monitor 11/14/2021 Patch Wear Time:  13 days and 20 hours (2023-08-30T16:43:13-0400 to 2023-09-13T13:26:21-0400)   Patient had a min HR of 47 bpm, max HR of 184 bpm, and avg HR of 69 bpm. Predominant underlying rhythm was Sinus Rhythm. 25 Supraventricular Tachycardia runs occurred, the run with the fastest interval lasting 53 mins 59 secs with a max rate of 184 bpm  (avg 137 bpm); the run with the fastest interval was also the longest. True duration of Supraventricular Tachycardia difficult to ascertain due to artifact. Supraventricular Tachycardia was detected within +/- 45 seconds of symptomatic patient event(s).  Isolated SVEs were rare (<1.0%), SVE Couplets were rare (<1.0%), and SVE Triplets were  rare (<1.0%). Isolated VEs were rare (<1.0%), VE Couplets were rare (<1.0%), and no VE Triplets were present. Ventricular Bigeminy and Trigeminy were present.  Lexiscan Myoview 09/16/19   Nuclear stress EF: 65%. No T wave inversion was noted during stress. There was no ST segment deviation noted during stress. This is a low risk study.     Echo 09/16/19    1. Left ventricular ejection fraction, by estimation, is 60 to 65%. The  left ventricle has normal function. The left ventricle has no regional  wall motion abnormalities. Left ventricular diastolic parameters are  consistent with Grade I diastolic  dysfunction (impaired  relaxation).   2. Right ventricular systolic function is normal. The right ventricular  size is normal. Tricuspid regurgitation signal is inadequate for assessing  PA pressure.   3. The mitral valve is normal in structure. No evidence of mitral valve  regurgitation. No evidence of mitral stenosis.   4. The aortic valve is tricuspid. Aortic valve regurgitation is mild.  Mild aortic valve sclerosis is present, with no evidence of aortic valve  stenosis.   5. The inferior vena cava is normal in size with greater than 50%  respiratory variability, suggesting right atrial pressure of 3 mmHg.   EKG:  10/21/2021: thought to be atrial flutter shows an atrial or sinus tachycardia at 150 bpm with 1:1 conductionj.  Last EKG results: ***   Recent Labs: 10/21/2021: ALT 18; BUN 11; Creatinine, Ser 0.78; Hemoglobin 13.4; Platelets 235; Potassium 4.0; Sodium 139 10/23/2021: TSH 0.698    Risk Assessment/Calculations:   {Does this patient have ATRIAL FIBRILLATION?:[phone]}  No BP recorded.  {Refresh Note OR Click here to enter BP  :1}***         Physical Exam:    VS:  There were no vitals taken for this visit.    Wt Readings from Last 3 Encounters:  11/29/21 161 lb 3.2 oz (73.1 kg)  10/23/21 164 lb (74.4 kg)  10/21/21 168 lb 14.4 oz (76.6 kg)     GEN: *** Well  nourished, well developed in no acute distress CARDIAC: ***RRR, no murmurs, rubs, gallops RESPIRATORY:  Normal work of breathing MUSCULOSKELETAL: *** edema    ASSESSMENT & PLAN:    SVT: most consistent with atrial tachycardia Bronchiectasis      {Are you ordering a CV Procedure (e.g. stress test, cath, DCCV, TEE, etc)?   Press F2        :[id number]}    Medication Adjustments/Labs and Tests Ordered: Current medicines are reviewed at length with the patient today.  Concerns regarding medicines are outlined above.  No orders of the defined types were placed in this encounter.  No orders of the defined types were placed in this encounter.    Signed, Brooke Quitter, MD  12/08/2021 7:23 PM    [place]

## 2021-12-09 ENCOUNTER — Ambulatory Visit: Payer: Medicare Other | Attending: Cardiovascular Disease | Admitting: Cardiovascular Disease

## 2021-12-09 ENCOUNTER — Encounter: Payer: Self-pay | Admitting: Cardiovascular Disease

## 2021-12-09 VITALS — BP 118/76 | HR 87 | Ht 64.5 in | Wt 163.0 lb

## 2021-12-09 DIAGNOSIS — I4719 Other supraventricular tachycardia: Secondary | ICD-10-CM | POA: Diagnosis present

## 2021-12-09 DIAGNOSIS — R9431 Abnormal electrocardiogram [ECG] [EKG]: Secondary | ICD-10-CM | POA: Insufficient documentation

## 2021-12-09 MED ORDER — FLECAINIDE ACETATE 50 MG PO TABS: 50.0000 mg | ORAL_TABLET | Freq: Two times a day (BID) | ORAL | 3 refills | Status: DC

## 2021-12-09 NOTE — Patient Instructions (Addendum)
Medication Instructions:  Your physician has recommended you make the following change in your medication:  1) STOP taking Eliquis  *If you need a refill on your cardiac medications before your next appointment, please call your pharmacy*  Testing/Procedures: Your physician has requested that you have an echocardiogram. Echocardiography is a painless test that uses sound waves to create images of your heart. It provides your doctor with information about the size and shape of your heart and how well your heart's chambers and valves are working. This procedure takes approximately one hour. There are no restrictions for this procedure. Please do NOT wear cologne, perfume, aftershave, or lotions (deodorant is allowed). Please arrive 15 minutes prior to your appointment time.   Your physician has requested that you have an implanted loop recorder. There are no special instructions or restrictions prior to this appointment.   Follow-Up: At [facility], you and your health needs are our priority.  As part of our continuing mission to provide you with exceptional heart care, we have created designated Provider Care Teams.  These Care Teams include your primary Cardiologist (physician) and Advanced Practice Providers (APPs -  Physician Assistants and Nurse Practitioners) who all work together to provide you with the care you need, when you need it.  Your next appointment:     The format for your next appointment:   In Person  Provider:   Melida Quitter, MD   Important Information About Sugar

## 2021-12-09 NOTE — Addendum Note (Signed)
Addended by: Bernestine Amass on: 12/09/2021 11:00 AM   Modules accepted: Orders

## 2021-12-10 ENCOUNTER — Ambulatory Visit (HOSPITAL_COMMUNITY): Payer: Medicare Other | Admitting: Nurse Practitioner

## 2021-12-16 ENCOUNTER — Other Ambulatory Visit: Payer: Self-pay | Admitting: Cardiovascular Disease

## 2021-12-17 ENCOUNTER — Ambulatory Visit: Payer: Medicare Other | Attending: Cardiovascular Disease | Admitting: Cardiovascular Disease

## 2021-12-17 ENCOUNTER — Encounter: Payer: Self-pay | Admitting: Cardiovascular Disease

## 2021-12-17 VITALS — BP 128/76 | HR 67 | Ht 64.5 in | Wt 162.0 lb

## 2021-12-17 DIAGNOSIS — I471 Supraventricular tachycardia, unspecified: Secondary | ICD-10-CM | POA: Insufficient documentation

## 2021-12-17 NOTE — Progress Notes (Addendum)
Cardiology Office Note:    Date:  12/17/2021   ID:  Brooke Jimenez, DOB [birth year], MRN [id number]  PCP:  Karleen Hampshire., MD   [place] Providers Cardiologist:  None Electrophysiologist:  Melida Quitter, MD     Referring MD: Karleen Hampshire., MD   Chief complaint: elevated heart rates  History of Present Illness:    Brooke Jimenez is a 76 y.o. female with a hx of bronchiectasis and mitral valve disease referred for management of atrial flutter.  She was seen in the emergency department in August of 2023.  She had recently seen her pulmonologist due to sore throat and was noted to have a heart rate of 150 bpm.  In the ER, her ECG showed a narrow complex tachycardia with a heart rate of 150 bpm, thought to be atrial flutter due to rate typical for atrial flutter with 2:1 block. She was started on metoprolol and eliquis. In follow-up with cardiology, she did not recall having any symptoms of elevated heart rate or arrhythmia - no palpitations, light headedness, fatigue.  She does note occasional palpitations.  She has shortness of breath due to bronchiectasis.  She has not had chest pain, syncope, presyncope, lightheadedness.     Past Medical History:  Diagnosis Date   Acne rosacea    Anxiety    Arthritis    arthritis in hip    Bronchiectasis (HCC)    Cataract    beginning   Complication of anesthesia    "really sore throat"   Corneal dystrophy    DDD (degenerative disc disease), cervical    and lower back   Diverticulosis    Dysphagia    chronic- please see ultrasound done 4/16 15 in [facility]    Dysrhythmia    sometimes patient has extra beats per patient    Esophageal dysmotility    Family history of adverse reaction to anesthesia    mother slow to wake up    GERD (gastroesophageal reflux disease)    under control   Glaucoma    "suspect"   Gout    one finger x1 attack   H/O hiatal hernia    Hearing loss    bilateral due to nerve loss. wears hearing  aides   Heart murmur    no problem    History of chicken pox    History of melanoma    melanoma- 1986 and 1997    Hypothyroidism    Low back pain with sciatica    Measles    hx of   Mitral valve prolapse    Nocturia    Osteoporosis    Peripheral neuropathy    feet   PONV (postoperative nausea and vomiting)    PTSD (post-traumatic stress disorder) 2006   RSD (reflex sympathetic dystrophy) 1993   Spider veins    Urinary incontinence    occasional   Varicose veins     Past Surgical History:  Procedure Laterality Date   ESOPHAGEAL MANOMETRY N/A 12/17/2015   Procedure: ESOPHAGEAL MANOMETRY (EM);  Surgeon: Mauri Pole, MD;  Location: [facility];  Service: Endoscopy;  Laterality: N/A;   HARDWARE REMOVAL Left 10/17/2013   Procedure: HARDWARE REMOVAL LEFT HIP;  Surgeon: Gearlean Alf, MD;  Location: [facility];  Service: Orthopedics;  Laterality: Left;   HIP FRACTURE SURGERY Left 2006   3 screws   INSERTION OF MESH  02/08/2014   Procedure: INSERTION OF MESH;  Surgeon: Jackolyn Confer, MD;  Location: [facility];  Service: General;;   melanoma surgery   1986, 1997   PARATHYROIDECTOMY  04/02/15   [place] IMPEDANCE STUDY N/A 12/17/2015   Procedure: [place] IMPEDANCE STUDY;  Surgeon: Mauri Pole, MD;  Location: [facility];  Service: Endoscopy;  Laterality: N/A;   TOTAL HIP ARTHROPLASTY Left 04/10/2014   Procedure: LEFT TOTAL HIP ARTHROPLASTY ANTERIOR APPROACH;  Surgeon: Gearlean Alf, MD;  Location: [facility];  Service: Orthopedics;  Laterality: Left;   UMBILICAL HERNIA REPAIR N/A 02/08/2014   Procedure: HERNIA REPAIR UMBILICAL ADULT WITH MESH;  Surgeon: Jackolyn Confer, MD;  Location: [facility];  Service: General;  Laterality: N/A;    Current Medications: Current Meds  Medication Sig   calcium carbonate (TUMS - DOSED IN MG ELEMENTAL CALCIUM) 500 MG chewable tablet Chew 1 tablet by mouth daily as needed for indigestion or heartburn.   doxycycline (VIBRAMYCIN) 100 MG capsule Take 1  capsule (100 mg total) by mouth 2 (two) times daily.   ergocalciferol (VITAMIN D2) 1.25 MG (50000 UT) capsule Take by mouth. weekly   levothyroxine (SYNTHROID) 50 MCG tablet Take 50 mcg by mouth daily.   metoprolol succinate (TOPROL-XL) 25 MG 24 hr tablet Take 1 tablet (25 mg total) by mouth daily. Please keep scheduled appointment for future refills. Thank you.   Multiple Vitamin (MULTIVITAMIN WITH MINERALS) TABS tablet Take 1 tablet by mouth daily.   sodium chloride (MURO 128) 5 % ophthalmic ointment Muro 128 5 % eye ointment     Allergies:   Codeine, Levofloxacin, Levaquin [levofloxacin in d5w], Neosporin  [neomycin-bacitracin zn-polymyx], Adhesive [tape], Epinephrine, Erythromycin, Guaifenesin, Minocycline, Nickel, Nsaids, and Sulfa antibiotics   Social History   Socioeconomic History   Marital status: Married    Spouse name: Not on file   Number of children: Not on file   Years of education: Not on file   Highest education level: Not on file  Occupational History   Not on file  Tobacco Use   Smoking status: Never   Smokeless tobacco: Never  Substance and Sexual Activity   Alcohol use: No    Alcohol/week: 0.0 standard drinks of alcohol   Drug use: No   Sexual activity: Not on file  Other Topics Concern   Not on file  Social History Narrative   Not on file   Social Determinants of Health   Financial Resource Strain: Not on file  Food Insecurity: Not on file  Transportation Needs: Not on file  Physical Activity: Not on file  Stress: Not on file  Social Connections: Not on file     Family History: The patient's family history includes Cancer in her mother; Congestive Heart Failure in her mother; Heart failure in her father; Hypertension in her mother; Hypothyroidism in her mother.  ROS:   Please see the history of present illness.    All other systems reviewed and are negative.  EKGs/Labs/Other Studies Reviewed:    Ziopatch monitor 11/14/2021 Patch Wear Time:  13  days and 20 hours (2023-08-30T16:43:13-0400 to 2023-09-13T13:26:21-0400)   Patient had a min HR of 47 bpm, max HR of 184 bpm, and avg HR of 69 bpm. Predominant underlying rhythm was Sinus Rhythm. 25 Supraventricular Tachycardia runs occurred, the run with the fastest interval lasting 53 mins 59 secs with a max rate of 184 bpm  (avg 137 bpm); the run with the fastest interval was also the longest. True duration of Supraventricular Tachycardia difficult to ascertain due to artifact. Supraventricular Tachycardia was detected within +/- 45 seconds of symptomatic patient  event(s).  Isolated SVEs were rare (<1.0%), SVE Couplets were rare (<1.0%), and SVE Triplets were rare (<1.0%). Isolated VEs were rare (<1.0%), VE Couplets were rare (<1.0%), and no VE Triplets were present. Ventricular Bigeminy and Trigeminy were present.  Lexiscan Myoview 09/16/19   Nuclear stress EF: 65%. No T wave inversion was noted during stress. There was no ST segment deviation noted during stress. This is a low risk study.     Echo 09/16/19    1. Left ventricular ejection fraction, by estimation, is 60 to 65%. The  left ventricle has normal function. The left ventricle has no regional  wall motion abnormalities. Left ventricular diastolic parameters are  consistent with Grade I diastolic  dysfunction (impaired relaxation).   2. Right ventricular systolic function is normal. The right ventricular  size is normal. Tricuspid regurgitation signal is inadequate for assessing  PA pressure.   3. The mitral valve is normal in structure. No evidence of mitral valve  regurgitation. No evidence of mitral stenosis.   4. The aortic valve is tricuspid. Aortic valve regurgitation is mild.  Mild aortic valve sclerosis is present, with no evidence of aortic valve  stenosis.   5. The inferior vena cava is normal in size with greater than 50%  respiratory variability, suggesting right atrial pressure of 3 mmHg.   EKG:  10/21/2021:  thought to be atrial flutter shows an atrial or sinus tachycardia at 150 bpm with 1:1 conductionj.  Last EKG results: Sinus rhythm with PVC   Recent Labs: 10/21/2021: ALT 18; BUN 11; Creatinine, Ser 0.78; Hemoglobin 13.4; Platelets 235; Potassium 4.0; Sodium 139 10/23/2021: TSH 0.698          Physical Exam:    VS:  BP 128/76   Pulse 67   Ht 5' 4.5" (1.638 m)   Wt 162 lb (73.5 kg)   SpO2 97%   BMI 27.38 kg/m     Wt Readings from Last 3 Encounters:  12/17/21 162 lb (73.5 kg)  12/09/21 163 lb (73.9 kg)  11/29/21 161 lb 3.2 oz (73.1 kg)     GEN:  Well nourished, well developed in no acute distress CARDIAC: RRR, no murmurs, rubs, gallops RESPIRATORY:  Normal work of breathing MUSCULOSKELETAL: no edema    ASSESSMENT & PLAN:    SVT: most consistent with atrial tachycardia.  Cannot exclude atrial flutter on some of the EGM's from the Zio patch.  Some episodes of SVT have been sustained lasting about an hour.  I am concerned that she is at risk for developing atrial fibrillation or flutter given her burden of SVT.  I think a loop recorder is reasonable to monitor her largely asymptomatic arrhythmia for sustained tach or definitive flutter or fibrillation. I described the indication for the loop recorder and the risks. We will monitor through the loop and resume anticoagulation if we document fibrillation or flutter. Bronchiectasis       PREPROCEDURE DIAGNOSIS:  Atrial arrhythmia  POSTPROCEDURE DIAGNOSIS:  Atrial arrhythmia     PROCEDURES:   1. Implantable loop recorder implantation       DESCRIPTION OF PROCEDURE:  Informed written consent was obtained.  The patient required no sedation for the procedure today.  Mapping over the patient's chest was performed to identify the area where electrograms were most prominent for ILR recording.  This area was found to be the left parasternal region over the 4th intercostal space. The patients left chest was therefore prepped and draped  in the usual sterile fashion. The  skin overlying the left parasternal region was infiltrated with lidocaine for local analgesia.  A 0.5-cm incision was made over the left parasternal region over the 3rd intercostal space.  An Abbott implantable loop recorder was then placed into the pocket  R waves were very prominent and measured >0.51m.  Steri- Strips and a sterile dressing were then applied.  There were no early apparent complications.     CONCLUSIONS:   1. Successful implantation of an Abbott implantable loop recorder for a history of cryptogenic stroke  2. No early apparent complications.   Assert-IQ [id number] Signed, AMelida Quitter MD  12/17/2021 12:35 PM    [facility]

## 2021-12-17 NOTE — Patient Instructions (Signed)
Medication Instructions:  Your physician recommends that you continue on your current medications as directed. Please refer to the Current Medication list given to you today.  Labwork: None ordered.  Testing/Procedures: None ordered.  Follow-Up:  Your physician wants you to follow-up in: one year with Dr. Mealor.  You will receive a reminder letter in the mail two months in advance. If you don't receive a letter, please call our office to schedule the follow-up appointment.    Implantable Loop Recorder Placement, Care After This sheet gives you information about how to care for yourself after your procedure. Your health care provider may also give you more specific instructions. If you have problems or questions, contact your health care provider. What can I expect after the procedure? After the procedure, it is common to have: Soreness or discomfort near the incision. Some swelling or bruising near the incision.  Follow these instructions at home: Incision care  Monitor your cardiac device site for redness, swelling, and drainage. Call the device clinic at [phone] if you experience these symptoms or fever/chills.  Keep the large square bandage on your site for 24 hours and then you may remove it yourself. Keep the steri-strips underneath in place.   You may shower after 72 hours / 3 days from your procedure with the steri-strips in place. They will usually fall off on their own, or may be removed after 10 days. Pat dry.   Avoid lotions, ointments, or perfumes over your incision until it is well-healed.  Please do not submerge in water until your site is completely healed.   Your device is MRI compatible.   Remote monitoring is used to monitor your cardiac device from home. This monitoring is scheduled every month by our office. It allows us to keep an eye on the function of your device to ensure it is working properly.  If your wound site starts to bleed apply pressure.     For help with the monitor please call [employer] Monitor Support Specialist directly at [phone].    If you have any questions/concerns please call the device clinic at [phone].  Activity  Return to your normal activities.  General instructions Follow instructions from your health care provider about how to manage your implantable loop recorder and transmit the information. Learn how to activate a recording if this is necessary for your type of device. You may go through a metal detection gate, and you may let someone hold a metal detector over your chest. Show your ID card if needed. Do not have an MRI unless you check with your health care provider first. Take over-the-counter and prescription medicines only as told by your health care provider. Keep all follow-up visits as told by your health care provider. This is important. Contact a health care provider if: You have redness, swelling, or pain around your incision. You have a fever. You have pain that is not relieved by your pain medicine. You have triggered your device because of fainting (syncope) or because of a heartbeat that feels like it is racing, slow, fluttering, or skipping (palpitations). Get help right away if you have: Chest pain. Difficulty breathing. Summary After the procedure, it is common to have soreness or discomfort near the incision. Change your dressing as told by your health care provider. Follow instructions from your health care provider about how to manage your implantable loop recorder and transmit the information. Keep all follow-up visits as told by your health care provider. This is important. This information   not intended to replace advice given to you by your health care provider. Make sure you discuss any questions you have with your health care provider. Document Released: 01/22/2015 Document Revised: 03/28/2017 Document Reviewed: 03/28/2017 Elsevier Patient Education  2020 [employer].

## 2021-12-19 ENCOUNTER — Other Ambulatory Visit (HOSPITAL_COMMUNITY): Payer: Medicare Other

## 2021-12-27 ENCOUNTER — Telehealth: Payer: Self-pay | Admitting: Cardiovascular Disease

## 2021-12-27 NOTE — Telephone Encounter (Signed)
Pt c/o medication issue:  1. Name of Medication:  Flecainide   2. How are you currently taking this medication (dosage and times per day)?   3. Are you having a reaction (difficulty breathing--STAT)?   4. What is your medication issue?   Patient states when she went to her local pharmacy to pick up her prescriptions she was advised she had a prescription for Flecainide, prescribed by Dr. Myles Gip. Patient states she has never heard of this medication and would like to know if she needs to be taking it. Please advise.

## 2021-12-27 NOTE — Telephone Encounter (Signed)
Returned call to patient.  After reviewing office visit notes and medication history for flecainide it appears this medication may have been ordered in error and pharmacy was not notified of cancellation.  Will forward to Dr. Myles Gip to clarify this.  Patient would also like to know if the echo that is ordered is one that she will be able to get through as she cannot lay on her left side, it is painful for her. Mentioned that a note about this has been made on echo appt, unsure what accommodations will be made for this patient would like to know how this will be accommodated for her.  Also, patient asked if she would be able to have a mammogram performed with implanted loop recorder.  Will also forward to Dr. Karel Jarvis nurse to address these concerns.

## 2021-12-30 NOTE — Telephone Encounter (Signed)
Spoke with the patient and advised on recommendations from Dr. Myles Gip. Patient verbalized understanding

## 2022-01-02 ENCOUNTER — Ambulatory Visit (HOSPITAL_COMMUNITY): Payer: Medicare Other | Attending: Cardiovascular Disease

## 2022-01-02 DIAGNOSIS — R9431 Abnormal electrocardiogram [ECG] [EKG]: Secondary | ICD-10-CM | POA: Insufficient documentation

## 2022-01-02 DIAGNOSIS — I4719 Other supraventricular tachycardia: Secondary | ICD-10-CM | POA: Diagnosis present

## 2022-01-02 LAB — ECHOCARDIOGRAM COMPLETE
Area-P 1/2: 4.15 cm2
P 1/2 time: 448 msec
S' Lateral: 3.1 cm

## 2022-01-10 ENCOUNTER — Encounter: Payer: Self-pay | Admitting: Cardiovascular Disease

## 2022-01-17 ENCOUNTER — Other Ambulatory Visit: Payer: Self-pay | Admitting: Cardiovascular Disease

## 2022-01-20 ENCOUNTER — Ambulatory Visit (INDEPENDENT_AMBULATORY_CARE_PROVIDER_SITE_OTHER): Payer: Medicare Other

## 2022-01-20 DIAGNOSIS — I4892 Unspecified atrial flutter: Secondary | ICD-10-CM | POA: Diagnosis not present

## 2022-01-21 LAB — CUP PACEART REMOTE DEVICE CHECK
Date Time Interrogation Session: 20231127020227
Implantable Pulse Generator Implant Date: 20231024
Pulse Gen Serial Number: 511011009

## 2022-01-28 ENCOUNTER — Telehealth: Payer: Self-pay | Admitting: Cardiovascular Disease

## 2022-01-28 NOTE — Telephone Encounter (Signed)
Returned call to Pt.  She states she was able to see Dr. Karel Jarvis dictation but then it was not available in mychart.  Copied and pasted his dictation into mychart for her review.  Pt thanked nurse for assistance.

## 2022-01-28 NOTE — Telephone Encounter (Signed)
Patient is calling requesting a callback to discuss Dr. Karel Jarvis interpretation of her home remote device check. She states she is unable to see it through her portal and would like it sent to her as well. Please advise.

## 2022-02-14 ENCOUNTER — Telehealth: Payer: Self-pay

## 2022-02-14 NOTE — Telephone Encounter (Signed)
Attempted to contact Pt.  Went to VM.  Sent mychart message advising Pt symptom activation episodes were ST 120 BPM.  No atrial fibrillation.  Await further needs.

## 2022-02-14 NOTE — Telephone Encounter (Signed)
Alert received from [employer] [facility]:  7 separate symptom alerts sent for "fast HR, fluttering" 12/22 between 03:02-03:18 EGM's show ST ~ 120 bpm No phone call to clinic noted in EPIC, route to triage

## 2022-02-14 NOTE — Telephone Encounter (Signed)
Call back received from Pt.  She had some questions regarding her loop recorder and monitoring.  We discussed what her loop recorder was monitoring for.  Discussed using her symptom activator.  All questions answered.  Pt was thankful to have a better understanding of her loop.  She has number to device clinic if any further questions.

## 2022-02-20 ENCOUNTER — Ambulatory Visit (INDEPENDENT_AMBULATORY_CARE_PROVIDER_SITE_OTHER): Payer: Medicare Other

## 2022-02-20 DIAGNOSIS — I4892 Unspecified atrial flutter: Secondary | ICD-10-CM | POA: Diagnosis not present

## 2022-02-20 LAB — CUP PACEART REMOTE DEVICE CHECK
Date Time Interrogation Session: 20231227134638
Implantable Pulse Generator Implant Date: 20231024
Pulse Gen Serial Number: 511011009

## 2022-03-04 NOTE — Progress Notes (Signed)
[facility]lin Loop Recorder  

## 2022-03-11 NOTE — Progress Notes (Signed)
[facility] Summary Report / Loop Recorder

## 2022-03-15 IMAGING — CT CT ABD-PELV W/O CM
1 of 2 series · 14 of 32 positions shown, 19 images · non-contrast
Comparison: August 2020

CLINICAL DATA: Low back pain, technologist note states bilateral
abdominal pain

EXAM:
CT ABDOMEN AND PELVIS WITHOUT CONTRAST
TECHNIQUE: Multidetector CT imaging of the abdomen and pelvis was performed
following the standard protocol without IV contrast.

[Series 2: abd/pelvis w/(date) · axial · 0.90mm/px · z∈[+682,+1087]mm · 14 of 91 slices shown, 19 images]
[im 5/91  soft-tissue]
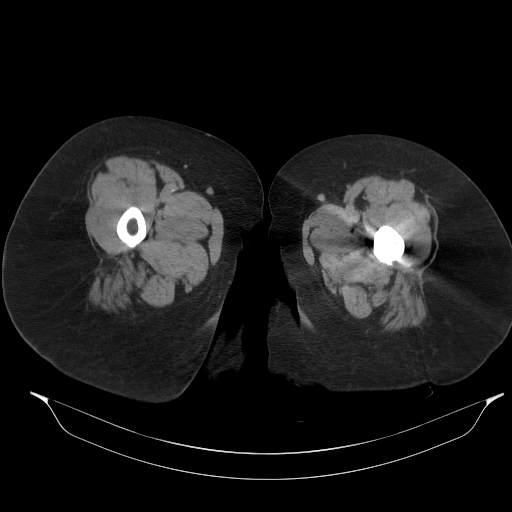
[im 5/91  bone]
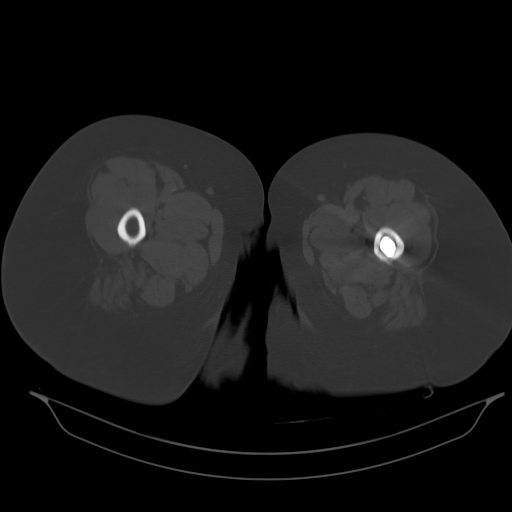
[im 15/91  soft-tissue]
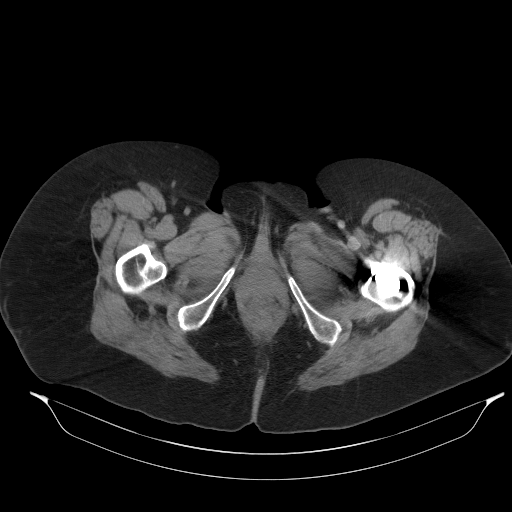
[im 19/91  soft-tissue]
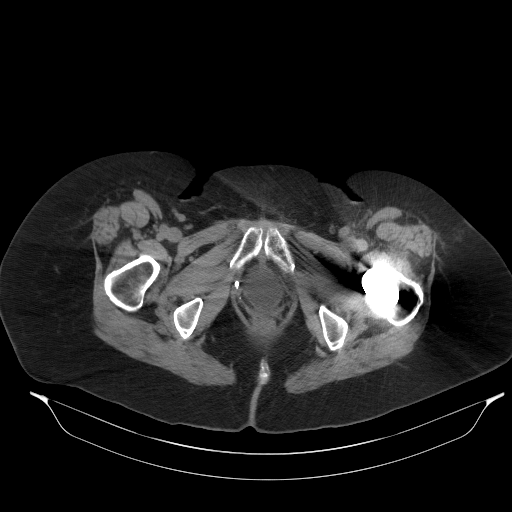
[im 24/91  soft-tissue]
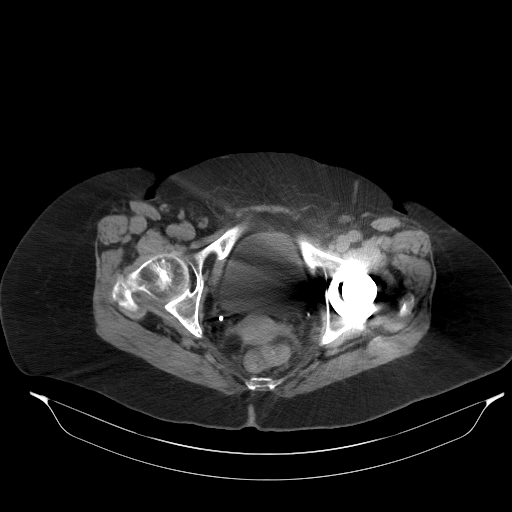
[im 34/91  soft-tissue]
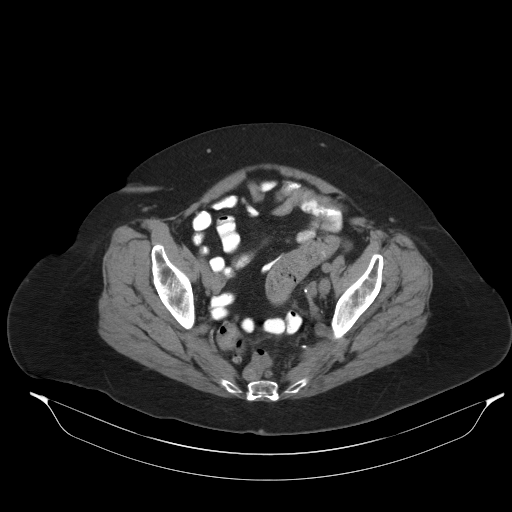
[im 38/91  soft-tissue]
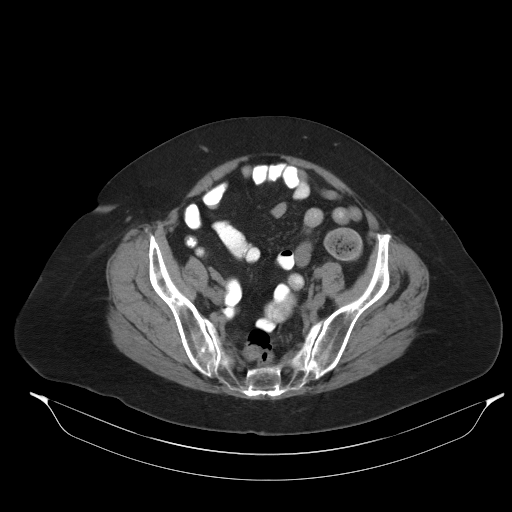
[im 48/91  soft-tissue]
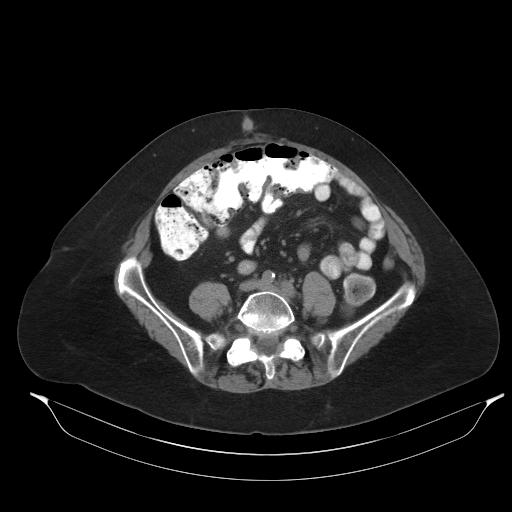
[im 53/91  soft-tissue]
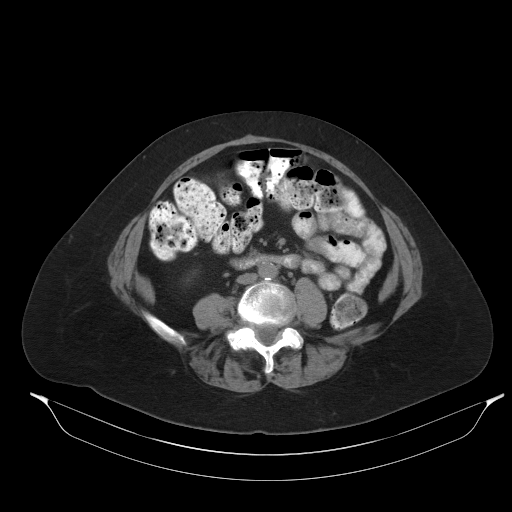
[im 57/91  soft-tissue]
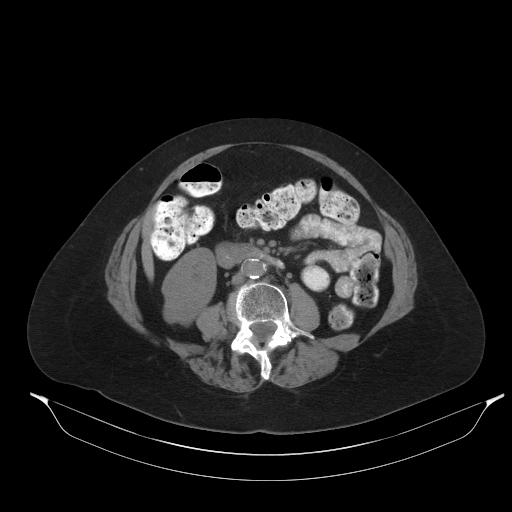
[im 57/91  bone]
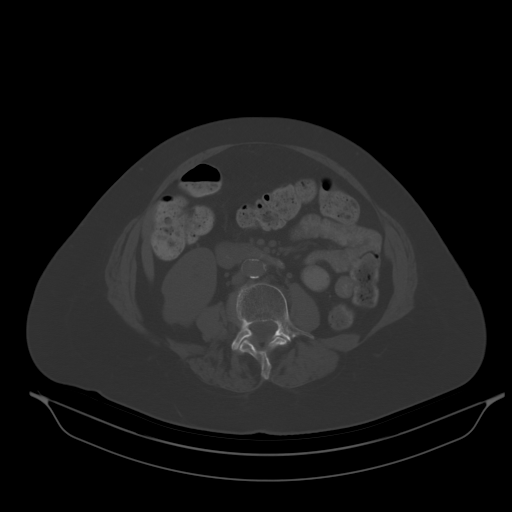
[im 67/91  soft-tissue]
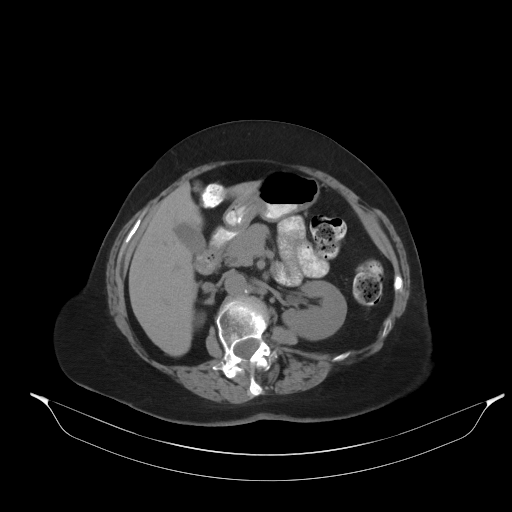
[im 72/91  soft-tissue]
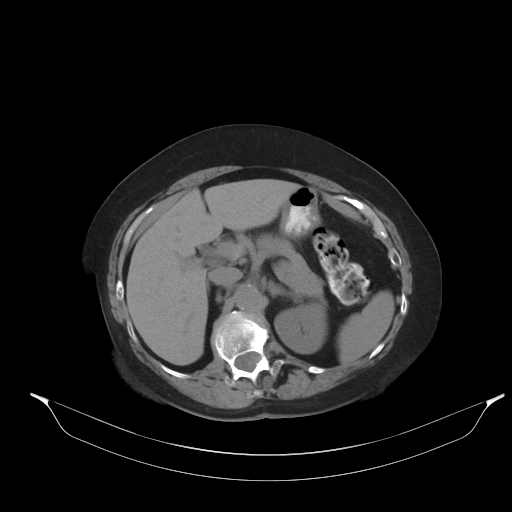
[im 72/91  lung]
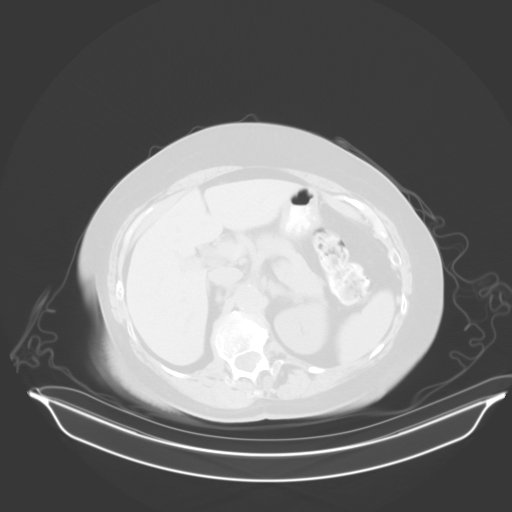
[im 76/91  soft-tissue]
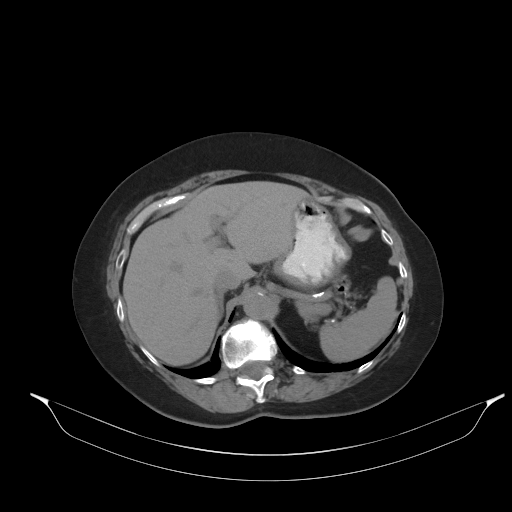
[im 76/91  lung]
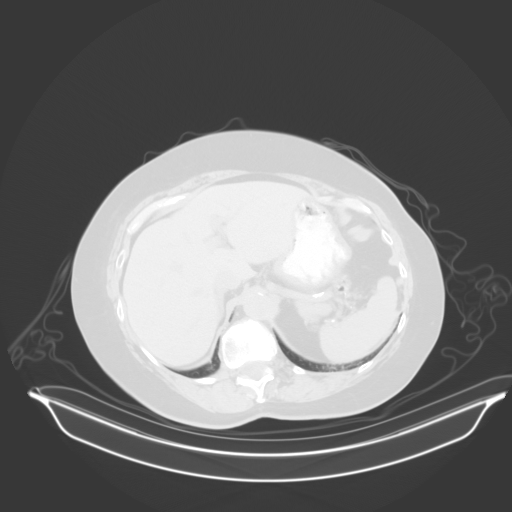
[im 81/91  lung]
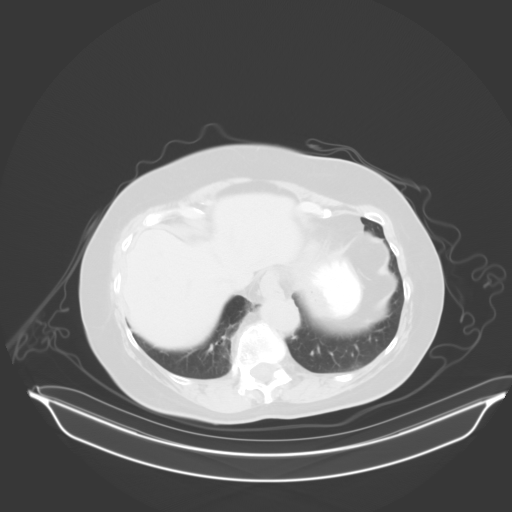
[im 86/91  soft-tissue]
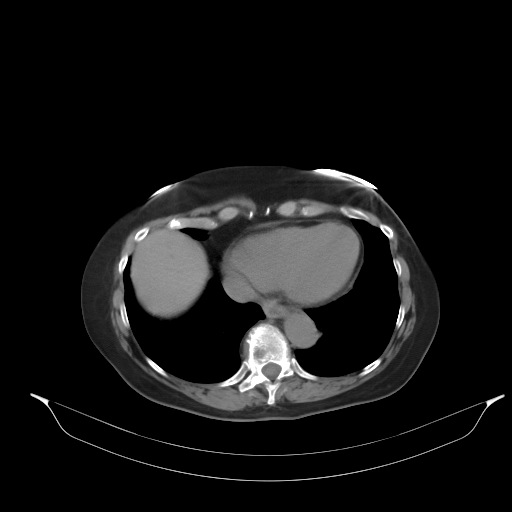
[im 86/91  lung]
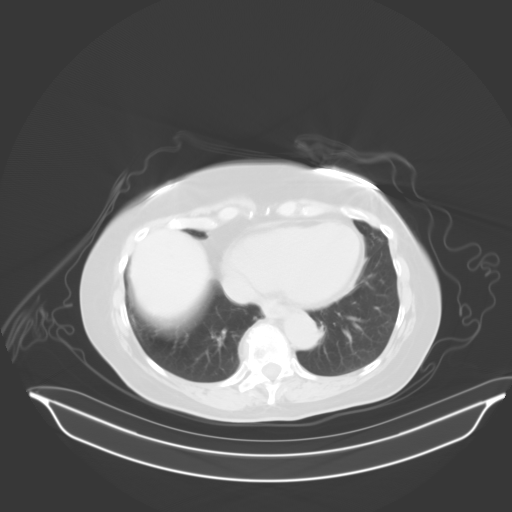

[14 of 32 positions shown; findings below may reference images not displayed]

FINDINGS: Lower chest: No acute abnormality.

Hepatobiliary: No focal liver abnormality is seen. No gallstones,
gallbladder wall thickening, or biliary dilatation.

Pancreas: Unremarkable.

Spleen: Unremarkable.

Adrenals/Urinary Tract: Adrenals are unremarkable. 4 mm right renal
calculus is again identified. Bladder is unremarkable but partially
obscured by artifact.

Stomach/Bowel: Stomach is within normal limits. Bowel is normal in
caliber. Sigmoid diverticulosis. Stool is present throughout the
colon.

Vascular/Lymphatic: Atherosclerosis.  No enlarged nodes.

Reproductive: Uterus and bilateral adnexa are unremarkable.

Other: No free fluid.  Abdominal wall is unremarkable.

Musculoskeletal: Sigmoid curvature lumbar spine. Chronic L2
compression fracture. Multilevel facet arthropathy, greatest the
right at lower lumbar levels. Right greater than left foraminal
narrowing. No high-grade canal narrowing identified this
nondedicated study. Left hip arthroplasty with associated streak
artifact.
IMPRESSION: No acute or new abnormality.  Stable findings are detailed above.

## 2022-03-24 ENCOUNTER — Ambulatory Visit: Payer: Medicare Other

## 2022-03-24 DIAGNOSIS — I4892 Unspecified atrial flutter: Secondary | ICD-10-CM

## 2022-03-26 LAB — CUP PACEART REMOTE DEVICE CHECK
Date Time Interrogation Session: 20240128142112
Implantable Pulse Generator Implant Date: 20231024
Pulse Gen Serial Number: 511011009

## 2022-04-03 ENCOUNTER — Encounter (HOSPITAL_COMMUNITY): Payer: Self-pay | Admitting: *Deleted

## 2022-04-24 ENCOUNTER — Ambulatory Visit: Payer: Medicare Other

## 2022-04-24 DIAGNOSIS — I4892 Unspecified atrial flutter: Secondary | ICD-10-CM

## 2022-04-24 LAB — CUP PACEART REMOTE DEVICE CHECK
Date Time Interrogation Session: 20240227205223
Implantable Pulse Generator Implant Date: 20231024
Pulse Gen Serial Number: 511011009

## 2022-05-08 NOTE — Progress Notes (Signed)
[facility]lin Loop Recorder  

## 2022-05-23 NOTE — Progress Notes (Signed)
[facility] Summary Report / Loop Recorder 

## 2022-05-26 ENCOUNTER — Ambulatory Visit (INDEPENDENT_AMBULATORY_CARE_PROVIDER_SITE_OTHER): Payer: Medicare Other

## 2022-05-26 DIAGNOSIS — I4892 Unspecified atrial flutter: Secondary | ICD-10-CM

## 2022-05-27 LAB — CUP PACEART REMOTE DEVICE CHECK
Date Time Interrogation Session: 20240330020921
Implantable Pulse Generator Implant Date: 20231024
Pulse Gen Serial Number: 511011009

## 2022-06-26 ENCOUNTER — Ambulatory Visit (INDEPENDENT_AMBULATORY_CARE_PROVIDER_SITE_OTHER): Payer: Medicare Other

## 2022-06-26 DIAGNOSIS — I4892 Unspecified atrial flutter: Secondary | ICD-10-CM | POA: Diagnosis not present

## 2022-06-26 LAB — CUP PACEART REMOTE DEVICE CHECK
Date Time Interrogation Session: 20240502093407
Implantable Pulse Generator Implant Date: 20231024
Pulse Gen Serial Number: 511011009

## 2022-07-03 NOTE — Progress Notes (Signed)
[facility]lin Loop Recorder 

## 2022-07-16 NOTE — Progress Notes (Signed)
[facility] Summary Report / Loop Recorder 

## 2022-07-17 ENCOUNTER — Other Ambulatory Visit: Payer: Self-pay | Admitting: Cardiovascular Disease

## 2022-07-28 ENCOUNTER — Ambulatory Visit (INDEPENDENT_AMBULATORY_CARE_PROVIDER_SITE_OTHER): Payer: Medicare Other

## 2022-07-28 DIAGNOSIS — I4892 Unspecified atrial flutter: Secondary | ICD-10-CM

## 2022-07-29 LAB — CUP PACEART REMOTE DEVICE CHECK
Date Time Interrogation Session: 20240602023212
Implantable Pulse Generator Implant Date: 20231024
Pulse Gen Serial Number: 511011009

## 2022-08-19 NOTE — Progress Notes (Signed)
[facility]lin Loop Recorder 

## 2022-08-27 LAB — CUP PACEART REMOTE DEVICE CHECK
Date Time Interrogation Session: 20240703021631
Implantable Pulse Generator Implant Date: 20231024
Pulse Gen Serial Number: 511011009

## 2022-08-29 ENCOUNTER — Ambulatory Visit (INDEPENDENT_AMBULATORY_CARE_PROVIDER_SITE_OTHER): Payer: Medicare Other

## 2022-08-29 DIAGNOSIS — I4892 Unspecified atrial flutter: Secondary | ICD-10-CM

## 2022-09-15 NOTE — Progress Notes (Signed)
[facility] Summary Report / Loop Recorder 

## 2022-09-29 ENCOUNTER — Ambulatory Visit: Payer: Medicare Other

## 2022-09-29 DIAGNOSIS — I4892 Unspecified atrial flutter: Secondary | ICD-10-CM | POA: Diagnosis not present

## 2022-09-29 LAB — CUP PACEART REMOTE DEVICE CHECK
Date Time Interrogation Session: 20240803043751
Implantable Pulse Generator Implant Date: 20231024
Pulse Gen Serial Number: 511011009

## 2022-10-14 NOTE — Progress Notes (Signed)
[facility]lin Loop Recorder  

## 2022-10-30 ENCOUNTER — Ambulatory Visit (INDEPENDENT_AMBULATORY_CARE_PROVIDER_SITE_OTHER): Payer: Medicare Other

## 2022-10-30 DIAGNOSIS — I4892 Unspecified atrial flutter: Secondary | ICD-10-CM | POA: Diagnosis not present

## 2022-10-30 LAB — CUP PACEART REMOTE DEVICE CHECK
Date Time Interrogation Session: 20240903020055
Implantable Pulse Generator Implant Date: 20231024
Pulse Gen Serial Number: 511011009

## 2022-11-04 NOTE — Progress Notes (Signed)
[facility] Summary Report / Loop Recorder 

## 2022-11-30 LAB — CUP PACEART REMOTE DEVICE CHECK
Date Time Interrogation Session: 20241004020055
Implantable Pulse Generator Implant Date: 20231024
Pulse Gen Serial Number: 511011009

## 2022-12-02 ENCOUNTER — Ambulatory Visit: Payer: Medicare Other

## 2022-12-02 DIAGNOSIS — I4892 Unspecified atrial flutter: Secondary | ICD-10-CM | POA: Diagnosis not present

## 2022-12-11 ENCOUNTER — Other Ambulatory Visit: Payer: Self-pay | Admitting: Orthopedic Surgery

## 2022-12-11 DIAGNOSIS — M25561 Pain in right knee: Secondary | ICD-10-CM

## 2022-12-11 DIAGNOSIS — Z96642 Presence of left artificial hip joint: Secondary | ICD-10-CM

## 2022-12-18 ENCOUNTER — Encounter: Payer: Self-pay | Admitting: Cardiovascular Disease

## 2022-12-18 ENCOUNTER — Ambulatory Visit: Payer: Medicare Other | Attending: Cardiovascular Disease | Admitting: Cardiovascular Disease

## 2022-12-18 VITALS — BP 110/68 | HR 65 | Ht 64.5 in | Wt 169.4 lb

## 2022-12-18 DIAGNOSIS — I4892 Unspecified atrial flutter: Secondary | ICD-10-CM | POA: Insufficient documentation

## 2022-12-18 DIAGNOSIS — I4719 Other supraventricular tachycardia: Secondary | ICD-10-CM | POA: Insufficient documentation

## 2022-12-18 DIAGNOSIS — I471 Supraventricular tachycardia, unspecified: Secondary | ICD-10-CM | POA: Diagnosis present

## 2022-12-18 NOTE — Progress Notes (Signed)
[facility]lin Loop Recorder  

## 2022-12-18 NOTE — Patient Instructions (Signed)

## 2022-12-18 NOTE — Progress Notes (Signed)
Cardiology Office Note:    Date:  12/18/2022   ID:  Brooke Jimenez, DOB 19-Feb-1946, MRN [id number]  PCP:  Laqueta Due., MD   [facility] Providers Cardiologist:  None Electrophysiologist:  Maurice Small, MD     Referring MD: Laqueta Due., MD   Chief complaint: elevated heart rates  History of Present Illness:    Brooke Jimenez is a 77 y.o. female with a hx of bronchiectasis and mitral valve disease referred for management of atrial flutter.  She was seen in the emergency department in August of 2023.  She had recently seen her pulmonologist due to sore throat and was noted to have a heart rate of 150 bpm.  In the ER, her ECG showed a narrow complex tachycardia with a heart rate of 150 bpm, thought to be atrial flutter due to rate typical for atrial flutter with 2:1 block. She was started on metoprolol and eliquis. In follow-up with cardiology, she did not recall having any symptoms of elevated heart rate or arrhythmia - no palpitations, light headedness, fatigue.  She does note occasional palpitations.  She has shortness of breath due to bronchiectasis.  She has not had chest pain, syncope, presyncope, lightheadedness. She has a fitbit watch and monitors her heart rate. Sometimes, her heart rate is registered at about 130 bpm. Sometimes correlates with shortness of breath.       EKGs/Labs/Other Studies Reviewed:    Ziopatch monitor 11/14/2021 Patch Wear Time:  13 days and 20 hours (2023-08-30T16:43:13-0400 to 2023-09-13T13:26:21-0400)   Patient had a min HR of 47 bpm, max HR of 184 bpm, and avg HR of 69 bpm. Predominant underlying rhythm was Sinus Rhythm. 25 Supraventricular Tachycardia runs occurred, the run with the fastest interval lasting 53 mins 59 secs with a max rate of 184 bpm  (avg 137 bpm); the run with the fastest interval was also the longest. True duration of Supraventricular Tachycardia difficult to ascertain due to artifact. Supraventricular  Tachycardia was detected within +/- 45 seconds of symptomatic patient event(s).  Isolated SVEs were rare (<1.0%), SVE Couplets were rare (<1.0%), and SVE Triplets were rare (<1.0%). Isolated VEs were rare (<1.0%), VE Couplets were rare (<1.0%), and no VE Triplets were present. Ventricular Bigeminy and Trigeminy were present.  Lexiscan Myoview 09/16/19   Nuclear stress EF: 65%. No T wave inversion was noted during stress. There was no ST segment deviation noted during stress. This is a low risk study.     Echo 09/16/19    1. Left ventricular ejection fraction, by estimation, is 60 to 65%. The  left ventricle has normal function. The left ventricle has no regional  wall motion abnormalities. Left ventricular diastolic parameters are  consistent with Grade I diastolic  dysfunction (impaired relaxation).   2. Right ventricular systolic function is normal. The right ventricular  size is normal. Tricuspid regurgitation signal is inadequate for assessing  PA pressure.   3. The mitral valve is normal in structure. No evidence of mitral valve  regurgitation. No evidence of mitral stenosis.   4. The aortic valve is tricuspid. Aortic valve regurgitation is mild.  Mild aortic valve sclerosis is present, with no evidence of aortic valve  stenosis.   5. The inferior vena cava is normal in size with greater than 50%  respiratory variability, suggesting right atrial pressure of 3 mmHg.   EKG:  10/21/2021: thought to be atrial flutter shows an atrial or sinus tachycardia at 150 bpm with 1:1 conductionj.  Last  EKG results: Sinus rhythm with PVC   Recent Labs: No results found for requested labs within last 365 days.          Physical Exam:    VS:  BP 110/68 (BP Location: Left Arm, Patient Position: Sitting, Cuff Size: Normal)   Pulse 65   Ht 5' 4.5" (1.638 m)   Wt 169 lb 6.4 oz (76.8 kg)   SpO2 97%   BMI 28.63 kg/m     Wt Readings from Last 3 Encounters:  12/18/22 169 lb 6.4 oz (76.8  kg)  12/17/21 162 lb (73.5 kg)  12/09/21 163 lb (73.9 kg)     GEN:  Well nourished, well developed in no acute distress CARDIAC: RRR, no murmurs, rubs, gallops RESPIRATORY:  Normal work of breathing MUSCULOSKELETAL: no edema    ASSESSMENT & PLAN:    SVT:  most consistent with atrial tachycardia.   Cannot exclude atrial flutter on some of the EGM's from the Zio patch.   Most episodes detected on her loop recorder have lasted no more than a minute Continue to monitor  Bronchiectasis       I spent 45 minutes on the visit today and a lengthy discussion with the patient.  Signed, Maurice Small, MD  12/18/2022 4:29 PM    [facility]

## 2023-01-04 ENCOUNTER — Ambulatory Visit
Admission: RE | Admit: 2023-01-04 | Discharge: 2023-01-04 | Disposition: A | Discharge status: Home / Self Care | Payer: Medicare Other | Source: Ambulatory Visit | Attending: Orthopedic Surgery | Admitting: Orthopedic Surgery

## 2023-01-04 DIAGNOSIS — Z96642 Presence of left artificial hip joint: Secondary | ICD-10-CM

## 2023-01-04 DIAGNOSIS — M25561 Pain in right knee: Secondary | ICD-10-CM

## 2023-01-05 ENCOUNTER — Ambulatory Visit (INDEPENDENT_AMBULATORY_CARE_PROVIDER_SITE_OTHER): Payer: Medicare Other

## 2023-01-05 DIAGNOSIS — I4892 Unspecified atrial flutter: Secondary | ICD-10-CM

## 2023-01-07 LAB — CUP PACEART REMOTE DEVICE CHECK
Date Time Interrogation Session: 20241111020100
Implantable Pulse Generator Implant Date: 20231024
Pulse Gen Serial Number: 511011009

## 2023-01-16 ENCOUNTER — Ambulatory Visit
Admission: RE | Admit: 2023-01-16 | Discharge: 2023-01-16 | Disposition: A | Discharge status: Home / Self Care | Payer: Medicare Other | Source: Ambulatory Visit | Attending: Orthopedic Surgery | Admitting: Orthopedic Surgery

## 2023-01-28 ENCOUNTER — Other Ambulatory Visit: Payer: Self-pay | Admitting: Cardiovascular Disease

## 2023-01-30 NOTE — Progress Notes (Signed)
[facility]lin Loop Recorder  

## 2023-02-09 ENCOUNTER — Ambulatory Visit (INDEPENDENT_AMBULATORY_CARE_PROVIDER_SITE_OTHER): Payer: Medicare Other

## 2023-02-09 DIAGNOSIS — I4719 Other supraventricular tachycardia: Secondary | ICD-10-CM | POA: Diagnosis not present

## 2023-02-10 LAB — CUP PACEART REMOTE DEVICE CHECK
Date Time Interrogation Session: 20241216020549
Implantable Pulse Generator Implant Date: 20231024
Pulse Gen Serial Number: 511011009

## 2023-02-26 ENCOUNTER — Other Ambulatory Visit: Payer: Self-pay | Admitting: Cardiovascular Disease

## 2023-03-06 ENCOUNTER — Telehealth: Payer: Self-pay | Admitting: Cardiovascular Disease

## 2023-03-06 NOTE — Telephone Encounter (Signed)
 Folow Up:     Patient is returning call. from today..  She dd now who exactly was calling.

## 2023-03-09 NOTE — Telephone Encounter (Signed)
 Attempted to contact patient back, unsure who called her this morning. Have patient recheck her voicemail for further details.

## 2023-03-11 ENCOUNTER — Ambulatory Visit: Payer: Medicare Other | Attending: Orthopedic Surgery

## 2023-03-11 ENCOUNTER — Other Ambulatory Visit: Payer: Self-pay

## 2023-03-11 DIAGNOSIS — G8929 Other chronic pain: Secondary | ICD-10-CM | POA: Diagnosis present

## 2023-03-11 DIAGNOSIS — R262 Difficulty in walking, not elsewhere classified: Secondary | ICD-10-CM | POA: Diagnosis present

## 2023-03-11 DIAGNOSIS — M25552 Pain in left hip: Secondary | ICD-10-CM | POA: Diagnosis present

## 2023-03-11 DIAGNOSIS — M25562 Pain in left knee: Secondary | ICD-10-CM | POA: Diagnosis present

## 2023-03-11 DIAGNOSIS — M6281 Muscle weakness (generalized): Secondary | ICD-10-CM | POA: Insufficient documentation

## 2023-03-11 NOTE — Therapy (Addendum)
 OUTPATIENT PHYSICAL THERAPY LOWER EXTREMITY EVALUATION   Patient Name: Brooke Jimenez MRN: [id number] DOB:May 26, 1945, 78 y.o., female Today's Date: 03/11/2023  END OF SESSION:  PT End of Session - 03/11/23 1420     Visit Number 1    PT Start Time 0200    PT Stop Time 0245    PT Time Calculation (min) 45 min    Activity Tolerance Patient tolerated treatment well             Past Medical History:  Diagnosis Date   Acne rosacea    Anxiety    Arthritis    arthritis in hip    Bronchiectasis (HCC)    Cataract    beginning   Complication of anesthesia    "really sore throat"   Corneal dystrophy    DDD (degenerative disc disease), cervical    and lower back   Diverticulosis    Dysphagia    chronic- please see ultrasound done 4/16 15 in [facility]    Dysrhythmia    sometimes patient has extra beats per patient    Esophageal dysmotility    Family history of adverse reaction to anesthesia    mother slow to wake up    GERD (gastroesophageal reflux disease)    under control   Glaucoma    "suspect"   Gout    one finger x1 attack   H/O hiatal hernia    Hearing loss    bilateral due to nerve loss. wears hearing aides   Heart murmur    no problem    History of chicken pox    History of melanoma    melanoma- 1986 and 1997    Hypothyroidism    Low back pain with sciatica    Measles    hx of   Mitral valve prolapse    Nocturia    Osteoporosis    Peripheral neuropathy    feet   PONV (postoperative nausea and vomiting)    PTSD (post-traumatic stress disorder) 2006   RSD (reflex sympathetic dystrophy) 1993   Spider veins    Urinary incontinence    occasional   Varicose veins    Past Surgical History:  Procedure Laterality Date   ESOPHAGEAL MANOMETRY N/A 12/17/2015   Procedure: ESOPHAGEAL MANOMETRY (EM);  Surgeon: Sergio Dandy, MD;  Location: [facility];  Service: Endoscopy;  Laterality: N/A;   HARDWARE REMOVAL Left 10/17/2013   Procedure: HARDWARE REMOVAL  LEFT HIP;  Surgeon: Aurther Blue, MD;  Location: [facility];  Service: Orthopedics;  Laterality: Left;   HIP FRACTURE SURGERY Left 2006   3 screws   INSERTION OF MESH  02/08/2014   Procedure: INSERTION OF MESH;  Surgeon: Adalberto Hollow, MD;  Location: [facility];  Service: General;;   melanoma surgery   1986, 1997   PARATHYROIDECTOMY  04/02/15   PH IMPEDANCE STUDY N/A 12/17/2015   Procedure: PH IMPEDANCE STUDY;  Surgeon: Sergio Dandy, MD;  Location: [facility];  Service: Endoscopy;  Laterality: N/A;   TOTAL HIP ARTHROPLASTY Left 04/10/2014   Procedure: LEFT TOTAL HIP ARTHROPLASTY ANTERIOR APPROACH;  Surgeon: Aurther Blue, MD;  Location: [facility];  Service: Orthopedics;  Laterality: Left;   UMBILICAL HERNIA REPAIR N/A 02/08/2014   Procedure: HERNIA REPAIR UMBILICAL ADULT WITH MESH;  Surgeon: Adalberto Hollow, MD;  Location: [facility];  Service: General;  Laterality: N/A;   Patient Active Problem List   Diagnosis Date Noted   Reactive monocytosis 11/29/2021   Rosacea 11/29/2021   Upper respiratory  infection 01/15/2018   Postherpetic neuralgia 12/25/2016   Dysesthesia 12/25/2016   Polyneuropathy 12/25/2016   Exposure to influenza 04/01/2016   Dysphagia    Bronchiectasis without acute exacerbation (HCC) 08/30/2015   Bronchiectasis (HCC) 08/14/2015   Chronic rhinitis 08/14/2015   Avascular necrosis of femur head, left (HCC) 04/10/2014   OA (osteoarthritis) of hip 04/10/2014   Hypothyroidism 12/09/2013   Painful orthopaedic hardware (HCC) 10/17/2013    PCP: Dr. Laureen Pon  REFERRING PROVIDER: Dr. Liliane Rei  REFERRING DIAG: Right knee pain with pes bursitis and chondromalacia patella  THERAPY DIAG:  Chronic pain of left knee  Muscle weakness (generalized)  Pain in left hip  Difficulty in walking, not elsewhere classified  Rationale for Evaluation and Treatment: Rehabilitation  ONSET DATE: April 2024  SUBJECTIVE:   SUBJECTIVE STATEMENT: Fell on right knee going into  garage. X-ray with no broken bones, told to keep it moving. Knee has been hurting all over. Ultrasound found to have a small cyst present. Feeling some left hip pain, was replaced. Noted more pain in medial knee than lateral knee, but pain is still present laterally. Denied getting a shot, at Physical therapy to strengthen quads and hamstrings. Hurts most when knee is bent for a period of time then straightening it afterwards.   PERTINENT HISTORY: Hip osteoarthritis Avascular necrosis of left femoral head DDD Dysrhythmia Gout Hearing loss LBP with Sciatica Osteoporosis Peripheral neuropathy (feet) Reflex Sympathetic Dystrophy PAIN:  Are you having pain? Yes: NPRS scale: 4-5/10 Pain location: left knee Pain description: sharp, dull, and achy Aggravating factors: bent for long time, steps, walking for a long period of time, standing for long periods, general movement Relieving factors: medication  PRECAUTIONS: Fall  RED FLAGS: None   WEIGHT BEARING RESTRICTIONS: No  FALLS:  Has patient fallen in last 6 months? Yes. Number of falls 1  LIVING ENVIRONMENT: Lives with: lives with their family and lives with their spouse Lives in: House/apartment Stairs: Yes: Internal: 1 flight steps; on right going up and External: 3 steps; on right going up Has following equipment at home: Single point cane, Walker - 2 wheeled, and Environmental consultant - 4 wheeled  OCCUPATION: retired  PLOF: Independent and Independent with basic ADLs  PATIENT GOALS: walk normally, gain confidence with gait  NEXT MD VISIT: none set   OBJECTIVE:  Note: Objective measures were completed at Evaluation unless otherwise noted.  DIAGNOSTIC FINDINGS: cartilage loss ine right knee  PATIENT SURVEYS:  FOTO 43  COGNITION: Overall cognitive status: Within functional limits for tasks assessed      POSTURE: rounded shoulders  PALPATION: Medial and Lateral right knee TTP  LOWER EXTREMITY ROM:  Active ROM Right eval  Left eval  Hip flexion [facility]  Hip extension    Hip abduction    Hip adduction    Hip internal rotation    Hip external rotation    Knee flexion WFL* WFL  Knee extension Lack 25* WFL  Ankle dorsiflexion [facility]  Ankle plantarflexion [facility]  Ankle inversion    Ankle eversion     (Blank rows = not tested, * = pain)  LOWER EXTREMITY MMT:  MMT Right eval Left eval  Hip flexion 4/5 4/5  Hip extension    Hip abduction 4/5 4/5  Hip adduction 4/5 4/5  Hip internal rotation    Hip external rotation    Knee flexion 3+/5 4-/5  Knee extension 3+/5* 4-/5  Ankle dorsiflexion    Ankle plantarflexion    Ankle  inversion    Ankle eversion     (Blank rows = not tested, * = pain)   FUNCTIONAL TESTS:  5 times sit to stand: 33 seconds Timed up and go (TUG): 23 seconds                                                                                                                               TREATMENT DATE:  03/11/23: Eval    PATIENT EDUCATION:  Education details: POC and HEP Person educated: Patient Education method: Explanation Education comprehension: verbalized understanding  HOME EXERCISE PROGRAM: Standing Knee flexion at the counter, 2x10 Standing Marches at the counter, 2x10 Seated LAQ, 2x10  ASSESSMENT:  CLINICAL IMPRESSION: Patient is a 78 y.o. woman who was seen today for physical therapy evaluation and treatment for right knee pain with pes bursitis and chondromalacia patella. She has generalized weakness in both LEs. The right knee seems to be limited by pain as well. She has decreased stance time on her right leg most likely due to pain. She demonstrate lack of knee extension by about 25d. Pt will benefit from skilled PT to help increase strength in both LEs and decrease pain in the knee to improve QOL and gait pattern as well as ease with ADLs.  OBJECTIVE IMPAIRMENTS: Abnormal gait, cardiopulmonary status limiting activity, decreased balance, decreased  coordination, decreased endurance, difficulty walking, decreased ROM, decreased strength, and pain.   ACTIVITY LIMITATIONS: carrying, lifting, bending, sitting, standing, squatting, stairs, transfers, bed mobility, and continence  PARTICIPATION LIMITATIONS: cleaning, laundry, and community activity  PERSONAL FACTORS: Age, Fitness, and Time since onset of injury/illness/exacerbation are also affecting patient's functional outcome.   REHAB POTENTIAL: Good  CLINICAL DECISION MAKING: Stable/uncomplicated  EVALUATION COMPLEXITY: Low   GOALS: Goals reviewed with patient? Yes  SHORT TERM GOALS: Target date: 04/15/23 Patient will be independent with initial home program.  Baseline: Goal status: INITIAL  2.  Pt will improve 5x STS to 25 seconds. Baseline: 33s Goal status: INITIAL  LONG TERM GOALS: Target date: 05/20/23  Pt will be independent with long term HEP for carryover and improvement of ADL difficulty. Baseline:  Goal status: INITIAL  2.  Pt will improve 5x STS to 20 seconds. Baseline: 33 sec (03/11/23) Goal status: INITIAL  3.  Pt will improve TUG to 17 sec Baseline: 23 sec (03/11/23) Goal status: INITIAL  4. Pt will improve knee strength to 4/5 or better Baseline: 3+/5 with pain Goal status: INITIAL    PLAN:  PT FREQUENCY: 2x/week  PT DURATION: 10 weeks  PLANNED INTERVENTIONS: 97110-Therapeutic exercises, 97530- Therapeutic activity, 97112- Neuromuscular re-education, 97535- Self Care, and 40981- Manual therapy  PLAN FOR NEXT SESSION: decrease pain, strengthen quads and hamstrings, gait training   Donavon Fudge, PT 03/11/2023, 3:51 PM

## 2023-03-13 ENCOUNTER — Other Ambulatory Visit: Payer: Self-pay | Admitting: Cardiovascular Disease

## 2023-03-16 ENCOUNTER — Ambulatory Visit (INDEPENDENT_AMBULATORY_CARE_PROVIDER_SITE_OTHER): Payer: Medicare Other

## 2023-03-16 DIAGNOSIS — I4892 Unspecified atrial flutter: Secondary | ICD-10-CM

## 2023-03-17 ENCOUNTER — Encounter: Payer: Self-pay | Admitting: Physical Therapy

## 2023-03-17 ENCOUNTER — Ambulatory Visit: Payer: Medicare Other | Admitting: Physical Therapy

## 2023-03-17 DIAGNOSIS — M25562 Pain in left knee: Secondary | ICD-10-CM | POA: Diagnosis not present

## 2023-03-17 DIAGNOSIS — M6281 Muscle weakness (generalized): Secondary | ICD-10-CM

## 2023-03-17 DIAGNOSIS — G8929 Other chronic pain: Secondary | ICD-10-CM

## 2023-03-17 DIAGNOSIS — R262 Difficulty in walking, not elsewhere classified: Secondary | ICD-10-CM

## 2023-03-17 LAB — CUP PACEART REMOTE DEVICE CHECK
Date Time Interrogation Session: 20250120231715
Implantable Pulse Generator Implant Date: 20231024
Pulse Gen Serial Number: 511011009

## 2023-03-17 NOTE — Progress Notes (Signed)
[facility]lin Loop Recorder  

## 2023-03-17 NOTE — Addendum Note (Signed)
Addended by: Geralyn Flash D on: 03/17/2023 11:36 AM   Modules accepted: Orders

## 2023-03-17 NOTE — Therapy (Signed)
OUTPATIENT PHYSICAL THERAPY LOWER EXTREMITY EVALUATION   Patient Name: Brooke Jimenez MRN: [id number] DOB:September 16, 1945, 78 y.o., female Today's Date: 03/17/2023  END OF SESSION:  PT End of Session - 03/17/23 1303     Visit Number 2    Date for PT Re-Evaluation 05/20/23    PT Start Time 1300    PT Stop Time 1345    PT Time Calculation (min) 45 min    Activity Tolerance Patient tolerated treatment well    Behavior During Therapy WFL for tasks assessed/performed             Past Medical History:  Diagnosis Date   Acne rosacea    Anxiety    Arthritis    arthritis in hip    Bronchiectasis (HCC)    Cataract    beginning   Complication of anesthesia    "really sore throat"   Corneal dystrophy    DDD (degenerative disc disease), cervical    and lower back   Diverticulosis    Dysphagia    chronic- please see ultrasound done 4/16 15 in [facility]    Dysrhythmia    sometimes patient has extra beats per patient    Esophageal dysmotility    Family history of adverse reaction to anesthesia    mother slow to wake up    GERD (gastroesophageal reflux disease)    under control   Glaucoma    "suspect"   Gout    one finger x1 attack   H/O hiatal hernia    Hearing loss    bilateral due to nerve loss. wears hearing aides   Heart murmur    no problem    History of chicken pox    History of melanoma    melanoma- 1986 and 1997    Hypothyroidism    Low back pain with sciatica    Measles    hx of   Mitral valve prolapse    Nocturia    Osteoporosis    Peripheral neuropathy    feet   PONV (postoperative nausea and vomiting)    PTSD (post-traumatic stress disorder) 2006   RSD (reflex sympathetic dystrophy) 1993   Spider veins    Urinary incontinence    occasional   Varicose veins    Past Surgical History:  Procedure Laterality Date   ESOPHAGEAL MANOMETRY N/A 12/17/2015   Procedure: ESOPHAGEAL MANOMETRY (EM);  Surgeon: Napoleon Form, MD;  [facility]: [facility];   Service: Endoscopy;  Laterality: N/A;   HARDWARE REMOVAL Left 10/17/2013   Procedure: HARDWARE REMOVAL LEFT HIP;  Surgeon: Loanne Drilling, MD;  Location: [facility];  Service: Orthopedics;  Laterality: Left;   HIP FRACTURE SURGERY Left 2006   3 screws   INSERTION OF MESH  02/08/2014   Procedure: INSERTION OF MESH;  Surgeon: Avel Peace, MD;  Location: [facility];  Service: General;;   melanoma surgery   1986, 1997   PARATHYROIDECTOMY  04/02/15   PH IMPEDANCE STUDY N/A 12/17/2015   Procedure: PH IMPEDANCE STUDY;  Surgeon: Napoleon Form, MD;  [facility]: [facility];  Service: Endoscopy;  Laterality: N/A;   TOTAL HIP ARTHROPLASTY Left 04/10/2014   Procedure: LEFT TOTAL HIP ARTHROPLASTY ANTERIOR APPROACH;  Surgeon: Loanne Drilling, MD;  Location: [facility];  Service: Orthopedics;  Laterality: Left;   UMBILICAL HERNIA REPAIR N/A 02/08/2014   Procedure: HERNIA REPAIR UMBILICAL ADULT WITH MESH;  Surgeon: Avel Peace, MD;  Location: [facility];  Service: General;  Laterality: N/A;   Patient Active Problem List  Diagnosis Date Noted   Reactive monocytosis 11/29/2021   Rosacea 11/29/2021   Upper respiratory infection 01/15/2018   Postherpetic neuralgia 12/25/2016   Dysesthesia 12/25/2016   Polyneuropathy 12/25/2016   Exposure to influenza 04/01/2016   Dysphagia    Bronchiectasis without acute exacerbation (HCC) 08/30/2015   Bronchiectasis (HCC) 08/14/2015   Chronic rhinitis 08/14/2015   Avascular necrosis of femur head, left (HCC) 04/10/2014   OA (osteoarthritis) of hip 04/10/2014   Hypothyroidism 12/09/2013   Painful orthopaedic hardware (HCC) 10/17/2013    PCP: Dr. Ninetta Lights  REFERRING PROVIDER: Dr. Ollen Gross  REFERRING DIAG: Right knee pain with pes bursitis and chondromalacia patella  THERAPY DIAG:  Chronic pain of left knee  Muscle weakness (generalized)  Difficulty in walking, not elsewhere classified  Rationale for Evaluation and Treatment: Rehabilitation  ONSET DATE:  April 2024  SUBJECTIVE:   SUBJECTIVE STATEMENT: Sore, pt made a point to say it was her R knee having th issues not he left that's eh seen from the after visit summary.   PERTINENT HISTORY: Hip osteoarthritis Avascular necrosis of left femoral head DDD Dysrhythmia Gout Hearing loss LBP with Sciatica Osteoporosis Peripheral neuropathy (feet) Reflex Sympathetic Dystrophy PAIN:  Are you having pain? Yes: NPRS scale: 4-5/10 Pain location: left knee Pain description: sharp, dull, and achy Aggravating factors: bent for long time, steps, walking for a long period of time, standing for long periods, general movement Relieving factors: medication  PRECAUTIONS: Fall  RED FLAGS: None   WEIGHT BEARING RESTRICTIONS: No  FALLS:  Has patient fallen in last 6 months? Yes. Number of falls 1  LIVING ENVIRONMENT: Lives with: lives with their family and lives with their spouse Lives in: House/apartment Stairs: Yes: Internal: 1 flight steps; on right going up and External: 3 steps; on right going up Has following equipment at home: Single point cane, Walker - 2 wheeled, and Environmental consultant - 4 wheeled  OCCUPATION: retired  PLOF: Independent and Independent with basic ADLs  PATIENT GOALS: walk normally, gain confidence with gait  NEXT MD VISIT: none set   OBJECTIVE:  Note: Objective measures were completed at Evaluation unless otherwise noted.  DIAGNOSTIC FINDINGS: cartilage loss ine right knee  PATIENT SURVEYS:  FOTO 43  COGNITION: Overall cognitive status: Within functional limits for tasks assessed      POSTURE: rounded shoulders  PALPATION: Medial and Lateral right knee TTP  LOWER EXTREMITY ROM:  Active ROM Right eval Left eval  Hip flexion [facility]  Hip extension    Hip abduction    Hip adduction    Hip internal rotation    Hip external rotation    Knee flexion WFL* WFL  Knee extension Lack 25* WFL  Ankle dorsiflexion [facility]  Ankle plantarflexion [facility] WFL  Ankle  inversion    Ankle eversion     (Blank rows = not tested, * = pain)  LOWER EXTREMITY MMT:  MMT Right eval Left eval  Hip flexion 4/5 4/5  Hip extension    Hip abduction 4/5 4/5  Hip adduction 4/5 4/5  Hip internal rotation    Hip external rotation    Knee flexion 3+/5 4-/5  Knee extension 3+/5* 4-/5  Ankle dorsiflexion    Ankle plantarflexion    Ankle inversion    Ankle eversion     (Blank rows = not tested, * = pain)   FUNCTIONAL TESTS:  5 times sit to stand: 33 seconds Timed up and go (TUG): 23 seconds  TREATMENT DATE:  03/17/23 NuStep L 4 x 6 min MT  STM to R kne and surrounding streutures  R knee patellar mobs  R knee PROM LAQ RLE 2lb x10, x5 HS curls RLE  yellow x10, x5  Hip add blue ball 2x10 Hip abd red bald 2x10  03/11/23: Eval    PATIENT EDUCATION:  Education details: POC and HEP Person educated: Patient Education method: Explanation Education comprehension: verbalized understanding  HOME EXERCISE PROGRAM: Standing Knee flexion at the counter, 2x10 Standing Marches at the counter, 2x10 Seated LAQ, 2x10  ASSESSMENT:  CLINICAL IMPRESSION: Patient is a 78 y.o. woman who was seen today for physical therapy and treatment for right knee pain with pes bursitis and chondromalacia patella. She has generalized weakness in both LEs. The right knee seems to be limited by pain as well. Pt enters doing well overall. SH has knee pain during session but he location was not consistent, pain seem to move around session. Limited R knee ROM with HS curls present due to the "anticipation" of pain. Cues for to hold R quad contraction with LAQ.   Pt will benefit from skilled PT to help increase strength in both LEs and decrease pain in the knee to improve QOL and gait pattern as well as ease with ADLs.  OBJECTIVE IMPAIRMENTS: Abnormal gait,  cardiopulmonary status limiting activity, decreased balance, decreased coordination, decreased endurance, difficulty walking, decreased ROM, decreased strength, and pain.   ACTIVITY LIMITATIONS: carrying, lifting, bending, sitting, standing, squatting, stairs, transfers, bed mobility, and continence  PARTICIPATION LIMITATIONS: cleaning, laundry, and community activity  PERSONAL FACTORS: Age, Fitness, and Time since onset of injury/illness/exacerbation are also affecting patient's functional outcome.   REHAB POTENTIAL: Good  CLINICAL DECISION MAKING: Stable/uncomplicated  EVALUATION COMPLEXITY: Low   GOALS: Goals reviewed with patient? Yes  SHORT TERM GOALS: Target date: 04/15/23 Patient will be independent with initial home program.  Baseline: Goal status: INITIAL  2.  Pt will improve 5x STS to 25 seconds. Baseline: 33s Goal status: INITIAL  LONG TERM GOALS: Target date: 05/20/23  Pt will be independent with long term HEP for carryover and improvement of ADL difficulty. Baseline:  Goal status: INITIAL  2.  Pt will improve 5x STS to 20 seconds. Baseline: 33 sec (03/11/23) Goal status: INITIAL  3.  Pt will improve TUG to 17 sec Baseline: 23 sec (03/11/23) Goal status: INITIAL  4. Pt will improve knee strength to 4/5 or better Baseline: 3+/5 with pain Goal status: INITIAL    PLAN:  PT FREQUENCY: 2x/week  PT DURATION: 10 weeks  PLANNED INTERVENTIONS: 97110-Therapeutic exercises, 97530- Therapeutic activity, 97112- Neuromuscular re-education, 97535- Self Care, and 16109- Manual therapy  PLAN FOR NEXT SESSION: decrease pain, strengthen quads and hamstrings, gait training   Grayce Sessions, PTA 03/17/2023, 1:04 PM

## 2023-03-19 ENCOUNTER — Ambulatory Visit: Payer: Medicare Other | Admitting: Physical Therapy

## 2023-03-19 ENCOUNTER — Encounter: Payer: Self-pay | Admitting: Physical Therapy

## 2023-03-19 DIAGNOSIS — M25562 Pain in left knee: Secondary | ICD-10-CM | POA: Diagnosis not present

## 2023-03-19 DIAGNOSIS — R262 Difficulty in walking, not elsewhere classified: Secondary | ICD-10-CM

## 2023-03-19 DIAGNOSIS — M6281 Muscle weakness (generalized): Secondary | ICD-10-CM

## 2023-03-19 DIAGNOSIS — G8929 Other chronic pain: Secondary | ICD-10-CM

## 2023-03-19 NOTE — Therapy (Signed)
OUTPATIENT PHYSICAL THERAPY LOWER EXTREMITY TREATMENT   Patient Name: Brooke Jimenez MRN: [id number] DOB:August 25, 1945, 78 y.o., female Today's Date: 03/19/2023  END OF SESSION:  PT End of Session - 03/19/23 1523     Visit Number 3    Date for PT Re-Evaluation 05/20/23    PT Start Time 1523    PT Stop Time 1600    PT Time Calculation (min) 37 min    Activity Tolerance Patient tolerated treatment well    Behavior During Therapy WFL for tasks assessed/performed             Past Medical History:  Diagnosis Date   Acne rosacea    Anxiety    Arthritis    arthritis in hip    Bronchiectasis (HCC)    Cataract    beginning   Complication of anesthesia    "really sore throat"   Corneal dystrophy    DDD (degenerative disc disease), cervical    and lower back   Diverticulosis    Dysphagia    chronic- please see ultrasound done 4/16 15 in [facility]    Dysrhythmia    sometimes patient has extra beats per patient    Esophageal dysmotility    Family history of adverse reaction to anesthesia    mother slow to wake up    GERD (gastroesophageal reflux disease)    under control   Glaucoma    "suspect"   Gout    one finger x1 attack   H/O hiatal hernia    Hearing loss    bilateral due to nerve loss. wears hearing aides   Heart murmur    no problem    History of chicken pox    History of melanoma    melanoma- 1986 and 1997    Hypothyroidism    Low back pain with sciatica    Measles    hx of   Mitral valve prolapse    Nocturia    Osteoporosis    Peripheral neuropathy    feet   PONV (postoperative nausea and vomiting)    PTSD (post-traumatic stress disorder) 2006   RSD (reflex sympathetic dystrophy) 1993   Spider veins    Urinary incontinence    occasional   Varicose veins    Past Surgical History:  Procedure Laterality Date   ESOPHAGEAL MANOMETRY N/A 12/17/2015   Procedure: ESOPHAGEAL MANOMETRY (EM);  Surgeon: Napoleon Form, MD;  Location: [facility];   Service: Endoscopy;  Laterality: N/A;   HARDWARE REMOVAL Left 10/17/2013   Procedure: HARDWARE REMOVAL LEFT HIP;  Surgeon: Loanne Drilling, MD;  Location: [facility];  Service: Orthopedics;  Laterality: Left;   HIP FRACTURE SURGERY Left 2006   3 screws   INSERTION OF MESH  02/08/2014   Procedure: INSERTION OF MESH;  Surgeon: Avel Peace, MD;  Location: [facility];  Service: General;;   melanoma surgery   1986, 1997   PARATHYROIDECTOMY  04/02/15   PH IMPEDANCE STUDY N/A 12/17/2015   Procedure: PH IMPEDANCE STUDY;  Surgeon: Napoleon Form, MD;  Location: [facility];  Service: Endoscopy;  Laterality: N/A;   TOTAL HIP ARTHROPLASTY Left 04/10/2014   Procedure: LEFT TOTAL HIP ARTHROPLASTY ANTERIOR APPROACH;  Surgeon: Loanne Drilling, MD;  Location: [facility];  Service: Orthopedics;  Laterality: Left;   UMBILICAL HERNIA REPAIR N/A 02/08/2014   Procedure: HERNIA REPAIR UMBILICAL ADULT WITH MESH;  Surgeon: Avel Peace, MD;  Location: [facility];  Service: General;  Laterality: N/A;   Patient Active Problem List  Diagnosis Date Noted   Reactive monocytosis 11/29/2021   Rosacea 11/29/2021   Upper respiratory infection 01/15/2018   Postherpetic neuralgia 12/25/2016   Dysesthesia 12/25/2016   Polyneuropathy 12/25/2016   Exposure to influenza 04/01/2016   Dysphagia    Bronchiectasis without acute exacerbation (HCC) 08/30/2015   Bronchiectasis (HCC) 08/14/2015   Chronic rhinitis 08/14/2015   Avascular necrosis of femur head, left (HCC) 04/10/2014   OA (osteoarthritis) of hip 04/10/2014   Hypothyroidism 12/09/2013   Painful orthopaedic hardware (HCC) 10/17/2013    PCP: Dr. Ninetta Lights  REFERRING PROVIDER: Dr. Ollen Gross  REFERRING DIAG: Right knee pain with pes bursitis and chondromalacia patella  THERAPY DIAG:  Chronic pain of left knee  Muscle weakness (generalized)  Difficulty in walking, not elsewhere classified  Rationale for Evaluation and Treatment: Rehabilitation  ONSET DATE:  April 2024  SUBJECTIVE:   SUBJECTIVE STATEMENT: "I think I'm a little better"  PERTINENT HISTORY: Hip osteoarthritis Avascular necrosis of left femoral head DDD Dysrhythmia Gout Hearing loss LBP with Sciatica Osteoporosis Peripheral neuropathy (feet) Reflex Sympathetic Dystrophy PAIN:  Are you having pain? Yes: NPRS scale: 4-5/10 Pain location: left knee Pain description: sharp, dull, and achy Aggravating factors: bent for long time, steps, walking for a long period of time, standing for long periods, general movement Relieving factors: medication  PRECAUTIONS: Fall  RED FLAGS: None   WEIGHT BEARING RESTRICTIONS: No  FALLS:  Has patient fallen in last 6 months? Yes. Number of falls 1  LIVING ENVIRONMENT: Lives with: lives with their family and lives with their spouse Lives in: House/apartment Stairs: Yes: Internal: 1 flight steps; on right going up and External: 3 steps; on right going up Has following equipment at home: Single point cane, Walker - 2 wheeled, and Environmental consultant - 4 wheeled  OCCUPATION: retired  PLOF: Independent and Independent with basic ADLs  PATIENT GOALS: walk normally, gain confidence with gait  NEXT MD VISIT: none set   OBJECTIVE:  Note: Objective measures were completed at Evaluation unless otherwise noted.  DIAGNOSTIC FINDINGS: cartilage loss ine right knee  PATIENT SURVEYS:  FOTO 43  COGNITION: Overall cognitive status: Within functional limits for tasks assessed      POSTURE: rounded shoulders  PALPATION: Medial and Lateral right knee TTP  LOWER EXTREMITY ROM:  Active ROM Right eval Left eval  Hip flexion [facility]  Hip extension    Hip abduction    Hip adduction    Hip internal rotation    Hip external rotation    Knee flexion WFL* WFL  Knee extension Lack 25* WFL  Ankle dorsiflexion [facility]  Ankle plantarflexion [facility] WFL  Ankle inversion    Ankle eversion     (Blank rows = not tested, * = pain)  LOWER EXTREMITY  MMT:  MMT Right eval Left eval  Hip flexion 4/5 4/5  Hip extension    Hip abduction 4/5 4/5  Hip adduction 4/5 4/5  Hip internal rotation    Hip external rotation    Knee flexion 3+/5 4-/5  Knee extension 3+/5* 4-/5  Ankle dorsiflexion    Ankle plantarflexion    Ankle inversion    Ankle eversion     (Blank rows = not tested, * = pain)   FUNCTIONAL TESTS:  5 times sit to stand: 33 seconds Timed up and go (TUG): 23 seconds  TREATMENT DATE:  03/19/23 NuStep L 4 x 6 min MT  STM to R kne and surrounding streutures  R knee patellar mobs  R knee PROM LAQ RLE 2lb 2x10 HS curls RLE  yellow 2x10 S2S 2x5 each 4in step ups 2x5 each Slant board calf stretch   03/17/23 NuStep L 4 x 6 min MT  STM to R kne and surrounding streutures  R knee patellar mobs  R knee PROM LAQ RLE 2lb x10, x5 HS curls RLE  yellow x10, x5  Hip add blue ball 2x10 Hip abd red bald 2x10  03/11/23: Eval    PATIENT EDUCATION:  Education details: POC and HEP Person educated: Patient Education method: Explanation Education comprehension: verbalized understanding  HOME EXERCISE PROGRAM: Standing Knee flexion at the counter, 2x10 Standing Marches at the counter, 2x10 Seated LAQ, 2x10  ASSESSMENT:  CLINICAL IMPRESSION: Patient is a 78 y.o. woman who was seen today for physical therapy and treatment for right knee pain with pes bursitis and chondromalacia patella. She has generalized weakness in both LEs. The right knee seems to be limited by pain as well. Pt enters with reports of slight improvement when waking up. Some R shaft pain repots with the first set of 4 inch step ups. Again cues for to hold R quad contraction with LAQ.   Pt will benefit from skilled PT to help increase strength in both LEs and decrease pain in the knee to improve QOL and gait pattern as well as ease with  ADLs.  OBJECTIVE IMPAIRMENTS: Abnormal gait, cardiopulmonary status limiting activity, decreased balance, decreased coordination, decreased endurance, difficulty walking, decreased ROM, decreased strength, and pain.   ACTIVITY LIMITATIONS: carrying, lifting, bending, sitting, standing, squatting, stairs, transfers, bed mobility, and continence  PARTICIPATION LIMITATIONS: cleaning, laundry, and community activity  PERSONAL FACTORS: Age, Fitness, and Time since onset of injury/illness/exacerbation are also affecting patient's functional outcome.   REHAB POTENTIAL: Good  CLINICAL DECISION MAKING: Stable/uncomplicated  EVALUATION COMPLEXITY: Low   GOALS: Goals reviewed with patient? Yes  SHORT TERM GOALS: Target date: 04/15/23 Patient will be independent with initial home program.  Baseline: Goal status: INITIAL  2.  Pt will improve 5x STS to 25 seconds. Baseline: 33s Goal status: INITIAL  LONG TERM GOALS: Target date: 05/20/23  Pt will be independent with long term HEP for carryover and improvement of ADL difficulty. Baseline:  Goal status: INITIAL  2.  Pt will improve 5x STS to 20 seconds. Baseline: 33 sec (03/11/23) Goal status: INITIAL  3.  Pt will improve TUG to 17 sec Baseline: 23 sec (03/11/23) Goal status: INITIAL  4. Pt will improve knee strength to 4/5 or better Baseline: 3+/5 with pain Goal status: INITIAL    PLAN:  PT FREQUENCY: 2x/week  PT DURATION: 10 weeks  PLANNED INTERVENTIONS: 97110-Therapeutic exercises, 97530- Therapeutic activity, 97112- Neuromuscular re-education, 97535- Self Care, and 40981- Manual therapy  PLAN FOR NEXT SESSION: decrease pain, strengthen quads and hamstrings, gait training   Grayce Sessions, PTA 03/19/2023, 3:25 PM

## 2023-03-21 ENCOUNTER — Encounter: Payer: Self-pay | Admitting: Cardiovascular Disease

## 2023-03-24 ENCOUNTER — Encounter: Payer: Self-pay | Admitting: Physical Therapy

## 2023-03-24 ENCOUNTER — Ambulatory Visit: Payer: Medicare Other | Admitting: Physical Therapy

## 2023-03-24 DIAGNOSIS — R262 Difficulty in walking, not elsewhere classified: Secondary | ICD-10-CM

## 2023-03-24 DIAGNOSIS — M25562 Pain in left knee: Secondary | ICD-10-CM | POA: Diagnosis not present

## 2023-03-24 DIAGNOSIS — M25552 Pain in left hip: Secondary | ICD-10-CM

## 2023-03-24 DIAGNOSIS — G8929 Other chronic pain: Secondary | ICD-10-CM

## 2023-03-24 DIAGNOSIS — M6281 Muscle weakness (generalized): Secondary | ICD-10-CM

## 2023-03-24 NOTE — Therapy (Signed)
OUTPATIENT PHYSICAL THERAPY LOWER EXTREMITY TREATMENT   Patient Name: Brooke Jimenez MRN: [id number] DOB:December 04, 1945, 78 y.o., female Today's Date: 03/24/2023  END OF SESSION:  PT End of Session - 03/24/23 1306     Visit Number 4    Date for PT Re-Evaluation 05/20/23    PT Start Time 1300    PT Stop Time 1345    PT Time Calculation (min) 45 min    Activity Tolerance Patient tolerated treatment well    Behavior During Therapy WFL for tasks assessed/performed             Past Medical History:  Diagnosis Date   Acne rosacea    Anxiety    Arthritis    arthritis in hip    Bronchiectasis (HCC)    Cataract    beginning   Complication of anesthesia    "really sore throat"   Corneal dystrophy    DDD (degenerative disc disease), cervical    and lower back   Diverticulosis    Dysphagia    chronic- please see ultrasound done 4/16 15 in [facility]    Dysrhythmia    sometimes patient has extra beats per patient    Esophageal dysmotility    Family history of adverse reaction to anesthesia    mother slow to wake up    GERD (gastroesophageal reflux disease)    under control   Glaucoma    "suspect"   Gout    one finger x1 attack   H/O hiatal hernia    Hearing loss    bilateral due to nerve loss. wears hearing aides   Heart murmur    no problem    History of chicken pox    History of melanoma    melanoma- 1986 and 1997    Hypothyroidism    Low back pain with sciatica    Measles    hx of   Mitral valve prolapse    Nocturia    Osteoporosis    Peripheral neuropathy    feet   PONV (postoperative nausea and vomiting)    PTSD (post-traumatic stress disorder) 2006   RSD (reflex sympathetic dystrophy) 1993   Spider veins    Urinary incontinence    occasional   Varicose veins    Past Surgical History:  Procedure Laterality Date   ESOPHAGEAL MANOMETRY N/A 12/17/2015   Procedure: ESOPHAGEAL MANOMETRY (EM);  Surgeon: Napoleon Form, MD;  Location: [facility];   Service: Endoscopy;  Laterality: N/A;   HARDWARE REMOVAL Left 10/17/2013   Procedure: HARDWARE REMOVAL LEFT HIP;  Surgeon: Loanne Drilling, MD;  Location: [facility];  Service: Orthopedics;  Laterality: Left;   HIP FRACTURE SURGERY Left 2006   3 screws   INSERTION OF MESH  02/08/2014   Procedure: INSERTION OF MESH;  Surgeon: Avel Peace, MD;  Location: [facility];  Service: General;;   melanoma surgery   1986, 1997   PARATHYROIDECTOMY  04/02/15   PH IMPEDANCE STUDY N/A 12/17/2015   Procedure: PH IMPEDANCE STUDY;  Surgeon: Napoleon Form, MD;  Location: [facility];  Service: Endoscopy;  Laterality: N/A;   TOTAL HIP ARTHROPLASTY Left 04/10/2014   Procedure: LEFT TOTAL HIP ARTHROPLASTY ANTERIOR APPROACH;  Surgeon: Loanne Drilling, MD;  Location: [facility];  Service: Orthopedics;  Laterality: Left;   UMBILICAL HERNIA REPAIR N/A 02/08/2014   Procedure: HERNIA REPAIR UMBILICAL ADULT WITH MESH;  Surgeon: Avel Peace, MD;  Location: [facility];  Service: General;  Laterality: N/A;   Patient Active Problem List  Diagnosis Date Noted   Reactive monocytosis 11/29/2021   Rosacea 11/29/2021   Upper respiratory infection 01/15/2018   Postherpetic neuralgia 12/25/2016   Dysesthesia 12/25/2016   Polyneuropathy 12/25/2016   Exposure to influenza 04/01/2016   Dysphagia    Bronchiectasis without acute exacerbation (HCC) 08/30/2015   Bronchiectasis (HCC) 08/14/2015   Chronic rhinitis 08/14/2015   Avascular necrosis of femur head, left (HCC) 04/10/2014   OA (osteoarthritis) of hip 04/10/2014   Hypothyroidism 12/09/2013   Painful orthopaedic hardware (HCC) 10/17/2013    PCP: Dr. Ninetta Lights  REFERRING PROVIDER: Dr. Ollen Gross  REFERRING DIAG: Right knee pain with pes bursitis and chondromalacia patella  THERAPY DIAG:  Chronic pain of left knee  Muscle weakness (generalized)  Difficulty in walking, not elsewhere classified  Pain in left hip  Rationale for Evaluation and Treatment:  Rehabilitation  ONSET DATE: April 2024  SUBJECTIVE:   SUBJECTIVE STATEMENT: Maybe a little better, slightly less pain when she first gets up, walking a little easier  PERTINENT HISTORY: Hip osteoarthritis Avascular necrosis of left femoral head DDD Dysrhythmia Gout Hearing loss LBP with Sciatica Osteoporosis Peripheral neuropathy (feet) Reflex Sympathetic Dystrophy PAIN:  Are you having pain? Yes: NPRS scale: 4/10 Pain location: lower back Pain description: sharp, dull, and achy Aggravating factors: bent for long time, steps, walking for a long period of time, standing for long periods, general movement Relieving factors: medication  PRECAUTIONS: Fall  RED FLAGS: None   WEIGHT BEARING RESTRICTIONS: No  FALLS:  Has patient fallen in last 6 months? Yes. Number of falls 1  LIVING ENVIRONMENT: Lives with: lives with their family and lives with their spouse Lives in: House/apartment Stairs: Yes: Internal: 1 flight steps; on right going up and External: 3 steps; on right going up Has following equipment at home: Single point cane, Walker - 2 wheeled, and Environmental consultant - 4 wheeled  OCCUPATION: retired  PLOF: Independent and Independent with basic ADLs  PATIENT GOALS: walk normally, gain confidence with gait  NEXT MD VISIT: none set   OBJECTIVE:  Note: Objective measures were completed at Evaluation unless otherwise noted.  DIAGNOSTIC FINDINGS: cartilage loss ine right knee  PATIENT SURVEYS:  FOTO 43  COGNITION: Overall cognitive status: Within functional limits for tasks assessed      POSTURE: rounded shoulders  PALPATION: Medial and Lateral right knee TTP  LOWER EXTREMITY ROM:  Active ROM Right eval Left eval  Hip flexion [facility]  Hip extension    Hip abduction    Hip adduction    Hip internal rotation    Hip external rotation    Knee flexion WFL* WFL  Knee extension Lack 25* WFL  Ankle dorsiflexion [facility]  Ankle plantarflexion [facility] WFL  Ankle  inversion    Ankle eversion     (Blank rows = not tested, * = pain)  LOWER EXTREMITY MMT:  MMT Right eval Left eval  Hip flexion 4/5 4/5  Hip extension    Hip abduction 4/5 4/5  Hip adduction 4/5 4/5  Hip internal rotation    Hip external rotation    Knee flexion 3+/5 4-/5  Knee extension 3+/5* 4-/5  Ankle dorsiflexion    Ankle plantarflexion    Ankle inversion    Ankle eversion     (Blank rows = not tested, * = pain)   FUNCTIONAL TESTS:  5 times sit to stand: 33 seconds Timed up and go (TUG): 23 seconds  TREATMENT DATE:  03/24/23 NuStep L 4 x 6 min Resisted gait forward backward 20lb x3 4in lateral step ups x10 HHAx1 S2S 2x10 HS Curls 20lb 2x10  Leg Ext 5lb 2x10 MT  STM to R kne and surrounding streutures  R knee patellar mobs  R knee PROM  03/19/23 NuStep L 4 x 6 min MT  STM to R kne and surrounding streutures  R knee patellar mobs  R knee PROM LAQ RLE 2lb 2x10 HS curls RLE  yellow 2x10 S2S 2x5 each 4in step ups 2x5 each Slant board calf stretch   03/17/23 NuStep L 4 x 6 min MT  STM to R kne and surrounding streutures  R knee patellar mobs  R knee PROM LAQ RLE 2lb x10, x5 HS curls RLE  yellow x10, x5  Hip add blue ball 2x10 Hip abd red bald 2x10  03/11/23: Eval    PATIENT EDUCATION:  Education details: POC and HEP Person educated: Patient Education method: Explanation Education comprehension: verbalized understanding  HOME EXERCISE PROGRAM: Standing Knee flexion at the counter, 2x10 Standing Marches at the counter, 2x10 Seated LAQ, 2x10  ASSESSMENT:  CLINICAL IMPRESSION: Patient is a 78 y.o. woman who was seen today for physical therapy and treatment for right knee pain with pes bursitis and chondromalacia patella. She has generalized weakness in both LEs. Again pt enters with reports of slight improvement with her  R knee. Some LE weakness noted with resisted gait. Cue to prevent hip ER with lateral step ups. Decrease activity tolerance due to other comorbidity. All interventions completed well.  Pt will benefit from skilled PT to help increase strength in both LEs and decrease pain in the knee to improve QOL and gait pattern as well as ease with ADLs.  OBJECTIVE IMPAIRMENTS: Abnormal gait, cardiopulmonary status limiting activity, decreased balance, decreased coordination, decreased endurance, difficulty walking, decreased ROM, decreased strength, and pain.   ACTIVITY LIMITATIONS: carrying, lifting, bending, sitting, standing, squatting, stairs, transfers, bed mobility, and continence  PARTICIPATION LIMITATIONS: cleaning, laundry, and community activity  PERSONAL FACTORS: Age, Fitness, and Time since onset of injury/illness/exacerbation are also affecting patient's functional outcome.   REHAB POTENTIAL: Good  CLINICAL DECISION MAKING: Stable/uncomplicated  EVALUATION COMPLEXITY: Low   GOALS: Goals reviewed with patient? Yes  SHORT TERM GOALS: Target date: 04/15/23 Patient will be independent with initial home program.  Baseline: Goal status: Met 03/24/23  2.  Pt will improve 5x STS to 25 seconds. Baseline: 33s Goal status: INITIAL  LONG TERM GOALS: Target date: 05/20/23  Pt will be independent with long term HEP for carryover and improvement of ADL difficulty. Baseline:  Goal status: INITIAL  2.  Pt will improve 5x STS to 20 seconds. Baseline: 33 sec (03/11/23) Goal status: INITIAL  3.  Pt will improve TUG to 17 sec Baseline: 23 sec (03/11/23) Goal status: INITIAL  4. Pt will improve knee strength to 4/5 or better Baseline: 3+/5 with pain Goal status: INITIAL    PLAN:  PT FREQUENCY: 2x/week  PT DURATION: 10 weeks  PLANNED INTERVENTIONS: 97110-Therapeutic exercises, 97530- Therapeutic activity, 97112- Neuromuscular re-education, 97535- Self Care, and 91478- Manual  therapy  PLAN FOR NEXT SESSION: decrease pain, strengthen quads and hamstrings, gait training   Grayce Sessions, PTA 03/24/2023, 1:06 PM

## 2023-03-26 ENCOUNTER — Ambulatory Visit: Payer: Medicare Other | Admitting: Physical Therapy

## 2023-03-26 ENCOUNTER — Encounter: Payer: Self-pay | Admitting: Physical Therapy

## 2023-03-26 DIAGNOSIS — R262 Difficulty in walking, not elsewhere classified: Secondary | ICD-10-CM

## 2023-03-26 DIAGNOSIS — G8929 Other chronic pain: Secondary | ICD-10-CM

## 2023-03-26 DIAGNOSIS — M25562 Pain in left knee: Secondary | ICD-10-CM | POA: Diagnosis not present

## 2023-03-26 DIAGNOSIS — M6281 Muscle weakness (generalized): Secondary | ICD-10-CM

## 2023-03-26 NOTE — Therapy (Signed)
OUTPATIENT PHYSICAL THERAPY LOWER EXTREMITY TREATMENT   Patient Name: Brooke Jimenez MRN: [id number] DOB:Nov 06, 1945, 78 y.o., female Today's Date: 03/26/2023  END OF SESSION:  PT End of Session - 03/26/23 1600     Visit Number 5    Date for PT Re-Evaluation 05/20/23    PT Start Time 1600    PT Stop Time 1645    PT Time Calculation (min) 45 min    Activity Tolerance Patient tolerated treatment well    Behavior During Therapy WFL for tasks assessed/performed             Past Medical History:  Diagnosis Date   Acne rosacea    Anxiety    Arthritis    arthritis in hip    Bronchiectasis (HCC)    Cataract    beginning   Complication of anesthesia    "really sore throat"   Corneal dystrophy    DDD (degenerative disc disease), cervical    and lower back   Diverticulosis    Dysphagia    chronic- please see ultrasound done 4/16 15 in [facility]    Dysrhythmia    sometimes patient has extra beats per patient    Esophageal dysmotility    Family history of adverse reaction to anesthesia    mother slow to wake up    GERD (gastroesophageal reflux disease)    under control   Glaucoma    "suspect"   Gout    one finger x1 attack   H/O hiatal hernia    Hearing loss    bilateral due to nerve loss. wears hearing aides   Heart murmur    no problem    History of chicken pox    History of melanoma    melanoma- 1986 and 1997    Hypothyroidism    Low back pain with sciatica    Measles    hx of   Mitral valve prolapse    Nocturia    Osteoporosis    Peripheral neuropathy    feet   PONV (postoperative nausea and vomiting)    PTSD (post-traumatic stress disorder) 2006   RSD (reflex sympathetic dystrophy) 1993   Spider veins    Urinary incontinence    occasional   Varicose veins    Past Surgical History:  Procedure Laterality Date   ESOPHAGEAL MANOMETRY N/A 12/17/2015   Procedure: ESOPHAGEAL MANOMETRY (EM);  Surgeon: Napoleon Form, MD;  Location: [facility];   Service: Endoscopy;  Laterality: N/A;   HARDWARE REMOVAL Left 10/17/2013   Procedure: HARDWARE REMOVAL LEFT HIP;  Surgeon: Loanne Drilling, MD;  Location: [facility];  Service: Orthopedics;  Laterality: Left;   HIP FRACTURE SURGERY Left 2006   3 screws   INSERTION OF MESH  02/08/2014   Procedure: INSERTION OF MESH;  Surgeon: Avel Peace, MD;  Location: [facility];  Service: General;;   melanoma surgery   1986, 1997   PARATHYROIDECTOMY  04/02/15   PH IMPEDANCE STUDY N/A 12/17/2015   Procedure: PH IMPEDANCE STUDY;  Surgeon: Napoleon Form, MD;  Location: [facility];  Service: Endoscopy;  Laterality: N/A;   TOTAL HIP ARTHROPLASTY Left 04/10/2014   Procedure: LEFT TOTAL HIP ARTHROPLASTY ANTERIOR APPROACH;  Surgeon: Loanne Drilling, MD;  Location: [facility];  Service: Orthopedics;  Laterality: Left;   UMBILICAL HERNIA REPAIR N/A 02/08/2014   Procedure: HERNIA REPAIR UMBILICAL ADULT WITH MESH;  Surgeon: Avel Peace, MD;  Location: [facility];  Service: General;  Laterality: N/A;   Patient Active Problem List  Diagnosis Date Noted   Reactive monocytosis 11/29/2021   Rosacea 11/29/2021   Upper respiratory infection 01/15/2018   Postherpetic neuralgia 12/25/2016   Dysesthesia 12/25/2016   Polyneuropathy 12/25/2016   Exposure to influenza 04/01/2016   Dysphagia    Bronchiectasis without acute exacerbation (HCC) 08/30/2015   Bronchiectasis (HCC) 08/14/2015   Chronic rhinitis 08/14/2015   Avascular necrosis of femur head, left (HCC) 04/10/2014   OA (osteoarthritis) of hip 04/10/2014   Hypothyroidism 12/09/2013   Painful orthopaedic hardware (HCC) 10/17/2013    PCP: Dr. Ninetta Lights  REFERRING PROVIDER: Dr. Ollen Gross  REFERRING DIAG: Right knee pain with pes bursitis and chondromalacia patella  THERAPY DIAG:  Chronic pain of left knee  Muscle weakness (generalized)  Difficulty in walking, not elsewhere classified  Rationale for Evaluation and Treatment: Rehabilitation  ONSET DATE:  April 2024  SUBJECTIVE:   SUBJECTIVE STATEMENT: "Im here" "My knee isn't hurting at all, Im just tired"  PERTINENT HISTORY: Hip osteoarthritis Avascular necrosis of left femoral head DDD Dysrhythmia Gout Hearing loss LBP with Sciatica Osteoporosis Peripheral neuropathy (feet) Reflex Sympathetic Dystrophy PAIN:  Are you having pain? Yes: NPRS scale: 0/10 Pain location: lower back Pain description: sharp, dull, and achy Aggravating factors: bent for long time, steps, walking for a long period of time, standing for long periods, general movement Relieving factors: medication  PRECAUTIONS: Fall  RED FLAGS: None   WEIGHT BEARING RESTRICTIONS: No  FALLS:  Has patient fallen in last 6 months? Yes. Number of falls 1  LIVING ENVIRONMENT: Lives with: lives with their family and lives with their spouse Lives in: House/apartment Stairs: Yes: Internal: 1 flight steps; on right going up and External: 3 steps; on right going up Has following equipment at home: Single point cane, Walker - 2 wheeled, and Environmental consultant - 4 wheeled  OCCUPATION: retired  PLOF: Independent and Independent with basic ADLs  PATIENT GOALS: walk normally, gain confidence with gait  NEXT MD VISIT: none set   OBJECTIVE:  Note: Objective measures were completed at Evaluation unless otherwise noted.  DIAGNOSTIC FINDINGS: cartilage loss ine right knee  PATIENT SURVEYS:  FOTO 43  COGNITION: Overall cognitive status: Within functional limits for tasks assessed      POSTURE: rounded shoulders  PALPATION: Medial and Lateral right knee TTP  LOWER EXTREMITY ROM:  Active ROM Right eval Left eval  Hip flexion [facility]  Hip extension    Hip abduction    Hip adduction    Hip internal rotation    Hip external rotation    Knee flexion WFL* WFL  Knee extension Lack 25* WFL  Ankle dorsiflexion [facility]  Ankle plantarflexion [facility] WFL  Ankle inversion    Ankle eversion     (Blank rows = not tested, * =  pain)  LOWER EXTREMITY MMT:  MMT Right eval Left eval  Hip flexion 4/5 4/5  Hip extension    Hip abduction 4/5 4/5  Hip adduction 4/5 4/5  Hip internal rotation    Hip external rotation    Knee flexion 3+/5 4-/5  Knee extension 3+/5* 4-/5  Ankle dorsiflexion    Ankle plantarflexion    Ankle inversion    Ankle eversion     (Blank rows = not tested, * = pain)   FUNCTIONAL TESTS:  5 times sit to stand: 33 seconds Timed up and go (TUG): 23 seconds  TREATMENT DATE:  03/26/23 NuStep L 5 x 6 min GOALS S2S holding 3lb dumbbell 4in step ups 2x5 Heel raises 2x10 HS Curls 20lb 2x10  Leg Ext 5lb 2x10, 10lb x10 Rows green 2x15 Shoulder Ext green 2x15 03/24/23 NuStep L 4 x 6 min Resisted gait forward backward 20lb x3 4in lateral step ups x10 HHAx1 S2S 2x10 HS Curls 20lb 2x10  Leg Ext 5lb 2x10 MT  STM to R kne and surrounding streutures  R knee patellar mobs  R knee PROM  03/19/23 NuStep L 4 x 6 min MT  STM to R kne and surrounding streutures  R knee patellar mobs  R knee PROM LAQ RLE 2lb 2x10 HS curls RLE  yellow 2x10 S2S 2x5 each 4in step ups 2x5 each Slant board calf stretch   03/17/23 NuStep L 4 x 6 min MT  STM to R kne and surrounding streutures  R knee patellar mobs  R knee PROM LAQ RLE 2lb x10, x5 HS curls RLE  yellow x10, x5  Hip add blue ball 2x10 Hip abd red bald 2x10  03/11/23: Eval    PATIENT EDUCATION:  Education details: POC and HEP Person educated: Patient Education method: Explanation Education comprehension: verbalized understanding  HOME EXERCISE PROGRAM: Standing Knee flexion at the counter, 2x10 Standing Marches at the counter, 2x10 Seated LAQ, 2x10  ASSESSMENT:  CLINICAL IMPRESSION: Patient is a 78 y.o. woman who was seen today for physical therapy and treatment for right knee pain with pes bursitis and  chondromalacia patella. She has generalized weakness in both LEs. She has progressed towards her functional goals. CGA required with step ups during the first set. All interventions completed well.  Pt will benefit from skilled PT to help increase strength in both LEs and decrease pain in the knee to improve QOL and gait pattern as well as ease with ADLs.  OBJECTIVE IMPAIRMENTS: Abnormal gait, cardiopulmonary status limiting activity, decreased balance, decreased coordination, decreased endurance, difficulty walking, decreased ROM, decreased strength, and pain.   ACTIVITY LIMITATIONS: carrying, lifting, bending, sitting, standing, squatting, stairs, transfers, bed mobility, and continence  PARTICIPATION LIMITATIONS: cleaning, laundry, and community activity  PERSONAL FACTORS: Age, Fitness, and Time since onset of injury/illness/exacerbation are also affecting patient's functional outcome.   REHAB POTENTIAL: Good  CLINICAL DECISION MAKING: Stable/uncomplicated  EVALUATION COMPLEXITY: Low   GOALS: Goals reviewed with patient? Yes  SHORT TERM GOALS: Target date: 04/15/23 Patient will be independent with initial home program.  Baseline: Goal status: Met 03/24/23  2.  Pt will improve 5x STS to 25 seconds. Baseline: 33s Goal status: Met 17.3 sec 03/27/23  LONG TERM GOALS: Target date: 05/20/23  Pt will be independent with long term HEP for carryover and improvement of ADL difficulty. Baseline:  Goal status: INITIAL  2.  Pt will improve 5x STS to 20 seconds. Baseline: 33 sec (03/11/23) Goal status: Ongoing 03/26/23  3.  Pt will improve TUG to 17 sec Baseline: 23 sec (03/11/23) Goal status: Met 15.62 03/26/23  4. Pt will improve knee strength to 4/5 or better Baseline: 3+/5 with pain Goal status: INITIAL    PLAN:  PT FREQUENCY: 2x/week  PT DURATION: 10 weeks  PLANNED INTERVENTIONS: 97110-Therapeutic exercises, 97530- Therapeutic activity, 97112- Neuromuscular re-education,  97535- Self Care, and 98119- Manual therapy  PLAN FOR NEXT SESSION: decrease pain, strengthen quads and hamstrings, gait training   Grayce Sessions, PTA 03/26/2023, 4:01 PM

## 2023-03-31 ENCOUNTER — Ambulatory Visit: Payer: Medicare Other

## 2023-04-02 ENCOUNTER — Ambulatory Visit: Payer: Medicare Other | Attending: Orthopedic Surgery

## 2023-04-02 DIAGNOSIS — G8929 Other chronic pain: Secondary | ICD-10-CM | POA: Insufficient documentation

## 2023-04-02 DIAGNOSIS — M25562 Pain in left knee: Secondary | ICD-10-CM | POA: Insufficient documentation

## 2023-04-02 DIAGNOSIS — M545 Low back pain, unspecified: Secondary | ICD-10-CM | POA: Diagnosis present

## 2023-04-02 DIAGNOSIS — R600 Localized edema: Secondary | ICD-10-CM | POA: Insufficient documentation

## 2023-04-02 DIAGNOSIS — M6281 Muscle weakness (generalized): Secondary | ICD-10-CM | POA: Diagnosis present

## 2023-04-02 DIAGNOSIS — R262 Difficulty in walking, not elsewhere classified: Secondary | ICD-10-CM | POA: Insufficient documentation

## 2023-04-02 NOTE — Therapy (Signed)
 OUTPATIENT PHYSICAL THERAPY LOWER EXTREMITY TREATMENT   Patient Name: Brooke Jimenez MRN: [id number] DOB:July 26, 1945, 78 y.o., female Today's Date: 04/02/2023  END OF SESSION:  PT End of Session - 04/02/23 1509     Visit Number 6    Date for PT Re-Evaluation 05/20/23    PT Start Time 1506    PT Stop Time 1545    PT Time Calculation (min) 39 min    Activity Tolerance Patient tolerated treatment well    Behavior During Therapy WFL for tasks assessed/performed              Past Medical History:  Diagnosis Date   Acne rosacea    Anxiety    Arthritis    arthritis in hip    Bronchiectasis (HCC)    Cataract    beginning   Complication of anesthesia    really sore throat   Corneal dystrophy    DDD (degenerative disc disease), cervical    and lower back   Diverticulosis    Dysphagia    chronic- please see ultrasound done 4/16 15 in [facility]    Dysrhythmia    sometimes patient has extra beats per patient    Esophageal dysmotility    Family history of adverse reaction to anesthesia    mother slow to wake up    GERD (gastroesophageal reflux disease)    under control   Glaucoma    suspect   Gout    one finger x1 attack   H/O hiatal hernia    Hearing loss    bilateral due to nerve loss. wears hearing aides   Heart murmur    no problem    History of chicken pox    History of melanoma    melanoma- 1986 and 1997    Hypothyroidism    Low back pain with sciatica    Measles    hx of   Mitral valve prolapse    Nocturia    Osteoporosis    Peripheral neuropathy    feet   PONV (postoperative nausea and vomiting)    PTSD (post-traumatic stress disorder) 2006   RSD (reflex sympathetic dystrophy) 1993   Spider veins    Urinary incontinence    occasional   Varicose veins    Past Surgical History:  Procedure Laterality Date   ESOPHAGEAL MANOMETRY N/A 12/17/2015   Procedure: ESOPHAGEAL MANOMETRY (EM);  Surgeon: Gustav LULLA Mcgee, MD;  Location: [facility];   Service: Endoscopy;  Laterality: N/A;   HARDWARE REMOVAL Left 10/17/2013   Procedure: HARDWARE REMOVAL LEFT HIP;  Surgeon: Dempsey Melodi LULLA, MD;  Location: [facility];  Service: Orthopedics;  Laterality: Left;   HIP FRACTURE SURGERY Left 2006   3 screws   INSERTION OF MESH  02/08/2014   Procedure: INSERTION OF MESH;  Surgeon: Krystal Russell, MD;  Location: [facility];  Service: General;;   melanoma surgery   1986, 1997   PARATHYROIDECTOMY  04/02/15   PH IMPEDANCE STUDY N/A 12/17/2015   Procedure: PH IMPEDANCE STUDY;  Surgeon: Gustav LULLA Mcgee, MD;  Location: [facility];  Service: Endoscopy;  Laterality: N/A;   TOTAL HIP ARTHROPLASTY Left 04/10/2014   Procedure: LEFT TOTAL HIP ARTHROPLASTY ANTERIOR APPROACH;  Surgeon: Dempsey Melodi LULLA, MD;  Location: [facility];  Service: Orthopedics;  Laterality: Left;   UMBILICAL HERNIA REPAIR N/A 02/08/2014   Procedure: HERNIA REPAIR UMBILICAL ADULT WITH MESH;  Surgeon: Krystal Russell, MD;  Location: [facility];  Service: General;  Laterality: N/A;   Patient Active Problem  List   Diagnosis Date Noted   Reactive monocytosis 11/29/2021   Rosacea 11/29/2021   Upper respiratory infection 01/15/2018   Postherpetic neuralgia 12/25/2016   Dysesthesia 12/25/2016   Polyneuropathy 12/25/2016   Exposure to influenza 04/01/2016   Dysphagia    Bronchiectasis without acute exacerbation (HCC) 08/30/2015   Bronchiectasis (HCC) 08/14/2015   Chronic rhinitis 08/14/2015   Avascular necrosis of femur head, left (HCC) 04/10/2014   OA (osteoarthritis) of hip 04/10/2014   Hypothyroidism 12/09/2013   Painful orthopaedic hardware (HCC) 10/17/2013    PCP: Dr. Camie Cools  REFERRING PROVIDER: Dr. Dempsey Moan  REFERRING DIAG: Right knee pain with pes bursitis and chondromalacia patella  THERAPY DIAG:  Chronic pain of left knee  Muscle weakness (generalized)  Difficulty in walking, not elsewhere classified  Chronic bilateral low back pain without sciatica  Rationale for  Evaluation and Treatment: Rehabilitation  ONSET DATE: April 2024  SUBJECTIVE:   SUBJECTIVE STATEMENT: I think I am improving and improving every visit. Very little pain it comes and goes.   PERTINENT HISTORY: Hip osteoarthritis Avascular necrosis of left femoral head DDD Dysrhythmia Gout Hearing loss LBP with Sciatica Osteoporosis Peripheral neuropathy (feet) Reflex Sympathetic Dystrophy PAIN:  Are you having pain? Yes: NPRS scale: 0/10 Pain location: lower back Pain description: sharp, dull, and achy Aggravating factors: bent for long time, steps, walking for a long period of time, standing for long periods, general movement Relieving factors: medication  PRECAUTIONS: Fall  RED FLAGS: None   WEIGHT BEARING RESTRICTIONS: No  FALLS:  Has patient fallen in last 6 months? Yes. Number of falls 1  LIVING ENVIRONMENT: Lives with: lives with their family and lives with their spouse Lives in: House/apartment Stairs: Yes: Internal: 1 flight steps; on right going up and External: 3 steps; on right going up Has following equipment at home: Single point cane, Walker - 2 wheeled, and Environmental Consultant - 4 wheeled  OCCUPATION: retired  PLOF: Independent and Independent with basic ADLs  PATIENT GOALS: walk normally, gain confidence with gait  NEXT MD VISIT: none set   OBJECTIVE:  Note: Objective measures were completed at Evaluation unless otherwise noted.  DIAGNOSTIC FINDINGS: cartilage loss ine right knee  PATIENT SURVEYS:  FOTO 43  COGNITION: Overall cognitive status: Within functional limits for tasks assessed      POSTURE: rounded shoulders  PALPATION: Medial and Lateral right knee TTP  LOWER EXTREMITY ROM:  Active ROM Right eval Left eval  Hip flexion [facility]  Hip extension    Hip abduction    Hip adduction    Hip internal rotation    Hip external rotation    Knee flexion WFL* WFL  Knee extension Lack 25* WFL  Ankle dorsiflexion [facility]  Ankle  plantarflexion [facility] WFL  Ankle inversion    Ankle eversion     (Blank rows = not tested, * = pain)  LOWER EXTREMITY MMT:  MMT Right eval Left eval  Hip flexion 4/5 4/5  Hip extension    Hip abduction 4/5 4/5  Hip adduction 4/5 4/5  Hip internal rotation    Hip external rotation    Knee flexion 3+/5 4-/5  Knee extension 3+/5* 4-/5  Ankle dorsiflexion    Ankle plantarflexion    Ankle inversion    Ankle eversion     (Blank rows = not tested, * = pain)   FUNCTIONAL TESTS:  5 times sit to stand: 33 seconds Timed up and go (TUG): 23 seconds  TREATMENT DATE:  04/02/23 NuStep L5x90mins  Calf raises 2x10  Calf stretch 30s  HS Curls 20lb 2x10  Leg Ext 10lb 2x10 Walking on beam  4 in step ups    03/26/23 NuStep L 5 x 6 min GOALS S2S holding 3lb dumbbell 4in step ups 2x5 Heel raises 2x10 HS Curls 20lb 2x10  Leg Ext 5lb 2x10, 10lb x10 Rows green 2x15 Shoulder Ext green 2x15  03/24/23 NuStep L 4 x 6 min Resisted gait forward backward 20lb x3 4in lateral step ups x10 HHAx1 S2S 2x10 HS Curls 20lb 2x10  Leg Ext 5lb 2x10 MT  STM to R kne and surrounding streutures  R knee patellar mobs  R knee PROM  03/19/23 NuStep L 4 x 6 min MT  STM to R kne and surrounding streutures  R knee patellar mobs  R knee PROM LAQ RLE 2lb 2x10 HS curls RLE  yellow 2x10 S2S 2x5 each 4in step ups 2x5 each Slant board calf stretch   03/17/23 NuStep L 4 x 6 min MT  STM to R kne and surrounding streutures  R knee patellar mobs  R knee PROM LAQ RLE 2lb x10, x5 HS curls RLE  yellow x10, x5  Hip add blue ball 2x10 Hip abd red bald 2x10  03/11/23: Eval    PATIENT EDUCATION:  Education details: POC and HEP Person educated: Patient Education method: Explanation Education comprehension: verbalized understanding  HOME EXERCISE PROGRAM: Standing Knee flexion at  the counter, 2x10 Standing Marches at the counter, 2x10 Seated LAQ, 2x10  ASSESSMENT:  CLINICAL IMPRESSION: Patient is a 78 y.o. woman who was seen today for physical therapy treatment for right knee pain with pes bursitis and chondromalacia patella. She reports no pain today and she is feeling better overall. She has some pain at pes anserine insertion with stepping and turning when walking on beam. Unable to do step ups without holding on to rails. Pt will benefit from skilled PT to help increase strength in both LEs and decrease pain in the knee to improve QOL and gait pattern as well as ease with ADLs.   OBJECTIVE IMPAIRMENTS: Abnormal gait, cardiopulmonary status limiting activity, decreased balance, decreased coordination, decreased endurance, difficulty walking, decreased ROM, decreased strength, and pain.   ACTIVITY LIMITATIONS: carrying, lifting, bending, sitting, standing, squatting, stairs, transfers, bed mobility, and continence  PARTICIPATION LIMITATIONS: cleaning, laundry, and community activity  PERSONAL FACTORS: Age, Fitness, and Time since onset of injury/illness/exacerbation are also affecting patient's functional outcome.   REHAB POTENTIAL: Good  CLINICAL DECISION MAKING: Stable/uncomplicated  EVALUATION COMPLEXITY: Low   GOALS: Goals reviewed with patient? Yes  SHORT TERM GOALS: Target date: 04/15/23 Patient will be independent with initial home program.  Baseline: Goal status: Met 03/24/23  2.  Pt will improve 5x STS to 25 seconds. Baseline: 33s Goal status: Met 17.3 sec 03/27/23  LONG TERM GOALS: Target date: 05/20/23  Pt will be independent with long term HEP for carryover and improvement of ADL difficulty. Baseline:  Goal status: INITIAL  2.  Pt will improve 5x STS to 20 seconds. Baseline: 33 sec (03/11/23) Goal status: Ongoing 03/26/23  3.  Pt will improve TUG to 17 sec Baseline: 23 sec (03/11/23) Goal status: Met 15.62 03/26/23  4. Pt will improve  knee strength to 4/5 or better Baseline: 3+/5 with pain Goal status: INITIAL    PLAN:  PT FREQUENCY: 2x/week  PT DURATION: 10 weeks  PLANNED INTERVENTIONS: 97110-Therapeutic exercises, 97530- Therapeutic activity, [id number]- Neuromuscular re-education, 97535- Self Care,  and [id number]- Manual therapy  PLAN FOR NEXT SESSION: decrease pain, strengthen quads and hamstrings, gait training   Almetta Fam, PT 04/02/2023, 3:46 PM

## 2023-04-07 ENCOUNTER — Encounter: Payer: Self-pay | Admitting: Physical Therapy

## 2023-04-07 ENCOUNTER — Ambulatory Visit: Payer: Medicare Other | Admitting: Physical Therapy

## 2023-04-07 DIAGNOSIS — M25562 Pain in left knee: Secondary | ICD-10-CM | POA: Diagnosis not present

## 2023-04-07 DIAGNOSIS — M6281 Muscle weakness (generalized): Secondary | ICD-10-CM

## 2023-04-07 DIAGNOSIS — R262 Difficulty in walking, not elsewhere classified: Secondary | ICD-10-CM

## 2023-04-07 DIAGNOSIS — G8929 Other chronic pain: Secondary | ICD-10-CM

## 2023-04-07 NOTE — Therapy (Signed)
OUTPATIENT PHYSICAL THERAPY LOWER EXTREMITY TREATMENT   Patient Name: Brooke Jimenez MRN: [id number] DOB:1946/01/20, 78 y.o., female Today's Date: 04/07/2023  END OF SESSION:  PT End of Session - 04/07/23 1558     Visit Number 7    Date for PT Re-Evaluation 05/20/23    PT Start Time 1600    PT Stop Time 1645    PT Time Calculation (min) 45 min    Activity Tolerance Patient tolerated treatment well    Behavior During Therapy WFL for tasks assessed/performed              Past Medical History:  Diagnosis Date   Acne rosacea    Anxiety    Arthritis    arthritis in hip    Bronchiectasis (HCC)    Cataract    beginning   Complication of anesthesia    "really sore throat"   Corneal dystrophy    DDD (degenerative disc disease), cervical    and lower back   Diverticulosis    Dysphagia    chronic- please see ultrasound done 4/16 15 in [facility]    Dysrhythmia    sometimes patient has extra beats per patient    Esophageal dysmotility    Family history of adverse reaction to anesthesia    mother slow to wake up    GERD (gastroesophageal reflux disease)    under control   Glaucoma    "suspect"   Gout    one finger x1 attack   H/O hiatal hernia    Hearing loss    bilateral due to nerve loss. wears hearing aides   Heart murmur    no problem    History of chicken pox    History of melanoma    melanoma- 1986 and 1997    Hypothyroidism    Low back pain with sciatica    Measles    hx of   Mitral valve prolapse    Nocturia    Osteoporosis    Peripheral neuropathy    feet   PONV (postoperative nausea and vomiting)    PTSD (post-traumatic stress disorder) 2006   RSD (reflex sympathetic dystrophy) 1993   Spider veins    Urinary incontinence    occasional   Varicose veins    Past Surgical History:  Procedure Laterality Date   ESOPHAGEAL MANOMETRY N/A 12/17/2015   Procedure: ESOPHAGEAL MANOMETRY (EM);  Surgeon: Napoleon Form, MD;  Location: [facility];   Service: Endoscopy;  Laterality: N/A;   HARDWARE REMOVAL Left 10/17/2013   Procedure: HARDWARE REMOVAL LEFT HIP;  Surgeon: Loanne Drilling, MD;  Location: [facility];  Service: Orthopedics;  Laterality: Left;   HIP FRACTURE SURGERY Left 2006   3 screws   INSERTION OF MESH  02/08/2014   Procedure: INSERTION OF MESH;  Surgeon: Avel Peace, MD;  Location: [facility];  Service: General;;   melanoma surgery   1986, 1997   PARATHYROIDECTOMY  04/02/15   PH IMPEDANCE STUDY N/A 12/17/2015   Procedure: PH IMPEDANCE STUDY;  Surgeon: Napoleon Form, MD;  Location: [facility];  Service: Endoscopy;  Laterality: N/A;   TOTAL HIP ARTHROPLASTY Left 04/10/2014   Procedure: LEFT TOTAL HIP ARTHROPLASTY ANTERIOR APPROACH;  Surgeon: Loanne Drilling, MD;  Location: [facility];  Service: Orthopedics;  Laterality: Left;   UMBILICAL HERNIA REPAIR N/A 02/08/2014   Procedure: HERNIA REPAIR UMBILICAL ADULT WITH MESH;  Surgeon: Avel Peace, MD;  Location: [facility];  Service: General;  Laterality: N/A;   Patient Active Problem  List   Diagnosis Date Noted   Reactive monocytosis 11/29/2021   Rosacea 11/29/2021   Upper respiratory infection 01/15/2018   Postherpetic neuralgia 12/25/2016   Dysesthesia 12/25/2016   Polyneuropathy 12/25/2016   Exposure to influenza 04/01/2016   Dysphagia    Bronchiectasis without acute exacerbation (HCC) 08/30/2015   Bronchiectasis (HCC) 08/14/2015   Chronic rhinitis 08/14/2015   Avascular necrosis of femur head, left (HCC) 04/10/2014   OA (osteoarthritis) of hip 04/10/2014   Hypothyroidism 12/09/2013   Painful orthopaedic hardware (HCC) 10/17/2013    PCP: Dr. Ninetta Lights  REFERRING PROVIDER: Dr. Ollen Gross  REFERRING DIAG: Right knee pain with pes bursitis and chondromalacia patella  THERAPY DIAG:  Chronic pain of left knee  Difficulty in walking, not elsewhere classified  Muscle weakness (generalized)  Rationale for Evaluation and Treatment: Rehabilitation  ONSET DATE:  April 2024  SUBJECTIVE:   SUBJECTIVE STATEMENT: "Im good"  PERTINENT HISTORY: Hip osteoarthritis Avascular necrosis of left femoral head DDD Dysrhythmia Gout Hearing loss LBP with Sciatica Osteoporosis Peripheral neuropathy (feet) Reflex Sympathetic Dystrophy PAIN:  Are you having pain? Yes: NPRS scale: 4-5/10 "Rain makes the difference" Pain location: lower back Pain description: sharp, dull, and achy Aggravating factors: bent for long time, steps, walking for a long period of time, standing for long periods, general movement Relieving factors: medication  PRECAUTIONS: Fall  RED FLAGS: None   WEIGHT BEARING RESTRICTIONS: No  FALLS:  Has patient fallen in last 6 months? Yes. Number of falls 1  LIVING ENVIRONMENT: Lives with: lives with their family and lives with their spouse Lives in: House/apartment Stairs: Yes: Internal: 1 flight steps; on right going up and External: 3 steps; on right going up Has following equipment at home: Single point cane, Walker - 2 wheeled, and Environmental consultant - 4 wheeled  OCCUPATION: retired  PLOF: Independent and Independent with basic ADLs  PATIENT GOALS: walk normally, gain confidence with gait  NEXT MD VISIT: none set   OBJECTIVE:  Note: Objective measures were completed at Evaluation unless otherwise noted.  DIAGNOSTIC FINDINGS: cartilage loss ine right knee  PATIENT SURVEYS:  FOTO 43  COGNITION: Overall cognitive status: Within functional limits for tasks assessed      POSTURE: rounded shoulders  PALPATION: Medial and Lateral right knee TTP  LOWER EXTREMITY ROM:  Active ROM Right eval Left eval  Hip flexion [facility]  Hip extension    Hip abduction    Hip adduction    Hip internal rotation    Hip external rotation    Knee flexion WFL* WFL  Knee extension Lack 25* WFL  Ankle dorsiflexion [facility]  Ankle plantarflexion [facility] WFL  Ankle inversion    Ankle eversion     (Blank rows = not tested, * = pain)  LOWER  EXTREMITY MMT:  MMT Right eval Left eval  Hip flexion 4/5 4/5  Hip extension    Hip abduction 4/5 4/5  Hip adduction 4/5 4/5  Hip internal rotation    Hip external rotation    Knee flexion 3+/5 4-/5  Knee extension 3+/5* 4-/5  Ankle dorsiflexion    Ankle plantarflexion    Ankle inversion    Ankle eversion     (Blank rows = not tested, * = pain)   FUNCTIONAL TESTS:  5 times sit to stand: 33 seconds Timed up and go (TUG): 23 seconds  TREATMENT DATE:  04/07/23 NuStep L 5 x 6 min HS Curls 20lb 2x12  Leg Ext 10lb 2x12 6in step ups from airex w/ HHA x2  S2S x5, S2S on airex 2x5  Hip abd green 2x15 Lateral Step ups 4in HHA x1  04/02/23 NuStep L5x60mins  Calf raises 2x10  Calf stretch 30s  HS Curls 20lb 2x10  Leg Ext 10lb 2x10 Walking on beam  4 in step ups    03/26/23 NuStep L 5 x 6 min GOALS S2S holding 3lb dumbbell 4in step ups 2x5 Heel raises 2x10 HS Curls 20lb 2x10  Leg Ext 5lb 2x10, 10lb x10 Rows green 2x15 Shoulder Ext green 2x15  03/24/23 NuStep L 4 x 6 min Resisted gait forward backward 20lb x3 4in lateral step ups x10 HHAx1 S2S 2x10 HS Curls 20lb 2x10  Leg Ext 5lb 2x10 MT  STM to R kne and surrounding streutures  R knee patellar mobs  R knee PROM  03/19/23 NuStep L 4 x 6 min MT  STM to R kne and surrounding streutures  R knee patellar mobs  R knee PROM LAQ RLE 2lb 2x10 HS curls RLE  yellow 2x10 S2S 2x5 each 4in step ups 2x5 each Slant board calf stretch   03/17/23 NuStep L 4 x 6 min MT  STM to R kne and surrounding streutures  R knee patellar mobs  R knee PROM LAQ RLE 2lb x10, x5 HS curls RLE  yellow x10, x5  Hip add blue ball 2x10 Hip abd red bald 2x10  03/11/23: Eval    PATIENT EDUCATION:  Education details: POC and HEP Person educated: Patient Education method: Explanation Education comprehension:  verbalized understanding  HOME EXERCISE PROGRAM: Standing Knee flexion at the counter, 2x10 Standing Marches at the counter, 2x10 Seated LAQ, 2x10  ASSESSMENT:  CLINICAL IMPRESSION: Patient is a 78 y.o. woman who was seen today for physical therapy treatment for right knee pain with pes bursitis and chondromalacia patella. She enters with reports of com pain that she thinks it is due to rain and not being as active. Again unable to do step ups without holding on to rails. Increase reps tolerated with HS curls and leg extensions, pt states the last few reps were difficult. CGA needed times with sit toe stands on airex. Pt will benefit from skilled PT to help increase strength in both LEs and decrease pain in the knee to improve QOL and gait pattern as well as ease with ADLs.   OBJECTIVE IMPAIRMENTS: Abnormal gait, cardiopulmonary status limiting activity, decreased balance, decreased coordination, decreased endurance, difficulty walking, decreased ROM, decreased strength, and pain.   ACTIVITY LIMITATIONS: carrying, lifting, bending, sitting, standing, squatting, stairs, transfers, bed mobility, and continence  PARTICIPATION LIMITATIONS: cleaning, laundry, and community activity  PERSONAL FACTORS: Age, Fitness, and Time since onset of injury/illness/exacerbation are also affecting patient's functional outcome.   REHAB POTENTIAL: Good  CLINICAL DECISION MAKING: Stable/uncomplicated  EVALUATION COMPLEXITY: Low   GOALS: Goals reviewed with patient? Yes  SHORT TERM GOALS: Target date: 04/15/23 Patient will be independent with initial home program.  Baseline: Goal status: Met 03/24/23  2.  Pt will improve 5x STS to 25 seconds. Baseline: 33s Goal status: Met 17.3 sec 03/27/23  LONG TERM GOALS: Target date: 05/20/23  Pt will be independent with long term HEP for carryover and improvement of ADL difficulty. Baseline:  Goal status: INITIAL  2.  Pt will improve 5x STS to 20  seconds. Baseline: 33 sec (03/11/23) Goal status: Ongoing 03/26/23  3.  Pt will improve TUG to 17 sec Baseline: 23 sec (03/11/23) Goal status: Met 15.62 03/26/23  4. Pt will improve knee strength to 4/5 or better Baseline: 3+/5 with pain Goal status: INITIAL    PLAN:  PT FREQUENCY: 2x/week  PT DURATION: 10 weeks  PLANNED INTERVENTIONS: 97110-Therapeutic exercises, 97530- Therapeutic activity, 97112- Neuromuscular re-education, 97535- Self Care, and 14782- Manual therapy  PLAN FOR NEXT SESSION: decrease pain, strengthen quads and hamstrings, gait training   Grayce Sessions, PTA 04/07/2023, 3:59 PM

## 2023-04-09 ENCOUNTER — Ambulatory Visit: Payer: Medicare Other | Admitting: Physical Therapy

## 2023-04-09 ENCOUNTER — Encounter: Payer: Self-pay | Admitting: Physical Therapy

## 2023-04-09 DIAGNOSIS — M6281 Muscle weakness (generalized): Secondary | ICD-10-CM

## 2023-04-09 DIAGNOSIS — G8929 Other chronic pain: Secondary | ICD-10-CM

## 2023-04-09 DIAGNOSIS — M25562 Pain in left knee: Secondary | ICD-10-CM | POA: Diagnosis not present

## 2023-04-09 DIAGNOSIS — R262 Difficulty in walking, not elsewhere classified: Secondary | ICD-10-CM

## 2023-04-09 NOTE — Therapy (Signed)
OUTPATIENT PHYSICAL THERAPY LOWER EXTREMITY TREATMENT   Patient Name: Brooke Jimenez MRN: [id number] DOB:04-24-1945, 78 y.o., female Today's Date: 04/09/2023  END OF SESSION:  PT End of Session - 04/09/23 1604     Visit Number 8    Date for PT Re-Evaluation 05/20/23    PT Start Time 1604    PT Stop Time 1645    PT Time Calculation (min) 41 min    Activity Tolerance Patient tolerated treatment well    Behavior During Therapy WFL for tasks assessed/performed              Past Medical History:  Diagnosis Date   Acne rosacea    Anxiety    Arthritis    arthritis in hip    Bronchiectasis (HCC)    Cataract    beginning   Complication of anesthesia    "really sore throat"   Corneal dystrophy    DDD (degenerative disc disease), cervical    and lower back   Diverticulosis    Dysphagia    chronic- please see ultrasound done 4/16 15 in [facility]    Dysrhythmia    sometimes patient has extra beats per patient    Esophageal dysmotility    Family history of adverse reaction to anesthesia    mother slow to wake up    GERD (gastroesophageal reflux disease)    under control   Glaucoma    "suspect"   Gout    one finger x1 attack   H/O hiatal hernia    Hearing loss    bilateral due to nerve loss. wears hearing aides   Heart murmur    no problem    History of chicken pox    History of melanoma    melanoma- 1986 and 1997    Hypothyroidism    Low back pain with sciatica    Measles    hx of   Mitral valve prolapse    Nocturia    Osteoporosis    Peripheral neuropathy    feet   PONV (postoperative nausea and vomiting)    PTSD (post-traumatic stress disorder) 2006   RSD (reflex sympathetic dystrophy) 1993   Spider veins    Urinary incontinence    occasional   Varicose veins    Past Surgical History:  Procedure Laterality Date   ESOPHAGEAL MANOMETRY N/A 12/17/2015   Procedure: ESOPHAGEAL MANOMETRY (EM);  Surgeon: Napoleon Form, MD;  Location: [facility];   Service: Endoscopy;  Laterality: N/A;   HARDWARE REMOVAL Left 10/17/2013   Procedure: HARDWARE REMOVAL LEFT HIP;  Surgeon: Loanne Drilling, MD;  Location: [facility];  Service: Orthopedics;  Laterality: Left;   HIP FRACTURE SURGERY Left 2006   3 screws   INSERTION OF MESH  02/08/2014   Procedure: INSERTION OF MESH;  Surgeon: Avel Peace, MD;  Location: [facility];  Service: General;;   melanoma surgery   1986, 1997   PARATHYROIDECTOMY  04/02/15   PH IMPEDANCE STUDY N/A 12/17/2015   Procedure: PH IMPEDANCE STUDY;  Surgeon: Napoleon Form, MD;  Location: [facility];  Service: Endoscopy;  Laterality: N/A;   TOTAL HIP ARTHROPLASTY Left 04/10/2014   Procedure: LEFT TOTAL HIP ARTHROPLASTY ANTERIOR APPROACH;  Surgeon: Loanne Drilling, MD;  Location: [facility];  Service: Orthopedics;  Laterality: Left;   UMBILICAL HERNIA REPAIR N/A 02/08/2014   Procedure: HERNIA REPAIR UMBILICAL ADULT WITH MESH;  Surgeon: Avel Peace, MD;  Location: [facility];  Service: General;  Laterality: N/A;   Patient Active Problem  List   Diagnosis Date Noted   Reactive monocytosis 11/29/2021   Rosacea 11/29/2021   Upper respiratory infection 01/15/2018   Postherpetic neuralgia 12/25/2016   Dysesthesia 12/25/2016   Polyneuropathy 12/25/2016   Exposure to influenza 04/01/2016   Dysphagia    Bronchiectasis without acute exacerbation (HCC) 08/30/2015   Bronchiectasis (HCC) 08/14/2015   Chronic rhinitis 08/14/2015   Avascular necrosis of femur head, left (HCC) 04/10/2014   OA (osteoarthritis) of hip 04/10/2014   Hypothyroidism 12/09/2013   Painful orthopaedic hardware (HCC) 10/17/2013    PCP: Dr. Ninetta Lights  REFERRING PROVIDER: Dr. Ollen Gross  REFERRING DIAG: Right knee pain with pes bursitis and chondromalacia patella  THERAPY DIAG:  Chronic pain of left knee  Difficulty in walking, not elsewhere classified  Muscle weakness (generalized)  Chronic bilateral low back pain without sciatica  Rationale for  Evaluation and Treatment: Rehabilitation  ONSET DATE: April 2024  SUBJECTIVE:   SUBJECTIVE STATEMENT: "R knee has been sore"  PERTINENT HISTORY: Hip osteoarthritis Avascular necrosis of left femoral head DDD Dysrhythmia Gout Hearing loss LBP with Sciatica Osteoporosis Peripheral neuropathy (feet) Reflex Sympathetic Dystrophy PAIN:  Are you having pain? Yes: NPRS scale: 6-7/10 sporadic Pain location: lower back Pain description: sharp Aggravating factors: bent for long time, steps, walking for a long period of time, standing for long periods, general movement Relieving factors: medication  PRECAUTIONS: Fall  RED FLAGS: None   WEIGHT BEARING RESTRICTIONS: No  FALLS:  Has patient fallen in last 6 months? Yes. Number of falls 1  LIVING ENVIRONMENT: Lives with: lives with their family and lives with their spouse Lives in: House/apartment Stairs: Yes: Internal: 1 flight steps; on right going up and External: 3 steps; on right going up Has following equipment at home: Single point cane, Walker - 2 wheeled, and Environmental consultant - 4 wheeled  OCCUPATION: retired  PLOF: Independent and Independent with basic ADLs  PATIENT GOALS: walk normally, gain confidence with gait  NEXT MD VISIT: none set   OBJECTIVE:  Note: Objective measures were completed at Evaluation unless otherwise noted.  DIAGNOSTIC FINDINGS: cartilage loss ine right knee  PATIENT SURVEYS:  FOTO 43  COGNITION: Overall cognitive status: Within functional limits for tasks assessed      POSTURE: rounded shoulders  PALPATION: Medial and Lateral right knee TTP  LOWER EXTREMITY ROM:  Active ROM Right eval Left eval  Hip flexion [facility]  Hip extension    Hip abduction    Hip adduction    Hip internal rotation    Hip external rotation    Knee flexion WFL* WFL  Knee extension Lack 25* WFL  Ankle dorsiflexion [facility]  Ankle plantarflexion [facility] WFL  Ankle inversion    Ankle eversion     (Blank rows =  not tested, * = pain)  LOWER EXTREMITY MMT:  MMT Right eval Left eval  Hip flexion 4/5 4/5  Hip extension    Hip abduction 4/5 4/5  Hip adduction 4/5 4/5  Hip internal rotation    Hip external rotation    Knee flexion 3+/5 4-/5  Knee extension 3+/5* 4-/5  Ankle dorsiflexion    Ankle plantarflexion    Ankle inversion    Ankle eversion     (Blank rows = not tested, * = pain)   FUNCTIONAL TESTS:  5 times sit to stand: 33 seconds Timed up and go (TUG): 23 seconds  TREATMENT DATE:  04/09/23 NuStep L 5 x 8 min STM semi tendinosis and insertion pint muscles PROM to bilat LE Hamstring, piriformis, K2x RLE LAQ 2x10 RLE HS curls yellow 2x10 RLE hip flex Hip add ball squeeze  S2S x5   04/07/23 NuStep L 5 x 6 min HS Curls 20lb 2x12  Leg Ext 10lb 2x12 6in step ups from airex w/ HHA x2  S2S x5, S2S on airex 2x5  Hip abd green 2x15 Lateral Step ups 4in HHA x1  04/02/23 NuStep L5x46mins  Calf raises 2x10  Calf stretch 30s  HS Curls 20lb 2x10  Leg Ext 10lb 2x10 Walking on beam  4 in step ups    03/26/23 NuStep L 5 x 6 min GOALS S2S holding 3lb dumbbell 4in step ups 2x5 Heel raises 2x10 HS Curls 20lb 2x10  Leg Ext 5lb 2x10, 10lb x10 Rows green 2x15 Shoulder Ext green 2x15  03/24/23 NuStep L 4 x 6 min Resisted gait forward backward 20lb x3 4in lateral step ups x10 HHAx1 S2S 2x10 HS Curls 20lb 2x10  Leg Ext 5lb 2x10 MT  STM to R kne and surrounding streutures  R knee patellar mobs  R knee PROM  03/19/23 NuStep L 4 x 6 min MT  STM to R kne and surrounding streutures  R knee patellar mobs  R knee PROM LAQ RLE 2lb 2x10 HS curls RLE  yellow 2x10 S2S 2x5 each 4in step ups 2x5 each Slant board calf stretch   03/17/23 NuStep L 4 x 6 min MT  STM to R kne and surrounding streutures  R knee patellar mobs  R knee PROM LAQ RLE 2lb x10,  x5 HS curls RLE  yellow x10, x5  Hip add blue ball 2x10 Hip abd red bald 2x10  03/11/23: Eval    PATIENT EDUCATION:  Education details: POC and HEP Person educated: Patient Education method: Explanation Education comprehension: verbalized understanding  HOME EXERCISE PROGRAM: Standing Knee flexion at the counter, 2x10 Standing Marches at the counter, 2x10 Seated LAQ, 2x10  ASSESSMENT:  CLINICAL IMPRESSION: Patient is a 78 y.o. woman who was seen today for physical therapy treatment for right knee pain with pes bursitis and chondromalacia patella. She enters with reports of  R knee pain. She is very tender in the distal semi tendinosis with STM. Pt reports the pain goes down into her ankle. Session consisted of MT and light AROM intervention. Pt will benefit from skilled PT to help increase strength in both LEs and decrease pain in the knee to improve QOL and gait pattern as well as ease with ADLs.   OBJECTIVE IMPAIRMENTS: Abnormal gait, cardiopulmonary status limiting activity, decreased balance, decreased coordination, decreased endurance, difficulty walking, decreased ROM, decreased strength, and pain.   ACTIVITY LIMITATIONS: carrying, lifting, bending, sitting, standing, squatting, stairs, transfers, bed mobility, and continence  PARTICIPATION LIMITATIONS: cleaning, laundry, and community activity  PERSONAL FACTORS: Age, Fitness, and Time since onset of injury/illness/exacerbation are also affecting patient's functional outcome.   REHAB POTENTIAL: Good  CLINICAL DECISION MAKING: Stable/uncomplicated  EVALUATION COMPLEXITY: Low   GOALS: Goals reviewed with patient? Yes  SHORT TERM GOALS: Target date: 04/15/23 Patient will be independent with initial home program.  Baseline: Goal status: Met 03/24/23  2.  Pt will improve 5x STS to 25 seconds. Baseline: 33s Goal status: Met 17.3 sec 03/27/23  LONG TERM GOALS: Target date: 05/20/23  Pt will be independent with long  term HEP for carryover and improvement of ADL difficulty. Baseline:  Goal status:  INITIAL  2.  Pt will improve 5x STS to 20 seconds. Baseline: 33 sec (03/11/23) Goal status: Ongoing 03/26/23  3.  Pt will improve TUG to 17 sec Baseline: 23 sec (03/11/23) Goal status: Met 15.62 03/26/23  4. Pt will improve knee strength to 4/5 or better Baseline: 3+/5 with pain Goal status: INITIAL    PLAN:  PT FREQUENCY: 2x/week  PT DURATION: 10 weeks  PLANNED INTERVENTIONS: 97110-Therapeutic exercises, 97530- Therapeutic activity, 97112- Neuromuscular re-education, 97535- Self Care, and 86578- Manual therapy  PLAN FOR NEXT SESSION: decrease pain, strengthen quads and hamstrings, gait training   Grayce Sessions, PTA 04/09/2023, 4:06 PM

## 2023-04-14 ENCOUNTER — Ambulatory Visit: Payer: Medicare Other | Admitting: Physical Therapy

## 2023-04-14 ENCOUNTER — Encounter: Payer: Self-pay | Admitting: Physical Therapy

## 2023-04-14 DIAGNOSIS — M6281 Muscle weakness (generalized): Secondary | ICD-10-CM

## 2023-04-14 DIAGNOSIS — R600 Localized edema: Secondary | ICD-10-CM

## 2023-04-14 DIAGNOSIS — M25562 Pain in left knee: Secondary | ICD-10-CM | POA: Diagnosis not present

## 2023-04-14 DIAGNOSIS — R262 Difficulty in walking, not elsewhere classified: Secondary | ICD-10-CM

## 2023-04-14 DIAGNOSIS — G8929 Other chronic pain: Secondary | ICD-10-CM

## 2023-04-14 NOTE — Therapy (Signed)
OUTPATIENT PHYSICAL THERAPY LOWER EXTREMITY TREATMENT   Patient Name: Brooke Jimenez MRN: [id number] DOB:01-18-1946, 78 y.o., female Today's Date: 04/14/2023  END OF SESSION:  PT End of Session - 04/14/23 1523     Visit Number 9    Date for PT Re-Evaluation 05/20/23    PT Start Time 1523    PT Stop Time 1600    PT Time Calculation (min) 37 min    Activity Tolerance Patient tolerated treatment well    Behavior During Therapy WFL for tasks assessed/performed              Past Medical History:  Diagnosis Date   Acne rosacea    Anxiety    Arthritis    arthritis in hip    Bronchiectasis (HCC)    Cataract    beginning   Complication of anesthesia    "really sore throat"   Corneal dystrophy    DDD (degenerative disc disease), cervical    and lower back   Diverticulosis    Dysphagia    chronic- please see ultrasound done 4/16 15 in [facility]    Dysrhythmia    sometimes patient has extra beats per patient    Esophageal dysmotility    Family history of adverse reaction to anesthesia    mother slow to wake up    GERD (gastroesophageal reflux disease)    under control   Glaucoma    "suspect"   Gout    one finger x1 attack   H/O hiatal hernia    Hearing loss    bilateral due to nerve loss. wears hearing aides   Heart murmur    no problem    History of chicken pox    History of melanoma    melanoma- 1986 and 1997    Hypothyroidism    Low back pain with sciatica    Measles    hx of   Mitral valve prolapse    Nocturia    Osteoporosis    Peripheral neuropathy    feet   PONV (postoperative nausea and vomiting)    PTSD (post-traumatic stress disorder) 2006   RSD (reflex sympathetic dystrophy) 1993   Spider veins    Urinary incontinence    occasional   Varicose veins    Past Surgical History:  Procedure Laterality Date   ESOPHAGEAL MANOMETRY N/A 12/17/2015   Procedure: ESOPHAGEAL MANOMETRY (EM);  Surgeon: Napoleon Form, MD;  [facility]: [facility];   Service: Endoscopy;  Laterality: N/A;   HARDWARE REMOVAL Left 10/17/2013   Procedure: HARDWARE REMOVAL LEFT HIP;  Surgeon: Loanne Drilling, MD;  Location: [facility];  Service: Orthopedics;  Laterality: Left;   HIP FRACTURE SURGERY Left 2006   3 screws   INSERTION OF MESH  02/08/2014   Procedure: INSERTION OF MESH;  Surgeon: Avel Peace, MD;  Location: [facility];  Service: General;;   melanoma surgery   1986, 1997   PARATHYROIDECTOMY  04/02/15   PH IMPEDANCE STUDY N/A 12/17/2015   Procedure: PH IMPEDANCE STUDY;  Surgeon: Napoleon Form, MD;  [facility];  Service: Endoscopy;  Laterality: N/A;   TOTAL HIP ARTHROPLASTY Left 04/10/2014   Procedure: LEFT TOTAL HIP ARTHROPLASTY ANTERIOR APPROACH;  Surgeon: Loanne Drilling, MD;  [facility];  Service: Orthopedics;  Laterality: Left;   UMBILICAL HERNIA REPAIR N/A 02/08/2014   Procedure: HERNIA REPAIR UMBILICAL ADULT WITH MESH;  Surgeon: Avel Peace, MD;  [facility];  Service: General;  Laterality: N/A;   Patient Active Problem  List   Diagnosis Date Noted   Reactive monocytosis 11/29/2021   Rosacea 11/29/2021   Upper respiratory infection 01/15/2018   Postherpetic neuralgia 12/25/2016   Dysesthesia 12/25/2016   Polyneuropathy 12/25/2016   Exposure to influenza 04/01/2016   Dysphagia    Bronchiectasis without acute exacerbation (HCC) 08/30/2015   Bronchiectasis (HCC) 08/14/2015   Chronic rhinitis 08/14/2015   Avascular necrosis of femur head, left (HCC) 04/10/2014   OA (osteoarthritis) of hip 04/10/2014   Hypothyroidism 12/09/2013   Painful orthopaedic hardware (HCC) 10/17/2013    PCP: Dr. Ninetta Lights  REFERRING PROVIDER: Dr. Ollen Gross  REFERRING DIAG: Right knee pain with pes bursitis and chondromalacia patella  THERAPY DIAG:  Chronic pain of left knee  Muscle weakness (generalized)  Difficulty in walking, not elsewhere classified  Localized edema  Rationale for Evaluation and Treatment:  Rehabilitation  ONSET DATE: April 2024  SUBJECTIVE:   SUBJECTIVE STATEMENT: Feeling a little better today  PERTINENT HISTORY: Hip osteoarthritis Avascular necrosis of left femoral head DDD Dysrhythmia Gout Hearing loss LBP with Sciatica Osteoporosis Peripheral neuropathy (feet) Reflex Sympathetic Dystrophy PAIN:  Are you having pain? Yes: NPRS scale: 4-5/10 sporadic Pain location: medial and lateral posterior of R knee Pain description: sharp Aggravating factors: bent for long time, steps, walking for a long period of time, standing for long periods, general movement Relieving factors: medication  PRECAUTIONS: Fall  RED FLAGS: None   WEIGHT BEARING RESTRICTIONS: No  FALLS:  Has patient fallen in last 6 months? Yes. Number of falls 1  LIVING ENVIRONMENT: Lives with: lives with their family and lives with their spouse Lives in: House/apartment Stairs: Yes: Internal: 1 flight steps; on right going up and External: 3 steps; on right going up Has following equipment at home: Single point cane, Walker - 2 wheeled, and Environmental consultant - 4 wheeled  OCCUPATION: retired  PLOF: Independent and Independent with basic ADLs  PATIENT GOALS: walk normally, gain confidence with gait  NEXT MD VISIT: none set   OBJECTIVE:  Note: Objective measures were completed at Evaluation unless otherwise noted.  DIAGNOSTIC FINDINGS: cartilage loss ine right knee  PATIENT SURVEYS:  FOTO 43  COGNITION: Overall cognitive status: Within functional limits for tasks assessed      POSTURE: rounded shoulders  PALPATION: Medial and Lateral right knee TTP  LOWER EXTREMITY ROM:  Active ROM Right eval Left eval  Hip flexion [facility]  Hip extension    Hip abduction    Hip adduction    Hip internal rotation    Hip external rotation    Knee flexion WFL* WFL  Knee extension Lack 25* WFL  Ankle dorsiflexion [facility]  Ankle plantarflexion [facility] WFL  Ankle inversion    Ankle eversion     (Blank  rows = not tested, * = pain)  LOWER EXTREMITY MMT:  MMT Right eval Left eval  Hip flexion 4/5 4/5  Hip extension    Hip abduction 4/5 4/5  Hip adduction 4/5 4/5  Hip internal rotation    Hip external rotation    Knee flexion 3+/5 4-/5  Knee extension 3+/5* 4-/5  Ankle dorsiflexion    Ankle plantarflexion    Ankle inversion    Ankle eversion     (Blank rows = not tested, * = pain   FUNCTIONAL TESTS:  5 times sit to stand: 33 seconds Timed up and go (TUG): 23 seconds  TREATMENT DATE:  04/14/23 NuStep L 5 x 8 min Sit to stands 2x10  LAQ 2lb 2x10 Seated March 2lb 2x10 HS Curls green 2x10 4in step ups HHA x 1 x 10 each  04/09/23 NuStep L 5 x 8 min STM semi tendinosis and insertion pint muscles PROM to bilat LE Hamstring, piriformis, K2x RLE LAQ 2x10 RLE HS curls yellow 2x10 RLE hip flex Hip add ball squeeze  S2S x5   04/07/23 NuStep L 5 x 6 min HS Curls 20lb 2x12  Leg Ext 10lb 2x12 6in step ups from airex w/ HHA x2  S2S x5, S2S on airex 2x5  Hip abd green 2x15 Lateral Step ups 4in HHA x1  04/02/23 NuStep L5x35mins  Calf raises 2x10  Calf stretch 30s  HS Curls 20lb 2x10  Leg Ext 10lb 2x10 Walking on beam  4 in step ups    03/26/23 NuStep L 5 x 6 min GOALS S2S holding 3lb dumbbell 4in step ups 2x5 Heel raises 2x10 HS Curls 20lb 2x10  Leg Ext 5lb 2x10, 10lb x10 Rows green 2x15 Shoulder Ext green 2x15  03/24/23 NuStep L 4 x 6 min Resisted gait forward backward 20lb x3 4in lateral step ups x10 HHAx1 S2S 2x10 HS Curls 20lb 2x10  Leg Ext 5lb 2x10 MT  STM to R kne and surrounding streutures  R knee patellar mobs  R knee PROM  03/19/23 NuStep L 4 x 6 min MT  STM to R kne and surrounding streutures  R knee patellar mobs  R knee PROM LAQ RLE 2lb 2x10 HS curls RLE  yellow 2x10 S2S 2x5 each 4in step ups 2x5 each Slant board  calf stretch   03/17/23 NuStep L 4 x 6 min MT  STM to R kne and surrounding streutures  R knee patellar mobs  R knee PROM LAQ RLE 2lb x10, x5 HS curls RLE  yellow x10, x5  Hip add blue ball 2x10 Hip abd red bald 2x10  03/11/23: Eval    PATIENT EDUCATION:  Education details: POC and HEP Person educated: Patient Education method: Explanation Education comprehension: verbalized understanding  HOME EXERCISE PROGRAM: Standing Knee flexion at the counter, 2x10 Standing Marches at the counter, 2x10 Seated LAQ, 2x10  ASSESSMENT:  CLINICAL IMPRESSION: Patient is a 78 y.o. woman who was seen today for physical therapy treatment for right knee pain with pes bursitis and chondromalacia patella. Pt ~8 minutes late. Pt enters feeling better compared to last session able to tolerated a more active treatment session. Cues for sequencing needed with sit to stands. Cue to slow down needed with sated march. Pt will benefit from skilled PT to help increase strength in both LEs and decrease pain in the knee to improve QOL and gait pattern as well as ease with ADLs.   OBJECTIVE IMPAIRMENTS: Abnormal gait, cardiopulmonary status limiting activity, decreased balance, decreased coordination, decreased endurance, difficulty walking, decreased ROM, decreased strength, and pain.   ACTIVITY LIMITATIONS: carrying, lifting, bending, sitting, standing, squatting, stairs, transfers, bed mobility, and continence  PARTICIPATION LIMITATIONS: cleaning, laundry, and community activity  PERSONAL FACTORS: Age, Fitness, and Time since onset of injury/illness/exacerbation are also affecting patient's functional outcome.   REHAB POTENTIAL: Good  CLINICAL DECISION MAKING: Stable/uncomplicated  EVALUATION COMPLEXITY: Low   GOALS: Goals reviewed with patient? Yes  SHORT TERM GOALS: Target date: 04/15/23 Patient will be independent with initial home program.  Baseline: Goal status: Met 03/24/23  2.  Pt will  improve 5x STS to 25 seconds. Baseline: 33s Goal  status: Met 17.3 sec 03/27/23  LONG TERM GOALS: Target date: 05/20/23  Pt will be independent with long term HEP for carryover and improvement of ADL difficulty. Baseline:  Goal status: INITIAL  2.  Pt will improve 5x STS to 20 seconds. Baseline: 33 sec (03/11/23) Goal status: Ongoing 03/26/23  3.  Pt will improve TUG to 17 sec Baseline: 23 sec (03/11/23) Goal status: Met 15.62 03/26/23  4. Pt will improve knee strength to 4/5 or better Baseline: 3+/5 with pain Goal status: INITIAL    PLAN:  PT FREQUENCY: 2x/week  PT DURATION: 10 weeks  PLANNED INTERVENTIONS: 97110-Therapeutic exercises, 97530- Therapeutic activity, 97112- Neuromuscular re-education, 97535- Self Care, and 16109- Manual therapy  PLAN FOR NEXT SESSION: decrease pain, strengthen quads and hamstrings, gait training   Grayce Sessions, PTA 04/14/2023, 3:26 PM

## 2023-04-16 ENCOUNTER — Ambulatory Visit: Payer: Medicare Other | Admitting: Physical Therapy

## 2023-04-16 DIAGNOSIS — R262 Difficulty in walking, not elsewhere classified: Secondary | ICD-10-CM

## 2023-04-16 DIAGNOSIS — M25562 Pain in left knee: Secondary | ICD-10-CM | POA: Diagnosis not present

## 2023-04-16 DIAGNOSIS — G8929 Other chronic pain: Secondary | ICD-10-CM

## 2023-04-16 DIAGNOSIS — M6281 Muscle weakness (generalized): Secondary | ICD-10-CM

## 2023-04-16 NOTE — Therapy (Signed)
OUTPATIENT PHYSICAL THERAPY LOWER EXTREMITY TREATMENT Progress Note Reporting Period 03/11/23 to 04/16/23   See note below for Objective Data and Assessment of Progress/Goals.      Patient Name: Brooke Jimenez MRN: [id number] DOB:[birth year], 78 y.o., female Today's Date: 04/16/2023  END OF SESSION:  PT End of Session - 04/16/23 1628     Visit Number 10    Date for PT Re-Evaluation 05/20/23    PT Start Time 1627    PT Stop Time 1710    PT Time Calculation (min) 43 min              Past Medical History:  Diagnosis Date   Acne rosacea    Anxiety    Arthritis    arthritis in hip    Bronchiectasis (HCC)    Cataract    beginning   Complication of anesthesia    "really sore throat"   Corneal dystrophy    DDD (degenerative disc disease), cervical    and lower back   Diverticulosis    Dysphagia    chronic- please see ultrasound done 4/16 15 in [facility]    Dysrhythmia    sometimes patient has extra beats per patient    Esophageal dysmotility    Family history of adverse reaction to anesthesia    mother slow to wake up    GERD (gastroesophageal reflux disease)    under control   Glaucoma    "suspect"   Gout    one finger x1 attack   H/O hiatal hernia    Hearing loss    bilateral due to nerve loss. wears hearing aides   Heart murmur    no problem    History of chicken pox    History of melanoma    melanoma- 1986 and 1997    Hypothyroidism    Low back pain with sciatica    Measles    hx of   Mitral valve prolapse    Nocturia    Osteoporosis    Peripheral neuropathy    feet   PONV (postoperative nausea and vomiting)    PTSD (post-traumatic stress disorder) 2006   RSD (reflex sympathetic dystrophy) 1993   Spider veins    Urinary incontinence    occasional   Varicose veins    Past Surgical History:  Procedure Laterality Date   ESOPHAGEAL MANOMETRY N/A 12/17/2015   Procedure: ESOPHAGEAL MANOMETRY (EM);  Surgeon: Napoleon Form, MD;  [facility];  Service: Endoscopy;  Laterality: N/A;   HARDWARE REMOVAL Left 10/17/2013   Procedure: HARDWARE REMOVAL LEFT HIP;  Surgeon: Loanne Drilling, MD;  Location: [facility];  Service: Orthopedics;  Laterality: Left;   HIP FRACTURE SURGERY Left 2006   3 screws   INSERTION OF MESH  02/08/2014   Procedure: INSERTION OF MESH;  Surgeon: Avel Peace, MD;  [facility]: [facility];  Service: General;;   melanoma surgery   1986, 1997   PARATHYROIDECTOMY  04/02/15   PH IMPEDANCE STUDY N/A 12/17/2015   Procedure: PH IMPEDANCE STUDY;  Surgeon: Napoleon Form, MD;  [facility];  Service: Endoscopy;  Laterality: N/A;   TOTAL HIP ARTHROPLASTY Left 04/10/2014   Procedure: LEFT TOTAL HIP ARTHROPLASTY ANTERIOR APPROACH;  Surgeon: Loanne Drilling, MD;  [facility];  Service: Orthopedics;  Laterality: Left;   UMBILICAL HERNIA REPAIR N/A 02/08/2014   Procedure: HERNIA REPAIR UMBILICAL ADULT WITH MESH;  Surgeon: Avel Peace, MD;  [facility]: [facility];  Service: General;  Laterality: N/A;  Patient Active Problem List   Diagnosis Date Noted   Reactive monocytosis 11/29/2021   Rosacea 11/29/2021   Upper respiratory infection 01/15/2018   Postherpetic neuralgia 12/25/2016   Dysesthesia 12/25/2016   Polyneuropathy 12/25/2016   Exposure to influenza 04/01/2016   Dysphagia    Bronchiectasis without acute exacerbation (HCC) 08/30/2015   Bronchiectasis (HCC) 08/14/2015   Chronic rhinitis 08/14/2015   Avascular necrosis of femur head, left (HCC) 04/10/2014   OA (osteoarthritis) of hip 04/10/2014   Hypothyroidism 12/09/2013   Painful orthopaedic hardware (HCC) 10/17/2013    PCP: Dr. Ninetta Lights  REFERRING PROVIDER: Dr. Ollen Gross  REFERRING DIAG: Right knee pain with pes bursitis and chondromalacia patella  THERAPY DIAG:  Chronic pain of left knee  Muscle weakness (generalized)  Difficulty in walking, not elsewhere classified  Rationale for Evaluation and Treatment:  Rehabilitation  ONSET DATE: April 2024  SUBJECTIVE:   SUBJECTIVE STATEMENT: Turned today and knee buckled on me, a bit more sore. Hurts in bed esp when I try to straighten it out  PERTINENT HISTORY: Hip osteoarthritis Avascular necrosis of left femoral head DDD Dysrhythmia Gout Hearing loss LBP with Sciatica Osteoporosis Peripheral neuropathy (feet) Reflex Sympathetic Dystrophy PAIN:  Are you having pain? Yes: NPRS scale: 4-5/10 sporadic Pain location: medial and lateral posterior of R knee Pain description: sharp Aggravating factors: bent for long time, steps, walking for a long period of time, standing for long periods, general movement Relieving factors: medication  PRECAUTIONS: Fall  RED FLAGS: None   WEIGHT BEARING RESTRICTIONS: No  FALLS:  Has patient fallen in last 6 months? Yes. Number of falls 1  LIVING ENVIRONMENT: Lives with: lives with their family and lives with their spouse Lives in: House/apartment Stairs: Yes: Internal: 1 flight steps; on right going up and External: 3 steps; on right going up Has following equipment at home: Single point cane, Walker - 2 wheeled, and Environmental consultant - 4 wheeled  OCCUPATION: retired  PLOF: Independent and Independent with basic ADLs  PATIENT GOALS: walk normally, gain confidence with gait  NEXT MD VISIT: none set   OBJECTIVE:  Note: Objective measures were completed at Evaluation unless otherwise noted.  DIAGNOSTIC FINDINGS: cartilage loss ine right knee  PATIENT SURVEYS:  FOTO 43  COGNITION: Overall cognitive status: Within functional limits for tasks assessed      POSTURE: rounded shoulders  PALPATION: Medial and Lateral right knee TTP  LOWER EXTREMITY ROM:  Active ROM Right eval Left eval  Hip flexion [facility]  Hip extension    Hip abduction    Hip adduction    Hip internal rotation    Hip external rotation    Knee flexion WFL* WFL  Knee extension Lack 25* WFL  Ankle dorsiflexion [facility]   Ankle plantarflexion [facility] WFL  Ankle inversion    Ankle eversion     (Blank rows = not tested, * = pain)  LOWER EXTREMITY MMT:  MMT Right eval Left eval  Hip flexion 4/5 4/5  Hip extension    Hip abduction 4/5 4/5  Hip adduction 4/5 4/5  Hip internal rotation    Hip external rotation    Knee flexion 3+/5 4-/5  Knee extension 3+/5* 4-/5  Ankle dorsiflexion    Ankle plantarflexion    Ankle inversion    Ankle eversion     (Blank rows = not tested, * = pain   FUNCTIONAL TESTS:  5 times sit to stand: 33 seconds Timed up and go (TUG): 23 seconds  TREATMENT DATE:  04/16/23 Nustep L 5 RT knee Active ext sitting -5 with pain MMT RT knee ext 4- with pain and HS 4/5 STW to medial and lateral knee, jt mobs left knee for jt capsule stretching HS Curls green 2x10 LAQ 2# 2 x 10 2# hip flex and abd 2 sets 10 ADD ball squeeze 20 x Ionto RT medial knee 4 hour patch 1.2 cc dex   04/14/23 NuStep L 5 x 8 min Sit to stands 2x10  LAQ 2lb 2x10 Seated March 2lb 2x10 HS Curls green 2x10 4in step ups HHA x 1 x 10 each  04/09/23 NuStep L 5 x 8 min STM semi tendinosis and insertion pint muscles PROM to bilat LE Hamstring, piriformis, K2x RLE LAQ 2x10 RLE HS curls yellow 2x10 RLE hip flex Hip add ball squeeze  S2S x5   04/07/23 NuStep L 5 x 6 min HS Curls 20lb 2x12  Leg Ext 10lb 2x12 6in step ups from airex w/ HHA x2  S2S x5, S2S on airex 2x5  Hip abd green 2x15 Lateral Step ups 4in HHA x1  04/02/23 NuStep L5x6mins  Calf raises 2x10  Calf stretch 30s  HS Curls 20lb 2x10  Leg Ext 10lb 2x10 Walking on beam  4 in step ups    03/26/23 NuStep L 5 x 6 min GOALS S2S holding 3lb dumbbell 4in step ups 2x5 Heel raises 2x10 HS Curls 20lb 2x10  Leg Ext 5lb 2x10, 10lb x10 Rows green 2x15 Shoulder Ext green 2x15  03/24/23 NuStep L 4 x 6  min Resisted gait forward backward 20lb x3 4in lateral step ups x10 HHAx1 S2S 2x10 HS Curls 20lb 2x10  Leg Ext 5lb 2x10 MT  STM to R kne and surrounding streutures  R knee patellar mobs  R knee PROM  03/19/23 NuStep L 4 x 6 min MT  STM to R kne and surrounding streutures  R knee patellar mobs  R knee PROM LAQ RLE 2lb 2x10 HS curls RLE  yellow 2x10 S2S 2x5 each 4in step ups 2x5 each Slant board calf stretch   03/17/23 NuStep L 4 x 6 min MT  STM to R kne and surrounding streutures  R knee patellar mobs  R knee PROM LAQ RLE 2lb x10, x5 HS curls RLE  yellow x10, x5  Hip add blue ball 2x10 Hip abd red bald 2x10  03/11/23: Eval    PATIENT EDUCATION:  Education details: POC and HEP Person educated: Patient Education method: Explanation Education comprehension: verbalized understanding  HOME EXERCISE PROGRAM: Standing Knee flexion at the counter, 2x10 Standing Marches at the counter, 2x10 Seated LAQ, 2x10  ASSESSMENT:  CLINICAL IMPRESSION: Patient is a 78 y.o. woman who was seen today for physical therapy treatment for right knee pain with pes bursitis and chondromalacia patella. Pt states turned and knee buckled and c/o increased pain. Increased ROM and MMT noted and goals documented. Lacking TKE and very tender in knee medial and laterally but more medial so tried adding ionto patch today. 2 new goals added for pain and ROM.   OBJECTIVE IMPAIRMENTS: Abnormal gait, cardiopulmonary status limiting activity, decreased balance, decreased coordination, decreased endurance, difficulty walking, decreased ROM, decreased strength, and pain.   ACTIVITY LIMITATIONS: carrying, lifting, bending, sitting, standing, squatting, stairs, transfers, bed mobility, and continence  PARTICIPATION LIMITATIONS: cleaning, laundry, and community activity  PERSONAL FACTORS: Age, Fitness, and Time since onset of injury/illness/exacerbation are also affecting patient's functional outcome.    REHAB POTENTIAL: Good  CLINICAL DECISION  MAKING: Stable/uncomplicated  EVALUATION COMPLEXITY: Low   GOALS: Goals reviewed with patient? Yes  SHORT TERM GOALS: Target date: 04/15/23 Patient will be independent with initial home program.  Baseline: Goal status: Met 03/24/23  2.  Pt will improve 5x STS to 25 seconds. Baseline: 33s Goal status: Met 17.3 sec 03/27/23  LONG TERM GOALS: Target date: 05/20/23  Pt will be independent with long term HEP for carryover and improvement of ADL difficulty. Baseline:  Goal status: progressing 04/16/23  2.  Pt will improve 5x STS to 20 seconds. Baseline: 33 sec (03/11/23) Goal status: Ongoing 03/26/23   04/16/23  18.71 MET  3.  Pt will improve TUG to 17 sec Baseline: 23 sec (03/11/23) Goal status: Met 15.62 03/26/23  4. Pt will improve knee strength to 4/5 or better Baseline: 3+/5 with pain Goal status: 04/16/23 met for HS  5. Patient will report decrease in pain levels to <2/10 or better  Baseline: 4-5/10  Goal status: INITIAL  6. Patient will achieve active terminal knee extension to 0 degrees  Baseline: -5 active extension   Goal status: INITIAL     PLAN:  PT FREQUENCY: 2x/week  PT DURATION: 10 weeks  PLANNED INTERVENTIONS: 97110-Therapeutic exercises, 97530- Therapeutic activity, 97112- Neuromuscular re-education, 97535- Self Care, and 29562- Manual therapy  PLAN FOR NEXT SESSION: decrease pain, strengthen quads and hamstrings, gait training   Cassie Freer, PT 04/16/2023, 5:19 PM [place] [facility] [address] [place], [place], [address] Phone: [phone]   Fax:  [phone]  Patient Details  Name: Brooke Jimenez MRN: [id number] Date of Birth: 1945/10/29 Referring Provider:  Laqueta Due., MD  Encounter Date: 04/16/2023   Cassie Freer, PT 04/16/2023, 5:19 PM  [place] [address] [place],  [place], [address] Phone: [phone]   Fax:  [phone]

## 2023-04-20 ENCOUNTER — Ambulatory Visit (INDEPENDENT_AMBULATORY_CARE_PROVIDER_SITE_OTHER): Payer: Medicare Other

## 2023-04-20 DIAGNOSIS — I4892 Unspecified atrial flutter: Secondary | ICD-10-CM | POA: Diagnosis not present

## 2023-04-21 ENCOUNTER — Encounter: Payer: Self-pay | Admitting: Physical Therapy

## 2023-04-21 ENCOUNTER — Ambulatory Visit: Payer: Medicare Other | Admitting: Physical Therapy

## 2023-04-21 DIAGNOSIS — R600 Localized edema: Secondary | ICD-10-CM

## 2023-04-21 DIAGNOSIS — G8929 Other chronic pain: Secondary | ICD-10-CM

## 2023-04-21 DIAGNOSIS — R262 Difficulty in walking, not elsewhere classified: Secondary | ICD-10-CM

## 2023-04-21 DIAGNOSIS — M25562 Pain in left knee: Secondary | ICD-10-CM | POA: Diagnosis not present

## 2023-04-21 DIAGNOSIS — M6281 Muscle weakness (generalized): Secondary | ICD-10-CM

## 2023-04-21 NOTE — Therapy (Signed)
 OUTPATIENT PHYSICAL THERAPY LOWER EXTREMITY TREATMENT Progress Note Reporting Period 03/11/23 to 04/16/23   See note below for Objective Data and Assessment of Progress/Goals.      Patient Name: Brooke Jimenez MRN: [id number] DOB:May 28, 1945, 78 y.o., female Today's Date: 04/21/2023  END OF SESSION:  PT End of Session - 04/21/23 1559     Visit Number 11    PT Start Time 1600    PT Stop Time 1645    PT Time Calculation (min) 45 min    Activity Tolerance Patient tolerated treatment well    Behavior During Therapy WFL for tasks assessed/performed              Past Medical History:  Diagnosis Date   Acne rosacea    Anxiety    Arthritis    arthritis in hip    Bronchiectasis (HCC)    Cataract    beginning   Complication of anesthesia    "really sore throat"   Corneal dystrophy    DDD (degenerative disc disease), cervical    and lower back   Diverticulosis    Dysphagia    chronic- please see ultrasound done 4/16 15 in [facility]    Dysrhythmia    sometimes patient has extra beats per patient    Esophageal dysmotility    Family history of adverse reaction to anesthesia    mother slow to wake up    GERD (gastroesophageal reflux disease)    under control   Glaucoma    "suspect"   Gout    one finger x1 attack   H/O hiatal hernia    Hearing loss    bilateral due to nerve loss. wears hearing aides   Heart murmur    no problem    History of chicken pox    History of melanoma    melanoma- 1986 and 1997    Hypothyroidism    Low back pain with sciatica    Measles    hx of   Mitral valve prolapse    Nocturia    Osteoporosis    Peripheral neuropathy    feet   PONV (postoperative nausea and vomiting)    PTSD (post-traumatic stress disorder) 2006   RSD (reflex sympathetic dystrophy) 1993   Spider veins    Urinary incontinence    occasional   Varicose veins    Past Surgical History:  Procedure Laterality Date   ESOPHAGEAL MANOMETRY N/A 12/17/2015   Procedure:  ESOPHAGEAL MANOMETRY (EM);  Surgeon: Napoleon Form, MD;  [facility]: [facility];  Service: Endoscopy;  Laterality: N/A;   HARDWARE REMOVAL Left 10/17/2013   Procedure: HARDWARE REMOVAL LEFT HIP;  Surgeon: Loanne Drilling, MD;  Location: [facility];  Service: Orthopedics;  Laterality: Left;   HIP FRACTURE SURGERY Left 2006   3 screws   INSERTION OF MESH  02/08/2014   Procedure: INSERTION OF MESH;  Surgeon: Avel Peace, MD;  Location: [facility];  Service: General;;   melanoma surgery   1986, 1997   PARATHYROIDECTOMY  04/02/15   PH IMPEDANCE STUDY N/A 12/17/2015   Procedure: PH IMPEDANCE STUDY;  Surgeon: Napoleon Form, MD;  [facility];  Service: Endoscopy;  Laterality: N/A;   TOTAL HIP ARTHROPLASTY Left 04/10/2014   Procedure: LEFT TOTAL HIP ARTHROPLASTY ANTERIOR APPROACH;  Surgeon: Loanne Drilling, MD;  [facility];  Service: Orthopedics;  Laterality: Left;   UMBILICAL HERNIA REPAIR N/A 02/08/2014   Procedure: HERNIA REPAIR UMBILICAL ADULT WITH MESH;  Surgeon: Avel Peace, MD;  [facility];  Service: General;  Laterality: N/A;   Patient Active Problem List   Diagnosis Date Noted   Reactive monocytosis 11/29/2021   Rosacea 11/29/2021   Upper respiratory infection 01/15/2018   Postherpetic neuralgia 12/25/2016   Dysesthesia 12/25/2016   Polyneuropathy 12/25/2016   Exposure to influenza 04/01/2016   Dysphagia    Bronchiectasis without acute exacerbation (HCC) 08/30/2015   Bronchiectasis (HCC) 08/14/2015   Chronic rhinitis 08/14/2015   Avascular necrosis of femur head, left (HCC) 04/10/2014   OA (osteoarthritis) of hip 04/10/2014   Hypothyroidism 12/09/2013   Painful orthopaedic hardware (HCC) 10/17/2013    PCP: Dr. Ninetta Lights  REFERRING PROVIDER: Dr. Ollen Gross  REFERRING DIAG: Right knee pain with pes bursitis and chondromalacia patella  THERAPY DIAG:  Chronic pain of left knee  Muscle weakness (generalized)  Difficulty in walking, not  elsewhere classified  Localized edema  Rationale for Evaluation and Treatment: Rehabilitation  ONSET DATE: April 2024  SUBJECTIVE:   SUBJECTIVE STATEMENT: Knee has buckled few time since last visit ~ 2-3 times. Painful when it happens. Every step is uncomfortable but isn't painful. Ionto patch did cause her heart rate to increase.   PERTINENT HISTORY: Hip osteoarthritis Avascular necrosis of left femoral head DDD Dysrhythmia Gout Hearing loss LBP with Sciatica Osteoporosis Peripheral neuropathy (feet) Reflex Sympathetic Dystrophy PAIN:  Are you having pain? Yes: NPRS scale: 4-5/10 sporadic Pain location: medial and lateral posterior of R knee Pain description: sharp Aggravating factors: bent for long time, steps, walking for a long period of time, standing for long periods, general movement Relieving factors: medication  PRECAUTIONS: Fall  RED FLAGS: None   WEIGHT BEARING RESTRICTIONS: No  FALLS:  Has patient fallen in last 6 months? Yes. Number of falls 1  LIVING ENVIRONMENT: Lives with: lives with their family and lives with their spouse Lives in: House/apartment Stairs: Yes: Internal: 1 flight steps; on right going up and External: 3 steps; on right going up Has following equipment at home: Single point cane, Walker - 2 wheeled, and Environmental consultant - 4 wheeled  OCCUPATION: retired  PLOF: Independent and Independent with basic ADLs  PATIENT GOALS: walk normally, gain confidence with gait  NEXT MD VISIT: none set   OBJECTIVE:  Note: Objective measures were completed at Evaluation unless otherwise noted.  DIAGNOSTIC FINDINGS: cartilage loss ine right knee  PATIENT SURVEYS:  FOTO 43  COGNITION: Overall cognitive status: Within functional limits for tasks assessed      POSTURE: rounded shoulders  PALPATION: Medial and Lateral right knee TTP  LOWER EXTREMITY ROM:  Active ROM Right eval Left eval  Hip flexion [facility]  Hip extension    Hip abduction     Hip adduction    Hip internal rotation    Hip external rotation    Knee flexion WFL* WFL  Knee extension Lack 25* WFL  Ankle dorsiflexion [facility]  Ankle plantarflexion [facility] WFL  Ankle inversion    Ankle eversion     (Blank rows = not tested, * = pain)  LOWER EXTREMITY MMT:  MMT Right eval Left eval  Hip flexion 4/5 4/5  Hip extension    Hip abduction 4/5 4/5  Hip adduction 4/5 4/5  Hip internal rotation    Hip external rotation    Knee flexion 3+/5 4-/5  Knee extension 3+/5* 4-/5  Ankle dorsiflexion    Ankle plantarflexion    Ankle inversion    Ankle eversion     (Blank rows = not tested, * =  pain   FUNCTIONAL TESTS:  5 times sit to stand: 33 seconds Timed up and go (TUG): 23 seconds                                                                                                                               TREATMENT DATE:  04/21/23 NuStep L5 x 6 min MT  STM to R knee and surrounding structures  R knee patellar mobs  R knee PROM HS curls RLE green 2x10 RLE 2lb 2x10  RLE LAQ 2lb 2x10 S2S x10, x7 ADD ball squeeze 20 x   04/16/23 Nustep L 5 RT knee Active ext sitting -5 with pain MMT RT knee ext 4- with pain and HS 4/5 STW to medial and lateral knee, jt mobs left knee for jt capsule stretching HS Curls green 2x10 LAQ 2# 2 x 10 2# hip flex and abd 2 sets 10 ADD ball squeeze 20 x Ionto RT medial knee 4 hour patch 1.2 cc dex   04/14/23 NuStep L 5 x 8 min Sit to stands 2x10  LAQ 2lb 2x10 Seated March 2lb 2x10 HS Curls green 2x10 4in step ups HHA x 1 x 10 each  04/09/23 NuStep L 5 x 8 min STM semi tendinosis and insertion pint muscles PROM to bilat LE Hamstring, piriformis, K2x RLE LAQ 2x10 RLE HS curls yellow 2x10 RLE hip flex Hip add ball squeeze  S2S x5   04/07/23 NuStep L 5 x 6 min HS Curls 20lb 2x12  Leg Ext 10lb 2x12 6in step ups from airex w/ HHA x2  S2S x5, S2S on airex 2x5  Hip abd green 2x15 Lateral Step ups 4in HHA  x1  04/02/23 NuStep L5x69mins  Calf raises 2x10  Calf stretch 30s  HS Curls 20lb 2x10  Leg Ext 10lb 2x10 Walking on beam  4 in step ups    03/26/23 NuStep L 5 x 6 min GOALS S2S holding 3lb dumbbell 4in step ups 2x5 Heel raises 2x10 HS Curls 20lb 2x10  Leg Ext 5lb 2x10, 10lb x10 Rows green 2x15 Shoulder Ext green 2x15  03/24/23 NuStep L 4 x 6 min Resisted gait forward backward 20lb x3 4in lateral step ups x10 HHAx1 S2S 2x10 HS Curls 20lb 2x10  Leg Ext 5lb 2x10 MT  STM to R kne and surrounding streutures  R knee patellar mobs  R knee PROM  03/19/23 NuStep L 4 x 6 min MT  STM to R kne and surrounding streutures  R knee patellar mobs  R knee PROM LAQ RLE 2lb 2x10 HS curls RLE  yellow 2x10 S2S 2x5 each 4in step ups 2x5 each Slant board calf stretch   03/17/23 NuStep L 4 x 6 min MT  STM to R kne and surrounding streutures  R knee patellar mobs  R knee PROM LAQ RLE 2lb x10, x5 HS curls RLE  yellow x10, x5  Hip add blue ball 2x10 Hip abd red bald  2x10  03/11/23: Eval    PATIENT EDUCATION:  Education details: POC and HEP Person educated: Patient Education method: Explanation Education comprehension: verbalized understanding  HOME EXERCISE PROGRAM: Standing Knee flexion at the counter, 2x10 Standing Marches at the counter, 2x10 Seated LAQ, 2x10  ASSESSMENT:  CLINICAL IMPRESSION: Patient is a 78 y.o. woman who was seen today for physical therapy treatment for right knee pain with pes bursitis and chondromalacia patella. Pt reports a few more occurences of knee buckling since last session. Pt remains very tender in knee medial and laterally but more medial, some warmth also noted int he R knee. Avoided ionto due to reprots of increase HR. Increase fatigue with second set of sit to stands. Some tenderness reported with HS curls   OBJECTIVE IMPAIRMENTS: Abnormal gait, cardiopulmonary status limiting activity, decreased balance, decreased coordination, decreased  endurance, difficulty walking, decreased ROM, decreased strength, and pain.   ACTIVITY LIMITATIONS: carrying, lifting, bending, sitting, standing, squatting, stairs, transfers, bed mobility, and continence  PARTICIPATION LIMITATIONS: cleaning, laundry, and community activity  PERSONAL FACTORS: Age, Fitness, and Time since onset of injury/illness/exacerbation are also affecting patient's functional outcome.   REHAB POTENTIAL: Good  CLINICAL DECISION MAKING: Stable/uncomplicated  EVALUATION COMPLEXITY: Low   GOALS: Goals reviewed with patient? Yes  SHORT TERM GOALS: Target date: 04/15/23 Patient will be independent with initial home program.  Baseline: Goal status: Met 03/24/23  2.  Pt will improve 5x STS to 25 seconds. Baseline: 33s Goal status: Met 17.3 sec 03/27/23  LONG TERM GOALS: Target date: 05/20/23  Pt will be independent with long term HEP for carryover and improvement of ADL difficulty. Baseline:  Goal status: progressing 04/16/23  2.  Pt will improve 5x STS to 20 seconds. Baseline: 33 sec (03/11/23) Goal status: Ongoing 03/26/23   04/16/23  18.71 MET  3.  Pt will improve TUG to 17 sec Baseline: 23 sec (03/11/23) Goal status: Met 15.62 03/26/23  4. Pt will improve knee strength to 4/5 or better Baseline: 3+/5 with pain Goal status: 04/16/23 met for HS  5. Patient will report decrease in pain levels to <2/10 or better  Baseline: 4-5/10  Goal status: INITIAL  6. Patient will achieve active terminal knee extension to 0 degrees  Baseline: -5 active extension   Goal status: INITIAL     PLAN:  PT FREQUENCY: 2x/week  PT DURATION: 10 weeks  PLANNED INTERVENTIONS: 97110-Therapeutic exercises, 97530- Therapeutic activity, 97112- Neuromuscular re-education, 97535- Self Care, and 16109- Manual therapy  PLAN FOR NEXT SESSION: decrease pain, strengthen quads and hamstrings, gait training   Grayce Sessions, PTA

## 2023-04-22 LAB — CUP PACEART REMOTE DEVICE CHECK
Date Time Interrogation Session: 20250225141524
Implantable Pulse Generator Implant Date: 20231024
Pulse Gen Model: 5000
Pulse Gen Serial Number: 511011009

## 2023-04-23 ENCOUNTER — Ambulatory Visit: Payer: Medicare Other | Admitting: Physical Therapy

## 2023-04-23 DIAGNOSIS — M6281 Muscle weakness (generalized): Secondary | ICD-10-CM

## 2023-04-23 DIAGNOSIS — M25562 Pain in left knee: Secondary | ICD-10-CM | POA: Diagnosis not present

## 2023-04-23 DIAGNOSIS — G8929 Other chronic pain: Secondary | ICD-10-CM

## 2023-04-23 DIAGNOSIS — R262 Difficulty in walking, not elsewhere classified: Secondary | ICD-10-CM

## 2023-04-23 NOTE — Addendum Note (Signed)
 Addended by: Geralyn Flash D on: 04/23/2023 03:42 PM   Modules accepted: Orders

## 2023-04-23 NOTE — Progress Notes (Signed)
 [address] [employer]

## 2023-04-23 NOTE — Therapy (Signed)
 OUTPATIENT PHYSICAL THERAPY LOWER EXTREMITY TREATMENT     Patient Name: Brooke Jimenez MRN: [id number] DOB:[birth year], 78 y.o., female Today's Date: 04/23/2023  END OF SESSION:  PT End of Session - 04/23/23 1615     Visit Number 12    Date for PT Re-Evaluation 05/20/23    PT Start Time 1615    PT Stop Time 1700    PT Time Calculation (min) 45 min              Past Medical History:  Diagnosis Date   Acne rosacea    Anxiety    Arthritis    arthritis in hip    Bronchiectasis (HCC)    Cataract    beginning   Complication of anesthesia    "really sore throat"   Corneal dystrophy    DDD (degenerative disc disease), cervical    and lower back   Diverticulosis    Dysphagia    chronic- please see ultrasound done 4/16 15 in [facility]    Dysrhythmia    sometimes patient has extra beats per patient    Esophageal dysmotility    Family history of adverse reaction to anesthesia    mother slow to wake up    GERD (gastroesophageal reflux disease)    under control   Glaucoma    "suspect"   Gout    one finger x1 attack   H/O hiatal hernia    Hearing loss    bilateral due to nerve loss. wears hearing aides   Heart murmur    no problem    History of chicken pox    History of melanoma    melanoma- 1986 and 1997    Hypothyroidism    Low back pain with sciatica    Measles    hx of   Mitral valve prolapse    Nocturia    Osteoporosis    Peripheral neuropathy    feet   PONV (postoperative nausea and vomiting)    PTSD (post-traumatic stress disorder) 2006   RSD (reflex sympathetic dystrophy) 1993   Spider veins    Urinary incontinence    occasional   Varicose veins    Past Surgical History:  Procedure Laterality Date   ESOPHAGEAL MANOMETRY N/A 12/17/2015   Procedure: ESOPHAGEAL MANOMETRY (EM);  Surgeon: Napoleon Form, MD;  [facility];  Service: Endoscopy;  Laterality: N/A;   HARDWARE REMOVAL Left 10/17/2013   Procedure: HARDWARE REMOVAL LEFT HIP;   Surgeon: Loanne Drilling, MD;  [facility];  Service: Orthopedics;  Laterality: Left;   HIP FRACTURE SURGERY Left 2006   3 screws   INSERTION OF MESH  02/08/2014   Procedure: INSERTION OF MESH;  Surgeon: Avel Peace, MD;  [facility]: [facility];  Service: General;;   melanoma surgery   1986, 1997   PARATHYROIDECTOMY  04/02/15   PH IMPEDANCE STUDY N/A 12/17/2015   Procedure: PH IMPEDANCE STUDY;  Surgeon: Napoleon Form, MD;  [facility];  Service: Endoscopy;  Laterality: N/A;   TOTAL HIP ARTHROPLASTY Left 04/10/2014   Procedure: LEFT TOTAL HIP ARTHROPLASTY ANTERIOR APPROACH;  Surgeon: Loanne Drilling, MD;  [facility];  Service: Orthopedics;  Laterality: Left;   UMBILICAL HERNIA REPAIR N/A 02/08/2014   Procedure: HERNIA REPAIR UMBILICAL ADULT WITH MESH;  Surgeon: Avel Peace, MD;  [facility];  Service: General;  Laterality: N/A;   Patient Active Problem List   Diagnosis Date Noted   Reactive monocytosis 11/29/2021   Rosacea 11/29/2021  Upper respiratory infection 01/15/2018   Postherpetic neuralgia 12/25/2016   Dysesthesia 12/25/2016   Polyneuropathy 12/25/2016   Exposure to influenza 04/01/2016   Dysphagia    Bronchiectasis without acute exacerbation (HCC) 08/30/2015   Bronchiectasis (HCC) 08/14/2015   Chronic rhinitis 08/14/2015   Avascular necrosis of femur head, left (HCC) 04/10/2014   OA (osteoarthritis) of hip 04/10/2014   Hypothyroidism 12/09/2013   Painful orthopaedic hardware (HCC) 10/17/2013    PCP: Dr. Ninetta Lights  REFERRING PROVIDER: Dr. Ollen Gross  REFERRING DIAG: Right knee pain with pes bursitis and chondromalacia patella  THERAPY DIAG:  Chronic pain of left knee  Muscle weakness (generalized)  Difficulty in walking, not elsewhere classified  Rationale for Evaluation and Treatment: Rehabilitation  ONSET DATE: April 2024  SUBJECTIVE:   SUBJECTIVE STATEMENT: Knee buckled this morning, I think it usually when I am  turning. Knee is stronger and does not hurt as bad when I stretch it out in bed PERTINENT HISTORY: Hip osteoarthritis Avascular necrosis of left femoral head DDD Dysrhythmia Gout Hearing loss LBP with Sciatica Osteoporosis Peripheral neuropathy (feet) Reflex Sympathetic Dystrophy PAIN:  Are you having pain? Yes: NPRS scale: 4-5/10 sporadic Pain location: medial and lateral posterior of R knee Pain description: sharp Aggravating factors: bent for long time, steps, walking for a long period of time, standing for long periods, general movement Relieving factors: medication  PRECAUTIONS: Fall  RED FLAGS: None   WEIGHT BEARING RESTRICTIONS: No  FALLS:  Has patient fallen in last 6 months? Yes. Number of falls 1  LIVING ENVIRONMENT: Lives with: lives with their family and lives with their spouse Lives in: House/apartment Stairs: Yes: Internal: 1 flight steps; on right going up and External: 3 steps; on right going up Has following equipment at home: Single point cane, Walker - 2 wheeled, and Environmental consultant - 4 wheeled  OCCUPATION: retired  PLOF: Independent and Independent with basic ADLs  PATIENT GOALS: walk normally, gain confidence with gait  NEXT MD VISIT: none set   OBJECTIVE:  Note: Objective measures were completed at Evaluation unless otherwise noted.  DIAGNOSTIC FINDINGS: cartilage loss ine right knee  PATIENT SURVEYS:  FOTO 43  COGNITION: Overall cognitive status: Within functional limits for tasks assessed      POSTURE: rounded shoulders  PALPATION: Medial and Lateral right knee TTP  LOWER EXTREMITY ROM:  Active ROM Right eval Left eval  Hip flexion [facility]  Hip extension    Hip abduction    Hip adduction    Hip internal rotation    Hip external rotation    Knee flexion WFL* WFL  Knee extension Lack 25* WFL  Ankle dorsiflexion [facility]  Ankle plantarflexion [facility] WFL  Ankle inversion    Ankle eversion     (Blank rows = not tested, * =  pain)  LOWER EXTREMITY MMT:  MMT Right eval Left eval  Hip flexion 4/5 4/5  Hip extension    Hip abduction 4/5 4/5  Hip adduction 4/5 4/5  Hip internal rotation    Hip external rotation    Knee flexion 3+/5 4-/5  Knee extension 3+/5* 4-/5  Ankle dorsiflexion    Ankle plantarflexion    Ankle inversion    Ankle eversion     (Blank rows = not tested, * = pain   FUNCTIONAL TESTS:  5 times sit to stand: 33 seconds Timed up and go (TUG): 23 seconds  TREATMENT DATE:  04/23/23 Nustep L 5 RT knee Act ext     -4    Pass 0 RT knee ext MMT 3+ with pain HS curls RLE green 2x10 RLE LAQ 2lb 2x10-visible shaking Standing RT LE hip flex,flex with ER,abd,ext 10 x 2 # S2S x10 ADD ball squeeze 20 x STM to R knee and surrounding structures-very tender and tight lateral knee and distal IT,also tender medial HS incertion  R knee patellar mobs  R knee PROM   04/21/23 NuStep L5 x 6 min MT  STM to R knee and surrounding structures  R knee patellar mobs  R knee PROM HS curls RLE green 2x10 RLE 2lb 2x10  RLE LAQ 2lb 2x10 S2S x10, x7 ADD ball squeeze 20 x   04/16/23 Nustep L 5 RT knee Active ext sitting -5 with pain MMT RT knee ext 4- with pain and HS 4/5 STW to medial and lateral knee, jt mobs left knee for jt capsule stretching HS Curls green 2x10 LAQ 2# 2 x 10 2# hip flex and abd 2 sets 10 ADD ball squeeze 20 x Ionto RT medial knee 4 hour patch 1.2 cc dex   04/14/23 NuStep L 5 x 8 min Sit to stands 2x10  LAQ 2lb 2x10 Seated March 2lb 2x10 HS Curls green 2x10 4in step ups HHA x 1 x 10 each  04/09/23 NuStep L 5 x 8 min STM semi tendinosis and insertion pint muscles PROM to bilat LE Hamstring, piriformis, K2x RLE LAQ 2x10 RLE HS curls yellow 2x10 RLE hip flex Hip add ball squeeze  S2S x5   04/07/23 NuStep L 5 x 6 min HS Curls 20lb  2x12  Leg Ext 10lb 2x12 6in step ups from airex w/ HHA x2  S2S x5, S2S on airex 2x5  Hip abd green 2x15 Lateral Step ups 4in HHA x1  04/02/23 NuStep L5x56mins  Calf raises 2x10  Calf stretch 30s  HS Curls 20lb 2x10  Leg Ext 10lb 2x10 Walking on beam  4 in step ups    03/26/23 NuStep L 5 x 6 min GOALS S2S holding 3lb dumbbell 4in step ups 2x5 Heel raises 2x10 HS Curls 20lb 2x10  Leg Ext 5lb 2x10, 10lb x10 Rows green 2x15 Shoulder Ext green 2x15  03/24/23 NuStep L 4 x 6 min Resisted gait forward backward 20lb x3 4in lateral step ups x10 HHAx1 S2S 2x10 HS Curls 20lb 2x10  Leg Ext 5lb 2x10 MT  STM to R kne and surrounding streutures  R knee patellar mobs  R knee PROM  03/19/23 NuStep L 4 x 6 min MT  STM to R kne and surrounding streutures  R knee patellar mobs  R knee PROM LAQ RLE 2lb 2x10 HS curls RLE  yellow 2x10 S2S 2x5 each 4in step ups 2x5 each Slant board calf stretch   03/17/23 NuStep L 4 x 6 min MT  STM to R kne and surrounding streutures  R knee patellar mobs  R knee PROM LAQ RLE 2lb x10, x5 HS curls RLE  yellow x10, x5  Hip add blue ball 2x10 Hip abd red bald 2x10  03/11/23: Eval    PATIENT EDUCATION:  Education details: POC and HEP Person educated: Patient Education method: Explanation Education comprehension: verbalized understanding  HOME EXERCISE PROGRAM: Standing Knee flexion at the counter, 2x10 Standing Marches at the counter, 2x10 Seated LAQ, 2x10  ASSESSMENT:  CLINICAL IMPRESSION: Pt arrives stating knee is stronger and hurts less with ext esp  when trying to straighten in bed. Stated 1 buckle since last session,seems usually with turns so educ on picking up feet vs pivoting on planted foot. Assessed and documented goals. OBJECTIVE IMPAIRMENTS: Abnormal gait, cardiopulmonary status limiting activity, decreased balance, decreased coordination, decreased endurance, difficulty walking, decreased ROM, decreased strength, and pain.    ACTIVITY LIMITATIONS: carrying, lifting, bending, sitting, standing, squatting, stairs, transfers, bed mobility, and continence  PARTICIPATION LIMITATIONS: cleaning, laundry, and community activity  PERSONAL FACTORS: Age, Fitness, and Time since onset of injury/illness/exacerbation are also affecting patient's functional outcome.   REHAB POTENTIAL: Good  CLINICAL DECISION MAKING: Stable/uncomplicated  EVALUATION COMPLEXITY: Low   GOALS: Goals reviewed with patient? Yes  SHORT TERM GOALS: Target date: 04/15/23 Patient will be independent with initial home program.  Baseline: Goal status: Met 03/24/23  2.  Pt will improve 5x STS to 25 seconds. Baseline: 33s Goal status: Met 17.3 sec 03/27/23  LONG TERM GOALS: Target date: 05/20/23  Pt will be independent with long term HEP for carryover and improvement of ADL difficulty. Baseline:  Goal status: progressing 04/16/23  evolving 04/23/23  2.  Pt will improve 5x STS to 20 seconds. Baseline: 33 sec (03/11/23) Goal status: Ongoing 03/26/23   04/16/23  18.71 MET  3.  Pt will improve TUG to 17 sec Baseline: 23 sec (03/11/23) Goal status: Met 15.62 03/26/23  4. Pt will improve knee strength to 4/5 or better Baseline: 3+/5 with pain Goal status: 04/16/23 met for HS  2/04/03/23 still limited and painful with ext  5. Patient will report decrease in pain levels to <2/10 or better  Baseline: 4-5/10  Goal status: ongoing 04/23/23  6. Patient will achieve active terminal knee extension to 0 degrees  Baseline: -5 active extension   Goal status: 04/23/23   -4 act    PLAN:  PT FREQUENCY: 2x/week  PT DURATION: 10 weeks  PLANNED INTERVENTIONS: 97110-Therapeutic exercises, 97530- Therapeutic activity, 97112- Neuromuscular re-education, 97535- Self Care, and 16109- Manual therapy  PLAN FOR NEXT SESSION: decrease pain, strengthen quads and hamstrings, gait training   Damin Salido,ANGIE, PTA [facility] at  [facility] [address] [place], [place], [address] Phone: [phone]   Fax:  [phone]  Patient Details  Name: Brooke Jimenez MRN: [id number] Date of Birth: [birth year] Referring Provider:  Laqueta Due., MD  Encounter Date: 04/23/2023   Suanne Marker, PTA 04/23/2023, 4:16 PM  [facility] at [facility] [address] [place], [place], [address] Phone: [phone]   Fax:  [phone]

## 2023-04-28 ENCOUNTER — Encounter: Payer: Self-pay | Admitting: Cardiovascular Disease

## 2023-04-28 ENCOUNTER — Encounter: Payer: Self-pay | Admitting: Physical Therapy

## 2023-04-28 ENCOUNTER — Ambulatory Visit: Payer: Medicare Other | Attending: Internal Medicine | Admitting: Physical Therapy

## 2023-04-28 DIAGNOSIS — R600 Localized edema: Secondary | ICD-10-CM

## 2023-04-28 DIAGNOSIS — G8929 Other chronic pain: Secondary | ICD-10-CM

## 2023-04-28 DIAGNOSIS — M542 Cervicalgia: Secondary | ICD-10-CM | POA: Diagnosis present

## 2023-04-28 DIAGNOSIS — R262 Difficulty in walking, not elsewhere classified: Secondary | ICD-10-CM

## 2023-04-28 DIAGNOSIS — M545 Low back pain, unspecified: Secondary | ICD-10-CM | POA: Insufficient documentation

## 2023-04-28 DIAGNOSIS — M25562 Pain in left knee: Secondary | ICD-10-CM | POA: Insufficient documentation

## 2023-04-28 DIAGNOSIS — M25552 Pain in left hip: Secondary | ICD-10-CM | POA: Insufficient documentation

## 2023-04-28 DIAGNOSIS — M6281 Muscle weakness (generalized): Secondary | ICD-10-CM | POA: Diagnosis present

## 2023-04-28 NOTE — Therapy (Signed)
 OUTPATIENT PHYSICAL THERAPY LOWER EXTREMITY TREATMENT     Patient Name: Brooke Jimenez MRN: [id number] DOB:[birth year], 78 y.o., female Today's Date: 04/28/2023  END OF SESSION:  PT End of Session - 04/28/23 1429     Visit Number 13    Date for PT Re-Evaluation 05/20/23    PT Start Time 1430    PT Stop Time 1515    PT Time Calculation (min) 45 min    Activity Tolerance Patient tolerated treatment well    Behavior During Therapy WFL for tasks assessed/performed              Past Medical History:  Diagnosis Date   Acne rosacea    Anxiety    Arthritis    arthritis in hip    Bronchiectasis (HCC)    Cataract    beginning   Complication of anesthesia    "really sore throat"   Corneal dystrophy    DDD (degenerative disc disease), cervical    and lower back   Diverticulosis    Dysphagia    chronic- please see ultrasound done 4/16 15 in [facility]    Dysrhythmia    sometimes patient has extra beats per patient    Esophageal dysmotility    Family history of adverse reaction to anesthesia    mother slow to wake up    GERD (gastroesophageal reflux disease)    under control   Glaucoma    "suspect"   Gout    one finger x1 attack   H/O hiatal hernia    Hearing loss    bilateral due to nerve loss. wears hearing aides   Heart murmur    no problem    History of chicken pox    History of melanoma    melanoma- 1986 and 1997    Hypothyroidism    Low back pain with sciatica    Measles    hx of   Mitral valve prolapse    Nocturia    Osteoporosis    Peripheral neuropathy    feet   PONV (postoperative nausea and vomiting)    PTSD (post-traumatic stress disorder) 2006   RSD (reflex sympathetic dystrophy) 1993   Spider veins    Urinary incontinence    occasional   Varicose veins    Past Surgical History:  Procedure Laterality Date   ESOPHAGEAL MANOMETRY N/A 12/17/2015   Procedure: ESOPHAGEAL MANOMETRY (EM);  Surgeon: Napoleon Form, MD;  Location: [facility];   Service: Endoscopy;  Laterality: N/A;   HARDWARE REMOVAL Left 10/17/2013   Procedure: HARDWARE REMOVAL LEFT HIP;  Surgeon: Loanne Drilling, MD;  Location: [facility];  Service: Orthopedics;  Laterality: Left;   HIP FRACTURE SURGERY Left 2006   3 screws   INSERTION OF MESH  02/08/2014   Procedure: INSERTION OF MESH;  Surgeon: Avel Peace, MD;  Location: [facility];  Service: General;;   melanoma surgery   1986, 1997   PARATHYROIDECTOMY  04/02/15   PH IMPEDANCE STUDY N/A 12/17/2015   Procedure: PH IMPEDANCE STUDY;  Surgeon: Napoleon Form, MD;  Location: [facility];  Service: Endoscopy;  Laterality: N/A;   TOTAL HIP ARTHROPLASTY Left 04/10/2014   Procedure: LEFT TOTAL HIP ARTHROPLASTY ANTERIOR APPROACH;  Surgeon: Loanne Drilling, MD;  Location: [facility];  Service: Orthopedics;  Laterality: Left;   UMBILICAL HERNIA REPAIR N/A 02/08/2014   Procedure: HERNIA REPAIR UMBILICAL ADULT WITH MESH;  Surgeon: Avel Peace, MD;  Location: [facility];  Service: General;  Laterality: N/A;   Patient  Active Problem List   Diagnosis Date Noted   Reactive monocytosis 11/29/2021   Rosacea 11/29/2021   Upper respiratory infection 01/15/2018   Postherpetic neuralgia 12/25/2016   Dysesthesia 12/25/2016   Polyneuropathy 12/25/2016   Exposure to influenza 04/01/2016   Dysphagia    Bronchiectasis without acute exacerbation (HCC) 08/30/2015   Bronchiectasis (HCC) 08/14/2015   Chronic rhinitis 08/14/2015   Avascular necrosis of femur head, left (HCC) 04/10/2014   OA (osteoarthritis) of hip 04/10/2014   Hypothyroidism 12/09/2013   Painful orthopaedic hardware (HCC) 10/17/2013    PCP: Dr. Ninetta Lights  REFERRING PROVIDER: Dr. Ollen Gross  REFERRING DIAG: Right knee pain with pes bursitis and chondromalacia patella  THERAPY DIAG:  Chronic pain of left knee  Muscle weakness (generalized)  Difficulty in walking, not elsewhere classified  Localized edema  Rationale for Evaluation and Treatment:  Rehabilitation  ONSET DATE: April 2024  SUBJECTIVE:   SUBJECTIVE STATEMENT: "Im tired" Overall the knee is buckling less.  PERTINENT HISTORY: Hip osteoarthritis Avascular necrosis of left femoral head DDD Dysrhythmia Gout Hearing loss LBP with Sciatica Osteoporosis Peripheral neuropathy (feet) Reflex Sympathetic Dystrophy PAIN:  Are you having pain? Yes: NPRS scale: 0/10 sporadic Pain location: medial and lateral posterior of R knee Pain description: sharp Aggravating factors: bent for long time, steps, walking for a long period of time, standing for long periods, general movement Relieving factors: medication  PRECAUTIONS: Fall  RED FLAGS: None   WEIGHT BEARING RESTRICTIONS: No  FALLS:  Has patient fallen in last 6 months? Yes. Number of falls 1  LIVING ENVIRONMENT: Lives with: lives with their family and lives with their spouse Lives in: House/apartment Stairs: Yes: Internal: 1 flight steps; on right going up and External: 3 steps; on right going up Has following equipment at home: Single point cane, Walker - 2 wheeled, and Environmental consultant - 4 wheeled  OCCUPATION: retired  PLOF: Independent and Independent with basic ADLs  PATIENT GOALS: walk normally, gain confidence with gait  NEXT MD VISIT: none set   OBJECTIVE:  Note: Objective measures were completed at Evaluation unless otherwise noted.  DIAGNOSTIC FINDINGS: cartilage loss ine right knee  PATIENT SURVEYS:  FOTO 43  COGNITION: Overall cognitive status: Within functional limits for tasks assessed      POSTURE: rounded shoulders  PALPATION: Medial and Lateral right knee TTP  LOWER EXTREMITY ROM:  Active ROM Right eval Left eval  Hip flexion [facility]  Hip extension    Hip abduction    Hip adduction    Hip internal rotation    Hip external rotation    Knee flexion WFL* WFL  Knee extension Lack 25* WFL  Ankle dorsiflexion [facility]  Ankle plantarflexion [facility] WFL  Ankle inversion    Ankle  eversion     (Blank rows = not tested, * = pain)  LOWER EXTREMITY MMT:  MMT Right eval Left eval  Hip flexion 4/5 4/5  Hip extension    Hip abduction 4/5 4/5  Hip adduction 4/5 4/5  Hip internal rotation    Hip external rotation    Knee flexion 3+/5 4-/5  Knee extension 3+/5* 4-/5  Ankle dorsiflexion    Ankle plantarflexion    Ankle inversion    Ankle eversion     (Blank rows = not tested, * = pain   FUNCTIONAL TESTS:  5 times sit to stand: 33 seconds Timed up and go (TUG): 23 seconds  TREATMENT DATE:  04/28/23 STM to R knee and surrounding structures  R knee patellar mobs  R knee PROM NuStep L45 x 6 min Gait outdoors out the front door, around the back building up and down slopes, 4 total stand ing rest breaks to complete due to pulmonary limitations. PT ambulated wit her R and lightly grasping the inner L elbow of therapist LAQ 2lb 2x10  04/23/23 Nustep L 5 RT knee Act ext     -4    Pass 0 RT knee ext MMT 3+ with pain HS curls RLE green 2x10 RLE LAQ 2lb 2x10-visible shaking Standing RT LE hip flex,flex with ER,abd,ext 10 x 2 # S2S x10 ADD ball squeeze 20 x STM to R knee and surrounding structures-very tender and tight lateral knee and distal IT,also tender medial HS incertion  R knee patellar mobs  R knee PROM   04/21/23 NuStep L5 x 6 min MT  STM to R knee and surrounding structures  R knee patellar mobs  R knee PROM HS curls RLE green 2x10 RLE 2lb 2x10  RLE LAQ 2lb 2x10 S2S x10, x7 ADD ball squeeze 20 x   04/16/23 Nustep L 5 RT knee Active ext sitting -5 with pain MMT RT knee ext 4- with pain and HS 4/5 STW to medial and lateral knee, jt mobs left knee for jt capsule stretching HS Curls green 2x10 LAQ 2# 2 x 10 2# hip flex and abd 2 sets 10 ADD ball squeeze 20 x Ionto RT medial knee 4 hour patch 1.2 cc  dex   04/14/23 NuStep L 5 x 8 min Sit to stands 2x10  LAQ 2lb 2x10 Seated March 2lb 2x10 HS Curls green 2x10 4in step ups HHA x 1 x 10 each  04/09/23 NuStep L 5 x 8 min STM semi tendinosis and insertion pint muscles PROM to bilat LE Hamstring, piriformis, K2x RLE LAQ 2x10 RLE HS curls yellow 2x10 RLE hip flex Hip add ball squeeze  S2S x5   04/07/23 NuStep L 5 x 6 min HS Curls 20lb 2x12  Leg Ext 10lb 2x12 6in step ups from airex w/ HHA x2  S2S x5, S2S on airex 2x5  Hip abd green 2x15 Lateral Step ups 4in HHA x1  04/02/23 NuStep L5x40mins  Calf raises 2x10  Calf stretch 30s  HS Curls 20lb 2x10  Leg Ext 10lb 2x10 Walking on beam  4 in step ups    03/26/23 NuStep L 5 x 6 min GOALS S2S holding 3lb dumbbell 4in step ups 2x5 Heel raises 2x10 HS Curls 20lb 2x10  Leg Ext 5lb 2x10, 10lb x10 Rows green 2x15 Shoulder Ext green 2x15  03/24/23 NuStep L 4 x 6 min Resisted gait forward backward 20lb x3 4in lateral step ups x10 HHAx1 S2S 2x10 HS Curls 20lb 2x10  Leg Ext 5lb 2x10 MT  STM to R kne and surrounding streutures  R knee patellar mobs  R knee PROM  03/19/23 NuStep L 4 x 6 min MT  STM to R kne and surrounding streutures  R knee patellar mobs  R knee PROM LAQ RLE 2lb 2x10 HS curls RLE  yellow 2x10 S2S 2x5 each 4in step ups 2x5 each Slant board calf stretch   03/17/23 NuStep L 4 x 6 min MT  STM to R kne and surrounding streutures  R knee patellar mobs  R knee PROM LAQ RLE 2lb x10, x5 HS curls RLE  yellow x10, x5  Hip add blue ball 2x10  Hip abd red bald 2x10  03/11/23: Eval    PATIENT EDUCATION:  Education details: POC and HEP Person educated: Patient Education method: Explanation Education comprehension: verbalized understanding  HOME EXERCISE PROGRAM: Standing Knee flexion at the counter, 2x10 Standing Marches at the counter, 2x10 Seated LAQ, 2x10  ASSESSMENT:  CLINICAL IMPRESSION: Pt enters doing well despite fatigue. She reports  that's her R knee has been buckling less overall. Progressed to outdoor ambulation. To pt request she wanted to walk around the back building to assess her abilities from a few years ago. She did have pulmonary limitations requiring increase time and multiple rest breaks.  .  OBJECTIVE IMPAIRMENTS: Abnormal gait, cardiopulmonary status limiting activity, decreased balance, decreased coordination, decreased endurance, difficulty walking, decreased ROM, decreased strength, and pain.   ACTIVITY LIMITATIONS: carrying, lifting, bending, sitting, standing, squatting, stairs, transfers, bed mobility, and continence  PARTICIPATION LIMITATIONS: cleaning, laundry, and community activity  PERSONAL FACTORS: Age, Fitness, and Time since onset of injury/illness/exacerbation are also affecting patient's functional outcome.   REHAB POTENTIAL: Good  CLINICAL DECISION MAKING: Stable/uncomplicated  EVALUATION COMPLEXITY: Low   GOALS: Goals reviewed with patient? Yes  SHORT TERM GOALS: Target date: 04/15/23 Patient will be independent with initial home program.  Baseline: Goal status: Met 03/24/23  2.  Pt will improve 5x STS to 25 seconds. Baseline: 33s Goal status: Met 17.3 sec 03/27/23  LONG TERM GOALS: Target date: 05/20/23  Pt will be independent with long term HEP for carryover and improvement of ADL difficulty. Baseline:  Goal status: progressing 04/16/23  evolving 04/23/23  2.  Pt will improve 5x STS to 20 seconds. Baseline: 33 sec (03/11/23) Goal status: Ongoing 03/26/23   04/16/23  18.71 MET  3.  Pt will improve TUG to 17 sec Baseline: 23 sec (03/11/23) Goal status: Met 15.62 03/26/23  4. Pt will improve knee strength to 4/5 or better Baseline: 3+/5 with pain Goal status: 04/16/23 met for HS  2/04/03/23 still limited and painful with ext  5. Patient will report decrease in pain levels to <2/10 or better  Baseline: 4-5/10  Goal status: ongoing 04/23/23  6. Patient will achieve active  terminal knee extension to 0 degrees  Baseline: -5 active extension   Goal status: 04/23/23   -4 act    PLAN:  PT FREQUENCY: 2x/week  PT DURATION: 10 weeks  PLANNED INTERVENTIONS: 97110-Therapeutic exercises, 97530- Therapeutic activity, 97112- Neuromuscular re-education, 97535- Self Care, and 16109- Manual therapy  PLAN FOR NEXT SESSION: decrease pain, strengthen quads and hamstrings, gait training   Grayce Sessions, PTA

## 2023-04-30 ENCOUNTER — Encounter: Payer: Self-pay | Admitting: Physical Therapy

## 2023-04-30 ENCOUNTER — Ambulatory Visit: Payer: Medicare Other | Admitting: Physical Therapy

## 2023-04-30 DIAGNOSIS — R600 Localized edema: Secondary | ICD-10-CM

## 2023-04-30 DIAGNOSIS — R262 Difficulty in walking, not elsewhere classified: Secondary | ICD-10-CM

## 2023-04-30 DIAGNOSIS — M6281 Muscle weakness (generalized): Secondary | ICD-10-CM

## 2023-04-30 DIAGNOSIS — G8929 Other chronic pain: Secondary | ICD-10-CM

## 2023-04-30 DIAGNOSIS — M25562 Pain in left knee: Secondary | ICD-10-CM | POA: Diagnosis not present

## 2023-04-30 NOTE — Therapy (Signed)
 OUTPATIENT PHYSICAL THERAPY LOWER EXTREMITY TREATMENT     Patient Name: Brooke Jimenez MRN: [id number] DOB:1945/06/15, 78 y.o., female Today's Date: 04/30/2023  END OF SESSION:  PT End of Session - 04/30/23 1521     Visit Number 14    Date for PT Re-Evaluation 05/20/23    PT Start Time 1522    PT Stop Time 1600    PT Time Calculation (min) 38 min    Activity Tolerance Patient tolerated treatment well    Behavior During Therapy WFL for tasks assessed/performed              Past Medical History:  Diagnosis Date   Acne rosacea    Anxiety    Arthritis    arthritis in hip    Bronchiectasis (HCC)    Cataract    beginning   Complication of anesthesia    "really sore throat"   Corneal dystrophy    DDD (degenerative disc disease), cervical    and lower back   Diverticulosis    Dysphagia    chronic- please see ultrasound done 4/16 15 in [facility]    Dysrhythmia    sometimes patient has extra beats per patient    Esophageal dysmotility    Family history of adverse reaction to anesthesia    mother slow to wake up    GERD (gastroesophageal reflux disease)    under control   Glaucoma    "suspect"   Gout    one finger x1 attack   H/O hiatal hernia    Hearing loss    bilateral due to nerve loss. wears hearing aides   Heart murmur    no problem    History of chicken pox    History of melanoma    melanoma- 1986 and 1997    Hypothyroidism    Low back pain with sciatica    Measles    hx of   Mitral valve prolapse    Nocturia    Osteoporosis    Peripheral neuropathy    feet   PONV (postoperative nausea and vomiting)    PTSD (post-traumatic stress disorder) 2006   RSD (reflex sympathetic dystrophy) 1993   Spider veins    Urinary incontinence    occasional   Varicose veins    Past Surgical History:  Procedure Laterality Date   ESOPHAGEAL MANOMETRY N/A 12/17/2015   Procedure: ESOPHAGEAL MANOMETRY (EM);  Surgeon: Napoleon Form, MD;  Location: [facility];   Service: Endoscopy;  Laterality: N/A;   HARDWARE REMOVAL Left 10/17/2013   Procedure: HARDWARE REMOVAL LEFT HIP;  Surgeon: Loanne Drilling, MD;  Location: [facility];  Service: Orthopedics;  Laterality: Left;   HIP FRACTURE SURGERY Left 2006   3 screws   INSERTION OF MESH  02/08/2014   Procedure: INSERTION OF MESH;  Surgeon: Avel Peace, MD;  Location: [facility];  Service: General;;   melanoma surgery   1986, 1997   PARATHYROIDECTOMY  04/02/15   PH IMPEDANCE STUDY N/A 12/17/2015   Procedure: PH IMPEDANCE STUDY;  Surgeon: Napoleon Form, MD;  Location: [facility];  Service: Endoscopy;  Laterality: N/A;   TOTAL HIP ARTHROPLASTY Left 04/10/2014   Procedure: LEFT TOTAL HIP ARTHROPLASTY ANTERIOR APPROACH;  Surgeon: Loanne Drilling, MD;  Location: [facility];  Service: Orthopedics;  Laterality: Left;   UMBILICAL HERNIA REPAIR N/A 02/08/2014   Procedure: HERNIA REPAIR UMBILICAL ADULT WITH MESH;  Surgeon: Avel Peace, MD;  Location: [facility];  Service: General;  Laterality: N/A;   Patient  Active Problem List   Diagnosis Date Noted   Reactive monocytosis 11/29/2021   Rosacea 11/29/2021   Upper respiratory infection 01/15/2018   Postherpetic neuralgia 12/25/2016   Dysesthesia 12/25/2016   Polyneuropathy 12/25/2016   Exposure to influenza 04/01/2016   Dysphagia    Bronchiectasis without acute exacerbation (HCC) 08/30/2015   Bronchiectasis (HCC) 08/14/2015   Chronic rhinitis 08/14/2015   Avascular necrosis of femur head, left (HCC) 04/10/2014   OA (osteoarthritis) of hip 04/10/2014   Hypothyroidism 12/09/2013   Painful orthopaedic hardware (HCC) 10/17/2013    PCP: Dr. Ninetta Lights  REFERRING PROVIDER: Dr. Ollen Gross  REFERRING DIAG: Right knee pain with pes bursitis and chondromalacia patella  THERAPY DIAG:  Chronic pain of left knee  Muscle weakness (generalized)  Difficulty in walking, not elsewhere classified  Localized edema  Rationale for Evaluation and Treatment:  Rehabilitation  ONSET DATE: April 2024  SUBJECTIVE:   SUBJECTIVE STATEMENT: "Im good"  PERTINENT HISTORY: Hip osteoarthritis Avascular necrosis of left femoral head DDD Dysrhythmia Gout Hearing loss LBP with Sciatica Osteoporosis Peripheral neuropathy (feet) Reflex Sympathetic Dystrophy PAIN:  Are you having pain? Yes: NPRS scale: 0/10 sporadic Pain location: medial and lateral posterior of R knee Pain description: sharp Aggravating factors: bent for long time, steps, walking for a long period of time, standing for long periods, general movement Relieving factors: medication  PRECAUTIONS: Fall  RED FLAGS: None   WEIGHT BEARING RESTRICTIONS: No  FALLS:  Has patient fallen in last 6 months? Yes. Number of falls 1  LIVING ENVIRONMENT: Lives with: lives with their family and lives with their spouse Lives in: House/apartment Stairs: Yes: Internal: 1 flight steps; on right going up and External: 3 steps; on right going up Has following equipment at home: Single point cane, Walker - 2 wheeled, and Environmental consultant - 4 wheeled  OCCUPATION: retired  PLOF: Independent and Independent with basic ADLs  PATIENT GOALS: walk normally, gain confidence with gait  NEXT MD VISIT: none set   OBJECTIVE:  Note: Objective measures were completed at Evaluation unless otherwise noted.  DIAGNOSTIC FINDINGS: cartilage loss ine right knee  PATIENT SURVEYS:  FOTO 43  COGNITION: Overall cognitive status: Within functional limits for tasks assessed      POSTURE: rounded shoulders  PALPATION: Medial and Lateral right knee TTP  LOWER EXTREMITY ROM:  Active ROM Right eval Left eval  Hip flexion [facility]  Hip extension    Hip abduction    Hip adduction    Hip internal rotation    Hip external rotation    Knee flexion WFL* WFL  Knee extension Lack 25* WFL  Ankle dorsiflexion [facility]  Ankle plantarflexion [facility] WFL  Ankle inversion    Ankle eversion     (Blank rows = not tested, *  = pain)  LOWER EXTREMITY MMT:  MMT Right eval Left eval  Hip flexion 4/5 4/5  Hip extension    Hip abduction 4/5 4/5  Hip adduction 4/5 4/5  Hip internal rotation    Hip external rotation    Knee flexion 3+/5 4-/5  Knee extension 3+/5* 4-/5  Ankle dorsiflexion    Ankle plantarflexion    Ankle inversion    Ankle eversion     (Blank rows = not tested, * = pain   FUNCTIONAL TESTS:  5 times sit to stand: 33 seconds Timed up and go (TUG): 23 seconds  TREATMENT DATE:  04/30/23 NuStep L 5 x 6 min GOALS MMT Knee   R Flex 4+/5, Ext 5/5  L Flex 5/5, Ext 5/5 Sit to stands x10, x5, x5 4in step 2x5 each  STM to R knee and surrounding structures  R knee patellar mobs  04/28/23 STM to R knee and surrounding structures  R knee patellar mobs  R knee PROM NuStep L45 x 6 min Gait outdoors out the front door, around the back building up and down slopes, 4 total stand ing rest breaks to complete due to pulmonary limitations. PT ambulated wit her R and lightly grasping the inner L elbow of therapist LAQ 2lb 2x10  04/23/23 Nustep L 5 RT knee Act ext     -4    Pass 0 RT knee ext MMT 3+ with pain HS curls RLE green 2x10 RLE LAQ 2lb 2x10-visible shaking Standing RT LE hip flex,flex with ER,abd,ext 10 x 2 # S2S x10 ADD ball squeeze 20 x STM to R knee and surrounding structures-very tender and tight lateral knee and distal IT,also tender medial HS incertion  R knee patellar mobs  R knee PROM   04/21/23 NuStep L5 x 6 min MT  STM to R knee and surrounding structures  R knee patellar mobs  R knee PROM HS curls RLE green 2x10 RLE 2lb 2x10  RLE LAQ 2lb 2x10 S2S x10, x7 ADD ball squeeze 20 x   04/16/23 Nustep L 5 RT knee Active ext sitting -5 with pain MMT RT knee ext 4- with pain and HS 4/5 STW to medial and lateral knee, jt mobs left knee for jt  capsule stretching HS Curls green 2x10 LAQ 2# 2 x 10 2# hip flex and abd 2 sets 10 ADD ball squeeze 20 x Ionto RT medial knee 4 hour patch 1.2 cc dex   04/14/23 NuStep L 5 x 8 min Sit to stands 2x10  LAQ 2lb 2x10 Seated March 2lb 2x10 HS Curls green 2x10 4in step ups HHA x 1 x 10 each  04/09/23 NuStep L 5 x 8 min STM semi tendinosis and insertion pint muscles PROM to bilat LE Hamstring, piriformis, K2x RLE LAQ 2x10 RLE HS curls yellow 2x10 RLE hip flex Hip add ball squeeze  S2S x5   04/07/23 NuStep L 5 x 6 min HS Curls 20lb 2x12  Leg Ext 10lb 2x12 6in step ups from airex w/ HHA x2  S2S x5, S2S on airex 2x5  Hip abd green 2x15 Lateral Step ups 4in HHA x1  04/02/23 NuStep L5x62mins  Calf raises 2x10  Calf stretch 30s  HS Curls 20lb 2x10  Leg Ext 10lb 2x10 Walking on beam  4 in step ups    03/26/23 NuStep L 5 x 6 min GOALS S2S holding 3lb dumbbell 4in step ups 2x5 Heel raises 2x10 HS Curls 20lb 2x10  Leg Ext 5lb 2x10, 10lb x10 Rows green 2x15 Shoulder Ext green 2x15  03/24/23 NuStep L 4 x 6 min Resisted gait forward backward 20lb x3 4in lateral step ups x10 HHAx1 S2S 2x10 HS Curls 20lb 2x10  Leg Ext 5lb 2x10 MT  STM to R kne and surrounding streutures  R knee patellar mobs  R knee PROM  03/19/23 NuStep L 4 x 6 min MT  STM to R kne and surrounding streutures  R knee patellar mobs  R knee PROM LAQ RLE 2lb 2x10 HS curls RLE  yellow 2x10 S2S 2x5 each 4in step ups 2x5 each Slant board  calf stretch   03/17/23 NuStep L 4 x 6 min MT  STM to R kne and surrounding streutures  R knee patellar mobs  R knee PROM LAQ RLE 2lb x10, x5 HS curls RLE  yellow x10, x5  Hip add blue ball 2x10 Hip abd red bald 2x10  03/11/23: Eval    PATIENT EDUCATION:  Education details: POC and HEP Person educated: Patient Education method: Explanation Education comprehension: verbalized understanding  HOME EXERCISE PROGRAM: Standing Knee flexion at the counter,  2x10 Standing Marches at the counter, 2x10 Seated LAQ, 2x10  ASSESSMENT:  CLINICAL IMPRESSION: Pt enters ~ 7 minuted late. She has progressed increasing her LE quad and HS strength. She continues to reports R knee pain and discomfort with activity but pain location is not consistent, pain also goes down into her R ankle.  She did have pulmonary limitations requiring increase time and multiple rest breaks.LE's did fatigue quick with sit to stands.   .  OBJECTIVE IMPAIRMENTS: Abnormal gait, cardiopulmonary status limiting activity, decreased balance, decreased coordination, decreased endurance, difficulty walking, decreased ROM, decreased strength, and pain.   ACTIVITY LIMITATIONS: carrying, lifting, bending, sitting, standing, squatting, stairs, transfers, bed mobility, and continence  PARTICIPATION LIMITATIONS: cleaning, laundry, and community activity  PERSONAL FACTORS: Age, Fitness, and Time since onset of injury/illness/exacerbation are also affecting patient's functional outcome.   REHAB POTENTIAL: Good  CLINICAL DECISION MAKING: Stable/uncomplicated  EVALUATION COMPLEXITY: Low   GOALS: Goals reviewed with patient? Yes  SHORT TERM GOALS: Target date: 04/15/23 Patient will be independent with initial home program.  Baseline: Goal status: Met 03/24/23  2.  Pt will improve 5x STS to 25 seconds. Baseline: 33s Goal status: Met 17.3 sec 03/27/23  LONG TERM GOALS: Target date: 05/20/23  Pt will be independent with long term HEP for carryover and improvement of ADL difficulty. Baseline:  Goal status: progressing 04/16/23  evolving 04/23/23  2.  Pt will improve 5x STS to 20 seconds. Baseline: 33 sec (03/11/23) Goal status: Ongoing 03/26/23   04/16/23  18.71 MET  3.  Pt will improve TUG to 17 sec Baseline: 23 sec (03/11/23) Goal status: Met 15.62 03/26/23  4. Pt will improve knee strength to 4/5 or better Baseline: 3+/5 with pain Goal status: 04/16/23 met for HS  2/04/03/23 still  limited and painful with ext, 04/30/23 Met   5. Patient will report decrease in pain levels to <2/10 or better  Baseline: 4-5/10  Goal status: ongoing 04/23/23, ongoing 5/10 04/30/23  6. Patient will achieve active terminal knee extension to 0 degrees  Baseline: -5 active extension   Goal status: 04/23/23   -4 act, 04/30/23 Met with LLE, R knee ext 2 degrees     PLAN:  PT FREQUENCY: 2x/week  PT DURATION: 10 weeks  PLANNED INTERVENTIONS: 97110-Therapeutic exercises, 97530- Therapeutic activity, 97112- Neuromuscular re-education, 97535- Self Care, and 16109- Manual therapy  PLAN FOR NEXT SESSION: decrease pain, strengthen quads and hamstrings, gait training   Grayce Sessions, PTA

## 2023-05-05 ENCOUNTER — Ambulatory Visit: Payer: Medicare Other | Admitting: Physical Therapy

## 2023-05-07 ENCOUNTER — Ambulatory Visit: Payer: Medicare Other | Admitting: Physical Therapy

## 2023-05-07 DIAGNOSIS — M25562 Pain in left knee: Secondary | ICD-10-CM | POA: Diagnosis not present

## 2023-05-07 DIAGNOSIS — M6281 Muscle weakness (generalized): Secondary | ICD-10-CM

## 2023-05-07 DIAGNOSIS — G8929 Other chronic pain: Secondary | ICD-10-CM

## 2023-05-07 DIAGNOSIS — R262 Difficulty in walking, not elsewhere classified: Secondary | ICD-10-CM

## 2023-05-07 NOTE — Therapy (Signed)
 OUTPATIENT PHYSICAL THERAPY LOWER EXTREMITY TREATMENT     Patient Name: Brooke Jimenez MRN: [id number] DOB:[birth year], 78 y.o., female Today's Date: 05/07/2023  END OF SESSION:  PT End of Session - 05/07/23 1525     Visit Number 15    Date for PT Re-Evaluation 05/20/23    PT Start Time 1525    PT Stop Time 1610    PT Time Calculation (min) 45 min              Past Medical History:  Diagnosis Date   Acne rosacea    Anxiety    Arthritis    arthritis in hip    Bronchiectasis (HCC)    Cataract    beginning   Complication of anesthesia    "really sore throat"   Corneal dystrophy    DDD (degenerative disc disease), cervical    and lower back   Diverticulosis    Dysphagia    chronic- please see ultrasound done 4/16 15 in [facility]    Dysrhythmia    sometimes patient has extra beats per patient    Esophageal dysmotility    Family history of adverse reaction to anesthesia    mother slow to wake up    GERD (gastroesophageal reflux disease)    under control   Glaucoma    "suspect"   Gout    one finger x1 attack   H/O hiatal hernia    Hearing loss    bilateral due to nerve loss. wears hearing aides   Heart murmur    no problem    History of chicken pox    History of melanoma    melanoma- 1986 and 1997    Hypothyroidism    Low back pain with sciatica    Measles    hx of   Mitral valve prolapse    Nocturia    Osteoporosis    Peripheral neuropathy    feet   PONV (postoperative nausea and vomiting)    PTSD (post-traumatic stress disorder) 2006   RSD (reflex sympathetic dystrophy) 1993   Spider veins    Urinary incontinence    occasional   Varicose veins    Past Surgical History:  Procedure Laterality Date   ESOPHAGEAL MANOMETRY N/A 12/17/2015   Procedure: ESOPHAGEAL MANOMETRY (EM);  Surgeon: Napoleon Form, MD;  Location: [facility];  Service: Endoscopy;  Laterality: N/A;   HARDWARE REMOVAL Left 10/17/2013   Procedure: HARDWARE REMOVAL LEFT HIP;   Surgeon: Loanne Drilling, MD;  Location: [facility];  Service: Orthopedics;  Laterality: Left;   HIP FRACTURE SURGERY Left 2006   3 screws   INSERTION OF MESH  02/08/2014   Procedure: INSERTION OF MESH;  Surgeon: Avel Peace, MD;  Location: [facility];  Service: General;;   melanoma surgery   1986, 1997   PARATHYROIDECTOMY  04/02/15   PH IMPEDANCE STUDY N/A 12/17/2015   Procedure: PH IMPEDANCE STUDY;  Surgeon: Napoleon Form, MD;  Location: [facility];  Service: Endoscopy;  Laterality: N/A;   TOTAL HIP ARTHROPLASTY Left 04/10/2014   Procedure: LEFT TOTAL HIP ARTHROPLASTY ANTERIOR APPROACH;  Surgeon: Loanne Drilling, MD;  Location: [facility];  Service: Orthopedics;  Laterality: Left;   UMBILICAL HERNIA REPAIR N/A 02/08/2014   Procedure: HERNIA REPAIR UMBILICAL ADULT WITH MESH;  Surgeon: Avel Peace, MD;  Location: [facility];  Service: General;  Laterality: N/A;   Patient Active Problem List   Diagnosis Date Noted   Reactive monocytosis 11/29/2021   Rosacea 11/29/2021  Upper respiratory infection 01/15/2018   Postherpetic neuralgia 12/25/2016   Dysesthesia 12/25/2016   Polyneuropathy 12/25/2016   Exposure to influenza 04/01/2016   Dysphagia    Bronchiectasis without acute exacerbation (HCC) 08/30/2015   Bronchiectasis (HCC) 08/14/2015   Chronic rhinitis 08/14/2015   Avascular necrosis of femur head, left (HCC) 04/10/2014   OA (osteoarthritis) of hip 04/10/2014   Hypothyroidism 12/09/2013   Painful orthopaedic hardware (HCC) 10/17/2013    PCP: Dr. Ninetta Lights  REFERRING PROVIDER: Dr. Ollen Gross  REFERRING DIAG: Right knee pain with pes bursitis and chondromalacia patella  THERAPY DIAG:  Chronic pain of left knee  Muscle weakness (generalized)  Difficulty in walking, not elsewhere classified  Rationale for Evaluation and Treatment: Rehabilitation  ONSET DATE: April 2024  SUBJECTIVE:   SUBJECTIVE STATEMENT: Yesterday tried to pull leg up to put on socks and a lot of  medial pain. Leg still feels like it wants to give away. NO MD follow up. " I know I need to get muscles stronger and balance better"  PERTINENT HISTORY: Hip osteoarthritis Avascular necrosis of left femoral head DDD Dysrhythmia Gout Hearing loss LBP with Sciatica Osteoporosis Peripheral neuropathy (feet) Reflex Sympathetic Dystrophy PAIN:  Are you having pain? Yes: NPRS scale: 0/10 sporadic Pain location: medial and lateral posterior of R knee Pain description: sharp Aggravating factors: bent for long time, steps, walking for a long period of time, standing for long periods, general movement Relieving factors: medication  PRECAUTIONS: Fall  RED FLAGS: None   WEIGHT BEARING RESTRICTIONS: No  FALLS:  Has patient fallen in last 6 months? Yes. Number of falls 1  LIVING ENVIRONMENT: Lives with: lives with their family and lives with their spouse Lives in: House/apartment Stairs: Yes: Internal: 1 flight steps; on right going up and External: 3 steps; on right going up Has following equipment at home: Single point cane, Walker - 2 wheeled, and Environmental consultant - 4 wheeled  OCCUPATION: retired  PLOF: Independent and Independent with basic ADLs  PATIENT GOALS: walk normally, gain confidence with gait  NEXT MD VISIT: none set   OBJECTIVE:  Note: Objective measures were completed at Evaluation unless otherwise noted.  DIAGNOSTIC FINDINGS: cartilage loss ine right knee  PATIENT SURVEYS:  FOTO 43  COGNITION: Overall cognitive status: Within functional limits for tasks assessed      POSTURE: rounded shoulders  PALPATION: Medial and Lateral right knee TTP  LOWER EXTREMITY ROM:  Active ROM Right eval Left eval  Hip flexion [facility]  Hip extension    Hip abduction    Hip adduction    Hip internal rotation    Hip external rotation    Knee flexion WFL* WFL  Knee extension Lack 25* WFL  Ankle dorsiflexion [facility]  Ankle plantarflexion [facility] WFL  Ankle inversion    Ankle  eversion     (Blank rows = not tested, * = pain)  LOWER EXTREMITY MMT:  MMT Right eval Left eval  Hip flexion 4/5 4/5  Hip extension    Hip abduction 4/5 4/5  Hip adduction 4/5 4/5  Hip internal rotation    Hip external rotation    Knee flexion 3+/5 4-/5  Knee extension 3+/5* 4-/5  Ankle dorsiflexion    Ankle plantarflexion    Ankle inversion    Ankle eversion     (Blank rows = not tested, * = pain   FUNCTIONAL TESTS:  5 times sit to stand: 33 seconds Timed up and go (TUG): 23 seconds  TREATMENT DATE:   05/07/23 Nustep L 5 6  min HS curl 2 sets 10 green tband BIL Red tband hip abd and hip flexion 2 sets 10 BIL Red tband LAQ 2 sets 10 BIL HHA 3# marching fwd and backward 15 feet 2 times each then side stepping STM to R knee and surrounding structures- tender and tight HS  Seated HS stretch- issued to do at home   04/30/23 NuStep L 5 x 6 min GOALS MMT Knee   R Flex 4+/5, Ext 5/5  L Flex 5/5, Ext 5/5 Sit to stands x10, x5, x5 4in step 2x5 each  STM to R knee and surrounding structures  R knee patellar mobs  04/28/23 STM to R knee and surrounding structures  R knee patellar mobs  R knee PROM NuStep L45 x 6 min Gait outdoors out the front door, around the back building up and down slopes, 4 total stand ing rest breaks to complete due to pulmonary limitations. PT ambulated wit her R and lightly grasping the inner L elbow of therapist LAQ 2lb 2x10  04/23/23 Nustep L 5 RT knee Act ext     -4    Pass 0 RT knee ext MMT 3+ with pain HS curls RLE green 2x10 RLE LAQ 2lb 2x10-visible shaking Standing RT LE hip flex,flex with ER,abd,ext 10 x 2 # S2S x10 ADD ball squeeze 20 x STM to R knee and surrounding structures-very tender and tight lateral knee and distal IT,also tender medial HS incertion  R knee patellar mobs  R knee  PROM   04/21/23 NuStep L5 x 6 min MT  STM to R knee and surrounding structures  R knee patellar mobs  R knee PROM HS curls RLE green 2x10 RLE 2lb 2x10  RLE LAQ 2lb 2x10 S2S x10, x7 ADD ball squeeze 20 x   04/16/23 Nustep L 5 RT knee Active ext sitting -5 with pain MMT RT knee ext 4- with pain and HS 4/5 STW to medial and lateral knee, jt mobs left knee for jt capsule stretching HS Curls green 2x10 LAQ 2# 2 x 10 2# hip flex and abd 2 sets 10 ADD ball squeeze 20 x Ionto RT medial knee 4 hour patch 1.2 cc dex   04/14/23 NuStep L 5 x 8 min Sit to stands 2x10  LAQ 2lb 2x10 Seated March 2lb 2x10 HS Curls green 2x10 4in step ups HHA x 1 x 10 each  04/09/23 NuStep L 5 x 8 min STM semi tendinosis and insertion pint muscles PROM to bilat LE Hamstring, piriformis, K2x RLE LAQ 2x10 RLE HS curls yellow 2x10 RLE hip flex Hip add ball squeeze  S2S x5   04/07/23 NuStep L 5 x 6 min HS Curls 20lb 2x12  Leg Ext 10lb 2x12 6in step ups from airex w/ HHA x2  S2S x5, S2S on airex 2x5  Hip abd green 2x15 Lateral Step ups 4in HHA x1  04/02/23 NuStep L5x36mins  Calf raises 2x10  Calf stretch 30s  HS Curls 20lb 2x10  Leg Ext 10lb 2x10 Walking on beam  4 in step ups    03/26/23 NuStep L 5 x 6 min GOALS S2S holding 3lb dumbbell 4in step ups 2x5 Heel raises 2x10 HS Curls 20lb 2x10  Leg Ext 5lb 2x10, 10lb x10 Rows green 2x15 Shoulder Ext green 2x15  03/24/23 NuStep L 4 x 6 min Resisted gait forward backward 20lb x3 4in lateral step ups x10 HHAx1 S2S 2x10 HS Curls 20lb  2x10  Leg Ext 5lb 2x10 MT  STM to R kne and surrounding streutures  R knee patellar mobs  R knee PROM  03/19/23 NuStep L 4 x 6 min MT  STM to R kne and surrounding streutures  R knee patellar mobs  R knee PROM LAQ RLE 2lb 2x10 HS curls RLE  yellow 2x10 S2S 2x5 each 4in step ups 2x5 each Slant board calf stretch   03/17/23 NuStep L 4 x 6 min MT  STM to R kne and surrounding streutures  R  knee patellar mobs  R knee PROM LAQ RLE 2lb x10, x5 HS curls RLE  yellow x10, x5  Hip add blue ball 2x10 Hip abd red bald 2x10  03/11/23: Eval    PATIENT EDUCATION:  Education details: POC and HEP Person educated: Patient Education method: Explanation Education comprehension: verbalized understanding  HOME EXERCISE PROGRAM: Standing Knee flexion at the counter, 2x10 Standing Marches at the counter, 2x10 Seated LAQ, 2x10  ASSESSMENT:  CLINICAL IMPRESSION: Pt states ,Yesterday tried to pull leg up to put on socks and a lot of medial pain. Leg still feels like it wants to give away. NO MD follow up. " I know I need to get muscles stronger and balance better", Pt states some days I think PT is helping and other days not sure. Focus session on strengthening .  OBJECTIVE IMPAIRMENTS: Abnormal gait, cardiopulmonary status limiting activity, decreased balance, decreased coordination, decreased endurance, difficulty walking, decreased ROM, decreased strength, and pain.   ACTIVITY LIMITATIONS: carrying, lifting, bending, sitting, standing, squatting, stairs, transfers, bed mobility, and continence  PARTICIPATION LIMITATIONS: cleaning, laundry, and community activity  PERSONAL FACTORS: Age, Fitness, and Time since onset of injury/illness/exacerbation are also affecting patient's functional outcome.   REHAB POTENTIAL: Good  CLINICAL DECISION MAKING: Stable/uncomplicated  EVALUATION COMPLEXITY: Low   GOALS: Goals reviewed with patient? Yes  SHORT TERM GOALS: Target date: 04/15/23 Patient will be independent with initial home program.  Baseline: Goal status: Met 03/24/23  2.  Pt will improve 5x STS to 25 seconds. Baseline: 33s Goal status: Met 17.3 sec 03/27/23  LONG TERM GOALS: Target date: 05/20/23  Pt will be independent with long term HEP for carryover and improvement of ADL difficulty. Baseline:  Goal status: progressing 04/16/23  evolving 04/23/23  2.  Pt will improve 5x  STS to 20 seconds. Baseline: 33 sec (03/11/23) Goal status: Ongoing 03/26/23   04/16/23  18.71 MET  3.  Pt will improve TUG to 17 sec Baseline: 23 sec (03/11/23) Goal status: Met 15.62 03/26/23  4. Pt will improve knee strength to 4/5 or better Baseline: 3+/5 with pain Goal status: 04/16/23 met for HS  2/04/03/23 still limited and painful with ext, 04/30/23 Met   5. Patient will report decrease in pain levels to <2/10 or better  Baseline: 4-5/10  Goal status: ongoing 04/23/23, ongoing 5/10 04/30/23  05/07/23 progressing  6. Patient will achieve active terminal knee extension to 0 degrees  Baseline: -5 active extension   Goal status: 04/23/23   -4 act, 04/30/23 Met with LLE, R knee ext 2 degrees     PLAN:  PT FREQUENCY: 2x/week  PT DURATION: 10 weeks  PLANNED INTERVENTIONS: 97110-Therapeutic exercises, 97530- Therapeutic activity, 97112- Neuromuscular re-education, 97535- Self Care, and 81191- Manual therapy  PLAN FOR NEXT SESSION: decrease pain, strengthen quads and hamstrings, gait training   Rachel Samples,ANGIE, PTA [facility] [address] [place], [place], [address] Phone: [phone]  Fax:  [phone]  Patient Details  Name: Latrish Mogel MRN: [id number] Date of Birth: 11-Dec-1945 Referring Provider:  Laqueta Due., MD  Encounter Date: 05/07/2023   Suanne Marker, PTA 05/07/2023, 3:25 PM  [place] [facility] [address] [place], [place], [address] Phone: [phone]   Fax:  [phone]

## 2023-05-12 ENCOUNTER — Encounter: Payer: Self-pay | Admitting: Physical Therapy

## 2023-05-12 ENCOUNTER — Ambulatory Visit: Payer: Medicare Other | Admitting: Physical Therapy

## 2023-05-12 DIAGNOSIS — M545 Low back pain, unspecified: Secondary | ICD-10-CM

## 2023-05-12 DIAGNOSIS — R262 Difficulty in walking, not elsewhere classified: Secondary | ICD-10-CM

## 2023-05-12 DIAGNOSIS — M542 Cervicalgia: Secondary | ICD-10-CM

## 2023-05-12 DIAGNOSIS — M25562 Pain in left knee: Secondary | ICD-10-CM | POA: Diagnosis not present

## 2023-05-12 DIAGNOSIS — M25552 Pain in left hip: Secondary | ICD-10-CM

## 2023-05-12 DIAGNOSIS — G8929 Other chronic pain: Secondary | ICD-10-CM

## 2023-05-12 DIAGNOSIS — M6281 Muscle weakness (generalized): Secondary | ICD-10-CM

## 2023-05-12 NOTE — Therapy (Signed)
 OUTPATIENT PHYSICAL THERAPY LOWER EXTREMITY TREATMENT     Patient Name: Brooke Jimenez MRN: [id number] DOB:1945/12/17, 78 y.o., female Today's Date: 05/12/2023  END OF SESSION:  PT End of Session - 05/12/23 1614     Visit Number 16    Date for PT Re-Evaluation 05/20/23    PT Start Time 1614    PT Stop Time 1700    PT Time Calculation (min) 46 min    Activity Tolerance Patient tolerated treatment well    Behavior During Therapy WFL for tasks assessed/performed              Past Medical History:  Diagnosis Date   Acne rosacea    Anxiety    Arthritis    arthritis in hip    Bronchiectasis (HCC)    Cataract    beginning   Complication of anesthesia    "really sore throat"   Corneal dystrophy    DDD (degenerative disc disease), cervical    and lower back   Diverticulosis    Dysphagia    chronic- please see ultrasound done 4/16 15 in [facility]    Dysrhythmia    sometimes patient has extra beats per patient    Esophageal dysmotility    Family history of adverse reaction to anesthesia    mother slow to wake up    GERD (gastroesophageal reflux disease)    under control   Glaucoma    "suspect"   Gout    one finger x1 attack   H/O hiatal hernia    Hearing loss    bilateral due to nerve loss. wears hearing aides   Heart murmur    no problem    History of chicken pox    History of melanoma    melanoma- 1986 and 1997    Hypothyroidism    Low back pain with sciatica    Measles    hx of   Mitral valve prolapse    Nocturia    Osteoporosis    Peripheral neuropathy    feet   PONV (postoperative nausea and vomiting)    PTSD (post-traumatic stress disorder) 2006   RSD (reflex sympathetic dystrophy) 1993   Spider veins    Urinary incontinence    occasional   Varicose veins    Past Surgical History:  Procedure Laterality Date   ESOPHAGEAL MANOMETRY N/A 12/17/2015   Procedure: ESOPHAGEAL MANOMETRY (EM);  Surgeon: Napoleon Form, MD;  [facility]: [facility];   Service: Endoscopy;  Laterality: N/A;   HARDWARE REMOVAL Left 10/17/2013   Procedure: HARDWARE REMOVAL LEFT HIP;  Surgeon: Loanne Drilling, MD;  Location: [facility];  Service: Orthopedics;  Laterality: Left;   HIP FRACTURE SURGERY Left 2006   3 screws   INSERTION OF MESH  02/08/2014   Procedure: INSERTION OF MESH;  Surgeon: Avel Peace, MD;  Location: [facility];  Service: General;;   melanoma surgery   1986, 1997   PARATHYROIDECTOMY  04/02/15   PH IMPEDANCE STUDY N/A 12/17/2015   Procedure: PH IMPEDANCE STUDY;  Surgeon: Napoleon Form, MD;  [facility];  Service: Endoscopy;  Laterality: N/A;   TOTAL HIP ARTHROPLASTY Left 04/10/2014   Procedure: LEFT TOTAL HIP ARTHROPLASTY ANTERIOR APPROACH;  Surgeon: Loanne Drilling, MD;  [facility];  Service: Orthopedics;  Laterality: Left;   UMBILICAL HERNIA REPAIR N/A 02/08/2014   Procedure: HERNIA REPAIR UMBILICAL ADULT WITH MESH;  Surgeon: Avel Peace, MD;  [facility];  Service: General;  Laterality: N/A;   Patient  Active Problem List   Diagnosis Date Noted   Reactive monocytosis 11/29/2021   Rosacea 11/29/2021   Upper respiratory infection 01/15/2018   Postherpetic neuralgia 12/25/2016   Dysesthesia 12/25/2016   Polyneuropathy 12/25/2016   Exposure to influenza 04/01/2016   Dysphagia    Bronchiectasis without acute exacerbation (HCC) 08/30/2015   Bronchiectasis (HCC) 08/14/2015   Chronic rhinitis 08/14/2015   Avascular necrosis of femur head, left (HCC) 04/10/2014   OA (osteoarthritis) of hip 04/10/2014   Hypothyroidism 12/09/2013   Painful orthopaedic hardware (HCC) 10/17/2013    PCP: Dr. Ninetta Lights  REFERRING PROVIDER: Dr. Ollen Gross  REFERRING DIAG: Right knee pain with pes bursitis and chondromalacia patella  THERAPY DIAG:  Chronic pain of left knee  Muscle weakness (generalized)  Difficulty in walking, not elsewhere classified  Chronic bilateral low back pain without sciatica  Pain in left  hip  Cervicalgia  Rationale for Evaluation and Treatment: Rehabilitation  ONSET DATE: April 2024  SUBJECTIVE:   SUBJECTIVE STATEMENT: Doing okay, still hurting in the left hip and the right lower leg 5/10  PERTINENT HISTORY: Hip osteoarthritis Avascular necrosis of left femoral head DDD Dysrhythmia Gout Hearing loss LBP with Sciatica Osteoporosis Peripheral neuropathy (feet) Reflex Sympathetic Dystrophy PAIN:  Are you having pain? Yes: NPRS scale: 0/10 sporadic Pain location: medial and lateral posterior of R knee Pain description: sharp Aggravating factors: bent for long time, steps, walking for a long period of time, standing for long periods, general movement Relieving factors: medication  PRECAUTIONS: Fall  RED FLAGS: None   WEIGHT BEARING RESTRICTIONS: No  FALLS:  Has patient fallen in last 6 months? Yes. Number of falls 1  LIVING ENVIRONMENT: Lives with: lives with their family and lives with their spouse Lives in: House/apartment Stairs: Yes: Internal: 1 flight steps; on right going up and External: 3 steps; on right going up Has following equipment at home: Single point cane, Walker - 2 wheeled, and Environmental consultant - 4 wheeled  OCCUPATION: retired  PLOF: Independent and Independent with basic ADLs  PATIENT GOALS: walk normally, gain confidence with gait  NEXT MD VISIT: none set   OBJECTIVE:  Note: Objective measures were completed at Evaluation unless otherwise noted.  DIAGNOSTIC FINDINGS: cartilage loss ine right knee  PATIENT SURVEYS:  FOTO 43  COGNITION: Overall cognitive status: Within functional limits for tasks assessed      POSTURE: rounded shoulders  PALPATION: Medial and Lateral right knee TTP  LOWER EXTREMITY ROM:  Active ROM Right eval Left eval  Hip flexion [facility]  Hip extension    Hip abduction    Hip adduction    Hip internal rotation    Hip external rotation    Knee flexion WFL* WFL  Knee extension Lack 25* WFL  Ankle  dorsiflexion [facility]  Ankle plantarflexion [facility] WFL  Ankle inversion    Ankle eversion     (Blank rows = not tested, * = pain)  LOWER EXTREMITY MMT:  MMT Right eval Left eval  Hip flexion 4/5 4/5  Hip extension    Hip abduction 4/5 4/5  Hip adduction 4/5 4/5  Hip internal rotation    Hip external rotation    Knee flexion 3+/5 4-/5  Knee extension 3+/5* 4-/5  Ankle dorsiflexion    Ankle plantarflexion    Ankle inversion    Ankle eversion     (Blank rows = not tested, * = pain   FUNCTIONAL TESTS:  5 times sit to stand: 33 seconds Timed up and  go (TUG): 23 seconds                                                                                                                               TREATMENT DATE:  05/12/23 Nustep level 5 x 6 minutes HS curls 15# 2x10 Feet on ball K2C, trunk rotation, small bridge, isometric abs Passive stretch LE's 3# hip abduction 3# hip extension Green tband clamshells  05/07/23 Nustep L 5 6  min HS curl 2 sets 10 green tband BIL Red tband hip abd and hip flexion 2 sets 10 BIL Red tband LAQ 2 sets 10 BIL HHA 3# marching fwd and backward 15 feet 2 times each then side stepping STM to R knee and surrounding structures- tender and tight HS  Seated HS stretch- issued to do at home   04/30/23 NuStep L 5 x 6 min GOALS MMT Knee   R Flex 4+/5, Ext 5/5  L Flex 5/5, Ext 5/5 Sit to stands x10, x5, x5 4in step 2x5 each  STM to R knee and surrounding structures  R knee patellar mobs  04/28/23 STM to R knee and surrounding structures  R knee patellar mobs  R knee PROM NuStep L45 x 6 min Gait outdoors out the front door, around the back building up and down slopes, 4 total stand ing rest breaks to complete due to pulmonary limitations. PT ambulated wit her R and lightly grasping the inner L elbow of therapist LAQ 2lb 2x10  04/23/23 Nustep L 5 RT knee Act ext     -4    Pass 0 RT knee ext MMT 3+ with pain HS curls RLE green 2x10 RLE LAQ  2lb 2x10-visible shaking Standing RT LE hip flex,flex with ER,abd,ext 10 x 2 # S2S x10 ADD ball squeeze 20 x STM to R knee and surrounding structures-very tender and tight lateral knee and distal IT,also tender medial HS incertion  R knee patellar mobs  R knee PROM   04/21/23 NuStep L5 x 6 min MT  STM to R knee and surrounding structures  R knee patellar mobs  R knee PROM HS curls RLE green 2x10 RLE 2lb 2x10  RLE LAQ 2lb 2x10 S2S x10, x7 ADD ball squeeze 20 x   04/16/23 Nustep L 5 RT knee Active ext sitting -5 with pain MMT RT knee ext 4- with pain and HS 4/5 STW to medial and lateral knee, jt mobs left knee for jt capsule stretching HS Curls green 2x10 LAQ 2# 2 x 10 2# hip flex and abd 2 sets 10 ADD ball squeeze 20 x Ionto RT medial knee 4 hour patch 1.2 cc dex   04/14/23 NuStep L 5 x 8 min Sit to stands 2x10  LAQ 2lb 2x10 Seated March 2lb 2x10 HS Curls green 2x10 4in step ups HHA x 1 x 10 each PATIENT EDUCATION:  Education details: POC and HEP Person educated: Patient Education method: Explanation Education comprehension:  verbalized understanding  HOME EXERCISE PROGRAM: Standing Knee flexion at the counter, 2x10 Standing Marches at the counter, 2x10 Seated LAQ, 2x10  ASSESSMENT:  CLINICAL IMPRESSION: Still with medial knee pain, 5/10, she also is having some left hip pain, tried to focus on some strength and a lot of flexibility, added hip strength, mild c/o pain with the exercises that we did today, biggest issue was some wheezing with her lying down, needed head of bed elevated .  OBJECTIVE IMPAIRMENTS: Abnormal gait, cardiopulmonary status limiting activity, decreased balance, decreased coordination, decreased endurance, difficulty walking, decreased ROM, decreased strength, and pain.   ACTIVITY LIMITATIONS: carrying, lifting, bending, sitting, standing, squatting, stairs, transfers, bed mobility, and continence  PARTICIPATION LIMITATIONS: cleaning,  laundry, and community activity  PERSONAL FACTORS: Age, Fitness, and Time since onset of injury/illness/exacerbation are also affecting patient's functional outcome.   REHAB POTENTIAL: Good  CLINICAL DECISION MAKING: Stable/uncomplicated  EVALUATION COMPLEXITY: Low   GOALS: Goals reviewed with patient? Yes  SHORT TERM GOALS: Target date: 04/15/23 Patient will be independent with initial home program.  Baseline: Goal status: Met 03/24/23  2.  Pt will improve 5x STS to 25 seconds. Baseline: 33s Goal status: Met 17.3 sec 03/27/23  LONG TERM GOALS: Target date: 05/20/23  Pt will be independent with long term HEP for carryover and improvement of ADL difficulty. Baseline:  Goal status: progressing 04/16/23  evolving 04/23/23  2.  Pt will improve 5x STS to 20 seconds. Baseline: 33 sec (03/11/23) Goal status: Ongoing 03/26/23   04/16/23  18.71 MET  3.  Pt will improve TUG to 17 sec Baseline: 23 sec (03/11/23) Goal status: Met 15.62 03/26/23  4. Pt will improve knee strength to 4/5 or better Baseline: 3+/5 with pain Goal status: 04/16/23 met for HS  2/04/03/23 still limited and painful with ext, 04/30/23 Met   5. Patient will report decrease in pain levels to <2/10 or better  Baseline: 4-5/10  Goal status: ongoing 04/23/23, ongoing 5/10 04/30/23  05/07/23 progressing  6. Patient will achieve active terminal knee extension to 0 degrees  Baseline: -5 active extension   Goal status: 04/23/23   -4 act, 04/30/23 Met with LLE, R knee ext 2 degrees     PLAN:  PT FREQUENCY: 2x/week  PT DURATION: 10 weeks  PLANNED INTERVENTIONS: 97110-Therapeutic exercises, 97530- Therapeutic activity, 97112- Neuromuscular re-education, 97535- Self Care, and 16109- Manual therapy  PLAN FOR NEXT SESSION: decrease pain, strengthen quads and hamstrings, gait training, work on hip strength   Jearld Lesch, PT [facility] Out[facility] Rehabilitation at [facility] [address] [facility] [place],  [place], [address] Phone: [phone]   Fax:  [phone]

## 2023-05-14 ENCOUNTER — Ambulatory Visit: Payer: Medicare Other | Admitting: Physical Therapy

## 2023-05-14 DIAGNOSIS — G8929 Other chronic pain: Secondary | ICD-10-CM

## 2023-05-14 DIAGNOSIS — M25562 Pain in left knee: Secondary | ICD-10-CM | POA: Diagnosis not present

## 2023-05-14 DIAGNOSIS — R262 Difficulty in walking, not elsewhere classified: Secondary | ICD-10-CM

## 2023-05-14 DIAGNOSIS — M6281 Muscle weakness (generalized): Secondary | ICD-10-CM

## 2023-05-14 NOTE — Therapy (Signed)
 OUTPATIENT PHYSICAL THERAPY LOWER EXTREMITY TREATMENT     Patient Name: Brooke Jimenez MRN: [id number] DOB:1945-03-05, 78 y.o., female Today's Date: 05/14/2023  END OF SESSION:  PT End of Session - 05/14/23 1618     Visit Number 17    Date for PT Re-Evaluation 05/20/23    PT Start Time 1618    PT Stop Time 1700    PT Time Calculation (min) 42 min              Past Medical History:  Diagnosis Date   Acne rosacea    Anxiety    Arthritis    arthritis in hip    Bronchiectasis (HCC)    Cataract    beginning   Complication of anesthesia    "really sore throat"   Corneal dystrophy    DDD (degenerative disc disease), cervical    and lower back   Diverticulosis    Dysphagia    chronic- please see ultrasound done 4/16 15 in [facility]    Dysrhythmia    sometimes patient has extra beats per patient    Esophageal dysmotility    Family history of adverse reaction to anesthesia    mother slow to wake up    GERD (gastroesophageal reflux disease)    under control   Glaucoma    "suspect"   Gout    one finger x1 attack   H/O hiatal hernia    Hearing loss    bilateral due to nerve loss. wears hearing aides   Heart murmur    no problem    History of chicken pox    History of melanoma    melanoma- 1986 and 1997    Hypothyroidism    Low back pain with sciatica    Measles    hx of   Mitral valve prolapse    Nocturia    Osteoporosis    Peripheral neuropathy    feet   PONV (postoperative nausea and vomiting)    PTSD (post-traumatic stress disorder) 2006   RSD (reflex sympathetic dystrophy) 1993   Spider veins    Urinary incontinence    occasional   Varicose veins    Past Surgical History:  Procedure Laterality Date   ESOPHAGEAL MANOMETRY N/A 12/17/2015   Procedure: ESOPHAGEAL MANOMETRY (EM);  Surgeon: Napoleon Form, MD;  [facility];  Service: Endoscopy;  Laterality: N/A;   HARDWARE REMOVAL Left 10/17/2013   Procedure: HARDWARE REMOVAL LEFT HIP;   Surgeon: Loanne Drilling, MD;  [facility];  Service: Orthopedics;  Laterality: Left;   HIP FRACTURE SURGERY Left 2006   3 screws   INSERTION OF MESH  02/08/2014   Procedure: INSERTION OF MESH;  Surgeon: Avel Peace, MD;  [facility];  Service: General;;   melanoma surgery   1986, 1997   PARATHYROIDECTOMY  04/02/15   PH IMPEDANCE STUDY N/A 12/17/2015   Procedure: PH IMPEDANCE STUDY;  Surgeon: Napoleon Form, MD;  [facility]: [facility];  Service: Endoscopy;  Laterality: N/A;   TOTAL HIP ARTHROPLASTY Left 04/10/2014   Procedure: LEFT TOTAL HIP ARTHROPLASTY ANTERIOR APPROACH;  Surgeon: Loanne Drilling, MD;  [facility];  Service: Orthopedics;  Laterality: Left;   UMBILICAL HERNIA REPAIR N/A 02/08/2014   Procedure: HERNIA REPAIR UMBILICAL ADULT WITH MESH;  Surgeon: Avel Peace, MD;  [facility];  Service: General;  Laterality: N/A;   Patient Active Problem List   Diagnosis Date Noted   Reactive monocytosis 11/29/2021   Rosacea 11/29/2021  Upper respiratory infection 01/15/2018   Postherpetic neuralgia 12/25/2016   Dysesthesia 12/25/2016   Polyneuropathy 12/25/2016   Exposure to influenza 04/01/2016   Dysphagia    Bronchiectasis without acute exacerbation (HCC) 08/30/2015   Bronchiectasis (HCC) 08/14/2015   Chronic rhinitis 08/14/2015   Avascular necrosis of femur head, left (HCC) 04/10/2014   OA (osteoarthritis) of hip 04/10/2014   Hypothyroidism 12/09/2013   Painful orthopaedic hardware (HCC) 10/17/2013    PCP: Dr. Ninetta Lights  REFERRING PROVIDER: Dr. Ollen Gross  REFERRING DIAG: Right knee pain with pes bursitis and chondromalacia patella  THERAPY DIAG:  Chronic pain of left knee  Muscle weakness (generalized)  Difficulty in walking, not elsewhere classified  Rationale for Evaluation and Treatment: Rehabilitation  ONSET DATE: April 2024  SUBJECTIVE:   SUBJECTIVE STATEMENT: Doing okay right now  PERTINENT HISTORY: Hip  osteoarthritis Avascular necrosis of left femoral head DDD Dysrhythmia Gout Hearing loss LBP with Sciatica Osteoporosis Peripheral neuropathy (feet) Reflex Sympathetic Dystrophy PAIN:  Are you having pain? Yes: NPRS scale: 0/10 sporadic Pain location: medial and lateral posterior of R knee Pain description: sharp Aggravating factors: bent for long time, steps, walking for a long period of time, standing for long periods, general movement Relieving factors: medication  PRECAUTIONS: Fall  RED FLAGS: None   WEIGHT BEARING RESTRICTIONS: No  FALLS:  Has patient fallen in last 6 months? Yes. Number of falls 1  LIVING ENVIRONMENT: Lives with: lives with their family and lives with their spouse Lives in: House/apartment Stairs: Yes: Internal: 1 flight steps; on right going up and External: 3 steps; on right going up Has following equipment at home: Single point cane, Walker - 2 wheeled, and Environmental consultant - 4 wheeled  OCCUPATION: retired  PLOF: Independent and Independent with basic ADLs  PATIENT GOALS: walk normally, gain confidence with gait  NEXT MD VISIT: none set   OBJECTIVE:  Note: Objective measures were completed at Evaluation unless otherwise noted.  DIAGNOSTIC FINDINGS: cartilage loss ine right knee  PATIENT SURVEYS:  FOTO 43  COGNITION: Overall cognitive status: Within functional limits for tasks assessed      POSTURE: rounded shoulders  PALPATION: Medial and Lateral right knee TTP  LOWER EXTREMITY ROM:  Active ROM Right eval Left eval  Hip flexion [facility]  Hip extension    Hip abduction    Hip adduction    Hip internal rotation    Hip external rotation    Knee flexion WFL* WFL  Knee extension Lack 25* WFL  Ankle dorsiflexion [facility]  Ankle plantarflexion [facility] WFL  Ankle inversion    Ankle eversion     (Blank rows = not tested, * = pain)  LOWER EXTREMITY MMT:  MMT Right eval Left eval  Hip flexion 4/5 4/5  Hip extension    Hip abduction  4/5 4/5  Hip adduction 4/5 4/5  Hip internal rotation    Hip external rotation    Knee flexion 3+/5 4-/5  Knee extension 3+/5* 4-/5  Ankle dorsiflexion    Ankle plantarflexion    Ankle inversion    Ankle eversion     (Blank rows = not tested, * = pain   FUNCTIONAL TESTS:  5 times sit to stand: 33 seconds Timed up and go (TUG): 23 seconds  TREATMENT DATE:   05/14/23 Nustep L 5 7 min 3# standing hip flex ( bend knee and SL), hip ext and hip abd 10 x each 3# seated IR and ER 10  BIL 3# seated LAQ,hip flex and abd 10 x each 3# HHA marching fwd and back 10 feet 2 x each RT LE stretching and STW to medial HS incertion    05/12/23 Nustep level 5 x 6 minutes HS curls 15# 2x10 Feet on ball K2C, trunk rotation, small bridge, isometric abs Passive stretch LE's 3# hip abduction 3# hip extension Green tband clamshells  05/07/23 Nustep L 5 6  min HS curl 2 sets 10 green tband BIL Red tband hip abd and hip flexion 2 sets 10 BIL Red tband LAQ 2 sets 10 BIL HHA 3# marching fwd and backward 15 feet 2 times each then side stepping STM to R knee and surrounding structures- tender and tight HS  Seated HS stretch- issued to do at home   04/30/23 NuStep L 5 x 6 min GOALS MMT Knee   R Flex 4+/5, Ext 5/5  L Flex 5/5, Ext 5/5 Sit to stands x10, x5, x5 4in step 2x5 each  STM to R knee and surrounding structures  R knee patellar mobs  04/28/23 STM to R knee and surrounding structures  R knee patellar mobs  R knee PROM NuStep L45 x 6 min Gait outdoors out the front door, around the back building up and down slopes, 4 total stand ing rest breaks to complete due to pulmonary limitations. PT ambulated wit her R and lightly grasping the inner L elbow of therapist LAQ 2lb 2x10  04/23/23 Nustep L 5 RT knee Act ext     -4    Pass 0 RT knee ext MMT 3+ with  pain HS curls RLE green 2x10 RLE LAQ 2lb 2x10-visible shaking Standing RT LE hip flex,flex with ER,abd,ext 10 x 2 # S2S x10 ADD ball squeeze 20 x STM to R knee and surrounding structures-very tender and tight lateral knee and distal IT,also tender medial HS incertion  R knee patellar mobs  R knee PROM   04/21/23 NuStep L5 x 6 min MT  STM to R knee and surrounding structures  R knee patellar mobs  R knee PROM HS curls RLE green 2x10 RLE 2lb 2x10  RLE LAQ 2lb 2x10 S2S x10, x7 ADD ball squeeze 20 x   04/16/23 Nustep L 5 RT knee Active ext sitting -5 with pain MMT RT knee ext 4- with pain and HS 4/5 STW to medial and lateral knee, jt mobs left knee for jt capsule stretching HS Curls green 2x10 LAQ 2# 2 x 10 2# hip flex and abd 2 sets 10 ADD ball squeeze 20 x Ionto RT medial knee 4 hour patch 1.2 cc dex   04/14/23 NuStep L 5 x 8 min Sit to stands 2x10  LAQ 2lb 2x10 Seated March 2lb 2x10 HS Curls green 2x10 4in step ups HHA x 1 x 10 each PATIENT EDUCATION:  Education details: POC and HEP Person educated: Patient Education method: Explanation Education comprehension: verbalized understanding  HOME EXERCISE PROGRAM: Standing Knee flexion at the counter, 2x10 Standing Marches at the counter, 2x10 Seated LAQ, 2x10  ASSESSMENT:  CLINICAL IMPRESSION: Pt tolerated increased ex today and did well. Tenderness RT medial knee at HS incretion. Goals assessed and documented.  OBJECTIVE IMPAIRMENTS: Abnormal gait, cardiopulmonary status limiting activity, decreased balance, decreased coordination, decreased endurance, difficulty walking, decreased ROM, decreased  strength, and pain.   ACTIVITY LIMITATIONS: carrying, lifting, bending, sitting, standing, squatting, stairs, transfers, bed mobility, and continence  PARTICIPATION LIMITATIONS: cleaning, laundry, and community activity  PERSONAL FACTORS: Age, Fitness, and Time since onset of injury/illness/exacerbation are also  affecting patient's functional outcome.   REHAB POTENTIAL: Good  CLINICAL DECISION MAKING: Stable/uncomplicated  EVALUATION COMPLEXITY: Low   GOALS: Goals reviewed with patient? Yes  SHORT TERM GOALS: Target date: 04/15/23 Patient will be independent with initial home program.  Baseline: Goal status: Met 03/24/23  2.  Pt will improve 5x STS to 25 seconds. Baseline: 33s Goal status: Met 17.3 sec 03/27/23  LONG TERM GOALS: Target date: 05/20/23  Pt will be independent with long term HEP for carryover and improvement of ADL difficulty. Baseline:  Goal status: progressing 04/16/23  evolving 04/23/23   Progressing 05/14/23  2.  Pt will improve 5x STS to 20 seconds. Baseline: 33 sec (03/11/23) Goal status: Ongoing 03/26/23   04/16/23  18.71 MET  3.  Pt will improve TUG to 17 sec Baseline: 23 sec (03/11/23) Goal status: Met 15.62 03/26/23  4. Pt will improve knee strength to 4/5 or better Baseline: 3+/5 with pain Goal status: 04/16/23 met for HS  2/04/03/23 still limited and painful with ext, 04/30/23 Met   5. Patient will report decrease in pain levels to <2/10 or better  Baseline: 4-5/10  Goal status: ongoing 04/23/23, ongoing 5/10 04/30/23  05/07/23 progressing and 05/14/23  6. Patient will achieve active terminal knee extension to 0 degrees  Baseline: -5 active extension   Goal status: 04/23/23   -4 act, 04/30/23 Met with LLE, R knee ext 2 degrees     PLAN:  PT FREQUENCY: 2x/week  PT DURATION: 10 weeks  PLANNED INTERVENTIONS: 97110-Therapeutic exercises, 97530- Therapeutic activity, 97112- Neuromuscular re-education, 97535- Self Care, and 99371- Manual therapy  PLAN FOR NEXT SESSION: decrease pain, strengthen quads and hamstrings, gait training, work on hip strength   Candus Braud,ANGIE, PTA [facility] Outpatient Rehabilitation at [facility] [address] [facility] [place], [place], [address] Phone: [phone]   Fax:  [phone] Outpatient  Rehabilitation at [facility] [address] [facility] [address] [place], [place], [address] Phone: [phone]   Fax:  [phone]  Patient Details  Name: Brooke Jimenez MRN: [id number] Date of Birth: 12-01-1945 Referring Provider:  Laqueta Due., MD  Encounter Date: 05/14/2023   Suanne Marker, PTA 05/14/2023, 4:19 PM  [facility] Outpatient Rehabilitation at [facility] [address] [facility] [place], [place], [address] Phone: [phone]   Fax:  [phone]

## 2023-05-19 ENCOUNTER — Encounter: Payer: Self-pay | Admitting: Physical Therapy

## 2023-05-19 ENCOUNTER — Ambulatory Visit: Payer: Medicare Other | Admitting: Physical Therapy

## 2023-05-19 ENCOUNTER — Telehealth: Payer: Self-pay | Admitting: Cardiovascular Disease

## 2023-05-19 DIAGNOSIS — G8929 Other chronic pain: Secondary | ICD-10-CM

## 2023-05-19 DIAGNOSIS — R600 Localized edema: Secondary | ICD-10-CM

## 2023-05-19 DIAGNOSIS — M25562 Pain in left knee: Secondary | ICD-10-CM | POA: Diagnosis not present

## 2023-05-19 DIAGNOSIS — M6281 Muscle weakness (generalized): Secondary | ICD-10-CM

## 2023-05-19 DIAGNOSIS — R262 Difficulty in walking, not elsewhere classified: Secondary | ICD-10-CM

## 2023-05-19 NOTE — Therapy (Signed)
 OUTPATIENT PHYSICAL THERAPY LOWER EXTREMITY TREATMENT     Patient Name: Brooke Jimenez MRN: [id number] DOB:06-14-1945, 78 y.o., female Today's Date: 05/19/2023  END OF SESSION:  PT End of Session - 05/19/23 1606     Visit Number 18    Date for PT Re-Evaluation 05/20/23    PT Start Time 1606    PT Stop Time 1645    PT Time Calculation (min) 39 min    Activity Tolerance Patient tolerated treatment well    Behavior During Therapy WFL for tasks assessed/performed              Past Medical History:  Diagnosis Date   Acne rosacea    Anxiety    Arthritis    arthritis in hip    Bronchiectasis (HCC)    Cataract    beginning   Complication of anesthesia    "really sore throat"   Corneal dystrophy    DDD (degenerative disc disease), cervical    and lower back   Diverticulosis    Dysphagia    chronic- please see ultrasound done 4/16 15 in [facility]    Dysrhythmia    sometimes patient has extra beats per patient    Esophageal dysmotility    Family history of adverse reaction to anesthesia    mother slow to wake up    GERD (gastroesophageal reflux disease)    under control   Glaucoma    "suspect"   Gout    one finger x1 attack   H/O hiatal hernia    Hearing loss    bilateral due to nerve loss. wears hearing aides   Heart murmur    no problem    History of chicken pox    History of melanoma    melanoma- 1986 and 1997    Hypothyroidism    Low back pain with sciatica    Measles    hx of   Mitral valve prolapse    Nocturia    Osteoporosis    Peripheral neuropathy    feet   PONV (postoperative nausea and vomiting)    PTSD (post-traumatic stress disorder) 2006   RSD (reflex sympathetic dystrophy) 1993   Spider veins    Urinary incontinence    occasional   Varicose veins    Past Surgical History:  Procedure Laterality Date   ESOPHAGEAL MANOMETRY N/A 12/17/2015   Procedure: ESOPHAGEAL MANOMETRY (EM);  Surgeon: Napoleon Form, MD;  Location: [facility];   Service: Endoscopy;  Laterality: N/A;   HARDWARE REMOVAL Left 10/17/2013   Procedure: HARDWARE REMOVAL LEFT HIP;  Surgeon: Loanne Drilling, MD;  Location: [facility];  Service: Orthopedics;  Laterality: Left;   HIP FRACTURE SURGERY Left 2006   3 screws   INSERTION OF MESH  02/08/2014   Procedure: INSERTION OF MESH;  Surgeon: Avel Peace, MD;  Location: [facility];  Service: General;;   melanoma surgery   1986, 1997   PARATHYROIDECTOMY  04/02/15   PH IMPEDANCE STUDY N/A 12/17/2015   Procedure: PH IMPEDANCE STUDY;  Surgeon: Napoleon Form, MD;  Location: [facility];  Service: Endoscopy;  Laterality: N/A;   TOTAL HIP ARTHROPLASTY Left 04/10/2014   Procedure: LEFT TOTAL HIP ARTHROPLASTY ANTERIOR APPROACH;  Surgeon: Loanne Drilling, MD;  Location: [facility];  Service: Orthopedics;  Laterality: Left;   UMBILICAL HERNIA REPAIR N/A 02/08/2014   Procedure: HERNIA REPAIR UMBILICAL ADULT WITH MESH;  Surgeon: Avel Peace, MD;  Location: [facility];  Service: General;  Laterality: N/A;   Patient  Active Problem List   Diagnosis Date Noted   Reactive monocytosis 11/29/2021   Rosacea 11/29/2021   Upper respiratory infection 01/15/2018   Postherpetic neuralgia 12/25/2016   Dysesthesia 12/25/2016   Polyneuropathy 12/25/2016   Exposure to influenza 04/01/2016   Dysphagia    Bronchiectasis without acute exacerbation (HCC) 08/30/2015   Bronchiectasis (HCC) 08/14/2015   Chronic rhinitis 08/14/2015   Avascular necrosis of femur head, left (HCC) 04/10/2014   OA (osteoarthritis) of hip 04/10/2014   Hypothyroidism 12/09/2013   Painful orthopaedic hardware (HCC) 10/17/2013    PCP: Dr. Ninetta Lights  REFERRING PROVIDER: Dr. Ollen Gross  REFERRING DIAG: Right knee pain with pes bursitis and chondromalacia patella  THERAPY DIAG:  Chronic pain of left knee  Muscle weakness (generalized)  Difficulty in walking, not elsewhere classified  Localized edema  Rationale for Evaluation and Treatment:  Rehabilitation  ONSET DATE: April 2024  SUBJECTIVE:   SUBJECTIVE STATEMENT: Kinda sorta doing ok, hurts but have it under control  PERTINENT HISTORY: Hip osteoarthritis Avascular necrosis of left femoral head DDD Dysrhythmia Gout Hearing loss LBP with Sciatica Osteoporosis Peripheral neuropathy (feet) Reflex Sympathetic Dystrophy PAIN:  Are you having pain? Yes: NPRS scale: 0/10 sporadic Pain location: medial and lateral posterior of R knee Pain description: sharp Aggravating factors: bent for long time, steps, walking for a long period of time, standing for long periods, general movement Relieving factors: medication  PRECAUTIONS: Fall  RED FLAGS: None   WEIGHT BEARING RESTRICTIONS: No  FALLS:  Has patient fallen in last 6 months? Yes. Number of falls 1  LIVING ENVIRONMENT: Lives with: lives with their family and lives with their spouse Lives in: House/apartment Stairs: Yes: Internal: 1 flight steps; on right going up and External: 3 steps; on right going up Has following equipment at home: Single point cane, Walker - 2 wheeled, and Environmental consultant - 4 wheeled  OCCUPATION: retired  PLOF: Independent and Independent with basic ADLs  PATIENT GOALS: walk normally, gain confidence with gait  NEXT MD VISIT: none set   OBJECTIVE:  Note: Objective measures were completed at Evaluation unless otherwise noted.  DIAGNOSTIC FINDINGS: cartilage loss ine right knee  PATIENT SURVEYS:  FOTO 43  COGNITION: Overall cognitive status: Within functional limits for tasks assessed      POSTURE: rounded shoulders  PALPATION: Medial and Lateral right knee TTP  LOWER EXTREMITY ROM:  Active ROM Right eval Left eval  Hip flexion [facility]  Hip extension    Hip abduction    Hip adduction    Hip internal rotation    Hip external rotation    Knee flexion WFL* WFL  Knee extension Lack 25* WFL  Ankle dorsiflexion [facility]  Ankle plantarflexion [facility] WFL  Ankle inversion     Ankle eversion     (Blank rows = not tested, * = pain)  LOWER EXTREMITY MMT:  MMT Right eval Left eval  Hip flexion 4/5 4/5  Hip extension    Hip abduction 4/5 4/5  Hip adduction 4/5 4/5  Hip internal rotation    Hip external rotation    Knee flexion 3+/5 4-/5  Knee extension 3+/5* 4-/5  Ankle dorsiflexion    Ankle plantarflexion    Ankle inversion    Ankle eversion     (Blank rows = not tested, * = pain   FUNCTIONAL TESTS:  5 times sit to stand: 33 seconds Timed up and go (TUG): 23 seconds  TREATMENT DATE:  05/19/23 NuStep L5 x 6 min 20lb resisted forward and backward walking  LLE pain with backwards walking 4in step ups 2x5  STM to R knee and surrounding structures  R knee patellar mobs   05/14/23 Nustep L 5 7 min 3# standing hip flex ( bend knee and SL), hip ext and hip abd 10 x each 3# seated IR and ER 10  BIL 3# seated LAQ,hip flex and abd 10 x each 3# HHA marching fwd and back 10 feet 2 x each RT LE stretching and STW to medial HS incertion    05/12/23 Nustep level 5 x 6 minutes HS curls 15# 2x10 Feet on ball K2C, trunk rotation, small bridge, isometric abs Passive stretch LE's 3# hip abduction 3# hip extension Green tband clamshells  05/07/23 Nustep L 5 6  min HS curl 2 sets 10 green tband BIL Red tband hip abd and hip flexion 2 sets 10 BIL Red tband LAQ 2 sets 10 BIL HHA 3# marching fwd and backward 15 feet 2 times each then side stepping STM to R knee and surrounding structures- tender and tight HS  Seated HS stretch- issued to do at home   04/30/23 NuStep L 5 x 6 min GOALS MMT Knee   R Flex 4+/5, Ext 5/5  L Flex 5/5, Ext 5/5 Sit to stands x10, x5, x5 4in step 2x5 each  STM to R knee and surrounding structures  R knee patellar mobs  04/28/23 STM to R knee and surrounding structures  R knee patellar mobs  R knee  PROM NuStep L45 x 6 min Gait outdoors out the front door, around the back building up and down slopes, 4 total stand ing rest breaks to complete due to pulmonary limitations. PT ambulated wit her R and lightly grasping the inner L elbow of therapist LAQ 2lb 2x10  04/23/23 Nustep L 5 RT knee Act ext     -4    Pass 0 RT knee ext MMT 3+ with pain HS curls RLE green 2x10 RLE LAQ 2lb 2x10-visible shaking Standing RT LE hip flex,flex with ER,abd,ext 10 x 2 # S2S x10 ADD ball squeeze 20 x STM to R knee and surrounding structures-very tender and tight lateral knee and distal IT,also tender medial HS incertion  R knee patellar mobs  R knee PROM   04/21/23 NuStep L5 x 6 min MT  STM to R knee and surrounding structures  R knee patellar mobs  R knee PROM HS curls RLE green 2x10 RLE 2lb 2x10  RLE LAQ 2lb 2x10 S2S x10, x7 ADD ball squeeze 20 x   04/16/23 Nustep L 5 RT knee Active ext sitting -5 with pain MMT RT knee ext 4- with pain and HS 4/5 STW to medial and lateral knee, jt mobs left knee for jt capsule stretching HS Curls green 2x10 LAQ 2# 2 x 10 2# hip flex and abd 2 sets 10 ADD ball squeeze 20 x Ionto RT medial knee 4 hour patch 1.2 cc dex   04/14/23 NuStep L 5 x 8 min Sit to stands 2x10  LAQ 2lb 2x10 Seated March 2lb 2x10 HS Curls green 2x10 4in step ups HHA x 1 x 10 each PATIENT EDUCATION:  Education details: POC and HEP Person educated: Patient Education method: Explanation Education comprehension: verbalized understanding  HOME EXERCISE PROGRAM: Standing Knee flexion at the counter, 2x10 Standing Marches at the counter, 2x10 Seated LAQ, 2x10  ASSESSMENT:  CLINICAL IMPRESSION: Pt enters ~  6 I late feeling "SO SO", increase LLE pain reported during the backwards walking of resisted gait. Tenderness RT medial knee at HS incretion, as well as L ITB. Positive response to MT.  OBJECTIVE IMPAIRMENTS: Abnormal gait, cardiopulmonary status limiting  activity, decreased balance, decreased coordination, decreased endurance, difficulty walking, decreased ROM, decreased strength, and pain.   ACTIVITY LIMITATIONS: carrying, lifting, bending, sitting, standing, squatting, stairs, transfers, bed mobility, and continence  PARTICIPATION LIMITATIONS: cleaning, laundry, and community activity  PERSONAL FACTORS: Age, Fitness, and Time since onset of injury/illness/exacerbation are also affecting patient's functional outcome.   REHAB POTENTIAL: Good  CLINICAL DECISION MAKING: Stable/uncomplicated  EVALUATION COMPLEXITY: Low   GOALS: Goals reviewed with patient? Yes  SHORT TERM GOALS: Target date: 04/15/23 Patient will be independent with initial home program.  Baseline: Goal status: Met 03/24/23  2.  Pt will improve 5x STS to 25 seconds. Baseline: 33s Goal status: Met 17.3 sec 03/27/23  LONG TERM GOALS: Target date: 05/20/23  Pt will be independent with long term HEP for carryover and improvement of ADL difficulty. Baseline:  Goal status: progressing 04/16/23  evolving 04/23/23   Progressing 05/14/23  2.  Pt will improve 5x STS to 20 seconds. Baseline: 33 sec (03/11/23) Goal status: Ongoing 03/26/23   04/16/23  18.71 MET  3.  Pt will improve TUG to 17 sec Baseline: 23 sec (03/11/23) Goal status: Met 15.62 03/26/23  4. Pt will improve knee strength to 4/5 or better Baseline: 3+/5 with pain Goal status: 04/16/23 met for HS  2/04/03/23 still limited and painful with ext, 04/30/23 Met   5. Patient will report decrease in pain levels to <2/10 or better  Baseline: 4-5/10  Goal status: ongoing 04/23/23, ongoing 5/10 04/30/23  05/07/23 progressing and 05/14/23  6. Patient will achieve active terminal knee extension to 0 degrees  Baseline: -5 active extension   Goal status: 04/23/23   -4 act, 04/30/23 Met with LLE, R knee ext 2 degrees     PLAN:  PT FREQUENCY: 2x/week  PT DURATION: 10 weeks  PLANNED INTERVENTIONS: 97110-Therapeutic exercises,  97530- Therapeutic activity, 97112- Neuromuscular re-education, 97535- Self Care, and 82956- Manual therapy  PLAN FOR NEXT SESSION: decrease pain, strengthen quads and hamstrings, gait training, work on hip strength   Grayce Sessions, PTA

## 2023-05-19 NOTE — Telephone Encounter (Signed)
 Spoke to Pt after speaking to Pharmacy. Pharmacy stated that Pt should have enough medication for until April 17th. Pt recounted her medication and found that she had plenty for until April 17th.

## 2023-05-19 NOTE — Telephone Encounter (Signed)
*  STAT* If patient is at the pharmacy, call can be transferred to refill team.   1. Which medications need to be refilled? (please list name of each medication and dose if known)   metoprolol succinate (TOPROL-XL) 25 MG 24 hr tablet   2. Would you like to learn more about the convenience, safety, & potential cost savings by using the [facility]?   3. Are you open to using the [facility] (Type [facility]. ).  4. Which pharmacy/location (including street and city if local pharmacy) is medication to be sent to?  [id number] - [place], [place] - [address]   5. Do they need a 30 day or 90 day supply?   90 day  Patient stated she only has 1 tablet left.

## 2023-05-21 ENCOUNTER — Ambulatory Visit: Payer: Medicare Other | Admitting: Physical Therapy

## 2023-05-21 DIAGNOSIS — R262 Difficulty in walking, not elsewhere classified: Secondary | ICD-10-CM

## 2023-05-21 DIAGNOSIS — G8929 Other chronic pain: Secondary | ICD-10-CM

## 2023-05-21 DIAGNOSIS — M6281 Muscle weakness (generalized): Secondary | ICD-10-CM

## 2023-05-21 DIAGNOSIS — M25562 Pain in left knee: Secondary | ICD-10-CM | POA: Diagnosis not present

## 2023-05-21 NOTE — Therapy (Addendum)
 OUTPATIENT PHYSICAL THERAPY LOWER EXTREMITY TREATMENT     Patient Name: Brooke Jimenez MRN: [id number] DOB:08/30/1945, 78 y.o., female Today's Date: 05/21/2023  END OF SESSION:  PT End of Session - 05/21/23 1530     Visit Number 19    PT Start Time 1530    PT Stop Time 1615    PT Time Calculation (min) 45 min              Past Medical History:  Diagnosis Date   Acne rosacea    Anxiety    Arthritis    arthritis in hip    Bronchiectasis (HCC)    Cataract    beginning   Complication of anesthesia    "really sore throat"   Corneal dystrophy    DDD (degenerative disc disease), cervical    and lower back   Diverticulosis    Dysphagia    chronic- please see ultrasound done 4/16 15 in [facility]    Dysrhythmia    sometimes patient has extra beats per patient    Esophageal dysmotility    Family history of adverse reaction to anesthesia    mother slow to wake up    GERD (gastroesophageal reflux disease)    under control   Glaucoma    "suspect"   Gout    one finger x1 attack   H/O hiatal hernia    Hearing loss    bilateral due to nerve loss. wears hearing aides   Heart murmur    no problem    History of chicken pox    History of melanoma    melanoma- 1986 and 1997    Hypothyroidism    Low back pain with sciatica    Measles    hx of   Mitral valve prolapse    Nocturia    Osteoporosis    Peripheral neuropathy    feet   PONV (postoperative nausea and vomiting)    PTSD (post-traumatic stress disorder) 2006   RSD (reflex sympathetic dystrophy) 1993   Spider veins    Urinary incontinence    occasional   Varicose veins    Past Surgical History:  Procedure Laterality Date   ESOPHAGEAL MANOMETRY N/A 12/17/2015   Procedure: ESOPHAGEAL MANOMETRY (EM);  Surgeon: Napoleon Form, MD;  Location: [facility];  Service: Endoscopy;  Laterality: N/A;   HARDWARE REMOVAL Left 10/17/2013   Procedure: HARDWARE REMOVAL LEFT HIP;  Surgeon: Loanne Drilling, MD;  Location:  [facility];  Service: Orthopedics;  Laterality: Left;   HIP FRACTURE SURGERY Left 2006   3 screws   INSERTION OF MESH  02/08/2014   Procedure: INSERTION OF MESH;  Surgeon: Avel Peace, MD;  Location: [facility];  Service: General;;   melanoma surgery   1986, 1997   PARATHYROIDECTOMY  04/02/15   PH IMPEDANCE STUDY N/A 12/17/2015   Procedure: PH IMPEDANCE STUDY;  Surgeon: Napoleon Form, MD;  Location: [facility];  Service: Endoscopy;  Laterality: N/A;   TOTAL HIP ARTHROPLASTY Left 04/10/2014   Procedure: LEFT TOTAL HIP ARTHROPLASTY ANTERIOR APPROACH;  Surgeon: Loanne Drilling, MD;  Location: [facility];  Service: Orthopedics;  Laterality: Left;   UMBILICAL HERNIA REPAIR N/A 02/08/2014   Procedure: HERNIA REPAIR UMBILICAL ADULT WITH MESH;  Surgeon: Avel Peace, MD;  Location: [facility];  Service: General;  Laterality: N/A;   Patient Active Problem List   Diagnosis Date Noted   Reactive monocytosis 11/29/2021   Rosacea 11/29/2021   Upper respiratory infection 01/15/2018   Postherpetic neuralgia  12/25/2016   Dysesthesia 12/25/2016   Polyneuropathy 12/25/2016   Exposure to influenza 04/01/2016   Dysphagia    Bronchiectasis without acute exacerbation (HCC) 08/30/2015   Bronchiectasis (HCC) 08/14/2015   Chronic rhinitis 08/14/2015   Avascular necrosis of femur head, left (HCC) 04/10/2014   OA (osteoarthritis) of hip 04/10/2014   Hypothyroidism 12/09/2013   Painful orthopaedic hardware (HCC) 10/17/2013    PCP: Dr. Ninetta Lights  REFERRING PROVIDER: Dr. Ollen Gross  REFERRING DIAG: Right knee pain with pes bursitis and chondromalacia patella  THERAPY DIAG:  Chronic pain of left knee  Muscle weakness (generalized)  Difficulty in walking, not elsewhere classified  Rationale for Evaluation and Treatment: Rehabilitation  ONSET DATE: April 2024  SUBJECTIVE:   SUBJECTIVE STATEMENT: Still all the same pain but maybe less freq. May need to transition to independent program  PERTINENT  HISTORY: Hip osteoarthritis Avascular necrosis of left femoral head DDD Dysrhythmia Gout Hearing loss LBP with Sciatica Osteoporosis Peripheral neuropathy (feet) Reflex Sympathetic Dystrophy PAIN:  Are you having pain? Yes: NPRS scale: 0/10 sporadic Pain location: medial and lateral posterior of R knee Pain description: sharp Aggravating factors: bent for long time, steps, walking for a long period of time, standing for long periods, general movement Relieving factors: medication  PRECAUTIONS: Fall  RED FLAGS: None   WEIGHT BEARING RESTRICTIONS: No  FALLS:  Has patient fallen in last 6 months? Yes. Number of falls 1  LIVING ENVIRONMENT: Lives with: lives with their family and lives with their spouse Lives in: House/apartment Stairs: Yes: Internal: 1 flight steps; on right going up and External: 3 steps; on right going up Has following equipment at home: Single point cane, Walker - 2 wheeled, and Environmental consultant - 4 wheeled  OCCUPATION: retired  PLOF: Independent and Independent with basic ADLs  PATIENT GOALS: walk normally, gain confidence with gait  NEXT MD VISIT: none set   OBJECTIVE:  Note: Objective measures were completed at Evaluation unless otherwise noted.  DIAGNOSTIC FINDINGS: cartilage loss ine right knee  PATIENT SURVEYS:  FOTO 43  COGNITION: Overall cognitive status: Within functional limits for tasks assessed      POSTURE: rounded shoulders  PALPATION: Medial and Lateral right knee TTP  LOWER EXTREMITY ROM:  Active ROM Right eval Left eval RT  05/21/23  Hip flexion [facility]   Hip extension     Hip abduction     Hip adduction     Hip internal rotation     Hip external rotation     Knee flexion WFL* WFL   Knee extension Lack 25* WFL 0  Ankle dorsiflexion [facility]   Ankle plantarflexion [facility] WFL   Ankle inversion     Ankle eversion      (Blank rows = not tested, * = pain)  LOWER EXTREMITY MMT:  MMT Right eval Left eval RT/Left  05/21/23   Hip flexion 4/5 4/5 4/4+  Hip extension     Hip abduction 4/5 4/5 4+  Hip adduction 4/5 4/5 4+  Hip internal rotation     Hip external rotation     Knee flexion 3+/5 4-/5 4+  Knee extension 3+/5* 4-/5 4+  Ankle dorsiflexion     Ankle plantarflexion     Ankle inversion     Ankle eversion      (Blank rows = not tested, * = pain   FUNCTIONAL TESTS:  5 times sit to stand: 33 seconds Timed up and go (TUG): 23 seconds  TREATMENT DATE:   05/21/23 MMT and ROM checked and documents 5 x STS 23 sec-difficult and SOB TUG 12.5 sec Nustep L 5 UBE L 2 2 min fwd/2 min back HS curl 15 # 10x 20# 10 x LAQ 5# 2 sets 7 Seated row and Lat pull Down 15# 2 sets 10      05/19/23 NuStep L5 x 6 min 20lb resisted forward and backward walking  LLE pain with backwards walking 4in step ups 2x5  STM to R knee and surrounding structures  R knee patellar mobs   05/14/23 Nustep L 5 7 min 3# standing hip flex ( bend knee and SL), hip ext and hip abd 10 x each 3# seated IR and ER 10  BIL 3# seated LAQ,hip flex and abd 10 x each 3# HHA marching fwd and back 10 feet 2 x each RT LE stretching and STW to medial HS incertion    05/12/23 Nustep level 5 x 6 minutes HS curls 15# 2x10 Feet on ball K2C, trunk rotation, small bridge, isometric abs Passive stretch LE's 3# hip abduction 3# hip extension Green tband clamshells  05/07/23 Nustep L 5 6  min HS curl 2 sets 10 green tband BIL Red tband hip abd and hip flexion 2 sets 10 BIL Red tband LAQ 2 sets 10 BIL HHA 3# marching fwd and backward 15 feet 2 times each then side stepping STM to R knee and surrounding structures- tender and tight HS  Seated HS stretch- issued to do at home   04/30/23 NuStep L 5 x 6 min GOALS MMT Knee   R Flex 4+/5, Ext 5/5  L Flex 5/5, Ext 5/5 Sit to stands x10, x5, x5 4in step 2x5  each  STM to R knee and surrounding structures  R knee patellar mobs  04/28/23 STM to R knee and surrounding structures  R knee patellar mobs  R knee PROM NuStep L45 x 6 min Gait outdoors out the front door, around the back building up and down slopes, 4 total stand ing rest breaks to complete due to pulmonary limitations. PT ambulated wit her R and lightly grasping the inner L elbow of therapist LAQ 2lb 2x10  04/23/23 Nustep L 5 RT knee Act ext     -4    Pass 0 RT knee ext MMT 3+ with pain HS curls RLE green 2x10 RLE LAQ 2lb 2x10-visible shaking Standing RT LE hip flex,flex with ER,abd,ext 10 x 2 # S2S x10 ADD ball squeeze 20 x STM to R knee and surrounding structures-very tender and tight lateral knee and distal IT,also tender medial HS incertion  R knee patellar mobs  R knee PROM   04/21/23 NuStep L5 x 6 min MT  STM to R knee and surrounding structures  R knee patellar mobs  R knee PROM HS curls RLE green 2x10 RLE 2lb 2x10  RLE LAQ 2lb 2x10 S2S x10, x7 ADD ball squeeze 20 x   04/16/23 Nustep L 5 RT knee Active ext sitting -5 with pain MMT RT knee ext 4- with pain and HS 4/5 STW to medial and lateral knee, jt mobs left knee for jt capsule stretching HS Curls green 2x10 LAQ 2# 2 x 10 2# hip flex and abd 2 sets 10 ADD ball squeeze 20 x Ionto RT medial knee 4 hour patch 1.2 cc dex   04/14/23 NuStep L 5 x 8 min Sit to stands 2x10  LAQ 2lb 2x10 Seated March 2lb 2x10 HS  Curls green 2x10 4in step ups HHA x 1 x 10 each PATIENT EDUCATION:  Education details: POC and HEP Person educated: Patient Education method: Explanation Education comprehension: verbalized understanding  HOME EXERCISE PROGRAM: Standing Knee flexion at the counter, 2x10 Standing Marches at the counter, 2x10 Seated LAQ, 2x10  ASSESSMENT:  CLINICAL IMPRESSION: assessed goals <ROM and MMT. Pt has made progress and meet most of goals with the exception of HEP . Pt feels she still has  same pain issues but less frequent. Pt agrees with transition to independent gym program so we will continue 1 x a week as she safely makes transition.   OBJECTIVE IMPAIRMENTS: Abnormal gait, cardiopulmonary status limiting activity, decreased balance, decreased coordination, decreased endurance, difficulty walking, decreased ROM, decreased strength, and pain.   ACTIVITY LIMITATIONS: carrying, lifting, bending, sitting, standing, squatting, stairs, transfers, bed mobility, and continence  PARTICIPATION LIMITATIONS: cleaning, laundry, and community activity  PERSONAL FACTORS: Age, Fitness, and Time since onset of injury/illness/exacerbation are also affecting patient's functional outcome.   REHAB POTENTIAL: Good  CLINICAL DECISION MAKING: Stable/uncomplicated  EVALUATION COMPLEXITY: Low   GOALS: Goals reviewed with patient? Yes  SHORT TERM GOALS: Target date: 04/15/23 Patient will be independent with initial home program.  Baseline: Goal status: Met 03/24/23  2.  Pt will improve 5x STS to 25 seconds. Baseline: 33s Goal status: Met 17.3 sec 03/27/23  LONG TERM GOALS: Target date: 06/18/23  Pt will be independent with long term HEP for carryover and improvement of ADL difficulty. Baseline:  Goal status: progressing 04/16/23  evolving 04/23/23   Progressing 05/14/23 Progressing 05/21/23  2.  Pt will improve 5x STS to 20 seconds. Baseline: 33 sec (03/11/23) Goal status: Ongoing 03/26/23   04/16/23  18.71 MET  3.  Pt will improve TUG to 17 sec Baseline: 23 sec (03/11/23) Goal status: Met 15.62 03/26/23  4. Pt will improve knee strength to 4/5 or better Baseline: 3+/5 with pain Goal status: 04/16/23 met for HS  2/04/03/23 still limited and painful with ext, 04/30/23 Met   5. Patient will report decrease in pain levels to <2/10 or better  Baseline: 4-5/10  Goal status: ongoing 04/23/23, ongoing 5/10 04/30/23  05/07/23 progressing and 05/14/23 Progressing and Variable 05/21/23  6. Patient will  achieve active terminal knee extension to 0 degrees  Baseline: -5 active extension   Goal status: 04/23/23   -4 act, 04/30/23 Met with LLE, R knee ext 2 degrees     PLAN:  PT FREQUENCY: 1x/week  PT DURATION: 4 weeks  PLANNED INTERVENTIONS: 97110-Therapeutic exercises, 97530- Therapeutic activity, 97112- Neuromuscular re-education, 97535- Self Care, and 16109- Manual therapy  PLAN FOR NEXT SESSION: renewal 1 x a week then pt do independent 1 x a week as we educ and transition to comfort level on machine interventions. See how pt responded to todays ex and write up sheet for independent gym program.   Suanne Marker, PTA [facility] at [facility] [address] [facility] [place], [place], [address] Phone: [phone]   Fax:  [phone]  Patient Details  Name: Brooke Jimenez MRN: [id number] Date of Birth: 1945/09/20 Referring Provider:  Laqueta Due., MD  Encounter Date: 05/21/2023   Suanne Marker, PTA 05/21/2023, 4:04 PM  [place] [facility] at [facility] [address] [place], [place], [address] Phone: [phone]   Fax:  [phone]

## 2023-05-25 ENCOUNTER — Encounter: Payer: Self-pay | Admitting: Physical Therapy

## 2023-05-25 ENCOUNTER — Ambulatory Visit (INDEPENDENT_AMBULATORY_CARE_PROVIDER_SITE_OTHER): Payer: Medicare Other

## 2023-05-25 ENCOUNTER — Ambulatory Visit: Admitting: Physical Therapy

## 2023-05-25 DIAGNOSIS — M25562 Pain in left knee: Secondary | ICD-10-CM | POA: Diagnosis not present

## 2023-05-25 DIAGNOSIS — R600 Localized edema: Secondary | ICD-10-CM

## 2023-05-25 DIAGNOSIS — I4892 Unspecified atrial flutter: Secondary | ICD-10-CM | POA: Diagnosis not present

## 2023-05-25 DIAGNOSIS — M6281 Muscle weakness (generalized): Secondary | ICD-10-CM

## 2023-05-25 DIAGNOSIS — G8929 Other chronic pain: Secondary | ICD-10-CM

## 2023-05-25 DIAGNOSIS — R262 Difficulty in walking, not elsewhere classified: Secondary | ICD-10-CM

## 2023-05-25 NOTE — Therapy (Signed)
 OUTPATIENT PHYSICAL THERAPY LOWER EXTREMITY TREATMENT Progress Note Reporting Period 04/21/23 to 05/25/23 for visits 11-20  See note below for Objective Data and Assessment of Progress/Goals.        Patient Name: Brooke Jimenez MRN: [id number] DOB:Feb 19, 1946, 78 y.o., female Today's Date: 05/25/2023  END OF SESSION:  PT End of Session - 05/25/23 1432     Visit Number 20    Date for PT Re-Evaluation 06/18/23    PT Start Time 1430    PT Stop Time 1515    PT Time Calculation (min) 45 min    Activity Tolerance Patient tolerated treatment well    Behavior During Therapy WFL for tasks assessed/performed              Past Medical History:  Diagnosis Date   Acne rosacea    Anxiety    Arthritis    arthritis in hip    Bronchiectasis (HCC)    Cataract    beginning   Complication of anesthesia    "really sore throat"   Corneal dystrophy    DDD (degenerative disc disease), cervical    and lower back   Diverticulosis    Dysphagia    chronic- please see ultrasound done 4/16 15 in [facility]    Dysrhythmia    sometimes patient has extra beats per patient    Esophageal dysmotility    Family history of adverse reaction to anesthesia    mother slow to wake up    GERD (gastroesophageal reflux disease)    under control   Glaucoma    "suspect"   Gout    one finger x1 attack   H/O hiatal hernia    Hearing loss    bilateral due to nerve loss. wears hearing aides   Heart murmur    no problem    History of chicken pox    History of melanoma    melanoma- 1986 and 1997    Hypothyroidism    Low back pain with sciatica    Measles    hx of   Mitral valve prolapse    Nocturia    Osteoporosis    Peripheral neuropathy    feet   PONV (postoperative nausea and vomiting)    PTSD (post-traumatic stress disorder) 2006   RSD (reflex sympathetic dystrophy) 1993   Spider veins    Urinary incontinence    occasional   Varicose veins    Past Surgical History:  Procedure Laterality  Date   ESOPHAGEAL MANOMETRY N/A 12/17/2015   Procedure: ESOPHAGEAL MANOMETRY (EM);  Surgeon: Napoleon Form, MD;  Location: [facility];  Service: Endoscopy;  Laterality: N/A;   HARDWARE REMOVAL Left 10/17/2013   Procedure: HARDWARE REMOVAL LEFT HIP;  Surgeon: Loanne Drilling, MD;  Location: [facility];  Service: Orthopedics;  Laterality: Left;   HIP FRACTURE SURGERY Left 2006   3 screws   INSERTION OF MESH  02/08/2014   Procedure: INSERTION OF MESH;  Surgeon: Avel Peace, MD;  Location: [facility];  Service: General;;   melanoma surgery   1986, 1997   PARATHYROIDECTOMY  04/02/15   PH IMPEDANCE STUDY N/A 12/17/2015   Procedure: PH IMPEDANCE STUDY;  Surgeon: Napoleon Form, MD;  Location: [facility];  Service: Endoscopy;  Laterality: N/A;   TOTAL HIP ARTHROPLASTY Left 04/10/2014   Procedure: LEFT TOTAL HIP ARTHROPLASTY ANTERIOR APPROACH;  Surgeon: Loanne Drilling, MD;  Location: [facility];  Service: Orthopedics;  Laterality: Left;   UMBILICAL HERNIA REPAIR N/A 02/08/2014   Procedure:  HERNIA REPAIR UMBILICAL ADULT WITH MESH;  Surgeon: Avel Peace, MD;  Location: [facility];  Service: General;  Laterality: N/A;   Patient Active Problem List   Diagnosis Date Noted   Reactive monocytosis 11/29/2021   Rosacea 11/29/2021   Upper respiratory infection 01/15/2018   Postherpetic neuralgia 12/25/2016   Dysesthesia 12/25/2016   Polyneuropathy 12/25/2016   Exposure to influenza 04/01/2016   Dysphagia    Bronchiectasis without acute exacerbation (HCC) 08/30/2015   Bronchiectasis (HCC) 08/14/2015   Chronic rhinitis 08/14/2015   Avascular necrosis of femur head, left (HCC) 04/10/2014   OA (osteoarthritis) of hip 04/10/2014   Hypothyroidism 12/09/2013   Painful orthopaedic hardware (HCC) 10/17/2013    PCP: Dr. Ninetta Lights  REFERRING PROVIDER: Dr. Ollen Gross  REFERRING DIAG: Right knee pain with pes bursitis and chondromalacia patella  THERAPY DIAG:  Chronic pain of left knee  Muscle  weakness (generalized)  Difficulty in walking, not elsewhere classified  Localized edema  Rationale for Evaluation and Treatment: Rehabilitation  ONSET DATE: April 2024  SUBJECTIVE:   SUBJECTIVE STATEMENT: "I am her" looking forward to to getting my own routine together. R knee still hurt when she first get up and start walking.   PERTINENT HISTORY: Hip osteoarthritis Avascular necrosis of left femoral head DDD Dysrhythmia Gout Hearing loss LBP with Sciatica Osteoporosis Peripheral neuropathy (feet) Reflex Sympathetic Dystrophy PAIN:  Are you having pain? Yes: NPRS scale: 0/10 sporadic Pain location: medial and lateral posterior of R knee Pain description: sharp Aggravating factors: bent for long time, steps, walking for a long period of time, standing for long periods, general movement Relieving factors: medication  PRECAUTIONS: Fall  RED FLAGS: None   WEIGHT BEARING RESTRICTIONS: No  FALLS:  Has patient fallen in last 6 months? Yes. Number of falls 1  LIVING ENVIRONMENT: Lives with: lives with their family and lives with their spouse Lives in: House/apartment Stairs: Yes: Internal: 1 flight steps; on right going up and External: 3 steps; on right going up Has following equipment at home: Single point cane, Walker - 2 wheeled, and Environmental consultant - 4 wheeled  OCCUPATION: retired  PLOF: Independent and Independent with basic ADLs  PATIENT GOALS: walk normally, gain confidence with gait  NEXT MD VISIT: none set   OBJECTIVE:  Note: Objective measures were completed at Evaluation unless otherwise noted.  DIAGNOSTIC FINDINGS: cartilage loss ine right knee  PATIENT SURVEYS:  FOTO 43  COGNITION: Overall cognitive status: Within functional limits for tasks assessed      POSTURE: rounded shoulders  PALPATION: Medial and Lateral right knee TTP  LOWER EXTREMITY ROM:  Active ROM Right eval Left eval RT  05/21/23  Hip flexion [facility]   Hip extension     Hip  abduction     Hip adduction     Hip internal rotation     Hip external rotation     Knee flexion WFL* WFL   Knee extension Lack 25* WFL 0  Ankle dorsiflexion [facility]   Ankle plantarflexion [facility]   Ankle inversion     Ankle eversion      (Blank rows = not tested, * = pain)  LOWER EXTREMITY MMT:  MMT Right eval Left eval RT/Left  05/21/23  Hip flexion 4/5 4/5 4/4+  Hip extension     Hip abduction 4/5 4/5 4+  Hip adduction 4/5 4/5 4+  Hip internal rotation     Hip external rotation     Knee flexion 3+/5 4-/5 4+  Knee  extension 3+/5* 4-/5 4+  Ankle dorsiflexion     Ankle plantarflexion     Ankle inversion     Ankle eversion      (Blank rows = not tested, * = pain   FUNCTIONAL TESTS:  5 times sit to stand: 33 seconds Timed up and go (TUG): 23 seconds                                                                                                                               TREATMENT DATE:  05/25/23 NuStep L 5 x 6 min HS curls 20lb 2x10 Leg Ext 5lb 2x10 Rows 7 lats 15lb 2x10 Chest press 5lb 2x10 Leg press 20lb 2x10 05/21/23 MMT and ROM checked and documents 5 x STS 23 sec-difficult and SOB TUG 12.5 sec Nustep L 5 UBE L 2 2 min fwd/2 min back HS curl 15 # 10x 20# 10 x LAQ 5# 2 sets 7 Seated row and Lat pull Down 15# 2 sets 10      05/19/23 NuStep L5 x 6 min 20lb resisted forward and backward walking  LLE pain with backwards walking 4in step ups 2x5  STM to R knee and surrounding structures  R knee patellar mobs   05/14/23 Nustep L 5 7 min 3# standing hip flex ( bend knee and SL), hip ext and hip abd 10 x each 3# seated IR and ER 10  BIL 3# seated LAQ,hip flex and abd 10 x each 3# HHA marching fwd and back 10 feet 2 x each RT LE stretching and STW to medial HS incertion    05/12/23 Nustep level 5 x 6 minutes HS curls 15# 2x10 Feet on ball K2C, trunk rotation, small bridge, isometric abs Passive stretch LE's 3# hip abduction 3# hip  extension Green tband clamshells  05/07/23 Nustep L 5 6  min HS curl 2 sets 10 green tband BIL Red tband hip abd and hip flexion 2 sets 10 BIL Red tband LAQ 2 sets 10 BIL HHA 3# marching fwd and backward 15 feet 2 times each then side stepping STM to R knee and surrounding structures- tender and tight HS  Seated HS stretch- issued to do at home   04/30/23 NuStep L 5 x 6 min GOALS MMT Knee   R Flex 4+/5, Ext 5/5  L Flex 5/5, Ext 5/5 Sit to stands x10, x5, x5 4in step 2x5 each  STM to R knee and surrounding structures  R knee patellar mobs  04/28/23 STM to R knee and surrounding structures  R knee patellar mobs  R knee PROM NuStep L45 x 6 min Gait outdoors out the front door, around the back building up and down slopes, 4 total stand ing rest breaks to complete due to pulmonary limitations. PT ambulated wit her R and lightly grasping the inner L elbow of therapist LAQ 2lb 2x10  04/23/23 Nustep L 5 RT knee Act ext     -  4    Pass 0 RT knee ext MMT 3+ with pain HS curls RLE green 2x10 RLE LAQ 2lb 2x10-visible shaking Standing RT LE hip flex,flex with ER,abd,ext 10 x 2 # S2S x10 ADD ball squeeze 20 x STM to R knee and surrounding structures-very tender and tight lateral knee and distal IT,also tender medial HS incertion  R knee patellar mobs  R knee PROM   04/21/23 NuStep L5 x 6 min MT  STM to R knee and surrounding structures  R knee patellar mobs  R knee PROM HS curls RLE green 2x10 RLE 2lb 2x10  RLE LAQ 2lb 2x10 S2S x10, x7 ADD ball squeeze 20 x   04/16/23 Nustep L 5 RT knee Active ext sitting -5 with pain MMT RT knee ext 4- with pain and HS 4/5 STW to medial and lateral knee, jt mobs left knee for jt capsule stretching HS Curls green 2x10 LAQ 2# 2 x 10 2# hip flex and abd 2 sets 10 ADD ball squeeze 20 x Ionto RT medial knee 4 hour patch 1.2 cc dex   04/14/23 NuStep L 5 x 8 min Sit to stands 2x10  LAQ 2lb 2x10 Seated March 2lb 2x10 HS Curls  green 2x10 4in step ups HHA x 1 x 10 each PATIENT EDUCATION:  Education details: POC and HEP Person educated: Patient Education method: Explanation Education comprehension: verbalized understanding  HOME EXERCISE PROGRAM: Standing Knee flexion at the counter, 2x10 Standing Marches at the counter, 2x10 Seated LAQ, 2x10  ASSESSMENT:  CLINICAL IMPRESSION: assessed goals <ROM and MMT last session. Pt has made progress and meet most of goals with the exception of HEP . Pt feels she still has same pain issues but less frequent. Session consisted of reviewing all machine level Interventions  that thast will be doing in the independent program. Pt return demonstration with all machines  OBJECTIVE IMPAIRMENTS: Abnormal gait, cardiopulmonary status limiting activity, decreased balance, decreased coordination, decreased endurance, difficulty walking, decreased ROM, decreased strength, and pain.   ACTIVITY LIMITATIONS: carrying, lifting, bending, sitting, standing, squatting, stairs, transfers, bed mobility, and continence  PARTICIPATION LIMITATIONS: cleaning, laundry, and community activity  PERSONAL FACTORS: Age, Fitness, and Time since onset of injury/illness/exacerbation are also affecting patient's functional outcome.   REHAB POTENTIAL: Good  CLINICAL DECISION MAKING: Stable/uncomplicated  EVALUATION COMPLEXITY: Low   GOALS: Goals reviewed with patient? Yes  SHORT TERM GOALS: Target date: 04/15/23 Patient will be independent with initial home program.  Baseline: Goal status: Met 03/24/23  2.  Pt will improve 5x STS to 25 seconds. Baseline: 33s Goal status: Met 17.3 sec 03/27/23  LONG TERM GOALS: Target date: 06/18/23  Pt will be independent with long term HEP for carryover and improvement of ADL difficulty. Baseline:  Goal status: progressing 04/16/23  evolving 04/23/23   Progressing 05/14/23 Progressing 05/21/23  2.  Pt will improve 5x STS to 20 seconds. Baseline: 33 sec  (03/11/23) Goal status: Ongoing 03/26/23   04/16/23  18.71 MET  3.  Pt will improve TUG to 17 sec Baseline: 23 sec (03/11/23) Goal status: Met 15.62 03/26/23  4. Pt will improve knee strength to 4/5 or better Baseline: 3+/5 with pain Goal status: 04/16/23 met for HS  2/04/03/23 still limited and painful with ext, 04/30/23 Met   5. Patient will report decrease in pain levels to <2/10 or better  Baseline: 4-5/10  Goal status: ongoing 04/23/23, ongoing 5/10 04/30/23  05/07/23 progressing and 05/14/23 Progressing and Variable 05/21/23  6. Patient will  achieve active terminal knee extension to 0 degrees  Baseline: -5 active extension   Goal status: 04/23/23   -4 act, 04/30/23 Met with LLE, R knee ext 2 degrees     PLAN:  PT FREQUENCY: 1x/week  PT DURATION: 4 weeks  PLANNED INTERVENTIONS: 97110-Therapeutic exercises, 97530- Therapeutic activity, 97112- Neuromuscular re-education, 97535- Self Care, and 16109- Manual therapy  PLAN FOR NEXT SESSION:Review independent program as needed, progress with functional strength Grayce Sessions, PTA

## 2023-05-26 LAB — CUP PACEART REMOTE DEVICE CHECK
Date Time Interrogation Session: 20250331080134
Implantable Pulse Generator Implant Date: 20231024
Pulse Gen Model: 5000
Pulse Gen Serial Number: 511011009

## 2023-05-27 NOTE — Progress Notes (Signed)
 [address] [employer]

## 2023-05-27 NOTE — Addendum Note (Signed)
 Addended by: Geralyn Flash D on: 05/27/2023 11:48 AM   Modules accepted: Orders

## 2023-06-01 NOTE — Therapy (Signed)
 OUTPATIENT PHYSICAL THERAPY LOWER EXTREMITY TREATMENT Progress Note Reporting Period 04/21/23 to 05/25/23 for visits 11-20  See note below for Objective Data and Assessment of Progress/Goals.        Patient Name: Brooke Jimenez MRN: [id number] DOB:[birth year], 78 y.o., female Today's Date: 06/01/2023  END OF SESSION:     Past Medical History:  Diagnosis Date   Acne rosacea    Anxiety    Arthritis    arthritis in hip    Bronchiectasis (HCC)    Cataract    beginning   Complication of anesthesia    "really sore throat"   Corneal dystrophy    DDD (degenerative disc disease), cervical    and lower back   Diverticulosis    Dysphagia    chronic- please see ultrasound done 4/16 15 in [facility]    Dysrhythmia    sometimes patient has extra beats per patient    Esophageal dysmotility    Family history of adverse reaction to anesthesia    mother slow to wake up    GERD (gastroesophageal reflux disease)    under control   Glaucoma    "suspect"   Gout    one finger x1 attack   H/O hiatal hernia    Hearing loss    bilateral due to nerve loss. wears hearing aides   Heart murmur    no problem    History of chicken pox    History of melanoma    melanoma- 1986 and 1997    Hypothyroidism    Low back pain with sciatica    Measles    hx of   Mitral valve prolapse    Nocturia    Osteoporosis    Peripheral neuropathy    feet   PONV (postoperative nausea and vomiting)    PTSD (post-traumatic stress disorder) 2006   RSD (reflex sympathetic dystrophy) 1993   Spider veins    Urinary incontinence    occasional   Varicose veins    Past Surgical History:  Procedure Laterality Date   ESOPHAGEAL MANOMETRY N/A 12/17/2015   Procedure: ESOPHAGEAL MANOMETRY (EM);  Surgeon: Napoleon Form, MD;  Location: [facility];  Service: Endoscopy;  Laterality: N/A;   HARDWARE REMOVAL Left 10/17/2013   Procedure: HARDWARE REMOVAL LEFT HIP;  Surgeon: Loanne Drilling, MD;  Location: [facility];   Service: Orthopedics;  Laterality: Left;   HIP FRACTURE SURGERY Left 2006   3 screws   INSERTION OF MESH  02/08/2014   Procedure: INSERTION OF MESH;  Surgeon: Avel Peace, MD;  Location: [facility];  Service: General;;   melanoma surgery   1986, 1997   PARATHYROIDECTOMY  04/02/15   PH IMPEDANCE STUDY N/A 12/17/2015   Procedure: PH IMPEDANCE STUDY;  Surgeon: Napoleon Form, MD;  Location: [facility];  Service: Endoscopy;  Laterality: N/A;   TOTAL HIP ARTHROPLASTY Left 04/10/2014   Procedure: LEFT TOTAL HIP ARTHROPLASTY ANTERIOR APPROACH;  Surgeon: Loanne Drilling, MD;  Location: [facility];  Service: Orthopedics;  Laterality: Left;   UMBILICAL HERNIA REPAIR N/A 02/08/2014   Procedure: HERNIA REPAIR UMBILICAL ADULT WITH MESH;  Surgeon: Avel Peace, MD;  Location: [facility];  Service: General;  Laterality: N/A;   Patient Active Problem List   Diagnosis Date Noted   Reactive monocytosis 11/29/2021   Rosacea 11/29/2021   Upper respiratory infection 01/15/2018   Postherpetic neuralgia 12/25/2016   Dysesthesia 12/25/2016   Polyneuropathy 12/25/2016   Exposure to influenza 04/01/2016   Dysphagia    Bronchiectasis without  acute exacerbation (HCC) 08/30/2015   Bronchiectasis (HCC) 08/14/2015   Chronic rhinitis 08/14/2015   Avascular necrosis of femur head, left (HCC) 04/10/2014   OA (osteoarthritis) of hip 04/10/2014   Hypothyroidism 12/09/2013   Painful orthopaedic hardware (HCC) 10/17/2013    PCP: Dr. Ninetta Lights  REFERRING PROVIDER: Dr. Ollen Gross  REFERRING DIAG: Right knee pain with pes bursitis and chondromalacia patella  THERAPY DIAG:  No diagnosis found.  Rationale for Evaluation and Treatment: Rehabilitation  ONSET DATE: April 2024  SUBJECTIVE:   SUBJECTIVE STATEMENT: "I am her" looking forward to to getting my own routine together. R knee still hurt when she first get up and start walking.   PERTINENT HISTORY: Hip osteoarthritis Avascular necrosis of left  femoral head DDD Dysrhythmia Gout Hearing loss LBP with Sciatica Osteoporosis Peripheral neuropathy (feet) Reflex Sympathetic Dystrophy PAIN:  Are you having pain? Yes: NPRS scale: 0/10 sporadic Pain location: medial and lateral posterior of R knee Pain description: sharp Aggravating factors: bent for long time, steps, walking for a long period of time, standing for long periods, general movement Relieving factors: medication  PRECAUTIONS: Fall  RED FLAGS: None   WEIGHT BEARING RESTRICTIONS: No  FALLS:  Has patient fallen in last 6 months? Yes. Number of falls 1  LIVING ENVIRONMENT: Lives with: lives with their family and lives with their spouse Lives in: House/apartment Stairs: Yes: Internal: 1 flight steps; on right going up and External: 3 steps; on right going up Has following equipment at home: Single point cane, Walker - 2 wheeled, and Environmental consultant - 4 wheeled  OCCUPATION: retired  PLOF: Independent and Independent with basic ADLs  PATIENT GOALS: walk normally, gain confidence with gait  NEXT MD VISIT: none set   OBJECTIVE:  Note: Objective measures were completed at Evaluation unless otherwise noted.  DIAGNOSTIC FINDINGS: cartilage loss ine right knee  PATIENT SURVEYS:  FOTO 43  COGNITION: Overall cognitive status: Within functional limits for tasks assessed      POSTURE: rounded shoulders  PALPATION: Medial and Lateral right knee TTP  LOWER EXTREMITY ROM:  Active ROM Right eval Left eval RT  05/21/23  Hip flexion [facility]   Hip extension     Hip abduction     Hip adduction     Hip internal rotation     Hip external rotation     Knee flexion WFL* WFL   Knee extension Lack 25* WFL 0  Ankle dorsiflexion [facility]   Ankle plantarflexion [facility] WFL   Ankle inversion     Ankle eversion      (Blank rows = not tested, * = pain)  LOWER EXTREMITY MMT:  MMT Right eval Left eval RT/Left  05/21/23  Hip flexion 4/5 4/5 4/4+  Hip extension     Hip  abduction 4/5 4/5 4+  Hip adduction 4/5 4/5 4+  Hip internal rotation     Hip external rotation     Knee flexion 3+/5 4-/5 4+  Knee extension 3+/5* 4-/5 4+  Ankle dorsiflexion     Ankle plantarflexion     Ankle inversion     Ankle eversion      (Blank rows = not tested, * = pain   FUNCTIONAL TESTS:  5 times sit to stand: 33 seconds Timed up and go (TUG): 23 seconds  TREATMENT DATE:  06/02/23 NuStep Step ups  HS curls Leg ext STS  Resisted gait    05/25/23 NuStep L 5 x 6 min HS curls 20lb 2x10 Leg Ext 5lb 2x10 Rows 7 lats 15lb 2x10 Chest press 5lb 2x10 Leg press 20lb 2x10  05/21/23 MMT and ROM checked and documents 5 x STS 23 sec-difficult and SOB TUG 12.5 sec Nustep L 5 UBE L 2 2 min fwd/2 min back HS curl 15 # 10x 20# 10 x LAQ 5# 2 sets 7 Seated row and Lat pull Down 15# 2 sets 10    05/19/23 NuStep L5 x 6 min 20lb resisted forward and backward walking  LLE pain with backwards walking 4in step ups 2x5  STM to R knee and surrounding structures  R knee patellar mobs   05/14/23 Nustep L 5 7 min 3# standing hip flex ( bend knee and SL), hip ext and hip abd 10 x each 3# seated IR and ER 10  BIL 3# seated LAQ,hip flex and abd 10 x each 3# HHA marching fwd and back 10 feet 2 x each RT LE stretching and STW to medial HS incertion    05/12/23 Nustep level 5 x 6 minutes HS curls 15# 2x10 Feet on ball K2C, trunk rotation, small bridge, isometric abs Passive stretch LE's 3# hip abduction 3# hip extension Green tband clamshells  05/07/23 Nustep L 5 6  min HS curl 2 sets 10 green tband BIL Red tband hip abd and hip flexion 2 sets 10 BIL Red tband LAQ 2 sets 10 BIL HHA 3# marching fwd and backward 15 feet 2 times each then side stepping STM to R knee and surrounding structures- tender and tight HS  Seated HS stretch- issued to  do at home   04/30/23 NuStep L 5 x 6 min GOALS MMT Knee   R Flex 4+/5, Ext 5/5  L Flex 5/5, Ext 5/5 Sit to stands x10, x5, x5 4in step 2x5 each  STM to R knee and surrounding structures  R knee patellar mobs  04/28/23 STM to R knee and surrounding structures  R knee patellar mobs  R knee PROM NuStep L45 x 6 min Gait outdoors out the front door, around the back building up and down slopes, 4 total stand ing rest breaks to complete due to pulmonary limitations. PT ambulated wit her R and lightly grasping the inner L elbow of therapist LAQ 2lb 2x10  04/23/23 Nustep L 5 RT knee Act ext     -4    Pass 0 RT knee ext MMT 3+ with pain HS curls RLE green 2x10 RLE LAQ 2lb 2x10-visible shaking Standing RT LE hip flex,flex with ER,abd,ext 10 x 2 # S2S x10 ADD ball squeeze 20 x STM to R knee and surrounding structures-very tender and tight lateral knee and distal IT,also tender medial HS incertion  R knee patellar mobs  R knee PROM   04/21/23 NuStep L5 x 6 min MT  STM to R knee and surrounding structures  R knee patellar mobs  R knee PROM HS curls RLE green 2x10 RLE 2lb 2x10  RLE LAQ 2lb 2x10 S2S x10, x7 ADD ball squeeze 20 x   04/16/23 Nustep L 5 RT knee Active ext sitting -5 with pain MMT RT knee ext 4- with pain and HS 4/5 STW to medial and lateral knee, jt mobs left knee for jt capsule stretching HS Curls green 2x10 LAQ 2# 2 x 10 2# hip flex  and abd 2 sets 10 ADD ball squeeze 20 x Ionto RT medial knee 4 hour patch 1.2 cc dex   04/14/23 NuStep L 5 x 8 min Sit to stands 2x10  LAQ 2lb 2x10 Seated March 2lb 2x10 HS Curls green 2x10 4in step ups HHA x 1 x 10 each PATIENT EDUCATION:  Education details: POC and HEP Person educated: Patient Education method: Explanation Education comprehension: verbalized understanding  HOME EXERCISE PROGRAM: Standing Knee flexion at the counter, 2x10 Standing Marches at the counter, 2x10 Seated LAQ,  2x10  ASSESSMENT:  CLINICAL IMPRESSION: assessed goals <ROM and MMT last session. Pt has made progress and meet most of goals with the exception of HEP . Pt feels she still has same pain issues but less frequent. Session consisted of reviewing all machine level Interventions  that thast will be doing in the independent program. Pt return demonstration with all machines  OBJECTIVE IMPAIRMENTS: Abnormal gait, cardiopulmonary status limiting activity, decreased balance, decreased coordination, decreased endurance, difficulty walking, decreased ROM, decreased strength, and pain.   ACTIVITY LIMITATIONS: carrying, lifting, bending, sitting, standing, squatting, stairs, transfers, bed mobility, and continence  PARTICIPATION LIMITATIONS: cleaning, laundry, and community activity  PERSONAL FACTORS: Age, Fitness, and Time since onset of injury/illness/exacerbation are also affecting patient's functional outcome.   REHAB POTENTIAL: Good  CLINICAL DECISION MAKING: Stable/uncomplicated  EVALUATION COMPLEXITY: Low   GOALS: Goals reviewed with patient? Yes  SHORT TERM GOALS: Target date: 04/15/23 Patient will be independent with initial home program.  Baseline: Goal status: Met 03/24/23  2.  Pt will improve 5x STS to 25 seconds. Baseline: 33s Goal status: Met 17.3 sec 03/27/23  LONG TERM GOALS: Target date: 06/18/23  Pt will be independent with long term HEP for carryover and improvement of ADL difficulty. Baseline:  Goal status: progressing 04/16/23  evolving 04/23/23   Progressing 05/14/23 Progressing 05/21/23  2.  Pt will improve 5x STS to 20 seconds. Baseline: 33 sec (03/11/23) Goal status: Ongoing 03/26/23   04/16/23  18.71 MET  3.  Pt will improve TUG to 17 sec Baseline: 23 sec (03/11/23) Goal status: Met 15.62 03/26/23  4. Pt will improve knee strength to 4/5 or better Baseline: 3+/5 with pain Goal status: 04/16/23 met for HS  2/04/03/23 still limited and painful with ext, 04/30/23 Met   5.  Patient will report decrease in pain levels to <2/10 or better  Baseline: 4-5/10  Goal status: ongoing 04/23/23, ongoing 5/10 04/30/23  05/07/23 progressing and 05/14/23 Progressing and Variable 05/21/23  6. Patient will achieve active terminal knee extension to 0 degrees  Baseline: -5 active extension   Goal status: 04/23/23   -4 act, 04/30/23 Met with LLE, R knee ext 2 degrees     PLAN:  PT FREQUENCY: 1x/week  PT DURATION: 4 weeks  PLANNED INTERVENTIONS: 97110-Therapeutic exercises, 97530- Therapeutic activity, 97112- Neuromuscular re-education, 97535- Self Care, and 16109- Manual therapy  PLAN FOR NEXT SESSION:Review independent program as needed, progress with functional strength Cassie Freer, PT

## 2023-06-02 ENCOUNTER — Ambulatory Visit: Attending: Internal Medicine

## 2023-06-02 DIAGNOSIS — R600 Localized edema: Secondary | ICD-10-CM | POA: Diagnosis present

## 2023-06-02 DIAGNOSIS — M6281 Muscle weakness (generalized): Secondary | ICD-10-CM | POA: Insufficient documentation

## 2023-06-02 DIAGNOSIS — M25552 Pain in left hip: Secondary | ICD-10-CM | POA: Insufficient documentation

## 2023-06-02 DIAGNOSIS — R262 Difficulty in walking, not elsewhere classified: Secondary | ICD-10-CM | POA: Diagnosis present

## 2023-06-02 DIAGNOSIS — M25562 Pain in left knee: Secondary | ICD-10-CM | POA: Diagnosis present

## 2023-06-02 DIAGNOSIS — G8929 Other chronic pain: Secondary | ICD-10-CM | POA: Insufficient documentation

## 2023-06-04 ENCOUNTER — Encounter: Payer: Self-pay | Admitting: Cardiovascular Disease

## 2023-06-09 ENCOUNTER — Ambulatory Visit: Admitting: Physical Therapy

## 2023-06-09 ENCOUNTER — Encounter: Payer: Self-pay | Admitting: Physical Therapy

## 2023-06-09 DIAGNOSIS — R262 Difficulty in walking, not elsewhere classified: Secondary | ICD-10-CM

## 2023-06-09 DIAGNOSIS — M6281 Muscle weakness (generalized): Secondary | ICD-10-CM | POA: Diagnosis not present

## 2023-06-09 DIAGNOSIS — M25552 Pain in left hip: Secondary | ICD-10-CM

## 2023-06-09 DIAGNOSIS — G8929 Other chronic pain: Secondary | ICD-10-CM

## 2023-06-09 NOTE — Therapy (Signed)
 OUTPATIENT PHYSICAL THERAPY LOWER EXTREMITY TREATMENT   Patient Name: Brooke Jimenez MRN: [id number] DOB:18-Jul-1945, 78 y.o., female Today's Date: 06/09/2023  END OF SESSION:  PT End of Session - 06/09/23 1530     Visit Number 22    Date for PT Re-Evaluation 06/23/23    PT Start Time 1528    PT Stop Time 1614    PT Time Calculation (min) 46 min    Activity Tolerance Patient tolerated treatment well    Behavior During Therapy WFL for tasks assessed/performed               Past Medical History:  Diagnosis Date   Acne rosacea    Anxiety    Arthritis    arthritis in hip    Bronchiectasis (HCC)    Cataract    beginning   Complication of anesthesia    "really sore throat"   Corneal dystrophy    DDD (degenerative disc disease), cervical    and lower back   Diverticulosis    Dysphagia    chronic- please see ultrasound done 4/16 15 in [facility]    Dysrhythmia    sometimes patient has extra beats per patient    Esophageal dysmotility    Family history of adverse reaction to anesthesia    mother slow to wake up    GERD (gastroesophageal reflux disease)    under control   Glaucoma    "suspect"   Gout    one finger x1 attack   H/O hiatal hernia    Hearing loss    bilateral due to nerve loss. wears hearing aides   Heart murmur    no problem    History of chicken pox    History of melanoma    melanoma- 1986 and 1997    Hypothyroidism    Low back pain with sciatica    Measles    hx of   Mitral valve prolapse    Nocturia    Osteoporosis    Peripheral neuropathy    feet   PONV (postoperative nausea and vomiting)    PTSD (post-traumatic stress disorder) 2006   RSD (reflex sympathetic dystrophy) 1993   Spider veins    Urinary incontinence    occasional   Varicose veins    Past Surgical History:  Procedure Laterality Date   ESOPHAGEAL MANOMETRY N/A 12/17/2015   Procedure: ESOPHAGEAL MANOMETRY (EM);  Surgeon: Sergio Dandy, MD;  Location: [facility];   Service: Endoscopy;  Laterality: N/A;   HARDWARE REMOVAL Left 10/17/2013   Procedure: HARDWARE REMOVAL LEFT HIP;  Surgeon: Aurther Blue, MD;  Location: [facility];  Service: Orthopedics;  Laterality: Left;   HIP FRACTURE SURGERY Left 2006   3 screws   INSERTION OF MESH  02/08/2014   Procedure: INSERTION OF MESH;  Surgeon: Adalberto Hollow, MD;  Location: [facility];  Service: General;;   melanoma surgery   1986, 1997   PARATHYROIDECTOMY  04/02/15   PH IMPEDANCE STUDY N/A 12/17/2015   Procedure: PH IMPEDANCE STUDY;  Surgeon: Sergio Dandy, MD;  Location: [facility];  Service: Endoscopy;  Laterality: N/A;   TOTAL HIP ARTHROPLASTY Left 04/10/2014   Procedure: LEFT TOTAL HIP ARTHROPLASTY ANTERIOR APPROACH;  Surgeon: Aurther Blue, MD;  Location: [facility];  Service: Orthopedics;  Laterality: Left;   UMBILICAL HERNIA REPAIR N/A 02/08/2014   Procedure: HERNIA REPAIR UMBILICAL ADULT WITH MESH;  Surgeon: Adalberto Hollow, MD;  Location: [facility];  Service: General;  Laterality: N/A;   Patient Active  Problem List   Diagnosis Date Noted   Reactive monocytosis 11/29/2021   Rosacea 11/29/2021   Upper respiratory infection 01/15/2018   Postherpetic neuralgia 12/25/2016   Dysesthesia 12/25/2016   Polyneuropathy 12/25/2016   Exposure to influenza 04/01/2016   Dysphagia    Bronchiectasis without acute exacerbation (HCC) 08/30/2015   Bronchiectasis (HCC) 08/14/2015   Chronic rhinitis 08/14/2015   Avascular necrosis of femur head, left (HCC) 04/10/2014   OA (osteoarthritis) of hip 04/10/2014   Hypothyroidism 12/09/2013   Painful orthopaedic hardware (HCC) 10/17/2013    PCP: Dr. Ninetta Lights  REFERRING PROVIDER: Dr. Ollen Gross  REFERRING DIAG: Right knee pain with pes bursitis and chondromalacia patella  THERAPY DIAG:  Muscle weakness (generalized)  Chronic pain of left knee  Difficulty in walking, not elsewhere classified  Pain in left hip  Rationale for Evaluation and Treatment:  Rehabilitation  ONSET DATE: April 2024  SUBJECTIVE:   SUBJECTIVE STATEMENT: I am doing better, still sore and pain in the knees and the hip, still slow with walking   PERTINENT HISTORY: Hip osteoarthritis Avascular necrosis of left femoral head DDD Dysrhythmia Gout Hearing loss LBP with Sciatica Osteoporosis Peripheral neuropathy (feet) Reflex Sympathetic Dystrophy PAIN:  Are you having pain? Yes: NPRS scale: 0/10 sporadic Pain location: medial and lateral posterior of R knee Pain description: sharp Aggravating factors: bent for long time, steps, walking for a long period of time, standing for long periods, general movement Relieving factors: medication  PRECAUTIONS: Fall  RED FLAGS: None   WEIGHT BEARING RESTRICTIONS: No  FALLS:  Has patient fallen in last 6 months? Yes. Number of falls 1  LIVING ENVIRONMENT: Lives with: lives with their family and lives with their spouse Lives in: House/apartment Stairs: Yes: Internal: 1 flight steps; on right going up and External: 3 steps; on right going up Has following equipment at home: Single point cane, Walker - 2 wheeled, and Environmental consultant - 4 wheeled  OCCUPATION: retired  PLOF: Independent and Independent with basic ADLs  PATIENT GOALS: walk normally, gain confidence with gait  NEXT MD VISIT: none set   OBJECTIVE:  Note: Objective measures were completed at Evaluation unless otherwise noted.  DIAGNOSTIC FINDINGS: cartilage loss ine right knee  PATIENT SURVEYS:  FOTO 43  COGNITION: Overall cognitive status: Within functional limits for tasks assessed      POSTURE: rounded shoulders  PALPATION: Medial and Lateral right knee TTP  LOWER EXTREMITY ROM:  Active ROM Right eval Left eval RT  05/21/23  Hip flexion [facility]   Hip extension     Hip abduction     Hip adduction     Hip internal rotation     Hip external rotation     Knee flexion WFL* WFL   Knee extension Lack 25* WFL 0  Ankle dorsiflexion [facility]  [facility]   Ankle plantarflexion [facility]   Ankle inversion     Ankle eversion      (Blank rows = not tested, * = pain)  LOWER EXTREMITY MMT:  MMT Right eval Left eval RT/Left  05/21/23  Hip flexion 4/5 4/5 4/4+  Hip extension     Hip abduction 4/5 4/5 4+  Hip adduction 4/5 4/5 4+  Hip internal rotation     Hip external rotation     Knee flexion 3+/5 4-/5 4+  Knee extension 3+/5* 4-/5 4+  Ankle dorsiflexion     Ankle plantarflexion     Ankle inversion     Ankle eversion      (  Blank rows = not tested, * = pain   FUNCTIONAL TESTS:  5 times sit to stand: 33 seconds Timed up and go (TUG): 23 seconds                                                                                                                               TREATMENT DATE:  06/09/23 Discussed discharge planning with patient, talked with her about independent gym program, talked about HEP and her bike at home, talked about discipline and setting and making time for herself Nustep Level 5 x 6 minutes Rockerboard 2 ways balance needed CGA On airex reaching On airex ball toss Back to wall butt and scapular touches Cone toe touches Walking ball toss Bosu balance upside down  06/02/23 NuStep L5x27mins  Step ups 6"  HS curls 20# 2x1x0 Leg ext 5# 2x10  STS 2x10 Resisted gait 20# 3 way x3 each way   05/25/23 NuStep L 5 x 6 min HS curls 20lb 2x10 Leg Ext 5lb 2x10 Rows 7 lats 15lb 2x10 Chest press 5lb 2x10 Leg press 20lb 2x10  05/21/23 MMT and ROM checked and documents 5 x STS 23 sec-difficult and SOB TUG 12.5 sec Nustep L 5 UBE L 2 2 min fwd/2 min back HS curl 15 # 10x 20# 10 x LAQ 5# 2 sets 7 Seated row and Lat pull Down 15# 2 sets 10    05/19/23 NuStep L5 x 6 min 20lb resisted forward and backward walking  LLE pain with backwards walking 4in step ups 2x5  STM to R knee and surrounding structures  R knee patellar mobs   05/14/23 Nustep L 5 7 min 3# standing hip flex ( bend knee and SL), hip  ext and hip abd 10 x each 3# seated IR and ER 10  BIL 3# seated LAQ,hip flex and abd 10 x each 3# HHA marching fwd and back 10 feet 2 x each RT LE stretching and STW to medial HS incertion    05/12/23 Nustep level 5 x 6 minutes HS curls 15# 2x10 Feet on ball K2C, trunk rotation, small bridge, isometric abs Passive stretch LE's 3# hip abduction 3# hip extension Green tband clamshells  05/07/23 Nustep L 5 6  min HS curl 2 sets 10 green tband BIL Red tband hip abd and hip flexion 2 sets 10 BIL Red tband LAQ 2 sets 10 BIL HHA 3# marching fwd and backward 15 feet 2 times each then side stepping STM to R knee and surrounding structures- tender and tight HS  Seated HS stretch- issued to do at home   04/30/23 NuStep L 5 x 6 min GOALS MMT Knee   R Flex 4+/5, Ext 5/5  L Flex 5/5, Ext 5/5 Sit to stands x10, x5, x5 4in step 2x5 each  STM to R knee and surrounding structures  R knee patellar mobs  04/28/23 STM to R knee and surrounding structures  R knee patellar  mobs  R knee PROM NuStep L45 x 6 min Gait outdoors out the front door, around the back building up and down slopes, 4 total stand ing rest breaks to complete due to pulmonary limitations. PT ambulated wit her R and lightly grasping the inner L elbow of therapist LAQ 2lb 2x10  04/23/23 Nustep L 5 RT knee Act ext     -4    Pass 0 RT knee ext MMT 3+ with pain HS curls RLE green 2x10 RLE LAQ 2lb 2x10-visible shaking Standing RT LE hip flex,flex with ER,abd,ext 10 x 2 # S2S x10 ADD ball squeeze 20 x STM to R knee and surrounding structures-very tender and tight lateral knee and distal IT,also tender medial HS incertion  R knee patellar mobs  R knee PROM   04/21/23 NuStep L5 x 6 min MT  STM to R knee and surrounding structures  R knee patellar mobs  R knee PROM HS curls RLE green 2x10 RLE 2lb 2x10  RLE LAQ 2lb 2x10 S2S x10, x7 ADD ball squeeze 20 x   04/16/23 Nustep L 5 RT knee Active ext sitting -5  with pain MMT RT knee ext 4- with pain and HS 4/5 STW to medial and lateral knee, jt mobs left knee for jt capsule stretching HS Curls green 2x10 LAQ 2# 2 x 10 2# hip flex and abd 2 sets 10 ADD ball squeeze 20 x Ionto RT medial knee 4 hour patch 1.2 cc dex   04/14/23 NuStep L 5 x 8 min Sit to stands 2x10  LAQ 2lb 2x10 Seated March 2lb 2x10 HS Curls green 2x10 4in step ups HHA x 1 x 10 each PATIENT EDUCATION:  Education details: POC and HEP Person educated: Patient Education method: Explanation Education comprehension: verbalized understanding  HOME EXERCISE PROGRAM: Standing Knee flexion at the counter, 2x10 Standing Marches at the counter, 2x10 Seated LAQ, 2x10  ASSESSMENT:  CLINICAL IMPRESSION: Pt has made progress and meet most of goals with the exception of HEP. I focused treatment today on balance, she did have difficulty with the higher level balance activities , I did start the process of talking to her about independent gym vs doing at home and she will need better discipline   OBJECTIVE IMPAIRMENTS: Abnormal gait, cardiopulmonary status limiting activity, decreased balance, decreased coordination, decreased endurance, difficulty walking, decreased ROM, decreased strength, and pain.   ACTIVITY LIMITATIONS: carrying, lifting, bending, sitting, standing, squatting, stairs, transfers, bed mobility, and continence  PARTICIPATION LIMITATIONS: cleaning, laundry, and community activity  PERSONAL FACTORS: Age, Fitness, and Time since onset of injury/illness/exacerbation are also affecting patient's functional outcome.   REHAB POTENTIAL: Good  CLINICAL DECISION MAKING: Stable/uncomplicated  EVALUATION COMPLEXITY: Low   GOALS: Goals reviewed with patient? Yes  SHORT TERM GOALS: Target date: 04/15/23 Patient will be independent with initial home program.  Baseline: Goal status: Met 03/24/23  2.  Pt will improve 5x STS to 25 seconds. Baseline: 33s Goal status: Met  17.3 sec 03/27/23  LONG TERM GOALS: Target date: 06/18/23  Pt will be independent with long term HEP for carryover and improvement of ADL difficulty. Baseline:  Goal status: progressing 04/16/23  evolving 04/23/23   Progressing 05/14/23 Progressing 05/21/23  2.  Pt will improve 5x STS to 20 seconds. Baseline: 33 sec (03/11/23) Goal status: Ongoing 03/26/23   04/16/23  18.71 MET  3.  Pt will improve TUG to 17 sec Baseline: 23 sec (03/11/23) Goal status: Met 15.62 03/26/23  4. Pt will improve  knee strength to 4/5 or better Baseline: 3+/5 with pain Goal status: 04/16/23 met for HS  2/04/03/23 still limited and painful with ext, 04/30/23 Met   5. Patient will report decrease in pain levels to <2/10 or better  Baseline: 4-5/10  Goal status: ongoing 04/23/23, ongoing 5/10 04/30/23  05/07/23 progressing and 05/14/23 Progressing and Variable 05/21/23  6. Patient will achieve active terminal knee extension to 0 degrees  Baseline: -5 active extension   Goal status: 04/23/23   -4 act, 04/30/23 Met with LLE, R knee ext 2 degrees     PLAN:  PT FREQUENCY: 1x/week  PT DURATION: 4 weeks  PLANNED INTERVENTIONS: 97110-Therapeutic exercises, 97530- Therapeutic activity, 97112- Neuromuscular re-education, 97535- Self Care, and 16109- Manual therapy  PLAN FOR NEXT SESSION:Review independent program as needed, progress with functional strength Hollis Lurie, PT

## 2023-06-16 ENCOUNTER — Ambulatory Visit: Admitting: Physical Therapy

## 2023-06-16 ENCOUNTER — Encounter: Payer: Self-pay | Admitting: Physical Therapy

## 2023-06-16 DIAGNOSIS — M6281 Muscle weakness (generalized): Secondary | ICD-10-CM

## 2023-06-16 DIAGNOSIS — R600 Localized edema: Secondary | ICD-10-CM

## 2023-06-16 DIAGNOSIS — G8929 Other chronic pain: Secondary | ICD-10-CM

## 2023-06-16 DIAGNOSIS — R262 Difficulty in walking, not elsewhere classified: Secondary | ICD-10-CM

## 2023-06-16 NOTE — Therapy (Signed)
 OUTPATIENT PHYSICAL THERAPY LOWER EXTREMITY TREATMENT   Patient Name: Brooke Jimenez MRN: [id number] DOB:Jul 10, 1945, 78 y.o., female Today's Date: 06/16/2023  END OF SESSION:  PT End of Session - 06/16/23 1522     Visit Number 23    Date for PT Re-Evaluation 06/23/23    PT Start Time 1522    PT Stop Time 1600    PT Time Calculation (min) 38 min    Activity Tolerance Patient tolerated treatment well    Behavior During Therapy WFL for tasks assessed/performed               Past Medical History:  Diagnosis Date   Acne rosacea    Anxiety    Arthritis    arthritis in hip    Bronchiectasis (HCC)    Cataract    beginning   Complication of anesthesia    "really sore throat"   Corneal dystrophy    DDD (degenerative disc disease), cervical    and lower back   Diverticulosis    Dysphagia    chronic- please see ultrasound done 4/16 15 in [facility]    Dysrhythmia    sometimes patient has extra beats per patient    Esophageal dysmotility    Family history of adverse reaction to anesthesia    mother slow to wake up    GERD (gastroesophageal reflux disease)    under control   Glaucoma    "suspect"   Gout    one finger x1 attack   H/O hiatal hernia    Hearing loss    bilateral due to nerve loss. wears hearing aides   Heart murmur    no problem    History of chicken pox    History of melanoma    melanoma- 1986 and 1997    Hypothyroidism    Low back pain with sciatica    Measles    hx of   Mitral valve prolapse    Nocturia    Osteoporosis    Peripheral neuropathy    feet   PONV (postoperative nausea and vomiting)    PTSD (post-traumatic stress disorder) 2006   RSD (reflex sympathetic dystrophy) 1993   Spider veins    Urinary incontinence    occasional   Varicose veins    Past Surgical History:  Procedure Laterality Date   ESOPHAGEAL MANOMETRY N/A 12/17/2015   Procedure: ESOPHAGEAL MANOMETRY (EM);  Surgeon: Sergio Dandy, MD;  Location: [facility];   Service: Endoscopy;  Laterality: N/A;   HARDWARE REMOVAL Left 10/17/2013   Procedure: HARDWARE REMOVAL LEFT HIP;  Surgeon: Aurther Blue, MD;  Location: [facility];  Service: Orthopedics;  Laterality: Left;   HIP FRACTURE SURGERY Left 2006   3 screws   INSERTION OF MESH  02/08/2014   Procedure: INSERTION OF MESH;  Surgeon: Adalberto Hollow, MD;  Location: [facility];  Service: General;;   melanoma surgery   1986, 1997   PARATHYROIDECTOMY  04/02/15   PH IMPEDANCE STUDY N/A 12/17/2015   Procedure: PH IMPEDANCE STUDY;  Surgeon: Sergio Dandy, MD;  Location: [facility];  Service: Endoscopy;  Laterality: N/A;   TOTAL HIP ARTHROPLASTY Left 04/10/2014   Procedure: LEFT TOTAL HIP ARTHROPLASTY ANTERIOR APPROACH;  Surgeon: Aurther Blue, MD;  Location: [facility];  Service: Orthopedics;  Laterality: Left;   UMBILICAL HERNIA REPAIR N/A 02/08/2014   Procedure: HERNIA REPAIR UMBILICAL ADULT WITH MESH;  Surgeon: Adalberto Hollow, MD;  Location: [facility];  Service: General;  Laterality: N/A;   Patient Active  Problem List   Diagnosis Date Noted   Reactive monocytosis 11/29/2021   Rosacea 11/29/2021   Upper respiratory infection 01/15/2018   Postherpetic neuralgia 12/25/2016   Dysesthesia 12/25/2016   Polyneuropathy 12/25/2016   Exposure to influenza 04/01/2016   Dysphagia    Bronchiectasis without acute exacerbation (HCC) 08/30/2015   Bronchiectasis (HCC) 08/14/2015   Chronic rhinitis 08/14/2015   Avascular necrosis of femur head, left (HCC) 04/10/2014   OA (osteoarthritis) of hip 04/10/2014   Hypothyroidism 12/09/2013   Painful orthopaedic hardware (HCC) 10/17/2013    PCP: Dr. Laureen Pon  REFERRING PROVIDER: Dr. Liliane Rei  REFERRING DIAG: Right knee pain with pes bursitis and chondromalacia patella  THERAPY DIAG:  Muscle weakness (generalized)  Difficulty in walking, not elsewhere classified  Chronic pain of left knee  Localized edema  Rationale for Evaluation and Treatment:  Rehabilitation  ONSET DATE: April 2024  SUBJECTIVE:   SUBJECTIVE STATEMENT: "I am good, I'm tired, I'm not in great pain"   PERTINENT HISTORY: Hip osteoarthritis Avascular necrosis of left femoral head DDD Dysrhythmia Gout Hearing loss LBP with Sciatica Osteoporosis Peripheral neuropathy (feet) Reflex Sympathetic Dystrophy PAIN:  Are you having pain? Yes: NPRS scale: 2/10 sitting, sometimes walking 6/10 Pain location: medial and lateral posterior of R knee Pain description: sharp Aggravating factors: bent for long time, steps, walking for a long period of time, standing for long periods, general movement Relieving factors: medication  PRECAUTIONS: Fall  RED FLAGS: None   WEIGHT BEARING RESTRICTIONS: No  FALLS:  Has patient fallen in last 6 months? Yes. Number of falls 1  LIVING ENVIRONMENT: Lives with: lives with their family and lives with their spouse Lives in: House/apartment Stairs: Yes: Internal: 1 flight steps; on right going up and External: 3 steps; on right going up Has following equipment at home: Single point cane, Walker - 2 wheeled, and Environmental consultant - 4 wheeled  OCCUPATION: retired  PLOF: Independent and Independent with basic ADLs  PATIENT GOALS: walk normally, gain confidence with gait  NEXT MD VISIT: none set   OBJECTIVE:  Note: Objective measures were completed at Evaluation unless otherwise noted.  DIAGNOSTIC FINDINGS: cartilage loss ine right knee  PATIENT SURVEYS:  FOTO 43  COGNITION: Overall cognitive status: Within functional limits for tasks assessed      POSTURE: rounded shoulders  PALPATION: Medial and Lateral right knee TTP  LOWER EXTREMITY ROM:  Active ROM Right eval Left eval RT  05/21/23  Hip flexion [facility]   Hip extension     Hip abduction     Hip adduction     Hip internal rotation     Hip external rotation     Knee flexion WFL* WFL   Knee extension Lack 25* WFL 0  Ankle dorsiflexion [facility]   Ankle  plantarflexion [facility]   Ankle inversion     Ankle eversion      (Blank rows = not tested, * = pain)  LOWER EXTREMITY MMT:  MMT Right eval Left eval RT/Left  05/21/23  Hip flexion 4/5 4/5 4/4+  Hip extension     Hip abduction 4/5 4/5 4+  Hip adduction 4/5 4/5 4+  Hip internal rotation     Hip external rotation     Knee flexion 3+/5 4-/5 4+  Knee extension 3+/5* 4-/5 4+  Ankle dorsiflexion     Ankle plantarflexion     Ankle inversion     Ankle eversion      (Blank rows = not tested, * =  pain   FUNCTIONAL TESTS:  5 times sit to stand: 33 seconds Timed up and go (TUG): 23 seconds                                                                                                                               TREATMENT DATE:  06/16/23 STM to R knee and surrounding structures  R knee patellar mobs NuStep l 5 x 6 min On airex alt 4in box taps x5 each, 6in x 5 each Side steps over body blade 3x5 each On airex ball toss Alt cone taps LLE LAQ 1.5lb 2x10   06/09/23 Discussed discharge planning with patient, talked with her about independent gym program, talked about HEP and her bike at home, talked about discipline and setting and making time for herself Nustep Level 5 x 6 minutes Rockerboard 2 ways balance needed CGA On airex reaching On airex ball toss Back to wall butt and scapular touches Cone toe touches Walking ball toss Bosu balance upside down  06/02/23 NuStep L5x50mins  Step ups 6"  HS curls 20# 2x1x0 Leg ext 5# 2x10  STS 2x10 Resisted gait 20# 3 way x3 each way   05/25/23 NuStep L 5 x 6 min HS curls 20lb 2x10 Leg Ext 5lb 2x10 Rows 7 lats 15lb 2x10 Chest press 5lb 2x10 Leg press 20lb 2x10  05/21/23 MMT and ROM checked and documents 5 x STS 23 sec-difficult and SOB TUG 12.5 sec Nustep L 5 UBE L 2 2 min fwd/2 min back HS curl 15 # 10x 20# 10 x LAQ 5# 2 sets 7 Seated row and Lat pull Down 15# 2 sets 10    05/19/23 NuStep L5 x 6 min 20lb resisted  forward and backward walking  LLE pain with backwards walking 4in step ups 2x5  STM to R knee and surrounding structures  R knee patellar mobs   05/14/23 Nustep L 5 7 min 3# standing hip flex ( bend knee and SL), hip ext and hip abd 10 x each 3# seated IR and ER 10  BIL 3# seated LAQ,hip flex and abd 10 x each 3# HHA marching fwd and back 10 feet 2 x each RT LE stretching and STW to medial HS incertion    05/12/23 Nustep level 5 x 6 minutes HS curls 15# 2x10 Feet on ball K2C, trunk rotation, small bridge, isometric abs Passive stretch LE's 3# hip abduction 3# hip extension Green tband clamshells  05/07/23 Nustep L 5 6  min HS curl 2 sets 10 green tband BIL Red tband hip abd and hip flexion 2 sets 10 BIL Red tband LAQ 2 sets 10 BIL HHA 3# marching fwd and backward 15 feet 2 times each then side stepping STM to R knee and surrounding structures- tender and tight HS  Seated HS stretch- issued to do at home   04/30/23 NuStep L 5 x 6 min GOALS MMT Knee   R Flex 4+/5,  Ext 5/5  L Flex 5/5, Ext 5/5 Sit to stands x10, x5, x5 4in step 2x5 each  STM to R knee and surrounding structures  R knee patellar mobs  04/28/23 STM to R knee and surrounding structures  R knee patellar mobs  R knee PROM NuStep L45 x 6 min Gait outdoors out the front door, around the back building up and down slopes, 4 total stand ing rest breaks to complete due to pulmonary limitations. PT ambulated wit her R and lightly grasping the inner L elbow of therapist LAQ 2lb 2x10  04/23/23 Nustep L 5 RT knee Act ext     -4    Pass 0 RT knee ext MMT 3+ with pain HS curls RLE green 2x10 RLE LAQ 2lb 2x10-visible shaking Standing RT LE hip flex,flex with ER,abd,ext 10 x 2 # S2S x10 ADD ball squeeze 20 x STM to R knee and surrounding structures-very tender and tight lateral knee and distal IT,also tender medial HS incertion  R knee patellar mobs  R knee PROM   04/21/23 NuStep L5 x 6 min MT  STM to R  knee and surrounding structures  R knee patellar mobs  R knee PROM HS curls RLE green 2x10 RLE 2lb 2x10  RLE LAQ 2lb 2x10 S2S x10, x7 ADD ball squeeze 20 x   04/16/23 Nustep L 5 RT knee Active ext sitting -5 with pain MMT RT knee ext 4- with pain and HS 4/5 STW to medial and lateral knee, jt mobs left knee for jt capsule stretching HS Curls green 2x10 LAQ 2# 2 x 10 2# hip flex and abd 2 sets 10 ADD ball squeeze 20 x Ionto RT medial knee 4 hour patch 1.2 cc dex   04/14/23 NuStep L 5 x 8 min Sit to stands 2x10  LAQ 2lb 2x10 Seated March 2lb 2x10 HS Curls green 2x10 4in step ups HHA x 1 x 10 each PATIENT EDUCATION:  Education details: POC and HEP Person educated: Patient Education method: Explanation Education comprehension: verbalized understanding  HOME EXERCISE PROGRAM: Standing Knee flexion at the counter, 2x10 Standing Marches at the counter, 2x10 Seated LAQ, 2x10  ASSESSMENT:  CLINICAL IMPRESSION: Pt enters ~ 7 min us  late. Treatment focused on balance, she did have difficulty with airex interventions,requiring CGA at times. Increase fatigue with alt cone taps and side steps on balance beam. No reports of pain during session  OBJECTIVE IMPAIRMENTS: Abnormal gait, cardiopulmonary status limiting activity, decreased balance, decreased coordination, decreased endurance, difficulty walking, decreased ROM, decreased strength, and pain.   ACTIVITY LIMITATIONS: carrying, lifting, bending, sitting, standing, squatting, stairs, transfers, bed mobility, and continence  PARTICIPATION LIMITATIONS: cleaning, laundry, and community activity  PERSONAL FACTORS: Age, Fitness, and Time since onset of injury/illness/exacerbation are also affecting patient's functional outcome.   REHAB POTENTIAL: Good  CLINICAL DECISION MAKING: Stable/uncomplicated  EVALUATION COMPLEXITY: Low   GOALS: Goals reviewed with patient? Yes  SHORT TERM GOALS: Target date: 04/15/23 Patient  will be independent with initial home program.  Baseline: Goal status: Met 03/24/23  2.  Pt will improve 5x STS to 25 seconds. Baseline: 33s Goal status: Met 17.3 sec 03/27/23  LONG TERM GOALS: Target date: 06/18/23  Pt will be independent with long term HEP for carryover and improvement of ADL difficulty. Baseline:  Goal status: progressing 04/16/23  evolving 04/23/23   Progressing 05/14/23 Progressing 05/21/23  2.  Pt will improve 5x STS to 20 seconds. Baseline: 33 sec (03/11/23) Goal status: Ongoing 03/26/23  04/16/23  18.71 MET  3.  Pt will improve TUG to 17 sec Baseline: 23 sec (03/11/23) Goal status: Met 15.62 03/26/23  4. Pt will improve knee strength to 4/5 or better Baseline: 3+/5 with pain Goal status: 04/16/23 met for HS  2/04/03/23 still limited and painful with ext, 04/30/23 Met   5. Patient will report decrease in pain levels to <2/10 or better  Baseline: 4-5/10  Goal status: ongoing 04/23/23, ongoing 5/10 04/30/23  05/07/23 progressing and 05/14/23 Progressing and Variable 05/21/23  6. Patient will achieve active terminal knee extension to 0 degrees  Baseline: -5 active extension   Goal status: 04/23/23   -4 act, 04/30/23 Met with LLE, R knee ext 2 degrees     PLAN:  PT FREQUENCY: 1x/week  PT DURATION: 4 weeks  PLANNED INTERVENTIONS: 97110-Therapeutic exercises, 97530- Therapeutic activity, 97112- Neuromuscular re-education, 97535- Self Care, and 82956- Manual therapy  PLAN FOR NEXT SESSION:Review independent program as needed, progress with functional strength Ollen Beverage, PTA

## 2023-06-23 ENCOUNTER — Ambulatory Visit

## 2023-06-23 DIAGNOSIS — M6281 Muscle weakness (generalized): Secondary | ICD-10-CM | POA: Diagnosis not present

## 2023-06-23 DIAGNOSIS — G8929 Other chronic pain: Secondary | ICD-10-CM

## 2023-06-23 DIAGNOSIS — R262 Difficulty in walking, not elsewhere classified: Secondary | ICD-10-CM

## 2023-06-23 NOTE — Therapy (Signed)
 OUTPATIENT PHYSICAL THERAPY LOWER EXTREMITY TREATMENT   Patient Name: Brooke Jimenez MRN: [id number] DOB:1945/06/14, 78 y.o., female Today's Date: 06/23/2023  END OF SESSION:  PT End of Session - 06/23/23 1544     Visit Number 24    Date for PT Re-Evaluation 06/23/23    PT Start Time 1545    PT Stop Time 1630    PT Time Calculation (min) 45 min    Activity Tolerance Patient tolerated treatment well    Behavior During Therapy WFL for tasks assessed/performed                Past Medical History:  Diagnosis Date   Acne rosacea    Anxiety    Arthritis    arthritis in hip    Bronchiectasis (HCC)    Cataract    beginning   Complication of anesthesia    "really sore throat"   Corneal dystrophy    DDD (degenerative disc disease), cervical    and lower back   Diverticulosis    Dysphagia    chronic- please see ultrasound done 4/16 15 in [facility]    Dysrhythmia    sometimes patient has extra beats per patient    Esophageal dysmotility    Family history of adverse reaction to anesthesia    mother slow to wake up    GERD (gastroesophageal reflux disease)    under control   Glaucoma    "suspect"   Gout    one finger x1 attack   H/O hiatal hernia    Hearing loss    bilateral due to nerve loss. wears hearing aides   Heart murmur    no problem    History of chicken pox    History of melanoma    melanoma- 1986 and 1997    Hypothyroidism    Low back pain with sciatica    Measles    hx of   Mitral valve prolapse    Nocturia    Osteoporosis    Peripheral neuropathy    feet   PONV (postoperative nausea and vomiting)    PTSD (post-traumatic stress disorder) 2006   RSD (reflex sympathetic dystrophy) 1993   Spider veins    Urinary incontinence    occasional   Varicose veins    Past Surgical History:  Procedure Laterality Date   ESOPHAGEAL MANOMETRY N/A 12/17/2015   Procedure: ESOPHAGEAL MANOMETRY (EM);  Surgeon: Sergio Dandy, MD;  Location: [facility];   Service: Endoscopy;  Laterality: N/A;   HARDWARE REMOVAL Left 10/17/2013   Procedure: HARDWARE REMOVAL LEFT HIP;  Surgeon: Aurther Blue, MD;  Location: [facility];  Service: Orthopedics;  Laterality: Left;   HIP FRACTURE SURGERY Left 2006   3 screws   INSERTION OF MESH  02/08/2014   Procedure: INSERTION OF MESH;  Surgeon: Adalberto Hollow, MD;  Location: [facility];  Service: General;;   melanoma surgery   1986, 1997   PARATHYROIDECTOMY  04/02/15   PH IMPEDANCE STUDY N/A 12/17/2015   Procedure: PH IMPEDANCE STUDY;  Surgeon: Sergio Dandy, MD;  Location: [facility];  Service: Endoscopy;  Laterality: N/A;   TOTAL HIP ARTHROPLASTY Left 04/10/2014   Procedure: LEFT TOTAL HIP ARTHROPLASTY ANTERIOR APPROACH;  Surgeon: Aurther Blue, MD;  Location: [facility];  Service: Orthopedics;  Laterality: Left;   UMBILICAL HERNIA REPAIR N/A 02/08/2014   Procedure: HERNIA REPAIR UMBILICAL ADULT WITH MESH;  Surgeon: Adalberto Hollow, MD;  Location: [facility];  Service: General;  Laterality: N/A;   Patient  Active Problem List   Diagnosis Date Noted   Reactive monocytosis 11/29/2021   Rosacea 11/29/2021   Upper respiratory infection 01/15/2018   Postherpetic neuralgia 12/25/2016   Dysesthesia 12/25/2016   Polyneuropathy 12/25/2016   Exposure to influenza 04/01/2016   Dysphagia    Bronchiectasis without acute exacerbation (HCC) 08/30/2015   Bronchiectasis (HCC) 08/14/2015   Chronic rhinitis 08/14/2015   Avascular necrosis of femur head, left (HCC) 04/10/2014   OA (osteoarthritis) of hip 04/10/2014   Hypothyroidism 12/09/2013   Painful orthopaedic hardware (HCC) 10/17/2013    PCP: Dr. Laureen Pon  REFERRING PROVIDER: Dr. Liliane Rei  REFERRING DIAG: Right knee pain with pes bursitis and chondromalacia patella  THERAPY DIAG:  Muscle weakness (generalized)  Chronic pain of left knee  Difficulty in walking, not elsewhere classified  Rationale for Evaluation and Treatment: Rehabilitation  ONSET DATE:  April 2024  SUBJECTIVE:   SUBJECTIVE STATEMENT: Doing okay. Today is my last day    PERTINENT HISTORY: Hip osteoarthritis Avascular necrosis of left femoral head DDD Dysrhythmia Gout Hearing loss LBP with Sciatica Osteoporosis Peripheral neuropathy (feet) Reflex Sympathetic Dystrophy PAIN:  Are you having pain? Yes: NPRS scale: 2/10 sitting, sometimes walking 6/10 Pain location: medial and lateral posterior of R knee Pain description: sharp Aggravating factors: bent for long time, steps, walking for a long period of time, standing for long periods, general movement Relieving factors: medication  PRECAUTIONS: Fall  RED FLAGS: None   WEIGHT BEARING RESTRICTIONS: No  FALLS:  Has patient fallen in last 6 months? Yes. Number of falls 1  LIVING ENVIRONMENT: Lives with: lives with their family and lives with their spouse Lives in: House/apartment Stairs: Yes: Internal: 1 flight steps; on right going up and External: 3 steps; on right going up Has following equipment at home: Single point cane, Walker - 2 wheeled, and Environmental consultant - 4 wheeled  OCCUPATION: retired  PLOF: Independent and Independent with basic ADLs  PATIENT GOALS: walk normally, gain confidence with gait  NEXT MD VISIT: none set   OBJECTIVE:  Note: Objective measures were completed at Evaluation unless otherwise noted.  DIAGNOSTIC FINDINGS: cartilage loss ine right knee  PATIENT SURVEYS:  FOTO 43  COGNITION: Overall cognitive status: Within functional limits for tasks assessed      POSTURE: rounded shoulders  PALPATION: Medial and Lateral right knee TTP  LOWER EXTREMITY ROM:  Active ROM Right eval Left eval RT  05/21/23  Hip flexion [facility]   Hip extension     Hip abduction     Hip adduction     Hip internal rotation     Hip external rotation     Knee flexion WFL* WFL   Knee extension Lack 25* WFL 0  Ankle dorsiflexion [facility]   Ankle plantarflexion [facility]   Ankle inversion      Ankle eversion      (Blank rows = not tested, * = pain)  LOWER EXTREMITY MMT:  MMT Right eval Left eval RT/Left  05/21/23  Hip flexion 4/5 4/5 4/4+  Hip extension     Hip abduction 4/5 4/5 4+  Hip adduction 4/5 4/5 4+  Hip internal rotation     Hip external rotation     Knee flexion 3+/5 4-/5 4+  Knee extension 3+/5* 4-/5 4+  Ankle dorsiflexion     Ankle plantarflexion     Ankle inversion     Ankle eversion      (Blank rows = not tested, * = pain  FUNCTIONAL TESTS:  5 times sit to stand: 33 seconds Timed up and go (TUG): 23 seconds                                                                                                                               TREATMENT DATE:  06/23/23 NuStep L5x49mins  HS curls 20# 2x10 Leg ext 5# 2x10 Seated row 15# 2x10 Chest press 5# 2x10 Leg press 20# 2x10   06/16/23 STM to R knee and surrounding structures  R knee patellar mobs NuStep l 5 x 6 min On airex alt 4in box taps x5 each, 6in x 5 each Side steps over body blade 3x5 each On airex ball toss Alt cone taps LLE LAQ 1.5lb 2x10   06/09/23 Discussed discharge planning with patient, talked with her about independent gym program, talked about HEP and her bike at home, talked about discipline and setting and making time for herself Nustep Level 5 x 6 minutes Rockerboard 2 ways balance needed CGA On airex reaching On airex ball toss Back to wall butt and scapular touches Cone toe touches Walking ball toss Bosu balance upside down  06/02/23 NuStep L5x17mins  Step ups 6"  HS curls 20# 2x1x0 Leg ext 5# 2x10  STS 2x10 Resisted gait 20# 3 way x3 each way   05/25/23 NuStep L 5 x 6 min HS curls 20lb 2x10 Leg Ext 5lb 2x10 Rows 7 lats 15lb 2x10 Chest press 5lb 2x10 Leg press 20lb 2x10  05/21/23 MMT and ROM checked and documents 5 x STS 23 sec-difficult and SOB TUG 12.5 sec Nustep L 5 UBE L 2 2 min fwd/2 min back HS curl 15 # 10x 20# 10 x LAQ 5# 2 sets 7 Seated  row and Lat pull Down 15# 2 sets 10    05/19/23 NuStep L5 x 6 min 20lb resisted forward and backward walking  LLE pain with backwards walking 4in step ups 2x5  STM to R knee and surrounding structures  R knee patellar mobs   05/14/23 Nustep L 5 7 min 3# standing hip flex ( bend knee and SL), hip ext and hip abd 10 x each 3# seated IR and ER 10  BIL 3# seated LAQ,hip flex and abd 10 x each 3# HHA marching fwd and back 10 feet 2 x each RT LE stretching and STW to medial HS incertion    05/12/23 Nustep level 5 x 6 minutes HS curls 15# 2x10 Feet on ball K2C, trunk rotation, small bridge, isometric abs Passive stretch LE's 3# hip abduction 3# hip extension Green tband clamshells  05/07/23 Nustep L 5 6  min HS curl 2 sets 10 green tband BIL Red tband hip abd and hip flexion 2 sets 10 BIL Red tband LAQ 2 sets 10 BIL HHA 3# marching fwd and backward 15 feet 2 times each then side stepping STM to R knee and surrounding structures- tender and tight HS  Seated HS  stretch- issued to do at home   04/30/23 NuStep L 5 x 6 min GOALS MMT Knee   R Flex 4+/5, Ext 5/5  L Flex 5/5, Ext 5/5 Sit to stands x10, x5, x5 4in step 2x5 each  STM to R knee and surrounding structures  R knee patellar mobs  04/28/23 STM to R knee and surrounding structures  R knee patellar mobs  R knee PROM NuStep L45 x 6 min Gait outdoors out the front door, around the back building up and down slopes, 4 total stand ing rest breaks to complete due to pulmonary limitations. PT ambulated wit her R and lightly grasping the inner L elbow of therapist LAQ 2lb 2x10  04/23/23 Nustep L 5 RT knee Act ext     -4    Pass 0 RT knee ext MMT 3+ with pain HS curls RLE green 2x10 RLE LAQ 2lb 2x10-visible shaking Standing RT LE hip flex,flex with ER,abd,ext 10 x 2 # S2S x10 ADD ball squeeze 20 x STM to R knee and surrounding structures-very tender and tight lateral knee and distal IT,also tender medial HS  incertion  R knee patellar mobs  R knee PROM   04/21/23 NuStep L5 x 6 min MT  STM to R knee and surrounding structures  R knee patellar mobs  R knee PROM HS curls RLE green 2x10 RLE 2lb 2x10  RLE LAQ 2lb 2x10 S2S x10, x7 ADD ball squeeze 20 x   04/16/23 Nustep L 5 RT knee Active ext sitting -5 with pain MMT RT knee ext 4- with pain and HS 4/5 STW to medial and lateral knee, jt mobs left knee for jt capsule stretching HS Curls green 2x10 LAQ 2# 2 x 10 2# hip flex and abd 2 sets 10 ADD ball squeeze 20 x Ionto RT medial knee 4 hour patch 1.2 cc dex   04/14/23 NuStep L 5 x 8 min Sit to stands 2x10  LAQ 2lb 2x10 Seated March 2lb 2x10 HS Curls green 2x10 4in step ups HHA x 1 x 10 each  PATIENT EDUCATION:  Education details: POC and HEP Person educated: Patient Education method: Explanation Education comprehension: verbalized understanding  HOME EXERCISE PROGRAM: Standing Knee flexion at the counter, 2x10 Standing Marches at the counter, 2x10 Seated LAQ, 2x10  ASSESSMENT:  CLINICAL IMPRESSION:  Treatment focused on reviewing independent gym program as today was her last session with PT. Still requires some cues to get in and out of machines and she has questions. I anticipate she will needs some help when she comes in but will improve with more exposure and practice.   OBJECTIVE IMPAIRMENTS: Abnormal gait, cardiopulmonary status limiting activity, decreased balance, decreased coordination, decreased endurance, difficulty walking, decreased ROM, decreased strength, and pain.   ACTIVITY LIMITATIONS: carrying, lifting, bending, sitting, standing, squatting, stairs, transfers, bed mobility, and continence  PARTICIPATION LIMITATIONS: cleaning, laundry, and community activity  PERSONAL FACTORS: Age, Fitness, and Time since onset of injury/illness/exacerbation are also affecting patient's functional outcome.   REHAB POTENTIAL: Good  CLINICAL DECISION MAKING:  Stable/uncomplicated  EVALUATION COMPLEXITY: Low   GOALS: Goals reviewed with patient? Yes  SHORT TERM GOALS: Target date: 04/15/23 Patient will be independent with initial home program.  Baseline: Goal status: Met 03/24/23  2.  Pt will improve 5x STS to 25 seconds. Baseline: 33s Goal status: Met 17.3 sec 03/27/23  LONG TERM GOALS: Target date: 06/18/23  Pt will be independent with long term HEP for carryover and improvement of ADL  difficulty. Baseline:  Goal status: progressing 04/16/23  evolving 04/23/23   Progressing 05/14/23 Progressing 05/21/23, MET 06/23/23  2.  Pt will improve 5x STS to 20 seconds. Baseline: 33 sec (03/11/23) Goal status: Ongoing 03/26/23   04/16/23  18.71 MET  3.  Pt will improve TUG to 17 sec Baseline: 23 sec (03/11/23) Goal status: Met 15.62 03/26/23  4. Pt will improve knee strength to 4/5 or better Baseline: 3+/5 with pain Goal status: 04/16/23 met for HS  2/04/03/23 still limited and painful with ext, 04/30/23 Met   5. Patient will report decrease in pain levels to <2/10 or better  Baseline: 4-5/10  Goal status: ongoing 04/23/23, ongoing 5/10 04/30/23  05/07/23 progressing and 05/14/23 Progressing and Variable 05/21/23, sitting still it is 2 or less, but if I stand or walk it does get up to 6 or 7 06/23/23  6. Patient will achieve active terminal knee extension to 0 degrees  Baseline: -5 active extension   Goal status: 04/23/23   -4 act, 04/30/23 Met with LLE, R knee ext 2 degrees, MET 06/23/23    PLAN:  PT FREQUENCY: 1x/week  PT DURATION: 4 weeks  PLANNED INTERVENTIONS: 97110-Therapeutic exercises, 97530- Therapeutic activity, 97112- Neuromuscular re-education, 97535- Self Care, and 09811- Manual therapy  PLAN FOR NEXT SESSION:Review independent program as needed, progress with functional strength  PHYSICAL THERAPY DISCHARGE SUMMARY  Visits from Start of Care: 24   Patient agrees to discharge. Patient goals were met. Patient is being discharged due to  being pleased with the current functional level.   Donavon Fudge, PT

## 2023-06-29 ENCOUNTER — Ambulatory Visit (INDEPENDENT_AMBULATORY_CARE_PROVIDER_SITE_OTHER): Payer: Medicare Other

## 2023-06-29 DIAGNOSIS — I4892 Unspecified atrial flutter: Secondary | ICD-10-CM

## 2023-06-29 LAB — CUP PACEART REMOTE DEVICE CHECK
Date Time Interrogation Session: 20250505090422
Implantable Pulse Generator Implant Date: 20231024
Pulse Gen Model: 5000
Pulse Gen Serial Number: 511011009

## 2023-07-07 ENCOUNTER — Ambulatory Visit: Payer: Self-pay | Admitting: Cardiovascular Disease

## 2023-07-09 NOTE — Addendum Note (Signed)
 Addended by: Edra Govern D on: 07/09/2023 11:33 AM   Modules accepted: Orders

## 2023-07-09 NOTE — Progress Notes (Signed)
 [address] [employer]

## 2023-07-30 ENCOUNTER — Ambulatory Visit (INDEPENDENT_AMBULATORY_CARE_PROVIDER_SITE_OTHER)

## 2023-07-30 ENCOUNTER — Ambulatory Visit: Payer: Self-pay | Admitting: Cardiovascular Disease

## 2023-07-30 DIAGNOSIS — I471 Supraventricular tachycardia, unspecified: Secondary | ICD-10-CM

## 2023-07-30 LAB — CUP PACEART REMOTE DEVICE CHECK
Date Time Interrogation Session: 20250605020605
Implantable Pulse Generator Implant Date: 20231024
Pulse Gen Model: 5000
Pulse Gen Serial Number: 511011009

## 2023-08-17 NOTE — Progress Notes (Signed)
 [address] [employer]

## 2023-08-31 ENCOUNTER — Ambulatory Visit

## 2023-08-31 DIAGNOSIS — I471 Supraventricular tachycardia, unspecified: Secondary | ICD-10-CM

## 2023-09-02 LAB — CUP PACEART REMOTE DEVICE CHECK
Date Time Interrogation Session: 20250707050533
Implantable Pulse Generator Implant Date: 20231024
Pulse Gen Model: 5000
Pulse Gen Serial Number: 511011009

## 2023-09-06 ENCOUNTER — Ambulatory Visit: Payer: Self-pay | Admitting: Cardiovascular Disease

## 2023-09-15 NOTE — Progress Notes (Signed)
 [facility] Summary Report / Loop Recorder

## 2023-10-01 ENCOUNTER — Ambulatory Visit

## 2023-10-01 DIAGNOSIS — I471 Supraventricular tachycardia, unspecified: Secondary | ICD-10-CM

## 2023-10-01 LAB — CUP PACEART REMOTE DEVICE CHECK
Date Time Interrogation Session: 20250807050411
Implantable Pulse Generator Implant Date: 20231024
Pulse Gen Model: 5000
Pulse Gen Serial Number: 511011009

## 2023-10-06 ENCOUNTER — Ambulatory Visit: Payer: Self-pay | Admitting: Cardiovascular Disease

## 2023-11-02 ENCOUNTER — Ambulatory Visit

## 2023-11-02 DIAGNOSIS — I471 Supraventricular tachycardia, unspecified: Secondary | ICD-10-CM

## 2023-11-03 LAB — CUP PACEART REMOTE DEVICE CHECK
Date Time Interrogation Session: 20250907050455
Implantable Pulse Generator Implant Date: 20231024
Pulse Gen Model: 5000
Pulse Gen Serial Number: 511011009

## 2023-11-12 ENCOUNTER — Ambulatory Visit: Payer: Self-pay | Admitting: Cardiovascular Disease

## 2023-11-12 NOTE — Progress Notes (Signed)
 Remote Loop Recorder Transmission

## 2023-11-21 NOTE — Progress Notes (Signed)
 Remote Loop Recorder Transmission

## 2023-12-03 ENCOUNTER — Ambulatory Visit

## 2023-12-03 DIAGNOSIS — I471 Supraventricular tachycardia, unspecified: Secondary | ICD-10-CM

## 2023-12-03 LAB — CUP PACEART REMOTE DEVICE CHECK
Date Time Interrogation Session: 20251008050122
Implantable Pulse Generator Implant Date: 20231024
Pulse Gen Model: 5000
Pulse Gen Serial Number: 511011009

## 2023-12-07 NOTE — Progress Notes (Shared)
 [facility] - Clinic Note  12/21/2023   CHIEF COMPLAINT Patient presents for No chief complaint on file.  HISTORY OF PRESENT ILLNESS: Brooke Jimenez is a 78 y.o. female who presents to the clinic today for:   Referring physician: Deane Camie HERO., MD [id number] [place],  [place] [address]  HISTORICAL INFORMATION:  Selected notes from the MEDICAL RECORD NUMBER Referred by Dr. JAMA:  Ocular Hx- PMH-   CURRENT MEDICATIONS: Current Outpatient Medications (Ophthalmic Drugs)  Medication Sig   sodium chloride  (MURO 128) 5 % ophthalmic ointment Muro 128 5 % eye ointment   No current facility-administered medications for this visit. (Ophthalmic Drugs)   Current Outpatient Medications (Other)  Medication Sig   calcium carbonate (TUMS - DOSED IN MG ELEMENTAL CALCIUM) 500 MG chewable tablet Chew 1 tablet by mouth daily as needed for indigestion or heartburn.   Cholecalciferol 50 MCG (2000 UT) CAPS Take 2 capsules by mouth daily. (Patient not taking: Reported on 12/17/2021)   doxycycline  (VIBRAMYCIN ) 100 MG capsule Take 1 capsule (100 mg total) by mouth 2 (two) times daily. (Patient taking differently: Take 100 mg by mouth as needed.)   levothyroxine  (SYNTHROID ) 50 MCG tablet Take 75 mcg by mouth daily.   metoprolol  succinate (TOPROL -XL) 25 MG 24 hr tablet Take 1 tablet (25 mg total) by mouth daily.   Multiple Vitamin (MULTIVITAMIN WITH MINERALS) TABS tablet Take 1 tablet by mouth daily.   No current facility-administered medications for this visit. (Other)   REVIEW OF SYSTEMS:  ALLERGIES Allergies  Allergen Reactions   Codeine Nausea And Vomiting and Other (See Comments)    DILAUDID  AND TRAMADOL  ARE OKAY.  Vomiting DILAUDID  AND TRAMADOL  ARE OKAY.  Vomiting DILAUDID  AND TRAMADOL  ARE OKAY.     Levofloxacin Nausea Only, Nausea And Vomiting and Other (See Comments)    Vomiting Vomiting    Levaquin [Levofloxacin In D5w] Nausea And Vomiting    Neosporin  [Neomycin-Bacitracin Zn-Polymyx]     Other reaction(s): Other (See Comments) Blisters, Erythema   Adhesive [Tape] Rash   Epinephrine  Nausea Only and Other (See Comments)    Heart pounds very hard Heart pounds very hard Heart pounds very hard Heart pounds very hard Heart pounds very hard    Erythromycin Other (See Comments), Nausea Only and Nausea And Vomiting    Abdominal pain  Abdominal pain  Abdominal pain  Abdominal pain    Guaifenesin  Nausea Only   Minocycline  Dermatitis    Skin discoloration on ankles per pt Skin discoloration on ankles per pt, but has had it since   Nickel Rash   Nsaids     Stomach aches   Sulfa Antibiotics Rash   PAST MEDICAL HISTORY Past Medical History:  Diagnosis Date   Acne rosacea    Anxiety    Arthritis    arthritis in hip    Bronchiectasis (HCC)    Cataract    beginning   Complication of anesthesia    really sore throat   Corneal dystrophy    DDD (degenerative disc disease), cervical    and lower back   Diverticulosis    Dysphagia    chronic- please see ultrasound done 4/16 15 in [facility]    Dysrhythmia    sometimes patient has extra beats per patient    Esophageal dysmotility    Family history of adverse reaction to anesthesia    mother slow to wake up    GERD (gastroesophageal reflux disease)    under  control   Glaucoma    suspect   Gout    one finger x1 attack   H/O hiatal hernia    Hearing loss    bilateral due to nerve loss. wears hearing aides   Heart murmur    no problem    History of chicken pox    History of melanoma    melanoma- 1986 and 1997    Hypothyroidism    Low back pain with sciatica    Measles    hx of   Mitral valve prolapse    Nocturia    Osteoporosis    Peripheral neuropathy    feet   PONV (postoperative nausea and vomiting)    PTSD (post-traumatic stress disorder) 2006   RSD (reflex sympathetic dystrophy) 1993   Spider veins    Urinary incontinence    occasional   Varicose  veins    Past Surgical History:  Procedure Laterality Date   ESOPHAGEAL MANOMETRY N/A 12/17/2015   Procedure: ESOPHAGEAL MANOMETRY (EM);  Surgeon: Gustav LULLA Mcgee, MD;  [facility];  Service: Endoscopy;  Laterality: N/A;   HARDWARE REMOVAL Left 10/17/2013   Procedure: HARDWARE REMOVAL LEFT HIP;  Surgeon: Dempsey Melodi LULLA, MD;  Location: [facility];  Service: Orthopedics;  Laterality: Left;   HIP FRACTURE SURGERY Left 2006   3 screws   INSERTION OF MESH  02/08/2014   Procedure: INSERTION OF MESH;  Surgeon: Krystal Russell, MD;  Location: [facility];  Service: General;;   melanoma surgery   1986, 1997   PARATHYROIDECTOMY  04/02/15   PH IMPEDANCE STUDY N/A 12/17/2015   Procedure: PH IMPEDANCE STUDY;  Surgeon: Gustav LULLA Mcgee, MD;  [facility]: [facility];  Service: Endoscopy;  Laterality: N/A;   TOTAL HIP ARTHROPLASTY Left 04/10/2014   Procedure: LEFT TOTAL HIP ARTHROPLASTY ANTERIOR APPROACH;  Surgeon: Dempsey Melodi LULLA, MD;  [facility]: [facility];  Service: Orthopedics;  Laterality: Left;   UMBILICAL HERNIA REPAIR N/A 02/08/2014   Procedure: HERNIA REPAIR UMBILICAL ADULT WITH MESH;  Surgeon: Krystal Russell, MD;  Location: [facility];  Service: General;  Laterality: N/A;   FAMILY HISTORY Family History  Problem Relation Age of Onset   Cancer Mother    Hypothyroidism Mother    Congestive Heart Failure Mother    Hypertension Mother    Heart failure Father    SOCIAL HISTORY Social History   Tobacco Use   Smoking status: Never   Smokeless tobacco: Never  Substance Use Topics   Alcohol use: No    Alcohol/week: 0.0 standard drinks of alcohol   Drug use: No       OPHTHALMIC EXAM:  Not recorded    IMAGING AND PROCEDURES  Imaging and Procedures for 12/21/2023        ASSESSMENT/PLAN:   ICD-10-CM   1. Retinal edema of both eyes  H35.81      1.  2.  3.  Ophthalmic Meds Ordered this visit:  No orders of the defined types were placed in this encounter.    No follow-ups  on file.  There are no Patient Instructions on file for this visit.  Explained the diagnoses, plan, and follow up with the patient and they expressed understanding.  Patient expressed understanding of the importance of proper follow up care.   This document serves as a record of services personally performed by Redell JUDITHANN Hans, MD, PhD. It was created on their behalf by Avelina Pereyra, COA an ophthalmic technician. The creation of this record is the provider's dictation  and/or activities during the visit.   Electronically signed by: Avelina GORMAN Pereyra, COT  12/21/23  7:24 AM   This document serves as a record of services personally performed by Redell JUDITHANN Hans, MD, PhD. It was created on their behalf by Wanda GEANNIE Keens, COT an ophthalmic technician. The creation of this record is the provider's dictation and/or activities during the visit.    Electronically signed by:  Wanda GEANNIE Keens, COT  12/21/23 7:24 AM  Redell JUDITHANN Hans, M.D., Ph.D. Diseases & Surgery of the Retina and Vitreous [facility] 12/21/2023  Abbreviations: M myopia (nearsighted); A astigmatism; H hyperopia (farsighted); P presbyopia; Mrx spectacle prescription;  CTL contact lenses; OD right eye; OS left eye; OU both eyes  XT exotropia; ET esotropia; PEK punctate epithelial keratitis; PEE punctate epithelial erosions; DES dry eye syndrome; MGD meibomian gland dysfunction; ATs artificial tears; PFAT's preservative free artificial tears; NSC nuclear sclerotic cataract; PSC posterior subcapsular cataract; ERM epi-retinal membrane; PVD posterior vitreous detachment; RD retinal detachment; DM diabetes mellitus; DR diabetic retinopathy; NPDR non-proliferative diabetic retinopathy; PDR proliferative diabetic retinopathy; CSME clinically significant macular edema; DME diabetic macular edema; dbh dot blot hemorrhages; CWS cotton wool spot; POAG primary open angle glaucoma; C/D cup-to-disc ratio; HVF humphrey visual  field; GVF goldmann visual field; OCT optical coherence tomography; IOP intraocular pressure; BRVO Branch retinal vein occlusion; CRVO central retinal vein occlusion; CRAO central retinal artery occlusion; BRAO branch retinal artery occlusion; RT retinal tear; SB scleral buckle; PPV pars plana vitrectomy; VH Vitreous hemorrhage; PRP panretinal laser photocoagulation; IVK intravitreal kenalog; VMT vitreomacular traction; MH Macular hole;  NVD neovascularization of the disc; NVE neovascularization elsewhere; AREDS age related eye disease study; ARMD age related macular degeneration; POAG primary open angle glaucoma; EBMD epithelial/anterior basement membrane dystrophy; ACIOL anterior chamber intraocular lens; IOL intraocular lens; PCIOL posterior chamber intraocular lens; Phaco/IOL phacoemulsification with intraocular lens placement; PRK photorefractive keratectomy; LASIK laser assisted in situ keratomileusis; HTN hypertension; DM diabetes mellitus; COPD chronic obstructive pulmonary disease

## 2023-12-07 NOTE — Progress Notes (Signed)
 Remote Loop Recorder Transmission

## 2023-12-08 NOTE — Progress Notes (Signed)
 Remote Loop Recorder Transmission

## 2023-12-10 ENCOUNTER — Ambulatory Visit: Payer: Self-pay | Admitting: Cardiovascular Disease

## 2023-12-21 ENCOUNTER — Encounter (INDEPENDENT_AMBULATORY_CARE_PROVIDER_SITE_OTHER): Admitting: Ophthalmology

## 2023-12-21 DIAGNOSIS — H3581 Retinal edema: Secondary | ICD-10-CM

## 2023-12-22 NOTE — Progress Notes (Addendum)
 [facility] - Clinic Note  12/28/2023   CHIEF COMPLAINT Patient presents for Retina Evaluation  HISTORY OF PRESENT ILLNESS: Brooke Jimenez is a 78 y.o. female who presents to the clinic today for:  HPI     Retina Evaluation   In both eyes.  Associated Symptoms Pain.  I, the attending physician,  performed the HPI with the patient and updated documentation appropriately.        Comments   Patient here for Retina evaluation. Referred by Dr. Gust. Patient states vision doing ok. Has eye pain-has dry eyes. Burns. Sinus-discomfort OU.       Last edited by Valdemar Rogue, MD on 01/03/2024  4:23 PM.    Patient states she has corneal dystrophy. She sees rays after having cataract surgery.   Referring physician: Gust Penna, MD [facility] [address] [place],  [place] [address]  HISTORICAL INFORMATION:  Selected notes from the MEDICAL RECORD NUMBER Referred by Dr. Gust for possible ARMD LEE:  Ocular Hx- PMH-   CURRENT MEDICATIONS: Current Outpatient Medications (Ophthalmic Drugs)  Medication Sig   sodium chloride  (MURO 128) 5 % ophthalmic ointment Muro 128 5 % eye ointment   No current facility-administered medications for this visit. (Ophthalmic Drugs)   Current Outpatient Medications (Other)  Medication Sig   levothyroxine  (SYNTHROID ) 50 MCG tablet Take 75 mcg by mouth daily.   metoprolol  succinate (TOPROL -XL) 25 MG 24 hr tablet Take 1 tablet (25 mg total) by mouth daily.   calcium carbonate (TUMS - DOSED IN MG ELEMENTAL CALCIUM) 500 MG chewable tablet Chew 1 tablet by mouth daily as needed for indigestion or heartburn.   Cholecalciferol 50 MCG (2000 UT) CAPS Take 2 capsules by mouth daily. (Patient not taking: Reported on 12/17/2021)   doxycycline  (VIBRAMYCIN ) 100 MG capsule Take 1 capsule (100 mg total) by mouth 2 (two) times daily. (Patient taking differently: Take 100 mg by mouth as needed.)   Multiple Vitamin (MULTIVITAMIN WITH  MINERALS) TABS tablet Take 1 tablet by mouth daily.   No current facility-administered medications for this visit. (Other)   REVIEW OF SYSTEMS: ROS   Positive for: Cardiovascular, Eyes Last edited by Orval Asberry RAMAN, COA on 12/28/2023  2:23 PM.     ALLERGIES Allergies  Allergen Reactions   Codeine Nausea And Vomiting and Other (See Comments)    DILAUDID  AND TRAMADOL  ARE OKAY.  Vomiting DILAUDID  AND TRAMADOL  ARE OKAY.  Vomiting DILAUDID  AND TRAMADOL  ARE OKAY.     Levofloxacin Nausea Only, Nausea And Vomiting and Other (See Comments)    Vomiting Vomiting    Levaquin [Levofloxacin In D5w] Nausea And Vomiting   Neosporin  [Neomycin-Bacitracin Zn-Polymyx]     Other reaction(s): Other (See Comments) Blisters, Erythema   Adhesive [Tape] Rash   Epinephrine  Nausea Only and Other (See Comments)    Heart pounds very hard Heart pounds very hard Heart pounds very hard Heart pounds very hard Heart pounds very hard    Erythromycin Other (See Comments), Nausea Only and Nausea And Vomiting    Abdominal pain  Abdominal pain  Abdominal pain  Abdominal pain    Guaifenesin  Nausea Only   Minocycline  Dermatitis    Skin discoloration on ankles per pt Skin discoloration on ankles per pt, but has had it since   Nickel Rash   Nsaids     Stomach aches   Sulfa Antibiotics Rash   PAST MEDICAL HISTORY Past Medical History:  Diagnosis Date   Acne rosacea  Anxiety    Arthritis    arthritis in hip    Bronchiectasis (HCC)    Cataract    beginning   Complication of anesthesia    really sore throat   Corneal dystrophy    DDD (degenerative disc disease), cervical    and lower back   Diverticulosis    Dysphagia    chronic- please see ultrasound done 4/16 15 in [facility]    Dysrhythmia    sometimes patient has extra beats per patient    Esophageal dysmotility    Family history of adverse reaction to anesthesia    mother slow to wake up    GERD (gastroesophageal reflux disease)     under control   Glaucoma    suspect   Gout    one finger x1 attack   H/O hiatal hernia    Hearing loss    bilateral due to nerve loss. wears hearing aides   Heart murmur    no problem    History of chicken pox    History of melanoma    melanoma- 1986 and 1997    Hypothyroidism    Low back pain with sciatica    Measles    hx of   Mitral valve prolapse    Nocturia    Osteoporosis    Peripheral neuropathy    feet   PONV (postoperative nausea and vomiting)    PTSD (post-traumatic stress disorder) 2006   RSD (reflex sympathetic dystrophy) 1993   Spider veins    Urinary incontinence    occasional   Varicose veins    Past Surgical History:  Procedure Laterality Date   ESOPHAGEAL MANOMETRY N/A 12/17/2015   Procedure: ESOPHAGEAL MANOMETRY (EM);  Surgeon: Gustav LULLA Mcgee, MD;  Location: [facility];  Service: Endoscopy;  Laterality: N/A;   HARDWARE REMOVAL Left 10/17/2013   Procedure: HARDWARE REMOVAL LEFT HIP;  Surgeon: Dempsey Melodi LULLA, MD;  Location: [facility];  Service: Orthopedics;  Laterality: Left;   HIP FRACTURE SURGERY Left 2006   3 screws   INSERTION OF MESH  02/08/2014   Procedure: INSERTION OF MESH;  Surgeon: Krystal Russell, MD;  Location: [facility];  Service: General;;   melanoma surgery   1986, 1997   PARATHYROIDECTOMY  04/02/15   PH IMPEDANCE STUDY N/A 12/17/2015   Procedure: PH IMPEDANCE STUDY;  Surgeon: Gustav LULLA Mcgee, MD;  Location: [facility];  Service: Endoscopy;  Laterality: N/A;   TOTAL HIP ARTHROPLASTY Left 04/10/2014   Procedure: LEFT TOTAL HIP ARTHROPLASTY ANTERIOR APPROACH;  Surgeon: Dempsey Melodi LULLA, MD;  Location: [facility];  Service: Orthopedics;  Laterality: Left;   UMBILICAL HERNIA REPAIR N/A 02/08/2014   Procedure: HERNIA REPAIR UMBILICAL ADULT WITH MESH;  Surgeon: Krystal Russell, MD;  Location: [facility];  Service: General;  Laterality: N/A;   FAMILY HISTORY Family History  Problem Relation Age of Onset   Cancer Mother    Hypothyroidism Mother     Congestive Heart Failure Mother    Hypertension Mother    Heart failure Father    SOCIAL HISTORY Social History   Tobacco Use   Smoking status: Never   Smokeless tobacco: Never  Vaping Use   Vaping status: Never Used  Substance Use Topics   Alcohol use: No    Alcohol/week: 0.0 standard drinks of alcohol   Drug use: No       OPHTHALMIC EXAM:  Base Eye Exam     Visual Acuity (Snellen - Linear)       Right  Left   Dist cc 20/25 +2 20/20 -1   Dist ph cc 20/20 -1     Correction: Glasses         Tonometry (Tonopen, 3:03 PM)       Right Left   Pressure 20 20         Pupils       Dark Light Shape React APD   Right 3 2 Round Brisk None   Left 3 2 Round Brisk None         Visual Fields (Counting fingers)       Left Right    Full Full         Extraocular Movement       Right Left    Full, Ortho Full, Ortho         Neuro/Psych     Oriented x3: Yes   Mood/Affect: Normal         Dilation     Both eyes: 1.0% Mydriacyl, 2.5% Phenylephrine  @ 2:18 PM           Slit Lamp and Fundus Exam     External Exam       Right Left   External Normal Normal         Slit Lamp Exam       Right Left   Lids/Lashes Dermatochalasis - upper lid, Meibomian gland dysfunction Dermatochalasis - upper lid, Meibomian gland dysfunction   Conjunctiva/Sclera White and quiet White and quiet   Cornea Well healed cataract wound, 1+ Punctate epithelial erosions, central haze, mild EBMD Well healed cataract wound, 2+ Punctate epithelial erosions, central haze, mild EBMD   Anterior Chamber Deep and clear Deep and clear   Iris Round and dilated Round and dilated   Lens PC IOL in good postition, trace Posterior capsular opacification PC IOL in good postition, 1+ Posterior capsular opacification   Anterior Vitreous Vitreous syneresis Vitreous syneresis, Posterior vitreous detachment         Fundus Exam       Right Left   Disc Pink and sharp, Compact, mild  tilt, PPA Pink and sharp, mild tilt, PPA   C/D Ratio 0.2 0.4   Macula Flat, Good foveal reflex, RPE mottling, No heme or edema Flat, Blunted foveal reflex, RPE mottling, No heme or edema   Vessels Vascular attenuation, Tortuous Vascular attenuation, Tortuous   Periphery Attached, no heme, pigmented cystoid degeneration, pigmented paving degeneration Attached, No heme           Refraction     Wearing Rx       Sphere Cylinder Axis Add   Right -0.25 +1.25 001 +2.25   Left -0.75 +2.75 157 +2.25           IMAGING AND PROCEDURES  Imaging and Procedures for 12/28/2023  OCT, Retina - OU - Both Eyes       Right Eye Quality was good. Central Foveal Thickness: 278. Progression has no prior data. Findings include normal foveal contour, no IRF, no SRF.   Left Eye Quality was good. Central Foveal Thickness: 280. Progression has no prior data. Findings include normal foveal contour, no IRF, no SRF, myopic contour, retinal drusen (Central drusen/ vitelliform like lesion).   Notes *Images captured and stored on drive  Diagnosis / Impression:  OD: WNL OS: Central drusen/ vitelliform like lesion  Clinical management:  See below  Abbreviations: NFP - Normal foveal profile. CME - cystoid macular edema. PED - pigment epithelial detachment. IRF - intraretinal fluid.  SRF - subretinal fluid. EZ - ellipsoid zone. ERM - epiretinal membrane. ORA - outer retinal atrophy. ORT - outer retinal tubulation. SRHM - subretinal hyper-reflective material. 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 Essential hypertension  I10     4. Hypertensive retinopathy of both eyes  H35.033 OCT, Retina - OU - Both Eyes    5. Pseudophakia  Z96.1     6. Corneal dystrophy  H18.509     7. 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 Retinal drusen OS  - focal central druse vs  vitelliform like lesion OS  - BCVA OS 20/20 -- asymptomatic  - discussed findings, prognosis  - no retinal or ophthalmic interventions indicated or recommended   - monitor  - pt can f/u here prn  2. PVD / vitreous syneresis, OU - Discussed findings and prognosis  - No RT or RD on 360 peripheral exam  - Reviewed s/s of RT/RD  - Strict return precautions for any such RT/RD signs/symptoms  - f/u PRN  3,4. Hypertensive retinopathy OU - discussed importance of tight BP control - monitor   5. Pseudophakia OU, PCO OS> OD  - s/p CE/IOL OU w/ Dr. Charmayne 2024  - IOL in good position, doing well  - monitor   6,7. Hx of corneal dystrophy and ocular rosacea, OU  - was seen by Dr. Charmayne and Dr. Hershal,  - recently saw Dr. Gust, recommended IPL laser, start Restasis and Doxycycline  (both of which she is not using)  Ophthalmic Meds Ordered this visit:  No orders of the defined types were placed in this encounter.    Return if symptoms worsen or fail to improve.  There are no Patient Instructions on file for this visit.  Explained the diagnoses, plan, and follow up with the patient and they expressed understanding.  Patient expressed understanding of the importance of proper follow up care.   This document serves as a record of services personally performed by Redell JUDITHANN Hans, MD, PhD. It was created on their behalf by Avelina Pereyra, COA an ophthalmic technician. The creation of this record is the provider's dictation and/or activities during the visit.   Electronically signed by: Avelina GORMAN Pereyra, COT  01/03/24  4:28 PM   This document serves as a record of services personally performed by Redell JUDITHANN Hans, MD, PhD. It was created on their behalf by Wanda GEANNIE Keens, COT an ophthalmic technician. The creation of this record is the provider's dictation and/or activities during the visit.    Electronically signed by:  Wanda GEANNIE Keens, COT  01/03/24 4:28 PM  Redell JUDITHANN Hans, M.D.,  Ph.D. Diseases & Surgery of the Retina and Vitreous [facility] 12/28/2023  I have reviewed the above documentation for accuracy and completeness, and I agree with the above. Redell JUDITHANN Hans, M.D., Ph.D. 01/03/24 4:28 PM   Abbreviations: M myopia (nearsighted); A astigmatism; H hyperopia (farsighted); P presbyopia; Mrx spectacle prescription;  CTL contact lenses; OD right eye; OS left eye; OU both eyes  XT exotropia; ET esotropia; PEK punctate epithelial keratitis; PEE punctate epithelial erosions; DES dry eye syndrome; MGD meibomian gland dysfunction; ATs artificial tears; PFAT's preservative free artificial tears; NSC nuclear sclerotic cataract; PSC posterior subcapsular cataract;  ERM epi-retinal membrane; PVD posterior vitreous detachment; RD retinal detachment; DM diabetes mellitus; DR diabetic retinopathy; NPDR non-proliferative diabetic retinopathy; PDR proliferative diabetic retinopathy; CSME clinically significant macular edema; DME diabetic macular edema; dbh dot blot hemorrhages; CWS cotton wool spot; POAG primary open angle glaucoma; C/D cup-to-disc ratio; HVF humphrey visual field; GVF goldmann visual field; OCT optical coherence tomography; IOP intraocular pressure; BRVO Branch retinal vein occlusion; CRVO central retinal vein occlusion; CRAO central retinal artery occlusion; BRAO branch retinal artery occlusion; RT retinal tear; SB scleral buckle; PPV pars plana vitrectomy; VH Vitreous hemorrhage; PRP panretinal laser photocoagulation; IVK intravitreal kenalog; VMT vitreomacular traction; MH Macular hole;  NVD neovascularization of the disc; NVE neovascularization elsewhere; AREDS age related eye disease study; ARMD age related macular degeneration; POAG primary open angle glaucoma; EBMD epithelial/anterior basement membrane dystrophy; ACIOL anterior chamber intraocular lens; IOL intraocular lens; PCIOL posterior chamber intraocular lens; Phaco/IOL phacoemulsification with  intraocular lens placement; PRK photorefractive keratectomy; LASIK laser assisted in situ keratomileusis; HTN hypertension; DM diabetes mellitus; COPD chronic obstructive pulmonary disease

## 2023-12-28 ENCOUNTER — Encounter (INDEPENDENT_AMBULATORY_CARE_PROVIDER_SITE_OTHER): Payer: Self-pay | Admitting: Ophthalmology

## 2023-12-28 ENCOUNTER — Ambulatory Visit (INDEPENDENT_AMBULATORY_CARE_PROVIDER_SITE_OTHER): Admitting: Ophthalmology

## 2023-12-28 DIAGNOSIS — Z961 Presence of intraocular lens: Secondary | ICD-10-CM

## 2023-12-28 DIAGNOSIS — H3581 Retinal edema: Secondary | ICD-10-CM

## 2023-12-28 DIAGNOSIS — H43393 Other vitreous opacities, bilateral: Secondary | ICD-10-CM

## 2023-12-28 DIAGNOSIS — I10 Essential (primary) hypertension: Secondary | ICD-10-CM | POA: Diagnosis not present

## 2023-12-28 DIAGNOSIS — H35033 Hypertensive retinopathy, bilateral: Secondary | ICD-10-CM | POA: Diagnosis not present

## 2023-12-28 DIAGNOSIS — H35362 Drusen (degenerative) of macula, left eye: Secondary | ICD-10-CM

## 2023-12-28 DIAGNOSIS — L718 Other rosacea: Secondary | ICD-10-CM

## 2023-12-28 DIAGNOSIS — H18509 Unspecified hereditary corneal dystrophies, unspecified eye: Secondary | ICD-10-CM

## 2024-01-03 ENCOUNTER — Encounter (INDEPENDENT_AMBULATORY_CARE_PROVIDER_SITE_OTHER): Payer: Self-pay | Admitting: Ophthalmology

## 2024-01-03 LAB — CUP PACEART REMOTE DEVICE CHECK
Date Time Interrogation Session: 20251109054157
Implantable Pulse Generator Implant Date: 20231024
Pulse Gen Model: 5000
Pulse Gen Serial Number: 511011009

## 2024-01-04 ENCOUNTER — Ambulatory Visit

## 2024-01-04 DIAGNOSIS — I471 Supraventricular tachycardia, unspecified: Secondary | ICD-10-CM | POA: Diagnosis not present

## 2024-01-06 ENCOUNTER — Ambulatory Visit: Payer: Self-pay | Admitting: Cardiovascular Disease

## 2024-01-07 NOTE — Progress Notes (Signed)
 Remote Loop Recorder Transmission

## 2024-02-04 ENCOUNTER — Ambulatory Visit: Attending: Cardiovascular Disease

## 2024-02-04 DIAGNOSIS — I4719 Other supraventricular tachycardia: Secondary | ICD-10-CM | POA: Diagnosis not present

## 2024-02-04 LAB — CUP PACEART REMOTE DEVICE CHECK
Date Time Interrogation Session: 20251211050032
Implantable Pulse Generator Implant Date: 20231024
Pulse Gen Model: 5000
Pulse Gen Serial Number: 511011009

## 2024-02-11 NOTE — Progress Notes (Signed)
 Remote Loop Recorder Transmission

## 2024-02-22 ENCOUNTER — Ambulatory Visit: Payer: Self-pay | Admitting: Cardiovascular Disease

## 2024-02-24 ENCOUNTER — Other Ambulatory Visit (HOSPITAL_COMMUNITY): Payer: Self-pay | Admitting: *Deleted

## 2024-02-24 DIAGNOSIS — J479 Bronchiectasis, uncomplicated: Secondary | ICD-10-CM

## 2024-03-05 ENCOUNTER — Ambulatory Visit (HOSPITAL_BASED_OUTPATIENT_CLINIC_OR_DEPARTMENT_OTHER): Admission: RE | Admit: 2024-03-05 | Discharge: 2024-03-05 | Attending: *Deleted | Admitting: *Deleted

## 2024-03-05 DIAGNOSIS — J479 Bronchiectasis, uncomplicated: Secondary | ICD-10-CM | POA: Diagnosis present

## 2024-03-06 ENCOUNTER — Ambulatory Visit

## 2024-03-06 ENCOUNTER — Ambulatory Visit (HOSPITAL_COMMUNITY)

## 2024-03-06 DIAGNOSIS — I4892 Unspecified atrial flutter: Secondary | ICD-10-CM | POA: Diagnosis not present

## 2024-03-07 LAB — CUP PACEART REMOTE DEVICE CHECK
Date Time Interrogation Session: 20260111052510
Implantable Pulse Generator Implant Date: 20231024
Pulse Gen Model: 5000
Pulse Gen Serial Number: 511011009

## 2024-03-08 NOTE — Progress Notes (Signed)
 Remote Loop Recorder Transmission

## 2024-03-09 ENCOUNTER — Other Ambulatory Visit: Payer: Self-pay

## 2024-03-09 MED ORDER — METOPROLOL SUCCINATE ER 25 MG PO TB24: 25.0000 mg | ORAL_TABLET | Freq: Every day | ORAL | 3 refills | Status: AC

## 2024-03-10 ENCOUNTER — Other Ambulatory Visit (HOSPITAL_COMMUNITY)

## 2024-03-11 ENCOUNTER — Ambulatory Visit: Payer: Self-pay | Admitting: Cardiovascular Disease

## 2024-04-06 ENCOUNTER — Ambulatory Visit

## 2024-05-07 ENCOUNTER — Ambulatory Visit
